# Patient Record
Sex: Female | Born: 1945
Health system: Southern US, Community
[De-identification: ages and names within clinical notes are randomized; demographics above are authoritative.]

## PROBLEM LIST (undated history)

## (undated) DIAGNOSIS — K5730 Diverticulosis of large intestine without perforation or abscess without bleeding: Secondary | ICD-10-CM

## (undated) DIAGNOSIS — K55039 Acute (reversible) ischemia of large intestine, extent unspecified: Secondary | ICD-10-CM

## (undated) DIAGNOSIS — F209 Schizophrenia, unspecified: Secondary | ICD-10-CM

## (undated) DIAGNOSIS — I2510 Atherosclerotic heart disease of native coronary artery without angina pectoris: Secondary | ICD-10-CM

## (undated) DIAGNOSIS — F329 Major depressive disorder, single episode, unspecified: Secondary | ICD-10-CM

## (undated) DIAGNOSIS — I951 Orthostatic hypotension: Secondary | ICD-10-CM

## (undated) DIAGNOSIS — I739 Peripheral vascular disease, unspecified: Secondary | ICD-10-CM

## (undated) DIAGNOSIS — I10 Essential (primary) hypertension: Secondary | ICD-10-CM

## (undated) DIAGNOSIS — E782 Mixed hyperlipidemia: Secondary | ICD-10-CM

## (undated) DIAGNOSIS — F32A Depression, unspecified: Secondary | ICD-10-CM

## (undated) DIAGNOSIS — E669 Obesity, unspecified: Secondary | ICD-10-CM

## (undated) DIAGNOSIS — E119 Type 2 diabetes mellitus without complications: Secondary | ICD-10-CM

## (undated) DIAGNOSIS — I7090 Unspecified atherosclerosis: Secondary | ICD-10-CM

## (undated) DIAGNOSIS — I493 Ventricular premature depolarization: Secondary | ICD-10-CM

## (undated) DIAGNOSIS — K559 Vascular disorder of intestine, unspecified: Secondary | ICD-10-CM

## (undated) DIAGNOSIS — I509 Heart failure, unspecified: Secondary | ICD-10-CM

## (undated) DIAGNOSIS — E039 Hypothyroidism, unspecified: Secondary | ICD-10-CM

## (undated) DIAGNOSIS — I340 Nonrheumatic mitral (valve) insufficiency: Secondary | ICD-10-CM

## (undated) DIAGNOSIS — M1990 Unspecified osteoarthritis, unspecified site: Secondary | ICD-10-CM

## (undated) DIAGNOSIS — T81710A Complication of mesenteric artery following a procedure, not elsewhere classified, initial encounter: Secondary | ICD-10-CM

## (undated) DIAGNOSIS — D369 Benign neoplasm, unspecified site: Secondary | ICD-10-CM

## (undated) DIAGNOSIS — F419 Anxiety disorder, unspecified: Secondary | ICD-10-CM

## (undated) DIAGNOSIS — G4730 Sleep apnea, unspecified: Secondary | ICD-10-CM

## (undated) DIAGNOSIS — I219 Acute myocardial infarction, unspecified: Secondary | ICD-10-CM

## (undated) HISTORY — PX: NECK SURGERY: SHX720

## (undated) HISTORY — PX: BACK SURGERY: SHX140

## (undated) HISTORY — DX: Atherosclerotic heart disease of native coronary artery without angina pectoris: I25.10

## (undated) HISTORY — DX: Complication of mesenteric artery following a procedure, not elsewhere classified, initial encounter: T81.710A

## (undated) HISTORY — PX: CARPAL TUNNEL RELEASE: SHX101

## (undated) HISTORY — DX: Obesity, unspecified: E66.9

## (undated) HISTORY — DX: Acute (reversible) ischemia of large intestine, extent unspecified: K55.039

## (undated) HISTORY — DX: Essential (primary) hypertension: I10

## (undated) HISTORY — DX: Depression, unspecified: F32.A

## (undated) HISTORY — DX: Ventricular premature depolarization: I49.3

## (undated) HISTORY — DX: Mixed hyperlipidemia: E78.2

## (undated) HISTORY — DX: Vascular disorder of intestine, unspecified: K55.9

## (undated) HISTORY — PX: CORONARY ARTERY BYPASS GRAFT: SHX141

## (undated) HISTORY — PX: OTHER SURGICAL HISTORY: SHX169

## (undated) HISTORY — DX: Diverticulosis of large intestine without perforation or abscess without bleeding: K57.30

## (undated) HISTORY — DX: Benign neoplasm, unspecified site: D36.9

## (undated) HISTORY — DX: Hypothyroidism, unspecified: E03.9

## (undated) HISTORY — DX: Unspecified atherosclerosis: I70.90

## (undated) HISTORY — DX: Orthostatic hypotension: I95.1

## (undated) HISTORY — DX: Major depressive disorder, single episode, unspecified: F32.9

## (undated) HISTORY — DX: Anxiety disorder, unspecified: F41.9

## (undated) HISTORY — DX: Type 2 diabetes mellitus without complications: E11.9

## (undated) HISTORY — PX: PARTIAL HYSTERECTOMY: SHX80

## (undated) HISTORY — DX: Nonrheumatic mitral (valve) insufficiency: I34.0

## (undated) HISTORY — DX: Peripheral vascular disease, unspecified: I73.9

## (undated) HISTORY — DX: Heart failure, unspecified: I50.9

---

## 1988-04-30 DIAGNOSIS — I219 Acute myocardial infarction, unspecified: Secondary | ICD-10-CM

## 1988-04-30 HISTORY — DX: Acute myocardial infarction, unspecified: I21.9

## 2001-08-20 ENCOUNTER — Ambulatory Visit (HOSPITAL_COMMUNITY): Admission: RE | Admit: 2001-08-20 | Discharge: 2001-08-20 | Payer: Self-pay | Admitting: Pediatrics

## 2001-08-20 ENCOUNTER — Encounter: Payer: Self-pay | Admitting: Pediatrics

## 2002-03-12 ENCOUNTER — Encounter: Payer: Self-pay | Admitting: Internal Medicine

## 2002-03-12 ENCOUNTER — Inpatient Hospital Stay (HOSPITAL_COMMUNITY): Admission: AD | Admit: 2002-03-12 | Discharge: 2002-03-14 | Payer: Self-pay | Admitting: Pediatrics

## 2002-07-02 ENCOUNTER — Ambulatory Visit (HOSPITAL_COMMUNITY): Admission: RE | Admit: 2002-07-02 | Discharge: 2002-07-02 | Payer: Self-pay | Admitting: *Deleted

## 2002-07-14 ENCOUNTER — Ambulatory Visit (HOSPITAL_COMMUNITY): Admission: RE | Admit: 2002-07-14 | Discharge: 2002-07-14 | Payer: Self-pay | Admitting: *Deleted

## 2002-07-15 ENCOUNTER — Ambulatory Visit (HOSPITAL_COMMUNITY): Admission: RE | Admit: 2002-07-15 | Discharge: 2002-07-17 | Payer: Self-pay | Admitting: Cardiology

## 2002-07-20 ENCOUNTER — Encounter (HOSPITAL_COMMUNITY): Admission: RE | Admit: 2002-07-20 | Discharge: 2002-08-19 | Payer: Self-pay | Admitting: *Deleted

## 2002-08-04 ENCOUNTER — Encounter (HOSPITAL_COMMUNITY): Admission: RE | Admit: 2002-08-04 | Discharge: 2002-09-03 | Payer: Self-pay | Admitting: *Deleted

## 2002-09-04 ENCOUNTER — Encounter (HOSPITAL_COMMUNITY): Admission: RE | Admit: 2002-09-04 | Discharge: 2002-10-04 | Payer: Self-pay | Admitting: *Deleted

## 2003-08-09 ENCOUNTER — Encounter (HOSPITAL_COMMUNITY): Admission: RE | Admit: 2003-08-09 | Discharge: 2003-09-08 | Payer: Self-pay | Admitting: Pediatrics

## 2003-09-16 ENCOUNTER — Inpatient Hospital Stay (HOSPITAL_COMMUNITY): Admission: EM | Admit: 2003-09-16 | Discharge: 2003-09-20 | Payer: Self-pay | Admitting: Emergency Medicine

## 2003-12-08 ENCOUNTER — Ambulatory Visit (HOSPITAL_COMMUNITY): Admission: RE | Admit: 2003-12-08 | Discharge: 2003-12-08 | Payer: Self-pay | Admitting: Family Medicine

## 2004-06-13 ENCOUNTER — Emergency Department (HOSPITAL_COMMUNITY): Admission: EM | Admit: 2004-06-13 | Discharge: 2004-06-13 | Payer: Self-pay | Admitting: Emergency Medicine

## 2004-06-14 ENCOUNTER — Ambulatory Visit (HOSPITAL_COMMUNITY): Admission: RE | Admit: 2004-06-14 | Discharge: 2004-06-15 | Payer: Self-pay | Admitting: Pediatrics

## 2004-06-16 ENCOUNTER — Ambulatory Visit: Payer: Self-pay | Admitting: *Deleted

## 2004-06-23 ENCOUNTER — Ambulatory Visit: Payer: Self-pay | Admitting: *Deleted

## 2004-06-29 ENCOUNTER — Ambulatory Visit: Payer: Self-pay | Admitting: Cardiology

## 2004-06-29 ENCOUNTER — Ambulatory Visit (HOSPITAL_COMMUNITY): Admission: RE | Admit: 2004-06-29 | Discharge: 2004-06-29 | Payer: Self-pay | Admitting: *Deleted

## 2004-07-03 ENCOUNTER — Ambulatory Visit: Payer: Self-pay | Admitting: *Deleted

## 2004-10-02 ENCOUNTER — Ambulatory Visit (HOSPITAL_COMMUNITY): Admission: RE | Admit: 2004-10-02 | Discharge: 2004-10-02 | Payer: Self-pay | Admitting: Pediatrics

## 2005-08-14 ENCOUNTER — Ambulatory Visit: Payer: Self-pay | Admitting: *Deleted

## 2005-09-10 ENCOUNTER — Inpatient Hospital Stay (HOSPITAL_COMMUNITY): Admission: EM | Admit: 2005-09-10 | Discharge: 2005-09-18 | Payer: Self-pay | Admitting: Emergency Medicine

## 2005-09-11 ENCOUNTER — Ambulatory Visit: Payer: Self-pay | Admitting: Pulmonary Disease

## 2005-09-11 ENCOUNTER — Ambulatory Visit: Payer: Self-pay | Admitting: *Deleted

## 2005-09-11 ENCOUNTER — Encounter: Payer: Self-pay | Admitting: Critical Care Medicine

## 2005-09-11 ENCOUNTER — Ambulatory Visit: Payer: Self-pay | Admitting: Internal Medicine

## 2005-09-11 DIAGNOSIS — K55039 Acute (reversible) ischemia of large intestine, extent unspecified: Secondary | ICD-10-CM

## 2005-09-11 HISTORY — DX: Acute (reversible) ischemia of large intestine, extent unspecified: K55.039

## 2005-09-14 ENCOUNTER — Encounter (INDEPENDENT_AMBULATORY_CARE_PROVIDER_SITE_OTHER): Payer: Self-pay | Admitting: *Deleted

## 2005-09-14 HISTORY — PX: COLONOSCOPY: SHX174

## 2005-09-18 ENCOUNTER — Ambulatory Visit: Payer: Self-pay | Admitting: Internal Medicine

## 2005-11-16 ENCOUNTER — Ambulatory Visit: Payer: Self-pay | Admitting: Internal Medicine

## 2005-11-19 ENCOUNTER — Ambulatory Visit: Admission: RE | Admit: 2005-11-19 | Discharge: 2005-11-19 | Payer: Self-pay | Admitting: Pulmonary Disease

## 2005-11-23 ENCOUNTER — Ambulatory Visit: Payer: Self-pay | Admitting: Pulmonary Disease

## 2006-03-14 ENCOUNTER — Ambulatory Visit: Payer: Self-pay | Admitting: Cardiology

## 2006-03-28 ENCOUNTER — Ambulatory Visit: Payer: Self-pay | Admitting: Cardiology

## 2006-04-03 ENCOUNTER — Ambulatory Visit: Payer: Self-pay | Admitting: Cardiology

## 2006-04-03 ENCOUNTER — Encounter (HOSPITAL_COMMUNITY): Admission: RE | Admit: 2006-04-03 | Discharge: 2006-04-29 | Payer: Self-pay | Admitting: Cardiology

## 2006-04-03 ENCOUNTER — Ambulatory Visit: Payer: Self-pay | Admitting: *Deleted

## 2006-04-09 ENCOUNTER — Ambulatory Visit: Payer: Self-pay | Admitting: Cardiology

## 2006-09-05 ENCOUNTER — Emergency Department (HOSPITAL_COMMUNITY): Admission: EM | Admit: 2006-09-05 | Discharge: 2006-09-05 | Payer: Self-pay | Admitting: Emergency Medicine

## 2006-12-02 ENCOUNTER — Emergency Department (HOSPITAL_COMMUNITY): Admission: EM | Admit: 2006-12-02 | Discharge: 2006-12-02 | Payer: Self-pay | Admitting: Emergency Medicine

## 2007-08-05 ENCOUNTER — Ambulatory Visit (HOSPITAL_COMMUNITY): Admission: RE | Admit: 2007-08-05 | Discharge: 2007-08-05 | Payer: Self-pay | Admitting: Pediatrics

## 2007-11-19 ENCOUNTER — Encounter: Payer: Self-pay | Admitting: Cardiology

## 2007-12-03 ENCOUNTER — Encounter: Payer: Self-pay | Admitting: Cardiology

## 2008-01-07 ENCOUNTER — Ambulatory Visit: Payer: Self-pay | Admitting: Cardiology

## 2008-01-14 ENCOUNTER — Ambulatory Visit: Payer: Self-pay | Admitting: Cardiology

## 2008-07-01 ENCOUNTER — Ambulatory Visit: Payer: Self-pay | Admitting: Cardiology

## 2008-10-06 ENCOUNTER — Encounter: Payer: Self-pay | Admitting: Cardiology

## 2009-01-19 DIAGNOSIS — I2510 Atherosclerotic heart disease of native coronary artery without angina pectoris: Secondary | ICD-10-CM | POA: Insufficient documentation

## 2009-01-19 DIAGNOSIS — E785 Hyperlipidemia, unspecified: Secondary | ICD-10-CM

## 2009-01-19 DIAGNOSIS — I10 Essential (primary) hypertension: Secondary | ICD-10-CM | POA: Insufficient documentation

## 2009-02-11 ENCOUNTER — Encounter (INDEPENDENT_AMBULATORY_CARE_PROVIDER_SITE_OTHER): Payer: Self-pay | Admitting: *Deleted

## 2009-02-11 ENCOUNTER — Ambulatory Visit: Payer: Self-pay | Admitting: Cardiology

## 2009-02-11 ENCOUNTER — Encounter: Payer: Self-pay | Admitting: Physician Assistant

## 2009-02-11 DIAGNOSIS — Z720 Tobacco use: Secondary | ICD-10-CM | POA: Insufficient documentation

## 2009-02-11 DIAGNOSIS — F17200 Nicotine dependence, unspecified, uncomplicated: Secondary | ICD-10-CM

## 2009-03-02 ENCOUNTER — Ambulatory Visit: Payer: Self-pay | Admitting: Cardiology

## 2009-03-03 ENCOUNTER — Encounter: Payer: Self-pay | Admitting: Cardiology

## 2009-03-18 ENCOUNTER — Ambulatory Visit: Payer: Self-pay | Admitting: Cardiology

## 2009-03-18 DIAGNOSIS — I080 Rheumatic disorders of both mitral and aortic valves: Secondary | ICD-10-CM | POA: Insufficient documentation

## 2009-05-03 ENCOUNTER — Encounter: Payer: Self-pay | Admitting: Cardiology

## 2009-05-04 ENCOUNTER — Encounter: Payer: Self-pay | Admitting: Cardiology

## 2009-05-06 ENCOUNTER — Ambulatory Visit: Payer: Self-pay | Admitting: Cardiology

## 2009-05-06 ENCOUNTER — Encounter (INDEPENDENT_AMBULATORY_CARE_PROVIDER_SITE_OTHER): Payer: Self-pay | Admitting: *Deleted

## 2009-05-06 DIAGNOSIS — R0609 Other forms of dyspnea: Secondary | ICD-10-CM

## 2009-05-10 ENCOUNTER — Encounter: Payer: Self-pay | Admitting: Cardiology

## 2009-05-13 ENCOUNTER — Inpatient Hospital Stay (HOSPITAL_BASED_OUTPATIENT_CLINIC_OR_DEPARTMENT_OTHER): Admission: RE | Admit: 2009-05-13 | Discharge: 2009-05-13 | Payer: Self-pay | Admitting: Cardiology

## 2009-05-13 ENCOUNTER — Ambulatory Visit: Payer: Self-pay | Admitting: Cardiology

## 2009-05-13 ENCOUNTER — Encounter: Payer: Self-pay | Admitting: Cardiology

## 2009-05-18 ENCOUNTER — Ambulatory Visit: Payer: Self-pay | Admitting: Cardiovascular Disease

## 2009-05-18 ENCOUNTER — Observation Stay (HOSPITAL_COMMUNITY): Admission: RE | Admit: 2009-05-18 | Discharge: 2009-05-19 | Payer: Self-pay | Admitting: Cardiovascular Disease

## 2009-05-18 ENCOUNTER — Encounter: Payer: Self-pay | Admitting: Cardiology

## 2009-05-19 ENCOUNTER — Encounter: Payer: Self-pay | Admitting: Cardiology

## 2009-06-23 ENCOUNTER — Ambulatory Visit: Payer: Self-pay | Admitting: Cardiology

## 2009-10-04 ENCOUNTER — Ambulatory Visit: Payer: Self-pay | Admitting: Cardiology

## 2009-10-04 DIAGNOSIS — I951 Orthostatic hypotension: Secondary | ICD-10-CM | POA: Insufficient documentation

## 2009-10-12 ENCOUNTER — Encounter: Payer: Self-pay | Admitting: Cardiology

## 2009-10-13 ENCOUNTER — Ambulatory Visit: Payer: Self-pay | Admitting: Physician Assistant

## 2009-10-13 DIAGNOSIS — I495 Sick sinus syndrome: Secondary | ICD-10-CM | POA: Insufficient documentation

## 2009-10-13 HISTORY — DX: Sick sinus syndrome: I49.5

## 2009-12-06 ENCOUNTER — Encounter: Payer: Self-pay | Admitting: Cardiology

## 2010-04-09 ENCOUNTER — Encounter: Payer: Self-pay | Admitting: Cardiology

## 2010-04-10 ENCOUNTER — Encounter: Payer: Self-pay | Admitting: Cardiology

## 2010-04-11 ENCOUNTER — Ambulatory Visit: Payer: Self-pay | Admitting: Cardiology

## 2010-04-12 ENCOUNTER — Encounter: Payer: Self-pay | Admitting: Cardiology

## 2010-05-04 ENCOUNTER — Ambulatory Visit
Admission: RE | Admit: 2010-05-04 | Discharge: 2010-05-04 | Payer: Self-pay | Source: Home / Self Care | Attending: Cardiology | Admitting: Cardiology

## 2010-06-01 NOTE — Letter (Signed)
Summary: External Correspondence/ OFFICE NOTE [facility]  External Correspondence/ OFFICE NOTE [facility]   Imported By: Dorise Hiss 12/13/2009 12:40:28  _____________________________________________________________________  External Attachment:    Type:   Image     Comment:   External Document

## 2010-06-01 NOTE — Assessment & Plan Note (Signed)
Summary: 1 WK F/U PER 6/7 OV-JM  Medications Added ASPIRIN 81 MG TBEC (ASPIRIN) Take one tablet by mouth daily GLYBURIDE 5 MG TABS (GLYBURIDE) Take 2 tablet by mouth twice a day      Allergies Added: NKDA  Visit Type:  Follow-up Primary Provider:  Dr. [name]   History of Present Illness: patient returns for scheduled early followup. She had recent adjustment of her medications, in light of documented orthostatic hypotension and complaint of dizziness. Norvasc was decreased to 5 mg daily, and patient was advised to take Lasix on an as-needed basis.   Follow labs drawn yesterday notable for potassium 4.5, creatinine 0.9, and normal CBC.  Clinically, patient reports complete resolution of her dizziness. In fact, she had no difficulty ambulating from her car to the office today, as she had last week. She continues to report no angina, since undergoing percutaneous intervention in January of this year. She is on Plavix and full dose aspirin, and does report easy bruisability.  Preventive Screening-Counseling & Management  Alcohol-Tobacco     Smoking Status: current     Smoking Cessation Counseling: yes     Year Quit: <1/3 PPD  Current Medications (verified): 1)  K-Tabs 10 Meq Cr-Tabs (Potassium Chloride) .... Take 1 Tablet By Mouth Once A Day As Needed With Lasix 2)  Lasix 40 Mg Tabs (Furosemide) .... Take 1 Tablet By Mouth Once A Day Use As Needed Only 3)  Aspirin 325 Mg Tabs (Aspirin) .... Take 1 Tablet By Mouth Once A Day 4)  Glyburide 5 Mg Tabs (Glyburide) .... Take 2 Tablet By Mouth Twice A Day 5)  Metoprolol Tartrate 50 Mg Tabs (Metoprolol Tartrate) .... Take 1 Tablet By Mouth Two Times A Day 6)  Pravastatin Sodium 80 Mg Tabs (Pravastatin Sodium) .... Take 1 Tablet By Mouth Once A Day 7)  Alprazolam 2 Mg Tabs (Alprazolam) .... Take 1 Tablet By Mouth Three Times A Day 8)  Constulose 10 Gm/24ml Soln (Lactulose) .... Use As Directed 9)  Hydrocodone-Acetaminophen 7.5-750 Mg  Tabs (Hydrocodone-Acetaminophen) .... Take 1 Tablet By Mouth Four Times A Day As Needed 10)  Amitriptyline Hcl 100 Mg Tabs (Amitriptyline Hcl) .... Take 2 Tablet By Mouth Once A Day At Bedtime 11)  Amlodipine Besylate 10 Mg Tabs (Amlodipine Besylate) .... Take 1/2 Tablet By Mouth Daily 12)  Isosorbide Mononitrate Cr 30 Mg Xr24h-Tab (Isosorbide Mononitrate) .... Take One Tablet By Mouth Daily 13)  Plavix 75 Mg Tabs (Clopidogrel Bisulfate) .... Take 1 Tablet By Mouth Once A Day 14)  Wellbutrin Sr 150 Mg Xr12h-Tab (Bupropion Hcl) .... Take 1 Tablet By Mouth Two Times A Day As Directed 15)  Gas-X Extra Strength 125 Mg Caps (Simethicone) .... As Needed  Allergies (verified): No Known Drug Allergies  Comments:  Nurse/Medical Assistant: The patient's medication list and allergies were reviewed with the patient and were updated in the Medication and Allergy Lists.  Past History:  Past Medical History: Mitral regurgitation, mild to moderate Diabetes mellitus Obesity Peripheral arterial disease History of ischemic colitis CAD - multivessel, LVEF 50-55%, DES circ 1/11, occluded SVG to OM and SVG to diagonal, LVEF 60% Hyperlipidemia Hypertension Orthostatic hypotension  Review of Systems       No fevers, chills, hemoptysis, dysphagia, melena, hematocheezia, hematuria, rash, claudication, orthopnea, pnd, pedal edema. All other systems negative.   Vital Signs:  Patient profile:   65 year old female Height:      64 inches Weight:      271 pounds Pulse  rate:   54 / minute BP sitting:   109 / 66  (left arm) Cuff size:   large  Vitals Entered By: Carlye Grippe (October 13, 2009 1:55 PM)  Physical Exam  Additional Exam:  GEN:65 year old female, morbidly obese, sitting upright, in no distress HEENT: NCAT,PERRLA,EOMI NECK: unable to assess JVD, secondary to neck girth; no bruits LUNGS: CTA bilaterally HEART: RRR (S1S2); no significant murmurs; no rubs; no gallops ABD: protuberant, soft,  NT; intact BS EXT: trace pedal edema SKIN: warm, dry MUSC: no obvious deformity NEURO: A/O (x3)     Impression & Recommendations:  Problem # 1:  ORTHOSTATIC HYPOTENSION (ICD-458.0) since resolved, following decrease in amlodipine and discontinuation of daily Lasix. Followup labs are within normal limits. No further medication adjustments indicated today. we'll schedule a return clinic visit with myself and Dr. Diona Browner in 6 months, or sooner if needed.  Problem # 2:  CAD, NATIVE VESSEL (ICD-414.01) quiesced on current medication regimen. Patient is to remain on Plavix for at least one year. We'll cut back on full dose aspirin to 81 mg daily, in light of reported easy bruisability.  Problem # 3:  SINUS BRADYCARDIA (ICD-427.81) patient is on metoprolol as the only agent affecting her heart rate. We'll continue current dose for now, but may need to decrease this in the future, if patient were to complain of recurrent dizziness with documented low heart rates.  Problem # 4:  TOBACCO USER (ICD-305.1) patient was again counseled regarding smoking cessation.  Problem # 5:  HYPERLIPIDEMIA-MIXED (ICD-272.4) aggressive management recommended with target LDL of 70 or less, if feasible. we'll request most recent lipid profile from Dr. Webb Laws office.  Patient Instructions: 1)  Your physician wants you to follow-up in: 6 months. You will receive a reminder letter in the mail one-two months in advance. If you don't receive a letter, please call our office to schedule the follow-up appointment. 2)  Decrease Aspirin to 81mg  by mouth once daily.  Appended Document: 1 WK F/U PER 6/7 OV-JM Spoke with Patty at Dr. Webb Laws office. Most recent FLP was done January 2011. A copy of this report is in EMR.

## 2010-06-01 NOTE — Consult Note (Signed)
Summary: CARDIOLOGY CONSULT/ [facility]  CARDIOLOGY CONSULT/ [facility]   Imported By: Zachary George 05/04/2010 09:45:57  _____________________________________________________________________  External Attachment:    Type:   Image     Comment:   External Document

## 2010-06-01 NOTE — Assessment & Plan Note (Signed)
Summary: [facility] POST OP CATH  Medications Added GLYBURIDE 5 MG TABS (GLYBURIDE) Take 2 tablet by mouth once a day PLAVIX 75 MG TABS (CLOPIDOGREL BISULFATE) Take 1 tablet by mouth once a day WELLBUTRIN SR 150 MG XR12H-TAB (BUPROPION HCL) Take 1 tablet by mouth two times a day as directed NICOTINE 21-14-7 MG/24HR KIT (NICOTINE) over-the counter patches as directed      Allergies Added: NKDA  Visit Type:  Follow-up Primary Provider:  Dr. [name]   History of Present Illness: 65 year old woman presents for a post cardiac catheterization followup. She recently underwent placement of a drug-eluting stent to the circumflex and obtuse marginal system in January by Dr. Clifton James, with findings of an occluded SVG to OM and occluded SVG to diagonal, with patent SVG to PDA and patent LIMA to LAD. LVEF was 60%. Otherwise medical therapy was recommended.  The patient had several questions about her cardiac catheterization procedure and findings. We reviewed these in great detail today. She was stuck on the fact that she was apparently told at [facility] that she had one stent placed in her circumflex, when in fact she had two based on our cardiac catheterization findings.   Ms. Finfrock continues to smoke cigarettes. I discussed with her the great importance of smoking cessation. We outlined strategies to consider.  We also reviewed the importance of medical therapy, and the natural progression of cardiovascular disease over time.   Current Medications (verified): 1)  K-Tabs 10 Meq Cr-Tabs (Potassium Chloride) .... Take 1 Tablet By Mouth Once A Day 2)  Lasix 40 Mg Tabs (Furosemide) .... Take 1 Tablet By Mouth Once A Day 3)  Aspirin 325 Mg Tabs (Aspirin) .... Take 1 Tablet By Mouth Once A Day 4)  Glyburide 5 Mg Tabs (Glyburide) .... Take 2 Tablet By Mouth Once A Day 5)  Metoprolol Tartrate 50 Mg Tabs (Metoprolol Tartrate) .... Take 1 Tablet By Mouth Two Times A Day 6)  Pravastatin Sodium 80 Mg Tabs  (Pravastatin Sodium) .... Take 1 Tablet By Mouth Once A Day 7)  Alprazolam 2 Mg Tabs (Alprazolam) .... Take 1 Tablet By Mouth Three Times A Day 8)  Constulose 10 Gm/61ml Soln (Lactulose) .... Use As Directed 9)  Hydrocodone-Acetaminophen 7.5-750 Mg Tabs (Hydrocodone-Acetaminophen) .... Take 1 Tablet By Mouth Four Times A Day As Needed 10)  Amitriptyline Hcl 100 Mg Tabs (Amitriptyline Hcl) .... Take 2 Tablet By Mouth Once A Day At Bedtime 11)  Amlodipine Besylate 10 Mg Tabs (Amlodipine Besylate) .... Take One Tablet By Mouth Daily 12)  Prevacid 30 Mg Cpdr (Lansoprazole) .... Take 1 Tablet By Mouth Once A Day 13)  Isosorbide Mononitrate Cr 30 Mg Xr24h-Tab (Isosorbide Mononitrate) .... Take One Tablet By Mouth Daily 14)  Plavix 75 Mg Tabs (Clopidogrel Bisulfate) .... Take 1 Tablet By Mouth Once A Day 15)  Wellbutrin Sr 150 Mg Xr12h-Tab (Bupropion Hcl) .... Take 1 Tablet By Mouth Two Times A Day As Directed  Allergies (verified): No Known Drug Allergies  Comments:  Nurse/Medical Assistant: The patient's medications and allergies were reviewed with the patient and were updated in the Medication and Allergy Lists. Bottles reviewed.  Past History:  Past Medical History: Last updated: 06/22/2009 Mitral regurgitation, mild to moderate Diabetes mellitus Obesity Peripheral arterial disease History of ischemic colitis CAD - multivessel, LVEF 50-55%, DES circ 1/11, occluded SVG to OM and SVG to diagonal, LVEF 60% Hyperlipidemia Hypertension  Past Surgical History: Last updated: 05/06/2009 Neck surgery Carpal tunnel surgery Partial hysterectomy Back Surgery CABG -  LIMA-LAD; SVG-RCA; SVG-OM; SVG-DX in [facility]) Carotid Endarterectomy  Social History: Last updated: 01/19/2009 Tobacco Use - Yes.  Disabled  Widowed  Alcohol Use - no Regular Exercise - yes Drug Use - no  Review of Systems       The patient complains of dyspnea on exertion.  The patient denies anorexia, fever, chest  pain, syncope, headaches, hemoptysis, melena, and hematochezia.         She complains of chronic arthritic back pain that limits her ambulation. No groin site complications. Otherwise reviewed and negative.  Vital Signs:  Patient profile:   65 year old female Height:      64 inches Weight:      274 pounds O2 Sat:      95 % Pulse rate:   78 / minute BP sitting:   113 / 66  (left arm) Cuff size:   large  Vitals Entered By: Carlye Grippe (June 23, 2009 3:59 PM)  Physical Exam  Additional Exam:  Morbidly obese woman in no acute distress. HEENT: Conjunctiva and lids normal, oropharynx with moist mucosa. Neck: Supple, no carotid bruits, no elevated jugular venous pressure. Lungs: Diminished breath sounds, nonlabored. No wheezing. Cardiac: Regular rate and rhythm, soft systolic murmur. No S3. Abdomen: Obese, nontender, no bruits. Skin: Warm and dry. Extremities: 1+ chronic appearing edema below the knees. Musculoskeletal: No gross deformities. Neuropsychiatric: Alert and oriented x3, affect appropriate.   EKG  Procedure date:  06/23/2009  Findings:      Normal sinus rhythm at 72 beats per minute with nonspecific ST-T wave changes.  Impression & Recommendations:  Problem # 1:  CAD, NATIVE VESSEL (ICD-414.01)  Multivessel cardiovascular disease with bypass graft disease as outlined above, now status post placement of a drug-eluting stent within the circumflex/OM system. Otherwise medical therapy is planned at this time. She is not reporting any anginal symptoms. We discussed the importance of weight loss, smoking cessation, medical therapy, and exercise. I plan to see her back over the next 3 months.  Her updated medication list for this problem includes:    Aspirin 325 Mg Tabs (Aspirin) .Marland Kitchen... Take 1 tablet by mouth once a day    Metoprolol Tartrate 50 Mg Tabs (Metoprolol tartrate) .Marland Kitchen... Take 1 tablet by mouth two times a day    Amlodipine Besylate 10 Mg Tabs (Amlodipine  besylate) .Marland Kitchen... Take one tablet by mouth daily    Isosorbide Mononitrate Cr 30 Mg Xr24h-tab (Isosorbide mononitrate) .Marland Kitchen... Take one tablet by mouth daily    Plavix 75 Mg Tabs (Clopidogrel bisulfate) .Marland Kitchen... Take 1 tablet by mouth once a day  Orders: EKG w/ Interpretation (93000)  Problem # 2:  TOBACCO USER (ICD-305.1)  Today we discussed the critical importance of smoking cessation. I have encouraged her to consider the matter carefully and pick a quit date. 2 weeks prior to this date she will begin Wellbutrin, and then use nicotine patches starting on the day that she stops.  Problem # 3:  HYPERTENSION, UNSPECIFIED (ICD-401.9)  Blood pressure well controlled today.  Her updated medication list for this problem includes:    Lasix 40 Mg Tabs (Furosemide) .Marland Kitchen... Take 1 tablet by mouth once a day    Aspirin 325 Mg Tabs (Aspirin) .Marland Kitchen... Take 1 tablet by mouth once a day    Metoprolol Tartrate 50 Mg Tabs (Metoprolol tartrate) .Marland Kitchen... Take 1 tablet by mouth two times a day    Amlodipine Besylate 10 Mg Tabs (Amlodipine besylate) .Marland Kitchen... Take one tablet by mouth daily  Problem # 4:  HYPERLIPIDEMIA-MIXED (ICD-272.4)  Will check a fasting lipid profile and liver function tests prior to her next visit.  Her updated medication list for this problem includes:    Pravastatin Sodium 80 Mg Tabs (Pravastatin sodium) .Marland Kitchen... Take 1 tablet by mouth once a day  Patient Instructions: 1)  Pick a day to stop smoking. Start Wellbutrin 150mg  by mouth two times a day 2 weeks before this date. On your quit day, continue wellbutrin and start the over-the-counter patches. These will be graded and you will start with 21mg  and progress to 14mg  then 7mg  as directed.  2)  Your physician recommends that you go to the [facility] for a FASTING lipid profile and liver function labs: in 3 months before next appointment. 3)  Your physician wants you to follow-up in: 3 months. You will receive a reminder letter in the mail one-two  months in advance. If you don't receive a letter, please call our office to schedule the follow-up appointment. Prescriptions: WELLBUTRIN SR 150 MG XR12H-TAB (BUPROPION HCL) Take 1 tablet by mouth two times a day as directed  #60 x 1   Entered by:   Cyril Loosen, RN, BSN   Authorized by:   Loreli Slot, MD, [facility]   Signed by:   Cyril Loosen, RN, BSN on 06/23/2009   Method used:   Electronically to        [facility] (retail)       [address]       [place], [place]  [address]       Ph: [phone]       Fax: [phone]   RxID:   [id number]   Handout requested.

## 2010-06-01 NOTE — Assessment & Plan Note (Signed)
Summary: 1 MO -SRS  Medications Added PREVACID 30 MG CPDR (LANSOPRAZOLE) Take 1 tablet by mouth once a day      Allergies Added: NKDA  Visit Type:  Follow-up Primary Provider:  Dr. [name]   History of Present Illness: 65 year old woman presents for followup of her daughter. She was last seen in the office back in November and follow up with cardiac testing, with known multivessel coronary artery disease status post remote coronary artery bypass grafting at [facility] Medication adjustments were made.  [name] continues to describe dyspnea on exertion at NYHA class III, with intermittent chest pain, and also increased reflux symptoms. She remains very worried about the status of her heart. She and her daughter have discussed the matter, and we reviewed the risks and benefits of a diagnostic cardiac catheterization to best understand her coronary anatomy. We also discussed the fact that her increased weight may also be leading to symptoms.  To review, echocardiography revealed normal left ventricular systolic function, and a Lexus scan Cardiolite did not indicate any clear ischemia, with small anterior defect that was most suggestive of attenuation. She did have mild to moderate mitral regurgitation with an RVSP of 33 mm mercury.  Preventive Screening-Counseling & Management  Alcohol-Tobacco     Smoking Status: current     Smoking Cessation Counseling: yes     Packs/Day: 1/2 PPD  Current Medications (verified): 1)  K-Tabs 10 Meq Cr-Tabs (Potassium Chloride) .... Take 1 Tablet By Mouth Once A Day 2)  Lasix 40 Mg Tabs (Furosemide) .... Take 1 Tablet By Mouth Once A Day 3)  Aspirin 325 Mg Tabs (Aspirin) .... Take 1 Tablet By Mouth Once A Day 4)  Glyburide 5 Mg Tabs (Glyburide) .... Take 1 Tablet By Mouth Once A Day 5)  Metoprolol Tartrate 50 Mg Tabs (Metoprolol Tartrate) .... Take 1 Tablet By Mouth Two Times A Day 6)  Pravastatin Sodium 80 Mg Tabs (Pravastatin Sodium) .... Take 1  Tablet By Mouth Once A Day 7)  Alprazolam 2 Mg Tabs (Alprazolam) .... Take 1 Tablet By Mouth Three Times A Day 8)  Constulose 10 Gm/88ml Soln (Lactulose) .... Use As Directed 9)  Hydrocodone-Acetaminophen 7.5-750 Mg Tabs (Hydrocodone-Acetaminophen) .... Take 1 Tablet By Mouth Four Times A Day As Needed 10)  Amitriptyline Hcl 100 Mg Tabs (Amitriptyline Hcl) .... Take 2 Tablet By Mouth Once A Day At Bedtime 11)  Amlodipine Besylate 10 Mg Tabs (Amlodipine Besylate) .... Take One Tablet By Mouth Daily 12)  Prevacid 30 Mg Cpdr (Lansoprazole) .... Take 1 Tablet By Mouth Once A Day 13)  Isosorbide Mononitrate Cr 30 Mg Xr24h-Tab (Isosorbide Mononitrate) .... Take One Tablet By Mouth Daily  Allergies (verified): No Known Drug Allergies  Comments:  Nurse/Medical Assistant: The patient's medications and allergies were reviewed with the patient and were updated in the Medication and Allergy Lists. Patient  stated nothing changed.  Past History:  Social History: Last updated: 01/19/2009 Tobacco Use - Yes.  Disabled  Widowed  Alcohol Use - no Regular Exercise - yes Drug Use - no  Past Medical History: Mitral regurgitation, mild to moderate Diabetes mellitus Obesity Peripheral arterial disease History of ischemic colitis CAD - multivessel, LVEF 50-55% Hyperlipidemia Hypertension  Past Surgical History: Neck surgery Carpal tunnel surgery Partial hysterectomy Back Surgery CABG - LIMA-LAD; SVG-RCA; SVG-OM; SVG-DX in 1995 [facility]) Carotid Endarterectomy  Family History: Family History of Diabetes, Hypertension  Social History: Packs/Day:  1/2 PPD  Review of Systems  The patient denies anorexia, fever,  hoarseness, syncope, peripheral edema, prolonged cough, headaches, hemoptysis, abdominal pain, melena, and hematochezia.         Otherwise negative except as outlined above.  Vital Signs:  Patient profile:   65 year old female Height:      64 inches Weight:      271  pounds Pulse rate:   65 / minute BP sitting:   125 / 76  (left arm) Cuff size:   large  Vitals Entered By: Carlye Grippe (May 06, 2009 10:53 AM)   Physical Exam  Additional Exam:  Morbidly obese woman in no acute distress. HEENT: Conjunctiva and lids normal, oropharynx with moist mucosa. Neck: Supple, no carotid bruits, no elevated jugular venous pressure. Lungs: Diminished breath sounds, nonlabored. No wheezing. Cardiac: Regular rate and rhythm, soft systolic murmur. No S3. Abdomen: Obese, nontender, no bruits. Skin: Warm and dry. Extremities: 1+ chronic appearing edema below the knees. Musculoskeletal: No gross deformities. Neuropsychiatric: Alert and oriented x3, affect appropriate.   Impression & Recommendations:  Problem # 1:  DYSPNEA (ICD-786.05)  Progressive shortness of breath with intermittent chest pain, in the setting of known multivessel cardiovascular disease as detailed above, normal left ventricular systolic function without frank ischemia by recent Cardiolite, mild to moderate mitral regurgitation, and morbid obesity. Medical therapy adjustments have been made with good blood pressure control, although continued symptoms, and increased concern on the part of the patient and her daughter. We discussed the risks and benefits of diagnostic cardiac catheterization (left and right heart) for further objective assessment. This will be arranged.  Her updated medication list for this problem includes:    Lasix 40 Mg Tabs (Furosemide) .Marland Kitchen... Take 1 tablet by mouth once a day    Aspirin 325 Mg Tabs (Aspirin) .Marland Kitchen... Take 1 tablet by mouth once a day    Metoprolol Tartrate 50 Mg Tabs (Metoprolol tartrate) .Marland Kitchen... Take 1 tablet by mouth two times a day    Amlodipine Besylate 10 Mg Tabs (Amlodipine besylate) .Marland Kitchen... Take one tablet by mouth daily  Problem # 2:  HYPERTENSION, UNSPECIFIED (ICD-401.9)  Blood pressure well controlled today. Continue present medications.  Her  updated medication list for this problem includes:    Lasix 40 Mg Tabs (Furosemide) .Marland Kitchen... Take 1 tablet by mouth once a day    Aspirin 325 Mg Tabs (Aspirin) .Marland Kitchen... Take 1 tablet by mouth once a day    Metoprolol Tartrate 50 Mg Tabs (Metoprolol tartrate) .Marland Kitchen... Take 1 tablet by mouth two times a day    Amlodipine Besylate 10 Mg Tabs (Amlodipine besylate) .Marland Kitchen... Take one tablet by mouth daily  Problem # 3:  CAD, NATIVE VESSEL (ICD-414.01)  Multivessel, status post coronary artery bypass grafting at [facility], anatomy outlined above.  Her updated medication list for this problem includes:    Aspirin 325 Mg Tabs (Aspirin) .Marland Kitchen... Take 1 tablet by mouth once a day    Metoprolol Tartrate 50 Mg Tabs (Metoprolol tartrate) .Marland Kitchen... Take 1 tablet by mouth two times a day    Amlodipine Besylate 10 Mg Tabs (Amlodipine besylate) .Marland Kitchen... Take one tablet by mouth daily    Isosorbide Mononitrate Cr 30 Mg Xr24h-tab (Isosorbide mononitrate) .Marland Kitchen... Take one tablet by mouth daily  Problem # 4:  TOBACCO USER (ICD-305.1)  Smoking cessation has been recommended.  Problem # 5:  MITRAL REGURGITATION (ICD-396.3)  Mild to moderate by recent echocardiogram.  Other Orders: T-Basic Metabolic Panel [phone]) T-CBC No Diff ([id number]) T-PTT ([id number]) T-Protime, Auto ([id number])  Patient Instructions: 1)  Left  and Right Heart Cath  2)  Follow up in  1 month.

## 2010-06-01 NOTE — Assessment & Plan Note (Signed)
Summary: 2 wk ful  Medications Added ALPRAZOLAM 2 MG TABS (ALPRAZOLAM) Take 1 tablet by mouth three times a day as needed HYDROCODONE-ACETAMINOPHEN 7.5-750 MG TABS (HYDROCODONE-ACETAMINOPHEN) take one by mouth every 4 hours as needed AMLODIPINE BESYLATE 5 MG TABS (AMLODIPINE BESYLATE) Take 1 tablet by mouth once a day ISOSORBIDE MONONITRATE CR 60 MG XR24H-TAB (ISOSORBIDE MONONITRATE) Take 1 tablet by mouth once a day MIRALAX  POWD (POLYETHYLENE GLYCOL 3350) 17gm mixed with 8oz water or juice by mouth daily METFORMIN HCL 500 MG TABS (METFORMIN HCL) Take 1 tablet by mouth two times a day NITROSTAT 0.4 MG SUBL (NITROGLYCERIN) use as needed chest pain      Allergies Added: NKDA  Visit Type:  Follow-up Primary Provider:  Dr. [name]   History of Present Illness: 65 year old woman presents for followup. I saw her back in June. More recently she was seen in consultation at [facility], having presented with chest pain. Discomfort had both typical and atypical features. She ruled out for myocardial infarction and was referred for a followup Cardiolite, reviewed below. Ischemic changes were noted in the setting of known CAD with graft disease and prior failed intervention to the proximal LAD. Medical therapy adjustments were made.  Prior lipid profile from August 2011 revealed total cholesterol 127, triglycerides 136, HDL 35, LDL 65.  She presents today for followup. She states that she has had additional episodes of angina, although quickly resolved with one sublingual nitroglycerin. She continues to smoke cigarettes. I reviewed her medications today, which look quite good overall, and note that her heart rate and blood pressure are well controlled at baseline.   I discussed with her the implications of her recent stress test findings, and we reviewed her prior evidence of both graft and native vessel disease. We discussed smoking cessation once again, and also the possibility of considering a  repeat diagnostic cardiac catheterization to see if there are any potential revascularization strategies to be considered, realizing that there may be no simple approaches. At this particular time, she wanted to consider the matter further and see how she does over the next few weeks.  Preventive Screening-Counseling & Management  Alcohol-Tobacco     Smoking Status: current     Smoking Cessation Counseling: yes     Packs/Day: <1 PPD  Current Medications (verified): 1)  K-Tabs 10 Meq Cr-Tabs (Potassium Chloride) .... Take 1 Tablet By Mouth Once A Day As Needed With Lasix 2)  Lasix 40 Mg Tabs (Furosemide) .... Take 1 Tablet By Mouth Once A Day Use As Needed Only 3)  Aspirin 81 Mg Tbec (Aspirin) .... Take One Tablet By Mouth Daily 4)  Glyburide 5 Mg Tabs (Glyburide) .... Take 2 Tablet By Mouth Twice A Day 5)  Metoprolol Tartrate 50 Mg Tabs (Metoprolol Tartrate) .... Take 1 Tablet By Mouth Two Times A Day 6)  Pravastatin Sodium 80 Mg Tabs (Pravastatin Sodium) .... Take 1 Tablet By Mouth Once A Day 7)  Alprazolam 2 Mg Tabs (Alprazolam) .... Take 1 Tablet By Mouth Three Times A Day As Needed 8)  Constulose 10 Gm/76ml Soln (Lactulose) .... Use As Directed 9)  Hydrocodone-Acetaminophen 7.5-750 Mg Tabs (Hydrocodone-Acetaminophen) .... Take One By Mouth Every 4 Hours As Needed 10)  Amitriptyline Hcl 100 Mg Tabs (Amitriptyline Hcl) .... Take 2 Tablet By Mouth Once A Day At Bedtime 11)  Amlodipine Besylate 5 Mg Tabs (Amlodipine Besylate) .... Take 1 Tablet By Mouth Once A Day 12)  Isosorbide Mononitrate Cr 60 Mg Xr24h-Tab (Isosorbide Mononitrate) .Marland KitchenMarland KitchenMarland Kitchen  Take 1 Tablet By Mouth Once A Day 13)  Plavix 75 Mg Tabs (Clopidogrel Bisulfate) .... Take 1 Tablet By Mouth Once A Day 14)  Gas-X Extra Strength 125 Mg Caps (Simethicone) .... As Needed 15)  Miralax  Powd (Polyethylene Glycol 3350) .Marland Kitchen.. 17gm Mixed With 8oz Water or Juice By Mouth Daily 16)  Metformin Hcl 500 Mg Tabs (Metformin Hcl) .... Take 1 Tablet By  Mouth Two Times A Day 17)  Nitrostat 0.4 Mg Subl (Nitroglycerin) .... Use As Needed Chest Pain  Allergies (verified): No Known Drug Allergies  Comments:  Nurse/Medical Assistant: The patient's medication bottles and allergies were reviewed with the patient and were updated in the Medication and Allergy Lists.  Past History:  Past Medical History: Last updated: 10/13/2009 Mitral regurgitation, mild to moderate Diabetes mellitus Obesity Peripheral arterial disease History of ischemic colitis CAD - multivessel, LVEF 50-55%, DES circ 1/11, occluded SVG to OM and SVG to diagonal, LVEF 60% Hyperlipidemia Hypertension Orthostatic hypotension  Past Surgical History: Last updated: 05/06/2009 Neck surgery Carpal tunnel surgery Partial hysterectomy Back Surgery CABG - LIMA-LAD; SVG-RCA; SVG-OM; SVG-DX in 1995 [facility]) Carotid Endarterectomy  Social History: Last updated: 01/19/2009 Tobacco Use - Yes.  Disabled  Widowed  Alcohol Use - no Regular Exercise - yes Drug Use - no  Social History: Packs/Day:  <1 PPD  Review of Systems       The patient complains of chest pain and dyspnea on exertion.  The patient denies anorexia, fever, weight loss, syncope, peripheral edema, headaches, melena, and hematochezia.         Recently hit her eye on a fireplace door with resulting ecchymosis on the right. Otherwise reviewed and negative except as outlined above.  Vital Signs:  Patient profile:   65 year old female Height:      64 inches Weight:      269 pounds BMI:     46.34 Pulse rate:   59 / minute BP sitting:   128 / 70  (left arm) Cuff size:   large  Vitals Entered By: Carlye Grippe (May 04, 2010 11:40 AM)  Nutrition Counseling: Patient's BMI is greater than 25 and therefore counseled on weight management options.  Physical Exam  Additional Exam:  Morbidly obese chronically ill-appearing woman in no acute distress. HEENT: Conjunctiva and lids are grossly normal with  ecchymosis on the right, oropharynx with poor dentition. Neck: Supple, no obvious elevation in JVP, no bruits. Lungs: Diminished breath sounds without wheezing or labored breathing. Cardiac: Regular rate and rhythm, no S3 or loud systolic murmur. Abdomen: Soft, nontender, bowel sounds present Skin: Warm and dry. Extremities: No pitting edema, distal pulses one plus. Musculoskeletal: No kyphosis. Neuropsychiatric: Alert and oriented x3, affect grossly appropriate.   Nuclear Study  Procedure date:  04/10/2010  Findings:      Lexascan Cardiolite, no diagnostic ST changes, no chest pain. Perfusion imaging reveals LVEF 69%, t.i.d. ratio 1.2, ischemia noted within the anteroapical segment and basal inferolateral segment assisted with clear component of breast attenuation.  Impression & Recommendations:  Problem # 1:  CAD, NATIVE VESSEL (ICD-414.01)  At this point plan continued medical therapy for treatment of angina with known history of graft and native vessel disease, status post most recent intervention in January of last year with drug-eluting stent placement to the circumflex. She continues on dual antiplatelet therapy. I plan to see her back over the next 4 weeks, sooner if needed.  Her updated medication list for this problem includes:  Aspirin 81 Mg Tbec (Aspirin) .Marland Kitchen... Take one tablet by mouth daily    Metoprolol Tartrate 50 Mg Tabs (Metoprolol tartrate) .Marland Kitchen... Take 1 tablet by mouth two times a day    Amlodipine Besylate 5 Mg Tabs (Amlodipine besylate) .Marland Kitchen... Take 1 tablet by mouth once a day    Isosorbide Mononitrate Cr 60 Mg Xr24h-tab (Isosorbide mononitrate) .Marland Kitchen... Take 1 tablet by mouth once a day    Plavix 75 Mg Tabs (Clopidogrel bisulfate) .Marland Kitchen... Take 1 tablet by mouth once a day    Nitrostat 0.4 Mg Subl (Nitroglycerin) ..... Use as needed chest pain  Problem # 2:  TOBACCO USER (ICD-305.1)  I again discussed the critical importance of smoking cessation for her  health.  Problem # 3:  HYPERTENSION, UNSPECIFIED (ICD-401.9)  Blood pressure well controlled today.  Her updated medication list for this problem includes:    Lasix 40 Mg Tabs (Furosemide) .Marland Kitchen... Take 1 tablet by mouth once a day use as needed only    Aspirin 81 Mg Tbec (Aspirin) .Marland Kitchen... Take one tablet by mouth daily    Metoprolol Tartrate 50 Mg Tabs (Metoprolol tartrate) .Marland Kitchen... Take 1 tablet by mouth two times a day    Amlodipine Besylate 5 Mg Tabs (Amlodipine besylate) .Marland Kitchen... Take 1 tablet by mouth once a day  Problem # 4:  MITRAL REGURGITATION (ICD-396.3)  Mild to moderate.  Patient Instructions: 1)  Your physician recommends that you continue on your current medications as directed. Please refer to the Current Medication list given to you today. 2)  Follow up in  1 month

## 2010-06-01 NOTE — Letter (Signed)
Summary: [facility] D[facility]C DR. Pleas Patricia  [facility] D[facility]C DR. Casimer Bilis [place]   Imported By: Zachary George 05/04/2010 09:40:38  _____________________________________________________________________  External Attachment:    Type:   Image     Comment:   External Document

## 2010-06-01 NOTE — Assessment & Plan Note (Signed)
Summary: 3 MO FU PER JUNE REMINDER-SRS  Medications Added K-TABS 10 MEQ CR-TABS (POTASSIUM CHLORIDE) Take 1 tablet by mouth once a day as needed with Lasix LASIX 40 MG TABS (FUROSEMIDE) Take 1 tablet by mouth once a day use as needed only AMLODIPINE BESYLATE 10 MG TABS (AMLODIPINE BESYLATE) Take 1/2 tablet by mouth daily GAS-X EXTRA STRENGTH 125 MG CAPS (SIMETHICONE) as needed      Allergies Added: NKDA  Visit Type:  Follow-up Primary Provider:  Dr. [name]   History of Present Illness: 65 year old woman presents for followup. She is status post placement of a drug-eluting stent to the circumflex and obtuse marginal system in January by Dr. Clifton James, with findings of an occluded SVG to OM and occluded SVG to diagonal, with patent SVG to PDA and patent LIMA to LAD. LVEF was 60%.  She presents for a followup visit. She continues to deny any significant angina. She does report feeling weak, and has described some orthostatic lightheadedness over the last several weeks. She has had no palpitations or syncope.  She is using Wellbutrin SR, and states that she has been able to cut back her cigarette use to "3 a day." She is hopeful that she will be able to quit altogether in the near future.  She reports compliance with her medications, including diuretic therapy which she uses for lower extremity edema long term. She states that diuretics typically have not made much of a difference with her lower extremity edema, which seems to be most likely related to venous stasis. She does indicate that elevating her legs seems to help. She reports a stable appetite.   Preventive Screening-Counseling & Management  Alcohol-Tobacco     Smoking Status: current     Smoking Cessation Counseling: yes     Packs/Day: <1/3 PPD  Current Medications (verified): 1)  K-Tabs 10 Meq Cr-Tabs (Potassium Chloride) .... Take 1 Tablet By Mouth Once A Day 2)  Lasix 40 Mg Tabs (Furosemide) .... Take 1 Tablet By Mouth  Once A Day 3)  Aspirin 325 Mg Tabs (Aspirin) .... Take 1 Tablet By Mouth Once A Day 4)  Glyburide 5 Mg Tabs (Glyburide) .... Take 2 Tablet By Mouth Once A Day 5)  Metoprolol Tartrate 50 Mg Tabs (Metoprolol Tartrate) .... Take 1 Tablet By Mouth Two Times A Day 6)  Pravastatin Sodium 80 Mg Tabs (Pravastatin Sodium) .... Take 1 Tablet By Mouth Once A Day 7)  Alprazolam 2 Mg Tabs (Alprazolam) .... Take 1 Tablet By Mouth Three Times A Day 8)  Constulose 10 Gm/80ml Soln (Lactulose) .... Use As Directed 9)  Hydrocodone-Acetaminophen 7.5-750 Mg Tabs (Hydrocodone-Acetaminophen) .... Take 1 Tablet By Mouth Four Times A Day As Needed 10)  Amitriptyline Hcl 100 Mg Tabs (Amitriptyline Hcl) .... Take 2 Tablet By Mouth Once A Day At Bedtime 11)  Amlodipine Besylate 10 Mg Tabs (Amlodipine Besylate) .... Take One Tablet By Mouth Daily 12)  Prevacid 30 Mg Cpdr (Lansoprazole) .... Take 1 Tablet By Mouth Once A Day 13)  Isosorbide Mononitrate Cr 30 Mg Xr24h-Tab (Isosorbide Mononitrate) .... Take One Tablet By Mouth Daily 14)  Plavix 75 Mg Tabs (Clopidogrel Bisulfate) .... Take 1 Tablet By Mouth Once A Day 15)  Wellbutrin Sr 150 Mg Xr12h-Tab (Bupropion Hcl) .... Take 1 Tablet By Mouth Two Times A Day As Directed 16)  Nicotine 21-14-7 Mg/24hr Kit (Nicotine) .... Over-The Counter Patches As Directed  Allergies (verified): No Known Drug Allergies  Past History:  Past Medical History: Last updated:  06/22/2009 Mitral regurgitation, mild to moderate Diabetes mellitus Obesity Peripheral arterial disease History of ischemic colitis CAD - multivessel, LVEF 50-55%, DES circ 1/11, occluded SVG to OM and SVG to diagonal, LVEF 60% Hyperlipidemia Hypertension  Past Surgical History: Last updated: 05/06/2009 Neck surgery Carpal tunnel surgery Partial hysterectomy Back Surgery CABG - LIMA-LAD; SVG-RCA; SVG-OM; SVG-DX in 1995 [facility]) Carotid Endarterectomy  Social History: Last updated: 01/19/2009 Tobacco Use -  Yes.  Disabled  Widowed  Alcohol Use - no Regular Exercise - yes Drug Use - no  Social History: Packs/Day:  <1/3 PPD  Review of Systems       The patient complains of peripheral edema.  The patient denies anorexia, fever, weight gain, chest pain, syncope, prolonged cough, headaches, hemoptysis, melena, and hematochezia.         She states she had an episode of perioral numbness and tingling after standing up several weeks ago. She states that her tongue felt "thick" as she had difficulty speaking, although this has resolved by her report. She had no palpitations or syncope at that time. No focal motor deficits are described. Otherwise reviewed and negative.  Vital Signs:  Patient profile:   65 year old female Height:      64 inches Weight:      272 pounds Pulse rate:   66 / minute Pulse (ortho):   108 / minute BP sitting:   98 / 62  (left arm) BP standing:   93 / 59 Cuff size:   large  Vitals Entered By: Carlye Grippe (October 04, 2009 1:47 PM)  Serial Vital Signs/Assessments:  Time      Position  BP       Pulse  Resp  Temp     By 1:58 PM   Lying LA  110/62   52                    Lydia Anderson 1:58 PM   Sitting   105/67   56                    Lydia Anderson 1:58 PM   Standing  93/59    108                   Lydia Anderson 2:00 PM   Standing  114/71   57                    Carlye Grippe 2:06 PM   Standing  88/56    60                    Carlye Grippe    Physical Exam  Additional Exam:  Morbidly obese woman in no acute distress. HEENT: Conjunctiva and lids normal, oropharynx with moist mucosa. Neck: Supple, no carotid bruits, no elevated jugular venous pressure. Lungs: Diminished breath sounds, nonlabored. No wheezing. Cardiac: Regular rate and rhythm, soft systolic murmur. No S3. Abdomen: Obese, nontender, no bruits. Skin: Warm and dry. Extremities: 1+ chronic appearing edema below the knees, venous stasis. Musculoskeletal: No gross deformities. Neuropsychiatric:  Alert and oriented x3, affect appropriate. No dysarthria.   Cardiac Cath  Procedure date:  05/25/2009  Findings:       RESULTS:  Hemodynamics:  RA mean 5, RV 26/7, PA 22/7, pulmonary   capillary wedge pressure mean 17, LV 135/11, AO 146/99, cardiac   output/cardiac index 4.3/1.9.      Coronaries:  Left main was  normal, the LAD at proximal 95% stenosis   followed by proximal to mid 95% stenosis.  These lesions compromised to   moderate sized first diagonal which was patent.  The second diagonal was   moderate-sized and occluded at the ostium.  There was scant collateral   filling.  There was no competitive flow suggesting previous bypass graft   was no longer patent.      Circumflex:  The circumflex in the AV groove had 99% stenosis in a   proximal previously stented area after the first obtuse marginal.  The   first obtuse marginal was a large vessel.  There was a proximal stent   with 95% stenosis.  The remainder of the circumflex included a moderate-   sized posterolateral which was free of high-grade disease.  The right   coronary artery is a dominant vessel.  There was severe proximal and mid   diffuse disease.  The PDA was predominantly seen to fill via a vein   graft.  PDA was moderate-sized.  Posterolateral was small.  There were   no high-grade lesions in the PDA or posterolateral.      Grafts:  Saphenous vein graft to the PDA was patent with luminal   irregularities and a distal 50% stenosis.  Saphenous vein graft to OM1   was occluded, saphenous vein graft to diagonal was occluded (of note, I   only selectively cannulated one of these grafts and could not find the   aortic anastomosis for the other.  However, aortogram demonstrated no   patency of these vein grafts).  A LIMA to the LAD was widely patent.      Left ventriculogram:  Left ventriculogram was obtained in the RAO   projection.  There was some ectopy, but it appeared to be well-preserved   ejection fraction  with an EF of approximately 60%.  Aortic root was   obtained in order to further clarify the patency or lack thereof of the   vein grafts.  Impression & Recommendations:  Problem # 1:  ORTHOSTATIC HYPOTENSION (ICD-458.0)  Newly documented. Plan to decrease Norvasc from 10 mg to 5 mg daily, and discontinue both Lasix and potassium. She will followup next week in the clinic for repeat blood pressure check, orthostatics, and also with blood work including a Nutritional therapist and CBC. Given the recent heat, I reminded her to avoid dehydration when outside for prolonged periods of time. Followup clinical assessment next week.  Problem # 2:  CAD, NATIVE VESSEL (ICD-414.01)  Stable without active angina on present medical regimen following revascularization as noted above back in January.  Her updated medication list for this problem includes:    Aspirin 325 Mg Tabs (Aspirin) .Marland Kitchen... Take 1 tablet by mouth once a day    Metoprolol Tartrate 50 Mg Tabs (Metoprolol tartrate) .Marland Kitchen... Take 1 tablet by mouth two times a day    Amlodipine Besylate 10 Mg Tabs (Amlodipine besylate) .Marland Kitchen... Take 1/2 tablet by mouth daily    Isosorbide Mononitrate Cr 30 Mg Xr24h-tab (Isosorbide mononitrate) .Marland Kitchen... Take one tablet by mouth daily    Plavix 75 Mg Tabs (Clopidogrel bisulfate) .Marland Kitchen... Take 1 tablet by mouth once a day  Her updated medication list for this problem includes:    Aspirin 325 Mg Tabs (Aspirin) .Marland Kitchen... Take 1 tablet by mouth once a day    Metoprolol Tartrate 50 Mg Tabs (Metoprolol tartrate) .Marland Kitchen... Take 1 tablet by mouth two times a day    Amlodipine Besylate 10 Mg  Tabs (Amlodipine besylate) .Marland Kitchen... Take 1/2 tablet by mouth daily    Isosorbide Mononitrate Cr 30 Mg Xr24h-tab (Isosorbide mononitrate) .Marland Kitchen... Take one tablet by mouth daily    Plavix 75 Mg Tabs (Clopidogrel bisulfate) .Marland Kitchen... Take 1 tablet by mouth once a day  Problem # 3:  TOBACCO USER (ICD-305.1)  She continues on Wellbutrin SR, and has cut back her cigarettes to 3  per day by report. I encouraged her to continue on to complete cessation.  Other Orders: T-Basic Metabolic Panel [phone]) T-CBC No Diff ([id number])  Patient Instructions: 1)  Use Lasix (furosemide) and potassium as needed only. 2)  Decrease Norvasc (amlodipine) to 5mg  by mouth once daily. You may take 1/2 of your 10mg  tablet. 3)  Your physician recommends that you go to the [facility] for lab work: DO LAB WORK BEFORE YOUR APPT NEXT WEEK. 4)  Follow up with our PA/Dr. Diona Browner on Thursday, October 13, 2009 2pm.

## 2010-06-01 NOTE — Medication Information (Signed)
Summary: [facility] D/C MEDICATION ORDERS  [facility] D/C MEDICATION ORDERS   Imported By: Zachary George 05/04/2010 09:40:08  _____________________________________________________________________  External Attachment:    Type:   Image     Comment:   External Document

## 2010-06-01 NOTE — Letter (Signed)
Summary: [facility] D/C DR.Michelene Gardener  [facility] D/C DR.LINDA OBRIEN   Imported By: Zachary George 05/05/2009 16:11:30  _____________________________________________________________________  External Attachment:    Type:   Image     Comment:   External Document

## 2010-06-01 NOTE — Letter (Signed)
Summary: External Correspondence/ FAXED PRE-CATH ORDER  External Correspondence/ FAXED PRE-CATH ORDER   Imported By: Dorise Hiss 05/17/2009 12:16:05  _____________________________________________________________________  External Attachment:    Type:   Image     Comment:   External Document

## 2010-06-01 NOTE — Letter (Signed)
Summary: Cardiac Cath Instructions - [facility]  [facility] [address]   [place], [place] [address]   Phone: [phone]  Fax: [phone]     05/06/2009 MRN: [id number]  [facility] [address] [place], [place]  [address]  Dear Ms. Kutz,   You are scheduled for a Cardiac Catheterization on Friday, May 13, 2009 at 12:30.  Please arrive to the 1st floor of the [facility] and V[facility] Center at [facility] at 11:30am on the day of your procedure. Please do not arrive before 6:30 a.m. Call the Heart and Vascular Center at [phone] if you are unable to make your appointmnet. The Code to get into the parking garage under the building is 0090. Take the elevators to the 1st floor. You must have someone to drive you home. Someone must be with you for the first 24 hours after you arrive home. Please wear clothes that are easy to get on and off and wear slip-on shoes. Do not eat or drink after midnight except water with your medications that morning. Bring all your medications and current insurance cards with you.  _X__ DO NOT take these medications before your procedure: Glyburide,      Lasix  __X_ Make sure you take your aspirin.  __X_ You may take ALL of your medications with water that morning      except those listed above.  ___ DO NOT take ANY medications before your procedure.  ___ Pre-med instructions:  ________________________________________________________________________  The usual length of stay after your procedure is 2 to 3 hours. This can vary.  If you have any questions, please call the office at the number listed above.  Hoover Brunette, LPN          Directions to the [facility]  Please Note : Park in [place] under the building not the parking deck.  From [employer]: Turn onto [address] Left onto [address] (1st stoplight) Right at the brick entrance to the  hospital ([address]) Bear to the right and you will see a blue sign "[facility]" Parking garage is a sharp right'to get through the gate out in the code _______. Once you park, take the elevator to the first floor. Please do not arrive before 0630am. The building will be dark before that time.   From [address] Turn onto [address] Turn left into the brick entrance to the hospital ([address] circle [address]) [place] to the right and you will see a blue sign "[facility]" Parking garage is a sharp right, to get thru the gate put in the code ____. Once you park, take the elevator to the first floor. Please do not arrive before 0630am. The building will be dark before that time

## 2010-06-08 ENCOUNTER — Encounter: Payer: Self-pay | Admitting: Cardiology

## 2010-06-08 ENCOUNTER — Ambulatory Visit (INDEPENDENT_AMBULATORY_CARE_PROVIDER_SITE_OTHER): Payer: PRIVATE HEALTH INSURANCE | Admitting: Cardiology

## 2010-06-08 DIAGNOSIS — I10 Essential (primary) hypertension: Secondary | ICD-10-CM

## 2010-06-08 DIAGNOSIS — I2510 Atherosclerotic heart disease of native coronary artery without angina pectoris: Secondary | ICD-10-CM

## 2010-06-08 DIAGNOSIS — F17200 Nicotine dependence, unspecified, uncomplicated: Secondary | ICD-10-CM

## 2010-06-15 NOTE — Assessment & Plan Note (Signed)
Summary: [id number]/agh/tg      Allergies Added: NKDA  Visit Type:  Follow-up Primary Provider:  Dr. [name]   History of Present Illness: 65 year old woman presents for followup. She was recently seen in early January in followup of evaluation with Cardiolite at [facility], findings consistent with native vessel and graft disease. Medical therapy has been pursued so far, although we did discuss cardiac catheterization if her symptoms progressed.  She presents today stating that she feels "better." She has had one episode of reported angina requiring a single nitroglycerin. Otherwise she reports better energy, and states she's been trying to do a few chores around the house.  She also has been working on smoking cessation, now on nicotine patches with p.r.n. nicotine gum, not smoking over the last week. We discussed this today I spoke with her about challenges ahead, also congratulated her.  She remains comfortable with continuing medical therapy and observation at this point based on our discussions.  Current Medications (verified): 1)  K-Tabs 10 Meq Cr-Tabs (Potassium Chloride) .... Take 1 Tablet By Mouth Once A Day As Needed With Lasix 2)  Lasix 40 Mg Tabs (Furosemide) .... Take 1 Tablet By Mouth Once A Day Use As Needed Only 3)  Aspirin 81 Mg Tbec (Aspirin) .... Take One Tablet By Mouth Daily 4)  Glyburide 5 Mg Tabs (Glyburide) .... Take 2 Tablet By Mouth Twice A Day 5)  Metoprolol Tartrate 50 Mg Tabs (Metoprolol Tartrate) .... Take 1 Tablet By Mouth Two Times A Day 6)  Pravastatin Sodium 80 Mg Tabs (Pravastatin Sodium) .... Take 1 Tablet By Mouth Once A Day 7)  Alprazolam 2 Mg Tabs (Alprazolam) .... Take 1 Tablet By Mouth Three Times A Day As Needed 8)  Constulose 10 Gm/70ml Soln (Lactulose) .... Use As Directed 9)  Hydrocodone-Acetaminophen 7.5-750 Mg Tabs (Hydrocodone-Acetaminophen) .... Take One By Mouth Every 4 Hours As Needed 10)  Amitriptyline Hcl 100 Mg Tabs (Amitriptyline Hcl)  .... Take 2 Tablet By Mouth Once A Day At Bedtime 11)  Amlodipine Besylate 5 Mg Tabs (Amlodipine Besylate) .... Take 1 Tablet By Mouth Once A Day 12)  Isosorbide Mononitrate Cr 60 Mg Xr24h-Tab (Isosorbide Mononitrate) .... Take 1 Tablet By Mouth Once A Day 13)  Plavix 75 Mg Tabs (Clopidogrel Bisulfate) .... Take 1 Tablet By Mouth Once A Day 14)  Gas-X Extra Strength 125 Mg Caps (Simethicone) .... As Needed 15)  Miralax  Powd (Polyethylene Glycol 3350) .Marland Kitchen.. 17gm Mixed With 8oz Water or Juice By Mouth Daily 16)  Metformin Hcl 500 Mg Tabs (Metformin Hcl) .... Take 1 Tablet By Mouth Two Times A Day 17)  Nitrostat 0.4 Mg Subl (Nitroglycerin) .... Use As Needed Chest Pain  Allergies (verified): No Known Drug Allergies  Comments:  Nurse/Medical Assistant: patient brought med list from previous ov and stated all meds are the same  the drug store [place]  Past History:  Past Medical History: Last updated: 10/13/2009 Mitral regurgitation, mild to moderate Diabetes mellitus Obesity Peripheral arterial disease History of ischemic colitis CAD - multivessel, LVEF 50-55%, DES circ 1/11, occluded SVG to OM and SVG to diagonal, LVEF 60% Hyperlipidemia Hypertension Orthostatic hypotension  Past Surgical History: Last updated: 05/06/2009 Neck surgery Carpal tunnel surgery Partial hysterectomy Back Surgery CABG - LIMA-LAD; SVG-RCA; SVG-OM; SVG-DX in 1995 [facility]) Carotid Endarterectomy  Social History: Last updated: 01/19/2009 Tobacco Use - Yes.  Disabled  Widowed  Alcohol Use - no Regular Exercise - yes Drug Use - no  Review of Systems  The patient complains of dyspnea on exertion.  The patient denies anorexia, fever, weight loss, syncope, peripheral edema, melena, and hematochezia.         Otherwise reviewed and negative except as outlined.  Vital Signs:  Patient profile:   65 year old female Weight:      271 pounds BMI:     46.69 O2 Sat:      94 % on Room air Pulse  rate:   70 / minute BP sitting:   106 / 63  (left arm)  Vitals Entered By: Dreama Saa, CNA (June 08, 2010 1:25 PM)  O2 Flow:  Room air  Physical Exam  Additional Exam:  Morbidly obese chronically ill-appearing woman in no acute distress. HEENT: Conjunctiva and lids are grossly normal with ecchymosis on the right, oropharynx with poor dentition. Neck: Supple, no obvious elevation in JVP, no bruits. Lungs: Diminished breath sounds without wheezing or labored breathing. Cardiac: Regular rate and rhythm, no S3 or loud systolic murmur. Abdomen: Soft, nontender, bowel sounds present Skin: Warm and dry. Extremities: No pitting edema, distal pulses one plus. Musculoskeletal: No kyphosis. Neuropsychiatric: Alert and oriented x3, affect grossly appropriate.   Impression & Recommendations:  Problem # 1:  CAD, NATIVE VESSEL (ICD-414.01)  Continue medical therapy and observation at this point, patient reporting reasonable symptom control. Follow up will be scheduled in 3 months, sooner if necessary.  Her updated medication list for this problem includes:    Aspirin 81 Mg Tbec (Aspirin) .Marland Kitchen... Take one tablet by mouth daily    Metoprolol Tartrate 50 Mg Tabs (Metoprolol tartrate) .Marland Kitchen... Take 1 tablet by mouth two times a day    Amlodipine Besylate 5 Mg Tabs (Amlodipine besylate) .Marland Kitchen... Take 1 tablet by mouth once a day    Isosorbide Mononitrate Cr 60 Mg Xr24h-tab (Isosorbide mononitrate) .Marland Kitchen... Take 1 tablet by mouth once a day    Plavix 75 Mg Tabs (Clopidogrel bisulfate) .Marland Kitchen... Take 1 tablet by mouth once a day    Nitrostat 0.4 Mg Subl (Nitroglycerin) ..... Use as needed chest pain  Problem # 2:  TOBACCO USER (ICD-305.1)  She is working on smoking cessation, using nicotine replacement strategies.  Problem # 3:  HYPERTENSION, UNSPECIFIED (ICD-401.9)  Blood pressure well controlled today.  Her updated medication list for this problem includes:    Lasix 40 Mg Tabs (Furosemide) .Marland Kitchen... Take  1 tablet by mouth once a day use as needed only    Aspirin 81 Mg Tbec (Aspirin) .Marland Kitchen... Take one tablet by mouth daily    Metoprolol Tartrate 50 Mg Tabs (Metoprolol tartrate) .Marland Kitchen... Take 1 tablet by mouth two times a day    Amlodipine Besylate 5 Mg Tabs (Amlodipine besylate) .Marland Kitchen... Take 1 tablet by mouth once a day  Patient Instructions: 1)  Your physician recommends that you schedule a follow-up appointment in: 3 months 2)  Your physician recommends that you continue on your current medications as directed. Please refer to the Current Medication list given to you today.

## 2010-06-21 ENCOUNTER — Encounter: Payer: Self-pay | Admitting: Cardiology

## 2010-07-16 LAB — POCT I-STAT 3, VENOUS BLOOD GAS (G3P V)
Acid-base deficit: 1 mmol/L (ref 0.0–2.0)
Bicarbonate: 25.4 mEq/L — ABNORMAL HIGH (ref 20.0–24.0)
O2 Saturation: 60 %
TCO2: 27 mmol/L (ref 0–100)
pH, Ven: 7.347 — ABNORMAL HIGH (ref 7.250–7.300)

## 2010-07-16 LAB — BASIC METABOLIC PANEL
CO2: 26 mEq/L (ref 19–32)
CO2: 28 mEq/L (ref 19–32)
Calcium: 8.6 mg/dL (ref 8.4–10.5)
Chloride: 101 mEq/L (ref 96–112)
Chloride: 102 mEq/L (ref 96–112)
Creatinine, Ser: 0.77 mg/dL (ref 0.4–1.2)
GFR calc Af Amer: >60 mL/min (ref >60)
GFR calc Af Amer: >60 mL/min (ref >60)
Glucose, Bld: 181 mg/dL — ABNORMAL HIGH (ref 70–99)
Potassium: 4 mEq/L (ref 3.5–5.1)
Sodium: 136 mEq/L (ref 135–145)
Sodium: 137 mEq/L (ref 135–145)

## 2010-07-16 LAB — CBC
HCT: 40.3 % (ref 36.0–46.0)
Hemoglobin: 12.8 g/dL (ref 12.0–15.0)
Hemoglobin: 13.9 g/dL (ref 12.0–15.0)
MCHC: 34.4 g/dL (ref 30.0–36.0)
MCHC: 34.7 g/dL (ref 30.0–36.0)
RBC: 3.97 MIL/uL (ref 3.87–5.11)
RBC: 4.36 MIL/uL (ref 3.87–5.11)
RDW: 13.1 % (ref 11.5–15.5)
WBC: 6.6 10*3/uL (ref 4.0–10.5)

## 2010-07-16 LAB — PROTIME-INR: INR: 1 (ref 0.00–1.49)

## 2010-07-16 LAB — POCT I-STAT 3, ART BLOOD GAS (G3+)
Bicarbonate: 25.2 mEq/L — ABNORMAL HIGH (ref 20.0–24.0)
TCO2: 26 mmol/L (ref 0–100)
pCO2 arterial: 41.3 mmHg (ref 35.0–45.0)
pH, Arterial: 7.395 (ref 7.350–7.400)
pO2, Arterial: 71 mmHg — ABNORMAL LOW (ref 80.0–100.0)

## 2010-07-16 LAB — GLUCOSE, CAPILLARY
Glucose-Capillary: 162 mg/dL — ABNORMAL HIGH (ref 70–99)
Glucose-Capillary: 211 mg/dL — ABNORMAL HIGH (ref 70–99)

## 2010-09-12 NOTE — Assessment & Plan Note (Signed)
[facility]                          [facility] NOTE   NAME:Mikolajczak, ALEKSIA FREIMAN                    MRN:          [id number]  DATE:01/07/2008                            DOB:          1946/01/13    PRIMARY CARE PHYSICIAN:  Francoise Schaumann. Halm, DO, FAAP   REASON FOR VISIT:  Cardiology followup.   HISTORY OF PRESENT ILLNESS:  Ms. Gotcher came to the office today to  establish cardiac followup.  She was seen in the hospital by Dr. Doyne Keel  during a presentation with cough, chest pain, intermittent palpitations,  and findings of relative hypoglycemia.  She was admitted for observation  and ruled out for myocardial infarction.  No other specific cardiac  workup was undertaken based on the discharge summary.  I see that she  had normal cardiac markers and also her BNP was overall reassuring at  193.  Chest x-ray showed stable mild cardiomegaly with no overt heart  failure.  She was apparently scheduled to come and see [place] for routine  cardiac followup, as she has not had any over the last few years.  In  reviewing the records, I note that I actually saw her here in [place] back  in November 2007.  She has had followup in our regional office since  then with Dr. Dietrich Pates specifically and last had a stress test in 2007  demonstrating no active ischemia based on records.  Her cardiac history  is outlined in my previous note from November 2007 and includes coronary  artery bypass grafting in 1995 with a LIMA to the left anterior  descending saphenous vein graft to the right coronary artery, saphenous  vein graft to the obtuse marginal, and saphenous vein graft to diagonal.  This was done at [facility].  Subsequent cardiac  catheterization in 2004 demonstrated severe graft disease with the  exception of the LIMA to the left anterior descending and she has been  managed medically since that time.  She has chronic recurrent chest pain  which  continues and also occasional palpitations.  She was concerned  because she was told that she had congestive heart failure apparently  by someone during her recent hospital stay, although I see no clear  documentation that this was actually the case.  I reviewed her  medications which are outlined below and she states that she has  followup in the near future with Dr. Milford Cage.  Recent electrocardiogram  from July demonstrated sinus rhythm with anteroseptal Q-waves and  nonspecific ST-T wave changes.  She reports compliance with her cardiac  medications.   ALLERGIES:  No known drug allergies.   MEDICATIONS:  1. Fluvoxamine 100 mg p.o. b.i.d.  2. Potassium chloride 10 mEq p.o. daily.  3. Lasix 40 mg p.o. daily.  4. Metoprolol 25 mg p.o. b.i.d.  5. Pravastatin 40 mg p.o. daily.  6. Aspirin 325 mg p.o. daily.  7. Glyburide 5 mg p.o. q.a.m.  8. Metoclopramide 40 mg p.o. q.a.c. and nightly.  9. Norvasc 5 mg p.o. daily.  10.Zyprexa 5 mg p.o. daily.  11.Lactulose p.r.n.  12.Hydroxyzine p.r.n.  13.She also has sublingual nitroglycerin 0.4 mg p.r.n.   REVIEW OF SYSTEMS:  As per in the history of present illness.  She  complains of problems with recurrent low blood sugar associated with  sweats.  She has chronic recurrent exertional chest pain.  No syncope;  chronic lower extremity edema, right worse than the left; and no frank  orthopnea, otherwise negative.   PHYSICAL EXAMINATION:  VITAL SIGNS:  Blood pressure 166/75, heart rate  is 52, weight is 241 pounds, and blood sugar is 72.  GENERAL:  This is an obese woman in no acute distress.  HEENT:  Conjunctivae are normal.  Oropharynx is clear.  NECK:  Supple.  No elevated jugular venous pressure.  No audible bruits.  No thyromegaly.  LUNGS:  Exhibit diminished breath sounds.  Nonlabored breathing.  CARDIAC:  Regular rate and rhythm.  Soft systolic murmur at the base.  No obvious S3 gallop.  No pericardial rub.  ABDOMEN:  Obese and  nontender.  No active bowel sounds.  EXTREMITIES:  Exhibit chronic-appearing edema, right worse than left.  Evidence of previous vein harvesting bilaterally.  Distal pulses are  diminished at 1+.  SKIN:  Warm and dry.  MUSCULOSKELETAL:  No kyphosis noted.  NEUROPSYCHIATRIC:  The patient is alert and oriented x3.  Affect is  somewhat flat.   IMPRESSION AND RECOMMENDATIONS:  1. Multivessel cardiovascular disease with graft disease as outlined      in my previous note from November 2007.  The patient does have a      left internal mammary artery to the left anterior descending      (placed in 1995 at [facility]) and is being      managed medically for recurrent anginal chest pain.  She ruled out      during a recent hospital observation with other noncardiac      symptoms.  Last stress testing and echocardiogram were over 2 years      ago.  I reviewed her medications and I suggested that we followup      with a 2-D echocardiogram for reassessment of left ventricular      function.  I can certainly continue to titrate medical therapy for      better angina control.  She had no definite ischemia by last stress      test.  She reports that her symptom frequency has not changed.  We      will, therefore, plan to see her back over the next 3 months unless      she has escalating symptomatology.  2. Diabetes mellitus and hypertension, follow up with Dr. Milford Cage.  She      also has been on statin therapy for hyperlipidemia and reports that      this is due to be checked again in the near future.  Would aim for      aggressive LDL control around 70.  3. History of severe peripheral arterial disease with prior ischemic      colitis.    Jonelle Sidle, MD  Electronically Signed   SGM/MedQ  DD: 01/07/2008  DT: 01/08/2008  Job #: [id number]   cc:   Francoise Schaumann. Halm, DO, FAAP

## 2010-09-12 NOTE — Assessment & Plan Note (Signed)
[facility]                          [facility] NOTE   NAME:[name], [name]                    MRN:          [id number]  DATE:07/01/2008                            DOB:          Jun 19, 1945    PRIMARY CARE PHYSICIAN:  Misty Schaumann. Halm, DO, FAAP   REASON FOR VISIT:  Routine followup.   HISTORY OF PRESENT ILLNESS:  Ms. [name] was seen in the office back in  September 2009.  Her history is detailed in the previous note.  She  denies having any problems with progressive angina, although admits that  she has not been very active during the winter months.  She has  undergone some recent medication adjustments by Dr. Milford Cage with increase  in beta-blocker therapy aimed at better control of hypertension.  She  does note that her blood pressure has been less well controlled over the  last year.  I referred her for a follow up echocardiogram after our last  visit demonstrating normal left ventricular systolic function with an  ejection fraction of 55-60%, no regional wall motion abnormalities, mild-  to-moderate mitral regurgitation, mild tricuspid regurgitation, and  mildly increased right ventricular systolic pressure of 36 mmHg.  In  reviewing her medicines, I note that she was previously on Norvasc,  although this is somehow dropped off her medicine list.   ALLERGIES:  No known drug allergies.   MEDICATIONS:  1. Potassium chloride 10 mg p.o. daily.  2. Lasix 40 mg p.o. daily.  3. Aspirin 325 mg p.o. daily.  4. Glyburide 5 mg p.o. q.a.m.  5. Lopressor 50 mg p.o. b.i.d.  6. Pravastatin 80 mg p.o. at bedtime.  7. Symbyax 3/25 mg 2 tablets p.o. daily.  8. Alprazolam 2 mg p.o. at bedtime.  9. Lactulose p.r.n.  10.Hydrocodone p.r.n.   REVIEW OF SYSTEMS:  As outlined above.  No palpitations, dizziness, or  syncope.  No orthopnea.  Otherwise negative.   PHYSICAL EXAMINATION:  VITAL SIGNS:  Blood pressure today; 148/78, heart  rate is 51, and  weight is 265 pounds.  GENERAL:  This is an obese woman, in no acute distress.  HEENT:  Conjunctiva is normal.  Pharynx clear.  NECK:  Supple.  No elevated jugular venous pressure noted.  No  thyromegaly noted.  LUNGS:  Diminished breath sounds.  Nonlabored breathing.  CARDIAC:  Regular rate and rhythm.  Soft basal systolic murmur.  No S3  gallop.  ABDOMEN:  Soft, nontender, and normoactive bowel sounds.  EXTREMITIES:  Exhibit chronic-appearing edema.  Previous vein  harvesting.  Distal pulses are 1+.  SKIN:  Warm and dry.  MUSCULOSKELETAL:  No kyphosis noted.  NEUROPSYCHIATRIC:  The patient is alert and oriented x3.  Affect is  appropriate.   IMPRESSION AND RECOMMENDATIONS:  1. Multivessel cardiovascular disease with subsequently documented      graft disease, but a patent LIMA to the left anterior descending.      Follow up left ventricular systolic function is normal.  She is not      reporting any progressive angina.  We will plan to continue medical  therapy and I have added back Norvasc 5 mg daily (generic) to her      regimen as both potential antianginal and also for better blood      pressure control.  We will plan to have her follow up over the next      6 months assuming she continues to be stable symptomatically.  2. Hyperlipidemia, on pravastatin.  We would aim for goal LDL control      around 70.  3. Hypertension, continuing to work on medication adjustments for more      optimal control.     Jonelle Sidle, MD  Electronically Signed    SGM/MedQ  DD: 07/01/2008  DT: 07/02/2008  Job #: [id number]   cc:   Misty Schaumann. Halm, DO, FAAP

## 2010-09-15 NOTE — Consult Note (Signed)
[name], [name]             ACCOUNT NO.:  [id number]   MEDICAL RECORD NO.:  [id number]          PATIENT TYPE:  INP   LOCATION:  A314                          FACILITY:  [facility]   PHYSICIAN:  R. Roetta Sessions, M.D. DATE OF BIRTH:  October 11, 1945   DATE OF CONSULTATION:  09/11/2005  DATE OF DISCHARGE:                                   CONSULTATION   REQUESTING PHYSICIAN:  [facility]   REASON FOR CONSULTATION:  Abnormal colon on CT.   HISTORY OF PRESENT ILLNESS:  Ms. Barner is a 65 year old lady who presented  to the emergency department yesterday with acute onset abdominal pain,  nausea, vomiting of approximately 12 hours duration.  She said her abdominal  pain began around Sunday evening.  It initially was all over, but seemed to  migrate to the left abdomen.  She felt extremely nauseated and made herself  vomit the first time.  However, afterwards had several episodes of vomiting.  No hematemesis.  She has chronic constipation and had been drinking lots of  juices on Sunday to try to have a bowel movement.  She finally had a bowel  movement yesterday.  She generally has a BM every three to four days.  No  melena or rectal bleeding.  She has had some problems with heartburn lately  taking OTC antacids.  No reported weight loss.  She was febrile in the ED  with a temperature of 101.5.  She has had intermittent dizziness, in the  last few weeks has had recurrence of this.   In the emergency department she had a CT which revealed a thick walled left  colon and upper sigmoid colon suggestive of colitis, question ischemic  colitis versus infectious.  Her white count is 9200.  Hemoglobin dropped  from 12.9 to 10 with hydration, platelet count 129,000.  BUN was 33 down to  32.  Creatinine was 1.4 now up to 1.8.  Urinalysis was negative.  Urine  output since admission has only been 500 mL.  IV fluids have been decreased  due to wheezing.  She has had significant hypotension,  especially post  Dilaudid.  This morning around 4:20 a.m. her blood pressure was 68/45,  currently is 104/58.   MEDICATIONS PRIOR TO ADMISSION:  1.  Lipitor 10 mg daily.  2.  Plavix 75 mg daily.  3.  Lisinopril 10 mg daily.  4.  Metoprolol 25 mg daily.  5.  Actos 30 mg daily.  6.  Vicodin 7.5 mg p.r.n.  7.  Lasix 40 mg p.r.n.  8.  Alprazolam 1 mg p.r.n.  9.  Elavil 100 mg q.h.s.  10. Fluvoxamine 100 mg daily.  11. OTC antacids.  12. Aspirin 325 mg daily.   ALLERGIES:  No known drug allergies.   PAST MEDICAL HISTORY:  1.  Coronary artery disease status post coronary artery bypass graft and      subsequent stenting.  2.  Type 2 diabetes mellitus.  3.  Hypertension.  4.  Hypercholesterolemia.  5.  Dizziness.  6.  Orthostatic hypotension.  7.  Anxiety.  8.  Depression.  9.  Hypothyroidism.  10. History of CVA.  11. Status post left carotid endarterectomy.  12. Bilateral carpal tunnel release.  13. Cervical laminectomy.  14. Lumbar spine surgery.  15. Hysterectomy.  16. She has had a history of ventricular ectopy and PVCs seen on Holter      monitor.  17. In the 1980s she had a flexible sigmoidoscopy and had removal of some      polyps.  No complete colonoscopy or EGD done in the past.   FAMILY HISTORY:  Mother died age 79 of MI.  Also had a history of breast  cancer.  Father died age 42 of MI.  No family history of colorectal cancer.   SOCIAL HISTORY:  She is married.  She has a 40-50-pack-year history of  tobacco use currently smoking one pack per day.  No alcohol use.  Four  children.   REVIEW OF SYSTEMS:  See HPI for GI.  CONSTITUTIONAL/CARDIOPULMONARY:  Denies  any chest pain.  She has had some wheezing overnight.   PHYSICAL EXAMINATION:  VITAL SIGNS:  Tmax 100.7, Tcurrent 98.2, pulse 58,  respirations 20, blood pressure 104/58, weight 235.3, height 64 inches.  GENERAL:  Pleasant, obese Caucasian female in no acute distress.  SKIN:  Warm and dry.  No jaundice.   HEENT:  Sclerae non-icteric.  Oropharyngeal mucosa moist and pink.  No  lesions, erythema, or exudate.  CHEST:  Decreased breath sounds throughout.  CARDIAC:  Regular rate and rhythm.  No murmurs, rubs, or gallops.  ABDOMEN:  Positive bowel sounds.  Obese, but symmetrical, soft.  She has  subjective guarding on the left abdomen to even light palpation.  Abdomen is  very soft.  No rebound tenderness.  Otherwise, more detailed examination not  permitted by patient.  EXTREMITIES:  No edema.   LABORATORIES:  As mentioned in HPI.  In addition, white count 9200.  Sodium  133, potassium 5, glucose 94.  Cardiac enzymes x1 negative.   IMPRESSION:  Patient is a 65 year old Caucasian female with acute onset  abdominal pain, nausea, vomiting.  CT revealed thick-walled left and upper  sigmoid colon.  High on the differential would include ischemic colitis  given history of peripheral vascular disease, coronary artery disease, and  ongoing tobacco abuse.  Infectious colitis also possibility as well,  however.  She has had no evidence of overt gastrointestinal bleeding or  diarrhea.  She has had a drop in her hemoglobin with hydration.  She is  having significant problems with hypotension, especially after receiving  Dilaudid.  Urine output is only 500 mL since admission and her creatinine is  up to 1.8 from 1.4.   RECOMMENDATIONS:  1.  Review CT.  2.  She will need a colonoscopy at some point.  3.  Continue IV antibiotics for now.  4.  Continue clear liquids.  5.  Management of hypotension, renal insufficiency per hospitalists.      Tana Coast, P.AJonathon Bellows, M.D.  Electronically Signed    LL/MEDQ  D:  09/11/2005  T:  09/11/2005  Job:  [id number]

## 2010-09-15 NOTE — Procedures (Signed)
[name], [name]             ACCOUNT NO.:  [id number]   MEDICAL RECORD NO.:  [id number]          PATIENT TYPE:  INP   LOCATION:  IC09                          FACILITY:  [facility]   PHYSICIAN:  Vida Roller, M.D.   DATE OF BIRTH:  July 27, 1945   DATE OF PROCEDURE:  09/11/2005  DATE OF DISCHARGE:                                  ECHOCARDIOGRAM   PRIMARY CARE PHYSICIAN:  Renato Battles, M.D.   DATE OF PROCEDURE:  Sep 11, 2005.   IDENTIFICATION:  Tape #[id number], tape count 3896 through 3259.  This is a  woman for LV systolic function who appears to be septic in the ICU.   DESCRIPTION OF PROCEDURE:  Technical quality of the study is adequate.  M  mode tracings:  The aorta is 30 mm.  The aorta is 47 mm.  Septum is 12 mm.  Posterior wall is 12 mm.   Left ventricular diastolic dimension 47 mm.  Left ventricular systolic  dimension 30 mm.   TWO-DIMENSIONAL AND DOPPLER IMAGING:   The left ventricle is normal size.  There is preserved left ventricular  systolic function.  Estimated ejection fraction 65 to 70%.  Normal wall  motion abnormalities are seen.  There is mild concentric left ventricular  hypertrophy.   The right ventricle is dilated with mildly depressed RV systolic function.   Both atria are dilated.   The aortic valve is sclerotic with no stenosis or regurgitation.   Mitral valve has mild regurgitation.   Tricuspid valve has mild regurgitation.   There is no pericardial effusion.      Vida Roller, M.D.  Electronically Signed     JH/MEDQ  D:  09/11/2005  T:  09/12/2005  Job:  [id number]   cc:   Renato Battles, M.D.

## 2010-09-15 NOTE — Discharge Summary (Signed)
NAME:  [name], [name]                       ACCOUNT NO.:  [id number]   MEDICAL RECORD NO.:  [id number]                   PATIENT TYPE:  OIB   LOCATION:  2020                                 FACILITY:  [facility]   PHYSICIAN:  Olga Millers, M.D. [facility]            DATE OF BIRTH:  December 16, 1945   DATE OF ADMISSION:  07/15/2002  DATE OF DISCHARGE:  07/17/2002                           DISCHARGE SUMMARY - REFERRING   SUMMARY OF HISTORY:  The patient is a 65 year old female who was seen in the  office by Dr. Dorethea Clan on July 14, 2002.  She was complaining of progressive  shortness of breath over the preceding 10 days and Dr. Dorethea Clan felt that she  had pulmonary edema on exam.  She was initially seen by [place] in March for  dizziness and hypotension that was initially thought to be associated with  her Lasix dose.  She does have a history of normal LV function and appeared  to be orthostatic at that time so her Lasix was discontinued.  She has a  history of bypass surgery in 1995 at [facility]  Dr.  Dorethea Clan had performed an echocardiogram and Holter monitor; however, these  reports are unavailable at the time of this dictation.  When he saw her in  the office on March 16 he felt that she should be admitted for diuresis;  however, the patient refused.  He also felt that she should undergo a  cardiac catheterization for further evaluation.  She also has a history of  obesity, tobacco use, COPD, diabetes.   LABORATORY DATA:  Preadmission sodium 142, potassium 4.2, BUN 9, creatinine  0.8.  H&H 14.0 and 41.0, normal indices, platelets 250, wbc's 5.9.  Subsequent labs were unremarkable.  EKG showed normal sinus rhythm.   HOSPITAL COURSE:  The patient underwent an outpatient cardiac  catheterization.  This revealed three-vessel native coronary artery disease.  She had an 80% proximal LAD, 90% mid LAD.  The stent involving the  circumflex and a branch had a 30% in-stent restenosis; the  stent involving  the circumflex branch had a 90% focal restenosis.  The RCA was diffusely  diseased with an 80% mid lesion.  The saphenous vein graft to the OM was  occluded.  The saphenous vein graft to the RCA was occluded.  The LIMA to  the LAD was patent; the saphenous vein graft was patent.  Dr. Samule Ohm noted  that she had markedly increased LVEDP and PCWP, and preserved systolic  function.  Dr. Samule Ohm felt there were multiple potential regions of ischemia  with no clear symptoms of ischemia.  He would favor medical therapy for CHF  to be followed by outpatient Cardiolite to guide anti-ischemic treatment.  Post sheath removal and bedrest she was ambulating without difficulty.  Dr.  Jens Som saw her on March 18 and noted a right femoral bruit.  Thus, an  ultrasound was obtained of the groin.  This did not show any evidence of  pseudoaneurysm.  He noted to consider changing atenolol to 25 mg at  discharge secondary to decreased blood pressure and dizziness.  On March 19  Dr. Jens Som then reviewed, felt at this time that she could be discharged  home.  However, he felt that she should not be taking atenolol secondary to  her hypotension.  He considered that if her blood pressures improved this  may be resumed as an outpatient pending blood pressure review.  He also felt  that she should probably have a BMP in approximately one week and an  outpatient Cardiolite, and to follow up with Dr. Danne Harbor in regards to  dizziness for possible vertigo.   DISCHARGE DIAGNOSES:  1. Congestive heart failure.  2. Progressive coronary artery disease as previously described.  3. History as previously.   DISPOSITION:  The patient is discharged home.   MEDICATIONS:  1. Lasix 20 mg daily.  2. K-Dur 10 mEq daily.  3. Coated aspirin 325 daily.  4. Elavil 200 q.h.s.  5. Lipitor 10 q.h.s.  6. Glucovance 2.5/500 b.i.d.  7. Actos 30 mg daily.  8. Nexium 40 mg daily.  9. Nitroglycerin as needed.  10.       She is instructed not to take her atenolol.   ACTIVITY:  She is instructed to gradually resume her activity.   WOUND CARE:  If she had any problems with her catheterization site, to call  us.   DIET:  She was asked to maintain low salt, fat, cholesterol ADA diet.   SPECIAL INSTRUCTIONS:  She was advised no smoking or tobacco products.   FOLLOW-UP:  1. She will have a rest/stress Cardiolite on Monday at 9:30 a.m.  She was     instructed to register at 7 a.m., to have nothing to eat or drink after     midnight on Sunday; however, she could take her medications with a small     sip of water.  2. She was asked to see Dr. Hardin and his P.A. on Friday, July 24, 2002 at     12 :30 p.m.  At that time a BMP should probably be drawn to follow up on     her potassium and BUN/creatinine with her diuretics.  Also, at the office     appointment discussion of the stress Cardiolite that was performed Monday     and review of cardiac risk factor modifications should ensue.  3. She was also asked to arrange a follow-up appointment with Dr. Danne Harbor in     regards to her dizziness and possible vertigo.     Joellyn Rued, P.A. [facility]                    Olga Millers, M.D. [facility]    EW/MEDQ  D:  07/17/2002  T:  07/18/2002  Job:  [id number]   cc:   Vida Roller, M.D.  Fax: [phone]   [facility]osal[facility]ia  [address] [place]  [place]  [place] [address]  Fax: [phone]

## 2010-09-15 NOTE — Assessment & Plan Note (Signed)
[facility]                              CARDIOLOGY OFFICE NOTE   NAME:[name], [name]                    MRN:          [id number]  DATE:03/14/2006                            DOB:          [birth year]    PRIMARY CARE PHYSICIAN:  Dr. Vivia [name].   REASON FOR VISIT:  Routine cardiology followup.   HISTORY OF PRESENT ILLNESS:  This is my first meeting in Misty office with Ms.  [name].  She was scheduled as a new [name] visit today, however, has  actually been followed long-term in our [place] office, previously by Dr.  Dorethea [name].  She was actually under Misty impression that she was going to be  reestablished at that particular office.  In any event, I reviewed her  history, which is fairly extensive, including multivessel coronary artery  disease status post coronary artery bypass grafting in 1995 with a LIMA to  Misty left anterior descending, saphenous vein graft to Misty right coronary  artery, saphenous vein graft to Misty obtuse marginal, and saphenous vein  graft to Misty diagonal.  Coronary angiography in 2004 revealed severe graft  disease with Misty exception of Misty LIMA to Misty left anterior descending, and  she has been managed medically since that time.  She also has severe  peripheral arterial disease and has been plagued by ischemic colitis with  abdominal pain.  She was seen in [facility] hospital back in May of this year and  was evaluated for Misty possibility of vascular surgery.  She had a CT  angiogram of Misty abdomen and pelvis showing significant stenosis of Misty  celiac artery with occlusion of Misty inferior mesenteric artery, bilateral  renal artery stenosis, bilateral iliac artery stenosis, and ultimately  findings of ischemic colitis by colonoscopy.  My understanding is that  surgery was contemplated, although [name] deferred given Misty increased  risk.  She states that over Misty last few months, she has continued to have a  poor appetite and  has occasional abdominal pain, although not nearly as  significant as back in May.   She, over Misty last week, has had some recurrent angina as well and has had  to use some sublingual nitroglycerin.  Her electrocardiogram today shows  sinus bradycardia at 49 beats per minute with nonspecific ST-T wave changes  and anteroseptal Q waves that are old.  No marked changes noted in  comparison to Misty previous tracing from 2005 located in Misty chart.  Of note,  she has fortunately had preserved left ventricular function based on  followup cardiac testing, and most recently an echocardiogram in May of this  year demonstrated an ejection fraction of 65% to 70% with mild mitral  regurgitation, mild tricuspid regurgitation, and no significant aortic  stenosis.  She did have mildly decreased right ventricular function and has  underlying chronic obstructive pulmonary disease.  She, unfortunately,  continues to smoke and we discussed this today.  My understanding is that  she was also treated for obstructive sleep apnea and was placed originally  on continuous home oxygen, although at this point she has not  been using  this regularly.  Today, I discussed with her her significantly increased  risk of adverse cardiovascular events given her comorbidities and tried to  impress upon her Misty importance of followup, smoking cessation, and medical  therapy.   ALLERGIES:  No known drug allergies.   PRESENT MEDICATIONS:  1. Metoprolol 25 mg p.o. daily.  2. Aspirin 325 mg p.o. daily.  3. Lipitor 10 mg p.o. daily.  4. Plavix 75 mg p.o. daily.  5. Lasix 40 mg p.o. daily.  6. Lisinopril 10 mg p.o. daily.  7. Citalopram 40 mg p.o. daily.  8. Actos 30 mg p.o. daily.  9. Alprazolam 2 mg p.o. t.i.d.  10.Zolpidem 10 mg p.o. q.i.d.  11.Fluvoxamine 100 mg 2 tablets p.o. nightly.  12.She also uses hydrocodone p.r.n.   REVIEW OF SYSTEMS:  As described in Misty history of present illness.  She has  had no obvious  melena or hematochezia.  She denies palpitations or syncope.  She has had no orthopnea.  She does complain of lower extremity swelling if  she forgets her Lasix, and also some abdominal bloating.   EXAMINATION:  Blood pressure today 122/80, heart rate 50 and regular, weight  222 pounds, down from 234 in April at Dr. Marchelle [name] last visit.  [name] is chronically ill-appearing in no acute distress.  HEENT:  Conjunctivae and lids normal.  Oropharynx is clear.  NECK:  Supple without elevated jugular venous pressure or loud bruits.  No  thyromegaly was noted.  LUNGS:  Diminished breath sounds.  No active wheezing noted.  CARDIAC:  Regular rate and rhythm without loud murmur or S3 gallop.  No  pericardial rub.  ABDOMEN:  Soft.  Bowel sounds present.  There is no focal tenderness to  palpation or guarding.  EXTREMITIES:  Trace edema.  Skin warm and dry.  No cyanosis, clubbing, or  edema.  Distal pulses are diminished.   IMPRESSION/RECOMMENDATIONS:  1. Multivessel coronary artery disease with preserved ejection fraction      status post previous coronary artery bypass grafting in 1995 as      outlined above.  She has significant graft disease with Misty exception      of Misty left internal mammary artery to Misty left anterior descending,      and is being managed medically.  Based on review of her records, she      has been felt to be a poor surgical candidate due to her comorbid      illness.  She is having some angina and I discussed with her adding      Imdur 30 mg p.o. nightly to her present regimen, and arranging a      symptom followup through our [place] office.  She would prefer to      follow up at that office since she lives in [place].  She was seen      by Dr. Dorethea [name] long-term in Misty past.  2. Continued tobacco use.  I discussed with her Misty critical importance of      smoking cessation.  3. Significant peripheral arterial disease with history of ischemic     colitis.  4.  Presumed hyperlipidemia, presently on statin therapy.  Her goal LDL      should be in Misty 70      range.  5. Type 2 diabetes mellitus, followed by Dr. Milford Cage.     Jonelle Sidle, MD  Electronically Signed    SGM/MedQ  DD: 03/14/2006  DT:  03/14/2006  Job #: [id number]   cc:   Francoise Schaumann. Halm, DO, FAAP

## 2010-09-15 NOTE — H&P (Signed)
NAME:  [name], [name]                       ACCOUNT NO.:  [id number]   MEDICAL RECORD NO.:  [id number]                   PATIENT TYPE:  INP   LOCATION:  A315                                 FACILITY:  [facility]   PHYSICIAN:  Jeoffrey Massed, M.D.             DATE OF BIRTH:  [birth year]   DATE OF ADMISSION:  09/16/2003  DATE OF DISCHARGE:                                HISTORY & PHYSICAL   PRIMARY CARE PHYSICIAN:  Francoise Schaumann. Halm, D.O.   CHIEF COMPLAINT:  Altered mental status.   HISTORY OF PRESENT ILLNESS:  History as per emergency department physician  and husband over the phone.  [name] is a 65 year old, white female with a  history of coronary artery disease, hypertension, hyperlipidemia and non-  insulin dependent diabetes who was brought to the emergency room about  midnight last night for altered mental status.  She has apparently had a  waxing and waning respiratory illness over the last couple of weeks and I  saw her in my clinic two days ago and diagnosed her with pneumonia and  started her on Ketek and Mucinex DM.  Apparently, after going home she  continued to eat poorly and last night had a temperature of 104.  Her  daughter gave her one of her sleeping pills and apparently [name]  has been taking Xanax and Vicodin daily for a long time.  Her daughter noted  that she was out of it after taking her sleeping pill and she was worried  that she was going to die and then called 911.   REVIEW OF SYMPTOMS:  RESPIRATORY:  Productive cough and shortness of breath  with exertion.  GASTROINTESTINAL:  No nausea, vomiting or diarrhea.  MUSCULOSKELETAL:  Complained of positive back pain.  CARDIAC:  Positive for  chest pain with coughing, otherwise no chest pain.   PAST MEDICAL HISTORY:  1. Coronary artery disease, status post CABG.  Has had catheterization x3,     last in March 2004.  Cardiologist is Dr. Dietrich Pates.  2. Hypertension.  3. Hyperlipidemia.  4. Diabetes, type 2,  non-insulin requiring.  5. History of dizziness.  6. Orthostatic hypotension.  7. Anxiety.  8. Depression.  9. History of carotid endarterectomy.  10.      History of carpal tunnel release.  11.      History of cervical laminectomy.  12.      History of lumbar spine surgery.  13.      History of hypothyroidism, but no longer on Synthroid.   MEDICATIONS:  1. Lasix 40 mg daily.  2. Lipitor 10 mg daily.  3. Plavix 75 mg daily.  4. Aspirin 81 mg daily.  5. Kay Ciel 20 mEq p.o. b.i.d.  6. Zoloft 100 mg q.d.  7. Amitriptyline 200 mg q.h.s.  8. Xanax 2 mg t.i.d.  9. Ambien 10 mg p.o. q.h.s.  10.      Actos 15 mg daily.  11.      Vicodin 7.5/500 p.r.n.  12.      Atenolol 50 mg b.i.d. (the patient says she no longer takes this).   ALLERGIES:  No known drug allergies.   SOCIAL HISTORY:  The patient is married and has four children who live in  the area.  She has smoked 1 pack per day of cigarettes all of her life.  She  does not drink alcohol or take street drugs.   FAMILY HISTORY:  Mother died at age 72 of an MI.  She also had breast  cancer.  Father died at age 69 of an MI.   PHYSICAL EXAMINATION:  VITAL SIGNS:  Temperature 98 orally on initial check  in the ER and then 102.6 rectal a few minutes later.  Blood pressure 127/62,  pulse 81, respirations 20.  O2 saturations 96% on room air.  GENERAL:  The patient was sleeping, but arousable to sternal rub.  She would  follow commands intermittently and was in no distress.  She mumbled  occasionally and answered questions infrequently.  HEENT:  Pupils equal round and reactive to light.  Extraocular movements  intact.  Tongue is midline.  Tympanic membranes with good light reflex on  landmarks bilaterally.  Head is atraumatic.  NECK:  Supple with no lymphadenopathy or thyromegaly.  Redundant soft tissue  makes JVD difficult to assess.  Carotid pulses trace bilaterally.  LUNGS:  Clear to auscultation bilaterally with the exception of  questionable  right lower to mid lung base crackles on inspiration and expiration.  Breathing is not labored at a rate of 24 per minute.  CARDIAC:  Regular rate and rhythm with a 1/6 systolic murmur at the left  sternal border.  ABDOMEN:  Soft, rotund, nondistended and the patient does not react with  wincing or guarding with deep palpations.  Bowel sounds are hypoactive.  There is no mass felt.  There is a soft bruit in the periumbilical and left  upper quadrant and right upper quadrant areas.  EXTREMITIES:  No clubbing, cyanosis or edema.  Dorsalis pedis and posterior  tibial pulses 2+ bilaterally.  NEUROLOGIC:  The patient moves all extremities.  No facial droop.  Tongue  midline.  Pupils equal round and reactive to light.  Mental status as stated  previously, unable to establish orientation.   LABORATORY DATA AND X-RAY FINDINGS:  CBC showed a white blood cell count of  7000, hemoglobin 12.2, platelets 160; differential with 22 lymphs and 64  neutrophils.  Sodium 128, potassium 3.2, chloride 93, bicarb 27, BUN 8,  creatinine 0.8, glucose 128, calcium 8.1.  A urine drug screen was positive  for benzodiazepines and opiates.  Catheterized urinalysis showed positive  blood with 11-20 blood cells per high power field, protein 100, specific  gravity 1.015, nitrite and leukocyte esterase negative.  Alcohol, salicylate  and acetaminophen level were all negative.  Portable chest x-ray showed poor  inspiration, question of right basilar atelectasis versus infiltrate.  No  pulmonary edema.   ASSESSMENT/PLAN:  1. Delirium.  I feel this is secondary to her acute infectious process     combined with polypharmacy and mild dehydration.  Will give time to allow     this to improve, but if it does not improve sufficiently will go ahead     and proceed with computed tomography scan of the head and also consider    further diagnostics to look into central nervous system infection.  Will  hold all  pain and anxiety medications at this point.  In addition, with     her history of coronary artery disease, will go ahead and check cardiac     enzymes and an electrocardiogram.  2. Pneumonia.  Have started Rocephin and Zithromax.  The patient is stable     and not requiring oxygen.  Will get a posterior/anterior and lateral     chest x-ray to further clarify pulmonary findings when the patient is     better able to go through this.  3. Hyponatremia.  Likely secondary to poor intake by mouth over the last     several days combined with the continued intake of diuretic.  4. Hypokalemia.  This is likely secondary to her diuretic and will replace     this in her intravenous fluids.  5. Diabetes.  Will monitor capillary blood glucoses every six hours for now     and hold diabetic medications.  6. Hypertension.  Blood pressure is normal.  Follow off medications for now.     ___________________________________________                                         Jeoffrey Massed, M.D.   PHM/MEDQ  D:  09/16/2003  T:  09/16/2003  Job:  [id number]

## 2010-09-15 NOTE — H&P (Signed)
NAMEJILLYAN, [name]             ACCOUNT NO.:  [id number]   MEDICAL RECORD NO.:  [id number]          PATIENT TYPE:  INP   LOCATION:  A314                          FACILITY:  [facility]   PHYSICIAN:  [name], M.D.DATE OF BIRTH:  1945-12-02   DATE OF ADMISSION:  09/10/2005  DATE OF DISCHARGE:  LH                                HISTORY & PHYSICAL   PRIMARY CARE PHYSICIAN:  [name].   ADMISSION DIAGNOSES:  1.  Acute gastroenteritis.  2.  Acute colitis, infectious versus ischemic.  3.  Obesity.  4.  Moderate dehydration.   CHIEF COMPLAINT:  Nausea and vomiting.   HISTORY OF PRESENT ILLNESS:  [name] is a 65 year old Caucasian female  who presented to the emergency room with complaints of nausea, vomiting and  abdominal cramping.  She says the abdominal pain started about 12 hours  prior to presentation, onset was fairly acute.  She says the pain has been  intermittent, mostly on her left flank radiating to the umbilical area.  It  was mostly  colicky, cramping and sharp,  7/10 and appears to be relieved  only spontaneously and also by pain medication in the emergency room.  The  patient has had anorexia, nausea and vomiting.  She also had about three  episodes of diarrhea earlier today.   On arrival in the emergency room, she was found to be severely dehydrated.  She also had a low-grade fever.  Due to her continued abdominal pain, a CT  scan of the abdomen and pelvis was obtained which was concerning for  colitis, likely infectious versus inflammatory versus ischemic.  The full  report is discussed below.   REVIEW OF SYSTEMS:  She denies any headaches or dizziness.  No ulcers in the  mouth.  No difficulty swallowing.  She has no frequency, urgency or dysuria.  She has had no recent weight loss.  The patient has never had this episode  of abdominal pain in the past.   PAST MEDICAL HISTORY:  1.  Significant for coronary artery disease, status post stent  placement.  2.  Type 2 diabetes.  3.  Coronary artery disease, status post CABG.  Cardiologist is [name]      Cardiology, Dr. Dietrich [name].  4.  Hypertension.  5.  Hyperlipidemia.  6.  Dizziness.  7.  Orthostatic hypotension.  8.  Anxiety.  9.  Depression.  10. Carotid endarterectomy.  11. Carpal tunnel release.  12. History of cervical laminectomy.  13. History of lumbar spine surgery.  14. History of hypothyroidism.   MEDICATIONS:  1.  Lipitor 10 mg p.o. once a day.  2.  Plavix 75 mg p.o. once a day.  3.  Lisinopril 10 mg p.o. once a day.  4.  Actos 30 mg p.o. once a day.  5.  Lasix 40 mg p.o. once a day.  6.  Amitriptyline 100 mg once a day.  7.  Alprazolam 1 mg p.o. q.6h. p.r.n. for anxiety.  8.  Metoprolol 25 mg p.o. once a day.  9.  Fluvoxamine maleate 100 mg.   ALLERGIES:  She has  no known drug allergies.   SOCIAL HISTORY:  She is married.  Husband was present during this interview.  She smokes about a pack of cigarettes a day.  Drinks alcohol occasionally.  Otherwise noncontributory.  No sick contacts.   FAMILY HISTORY:  Positive for hypertension and diabetes in parents.   PHYSICAL EXAMINATION:  GENERAL:  Conscious, alert.  The patient is in mild  pain and distress.  VITAL SIGNS:  On arrival in the emergency room, blood pressure was 101/49,  pulse 90, respiration 24, temperature 99.3 degrees.  Oxygen saturation was  95% on room air.  HEENT:  Normocephalic and atraumatic.  Oral mucosa was dry.  No exudates  were noted.  NECK:  Supple.  No JVD, no lymphadenopathy.  LUNGS:  Clear clinically.  Good air entry bilaterally.  HEART:  S1 and S2 regular.  No S3, S4, gallops or rubs.  ABDOMEN:  Soft and obese.  The patient had diffuse tenderness in the left  upper quadrant down to the left lower quadrant.  There was no erythema  noticed on that flank.  There was no rebound or guarding.  The patient  appeared to have significant tenderness on palpation.  Bowel sounds were   positive.  EXTREMITIES:  No pitting or pedal edema.  No calf induration or tenderness  was noted.  CNS:  Grossly intact.   LABORATORY DATA:  White blood cell count was 10, hemoglobin of 12.9,  hematocrit of 7.6, platelet count was 174,000.  Neutrophils were 85%.  Sodium is 131, potassium 4.4, chloride of 99, CO2 25, BUN 33, creatinine was  1.4, glucose was 129, calcium 9.1.  Cardiac enzymes were negative.  Urinalysis was negative.  A CT scan of the abdomen and pelvis indicates  thick-walled left upper sigmoid colon suggestive of colitis, inflammatory  versus ischemic.  There was no free air noted.  No obstruction was noted.  No urinary tract stones or obstruction was noted.   ASSESSMENT/PLAN:  [name] is a 65 year old Caucasian female who presents  with nausea, vomiting, abdominal pain and diarrhea.  CT scan of the abdomen  and pelvis does reveal evidence of sigmoid colitis.  The etiology is  unclear.  Perhaps more likely to be infectious like on an acute viral  picture rather than ischemic.  I will check a venous lactate level on her to  start to rule out the possibility of bowel ischemia.  She will be maintained  on clear liquids only as tolerated.  We will hydrate her.  The patient has  received Cipro and Flagyl in the emergency room and I will continue on that  for now.  Request GI to see her to help assist with her current plans.  We  will try to hydrate to see if maybe her creatinine will improve.   I have discussed the above plan with her and she verbalized understanding.  I will resume all her other medications especially her cardiac medications  including Plavix at this time.  I will try to avoid any of her medications  that may significantly cause hypotension.  We will hold Lisinopril and the  metoprolol and the diuretic Lasix for now.  I believe this can be started  once the patient is more stable.  We will monitor her blood pressure and  hemodynamic parameters  closely.      [name], M.D.  Electronically Signed     AM/MEDQ  D:  09/11/2005  T:  09/11/2005  Job:  [id number]

## 2010-09-15 NOTE — Procedures (Signed)
NAME:  KHLOEE, GARZA                       ACCOUNT NO.:  [id number]   MEDICAL RECORD NO.:  [id number]                   PATIENT TYPE:  INP   LOCATION:  A315                                 FACILITY:  [facility]   PHYSICIAN:  Soper Bing, M.D.               DATE OF BIRTH:  1945/07/06   DATE OF PROCEDURE:  DATE OF DISCHARGE:                                  ECHOCARDIOGRAM   CLINICAL DATA:  A 65 year old woman with a history of coronary artery  disease now presenting with altered mental status and abnormal cardiac  markers.   M-MODE:  1. Aorta 3.3.  2. Left atrium 4.4.  3. Septum 1.5.  4. Posterior wall 1.1.  5. LV diastole 4.5.  6. LV systole 3.6.   FINDINGS:  1. Technically-adequate echocardiographic study.  2. Mild left atrial enlargement; normal right atrium and right ventricle.  3. Normal aortic valve; aortic root at the upper limit of normal; aortic     annular calcification present.  4. Normal mitral valve; very mild regurgitation.  5. Normal tricuspid valve; trivial regurgitation; estimated RV systolic     pressure not ideally defined, but appears normal.  6. Pulmonic valve not well imaged, probably normal.  7. Normal inferior vena cava.  8. Normal internal dimension of the left ventricle; normal regional and     global function.  9. Comparison with prior study of July 03, 2002, improved technical quality;     probably no significant interval change.      ___________________________________________                                            Isanti Bing, M.D.   RR/MEDQ  D:  09/17/2003  T:  09/17/2003  Job:  [id number]

## 2010-09-15 NOTE — Consult Note (Signed)
NAMEBRITT, [name]             ACCOUNT NO.:  [id number]   MEDICAL RECORD NO.:  [id number]          PATIENT TYPE:  INP   LOCATION:  2316                         FACILITY:  [facility]   PHYSICIAN:  Iva Boop, M.D. LHCDATE OF BIRTH:  01/18/1946   DATE OF CONSULTATION:  09/12/2005  DATE OF DISCHARGE:                                   CONSULTATION   REQUESTING PHYSICIAN:  Dr. Shan Levans   REASON FOR CONSULTATION:  Colitis.   ASSESSMENT:  Left-sided colitis based upon CT findings and clinical  scenario.  Interestingly, she has not really had any diarrhea.  She suffers  from chronic constipation.  It does seem most likely to be ischemic given  the distribution.  An infection is possible.  She has a fair amount of  abdominal tenderness and guarding in the left lower quadrant and right lower  quadrant.  The right lower quadrant pain could be related to a sigmoid colon  extending into the right lower quadrant.   She has had dizzy spells and presyncope, it sounds like, for the past month,  not vertiginous, just lightheadedness.  Whether or not there has been some  hypotension related with that that could have triggered this versus spasm of  the colon or other cause since she has coronary artery disease and probably  has some vascular disease of her mesenteric circulation is not clear.   She had hypotension and shock issues at [facility] that may very  well have been due to Dilaudid and not her colitis, though she was probably  somewhat hypovolemic.   RECOMMENDATIONS AND PLAN:  1.  Continued supportive care with antibiotics, hydration, pain control.  2.  She should have a colonoscopy before she leaves the hospital.  She might      be ready in a day or two.  I will reassess.   HISTORY:  A 65 year old white woman admitted yesterday after she was at  [facility] with a diagnosis of ischemic colitis.  She had nausea  and vomiting and abdominal pain but she had  obstipation, intermittent  progressive flank pain.  She has had a couple of dizzy spells and  presyncopal spells this month and actually felt that way after she developed  sudden onset of pain at this presentation to [facility] about three  or four days ago.  Her last bowel movement was a small one several days ago.  She can go up to a week without bowel movements and occasionally see some  bright red blood with hard stools when she strains.  She feels much better  at this point.  Yesterday she did get shocky and have hypotension with blood  pressure in the 60 systolic range at one point.  Her hemoglobin was 12.9,  went down to 10 with hydration.   CT scanning demonstrated a thickened descending and sigmoid colon extending  down into the pelvis consistent with a colitis.  Hemoglobin today is now  9.4.   ALLERGIES:  None known.   MEDICATION HERE IN THE HOSPITAL:  1.  Plavix 75 mg daily.  2.  Sliding scale insulin.  3.  Protonix 40 mg IV q.24h.  4.  Lovenox.  5.  Dilaudid 7.5 mg PCA.  6.  Cipro 400 mg IV every eight hours.  7.  Metronidazole 500 mg IV every eight hours.   PAST MEDICAL HISTORY:  1.  Coronary artery disease status post coronary artery bypass grafting as      well as stenting five years ago.  2.  Diabetes mellitus type 2.  3.  Obesity.  4.  Hypertension.  5.  Anxiety.  6.  Depression.  7.  Prior carotid endarterectomy.  8.  Carpal tunnel release.  9.  Cervical laminectomy.  10. Lumbar spine surgery.  11. History of hypothyroidism.   HOME MEDICATIONS:  Lipitor, lisinopril, Actos, Lasix, amitriptyline, an  anxiolytic, metoprolol, and fluvoxamine.   SOCIAL HISTORY:  She is married.  Still smokes a pack a day.  Occasional  alcohol.   FAMILY HISTORY:  Positive for hypertension and diabetes.   REVIEW OF SYSTEMS:  She has had some bruises on the skin.  She wears  eyeglasses.  She has upper and lower dentures.  She had some urinary  frequency and  difficulty.  She sometimes drinks beer to urinate.  She has  had these dizzy spells with lightheadedness and presyncope.  She has had  dyspnea on exertion.  She has insomnia, taking medications to sleep.  All  other systems negative or as described above.   PHYSICAL EXAMINATION:  GENERAL:  Obese, pleasant, middle-aged woman  appearing somewhat older than stated age.  VITAL SIGNS:  Temperature 99.1, blood pressure 104/44, respirations 24,  pulse 96.  HEENT:  Eyes anicteric.  Mouth free of lesions.  NECK:  Supple.  No mass, thyromegaly.  CHEST:  Clear.  HEART:  S1, S2.  No rubs or gallops.  ABDOMEN:  Obese.  She has left lower quadrant more so than right lower  quadrant with some mild guarding.  Bowel sounds are present.  There is no  bruit heard.  There is no organomegaly or mass.  Prior surgical scars are  noted.  RECTAL:  Small amount of fresh blood and some external hemorrhoids.  EXTREMITIES:  No clubbing, cyanosis, edema seen.  PSYCH:  She is alert and oriented x3.  She appears slightly anxious.  Cranial nerves II-XII grossly intact.  LYMPH NODES:  I detect no neck or supraclavicular nodes.   LABORATORY DATA:  Today shows hemoglobin 9.4, white count 6.5, platelets  108.  INR 1.5.  BMET looks normal.  Glucose 89.  Cardiac enzymes are  negative.   I appreciate the opportunity to care for this patient.      Iva Boop, M.D. [facility]  Electronically Signed     CEG/MEDQ  D:  09/12/2005  T:  09/13/2005  Job:  [id number]   cc:   Francoise Schaumann. Milford Cage DO, FAAP  Fax: [phone]

## 2010-09-15 NOTE — Consult Note (Signed)
NAME:  [name], [name]                       ACCOUNT NO.:  [id number]   MEDICAL RECORD NO.:  [id number]                   PATIENT TYPE:  INP   LOCATION:  A315                                 FACILITY:  [facility]   PHYSICIAN:  Ashkum Bing, M.D.               DATE OF BIRTH:  05/14/1945   DATE OF CONSULTATION:  09/16/2003  DATE OF DISCHARGE:                                   CONSULTATION   CARDIOLOGY CONSULTATION   REFERRING PHYSICIAN:  Francoise Schaumann. Halm, D.O.   CARDIOLOGIST:  Vida Roller, M.D.   HISTORY OF PRESENT ILLNESS:  A 65 year old woman with known coronary artery  disease, having undergone CABG surgery approximately 10 years ago, now  referred for evaluation of elevated troponins.  [name] has been  relatively stable with respect to her heart disease.  She underwent coronary  angiography in March 2004 which revealed severe 3 vessel disease and  occluded grafts to a circumflex marginal and the right coronary artery.  There was a patent LIMA graft to the LAD and a saphenous vein graft to a  diagonal. She did have __________  disease in a first diagonal.  Nonetheless, a Cardiolite indicated no ischemia, and normal left ventricular  systolic function.   [name] has had respiratory symptoms including cough and fever for the  past 2 weeks.  A chest x-ray the day prior to admission suggested a right-  sided pneumonia.  She developed increasing fever and decreasing mental  status prompting presentation to the emergency department.  She also has had  anorexia.  She has had a productive cough, but no impressively purulent  sputum.   PAST MEDICAL HISTORY:  Past medical history is notable for hypertension,  diabetes, hyperlipidemia, cerebrovascular accident with prior left carotid  endarterectomy, anxiety, depression, and hypothyroidism.   PAST SURGICAL HISTORY:  Surgical procedures have included carpal tunnel  release, cervical laminectomy and lumbar spinal surgery.   MEDICATIONS:  1. Furosemide 40 mg daily.  2. Atorvastatin 10 mg daily.  3. Clopidogrel 75 mg daily.  4. Aspirin 81 mg daily.  5. KCl 20 mEq b.i.d.  6. Zoloft 100 mg daily.  7. Amitriptyline 200 mg q.p.m.  8. Xanax 2 mg t.i.d.  9. Ambien 10 mg q.h.s.  10.      Actos 15 mg daily.  11.      Vicodin p.r.n.  12.      Atenolol was previously prescribed, but the patient is not     currently taking it.   SOCIAL HISTORY:  Lives in [place] with her husband; 40-to-50-pack-year  history of cigarette smoking; denies the excessive use of alcohol.   FAMILY HISTORY:  Both parents had premature atherosclerosis and myocardial  infarction.   REVIEW OF SYSTEMS:  Recent rigors; frontal headache of the past 2 weeks;  increasing dyspnea on exertion; decreased urinary frequency; generalized  weakness with myalgias, particularly in the back.  All other systems  reviewed and are negative.   PHYSICAL EXAMINATION:  GENERAL:  On exam a pleasant woman with somewhat  slurred speech but who is alert and responsive and in no acute distress.  VITAL SIGNS:  Temperature 98, heart rate 72 and regular, respirations 20,  blood pressure 105/60, capillary blood glucose 139.  HEENT:  Anicteric sclerae.  NECK:  No jugular venous distention; no carotid bruits.  LUNGS:  Rales throughout the right lung field.  CARDIAC:  Normal first and second heart sounds; fourth heart sound present.  ABDOMEN:  Soft and nontender; no bruits; no organomegaly.  EXTREMITIES:  Distal pulses intact; no edema.  NEUROMUSCULAR:  Symmetric strength and tone.  MUSCULOSKELETAL:  No joint deformities.  SKIN:  No significant lesions.   CHEST X-RAY:  Poor inspiration, right basilar atelectasis versus infiltrate.   EKG:  Sinus rhythm, T wave inversions in lead V2-3.   OTHER LABORATORY:  Notable for white count of 7000, hemoglobin of 12.2.  Sodium 128, potassium of 3.2, normal renal function, normal total CPK and  CPK/MB and a single troponin  of 0.15 followed by a normal troponin.  A drug  screen demonstrated benzodiazepines and opiates both of which are  prescribed.   IMPRESSION:  1. [name] presents with an elevated troponin in the setting of a     potential pulmonary infection.  These 2 are likely related.  Despite some     minor new EKG abnormalities, there is no compelling evidence for an acute     coronary syndrome.  I do not think that she needs any further cardiac     testing nor any additional cardiac medication.  Prophylactic low-     molecular weight heparin would be appropriate.  2. The patient's mental status impairment is likely multifactorial.  She had     fairly high fever.  She has an acute illness.  She is receiving multiple     psychoactive drugs and may have taken too much or too little.  This issue     seems to be improving without specific intervention.  3. Hypertension is currently well-controlled.  4. Electrolyte disturbances consistent with decreased oral intake and     replacement of fluids with hypotonic solutions.   We will be happy to follow this nice woman with you in hospital and plan for  continuing outpatient follow up.     ___________________________________________                                            [place] Bing, M.D.   RR/MEDQ  D:  09/16/2003  T:  09/17/2003  Job:  [id number]

## 2010-09-15 NOTE — Discharge Summary (Signed)
NAMEEARNESTINE, [name]             ACCOUNT NO.:  [id number]   MEDICAL RECORD NO.:  [id number]          PATIENT TYPE:  INP   LOCATION:  6733                         FACILITY:  [facility]   PHYSICIAN:  Lonia Blood, M.D.       DATE OF BIRTH:  [birth year]   DATE OF ADMISSION:  09/11/2005  DATE OF DISCHARGE:  09/18/2005                                 DISCHARGE SUMMARY   PRIMARY CARE PHYSICIAN:  Francoise Schaumann. Halm, DO, FAAP, [place], [place]   DISCHARGE DIAGNOSES:  1.  Ischemic colitis, resolved.  2.  Dehydration, resolved.  3  Hypotension, resolved.  1.  Extensive atherosclerosis.  2.  Hypokalemia, resolved.  3.  Hypomagnesemia, resolved.  4.  Anxiety depression.  5.  Diabetes mellitus.  6.  Hyperlipidemia.   DISCHARGE MEDICATIONS:  1  Lipitor 10 mg daily.  1.  Plavix 75 mg daily.  2.  Fluvoxamine 190 mg daily.  3.  Elavil 100 mg daily.  4.  Protonix 40 mg daily.  5.  Multivitamin daily.  6.  Actos 30 mg daily.  7.  Alprazolam 0.5 mg every 6 hours as needed for anxiety.  8.  Chantix 0.5 mg daily   CONDITION ON DISCHARGE:  The patient was discharged in good condition. At  time of discharge she was instructed to follow up with her primary care  physician, Dr. Milford Cage, in 1-2 weeks.   PROCEDURES:  For complete list of procedures in this hospitalization refer  to previously dictated discharge summary done by Dr. Corky Downs.   CONSULTATIONS DURING THIS ADMISSION:  Ms. Jokerst was seen in consultation  by the [facility] as well as by Dr. Edilia Bo from  cardiovascular and thoracic surgeons.  For admission history and physical,  refer to the dictated H&P done on Sep 11, 2005 by Dr. Sherle Poe.  For  complete hospital course covering the period from Sep 11, 2005 until Sep 16, 2005, refer to the dictated discharge summary done by Dr. Corky Downs.  For  hospital course covering the period of Sep 17, 2005, until Sep 18, 2005  during this period of time, Ms. Flett has made  steady progress.  Her diet  was advanced to a point where the patient was able tolerate a regular diet.  The patient did not have any recurrent abdominal pain, and her vital signs  remained stable.  The patient was seen on a daily basis by Dr. Edilia Bo as  well as by Dr. Juanda Chance from [facility] and the patient was  deemed stable for discharge.  The patient had evaluation of her carotids as  well as evaluation for possible peripheral vascular disease, but there were  no significant atherosclerotic lesions found.  The patient was discharged  home in good condition on Sep 18, 2005.  She was instructed to follow up  with her primary care physician.  At the time of discharge the patient was  strongly urged to quit smoking.  She was provided with Chantix takes to help  her with her desire to smoke.      Lonia Blood, M.D.  Electronically Signed  SL/MEDQ  D:  09/18/2005  T:  09/19/2005  Job:  [id number]   cc:   Francoise Schaumann. Milford Cage DO, FAAP  Fax: [phone]   Di Kindle. Edilia Bo, M.D.  [address]  [place]  [place] [address]   Iva Boop, M.D. [facility]  [address] [place], [place] [address]

## 2010-09-15 NOTE — Cardiovascular Report (Signed)
NAME:  [name], [name]                       ACCOUNT NO.:  [id number]   MEDICAL RECORD NO.:  [id number]                   PATIENT TYPE:  OIB   LOCATION:  2020                                 FACILITY:  [facility]   PHYSICIAN:  Salvadore Farber, M.D. [facility]         DATE OF BIRTH:  1946/01/29   DATE OF PROCEDURE:  07/15/2002  DATE OF DISCHARGE:                              CARDIAC CATHETERIZATION   PROCEDURE:  Right and left heart catheterization, left ventriculography,  coronary angiography, saphenous vein graft angiography x2, left subclavian  angiography, selective left internal mammary artery angiography.   CARDIOLOGIST:  Salvadore Farber, M.D.   INDICATIONS:  The patient is a 65 year old lady with coronary artery disease  status post coronary artery bypass grafting in 1997 and subsequent  rotablation and stenting of the circumflex in both the AV groove and the  first obtuse marginal at [facility] in 2001.  Over the past several  months, she has had lightheadedness which she has attributed to dehydration.  Her Lasix was recently stopped, and she presented in congestive heart  failure.  She was referred for diagnostic angiography and right heart  catheterization.   DESCRIPTION OF PROCEDURE:  Informed consent was obtained.  Under 1%  lidocaine local anesthesia, an 8 French sheath was placed in the right  femoral vein and a 6 French sheath in the right femoral artery using the  modified Seldinger technique.  Right heart catheterization was performed  using a balloon tipped catheter.  Cardiac output was determined using the  thermodilution method.  Pigtail catheter was advanced into the ventricle.  Ventriculography was performed using power injection.  Coronary angiography  was then performed using JL4 and JR4 catheters for the native coronaries,  JL4 for the vein grafts to the diagonal, JL4 to selectively engage the left  subclavian.  The catheter was exchanged over a wire to a  LIMA catheter which  was selectively engaged in the LIMA.  A RCV catheter was then used to  selectively engage the vein graft to the RCA.  The patient tolerated the  procedure well and was transferred to the holding room in stable condition.  Sheaths were removed there.   COMPLICATIONS:  None.   RESULTS AND FINDINGS:  1. Hemodynamics:  RA 17, RV 42/7/15, PA 40/23/30.  DCW 25/25/25.  LV     156/11/25.  Cardiac out put 3.0.  There is no aortic stenosis on pullback.  1. Ventriculography:  EF 50% without regional wall motion abnormality.  1+     mitral regurgitation.   CORONARY ANGIOGRAPHY:  1. Left main:  The left main has mild disease.  2. Left anterior descending:  The left anterior descending is a large vessel     giving rise a moderate size first and larger second diagonal branch.     There is an 80% stenosis of the proximal LAD limiting flow into the first     diagonal branch.  There is 90% stenosis of the mid vessel before the     second diagonal branch.  The saphenous vein graft to the diagonal was     widely patent with normal proximal and distal anastomoses and good     outflow.  3. The left internal mammary artery to the LAD is widely patent with normal     distal anastomoses and excellent outflow.  There is no left subclavian     stenosis.  4. Circumflex:  The circumflex is a moderate size vessel giving rise to a     single large obtuse marginal branch and a smaller second obtuse marginal     branch.  The patient has previously had rotablator therapy at the site of     both the AV groove circumflex and first obtuse marginal branch with     stenting of each.  The stent extending from the proximal circumflex into     the first obtuse marginal is patent with approximately 30% diffuse in-     stent restenosis.  There is a Y-stent in the AV groove circumflex.  At     the ostium of this vessel there is focal 90% restenosis.  There is     diffuse 50% restenosis of this stent.  The  territories that are served by     this vessel was relatively modest.  The saphenous vein graft to the     obtuse marginal was occluded as previously documented.  5. Right coronary artery:  The RCA was a diffusely diseased vessel,     particularly in the proximal and mid portions.  There is focal 80%     stenosis of the mid vessel.  6. The saphenous vein graft to the right coronary artery is occluded.   IMPRESSION/RECOMMENDATIONS:  1. Markedly elevated left ventricular end-diastolic pressure.  2. Preserved left ventricular systolic function without regional wall motion     abnormality.  3. Mitral regurgitation of 1+.  No aortic stenosis.  4. The patient has multiple potential regions of ischemia, most particularly     the first diagonal branch, right coronary artery territory and distal     lateral wall.  However, with no clear symptoms of ischemia and     predominant heart failure I favor medical therapy for her congestive     heart failure.  Outpatient Cardiolite would then be obtained to further     guide me to ischemic therapy.                                               Salvadore Farber, M.D. [facility]    WED/MEDQ  D:  07/15/2002  T:  07/17/2002  Job:  [id number]   cc:   Rosalio Macadamia  [address] [place]  [place]  [place] [address]  Fax: [phone]

## 2010-09-15 NOTE — Procedures (Signed)
NAMECARISA, BACKHAUS             ACCOUNT NO.:  [id number]   MEDICAL RECORD NO.:  [id number]          PATIENT TYPE:  OUT   LOCATION:  RAD                           FACILITY:  [facility]   PHYSICIAN:  Dundee Bing, M.D.  DATE OF BIRTH:  1945/09/06   DATE OF PROCEDURE:  06/29/2004  DATE OF DISCHARGE:                                  ECHOCARDIOGRAM   REFERRING PHYSICIANS:  1.  Dr. Stephania Fragmin.  2.  Dr. Dorethea Clan.   CLINICAL DATA:  A 65 year old woman with palpitations and prior CABG  surgery.  Aorta 3, left atrium 4.8, septum 1.2, posterior wall 1.1, LV  diastole 4.7, LV systole 3.2.   RESULTS:  1.  Technically adequate echocardiographic study.  2.  Mild left and right atrial enlargement;  normal right ventricular size      and function.  3.  Mild aortic valvular sclerosis and annular calcification;  normal aortic      root size at the annulus, but mild dilatation of the remainder of the      ascending aorta.  4.  Normal mitral valve;  mild annular calcification;  mild regurgitation.  5.  Normal tricuspid valve;  mild regurgitation;  estimated right      ventricular systolic pressure at the upper limit of normal.  6.  Normal pulmonic valve and proximal pulmonary artery.  7.  Normal left ventricular size;  mild hypertrophy;  normal function.  8.  Normal inferior vena cava.  9.  Comparison with Sep 17, 2003:  Right atrial enlargement and left      ventricular hypertrophy now present.      RR/MEDQ  D:  06/30/2004  T:  06/30/2004  Job:  [id number]

## 2010-09-15 NOTE — Procedures (Signed)
[name], [name]             ACCOUNT NO.:  [id number]   MEDICAL RECORD NO.:  [id number]          PATIENT TYPE:  OUT   LOCATION:  SLEEP LAB                     FACILITY:  [facility]   PHYSICIAN:  Marcelyn Bruins, M.D. [facility] DATE OF BIRTH:  [birth year]   DATE OF STUDY:  11/19/2005                              NOCTURNAL POLYSOMNOGRAM   REFERRING PHYSICIAN:  Dr. Acey Lav   INDICATION FOR THE STUDY:  Hypersomnia with sleep apnea.  Epworth score:  21.   SLEEP ARCHITECTURE:  The patient had a total sleep time of 253 minutes with  very little REM and never achieved slow wave sleep.  Sleep onset latency was  mildly prolonged at 33 minutes, and REM onset was very prolonged at 320  minutes.  Sleep efficiency was decreased at 66%.   RESPIRATORY DATA:  The patient underwent split night study where they were  found to have 62 obstructive events in the first 123 minutes of sleep.  This  gave the patient a Respiratory Disturbance Index of 30 events per hour  during the first half of the night.  The events were not positional, however  there was moderate snoring noted throughout.  By protocol the patient was  placed on a small Respironics full face mask and ultimately titrated to a  final pressure of 7 cm H2O.  There was excellent control even through supine  REM on this setting.   OXYGEN DATA:  There was O2 desaturation as low as 86% with the patient's  obstructive events.   CARDIAC DATA:  No clinically significant cardiac arrhythmias were noted.   MOVEMENT/PARASOMNIA:  The patient was found to have 406 leg jerks with four  per hour resulting in arousal or awakening.   IMPRESSION/RECOMMENDATION:  1.  Split night study reveals moderate obstructive sleep apnea with a      Respiratory Disturbance Index of 30 events per hour during the first      half of the night with oxygen desaturation as low as 86%.  The patient      was then placed on a small Respironics full face mask and titrated to a  final pressure of 7 cm H2O.  2.  Very large numbers of leg jerks with what appears to be significant      sleep disruption.  It is unclear how much of this is related to the      patient's sleep-disordered breathing, or whether they may have a      movement disorder of      sleep.  Clinical correlation is suggested if the patient fails to      respond to optimal continuous positive airway pressure therapy.                                            ______________________________  Marcelyn Bruins, M.D. [facility]  Diplomate, American Board of Sleep  Medicine     KC/MEDQ  D:  11/23/2005 17:18:46  T:  11/23/2005 21:40:51  Job:  [id number]

## 2010-09-15 NOTE — Discharge Summary (Signed)
NAMEJULIAHNA, [name]             ACCOUNT NO.:  [id number]   MEDICAL RECORD NO.:  [id number]          PATIENT TYPE:  INP   LOCATION:  6733                         FACILITY:  [facility]   PHYSICIAN:  Mobolaji B. Bakare, M.D.DATE OF BIRTH:  08-24-1945   DATE OF ADMISSION:  09/11/2005  DATE OF DISCHARGE:                                 DISCHARGE SUMMARY   This is an interim discharge summary.   PRIMARY CARE PHYSICIAN:  Francoise Schaumann. Halm, DO, FAAP   DIAGNOSES:  1.  Ischemic colitis.  2.  Gastroenteritis with volume depletion.  3.  Hypertension, resolved.  4.  Vasculopathy.  5.  Hypokalemia, repleted.  6.  Hypomagnesemia, repleted.   SECONDARY DIAGNOSES:  1.  Anxiety/depression.  2.  Diabetes mellitus.  3.  History of hypertension, medications currently on hold secondary to      borderline blood pressure.  4.  Hyperlipidemia.   OPERATION/PROCEDURE:  1.  CT scan abdomen and pelvis done on Sep 10, 2005 at [facility]      showed inflammatory changes associated with sigmoid and left colon,      ischemic versus infectious colitis.  2.  Chest x-ray done at [facility] on Sep 11, 2005, showed mild      pulmonary vascular congestion and COPD.  3.  Chest x-ray done at [facility] posterior catheter CVP      placement.  Showed no pneumothorax.  4.  CT scan angiogram of abdomen and pelvis showed significant stenosis of      the celiac atrophy.  Inferior mesenteric origin was occluded.  Inferior      mesenteric celiac stenting was felt not to be half full.  5.  Bilateral renal artery stenosis.  6.  Bilateral common iliac artery stenosis.  Descending colon thickness was      slightly improved.  7.  Chest x-ray done on Sep 15, 2005 showed small bilateral pleural      effusions and stable cardiomegaly and postoperative changes.  8.  Colonoscopy done Sep 14, 2005 by Dr. Leone Payor showed ischemic colitis at      the splenic flexure origin.  Normal distal to the proximal  descending      colon.  9.  Carotid Dopplers which were completed on May 20, 200 and showed mild      plaque at origin and proximal internal carotid artery.  No significant      carotid artery stenosis noted bilaterally.   BRIEF HISTORY:  Mrs. [name] is a 65 year old Caucasian female who was  initially admitted at [facility] for nausea, vomiting, and  dehydration.  CT scan done at [facility] revealed colitis,  infectious versus ischemia.  The patient was admitted there and she  subsequently dehydrated and became hypotensive and shocked.  She was  resuscitated and transferred to [facility]  ICU on the service of  Critical Care.  Given the distribution of the colitis, it was felt most  likely ischemic in nature.  GI consult was obtained.   HOSPITAL COURSE:  Problem #1.  Hypotension/shock/severe sepsis.  Mrs [name]  was admitted into the intensive care unit.  Central line monitoring was  obtained.  She was volume resuscitated.  She did not receive Xigris. She was  started on ciprofloxacin and Flagyl.  In addition she received pressor  support with Levophed.  Surgical consult was obtained.  She was found not to  be a surgical candidate at that point.  However, if she deteriorates, she  would become a candidate for surgery.  The patient improved enough to  undergo colonoscopy.   Problem #2.  Ischemic colitis.  She underwent colonoscopy on Sep 14, 2005.  Results are noted above.  Clearly the changes were compatible with ischemic  colitis.  The patient was given supportive treatment.  Initially she was NPO  and she currently tolerated clear liquids which was started on May 18 after  colonoscopy.  She is currently on full liquid diet but has developed nausea  and vomiting post feeding.  The plan by GI is to do endoscopy if she  continues to have vomiting.  She does not have diarrhea.  She never did have  diarrhea during the course of this illness.   Problem #3.   Vasculopathy.  The patient was noted to have multiple stenotic  and occluded vessels as noted on the CT scan angiogram of the abdomen and  pelvis.  She had carotid Doppler done which did not show any significant  stenosis.  ABI is pending at this time.  Bilateral pedal pulses are  palpable.  CVTS was consulted regarding this multiple stenosis and ischemic  colitis.  She is not deemed to be a surgical candidate.  Indicates that she  has bilateral calf and thigh claudication for years and she can walk one  block for having symptoms which results to be ischemic colitis.  It was felt  that addressing the celiac stenosis would not significantly improve  perfusion to the sigmoid colon.  It is to be noted that she never did have  postprandial abdominal pain overall.  She is not deemed to have a surgical  intervention necessary at this time.  However, ABI studies are pending.  She  is currently on Lipitor. Fasting lipid profile pending.   Problem #4.  Left costal and lateral chest wall musculoskeletal pain.  Chest  x-ray done did not reveal any abnormalities or fracture.  She was given  analgesic and she seemed to be improving.   Problem #5. Hyperlipidemia.  The patient was continued on Lipitor.   Problem #6.  Diabetes mellitus.  Blood glucose was never controlled because  the patient was NPO for the most part. She has now started p.r.n. liquids.  Oral hypoglycemic agents will be introduced as dietary intake improved.  Hemoglobin A1C is pending.   Problem #7.  Hypokalemia/hypomagnesemia.  This was repleted.   She will need continued medical management of her risk factors including  hyperlipidemia, diabetes, and hypertension.   Addendum will added to this dictation at the time of discharge.      Mobolaji B. Corky Downs, M.D.  Electronically Signed     MBB/MEDQ  D:  09/16/2005  T:  09/16/2005  Job:  [id number]

## 2010-09-15 NOTE — Procedures (Signed)
   NAME:  [name], [name]                       ACCOUNT NO.:  [id number]   MEDICAL RECORD NO.:  [id number]                   PATIENT TYPE:  OUT   LOCATION:  RAD                                  FACILITY:  [facility]   PHYSICIAN:  Benedict Bing, M.D. [facility]           DATE OF BIRTH:  [birth year]   DATE OF PROCEDURE:  DATE OF DISCHARGE:                                  ECHOCARDIOGRAM   CLINICAL DATA:  The patient is a 65 year old woman with a history of  arrhythmia, hypertension, diabetes and prior CABG surgery.   M-MODE MEASUREMENTS:  Aortic 3.1, left atrium 4.5, septum 1.2, posterior  wall 0.9, LV diastole 4.4, LV systole 3.1.   IMPRESSION:  1. Technically is somewhat suboptimal but adequate echocardiographic study.  2. Very mild left atrial enlargement; normal right atrium and right     ventricle.  3. Minimal aortic valvular sclerosis with normal function.  4. Normal mitral valve with trivial regurgitation.  5. Normal tricuspid valve with minimal regurgitation; estimated right     ventricular systolic pressure is normal.  6. Normal pulmonic valve.  7. Normal internal dimension of the left ventricle with borderline left     ventricular hypertrophy.  Not all myocardial segments adequately imaged -     especially poorly seen with the anterolateral region.  Overall left     ventricular systolic function appeared normal.  8. Normal inferior vena cava.                                               [place] Bing, M.D. [facility]    RR/MEDQ  D:  07/02/2002  T:  07/03/2002  Job:  [id number]

## 2010-09-15 NOTE — Discharge Summary (Signed)
NAME:  [name], [name]                       ACCOUNT NO.:  [id number]   MEDICAL RECORD NO.:  [id number]                   PATIENT TYPE:  INP   LOCATION:  A315                                 FACILITY:  [facility]   PHYSICIAN:  Jeoffrey Massed, M.D.             DATE OF BIRTH:  08/27/1945   DATE OF ADMISSION:  09/16/2003  DATE OF DISCHARGE:  09/20/2003                                 DISCHARGE SUMMARY   ADMISSION DIAGNOSES:  1. Pneumonia.  2. Mental status changes.  3. Abnormal EKG and elevated troponin.   DISCHARGE DIAGNOSES:  1. Pneumonia.  2. Mental status changes, resolved.  3. Abnormal EKG and elevated troponin - no evidence of acute coronary     syndrome.   DISCHARGE MEDICATIONS:  1. Doxycycline 100 mg p.o. b.i.d. x7 days.  2. Actos 15 mg daily.  3. Aspirin 325 mg daily.  4. Zoloft 100 mg daily.  5. Amitriptyline 100 mg q.h.s.  6. Alprazolam 1-2 mg q.8h.   We have plans to cut back further on amitriptyline after seeing her in the  office in follow-up in two weeks.  We will also restart Lipitor at that  time.   CONSULTATIONS:  Cardiology:  Dr. Dietrich Pates evaluated the patient on Sep 16, 2003.   PROCEDURES:  A 2-D echocardiogram on Sep 16, 2003, which showed no wall  motion abnormalities, no significant valvular regurgitation or stenosis, and  normal left ventricle and right ventricle dimensions.  Mild left atrial  enlargement was seen.  There was no significant interval change compared to  a study of July 03, 2002.   HISTORY OF PRESENT ILLNESS:  For complete H&P please see dictated H&P in  chart.  Briefly this is a 65 year old white female with a significant past  medical history of coronary artery disease, diabetes, and severe anxiety and  depression, who presented with altered mental status.  She had been in the  midst of a 2-3 week respiratory illness which had begun to worsen and the  night prior to admission, had ingested a sleeping pill on top of her usual  psychotropic medications.  She presented to the emergency department with  altered mental status and she was admitted for further evaluation and  management.   HOSPITAL COURSE:  1. PNEUMONIA:  Ms. Prevost had several day history of fever and had right     basilar crackles on exam, consistent with consolidation.  Two days prior     to admission she was begun on Ketac 800 mg daily.  This was discontinued     in the hospital and she was begun on IV Rocephin and Zithromax.  She had     a normal room air O2 saturation admission and throughout the     hospitalization.  She defervesced and had gradual improvement of her     cough and was changed to oral antibiotics.  She continued to gradually  improve and was discharged home on a previously mentioned antibiotic.   1. ALTERED MENTAL STATUS:  Ms. Paget has chronic anxiety and takes 1-2 mg     of Xanax every eight hours in addition to 200 mg of amitriptyline at     bedtime.  She had been febrile up to 104 on the day of admission and had     also had significant negative interactions with family members that     caused her to stress.  She then was given one more sleeping pill.  She     was then noted by her daughter to be out of it and this was what     prompted calling 911.  On assessment in the ER the patient was difficult     to arouse and once she was aroused, she could not attend to a     conversation and was disoriented.  She moved all extremities equally.     She was unable to be sedated enough to go into CAT scan.  After admission     I allowed her some time to rest and time for her medications to wear off     and she began to be more easily aroused and could attend and converse.     She became oriented to person, place, time, and situation and conversed     clearly.  Neurologic exam did not reveal any focal deficits.  No CT scan     of the head was ever done, nor was it indicated after admission.  She was     gradually restarted on  her psychotropic medications.  It was thought that     her mental status changes were likely due to a combination of factors     including acute infection, dehydration, and polypharmacy.   1. ELEVATED TROPONIN AND ABNORMAL EKG:  Due to Ms. Selley's altered mental     status on presentation and her history of severe coronary artery disease,     the possibility of acute coronary syndrome was considered.  Cardiac     enzymes revealed an initial troponin of 0.15 with normal total CPK and CK-     MB.  EKG showed Q-waves and T-wave inversions in V1 through V3 which were     not seen on a previous EKG from 2003 in November.  I consulted her     cardiologist and they came and evaluated her and a follow-up troponin was     normal.  Repeat EKG again confirmed the previously mentioned Q-waves and     T-wave inversions.  No acute ST changes were seen and it was felt that     this was very unlikely to be acute coronary syndrome contributing to her     illness.  They felt that the mildly elevated troponin could be attributed     to her pulmonary process.  An echocardiogram was done and the results are     as in the procedures section.  She will continue outpatient cardiology     follow-up with Dr. Dorethea Clan and Dr. Dietrich Pates.   LABORATORY DATA:  Blood culture:  No growth.  Cath urinalysis:  Moderate  hemoglobin, 100 protein, otherwise negative.  Urine drug screen positive for  benzodiazepines and opiates.  TSH 1.85.  Acetaminophen, salicylate, and  alcohol levels all negative.  Discharge basic metabolic panel showed a  sodium 138, potassium 4.1, chloride 102, bicarb 28, glucose 131, BUN 6,  creatinine 0.6, calcium 8.6.  CBC on admission:  White blood cells 7,  hemoglobin 12.2, platelets 160, differential 64% neutrophils, 22%  lymphocytes.  On May 20 ,2005, AST 112, ALT 80, total bilirubin 0.2,  alkaline phosphatase 68.   DISPOSITION:  The patient was discharged home in significantly improved condition  on the previously mentioned discharge medications.  She was  instructed to gradually increase her activity as tolerated and to resume a  diabetic diet.   FOLLOW UP:  She was instructed to follow up with [facility] in about two weeks.  She was advised to continue to  abstain from smoking.    ___________________________________________                                         Jeoffrey Massed, M.D.   PHM/MEDQ  D:  09/24/2003  T:  09/25/2003  Job:  [id number]   cc:   Vida Roller, M.D.  Fax: [phone]

## 2010-09-15 NOTE — H&P (Signed)
[name], [name]             ACCOUNT NO.:  [id number]   MEDICAL RECORD NO.:  [id number]          PATIENT TYPE:  INP   LOCATION:  2316                         FACILITY:  [facility]   PHYSICIAN:  Shan Levans, M.D. LHCDATE OF BIRTH:  Mar 08, 1946   DATE OF ADMISSION:  09/11/2005  DATE OF DISCHARGE:                                HISTORY & PHYSICAL   CHIEF COMPLAINT:  Abdominal pain.   A 65 year old  for abdominal pains.  She presented to the emergency room at  1407 with nausea, vomiting, abdominal got progressively worse.  She has not  had diarrhea but rather has had obstipation, intermittent, beginning  progressive left flank __________  area.  It was aching, , cramping and  sharp.  The pain has been difficult to control.  __________  service at  [facility] but after several hours of observation, it became apparent she  required a higher level of care and she is transferred to [facility] for further consultation.  She has had anorexia, nausea and  vomiting, has also had three episodes of diarrhea earlier but now is  obstipated.  Severe fever initial and then because of the continued  abdominal pain, CT scan showed evidence of colitis, likely inflammatory.   REVIEW OF SYSTEMS:  No history of headaches, dizziness, no difficulty  swallowing, no history of frequency, urgency, dysuria, no weight loss, no  abdominal pain in the past.   PAST MEDICAL HISTORY:   MEDICAL HISTORY:  1.  Coronary artery disease with stent placement in the past and bypass      surgery five years ago.  2.  History of type 2 diabetes.  3.  Hypertension.  4.  Anxiety.  5.  Depression.  6.  Carotid endarterectomy.  7.  Carpal tunnel release.  8.  Cervical laminectomy.  9.  Lumbar spine surgery.  10. History of hypothyroidism.   MEDICATIONS:  1.  Lipitor 10 mg daily.  2.  Lisinopril 10 mg daily.  3.  Actos 30 mg daily.  4.  Lasix 40 mg daily.  5.  Amitriptyline 100 mg daily.  6.  __________   q.6h. p.r.n. anxiety.  7.  Metoprolol 25 mg daily.  8.  Fluvoxamine 100 mg daily.   ALLERGIES:  None.   SOCIAL HISTORY:  The patient is married.  She still smokes a pack a day.  Drinks alcohol occasionally.   FAMILY HISTORY:  Positive for hypertension, diabetes.   PHYSICAL EXAMINATION:  GENERAL:  This is an ill-appearing white female in  moderate abdominal pain.  VITAL SIGNS:  Temp was 98, respirations 20, blood pressure 86/43, saturation  96% on 2 liters.  LUNGS:  Noted to be clear without evidence of wheeze or rhonchi.  CARDIAC:  Showed a regular rate and rhythm, tachycardic, without S3.  EXTREMITIES:  Cool.  Poorly perfused.  NEUROLOGIC:  Awake and alert, moves all four extremities.  SKIN:  Clear.  HEENT:  Oropharynx shows dry mucous membranes.  No jugular venous  distention.  NECK:  Supple.  __________ .  No masses are detected.   LABORATORY DATA:  Hemoglobin initially was 12.9, now it is 10.0.  White  count 10,000 initially, now 8.6.  Platelet count was initially 174, now down  to 125,000.  Sodium 133, potassium 5.0, chloride 107, CO2 22, BUN 32,  creatinine 1.8.  Urinalysis was essentially negative.  Lactic acid level was  1.5.  Cortisol level was pending.  BNP level was __________  .  Cardiac  enzymes were negative.  Chest x-ray showed no active disease process except  for basilar subsegmental atelectasis.  CT scan of the abdomen was already  obtained.  It was reviewed.  It revealed left colonic thickening but no free  air, changes compatible with colitis either ischemic or infectious.   IMPRESSION:  Nausea, vomiting, dehydration, likely acute gastroenteritis and  now associated left colitis either ischemic from hypoperfusion or infectious  in etiology with associated septic shock and severe sepsis, associated   Dictation ended at this point.      Shan Levans, M.D. [facility]  Electronically Signed     PW/MEDQ  D:  09/11/2005  T:  09/11/2005  Job:  [id number]   cc:    Francoise Schaumann. Raynelle Highland  Fax: [phone]   [place] Daphine Deutscher, MD  [address]  [place]  [place] [address]   patient's chart   In [employer]

## 2010-09-15 NOTE — Consult Note (Signed)
[name], [name]             ACCOUNT NO.:  [id number]   MEDICAL RECORD NO.:  [id number]          PATIENT TYPE:  INP   LOCATION:  6733                         FACILITY:  [facility]   PHYSICIAN:  Di Kindle. Edilia Bo, M.D.DATE OF BIRTH:  1945-09-28   DATE OF CONSULTATION:  09/14/2005  DATE OF DISCHARGE:                                   CONSULTATION   HISTORY:  This is a 65 year old woman who had been admitted by Dr. Danise Mina with nausea, vomiting, dehydration, and left colitis.  She was  evaluated by Dr. Leone Payor and was it felt that she had a left sided colitis  based on her CT findings.  She had evidence of significant vascular disease  on her CT also and vascular surgery was consulted for further  recommendations.  The plan with respect to her colitis was continued  antibiotics, hydration, and pain control, and her symptoms have been  improving.  Of note, she notes prior to these episodes that she did not have  any clear cut postprandial abdominal pain.  She has also not lost any  significant weight and, in fact, she is fairly obese.  She has had some left  sided abdominal pain which has been gradually improving.  She tells me that  she does not need eat a lot just because she has a poor appetite.   From a vascular standpoint, she also admits to some bilateral calf and thigh  claudication that she has had for years.  She can walk approximately one  block before experiencing symptoms. She has had no significant rest pain and  no history of nonhealing wounds.   PAST MEDICAL HISTORY:  1.  Her past medical history significant for coronary disease and she has      undergone previous coronary artery bypass surgery as well as previous      stenting.  2.  Type 2 diabetes.  3.  Hypertension.  4.  Obesity.  5.  Hypercholesterolemia.  6.  History of depression.  7.  History of myocardial infarction in 1990.  She says her cardiologist is      Dr. Dorethea Clan.  8.  History of congestive  heart failure which she says she was admitted for      approximately a year ago.  9.  In addition, she had a previous right carotid endarterectomy in [facility].   SOCIAL HISTORY:  She is married.  She has four children.  She smokes a pack  per day of cigarettes and has been smoking for approximately 45 years.   FAMILY HISTORY:  History of hypertension and diabetes.  She is unaware of  any history of premature cardiovascular disease.   REVIEW OF SYSTEMS:  She had no recent chest pain or chest pressure.  She has  had no history of stroke, TIAs, expressive or receptive aphasia, or  amaurosis fugax.  She has had no history of DVT or phlebitis that she is  aware of.  The remainder of the review of systems, medical history, and  medications are documented on her admission history and physical.  PHYSICAL EXAMINATION:  VITAL SIGNS:  Temperature is 98.1, blood pressure 125/69, heart rate is 61.  HEENT:  She does have a right carotid bruit.  There is no cervical  lymphadenopathy.  LUNGS:  Clear bilaterally to auscultation.  CARDIAC EXAM:  She has a regular rate and rhythm.  ABDOMEN:  Obese and difficult to assess. I cannot palpate an aneurysm,  however this would be impossible to feel given her size.  EXTREMITIES:  She has palpable femoral pulses with a slightly diminished  left femoral pulse.  She has a right dorsalis pedis pulse and a left  posterior tibial pulse which are palpable.  Both feet appear adequately  perfused without ischemic ulcers.  She has mild bilateral lower extremity  swelling.  She has no evidence of significant chronic venous insufficiency.   I reviewed his CT scan which shows that she does appear to have some disease  in the proximal celiac although the SMA appears patent and fills the middle  colic artery.  The IMA is occluded at its origin.  She does have bilateral  renal artery stenoses and also some calcific disease in the infrarenal aorta  and bilateral  common iliac arteries.  It is difficult to assess the severity  of the stenosis in the celiac and also the iliac arteries based on the CT  scan.   IMPRESSION:  Based on the findings on CT, it appears that the SMA is patent  and does supply the middle colic artery.  It is noted that branch vessels of  the middle colic artery beginning at the splenic flexure and towards the  sigmoid, left colic, and middle colic artery branches are fairly small.  I  would agree that addressing the celiac stenosis would not significantly  improve perfusion to the sigmoid colon. The other consideration would be if  she had significant iliac disease which might compromise flow to the  hypogastric arteries that this could affect circulation of the sigmoid  colon. Although she has fairly normal femoral pulses, so I do not think the  calcific disease of her iliacs is causing a significant issue here.  In  short, I do not think that there are any surgical or endovascular options  which would significantly impact on the circulation to her sigmoid colon.  Her symptoms appear to be improving and I would agree with continued that  conservative treatment.   With respect to her right carotid bruits, it has been awhile since she has  had a carotid duplex scan and I will order one while she is here in the  hospital. She had a previous right carotid endarterectomy in [facility]  in the remote past, she is not sure of the date.  Given that she had some  problems passing out in the past think, I think it would be reasonable to  check on this.  In addition, we will obtain some baseline ABIs.  I will be  happy to see her back in the future if her symptoms progress, but again, I  do not really see any real options surgically or from an endovascular  standpoint which would significantly impact her circulation to the colon.      Di Kindle. Edilia Bo, M.D.  Electronically Signed    CSD/MEDQ  D:  09/14/2005  T:   09/15/2005  Job:  [id number]

## 2010-09-15 NOTE — Consult Note (Signed)
[name], [name]             ACCOUNT NO.:  [id number]   MEDICAL RECORD NO.:  [id number]          PATIENT TYPE:  INP   LOCATION:  IC09                          FACILITY:  [facility]   PHYSICIAN:  Vida Roller, M.D.   DATE OF BIRTH:  09/09/1945   DATE OF CONSULTATION:  09/11/2005  DATE OF DISCHARGE:  09/11/2005                                   CONSULTATION   REFERRING PHYSICIAN:  Renato Battles, M.D. at the [facility]  A Team.   PRIMARY CARE PHYSICIAN:  Dr. Stephania Fragmin in [place], [place].   REFERRING SURGEON:  Dr. Dalia Heading.   HISTORY OF PRESENT ILLNESS:  [name] is a 65 year old woman who I  followed who has severe coronary artery disease status post bypass surgery  about 10 to 12 years ago with essentially her only significant anastomosis  that is still functioning is her LIMA to her LAD.  She presented to [facility] Emergency Department complaining of about 15 hours of abdominal pain,  left-sided, associated with nausea and vomiting.  She was found to be  febrile in the ER, moderately hypotensive with minimal response to IV  fluids.  The surgeon saw her at the request of the gastroenterologist and  the primary doctors and the surgeons asked if we could see the patient to  make recommendations for the potential for perioperative risks if this  patient required laparotomy.  She is not complaining of any chest  discomfort.  She has moderate shortness of breath but her major complaint is  her abdominal pain, bloating and distention.   PAST MEDICAL HISTORY:  1.  Significant for severe coronary artery disease.  Catheterization in 2004      showed severe three vessel native coronary disease, a patent LIMA to her      LAD, saphenous vein graft to the right coronary artery was occluded.      Her ejection fraction at that time was 50%.  She has had an      echocardiogram in 2006 which showed no significant valvular heart      disease in the normal  perfusion study.  2.  She has hypertension.  3.  Hyperlipidemia.  4.  Diabetes mellitus.  5.  Known peripheral vascular disease, status post left carotid      endarterectomy a number of years ago.  6.  Ongoing tobacco abuse.  7.  Well established COPD.   CURRENT MEDICATIONS:  1.  Amitriptyline 100 mg once a day.  2.  Ciprofloxacin 400 mg IV q.12h.  3.  Clopidogrel 75 mg once a day.  4.  Insulin.  5.  Flagyl 500 mg IV q.8h.  6.  Protonix 40 mg p.o. daily.  7.  Simvastatin 20 mg once a day.  8.  Sodium chloride infusion.  9.  A variety of p.r.n. medications.  10. She has received several boluses of normal saline.   ALLERGIES:  She has no known drug allergies.   PAST SURGICAL HISTORY:  1.  She has had a carpal tunnel surgery.  2.  Cervical laminectomy.  3.  Lumbar laminectomy.  4.  She has had the carotid endarterectomy.   CURRENT MEDICATIONS:  1.  Lipitor 10 mg once a day.  2.  Plavix 75 once a day.  3.  Lisinopril 10 mg once a day.  4.  Actos 30 mg once a day.  5.  Lasix 40 mg once a day.  6.  Amitriptyline 100 mg once a day.  7.  Alprazolam 1 mg q.6h. p.r.n. anxiety.  8.  Metoprolol 25 mg a day.  9.  She takes fluvoxamine 100 mg once a day.   SOCIAL HISTORY:  She is married.  Her husband lives with her in [place].  She smokes about a pack a day and has for many years.  Has a 75 to 80-pack-  year smoking history.  She will occasionally use alcohol but not to excess.  Denies any illicit drugs.   FAMILY HISTORY:  Both parents have premature coronary artery disease and had  myocardial infarction in the past.   REVIEW OF SYSTEMS:  She denies any chest pain or shortness of breath.  No  PND or orthopnea.  She does have decreased urinary symptoms. She does have  myalgias, abdominal pain, nausea, vomiting.  No bloody diarrhea.  No melena  and no hematochezia and the remainder of her review of systems is negative.   PHYSICAL EXAMINATION:  She is an ill-appearing white  female lying in bed.  Heart rate is 73, respirations are 20.  Her blood pressure is 86/43  saturating 96% on room air.  Her temperature is 99.2, temperature max of  100.2.  examination of the head, ears, eyes, nose and throat is  unremarkable. The neck is supple.  There is no obvious jugular venous  distention.  She does have soft bilateral carotid bruits.  Cardiovascular  exam is distant but regular.  No obvious murmurs are noted.  Her lungs have  coarse breath sounds at the bases.  There are no rales.  She has reasonably  good air movement.  Her abdomen is diffusely tender, most significant in the  left lower quadrant.  She has diminished bowel sounds but there are some  present.  She has significant tenderness to palpation.  No obvious rebound.  GU, breasts, and rectal exam were deferred.  Did not appreciate any  hepatosplenomegaly.  Lower extremities are without significant edema.  Pulses are 1+.   LABORATORY DATA:  White blood cell count is 9.2, H&H 10 and 29, platelet  count of 129,000 (down from 174,000).  There is a left shift to her  differential.  Sodium 133, potassium 5.0, chloride 107, bicarb 22 (down from  25 on admission), glucose 97, BUN is 32, creatinine is 1.9.  Single set of  point of care enzymes upon admission were negative.  I do not have a chest x-  ray or an electrocardiogram to review; however, we do have a CT scan which  shows a thick-walled colon and upper sigmoid consistent with ischemic  colitis.   IMPRESSION:  1.  This is a lady with acute abdominal pain.  Please see the GI and      surgical notes for further differential but she does appear to have left-      sided colitis which is very concerning for ischemic colitis.  Given her      multiple other medical problems, it is very likely that this is indeed      ischemic colitis, especially with her ongoing tobacco abuse.  She has  multiple risk factors for diffuse peripheral vascular disease.  2.   Hypotension which is very concerning.  She has gotten three liters of      fluid over the last 24 hours without significant improvement in her      blood pressure which is now about 90/50.  Her abdomen is significantly      distended.  I do not see any ascites but I would be very concern for      early sepsis in this lady with multiple issues.  3.  Coronary artery disease, severe:  She had a heart catheterization a few      years ago, is not a surgical candidate for revascularization due to her      severe chronic obstructive pulmonary disease, does not have any targets      amenable for revascularization.  Ejection fraction fortunately is      preserved the last time we looked and this will need to be repeated.  4.  Hypertension.  5.  Diabetes.  6.  Hyperlipidemia.  7.  Peripheral vascular disease.  8.  Ongoing tobacco abuse.  9.  Chronic obstructive pulmonary disease.   RECOMMENDATIONS:  She needs to go to the ICU.  She needs a stat ECG and stat  portable chest x-ray.  We should probably check a B-type natriuretic  peptide.  I would recommend blood cultures.  I would also recommend critical  care evaluation via E-link and serious consideration should be made to  transfer this lady to [facility] for further evaluation.      Vida Roller, M.D.  Electronically Signed     JH/MEDQ  D:  09/11/2005  T:  09/11/2005  Job:  [id number]

## 2010-09-15 NOTE — Procedures (Signed)
[name], [name]             ACCOUNT NO.:  [id number]   MEDICAL RECORD NO.:  [id number]          PATIENT TYPE:  REC   LOCATION:  RAD                           FACILITY:  [facility]   PHYSICIAN:  E. Graceann Congress, MD, FACCDATE OF BIRTH:  1945/08/27   DATE OF PROCEDURE:  DATE OF DISCHARGE:  11/19/2005                                  STRESS TEST   The patient is a 65 year old post coronary artery bypass grafting.  She  exercised through 12 seconds stage II of the Bruce protocol.  She was  only minimally fatigued.  There was poor EKG transmission.  We made the  decision because of that to switch her to adenosine.  She was given 58  mg of adenosine IV.  The patient tolerated this well.  She did note some  headache and flushing. The patient then to be transferred to nuclear for  the Myoview.      Cecil Cranker, MD, [facility]  Electronically Signed     EJL/MEDQ  D:  04/03/2006  T:  04/03/2006  Job:  [id number]

## 2010-09-15 NOTE — Procedures (Signed)
   NAME:  [name], [name]                       ACCOUNT NO.:  [id number]   MEDICAL RECORD NO.:  [id number]                   PATIENT TYPE:  PREC   LOCATION:                                       FACILITY:   PHYSICIAN:  Jae Dire, P.A. [facility]               DATE OF BIRTH:  03-Jun-1945   DATE OF PROCEDURE:  07/20/2002  DATE OF DISCHARGE:                                    STRESS TEST   INDICATIONS:  The patient is a 65 year old female with known coronary artery  disease status post coronary artery bypass grafting.  She recently underwent  cardiac catheterization on July 15, 2002.  The study showed multiple  blockages including 80% proximal LAD, 90% mid LAD, stent in the circumflex  with 30% instent restenosis and 90% focal restenosis at the AV groove.  RCA  was with diffuse disease and 80% focal mid stenosis.  Left internal mammary  artery to left anterior descending graft was patent.  Saphenous vein graft  to diagonal was patent.  Saphenous vein graft to RCA was occluded.  There  were multiple areas that could be causing potential ischemia.  This  Cardiolite was ordered to correlate with catheterization results and guide  ischemic therapy.   BASELINE DATA:  EKG shows sinus rhythm at 59 beats per minute with  nonspecific ST abnormalities.  Blood pressure is 140/88.   The patient exercised for a total of five minutes to 7.0 METS.   STRESS DATA:  Maximum heart rate 138 beats per minute (85% of predicted).  Maximum blood pressure 178/88.   The patient complained of chest pressure, shortness of breath, and  lightheadedness.  These symptoms all resolved in recovery.   EKG showed no arrhythmias.  0.5-1 mm of ST depression was noted  inferolaterally.  Final images and results are pending M.D. review.                                               Jae Dire, P.A. [facility]    AB/MEDQ  D:  07/20/2002  T:  07/20/2002  Job:  [id number]

## 2010-09-15 NOTE — Letter (Signed)
April 09, 2006    [name], DO  [address]  [place], [place] [address]   RE:  [name], [name]  MRN:  [id number]  /  DOB:  Aug 19, 1945   Dear [name]:   [name] returns to the office for continued assessment and treatment  of coronary disease and chest discomfort.  Since her last visit, she has  felt fairly well.  She has had no recurrent chest pain.  She underwent a  pharmacologic stress nuclear study, which was essentially normal.  There  was mild breast attenuation artifact.   She was able to discontinue cigarette smoking earlier this year for 3  months with the assistance of Chantix.   Medications are unchanged from her last visit.   EXAM:  A pleasant woman appearing more animated than at her last visit.  The weight is unchanged.  The blood pressure is 110/60, heart rate 65  and regular.  CARDIAC:  Fourth heart sound present.  LUNGS:  Minor inspiratory and expiratory rhonchi.   IMPRESSION:  [name] does not appear to have any significant stress-  induced ischemia.  She will continue her current medications plus  Chantix in attempt to discontinue tobacco use once again.  I will plan  to see this nice woman again in 6 months.    Sincerely,      Gerrit Friends. Dietrich Pates, MD, [facility]  Electronically Signed    RMR/MedQ  DD: 04/09/2006  DT: 04/09/2006  Job #: [id number]
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
## 2010-09-15 NOTE — Letter (Signed)
March 28, 2006    Francoise Schaumann. Milford Cage, MD  [address]  [place], [place] [address]   RE:  MAYZEE, REICHENBACH  MRN:  [id number]  /  DOB:  November 27, 1945   Dear Brett Canales:   Ms. Zetino returns to the office after an evaluation by Dr. Diona Browner  just a week or two ago. She apparently requested that she continue to be  seen in this office. Since she saw him, she has had one episode of chest  discomfort. She describes these as lancinating pain occurring in the mid  chest, associated with nausea, diaphoresis and dyspnea and radiating to  the left neck and ear. These were unrelated to exertion. There is  apparent relief with sublingual nitroglycerin.   On exam, chronically ill and depressed-appearing woman in no acute  distress. The weight is 228, six pounds more than earlier this month.  Blood pressure 115/70, heart rate 50 and regular, respirations 16.  NECK: No jugular venous distention; normal carotid upstrokes without  bruits.  LUNGS:  Minor inspiratory and expiratory rhonchi; prolonged expiratory  phase.  CARDIAC: Normal 1st and 2nd heart sounds; 4th heart sound present.  ABDOMEN: Soft and nontender; no masses; aortic pulsation not palpable;  no organomegaly.  EXTREMITIES: 1/2+ edema; distal pulses intact.   EKG: Sinus bradycardia; delayed R-wave progression-cannot exclude prior  septal myocardial infarction; occasional PVC.   Impression: Ms. Cordner has symptoms that are somewhat worrisome for  progression of her coronary disease. She has not had any studies within  the past 3 years. A stress nuclear examination will be performed. This  will likely need to be done with pharmacologic stress. We will hold beta-  blocker prior to her study. Amlodipine will be substituted for  isosorbide mononitrate as it is more likely to be an effective anti-  anginal agent.   Ms. Romo has been facing the issue of tobacco for some time. She has  tried and successfully quit for brief  periods of time on numerous  occasions. She needs to pursue this once again. I will reassess Ms.  Hartfield once her stress test has been completed.    Sincerely,      Gerrit Friends. Dietrich Pates, MD, [facility]  Electronically Signed    RMR/MedQ  DD: 03/28/2006  DT: 03/28/2006  Job #: [id number]
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
## 2010-09-15 NOTE — H&P (Signed)
[name], [name]             ACCOUNT NO.:  [id number]   MEDICAL RECORD NO.:  [id number]          PATIENT TYPE:  INP   LOCATION:  2316                         FACILITY:  [facility]   PHYSICIAN:  Shan Levans, M.D. LHCDATE OF BIRTH:  08/30/1945   DATE OF ADMISSION:  09/11/2005  DATE OF DISCHARGE:                                HISTORY & PHYSICAL   IMPRESSION:  1.  Acute gastroenteritis leading to volume depletion leading to colitis,      likely ischemic in nature with severe sepsis and septic shock.  2.  Type 2 diabetes.  3.  Coronary artery disease with bypass surgery.  4.  Obesity.  5.  Smoking.  6.  Hypertension .  7.  Peripheral vascular disease with carotid endarterectomy.  8.  Hyperlipidemia.  9.  Anxiety.   RECOMMENDATIONS:  1.  We will place on septic shock protocol with fluid resuscitation strategy      utilizing saline and Levophed.  2.  Place a central line to monitor CVP.  3.  Obtain general surgery consultation.  4.  No Xigris as of yet.  5.  Administer Cipro and Flagyl.      Shan Levans, M.D. [facility]  Electronically Signed     PW/MEDQ  D:  09/11/2005  T:  09/11/2005  Job:  [id number]

## 2010-09-21 ENCOUNTER — Ambulatory Visit (INDEPENDENT_AMBULATORY_CARE_PROVIDER_SITE_OTHER): Payer: PRIVATE HEALTH INSURANCE | Admitting: Cardiology

## 2010-09-21 ENCOUNTER — Encounter: Payer: Self-pay | Admitting: Cardiology

## 2010-09-21 VITALS — BP 124/73 | HR 63 | Ht 64.0 in | Wt 262.0 lb

## 2010-09-21 DIAGNOSIS — I951 Orthostatic hypotension: Secondary | ICD-10-CM

## 2010-09-21 DIAGNOSIS — E785 Hyperlipidemia, unspecified: Secondary | ICD-10-CM

## 2010-09-21 DIAGNOSIS — I495 Sick sinus syndrome: Secondary | ICD-10-CM

## 2010-09-21 DIAGNOSIS — I10 Essential (primary) hypertension: Secondary | ICD-10-CM

## 2010-09-21 DIAGNOSIS — I2510 Atherosclerotic heart disease of native coronary artery without angina pectoris: Secondary | ICD-10-CM

## 2010-09-21 NOTE — Assessment & Plan Note (Signed)
Symptomatically stable on present medical therapy. Encouraged regular exercise, diet, smoking cessation.

## 2010-09-21 NOTE — Assessment & Plan Note (Signed)
Blood pressure remains well-controlled. No changes made to regimen.

## 2010-09-21 NOTE — Patient Instructions (Signed)
**Note De-identified  Obfuscation** Your physician recommends that you continue on your current medications as directed. Please refer to the Current Medication list given to you today.  Your physician recommends that you schedule a follow-up appointment in: 4 months  

## 2010-09-21 NOTE — Assessment & Plan Note (Signed)
Continue followup with Dr. Milford Cage. Aim for LDL control around 70.

## 2010-09-21 NOTE — Progress Notes (Signed)
Clinical Summary [name] is a 65 y.o.female presenting for routine followup. Fortunately, she reports no recurrent angina, stable dyspnea on exertion. She reports compliance with her medications.  She still has not been able to completely quit smoking. Reports one half pack per day at this point. We addressed smoking cessation again today.  She reports followup lab work with [name] in terms of lipid status.  She continues to prefer observation on medical therapy at this point. We are holding off proceeding to invasive cardiac testing unless her symptoms progress.   No Known Allergies  Current outpatient prescriptions:alprazolam (XANAX) 2 MG tablet, Take 2 mg by mouth at bedtime as needed.  , Disp: , Rfl: ;  amitriptyline (ELAVIL) 100 MG tablet, Take 100 mg by mouth 2 (two) times daily.  , Disp: , Rfl: ;  amLODipine (NORVASC) 5 MG tablet, Take 5 mg by mouth daily.  , Disp: , Rfl: ;  aspirin 81 MG tablet, Take 81 mg by mouth daily.  , Disp: , Rfl: ;  clopidogrel (PLAVIX) 75 MG tablet, Take 75 mg by mouth daily.  , Disp: , Rfl:  furosemide (LASIX) 40 MG tablet, Take 40 mg by mouth as needed.  , Disp: , Rfl: ;  glyBURIDE (DIABETA) 5 MG tablet, Take 5 mg by mouth 2 (two) times daily. 2 tabs bid  , Disp: , Rfl: ;  HYDROcodone-acetaminophen (LORTAB) 7.5-500 MG per tablet, Take 1 tablet by mouth every 6 (six) hours as needed.  , Disp: , Rfl: ;  isosorbide mononitrate (IMDUR) 60 MG 24 hr tablet, Take 60 mg by mouth daily.  , Disp: , Rfl:  lactulose (CEPHULAC) 10 G packet, Take 10 g by mouth 3 (three) times daily.  , Disp: , Rfl: ;  metFORMIN (GLUCOPHAGE) 500 MG tablet, Take 500 mg by mouth 2 (two) times daily with a meal.  , Disp: , Rfl: ;  metoprolol (LOPRESSOR) 50 MG tablet, Take 50 mg by mouth 2 (two) times daily.  , Disp: , Rfl: ;  nitroGLYCERIN (NITROSTAT) 0.4 MG SL tablet, Place 0.4 mg under the tongue every 5 (five) minutes as needed.  , Disp: , Rfl:  polyethylene glycol powder (GLYCOLAX/MIRALAX)  powder, Take 17 g by mouth daily.  , Disp: , Rfl: ;  potassium chloride (KLOR-CON) 10 MEQ CR tablet, Take 10 mEq by mouth daily.  , Disp: , Rfl: ;  pravastatin (PRAVACHOL) 80 MG tablet, Take 80 mg by mouth daily.  , Disp: , Rfl: ;  Simethicone (GAS-X EXTRA STRENGTH PO), Take by mouth.  , Disp: , Rfl:   Past Medical History  Diagnosis Date  . Mitral regurgitation     mild to moderate  . Diabetes mellitus   . Obesity   . PAD (peripheral artery disease)   . Acute ischemic colitis   . CAD (coronary artery disease)     multi vessel LVEF 50-50%,DES circ 1/11,occluded svg to om and svg to diagonal ,LVEF 60%  . Hyperlipidemia   . Hypertension   . Orthostatic hypotension     Social History [name] reports that she has been smoking.  She has never used smokeless tobacco. [name] reports that she does not drink alcohol.  Review of Systems Otherwise reviewed and negative.   Physical Examination Filed Vitals:   09/21/10 1312  BP: 124/73  Pulse: 63  Additional Exam: Morbidly obese chronically ill-appearing woman in no acute distress.  HEENT: Conjunctiva and lids are grossly normal with ecchymosis on the right, oropharynx  with poor dentition.  Neck: Supple, no obvious elevation in JVP, no bruits.  Lungs: Diminished breath sounds without wheezing or labored breathing.  Cardiac: Regular rate and rhythm, no S3 or loud systolic murmur.  Abdomen: Soft, nontender, bowel sounds present  Skin: Warm and dry.  Extremities: No pitting edema, distal pulses one plus.  Musculoskeletal: No kyphosis.  Neuropsychiatric: Alert and oriented x3, affect grossly appropriate.    ECG Normal sinus rhythm with evidence of old anterior infarct, nonspecific T wave changes.  Studies   Problem List and Plan

## 2010-10-03 ENCOUNTER — Encounter: Payer: Self-pay | Admitting: Cardiology

## 2010-12-20 ENCOUNTER — Other Ambulatory Visit: Payer: Self-pay

## 2010-12-20 MED ORDER — ISOSORBIDE MONONITRATE ER 60 MG PO TB24: 60.0000 mg | ORAL_TABLET | Freq: Every day | ORAL | Status: DC

## 2010-12-20 NOTE — Telephone Encounter (Signed)
..   Requested Prescriptions   Pending Prescriptions Disp Refills  . ISOSORBIDE MONONITRATE CR 60 MG PO TB24 30 tablet 5    Sig: Take 1 tablet (60 mg total) by mouth daily.   Pt has a f/u OV in 4 months for recall appointment.

## 2011-01-23 ENCOUNTER — Encounter: Payer: Self-pay | Admitting: Cardiology

## 2011-01-25 ENCOUNTER — Encounter: Payer: Self-pay | Admitting: Cardiology

## 2011-01-25 ENCOUNTER — Ambulatory Visit (INDEPENDENT_AMBULATORY_CARE_PROVIDER_SITE_OTHER): Payer: PRIVATE HEALTH INSURANCE | Admitting: Cardiology

## 2011-01-25 VITALS — BP 117/63 | HR 59 | Resp 18 | Ht 65.0 in | Wt 261.1 lb

## 2011-01-25 DIAGNOSIS — I2510 Atherosclerotic heart disease of native coronary artery without angina pectoris: Secondary | ICD-10-CM

## 2011-01-25 DIAGNOSIS — I10 Essential (primary) hypertension: Secondary | ICD-10-CM

## 2011-01-25 DIAGNOSIS — F17200 Nicotine dependence, unspecified, uncomplicated: Secondary | ICD-10-CM

## 2011-01-25 DIAGNOSIS — E785 Hyperlipidemia, unspecified: Secondary | ICD-10-CM

## 2011-01-25 NOTE — Assessment & Plan Note (Signed)
Symptomatically stable on medical therapy, no progressive angina or shortness of breath. Plan to continue observation for now. We have discussed warning signs and symptoms that might prompt invasive evaluation.

## 2011-01-25 NOTE — Assessment & Plan Note (Signed)
Followed by Dr. Milford Cage.

## 2011-01-25 NOTE — Assessment & Plan Note (Signed)
We continue to discuss smoking cessation. 

## 2011-01-25 NOTE — Progress Notes (Signed)
Clinical Summary [name] is a 65 y.o.female presenting for followup. She was seen in May.  Fortunately, she remains stable without progressive angina symptoms. She reports compliance with her medications.  She continues to smoke cigarettes, and we again discussed smoking cessation strategies. She has had a very difficult time in trying to quit.  She reports followup with Dr. Milford Cage for lab work and lipid management.   No Known Allergies  Medication list reviewed.  Past Medical History  Diagnosis Date  . Mitral regurgitation     Mild to moderate  . Type 2 diabetes mellitus   . Obesity   . PAD (peripheral artery disease)   . Acute ischemic colitis   . Coronary atherosclerosis of native coronary artery     Mulitvessel LVEF 50-50%, DES circ 1/11, occluded SVG to OM and SVG to diagonal , LVEF 60%  . Mixed hyperlipidemia   . Essential hypertension, benign   . Orthostatic hypotension     Past Surgical History  Procedure Date  . Neck surgery   . Carpal tunnel release   . Partial hysterectomy   . Back surgery   . Coronary artery bypass graft     LIMA-LAD; SVG-OM; SVG-DX in 1995 [facility]  . Carotid endarectomy     Family History  Problem Relation Age of Onset  . Coronary artery disease    . Hypertension      Social History Ms. Balboni reports that she has been smoking Cigarettes.  She has never used smokeless tobacco. Ms. Court reports that she does not drink alcohol.  Review of Systems No palpitations or syncope. Complains of intermittent lower extremity edema, right greater than left. Otherwise negative.  Physical Examination Filed Vitals:   01/25/11 1309  BP: 117/63  Pulse: 59  Resp: 18   Morbidly obese chronically ill-appearing woman in no acute distress.  HEENT: Conjunctiva and lids are grossly normal with ecchymosis on the right, oropharynx with poor dentition.  Neck: Supple, no obvious elevation in JVP, no bruits.  Lungs: Diminished breath sounds without  wheezing or labored breathing.  Cardiac: Regular rate and rhythm, no S3 or loud systolic murmur.  Abdomen: Soft, nontender, bowel sounds present  Skin: Warm and dry.  Extremities: No pitting edema, distal pulses one plus.  Musculoskeletal: No kyphosis.  Neuropsychiatric: Alert and oriented x3, affect grossly appropriate.    Problem List and Plan

## 2011-01-25 NOTE — Assessment & Plan Note (Signed)
Blood pressure is well-controlled today. 

## 2011-01-25 NOTE — Patient Instructions (Signed)
**Note De-identified  Obfuscation** Your physician recommends that you continue on your current medications as directed. Please refer to the Current Medication list given to you today.  Your physician recommends that you schedule a follow-up appointment in: 6 months  

## 2011-02-12 LAB — DIFFERENTIAL
Basophils Absolute: 0
Basophils Relative: 1
Eosinophils Absolute: 0
Eosinophils Relative: 1
Lymphocytes Relative: 34
Lymphs Abs: 1.5
Monocytes Absolute: 0.5
Monocytes Relative: 12 — ABNORMAL HIGH
Neutro Abs: 2.5
Neutrophils Relative %: 54

## 2011-02-12 LAB — BASIC METABOLIC PANEL
BUN: 13
CO2: 25
Calcium: 9
Chloride: 106
Creatinine, Ser: 0.83
GFR calc Af Amer: >60
GFR calc non Af Amer: >60
Glucose, Bld: 117 — ABNORMAL HIGH
Potassium: 3.4 — ABNORMAL LOW
Sodium: 140

## 2011-02-12 LAB — POCT CARDIAC MARKERS
CKMB, poc: <1 — ABNORMAL LOW
Operator id: 189501
Troponin i, poc: <0.05

## 2011-02-12 LAB — CBC
Platelets: 168
RDW: 13.6

## 2011-02-16 ENCOUNTER — Other Ambulatory Visit: Payer: Self-pay | Admitting: *Deleted

## 2011-02-16 MED ORDER — NITROGLYCERIN 0.4 MG SL SUBL: 0.4000 mg | SUBLINGUAL_TABLET | SUBLINGUAL | Status: DC | PRN

## 2011-05-24 ENCOUNTER — Ambulatory Visit (INDEPENDENT_AMBULATORY_CARE_PROVIDER_SITE_OTHER): Payer: PRIVATE HEALTH INSURANCE | Admitting: Adult Health

## 2011-05-24 ENCOUNTER — Encounter: Payer: Self-pay | Admitting: Adult Health

## 2011-05-24 DIAGNOSIS — I2510 Atherosclerotic heart disease of native coronary artery without angina pectoris: Secondary | ICD-10-CM

## 2011-05-24 DIAGNOSIS — F17200 Nicotine dependence, unspecified, uncomplicated: Secondary | ICD-10-CM

## 2011-05-24 DIAGNOSIS — R002 Palpitations: Secondary | ICD-10-CM

## 2011-05-24 DIAGNOSIS — I10 Essential (primary) hypertension: Secondary | ICD-10-CM

## 2011-05-24 NOTE — Progress Notes (Signed)
HPI: [name] is a 66 y/o patient of Dr. Diona Browner we are following for ongoing assessment and treatment of CAD, hyperlipidemia, and hypertension. She comes today with complaints of feeling weak and tired, frequent palpitations. Mild vertigo with the palpitations, and DOE. She has quit smoking for 5 days, and states that the symptoms have scared her into stopping smoking. She uses a wood fireplace for heat and lifting small logs into the fireplace causes extreme fatigue and racing heart rate. She becomes slightly dizzy and has to sit down to recover. She returns to normal within 5 minutes, but no chest pain. She is medically complaint but states she has barely left the house for 3-45 weeks because of fatigue.  No Known Allergies  Current Outpatient Prescriptions  Medication Sig Dispense Refill  . alprazolam (XANAX) 2 MG tablet Take 2 mg by mouth at bedtime as needed.        Marland Kitchen amitriptyline (ELAVIL) 100 MG tablet Take 100 mg by mouth 2 (two) times daily.        Marland Kitchen amLODipine (NORVASC) 5 MG tablet Take 5 mg by mouth daily.        Marland Kitchen aspirin 81 MG tablet Take 81 mg by mouth daily.        . clopidogrel (PLAVIX) 75 MG tablet Take 75 mg by mouth daily.        . furosemide (LASIX) 40 MG tablet Take 40 mg by mouth as needed.        . glyBURIDE (DIABETA) 5 MG tablet Take 5 mg by mouth 2 (two) times daily. 2 tabs bid        . HYDROcodone-acetaminophen (LORTAB) 7.5-500 MG per tablet Take 1 tablet by mouth every 6 (six) hours as needed.        . isosorbide mononitrate (IMDUR) 60 MG 24 hr tablet Take 1 tablet (60 mg total) by mouth daily.  30 tablet  5  . lactulose (CEPHULAC) 10 G packet Take 10 g by mouth 3 (three) times daily.        . metFORMIN (GLUCOPHAGE) 500 MG tablet Take 1,000 mg by mouth 2 (two) times daily with a meal.       . metoprolol (LOPRESSOR) 50 MG tablet Take 50 mg by mouth 2 (two) times daily.        . nitroGLYCERIN (NITROSTAT) 0.4 MG SL tablet Place 1 tablet (0.4 mg total) under the  tongue every 5 (five) minutes as needed.  90 tablet  3  . polyethylene glycol powder (GLYCOLAX/MIRALAX) powder Take 17 g by mouth daily.        . potassium chloride (KLOR-CON) 10 MEQ CR tablet Take 10 mEq by mouth daily.        . pravastatin (PRAVACHOL) 80 MG tablet Take 80 mg by mouth daily.        . Simethicone (GAS-X EXTRA STRENGTH PO) Take by mouth.          Past Medical History  Diagnosis Date  . Mitral regurgitation     Mild to moderate  . Type 2 diabetes mellitus   . Obesity   . PAD (peripheral artery disease)   . Acute ischemic colitis   . Coronary atherosclerosis of native coronary artery     Mulitvessel LVEF 50-50%, DES circ 1/11, occluded SVG to OM and SVG to diagonal , LVEF 60%  . Mixed hyperlipidemia   . Essential hypertension, benign   . Orthostatic hypotension     Past Surgical History  Procedure Date  .  Neck surgery   . Carpal tunnel release   . Partial hysterectomy   . Back surgery   . Coronary artery bypass graft     LIMA-LAD; SVG-OM; SVG-DX in 1995 [facility]  . Carotid endarectomy     UVO:ZDGUYQ of systems complete and found to be negative unless listed above PHYSICAL EXAM BP 120/71  Pulse 63  Resp 16  Ht 5\' 4"  (1.626 m)  Wt 252 lb (114.306 kg)  BMI 43.26 kg/m2  General: Well developed, well nourished, in no acute distress, obese Head: Eyes PERRLA, No xanthomas.   Normal cephalic and atramatic  Lungs: Mild bibasilar wheezes without rales or rhonchi.Prolonged expiratory phase. Heart: HRRR S1 S2, without MRG.  Pulses are 2+ & equal.            No carotid bruit. No JVD.  No abdominal bruits. No femoral bruits. Abdomen: Bowel sounds are positive, abdomen soft and non-tender without masses or                  Hernia's noted. Msk:  Back normal, normal gait. Normal strength and tone for age. Extremities: No clubbing, cyanosis or edema.  DP +1 Neuro: Alert and oriented X 3. Psych:  Flat affect, responds appropriately  EKG:NSR with nonspecific T-wave  abnormality. Rate of 61 bpm.  ASSESSMENT AND PLAN

## 2011-05-24 NOTE — Assessment & Plan Note (Signed)
She has recently quit smoking for the last 5 days. I have encouraged her to continue this.

## 2011-05-24 NOTE — Patient Instructions (Signed)
Your physician has requested that you have an echocardiogram. Echocardiography is a painless test that uses sound waves to create images of your heart. It provides your doctor with information about the size and shape of your heart and how well your heart's chambers and valves are working. This procedure takes approximately one hour. There are no restrictions for this procedure.  Your physician has recommended that you wear a holter monitor. Holter monitors are medical devices that record the heart's electrical activity. Doctors most often use these monitors to diagnose arrhythmias. Arrhythmias are problems with the speed or rhythm of the heartbeat. The monitor is a small, portable device. You can wear one while you do your normal daily activities. This is usually used to diagnose what is causing palpitations/syncope (passing out).  Your physician recommends that you schedule a follow-up appointment in: after test.

## 2011-05-24 NOTE — Assessment & Plan Note (Signed)
Her symptoms to do not appear to be related to CAD at this time, but more from deconditioning from sedentary lifestyle and obesity. However, I will have her wear a 48 hr Holter monitor as she is having these symptoms everyday. I will have an echo completed to evaluate for changes in LV fx and valvular abnormalities. If she remains symptomatic, she may need to have repeat ischemic study at Dr.McDowell's discretion.

## 2011-05-24 NOTE — Assessment & Plan Note (Signed)
Blood pressure is currently well controlled. NO changes in her medicatons at this time.

## 2011-05-30 ENCOUNTER — Ambulatory Visit (HOSPITAL_COMMUNITY)
Admission: RE | Admit: 2011-05-30 | Discharge: 2011-05-30 | Disposition: A | Discharge status: Home / Self Care | Payer: Medicare Other | Source: Ambulatory Visit | Attending: Adult Health | Admitting: Adult Health

## 2011-05-30 ENCOUNTER — Other Ambulatory Visit: Payer: Self-pay | Admitting: *Deleted

## 2011-05-30 DIAGNOSIS — R002 Palpitations: Secondary | ICD-10-CM

## 2011-05-30 DIAGNOSIS — I2510 Atherosclerotic heart disease of native coronary artery without angina pectoris: Secondary | ICD-10-CM

## 2011-05-30 DIAGNOSIS — R079 Chest pain, unspecified: Secondary | ICD-10-CM | POA: Insufficient documentation

## 2011-05-30 DIAGNOSIS — I498 Other specified cardiac arrhythmias: Secondary | ICD-10-CM | POA: Insufficient documentation

## 2011-05-30 DIAGNOSIS — I059 Rheumatic mitral valve disease, unspecified: Secondary | ICD-10-CM

## 2011-05-30 DIAGNOSIS — R0609 Other forms of dyspnea: Secondary | ICD-10-CM | POA: Insufficient documentation

## 2011-05-30 DIAGNOSIS — R0989 Other specified symptoms and signs involving the circulatory and respiratory systems: Secondary | ICD-10-CM | POA: Insufficient documentation

## 2011-05-30 NOTE — Progress Notes (Signed)
*  PRELIMINARY RESULTS* Echocardiogram 48H Holter monitor has been performed.  Conrad Lehigh 05/30/2011, 1:31 PM2

## 2011-05-30 NOTE — Progress Notes (Signed)
*  PRELIMINARY RESULTS* Echocardiogram 2D Echocardiogram has been performed.  [name] 05/30/2011, 1:30 PM

## 2011-06-04 ENCOUNTER — Encounter: Payer: Self-pay | Admitting: Cardiology

## 2011-06-04 ENCOUNTER — Ambulatory Visit (INDEPENDENT_AMBULATORY_CARE_PROVIDER_SITE_OTHER): Payer: Medicare Other | Admitting: Cardiology

## 2011-06-04 VITALS — BP 161/76 | HR 76 | Resp 18 | Ht 64.0 in | Wt 255.0 lb

## 2011-06-04 DIAGNOSIS — I2510 Atherosclerotic heart disease of native coronary artery without angina pectoris: Secondary | ICD-10-CM

## 2011-06-04 DIAGNOSIS — F329 Major depressive disorder, single episode, unspecified: Secondary | ICD-10-CM

## 2011-06-04 DIAGNOSIS — F17200 Nicotine dependence, unspecified, uncomplicated: Secondary | ICD-10-CM

## 2011-06-04 DIAGNOSIS — R002 Palpitations: Secondary | ICD-10-CM

## 2011-06-04 DIAGNOSIS — Z720 Tobacco use: Secondary | ICD-10-CM

## 2011-06-04 DIAGNOSIS — I10 Essential (primary) hypertension: Secondary | ICD-10-CM

## 2011-06-04 MED ORDER — ISOSORBIDE MONONITRATE ER 60 MG PO TB24: 60.0000 mg | ORAL_TABLET | Freq: Every day | ORAL | Status: DC

## 2011-06-04 MED ORDER — ISOSORBIDE MONONITRATE ER 30 MG PO TB24: 60.0000 mg | ORAL_TABLET | Freq: Every day | ORAL | Status: DC

## 2011-06-04 NOTE — Patient Instructions (Signed)
Your physician has recommended you make the following change in your medication: take Imdur (Isosorbide) 30 mg:  2 tablets daily until next refill which will be correct at 60 mg daily.  Your physician recommends that you schedule a follow-up appointment in: 3 month at [facility] office.

## 2011-06-04 NOTE — Assessment & Plan Note (Signed)
She states that she is working on smoking cessation.

## 2011-06-04 NOTE — Assessment & Plan Note (Signed)
We discussed the situation including recent testing, and for now will continue medical therapy and observation. Imdur increased back to 60 mg daily. To followup in the [place] office.

## 2011-06-04 NOTE — Assessment & Plan Note (Signed)
Mild by recent echocardiogram, LVEF 55%.

## 2011-06-04 NOTE — Assessment & Plan Note (Signed)
Symptoms consistent with depression, she states worse since the holidays. I asked her to make an appointment with Dr. Milford Cage. She might also benefit from a referral to behavioral health to assist with medications.

## 2011-06-04 NOTE — Progress Notes (Signed)
Clinical Summary [name] is a 66 y.o.female presenting for followup. She just recently saw [name] is late January and was referred for followup testing, mainly related to palpitations and shortness of breath.  Followup echocardiogram demonstrated stable ejection fraction, LVEF of 55% with basal inferoseptal hypokinesis, grade 1 diastolic dysfunction, mild mitral regurgitation and tricuspid regurgitation.  Holter monitor showed sinus rhythm with rare PVC's and PAC's, no sustained arrhythmias or pauses.  Previous Cardiolite from December 2011 revealed significant breast tissue attenuation as well as evidence of anteroapical and basal inferolateral ischemia. She has been managed medically.  Today she is here with her daughter. She reports only occasional chest pain and has had fewer palpitations, no syncope. We reviewed her test results.  She complains mainly of poor sleep, sometimes unable to sleep and other days sleeping all day. She states that she does not leave her house very much. Has no energy. She has had trouble with depression over time. No suicidal thoughts reported.  Review of her medications finds Imdur at 30 mg daily - was previously 60 mg daily.   No Known Allergies  Current Outpatient Prescriptions  Medication Sig Dispense Refill  . alprazolam (XANAX) 2 MG tablet Take 2 mg by mouth at bedtime as needed.        Marland Kitchen amitriptyline (ELAVIL) 100 MG tablet Take 100 mg by mouth 2 (two) times daily.        Marland Kitchen amLODipine (NORVASC) 5 MG tablet Take 5 mg by mouth daily.        Marland Kitchen aspirin 81 MG tablet Take 81 mg by mouth daily.        . clopidogrel (PLAVIX) 75 MG tablet Take 75 mg by mouth daily.        . furosemide (LASIX) 40 MG tablet Take 40 mg by mouth as needed.        . glyBURIDE (DIABETA) 5 MG tablet Take 5 mg by mouth 2 (two) times daily. 2 tabs bid        . HYDROcodone-acetaminophen (LORTAB) 7.5-500 MG per tablet Take 1 tablet by mouth every 6 (six) hours as needed.         . isosorbide mononitrate (IMDUR) 30 MG 24 hr tablet Take 2 tablets (60 mg total) by mouth daily.  30 tablet  3  . lactulose (CEPHULAC) 10 G packet Take 10 g by mouth 3 (three) times daily.        . metFORMIN (GLUCOPHAGE) 500 MG tablet Take 1,000 mg by mouth 2 (two) times daily with a meal.       . metoprolol (LOPRESSOR) 50 MG tablet Take 50 mg by mouth 2 (two) times daily.        . nitroGLYCERIN (NITROSTAT) 0.4 MG SL tablet Place 1 tablet (0.4 mg total) under the tongue every 5 (five) minutes as needed.  90 tablet  3  . polyethylene glycol powder (GLYCOLAX/MIRALAX) powder Take 17 g by mouth daily.        . potassium chloride (KLOR-CON) 10 MEQ CR tablet Take 10 mEq by mouth daily.        . pravastatin (PRAVACHOL) 80 MG tablet Take 80 mg by mouth daily.        . Simethicone (GAS-X EXTRA STRENGTH PO) Take by mouth.          Past Medical History  Diagnosis Date  . Mitral regurgitation     Mild to moderate  . Type 2 diabetes mellitus   . Obesity   .  PAD (peripheral artery disease)   . Acute ischemic colitis   . Coronary atherosclerosis of native coronary artery     Mulitvessel LVEF 50-50%, DES circ 1/11, occluded SVG to OM and SVG to diagonal , LVEF 60%  . Mixed hyperlipidemia   . Essential hypertension, benign   . Orthostatic hypotension     Past Surgical History  Procedure Date  . Neck surgery   . Carpal tunnel release   . Partial hysterectomy   . Back surgery   . Coronary artery bypass graft     LIMA-LAD; SVG-OM; SVG-DX in 1995 [facility]  . Carotid endarectomy     Family History  Problem Relation Age of Onset  . Coronary artery disease    . Hypertension      Social History [name] reports that she has been smoking Cigarettes.  She has never used smokeless tobacco. [name] reports that she does not drink alcohol.  Review of Systems As outlined above, otherwise negative.  Physical Examination Filed Vitals:   06/04/11 1309  BP: 161/76  Pulse: 76  Resp: 18     Morbidly obese chronically ill-appearing woman in no acute distress.  HEENT: Conjunctiva and lids are grossly normal with ecchymosis on the right, oropharynx with poor dentition.  Neck: Supple, no obvious elevation in JVP, no bruits.  Lungs: Diminished breath sounds without wheezing or labored breathing.  Cardiac: Regular rate and rhythm, no S3 or loud systolic murmur.  Abdomen: Soft, nontender, bowel sounds present  Skin: Warm and dry.  Extremities: No pitting edema, distal pulses one plus.  Musculoskeletal: No kyphosis.  Neuropsychiatric: Alert and oriented x3, affect grossly appropriate.    Problem List and Plan

## 2011-06-04 NOTE — Assessment & Plan Note (Signed)
Holter monitor without significant arrhythmias, rare PVC's and PAC's.

## 2011-06-04 NOTE — Assessment & Plan Note (Signed)
Blood pressure is up today, although she is anxious. She reports compliance with her medications.

## 2011-06-05 NOTE — Procedures (Signed)
[name], [name]             ACCOUNT NO.:  [id number]  MEDICAL RECORD NO.:  [id number]  LOCATION:                                 FACILITY:  PHYSICIAN:  Gerrit Friends. Dietrich Pates, MD, FACCDATE OF BIRTH:  February 07, 1946  DATE OF PROCEDURE:  06/04/2011 DATE OF DISCHARGE:                               HOLTER MONITOR   CLINICAL DATA:  A 66 year old woman with palpitations. 1. Continuous electrocardiographic recording was maintained for 48     hours during which the predominant rhythms were normal sinus and     sinus bradycardia with minimal sinus tachycardia to rates barely     exceeding 100 BPM.  Borderline first-degree AV block was present.     Due to inaccuracies in automatic detection of heart rate, the     amount of sinus bradycardia was difficult to determine.  No     profound bradycardia was identified. 2. Rare PVCs were recorded, occurring at an average rate of 2 per     hour.  Slightly more frequent supraventricular ectopics occurred at     an average rate of 3 per hour. 3. No significant ST-segment depression or elevation was identified.     Minor baseline ST-segment depression was more     prominent at higher heart rates. 4. A blank diary of activity was returned.  The patient verbally     reported no symptoms throughout the time she was wearing the     recorder.     Gerrit Friends. Dietrich Pates, MD, [facility]     RMR/MEDQ  D:  06/04/2011  T:  06/05/2011  Job:  [id number]

## 2011-06-06 ENCOUNTER — Ambulatory Visit: Payer: PRIVATE HEALTH INSURANCE | Admitting: Cardiology

## 2011-08-22 ENCOUNTER — Other Ambulatory Visit: Payer: Self-pay

## 2011-08-25 ENCOUNTER — Encounter: Payer: Self-pay | Admitting: Cardiology

## 2011-09-03 ENCOUNTER — Encounter: Payer: Self-pay | Admitting: Cardiology

## 2011-09-03 ENCOUNTER — Ambulatory Visit (INDEPENDENT_AMBULATORY_CARE_PROVIDER_SITE_OTHER): Payer: Medicare Other | Admitting: Cardiology

## 2011-09-03 VITALS — BP 108/66 | HR 57 | Ht 64.0 in | Wt 248.0 lb

## 2011-09-03 DIAGNOSIS — Z79899 Other long term (current) drug therapy: Secondary | ICD-10-CM

## 2011-09-03 DIAGNOSIS — I2510 Atherosclerotic heart disease of native coronary artery without angina pectoris: Secondary | ICD-10-CM

## 2011-09-03 DIAGNOSIS — R002 Palpitations: Secondary | ICD-10-CM

## 2011-09-03 DIAGNOSIS — I10 Essential (primary) hypertension: Secondary | ICD-10-CM

## 2011-09-03 NOTE — Patient Instructions (Signed)
Follow up in 4 months. Your physician recommends that you continue on your current medications as directed. Please refer to the Current Medication list given to you today. Your physician recommends that you go to outpatient registration at [facility] for lab work: BMET--DO LABS A FEW DAYS BEFORE 4 MONTH FOLLOW UP APPOINTMENT.

## 2011-09-03 NOTE — Progress Notes (Signed)
Clinical Summary [name] is a 66 y.o.female presenting for followup. She was seen in February. She describes no progressive chest pain symptoms. Still has occasional palpitations and weakness. She states that she was seen in the ER approximately a week ago with finding of low potassium and low magnesium, had her supplements increased. States she feels somewhat better and is to follow up with Dr. Milford Cage.  We reviewed her medications. In light of her cardiovascular evaluation within the last 1-2 years, plan is to continue medical therapy and observation for now.  We continue to discuss smoking cessation strategies, although she has not been able to quit.   No Known Allergies  Current Outpatient Prescriptions  Medication Sig Dispense Refill  . alprazolam (XANAX) 2 MG tablet Take 2 mg by mouth at bedtime as needed.        Marland Kitchen amitriptyline (ELAVIL) 100 MG tablet Take 100 mg by mouth 2 (two) times daily.        Marland Kitchen amLODipine (NORVASC) 5 MG tablet Take 5 mg by mouth daily.        Marland Kitchen aspirin 81 MG tablet Take 81 mg by mouth daily.        . clopidogrel (PLAVIX) 75 MG tablet Take 75 mg by mouth daily.        . furosemide (LASIX) 40 MG tablet Take 40 mg by mouth as needed.        . glyBURIDE (DIABETA) 5 MG tablet Take 5 mg by mouth 2 (two) times daily. 2 tabs bid        . HYDROcodone-acetaminophen (LORTAB) 7.5-500 MG per tablet Take 1 tablet by mouth every 6 (six) hours as needed.        . isosorbide mononitrate (IMDUR) 60 MG 24 hr tablet Take 1 tablet (60 mg total) by mouth daily.  30 tablet  3  . lactulose (CEPHULAC) 10 G packet Take 10 g by mouth 3 (three) times daily.        Marland Kitchen MAGNESIUM CHLORIDE ER PO Take 1 tablet by mouth daily.      . metFORMIN (GLUCOPHAGE) 500 MG tablet Take 1,000 mg by mouth 2 (two) times daily with a meal.       . metoprolol (LOPRESSOR) 50 MG tablet Take 50 mg by mouth 2 (two) times daily.        . nitroGLYCERIN (NITROSTAT) 0.4 MG SL tablet Place 1 tablet (0.4 mg total)  under the tongue every 5 (five) minutes as needed.  90 tablet  3  . polyethylene glycol powder (GLYCOLAX/MIRALAX) powder Take 17 g by mouth daily.        . potassium chloride (KLOR-CON) 10 MEQ CR tablet Take 10 mEq by mouth 2 (two) times daily.       . pravastatin (PRAVACHOL) 80 MG tablet Take 80 mg by mouth daily.        . Simethicone (GAS-X EXTRA STRENGTH PO) Take by mouth.          Past Medical History  Diagnosis Date  . Mitral regurgitation     Mild to moderate  . Type 2 diabetes mellitus   . Obesity   . PAD (peripheral artery disease)   . Acute ischemic colitis   . Coronary atherosclerosis of native coronary artery     Mulitvessel LVEF 50-50%, DES circ 1/11, occluded SVG to OM and SVG to diagonal , LVEF 60%  . Mixed hyperlipidemia   . Essential hypertension, benign   . Orthostatic hypotension  Past Surgical History  Procedure Date  . Neck surgery   . Carpal tunnel release   . Partial hysterectomy   . Back surgery   . Coronary artery bypass graft     LIMA-LAD; SVG-OM; SVG-DX in 1995 [facility]  . Carotid endarectomy     Social History [name] reports that she has been smoking Cigarettes.  She has a 40 pack-year smoking history. She has never used smokeless tobacco. [name] reports that she does not drink alcohol.  Review of Systems Better appetite. Still having trouble with sleep. Reports restless legs, also tingling in her arms. No reported bleeding problems. No syncope. Otherwise negative.  Physical Examination Filed Vitals:   09/03/11 1051  BP: 108/66  Pulse: 57    Morbidly obese chronically ill-appearing woman in no acute distress.  HEENT: Conjunctiva and lids are grossly normal with ecchymosis on the right, oropharynx with poor dentition.  Neck: Supple, no obvious elevation in JVP, no bruits.  Lungs: Diminished breath sounds without wheezing or labored breathing.  Cardiac: Regular rate and rhythm, no S3 or loud systolic murmur.  Abdomen: Soft,  nontender, bowel sounds present  Skin: Warm and dry.  Extremities: No pitting edema, distal pulses one plus.    ECG Recent ECG from late April reviewed showing sinus bradycardia with low voltage and nonspecific ST - T wave changes.    Problem List and Plan

## 2011-09-03 NOTE — Assessment & Plan Note (Signed)
She has had documented atrial and ventricular ectopy. Hopefully this will be improved on higher dose potassium and magnesium supplements with continuation of beta blocker.

## 2011-09-03 NOTE — Assessment & Plan Note (Signed)
Blood pressure well-controlled today. 

## 2011-09-03 NOTE — Assessment & Plan Note (Signed)
Continue medical therapy and observation for now. We reviewed her previous testing.

## 2011-10-22 ENCOUNTER — Other Ambulatory Visit: Payer: Self-pay | Admitting: Cardiology

## 2011-10-22 MED ORDER — ISOSORBIDE MONONITRATE ER 60 MG PO TB24: 60.0000 mg | ORAL_TABLET | Freq: Every day | ORAL | Status: DC

## 2012-01-03 ENCOUNTER — Encounter: Payer: Self-pay | Admitting: Physician Assistant

## 2012-01-03 ENCOUNTER — Ambulatory Visit (INDEPENDENT_AMBULATORY_CARE_PROVIDER_SITE_OTHER): Payer: Medicare Other | Admitting: Physician Assistant

## 2012-01-03 ENCOUNTER — Encounter (INDEPENDENT_AMBULATORY_CARE_PROVIDER_SITE_OTHER): Payer: Medicare Other

## 2012-01-03 VITALS — BP 123/69 | HR 60 | Ht 64.0 in | Wt 227.8 lb

## 2012-01-03 DIAGNOSIS — F17200 Nicotine dependence, unspecified, uncomplicated: Secondary | ICD-10-CM

## 2012-01-03 DIAGNOSIS — R0989 Other specified symptoms and signs involving the circulatory and respiratory systems: Secondary | ICD-10-CM

## 2012-01-03 DIAGNOSIS — I6529 Occlusion and stenosis of unspecified carotid artery: Secondary | ICD-10-CM

## 2012-01-03 DIAGNOSIS — E785 Hyperlipidemia, unspecified: Secondary | ICD-10-CM

## 2012-01-03 DIAGNOSIS — I2510 Atherosclerotic heart disease of native coronary artery without angina pectoris: Secondary | ICD-10-CM

## 2012-01-03 DIAGNOSIS — I10 Essential (primary) hypertension: Secondary | ICD-10-CM

## 2012-01-03 MED ORDER — VARENICLINE TARTRATE 0.5 MG X 11 & 1 MG X 42 PO MISC: ORAL | Status: DC

## 2012-01-03 NOTE — Assessment & Plan Note (Signed)
Quiescent on current medication regimen, which includes ASA, Plavix, Norvasc, statin, and long-acting nitrates. We'll reassess clinical status in 6 months.

## 2012-01-03 NOTE — Assessment & Plan Note (Signed)
Will reassess lipid status with a FLP/LFT profile. Target LDL 70 or less, if feasible. Continue current high-dose dose pravastatin, pending further recommendations.

## 2012-01-03 NOTE — Assessment & Plan Note (Signed)
Patient has agreed to try Chantix, and we will provide a prescription for her.

## 2012-01-03 NOTE — Assessment & Plan Note (Signed)
Well-controlled on current medication regimen 

## 2012-01-03 NOTE — Progress Notes (Signed)
Primary Cardiologist: Simona Huh, MD   HPI: Patient presents for scheduled office followup.  Since last seen here in clinic in May, by Dr. Diona Browner, patient denies any exacerbation of her baseline exercise tolerance level, which includes occasional exertional chest tightness. However, her predominant complaint appears to be that of exertional dyspnea. She currently is also complaining of back pain, which appears to be chronic, and also refers to problems with her "colon". She talked about possibly requiring epidural injections, for which she was told she would have to hold Plavix for 10 days, prior to undergoing the procedure. At this point, however, she is disinclined to proceed.  Unfortunately, she continues to smoke cigarettes, approximately half a pack a day, and still finds it difficult to stop completely.    Most recent ischemic evaluation, [place] scan Cardiolite, 4/11: Small reversible defect consistent with antero-apical/basal inferolateral ischemia; 69%  No Known Allergies  Current Outpatient Prescriptions  Medication Sig Dispense Refill  . alprazolam (XANAX) 2 MG tablet Take 2 mg by mouth at bedtime as needed.        Marland Kitchen amLODipine (NORVASC) 5 MG tablet Take 5 mg by mouth daily.        Marland Kitchen aspirin 81 MG tablet Take 81 mg by mouth daily.        . clopidogrel (PLAVIX) 75 MG tablet Take 75 mg by mouth daily.        . furosemide (LASIX) 40 MG tablet Take 40 mg by mouth daily.       Marland Kitchen gabapentin (NEURONTIN) 300 MG capsule Take 300 mg by mouth 2 (two) times daily.      Marland Kitchen glyBURIDE (DIABETA) 5 MG tablet Take 5 mg by mouth 2 (two) times daily. 2 tabs bid        . isosorbide mononitrate (IMDUR) 60 MG 24 hr tablet Take 1 tablet (60 mg total) by mouth daily.  30 tablet  3  . lactulose (CEPHULAC) 10 G packet Take 10 g by mouth 3 (three) times daily.        . metFORMIN (GLUCOPHAGE) 500 MG tablet Take 1,000 mg by mouth 2 (two) times daily with a meal.       . metoprolol (LOPRESSOR) 50 MG tablet  Take 50 mg by mouth 2 (two) times daily.        . polyethylene glycol powder (GLYCOLAX/MIRALAX) powder Take 17 g by mouth daily.        . potassium chloride (KLOR-CON) 10 MEQ CR tablet Take 10 mEq by mouth daily.       . pravastatin (PRAVACHOL) 80 MG tablet Take 80 mg by mouth daily.        . Simethicone (GAS-X EXTRA STRENGTH PO) Take by mouth daily.       . nitroGLYCERIN (NITROSTAT) 0.4 MG SL tablet Place 1 tablet (0.4 mg total) under the tongue every 5 (five) minutes as needed.  90 tablet  3  . varenicline (CHANTIX STARTING MONTH PAK) 0.5 MG X 11 & 1 MG X 42 tablet Take one 0.5 mg tablet by mouth once daily for 3 days, then increase to one 0.5 mg tablet twice daily for 4 days, then increase to one 1 mg tablet twice daily.  53 tablet  0    Past Medical History  Diagnosis Date  . Mitral regurgitation     Mild to moderate  . Type 2 diabetes mellitus   . Obesity   . PAD (peripheral artery disease)   . Acute ischemic colitis   .  Coronary atherosclerosis of native coronary artery     Mulitvessel LVEF 50-50%, DES circ 1/11, occluded SVG to OM and SVG to diagonal , LVEF 60%  . Mixed hyperlipidemia   . Essential hypertension, benign   . Orthostatic hypotension     Past Surgical History  Procedure Date  . Neck surgery   . Carpal tunnel release   . Partial hysterectomy   . Back surgery   . Coronary artery bypass graft     LIMA-LAD; SVG-OM; SVG-DX in 1995 [facility]  . Carotid endarectomy     History   Social History  . Marital Status: Widowed    Spouse Name: N/A    Number of Children: N/A  . Years of Education: N/A   Occupational History  .    Marland Kitchen DISABLED    Social History Main Topics  . Smoking status: Current Everyday Smoker -- 0.8 packs/day for 50 years    Types: Cigarettes  . Smokeless tobacco: Never Used  . Alcohol Use: No  . Drug Use: No  . Sexually Active: Not on file   Other Topics Concern  . Not on file   Social History Narrative  . No narrative on file   Social  History Narrative  . No narrative on file    Problem Relation Age of Onset  . Coronary artery disease    . Hypertension      ROS: no nausea, vomiting; no fever, chills; no melena, hematochezia; no claudication  PHYSICAL EXAM: BP 123/69  Pulse 60  Ht 5\' 4"  (1.626 m)  Wt 227 lb 12.8 oz (103.329 kg)  BMI 39.10 kg/m2  SpO2 97% GENERAL: 66 year old female, morbidly obese; NAD HEENT: NCAT, PERRLA, EOMI; sclera clear; no xanthelasma NECK: Soft right carotid bruit; no JVD; no TM LUNGS: CTA bilaterally CARDIAC: RRR (S1, S2); no significant murmurs; no rubs or gallops ABDOMEN: Protuberant EXTREMETIES: no significant peripheral edema SKIN: warm/dry; no obvious rash/lesions MUSCULOSKELETAL: no joint deformity NEURO: no focal deficit; NL affect   EKG: reviewed and available in Electronic Records   ASSESSMENT & PLAN:  CAD, NATIVE VESSEL Quiescent on current medication regimen, which includes ASA, Plavix, Norvasc, statin, and long-acting nitrates. Of note, if patient does elect to proceed with epidural injections for treatment of her back pain, would recommend holding Plavix for only 5 days, rather than 10, prior to her procedure. We'll reassess clinical status in 6 months.  Essential hypertension, benign Well-controlled on current medication regimen  HYPERLIPIDEMIA-MIXED Will reassess lipid status with a FLP/LFT profile. Target LDL 70 or less, if feasible. Continue current high-dose dose pravastatin, pending further recommendations.  Carotid bruit Will order carotid Dopplers to further evaluate right sided bruit, to rule out significant ICA disease. Patient is status post remote left CEA.  TOBACCO USER Patient has agreed to try Chantix, and we will provide a prescription for her.    Gene Arayna Illescas, PAC

## 2012-01-03 NOTE — Assessment & Plan Note (Signed)
Will order carotid Dopplers to further evaluate right sided bruit, to rule out significant ICA disease. Patient is status post remote left CEA.

## 2012-01-03 NOTE — Patient Instructions (Addendum)
   Labs:  Fasting lipid and liver panel - Reminder:  Nothing to eat or drink after 12 midnight prior to labs.  Stop Tobacco  Chantix - as directed  Carotid Dopplers  Office will contact with results Your physician wants you to follow up in: 6 months.  You will receive a reminder letter in the mail one-two months in advance.  If you don't receive a letter, please call our office to schedule the follow up appointment

## 2012-01-07 ENCOUNTER — Encounter: Payer: Self-pay | Admitting: *Deleted

## 2012-01-10 ENCOUNTER — Encounter: Payer: Self-pay | Admitting: *Deleted

## 2012-01-22 ENCOUNTER — Ambulatory Visit: Payer: Medicare Other | Admitting: Cardiology

## 2012-01-28 ENCOUNTER — Ambulatory Visit: Payer: Medicare Other | Admitting: Cardiology

## 2012-02-14 LAB — CBC
HCT: 41 %
Hemoglobin: 13.7 g/dL (ref 12.0–16.0)
WBC: 5.6

## 2012-02-14 LAB — COMPREHENSIVE METABOLIC PANEL
ALT: 9 U/L (ref 7–35)
AST: 14 U/L
Albumin: 3.9
BUN/Creatinine Ratio: 16
BUN: 11 mg/dL (ref 4–21)
Chloride: 102 mmol/L
Creat: 0.68
Potassium: 3.4 mmol/L
Sodium: 144 mmol/L (ref 137–147)

## 2012-03-06 ENCOUNTER — Ambulatory Visit: Payer: Medicare Other | Admitting: Gastroenterology

## 2012-03-13 ENCOUNTER — Ambulatory Visit: Payer: Medicare Other | Admitting: Gastroenterology

## 2012-03-18 ENCOUNTER — Encounter: Payer: Self-pay | Admitting: Urgent Care

## 2012-03-18 ENCOUNTER — Ambulatory Visit (INDEPENDENT_AMBULATORY_CARE_PROVIDER_SITE_OTHER): Payer: Medicare Other | Admitting: Urgent Care

## 2012-03-18 VITALS — BP 135/73 | HR 61 | Temp 98.8°F | Ht 64.0 in | Wt 230.0 lb

## 2012-03-18 DIAGNOSIS — R109 Unspecified abdominal pain: Secondary | ICD-10-CM | POA: Insufficient documentation

## 2012-03-18 DIAGNOSIS — R634 Abnormal weight loss: Secondary | ICD-10-CM | POA: Insufficient documentation

## 2012-03-18 DIAGNOSIS — K529 Noninfective gastroenteritis and colitis, unspecified: Secondary | ICD-10-CM | POA: Insufficient documentation

## 2012-03-18 DIAGNOSIS — R197 Diarrhea, unspecified: Secondary | ICD-10-CM

## 2012-03-18 NOTE — Progress Notes (Signed)
Referring Provider: Dr Leandrew Koyanagi Primary Care Physician:  Juliette Alcide, MD Primary Gastroenterologist:  Dr. Jena Gauss  Chief Complaint  Patient presents with  . Abdominal Pain  . Diarrhea    HPI:  [name] is a 66 y.o. female here as a referral from Dr. Leandrew Koyanagi for abdominal pain.  1 yr ago, she started to notice abdominal pain.  She has also been having watery diarrhea 6-10 times per day.  She may skip days at times.  She denies ectal bleeding or melena.  The pain is worse after she eats.  She has had 3 ER visits for this pain.  She describes the pain as a sharp pain in both her sides & it radiates around to her back.  Pains also shoot to her lower abdomen like a knife.  Pain 3/10 now.  Pain is intermittent.  She has had a weight loss of 47# unintentionally over past year.  She denies foreign travel, no new meds or pets.  No thyroid problems.  C/o occasional nausea.  Rare vomiting 4-5 times over past year.   Last colonoscopy 2007 by Dr Leone Payor for ischemic colitis (unfortunately complete report unavailable to me).    Negative complete abdominal ultrasound to [facility] 01/31/12. 02/14/12 labs CBC, CMP normal except potassium 3.4.  Hemoglobin A1c 6.1   Past Medical History  Diagnosis Date  . Mitral regurgitation     Mild to moderate  . Type 2 diabetes mellitus   . Obesity   . PAD (peripheral artery disease)   . Acute ischemic colitis 09/11/2005  . Coronary atherosclerosis of native coronary artery     Mulitvessel LVEF 50-50%, DES circ 1/11, occluded SVG to OM and SVG to diagonal , LVEF 60%  . Mixed hyperlipidemia   . Essential hypertension, benign   . Orthostatic hypotension   . Anxiety   . Depression   . Hypothyroidism   . PVC's (premature ventricular contractions)     Past Surgical History  Procedure Date  . Neck surgery     Cervical laminectomy  . Carpal tunnel release     Bilateral  . Partial hysterectomy   . Back surgery     Lumbar spine surgery    . Coronary artery bypass graft     LIMA-LAD; SVG-OM; SVG-DX in 1995 [facility]  . Carotid endarectomy     Left  . Colonoscopy 09/14/2005    Dr. Jacqlyn Larsen colitis    Current Outpatient Prescriptions  Medication Sig Dispense Refill  . alprazolam (XANAX) 2 MG tablet Take 2 mg by mouth at bedtime as needed.       Marland Kitchen amLODipine (NORVASC) 5 MG tablet Take 5 mg by mouth daily.        Marland Kitchen aspirin 81 MG tablet Take 81 mg by mouth daily.        . clopidogrel (PLAVIX) 75 MG tablet Take 75 mg by mouth daily.        . fluvoxaMINE (LUVOX) 50 MG tablet Take 50 mg by mouth at bedtime.      . furosemide (LASIX) 40 MG tablet Take 40 mg by mouth daily.       Marland Kitchen gabapentin (NEURONTIN) 300 MG capsule Take 400 mg by mouth 4 (four) times daily.       Marland Kitchen glyBURIDE (DIABETA) 5 MG tablet Take 5 mg by mouth 2 (two) times daily. Takes two 5 mg tablets bid      . hydrOXYzine (ATARAX/VISTARIL) 25 MG tablet Take 25 mg by mouth 3 (three)  times daily as needed. Ocassionally for allergies      . isosorbide mononitrate (IMDUR) 60 MG 24 hr tablet Take 1 tablet (60 mg total) by mouth daily.  30 tablet  3  . metFORMIN (GLUCOPHAGE) 500 MG tablet Take 1,000 mg by mouth 2 (two) times daily with a meal.       . metoprolol (LOPRESSOR) 50 MG tablet Take 50 mg by mouth 1 day or 1 dose.       . nitroGLYCERIN (NITROSTAT) 0.4 MG SL tablet Place 1 tablet (0.4 mg total) under the tongue every 5 (five) minutes as needed.  90 tablet  3  . NON FORMULARY Percocet 7.5/325 mg  One tablet  every 6-8 hours as needed      . omeprazole (PRILOSEC) 20 MG capsule Take 20 mg by mouth daily. Just occasionally      . pravastatin (PRAVACHOL) 80 MG tablet Take 80 mg by mouth daily.         No current facility-administered medications for this visit.   Facility-Administered Medications Ordered in Other Visits  Medication Dose Route Frequency Provider Last Rate Last Dose  . [COMPLETED] iohexol (OMNIPAQUE) 300 MG/ML solution 100 mL  100 mL Intravenous Once  PRN Medication Radiologist, MD   100 mL at 03/21/12 0915    Allergies as of 03/18/2012  . (No Known Allergies)    Family History:There is no known family history of colorectal carcinoma or liver disease.  Problem Relation Age of Onset  . Coronary artery disease    . Hypertension    . Crohn's disease Sister 79    History   Social History  . Marital Status: Widowed    Spouse Name: N/A    Number of Children: 4  . Years of Education: N/A   Occupational History  . DISABLED    Social History Main Topics  . Smoking status: Current Every Day Smoker -- 0.8 packs/day for 50 years    Types: Cigarettes  . Smokeless tobacco: Never Used  . Alcohol Use: Yes     Comment: rare etoh 3-4 drinks per yr, hx beer heavily for 10-12 yrs, quit heavy etoh 1990  . Drug Use: No  . Sexually Active: Not on file   Other Topics Concern  . Not on file   Social History Narrative   LIves alone   Review of Systems: Gen: see HPI CV: Denies chest pain, angina, palpitations, syncope, orthopnea, PND, peripheral edema, and claudication. Resp: Denies dyspnea at rest, dyspnea with exercise, cough, sputum, wheezing, coughing up blood, and pleurisy. GI: Denies vomiting blood, jaundice, and fecal incontinence.   Denies dysphagia or odynophagia. GU : Denies urinary burning, blood in urine, urinary frequency, urinary hesitancy, nocturnal urination, and urinary incontinence. MS: Denies joint pain, limitation of movement, and swelling, stiffness, low back pain, extremity pain. Denies muscle weakness, cramps, atrophy.  Derm: Denies rash, itching, dry skin, hives, moles, warts, or unhealing ulcers.  Psych: Denies depression, anxiety, memory loss, suicidal ideation, hallucinations, paranoia, and confusion. Heme: Denies bruising, bleeding, and enlarged lymph nodes. Neuro:  Denies any headaches, dizziness, paresthesias. Endo:  Denies any problems with DM, thyroid, adrenal function.  Physical Exam: BP 135/73  Pulse  61  Temp 98.8 F (37.1 C) (Oral)  Ht 5\' 4"  (1.626 m)  Wt 230 lb (104.327 kg)  BMI 39.48 kg/m2 No LMP recorded. Patient is not currently having periods (Reason: Other). General:   Alert,  Well-developed, well-nourished, pleasant and cooperative in NAD.  Accompanied by her daughter.  Head:  Normocephalic and atraumatic. Eyes:  Sclera clear, no icterus.   Conjunctiva pink. Ears:  Normal auditory acuity. Nose:  No deformity, discharge, or lesions. Mouth:  No deformity or lesions,oropharynx pink & moist. Neck:  Supple; no masses or thyromegaly. Lungs:  Clear throughout to auscultation.   No wheezes, crackles, or rhonchi. No acute distress. Heart:  Regular rate and rhythm; no murmurs, clicks, rubs,  or gallops. Abdomen:  Normal bowel sounds.  No bruits.  Soft and non-distended.  Mild TTP entire abdomen.  No masses, hepatosplenomegaly or hernias noted.  No guarding or rebound tenderness.   Rectal:  Deferred. Msk:  Symmetrical without gross deformities. Normal posture. Pulses:  Normal pulses noted. Extremities:  No clubbing or edema. Neurologic:  Alert and oriented x4;  grossly normal neurologically. Skin:  Intact without significant lesions or rashes. Lymph Nodes:  No significant cervical adenopathy. Psych:  Alert and cooperative. Normal mood and affect.

## 2012-03-18 NOTE — Patient Instructions (Addendum)
Return stools to the lab & get blood work as soon as possible We will call you with the results of your CT scan & labs Begin ALIGN 1 daily for diarrhea (over the counter) 1-800-QUIT-NOW for help quitting smoking

## 2012-03-19 LAB — TISSUE TRANSGLUTAMINASE, IGA: Tissue Transglutaminase Ab, IgA: 4.6 U/mL (ref <20)

## 2012-03-20 ENCOUNTER — Ambulatory Visit: Payer: Medicare Other | Admitting: Physician Assistant

## 2012-03-21 ENCOUNTER — Telehealth: Payer: Self-pay | Admitting: Urgent Care

## 2012-03-21 ENCOUNTER — Other Ambulatory Visit: Payer: Self-pay | Admitting: Internal Medicine

## 2012-03-21 ENCOUNTER — Ambulatory Visit (HOSPITAL_COMMUNITY)
Admission: RE | Admit: 2012-03-21 | Discharge: 2012-03-21 | Disposition: A | Discharge status: Home / Self Care | Payer: Medicare Other | Source: Ambulatory Visit | Attending: Urgent Care | Admitting: Urgent Care

## 2012-03-21 DIAGNOSIS — K559 Vascular disorder of intestine, unspecified: Secondary | ICD-10-CM

## 2012-03-21 DIAGNOSIS — R197 Diarrhea, unspecified: Secondary | ICD-10-CM | POA: Insufficient documentation

## 2012-03-21 DIAGNOSIS — R634 Abnormal weight loss: Secondary | ICD-10-CM | POA: Insufficient documentation

## 2012-03-21 DIAGNOSIS — R109 Unspecified abdominal pain: Secondary | ICD-10-CM | POA: Insufficient documentation

## 2012-03-21 LAB — POCT I-STAT, CHEM 8
Creatinine, Ser: 0.9 mg/dL (ref 0.50–1.10)
Glucose, Bld: 59 mg/dL — ABNORMAL LOW (ref 70–99)
Hemoglobin: 15 g/dL (ref 12.0–15.0)
TCO2: 29 mmol/L (ref 0–100)

## 2012-03-21 LAB — CLOSTRIDIUM DIFFICILE BY PCR: Toxigenic C. Difficile by PCR: NOT DETECTED

## 2012-03-21 MED ORDER — IOHEXOL 300 MG/ML  SOLN
100.0000 mL | Freq: Once | INTRAMUSCULAR | Status: AC | PRN
  Administered 2012-03-21: 100 mL via INTRAVENOUS

## 2012-03-21 NOTE — Progress Notes (Signed)
Faxed to PCP

## 2012-03-21 NOTE — Telephone Encounter (Signed)
CT shows significant atherosclerotic disease/calcified plaque at the origin of the celiac and SMA.  Heavily calcified abdominal aorta without aneurysm. I discussed w/ Dr Jena Gauss.   Please arrange consult with IR to determine if further work-up is warranted of significant plaque ? cause of chronic mesenteric ischemia, postprandial abdominal pain, wt loss 47# in 1 year.   Labs show K 3.1.  She already has KCl that she was taking once daily until about 1 month ago.  She stopped taking when she transitioned from Dr Milford Cage to Dr Leandrew Koyanagi & forgot to mention to Dr Leandrew Koyanagi.   Advised pt to take 2 KCl now, 2 Kcl tonight.  Pt adivsed to call Dr  Leandrew Koyanagi to see if she should resume potassium daily.  Pt to go to ER if severe pain.  Soledad Gerlach, please set up IR consult as soon as possible for pt as above Thanks

## 2012-03-21 NOTE — Progress Notes (Signed)
Quick Note:  See phone note WG:[id number] E, MD  ______

## 2012-03-21 NOTE — Assessment & Plan Note (Signed)
[name] is a pleasant 66 y.o. female with severe abdominal pain that is worse postprandially along with 47# weight loss in past year.   Pain radiates to entire abdomen & back.  She is a known vasculopath & has hx ischemic colitis in 2007.  She has some intermittent diarrhea.  CT A/P with IV/oral contrast ASAP was ordered to further evaluate for ischemia, diverticulitis, pancreatitis or less likely obstruction.

## 2012-03-21 NOTE — Telephone Encounter (Signed)
Order has been sent to IR and Tammy will contact the patient with date & time of appointment

## 2012-03-21 NOTE — Assessment & Plan Note (Addendum)
Chronic diarrhea is intermittent, usually post-prandial.  Diarrhea does not always correlate with her pain.  Differentials are wide.  Since last colonoscopy was in 2007, we may need to reevaluate if diarrhea is persistent.  Full set of stool studies TTG IgA & IgA, TSH Begin ALIGN 1 daily for diarrhea (over the counter) 1-800-QUIT-NOW for help quitting smoking

## 2012-03-21 NOTE — Assessment & Plan Note (Signed)
47# weight loss over past year.  See work-up per diarrhea & abdominal pain.

## 2012-03-21 NOTE — Progress Notes (Signed)
Quick Note:  See CT report Cc:BURDINE,STEVEN E, MD  ______ 

## 2012-03-24 LAB — STOOL CULTURE

## 2012-03-26 LAB — FECAL LACTOFERRIN, QUANT: Lactoferrin: NEGATIVE

## 2012-03-26 NOTE — Progress Notes (Signed)
Results faxed to PCP, left message for pt to call back to schedule appt in Dec

## 2012-03-26 NOTE — Progress Notes (Signed)
Quick Note:  Please let t know stool studies are normal. How is diarrhea? Keep appt with interventional radiology Please schedule appt for follow up after IR consult ZO:[id number] E, MD   ______

## 2012-03-26 NOTE — Progress Notes (Signed)
Quick Note:  Pt aware, she said she is still having diarrhea 2-3 times a day. Some days are worse than others. She is aware of her appt with IR on 04/02/12 Dawn, please cc Dr. Leandrew Koyanagi ______

## 2012-03-26 NOTE — Progress Notes (Signed)
Quick Note:  Ok, please let pt know to continue ALIGN daily. May take imodium 2mg  daily PRN diarrhea If diarrhea persists, let us know, so we can arrange colonoscopy Please arrange FU OV 2nd week Dec re:abd pain, diarrhea Thanks ______

## 2012-03-31 ENCOUNTER — Encounter: Payer: Self-pay | Admitting: Urgent Care

## 2012-03-31 NOTE — Progress Notes (Signed)
Quick Note:  Spoke w/ pt. Having 2 loose stools per day. No significant pain, Some nausea. Appt Wed w/ IR. Advised pt to call after IR appt. If stable, We may set up colonoscopy/EGD w/ RMR at that time re: wt loss, nausea, diarrhea I have discussed risks & benefits which include, but are not limited to, bleeding, infection, perforation & drug reaction. The patient agrees with this plan & written consent will be obtained.  Will need Phenergan 25mg  IV 30 min prior to procedures to augment sedation re: polypharmacy Take half of glyburide & glucophage the day prior to the procedure Early morning appointment-hold diabetes medications day of procedure Bring all your medications and/or any insulin to the hospital the day of the procedure. Follow blood sugars, call us or your PCP if any problems.  ______

## 2012-04-02 ENCOUNTER — Ambulatory Visit
Admission: RE | Admit: 2012-04-02 | Discharge: 2012-04-02 | Disposition: A | Discharge status: Home / Self Care | Payer: Medicare Other | Source: Ambulatory Visit | Attending: Internal Medicine | Admitting: Internal Medicine

## 2012-04-02 ENCOUNTER — Other Ambulatory Visit: Payer: Self-pay | Admitting: Interventional Radiology

## 2012-04-02 DIAGNOSIS — K559 Vascular disorder of intestine, unspecified: Secondary | ICD-10-CM

## 2012-04-04 ENCOUNTER — Ambulatory Visit (HOSPITAL_COMMUNITY)
Admission: RE | Admit: 2012-04-04 | Discharge: 2012-04-04 | Disposition: A | Discharge status: Home / Self Care | Payer: Medicare Other | Source: Ambulatory Visit | Attending: Interventional Radiology | Admitting: Interventional Radiology

## 2012-04-04 DIAGNOSIS — I774 Celiac artery compression syndrome: Secondary | ICD-10-CM | POA: Insufficient documentation

## 2012-04-04 DIAGNOSIS — K559 Vascular disorder of intestine, unspecified: Secondary | ICD-10-CM

## 2012-04-04 DIAGNOSIS — R109 Unspecified abdominal pain: Secondary | ICD-10-CM | POA: Insufficient documentation

## 2012-04-04 MED ORDER — IOHEXOL 350 MG/ML SOLN: 100.0000 mL | Freq: Once | INTRAVENOUS | Status: AC | PRN

## 2012-04-07 ENCOUNTER — Other Ambulatory Visit: Payer: Self-pay | Admitting: Internal Medicine

## 2012-04-07 ENCOUNTER — Telehealth: Payer: Self-pay | Admitting: Urgent Care

## 2012-04-07 ENCOUNTER — Encounter (HOSPITAL_COMMUNITY): Payer: Self-pay | Admitting: Pharmacy Technician

## 2012-04-07 DIAGNOSIS — R109 Unspecified abdominal pain: Secondary | ICD-10-CM

## 2012-04-07 DIAGNOSIS — R634 Abnormal weight loss: Secondary | ICD-10-CM

## 2012-04-07 DIAGNOSIS — R110 Nausea: Secondary | ICD-10-CM

## 2012-04-07 DIAGNOSIS — R197 Diarrhea, unspecified: Secondary | ICD-10-CM

## 2012-04-07 MED ORDER — PEG 3350-KCL-NA BICARB-NACL 420 G PO SOLR: 4000.0000 mL | ORAL | Status: DC

## 2012-04-07 NOTE — Telephone Encounter (Signed)
Will FWD to Dr Jena Gauss

## 2012-04-07 NOTE — Telephone Encounter (Signed)
Please arrange colonoscopy & EGD ASAP with Dr Jena Gauss re: wt loss, nausea, diarrhea, abd pain Will need Phenergan 25mg  IV 30 min prior to procedures to augment sedation re: polypharmacy Take half of glyburide & glucophage the day prior to the procedure Early morning appointment-hold diabetes medications day of procedure Bring all your medications and/or any insulin to the hospital the day of the procedure. Follow blood sugars, call us or your PCP if any problems. Thanks

## 2012-04-07 NOTE — Telephone Encounter (Signed)
Call from Dr Simonne Come (IR).  Pt has significant celiac, SMA (long segment), & IMA (origin) stenosis.  She may be difficult case due to significantly calcified iliac.  She did not give hx of classic mesenteric ischemia symptoms to him, however he notes it was difficult to obtain a history from pt.  He recommends proceed with colonoscopy/EGD with Dr Jena Gauss.  If persistent symptoms & nothing to explain on procedures, he would like to see pt back in consult prior to angio with possible stent placement if pt agrees.

## 2012-04-07 NOTE — Telephone Encounter (Signed)
Patient is scheduled with KJ on 12/12 for consult for TCS/EGD

## 2012-04-07 NOTE — Telephone Encounter (Signed)
Patient called back and she is aware

## 2012-04-07 NOTE — Telephone Encounter (Signed)
Patient is scheduled for TCS/EGD with RMR on Monday 04/14/12 and I have mailed instructions and I have LMOM for her to return my call to confirm

## 2012-04-10 ENCOUNTER — Ambulatory Visit: Payer: Medicare Other | Admitting: Urgent Care

## 2012-04-14 ENCOUNTER — Encounter (HOSPITAL_COMMUNITY): Payer: Self-pay | Admitting: *Deleted

## 2012-04-14 ENCOUNTER — Encounter (HOSPITAL_COMMUNITY)
Admission: RE | Disposition: A | Discharge status: Home / Self Care | Payer: Self-pay | Source: Ambulatory Visit | Attending: Internal Medicine

## 2012-04-14 ENCOUNTER — Ambulatory Visit (HOSPITAL_COMMUNITY)
Admission: RE | Admit: 2012-04-14 | Discharge: 2012-04-14 | Disposition: A | Discharge status: Home / Self Care | Payer: Medicare Other | Source: Ambulatory Visit | Attending: Internal Medicine | Admitting: Internal Medicine

## 2012-04-14 DIAGNOSIS — K2940 Chronic atrophic gastritis without bleeding: Secondary | ICD-10-CM | POA: Insufficient documentation

## 2012-04-14 DIAGNOSIS — R109 Unspecified abdominal pain: Secondary | ICD-10-CM | POA: Insufficient documentation

## 2012-04-14 DIAGNOSIS — R634 Abnormal weight loss: Secondary | ICD-10-CM

## 2012-04-14 DIAGNOSIS — K5730 Diverticulosis of large intestine without perforation or abscess without bleeding: Secondary | ICD-10-CM

## 2012-04-14 DIAGNOSIS — D126 Benign neoplasm of colon, unspecified: Secondary | ICD-10-CM

## 2012-04-14 DIAGNOSIS — K2960 Other gastritis without bleeding: Secondary | ICD-10-CM

## 2012-04-14 DIAGNOSIS — R197 Diarrhea, unspecified: Secondary | ICD-10-CM | POA: Insufficient documentation

## 2012-04-14 DIAGNOSIS — R110 Nausea: Secondary | ICD-10-CM

## 2012-04-14 HISTORY — PX: COLONOSCOPY WITH ESOPHAGOGASTRODUODENOSCOPY (EGD): SHX5779

## 2012-04-14 LAB — GLUCOSE, CAPILLARY: Glucose-Capillary: 96 mg/dL (ref 70–99)

## 2012-04-14 SURGERY — COLONOSCOPY WITH ESOPHAGOGASTRODUODENOSCOPY (EGD)
Anesthesia: Moderate Sedation

## 2012-04-14 MED ORDER — MEPERIDINE HCL 100 MG/ML IJ SOLN
INTRAMUSCULAR | Status: DC | PRN
  Administered 2012-04-14 (×2): 50 mg via INTRAVENOUS

## 2012-04-14 MED ORDER — MIDAZOLAM HCL 5 MG/5ML IJ SOLN
INTRAMUSCULAR | Status: AC
  Filled 2012-04-14: qty 10

## 2012-04-14 MED ORDER — BUTAMBEN-TETRACAINE-BENZOCAINE 2-2-14 % EX AERO
INHALATION_SPRAY | CUTANEOUS | Status: DC | PRN
  Administered 2012-04-14: 2 via TOPICAL

## 2012-04-14 MED ORDER — STERILE WATER FOR IRRIGATION IR SOLN
Status: DC | PRN
  Administered 2012-04-14: 10:00:00

## 2012-04-14 MED ORDER — PROMETHAZINE HCL 25 MG/ML IJ SOLN
INTRAMUSCULAR | Status: AC
  Filled 2012-04-14: qty 1

## 2012-04-14 MED ORDER — MIDAZOLAM HCL 5 MG/5ML IJ SOLN
INTRAMUSCULAR | Status: DC | PRN
  Administered 2012-04-14: 1 mg via INTRAVENOUS
  Administered 2012-04-14: 2 mg via INTRAVENOUS
  Administered 2012-04-14: 1 mg via INTRAVENOUS
  Administered 2012-04-14: 2 mg via INTRAVENOUS

## 2012-04-14 MED ORDER — PROMETHAZINE HCL 25 MG/ML IJ SOLN
25.0000 mg | Freq: Once | INTRAMUSCULAR | Status: AC
  Administered 2012-04-14: 25 mg via INTRAVENOUS

## 2012-04-14 MED ORDER — SODIUM CHLORIDE 0.45 % IV SOLN
INTRAVENOUS | Status: DC
  Administered 2012-04-14: 1000 mL via INTRAVENOUS

## 2012-04-14 MED ORDER — MEPERIDINE HCL 100 MG/ML IJ SOLN
INTRAMUSCULAR | Status: AC
  Filled 2012-04-14: qty 1

## 2012-04-14 NOTE — Op Note (Signed)
[facility] [address] [place], [address]   COLONOSCOPY PROCEDURE REPORT  PATIENT: Misty, [name]  MR#:         [id number] BIRTHDATE: 01-Feb-1946 , 66  yrs. old GENDER: Female ENDOSCOPIST: R.  Roetta Sessions, MD FACP FACG REFERRED BY:  Quintin Alto, M.D.  Dr. Grace Isaac PROCEDURE DATE:  04/14/2012 PROCEDURE:     Ileocolonoscopy with segmental biopsy and polyp biopsy  INDICATIONS: Chronic diarrhea; weight loss  INFORMED CONSENT:  The risks, benefits, alternatives and imponderables including but not limited to bleeding, perforation as well as the possibility of a missed lesion have been reviewed.  The potential for biopsy, lesion removal, etc. have also been discussed.  Questions have been answered.  All parties agreeable. Please see the history and physical in the medical record for more information.  MEDICATIONS: Versed 6 mg IV and Demerol 100 mg IV in divided doses.  DESCRIPTION OF PROCEDURE:  After a digital rectal exam was performed, the Pentax Colonoscope [id number]  colonoscope was advanced from the anus through the rectum and colon to the area of the cecum, ileocecal valve and appendiceal orifice.  The cecum was deeply intubated.  These structures were well-seen and photographed for the record.  From the level of the cecum and ileocecal valve, the scope was slowly and cautiously withdrawn.  The mucosal surfaces were carefully surveyed utilizing scope tip deflection to facilitate fold flattening as needed.  The scope was pulled down into the rectum where a thorough examination including retroflexion was performed.    FINDINGS:  Adequate preparation. Normal rectum. Left-sided and transverse diverticula; (2) diminutive polyps-one at the splenic flexure and one in the mid sigmoid segment appear; the remainder of the colonic mucosa appeared normal. The distal 5 cm of terminal ileal mucosa appeared normal.  THERAPEUTIC / DIAGNOSTIC MANEUVERS PERFORMED:   The 2 above-mentioned polyps were cold biopsied/removed. Subsequently, segment biopsies of the descending and descending segments were taken to evaluate for microscopic colitis.  COMPLICATIONS: None  CECAL WITHDRAWAL TIME:  11 minutes  IMPRESSION:  Colonic diverticulosis. Colonic polyps-removed as described above. Status post segmental biopsy  RECOMMENDATIONS: Followup on path pathology.  See EGD the report . I continue to suspect that mesenteric ischemia is largely responsible for her ongoing GI symptoms and weight loss.   _______________________________ eSigned:  R. Roetta Sessions, MD FACP [facility] 04/14/2012 11:12 AM   CC:

## 2012-04-14 NOTE — H&P (View-Only) (Signed)
Quick Note:  See CT report NW:[id number] E, MD  ______

## 2012-04-14 NOTE — Interval H&P Note (Signed)
History and Physical Interval Note:  04/14/2012 10:21 AM  [name]  has presented today for surgery, with the diagnosis of Weight Loss, Nausea, diarrhea, abd pain  The various methods of treatment have been discussed with the patient and family. After consideration of risks, benefits and other options for treatment, the patient has consented to  Procedure(s) (LRB) with comments: COLONOSCOPY WITH ESOPHAGOGASTRODUODENOSCOPY (EGD) (N/A) - 10:30 as a surgical intervention .  The patient's history has been reviewed, patient examined, no change in status, stable for surgery.  I have reviewed the patient's chart and labs.  Questions were answered to the patient's satisfaction.     Eula Listen  As above. EGD and colonoscopy per plan.The risks, benefits, limitations, imponderables and alternatives regarding both EGD and colonoscopy have been reviewed with the patient. Questions have been answered. All parties agreeable.

## 2012-04-14 NOTE — Op Note (Signed)
[facility] [address] [place], [address]   ENDOSCOPY PROCEDURE REPORT  PATIENT: [name], [name]  MR#: [id number] BIRTHDATE: Feb 28, 1946 , 66  yrs. old GENDER: Female ENDOSCOPIST: R.  Roetta Sessions, MD FACP FACG REFERRED BY:  Quintin Alto, M.D. Dr. Grace [facility] Radiology PROCEDURE DATE:  04/14/2012 PROCEDURE:     EGD with gastric and duodenal biopsy  INDICATIONS:    postprandial abdominal pain, weight loss, diarrhea; history of peripheral arterial disease including mesenteric vascular disease  INFORMED CONSENT:   The risks, benefits, limitations, alternatives and imponderables have been discussed.  The potential for biopsy, esophogeal dilation, etc. have also been reviewed.  Questions have been answered.  All parties agreeable.  Please see the history and physical in the medical record for more information.  MEDICATIONS:  Versed 4 mg IV and Demerol 100 mg IV in divided doses. Cetacaine spray.  DESCRIPTION OF PROCEDURE:   The EG-[id number] ([id number])  endoscope was introduced through the mouth and advanced to the second portion of the duodenum without difficulty or limitations.  The mucosal surfaces were surveyed very carefully during advancement of the scope and upon withdrawal.  Retroflexion view of the proximal stomach and esophagogastric junction was performed.      FINDINGS: Normal appearing tubular esophagus. Stomach empty. Few antral erosions; otherwise the remainder of the gastric mucosa appeared normal. Patent pylorus. Abnormal-appearing first, second and third portion of the duodenum.  THERAPEUTIC / DIAGNOSTIC MANEUVERS PERFORMED:  Biopsies of the abnormal gastric antrum and duodenal mucosa taken for histologic study   COMPLICATIONS:  None  IMPRESSION:  Gastric erosions of doubtful clinical significance  - status post biopsy. Status post biopsy of duodenum.  RECOMMENDATIONS:    See colonoscopy report. Followup on  pathology.    _______________________________ R. Roetta Sessions, MD FACP [facility] eSigned:  R. Roetta Sessions, MD FACP [facility] 04/14/2012 10:43 AM     CC:

## 2012-04-16 ENCOUNTER — Encounter: Payer: Self-pay | Admitting: Internal Medicine

## 2012-04-16 ENCOUNTER — Other Ambulatory Visit: Payer: Self-pay | Admitting: Interventional Radiology

## 2012-04-16 DIAGNOSIS — K559 Vascular disorder of intestine, unspecified: Secondary | ICD-10-CM

## 2012-04-17 ENCOUNTER — Telehealth: Payer: Self-pay | Admitting: *Deleted

## 2012-04-17 ENCOUNTER — Encounter: Payer: Self-pay | Admitting: *Deleted

## 2012-04-17 ENCOUNTER — Encounter (HOSPITAL_COMMUNITY): Payer: Self-pay | Admitting: Internal Medicine

## 2012-04-17 ENCOUNTER — Ambulatory Visit
Admission: RE | Admit: 2012-04-17 | Discharge: 2012-04-17 | Disposition: A | Discharge status: Home / Self Care | Payer: Medicare Other | Source: Ambulatory Visit | Attending: Interventional Radiology | Admitting: Interventional Radiology

## 2012-04-17 DIAGNOSIS — K559 Vascular disorder of intestine, unspecified: Secondary | ICD-10-CM

## 2012-04-17 NOTE — Telephone Encounter (Signed)
Wants to know if patient can hold plavix starting tomorrow morning for procedure scheduled on 04/21/12?

## 2012-04-18 ENCOUNTER — Other Ambulatory Visit (HOSPITAL_COMMUNITY): Payer: Self-pay | Admitting: Interventional Radiology

## 2012-04-18 ENCOUNTER — Telehealth: Payer: Self-pay | Admitting: Emergency Medicine

## 2012-04-18 NOTE — Telephone Encounter (Signed)
CALLED PT TO MAKE HER AWARE THAT CARDIO OFFICE SAID OK TO STOP PLAVIX PRIOR TO PROCEDURE AND THAT JENNIFER W/ [facility] WILL CONTACT HER WITH APPT. AND ALL INFO.  WE PLAN ON CHANGING THE DATE OF PROCEDURE TO 04-24-12.

## 2012-04-18 NOTE — Telephone Encounter (Signed)
Yes, then she is to resume at previous dose

## 2012-04-18 NOTE — Telephone Encounter (Signed)
Tammy at [facility] informed.

## 2012-04-21 ENCOUNTER — Other Ambulatory Visit (HOSPITAL_COMMUNITY): Payer: Self-pay | Admitting: Interventional Radiology

## 2012-04-21 ENCOUNTER — Other Ambulatory Visit: Payer: Self-pay | Admitting: Radiology

## 2012-04-21 DIAGNOSIS — K559 Vascular disorder of intestine, unspecified: Secondary | ICD-10-CM

## 2012-04-22 ENCOUNTER — Other Ambulatory Visit: Payer: Self-pay | Admitting: Physician Assistant

## 2012-04-24 ENCOUNTER — Encounter (HOSPITAL_COMMUNITY): Payer: Self-pay

## 2012-04-24 ENCOUNTER — Ambulatory Visit (HOSPITAL_COMMUNITY)
Admission: RE | Admit: 2012-04-24 | Discharge: 2012-04-24 | Disposition: A | Discharge status: Home / Self Care | Payer: Medicare Other | Source: Ambulatory Visit | Attending: Interventional Radiology | Admitting: Interventional Radiology

## 2012-04-24 ENCOUNTER — Other Ambulatory Visit (HOSPITAL_COMMUNITY): Payer: Self-pay | Admitting: Interventional Radiology

## 2012-04-24 DIAGNOSIS — I10 Essential (primary) hypertension: Secondary | ICD-10-CM | POA: Insufficient documentation

## 2012-04-24 DIAGNOSIS — I708 Atherosclerosis of other arteries: Secondary | ICD-10-CM | POA: Insufficient documentation

## 2012-04-24 DIAGNOSIS — R197 Diarrhea, unspecified: Secondary | ICD-10-CM | POA: Insufficient documentation

## 2012-04-24 DIAGNOSIS — K559 Vascular disorder of intestine, unspecified: Secondary | ICD-10-CM

## 2012-04-24 DIAGNOSIS — R634 Abnormal weight loss: Secondary | ICD-10-CM | POA: Insufficient documentation

## 2012-04-24 DIAGNOSIS — E119 Type 2 diabetes mellitus without complications: Secondary | ICD-10-CM | POA: Insufficient documentation

## 2012-04-24 DIAGNOSIS — R109 Unspecified abdominal pain: Secondary | ICD-10-CM | POA: Insufficient documentation

## 2012-04-24 LAB — CBC WITH DIFFERENTIAL/PLATELET
Basophils Absolute: 0 10*3/uL (ref 0.0–0.1)
HCT: 41.6 % (ref 36.0–46.0)
Lymphocytes Relative: 29 % (ref 12–46)
Lymphs Abs: 2.3 10*3/uL (ref 0.7–4.0)
Monocytes Absolute: 0.9 10*3/uL (ref 0.1–1.0)
Neutro Abs: 4.6 10*3/uL (ref 1.7–7.7)
Platelets: 158 10*3/uL (ref 150–400)
RBC: 4.57 MIL/uL (ref 3.87–5.11)
RDW: 13 % (ref 11.5–15.5)
WBC: 7.8 10*3/uL (ref 4.0–10.5)

## 2012-04-24 LAB — BASIC METABOLIC PANEL
Chloride: 101 mEq/L (ref 96–112)
Creatinine, Ser: 0.56 mg/dL (ref 0.50–1.10)
GFR calc Af Amer: >90 mL/min (ref >90)
Sodium: 145 mEq/L (ref 135–145)

## 2012-04-24 LAB — APTT: aPTT: 34 seconds (ref 24–37)

## 2012-04-24 LAB — PROTIME-INR: Prothrombin Time: 13.1 seconds (ref 11.6–15.2)

## 2012-04-24 LAB — GLUCOSE, CAPILLARY: Glucose-Capillary: 49 mg/dL — ABNORMAL LOW (ref 70–99)

## 2012-04-24 MED ORDER — IOHEXOL 300 MG/ML  SOLN
150.0000 mL | Freq: Once | INTRAMUSCULAR | Status: AC | PRN
  Administered 2012-04-24: 150 mL via INTRAVENOUS

## 2012-04-24 MED ORDER — HEPARIN SODIUM (PORCINE) 1000 UNIT/ML IJ SOLN
INTRAMUSCULAR | Status: AC
  Filled 2012-04-24: qty 1

## 2012-04-24 MED ORDER — FENTANYL CITRATE 0.05 MG/ML IJ SOLN
INTRAMUSCULAR | Status: AC
  Filled 2012-04-24: qty 4

## 2012-04-24 MED ORDER — DEXTROSE 50 % IV SOLN
50.0000 mL | Freq: Once | INTRAVENOUS | Status: AC
  Administered 2012-04-24: 50 mL via INTRAVENOUS

## 2012-04-24 MED ORDER — MIDAZOLAM HCL 2 MG/2ML IJ SOLN
INTRAMUSCULAR | Status: AC | PRN
  Administered 2012-04-24 (×2): 1 mg via INTRAVENOUS

## 2012-04-24 MED ORDER — POTASSIUM CHLORIDE 10 MEQ/100ML IV SOLN
10.0000 meq | Freq: Once | INTRAVENOUS | Status: DC
  Filled 2012-04-24: qty 100

## 2012-04-24 MED ORDER — POTASSIUM CHLORIDE 10 MEQ/100ML IV SOLN
10.0000 meq | Freq: Once | INTRAVENOUS | Status: AC
  Administered 2012-04-24: 10 meq via INTRAVENOUS
  Filled 2012-04-24: qty 100

## 2012-04-24 MED ORDER — FENTANYL CITRATE 0.05 MG/ML IJ SOLN
INTRAMUSCULAR | Status: AC | PRN
  Administered 2012-04-24 (×2): 50 ug via INTRAVENOUS

## 2012-04-24 MED ORDER — DEXTROSE 50 % IV SOLN
INTRAVENOUS | Status: AC
  Administered 2012-04-24: 50 mL via INTRAVENOUS
  Filled 2012-04-24: qty 50

## 2012-04-24 MED ORDER — SODIUM CHLORIDE 0.9 % IV SOLN
Freq: Once | INTRAVENOUS | Status: AC
  Administered 2012-04-24: 08:00:00 via INTRAVENOUS

## 2012-04-24 MED ORDER — MIDAZOLAM HCL 2 MG/2ML IJ SOLN
INTRAMUSCULAR | Status: AC
  Filled 2012-04-24: qty 4

## 2012-04-24 MED ORDER — POTASSIUM CHLORIDE CRYS ER 20 MEQ PO TBCR
20.0000 meq | EXTENDED_RELEASE_TABLET | Freq: Once | ORAL | Status: AC
  Administered 2012-04-24: 20 meq via ORAL
  Filled 2012-04-24: qty 1

## 2012-04-24 NOTE — Procedures (Signed)
Mesenteric arteriogram failed to demonstrate a patent SMA.  As such, no intervention was performed. Hemostasis was achieved at the left groin access site with deployment of an Exoseal closure device.  No immediate post procedural complications.

## 2012-04-24 NOTE — ED Notes (Signed)
Requested bed from short stay; spoke with Janice RN 

## 2012-04-24 NOTE — H&P (Signed)
[name] is an 66 y.o. female.   Chief Complaint: Abd pain; diarrhea Recent GI work up neg Previous bout mesenteric ischemia 2007 CAD/CABG 1995 Continued smoker 04/04/12 CTA shows mesenteric stenosis Scheduled for mesenteric arteriogram with possible angioplasty/stent placement HPI: DM; obese; smoker; HLD; HTN; PVCs; CAD/CABG  Past Medical History  Diagnosis Date  . Mitral regurgitation     Mild to moderate  . Type 2 diabetes mellitus   . Obesity   . PAD (peripheral artery disease)   . Acute ischemic colitis 09/11/2005  . Coronary atherosclerosis of native coronary artery     Mulitvessel LVEF 50-50%, DES circ 1/11, occluded SVG to OM and SVG to diagonal , LVEF 60%  . Mixed hyperlipidemia   . Essential hypertension, benign   . Orthostatic hypotension   . Anxiety   . Depression   . Hypothyroidism   . PVC's (premature ventricular contractions)     Past Surgical History  Procedure Date  . Neck surgery     Cervical laminectomy  . Carpal tunnel release     Bilateral  . Partial hysterectomy   . Back surgery     Lumbar spine surgery  . Coronary artery bypass graft     LIMA-LAD; SVG-OM; SVG-DX in 1995 [facility]  . Carotid endarectomy     Left  . Colonoscopy 09/14/2005    Dr. Jacqlyn Larsen colitis  . Colonoscopy with esophagogastroduodenoscopy (egd) 04/14/2012    Procedure: COLONOSCOPY WITH ESOPHAGOGASTRODUODENOSCOPY (EGD);  Surgeon: Corbin Ade, MD;  Location: [facility] END[facility] SUITE;  Service: Endoscopy;  Laterality: N/A;  10:30    Family History  Problem Relation Age of Onset  . Coronary artery disease    . Hypertension    . Crohn's disease Sister 48   Social History:  reports that she has been smoking Cigarettes.  She has a 40 pack-year smoking history. She has never used smokeless tobacco. She reports that she drinks alcohol. She reports that she does not use illicit drugs.  Allergies: No Known Allergies   (Not in a hospital admission)  Results for orders placed  during the hospital encounter of 04/24/12 (from the past 48 hour(s))  GLUCOSE, CAPILLARY     Status: Abnormal   Collection Time   04/24/12  7:21 AM      Component Value Range Comment   Glucose-Capillary 49 (*) 70 - 99 mg/dL    No results found.  Review of Systems  Constitutional: Negative for fever and chills.  Respiratory: Positive for shortness of breath.   Cardiovascular: Negative for chest pain.  Gastrointestinal: Positive for nausea, abdominal pain and diarrhea.  Musculoskeletal: Negative for back pain.  Neurological: Negative for headaches.  Psychiatric/Behavioral: The patient is nervous/anxious.     Blood pressure 116/62, pulse 55, temperature 97.6 F (36.4 C), temperature source Oral, resp. rate 18, height 5\' 4"  (1.626 m), weight 228 lb (103.42 kg), SpO2 94.00%. Physical Exam  Constitutional: She is oriented to person, place, and time. She appears well-developed.  Cardiovascular: Normal rate, regular rhythm and normal heart sounds.   Respiratory: Effort normal. She has wheezes.  GI: Soft. Bowel sounds are normal. There is tenderness.  Neurological: She is alert and oriented to person, place, and time.  Psychiatric: She has a normal mood and affect. Her behavior is normal. Judgment and thought content normal.     Assessment/Plan Abd pain; diarrhea Hx mesenteric ischemia 04/04/12 CTA reveals stenosis Scheduled for mesenteric arteriogram with poss intervention Pt and dtr aware of procedure benefits and risks and  agreeable to proceed Consent signed and in chart  Laural Eiland A 04/24/2012, 7:44 AM

## 2012-04-24 NOTE — ED Notes (Signed)
Patient denies pain and is resting comfortably.  

## 2012-04-24 NOTE — ED Notes (Signed)
Patient identified. Scanner broken

## 2012-05-02 ENCOUNTER — Telehealth: Payer: Self-pay | Admitting: Emergency Medicine

## 2012-05-02 NOTE — Telephone Encounter (Signed)
LM TO SEE IF PT WANTS TO R/S HER PROCEDURE (POSSIBLE STENTING) AT [facility] IN THE BI-PLANE ROOM. TOLD PT TO CALL ME BACK IF INTERESTED.   10:05AM- PT CALLED ME RIGHT BACK- WOULD LIKE TO GET SET UP AGAIN FOR THE PROCEDURE, HOWEVER, SHE IS STILL VERY TENDER AND BRUISED FROM THE ATTEMPT LAST WEEK.  TOLD HER WE CAN PUSH HER OUT A FEW WEEKS UNTIL SHE HEALS. WILL NOTIFY JENNIFER AT [facility] TO CONTACT PT TO SET UP PROCEDURE.

## 2012-05-02 NOTE — Telephone Encounter (Signed)
LMOVM FOR JENNIFER TO R/S PT FOR POSSIBLE MESENTERIC STENTING IN THE BI-PLANE ROOM AND ON A DAY WHEN ANOTHER IR DOC IS AVAILABLE IN THE V2 SLOT.- CAN ALSO WAIT A COUPLE OF WEEKS UNTIL PT HAS HEALED FROM PREVIOUS ATTEMPT.

## 2012-05-20 ENCOUNTER — Other Ambulatory Visit: Payer: Self-pay | Admitting: Interventional Radiology

## 2012-05-20 DIAGNOSIS — K559 Vascular disorder of intestine, unspecified: Secondary | ICD-10-CM

## 2012-05-22 ENCOUNTER — Other Ambulatory Visit: Payer: Self-pay | Admitting: Interventional Radiology

## 2012-05-22 ENCOUNTER — Ambulatory Visit
Admission: RE | Admit: 2012-05-22 | Discharge: 2012-05-22 | Disposition: A | Discharge status: Home / Self Care | Payer: Medicare Other | Source: Ambulatory Visit | Attending: Interventional Radiology | Admitting: Interventional Radiology

## 2012-05-22 DIAGNOSIS — K559 Vascular disorder of intestine, unspecified: Secondary | ICD-10-CM

## 2012-05-22 DIAGNOSIS — S301XXA Contusion of abdominal wall, initial encounter: Secondary | ICD-10-CM

## 2012-06-10 ENCOUNTER — Telehealth: Payer: Self-pay | Admitting: Radiology

## 2012-06-10 NOTE — Telephone Encounter (Signed)
Phone call to check status of Left groin hematoma.  Left message requesting patient call w/ update.

## 2012-06-11 ENCOUNTER — Telehealth: Payer: Self-pay | Admitting: Emergency Medicine

## 2012-06-11 NOTE — Telephone Encounter (Signed)
PT CALLED [place] BACK TO GIVE [place] A STATUS UPDATE ON HER LT GROIN HEMATOMA.  HER LT GROIN HEMATOMA IS ABOUT THE SAME AND THE SIZE OF A "GOLFBALL".  THE BRUISING HAS GONE AWAY.  SHE IS EXPERIENCING PAIN AND DIARRHEA AFTER SHE EATS. TOLD HER TO CALL IF SHE BECOMES WORSE.

## 2012-06-16 ENCOUNTER — Other Ambulatory Visit (HOSPITAL_COMMUNITY): Payer: Self-pay | Admitting: Interventional Radiology

## 2012-06-16 DIAGNOSIS — K559 Vascular disorder of intestine, unspecified: Secondary | ICD-10-CM

## 2012-06-16 DIAGNOSIS — R1909 Other intra-abdominal and pelvic swelling, mass and lump: Secondary | ICD-10-CM

## 2012-06-16 DIAGNOSIS — R102 Pelvic and perineal pain: Secondary | ICD-10-CM

## 2012-06-17 ENCOUNTER — Ambulatory Visit
Admission: RE | Admit: 2012-06-17 | Discharge: 2012-06-17 | Disposition: A | Discharge status: Home / Self Care | Payer: Medicare Other | Source: Ambulatory Visit | Attending: Interventional Radiology | Admitting: Interventional Radiology

## 2012-06-17 ENCOUNTER — Other Ambulatory Visit (HOSPITAL_COMMUNITY): Payer: Self-pay | Admitting: Interventional Radiology

## 2012-06-17 DIAGNOSIS — R1909 Other intra-abdominal and pelvic swelling, mass and lump: Secondary | ICD-10-CM

## 2012-06-17 DIAGNOSIS — K559 Vascular disorder of intestine, unspecified: Secondary | ICD-10-CM

## 2012-06-17 DIAGNOSIS — R102 Pelvic and perineal pain: Secondary | ICD-10-CM

## 2012-06-30 ENCOUNTER — Telehealth: Payer: Self-pay | Admitting: Urgent Care

## 2012-06-30 NOTE — Telephone Encounter (Signed)
Please make FU appt Dr Jena Gauss next available ZO:[id number], mesenteric ischemia. Thanks

## 2012-07-01 ENCOUNTER — Encounter: Payer: Self-pay | Admitting: Internal Medicine

## 2012-07-01 NOTE — Telephone Encounter (Signed)
Pt is aware of OV on 3/26 at 0830 with RMR and appt card was mailed

## 2012-07-23 ENCOUNTER — Ambulatory Visit (INDEPENDENT_AMBULATORY_CARE_PROVIDER_SITE_OTHER): Payer: Medicare Other | Admitting: Internal Medicine

## 2012-07-23 ENCOUNTER — Encounter: Payer: Self-pay | Admitting: Internal Medicine

## 2012-07-23 VITALS — BP 135/65 | HR 68 | Temp 97.4°F | Ht 64.0 in | Wt 226.0 lb

## 2012-07-23 DIAGNOSIS — K551 Chronic vascular disorders of intestine: Secondary | ICD-10-CM

## 2012-07-23 NOTE — Progress Notes (Signed)
Primary Care Physician:  Juliette Alcide, MD Primary Gastroenterologist:  Dr. Jena Gauss  Pre-Procedure History & Physical: HPI:  [name] is a 67 y.o. female here for followup of continued weight loss postprandial abdominal pain in the setting of known mesenteric arterial stenosis. She continues to have postprandial bandlike upper abdominal pain radiating into her back primarily after she eats. She's felt to have a critical stenosis involving her SMA. Dorsally, interventional radiology in [place] was unable to fully characterize this area or intervene at the time of prior mesenteric angiography earlier this year. Procedure complicated by significant left inguinal hematoma. The patient has now lost approximately 51 pounds over the past 15 months or so likely related to her aversion to eating because of postprandial abdominal pain. She has not yet seen a vascular surgeon. She really does not have any typical reflux symptoms this time and is not on any acid suppression therapy. Moreover, she has intermittent diarrhea and these symptoms have settled down significantly. Recent EGD and ileocolonoscopy failed to reveal any other cause for her abdominal pain.  Past Medical History  Diagnosis Date  . Mitral regurgitation     Mild to moderate  . Type 2 diabetes mellitus   . Obesity   . PAD (peripheral artery disease)   . Acute ischemic colitis 09/11/2005  . Coronary atherosclerosis of native coronary artery     Mulitvessel LVEF 50-50%, DES circ 1/11, occluded SVG to OM and SVG to diagonal , LVEF 60%  . Mixed hyperlipidemia   . Essential hypertension, benign   . Orthostatic hypotension   . Anxiety   . Depression   . Hypothyroidism   . PVC's (premature ventricular contractions)   . Tubular adenoma   . Diverticulosis of colon   . Atherosclerotic vascular disease     calcified plaque at the origin of the celiac and SMA    Past Surgical History  Procedure Laterality Date  . Neck surgery       Cervical laminectomy  . Carpal tunnel release      Bilateral  . Partial hysterectomy    . Back surgery      Lumbar spine surgery  . Coronary artery bypass graft      LIMA-LAD; SVG-OM; SVG-DX in 1995 [facility]  . Carotid endarectomy      Left  . Colonoscopy  09/14/2005    Dr. Jacqlyn Larsen colitis  . Colonoscopy with esophagogastroduodenoscopy (egd)  04/14/2012    Dr. Jena Gauss- EGD= gastric erosions of doubtful clinical significance per bx- chronic inactive gastritis, benign small bowel mucosa. TCS=colonic diverticulosis, tubular adenoma    Prior to Admission medications   Medication Sig Start Date End Date Taking? Authorizing Provider  alprazolam Prudy Feeler) 2 MG tablet Take 2 mg by mouth at bedtime as needed. Anxiety.   Yes Historical Provider, MD  amLODipine (NORVASC) 5 MG tablet Take 5 mg by mouth daily.     Yes Historical Provider, MD  aspirin 81 MG tablet Take 81 mg by mouth daily.     Yes Historical Provider, MD  clopidogrel (PLAVIX) 75 MG tablet Take 75 mg by mouth daily.     Yes Historical Provider, MD  DULoxetine (CYMBALTA) 60 MG capsule Take 60 mg by mouth daily.   Yes Historical Provider, MD  fluvoxaMINE (LUVOX) 50 MG tablet Take 50 mg by mouth at bedtime.   Yes Historical Provider, MD  furosemide (LASIX) 40 MG tablet Take 40 mg by mouth daily.    Yes Historical Provider, MD  gabapentin (NEURONTIN) 400  MG capsule Take 400 mg by mouth 3 (three) times daily.   Yes Historical Provider, MD  glyBURIDE (DIABETA) 5 MG tablet Take 10 mg by mouth 2 (two) times daily.    Yes Historical Provider, MD  hydrOXYzine (ATARAX/VISTARIL) 25 MG tablet Take 25 mg by mouth 3 (three) times daily as needed. Ocassionally for allergies   Yes Historical Provider, MD  isosorbide mononitrate (IMDUR) 60 MG 24 hr tablet Take 1 tablet (60 mg total) by mouth daily. 10/22/11  Yes Jonelle Sidle, MD  metFORMIN (GLUCOPHAGE) 500 MG tablet Take 1,000 mg by mouth 2 (two) times daily with a meal.    Yes Historical Provider,  MD  metoprolol (LOPRESSOR) 50 MG tablet Take 50 mg by mouth daily.    Yes Historical Provider, MD  nitroGLYCERIN (NITROSTAT) 0.4 MG SL tablet Place 1 tablet (0.4 mg total) under the tongue every 5 (five) minutes as needed. 02/16/11  Yes Jonelle Sidle, MD  oxyCODONE-acetaminophen (PERCOCET) 7.5-325 MG per tablet Take 1 tablet by mouth every 8 (eight) hours as needed. Pain.   Yes Historical Provider, MD  polyethylene glycol powder (GLYCOLAX/MIRALAX) powder Take 17 g by mouth daily.   Yes Historical Provider, MD  potassium chloride (K-DUR) 10 MEQ tablet Take 10 mEq by mouth daily.   Yes Historical Provider, MD  pravastatin (PRAVACHOL) 80 MG tablet Take 80 mg by mouth daily.     Yes Historical Provider, MD    Allergies as of 07/23/2012  . (No Known Allergies)    Family History  Problem Relation Age of Onset  . Coronary artery disease    . Hypertension    . Crohn's disease Sister 25    History   Social History  . Marital Status: Widowed    Spouse Name: N/A    Number of Children: 4  . Years of Education: N/A   Occupational History  . DISABLED    Social History Main Topics  . Smoking status: Current Every Day Smoker -- 0.80 packs/day for 50 years    Types: Cigarettes  . Smokeless tobacco: Never Used  . Alcohol Use: Yes     Comment: rare etoh 3-4 drinks per yr, hx beer heavily for 10-12 yrs, quit heavy etoh 1990  . Drug Use: No  . Sexually Active: Not on file   Other Topics Concern  . Not on file   Social History Narrative   LIves alone    Review of Systems: See HPI, otherwise negative ROS  Physical Exam: BP 135/65  Pulse 68  Temp(Src) 97.4 F (36.3 C) (Oral)  Ht 5\' 4"  (1.626 m)  Wt 226 lb (102.513 kg)  BMI 38.77 kg/m2 General:   Alert,  still obese, chronically ill-appearing lady. Resting comfortably in no acute distress  Skin:  Intact without significant lesions or rashes. Eyes:  Sclera clear, no icterus.   Conjunctiva pink. Ears:  Normal auditory  acuity. Nose:  No deformity, discharge,  or lesions. Mouth:  No deformity or lesions. Neck:  Supple; no masses or thyromegaly. No significant cervical adenopathy. Lungs:  Clear throughout to auscultation.   No wheezes, crackles, or rhonchi. No acute distress. Heart:  Regular rate and rhythm; no murmurs, clicks, rubs,  or gallops. Abdomen: Obese. Positive bowel sounds. I do not detect any bruits. Abdomen is soft and nontender she has a small soft tissue mass in the left extreme left lower quadrant which is slightly tender and may be the remnant of her recent hematoma.  Soft and nontender without  appreciable mass or hepatosplenomegaly.  Pulses:  Normal pulses noted. Extremities:  Without clubbing or edema.  Impression/Plan:  Pleasant 67 year old lady with peripheral arterial disease, postprandial abdominal pain in what is felt to be related to significant SMA stenosis which may be producing mesenteric ischemia. Failed attempt at percutaneous intervention as outlined above. Patient's symptoms are ongoing. She has continued to lose weight. She likely has a background element of GERD and irritable bowel syndrome as well but I am most concerned that she does have critical arterial stenosis producing postprandial mesenteric ischemia.  I feel would be in this lady's best interest of visit the vascular surgeon for consultation regarding management options. To this end, we'll take the liberty to have her see Dr. Tawanna Cooler Early down in [place]. In the interim, if she starts having severe abdominal pain, she should go immediately to the nearest emergency department. Further recommendations to follow once we hear back from the vascular surgeon.

## 2012-07-23 NOTE — Patient Instructions (Addendum)
Appointment with Dr. Arbie Cookey regarding surgical managmement of mesenteric ischemia  Go to the emergency room if you develop severe abdominal pain  Referral has been faxed over to Dr. Arbie Cookey and she has an appointment on 08/12/12 at 9:30 am

## 2012-08-11 ENCOUNTER — Encounter: Payer: Self-pay | Admitting: Vascular Surgery

## 2012-08-12 ENCOUNTER — Ambulatory Visit (INDEPENDENT_AMBULATORY_CARE_PROVIDER_SITE_OTHER): Payer: Medicare Other | Admitting: Vascular Surgery

## 2012-08-12 ENCOUNTER — Encounter: Payer: Self-pay | Admitting: Vascular Surgery

## 2012-08-12 ENCOUNTER — Other Ambulatory Visit: Payer: Medicare Other

## 2012-08-12 VITALS — BP 149/82 | HR 89 | Resp 18 | Ht 64.0 in | Wt 224.0 lb

## 2012-08-12 DIAGNOSIS — K551 Chronic vascular disorders of intestine: Secondary | ICD-10-CM

## 2012-08-12 NOTE — Progress Notes (Signed)
[facility] [place]   Patient name: [name] MRN: [id number] DOB: [birth year] Sex: female    Referred by: Jena Gauss  Reason for referral:  Chief Complaint  Patient presents with  . Mesenteric Insufficiency    NEW EVALUATION REFERRED BY DR Jena Gauss    HISTORY OF PRESENT ILLNESS: The patient is a very complex past history an unusual presentation. She is a 67 year old female who reports abdominal pain and weight loss. She is obese at 225 pounds over port a 50 pound weight loss over the past 15 months. Port. She reports that her abdominal pain affects her low back and also causes pain in both legs. She poor since when she eats she feels that her stomach and she doesn't eat she also feel sick to her stomach. She reports abdominal bloating at night. She reports that she stays in bed most of the time and sleeps all the time as well. Reports discomfort extending into her groins for she feels as though her bones are separated in her hips and has to walk with a walker on the occasion. She reports numbness and pins and needles sensation from her knees distally. It is difficult for me to get specific postprandial pain symptoms associated away from all of these other complaints. She does report some pain after eating but has pain continuously. He does have a history of colonic ischemic colitis in 2007. I reviewed her CT scan from 2007 compared to her recent CT scan in December of 2013. 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 There was some calcified plaque at the origin of the celiac and superior mesenteric artery but this is markedly progressed and her more recent study from December 2013. She has a significant past cardiac history. She underwent coronary bypass grafting 20 years ago when she was in her mid 56s. She's had extensive treatment since that time with arteriography and stenting in 2011. She was told that time that there were multiple areas that could not  be corrected. She has been told that she did not have back surgery due to limiting cardiac issues. Do not have access to her cardiac evaluation from 2011. She underwent arteriogram and transverse imaging and had unsuccessful imaging due to her obesity and difficulty in obtaining a lateral arteriogram. Radiologist's recommendation that time was to obtain a bifid by plain film at [facility]. He had a complication of hematoma in her left groin associated with his arteriogram. I have reviewed these films as well.  Past Medical History  Diagnosis Date  . Mitral regurgitation     Mild to moderate  . Type 2 diabetes mellitus   . Obesity   . PAD (peripheral artery disease)   . Acute ischemic colitis 09/11/2005  . Coronary atherosclerosis of native coronary artery     Mulitvessel LVEF 50-50%, DES circ 1/11, occluded SVG to OM and SVG to diagonal , LVEF 60%  . Mixed hyperlipidemia   . Essential hypertension, benign   . Orthostatic hypotension   . Anxiety   . Depression   . Hypothyroidism   . PVC's (premature ventricular contractions)   . Tubular adenoma   . Diverticulosis of colon   . Atherosclerotic vascular disease     calcified plaque at the origin of the celiac and SMA    Past Surgical History  Procedure Laterality Date  . Neck surgery      Cervical laminectomy  . Carpal tunnel release      Bilateral  .  Partial hysterectomy    . Back surgery      Lumbar spine surgery  . Coronary artery bypass graft      LIMA-LAD; SVG-OM; SVG-DX in 1995 [facility]  . Carotid endarectomy      Left  . Colonoscopy  09/14/2005    Dr. Jacqlyn Larsen colitis  . Colonoscopy with esophagogastroduodenoscopy (egd)  04/14/2012    Dr. Jena Gauss- EGD= gastric erosions of doubtful clinical significance per bx- chronic inactive gastritis, benign small bowel mucosa. TCS=colonic diverticulosis, tubular adenoma    History   Social History  . Marital Status: Widowed    Spouse Name: N/A    Number of Children: 4  .  Years of Education: N/A   Occupational History  . DISABLED    Social History Main Topics  . Smoking status: Current Every Day Smoker -- 0.80 packs/day for 50 years    Types: Cigarettes  . Smokeless tobacco: Never Used  . Alcohol Use: Yes     Comment: rare etoh 3-4 drinks per yr, hx beer heavily for 10-12 yrs, quit heavy etoh 1990  . Drug Use: No  . Sexually Active: Not on file   Other Topics Concern  . Not on file   Social History Narrative   LIves alone    Family History  Problem Relation Age of Onset  . Coronary artery disease    . Hypertension    . Crohn's disease Sister 83    Allergies as of 08/12/2012  . (No Known Allergies)    Current Outpatient Prescriptions on File Prior to Visit  Medication Sig Dispense Refill  . alprazolam (XANAX) 2 MG tablet Take 2 mg by mouth at bedtime as needed. Anxiety.      Marland Kitchen amLODipine (NORVASC) 5 MG tablet Take 5 mg by mouth daily.        Marland Kitchen aspirin 81 MG tablet Take 81 mg by mouth daily.        . clopidogrel (PLAVIX) 75 MG tablet Take 75 mg by mouth daily.        . DULoxetine (CYMBALTA) 60 MG capsule Take 60 mg by mouth daily.      . fluvoxaMINE (LUVOX) 50 MG tablet Take 50 mg by mouth at bedtime.      . furosemide (LASIX) 40 MG tablet Take 40 mg by mouth daily.       Marland Kitchen gabapentin (NEURONTIN) 400 MG capsule Take 400 mg by mouth 3 (three) times daily.      Marland Kitchen glyBURIDE (DIABETA) 5 MG tablet Take 10 mg by mouth 2 (two) times daily.       . hydrOXYzine (ATARAX/VISTARIL) 25 MG tablet Take 25 mg by mouth 3 (three) times daily as needed. Ocassionally for allergies      . isosorbide mononitrate (IMDUR) 60 MG 24 hr tablet Take 1 tablet (60 mg total) by mouth daily.  30 tablet  3  . metFORMIN (GLUCOPHAGE) 500 MG tablet Take 1,000 mg by mouth 2 (two) times daily with a meal.       . metoprolol (LOPRESSOR) 50 MG tablet Take 50 mg by mouth daily.       . nitroGLYCERIN (NITROSTAT) 0.4 MG SL tablet Place 1 tablet (0.4 mg total) under the tongue every  5 (five) minutes as needed.  90 tablet  3  . oxyCODONE-acetaminophen (PERCOCET) 7.5-325 MG per tablet Take 1 tablet by mouth every 8 (eight) hours as needed. Pain.      . polyethylene glycol powder (GLYCOLAX/MIRALAX) powder Take 17 g by mouth  daily.      . potassium chloride (K-DUR) 10 MEQ tablet Take 10 mEq by mouth daily.      . pravastatin (PRAVACHOL) 80 MG tablet Take 80 mg by mouth daily.         No current facility-administered medications on file prior to visit.     REVIEW OF SYSTEMS:  Positives indicated with an "X"  CARDIOVASCULAR:  [ ]  chest pain   [ ]  chest pressure   [ ]  palpitations   [ ]  orthopnea   [ ]  dyspnea on exertion   [ ]  claudication   [ ]  rest pain   [ ]  DVT   [ ]  phlebitis PULMONARY:   [ ]  productive cough   [ ]  asthma   [ ]  wheezing NEUROLOGIC:   [x ] weakness  [x ] paresthesias  [ ]  aphasia  [ ]  amaurosis  [x ] dizziness HEMATOLOGIC:   [ ]  bleeding problems   [ ]  clotting disorders MUSCULOSKELETAL:  [ ]  joint pain   [ ]  joint swelling GASTROINTESTINAL: [ ]   blood in stool  [ ]   hematemesis GENITOURINARY:  [ ]   dysuria  [ ]   hematuria PSYCHIATRIC:  [ ]  history of major depression INTEGUMENTARY:  [ ]  rashes  [ ]  ulcers CONSTITUTIONAL:  [x ] fever   [x ] chills  PHYSICAL EXAMINATION:  General: The patient is a well-nourished female, in no acute distress. Vital signs are BP 149/82  Pulse 89  Resp 18  Ht 5\' 4"  (1.626 m)  Wt 224 lb (101.606 kg)  BMI 38.43 kg/m2 Pulmonary: There is a good air exchange bilaterally without wheezing or rales. Abdomen: Soft and non-tender with normal pitch bowel sounds. No masses noted. Diffuse tenderness Musculoskeletal: There are no major deformities.  There is no significant extremity pain. Neurologic: No focal weakness or paresthesias are detected, Skin: There are no ulcer or rashes noted. Psychiatric: The patient has normal affect. Pulse status: 2+ radial and 1+ femoral pulses. Do not palpate pedal pulses Carotid arteries  without bruits bilaterally Cardiovascular: There is a regular rate and rhythm without significant murmur appreciated.  CT scan from December 2013 as compared to 2007 show progressive extensive calcification at the suture mesenteric artery and celiac artery origin. The patient is circumferentially calcified infrarenal aorta extending into her iliac arteries with iliac artery stenosis as well  Normal arteriogram was also reviewed showing iliac stenoses at the origin bilaterally and probable stenosis of the SMA and celiac arteries   Impression and Plan:  Complex history which may be related to mesenteric ischemia. Certainly the patient would be at extremely high risk for open aortic surgery related to her severe coronary disease. If this was contemplated she would have to have a cardiac evaluation beforehand. She has been deemed inoperable in the past but I explained this was for back surgery in this may be more pressing. The interventional radiologist had recommended repeating her arteriogram and attempted stenting with a biplane unit. I feel this is the most appropriate initial option due to her high risk for surgery. She would require replacement of her infrarenal aorta and bypass off of this. I would certainly think that initial endovascular treatment attempt should be attempted. He will notify interventional radiology this as well.    Naftula Donahue [facility] [place] Office: [phone]

## 2012-08-17 ENCOUNTER — Encounter: Payer: Self-pay | Admitting: Cardiology

## 2012-08-22 ENCOUNTER — Encounter: Payer: Self-pay | Admitting: Cardiology

## 2012-08-22 ENCOUNTER — Ambulatory Visit (INDEPENDENT_AMBULATORY_CARE_PROVIDER_SITE_OTHER): Payer: Medicare Other | Admitting: Cardiology

## 2012-08-22 VITALS — BP 171/84 | HR 63 | Ht 64.0 in | Wt 224.8 lb

## 2012-08-22 DIAGNOSIS — K551 Chronic vascular disorders of intestine: Secondary | ICD-10-CM

## 2012-08-22 DIAGNOSIS — E785 Hyperlipidemia, unspecified: Secondary | ICD-10-CM

## 2012-08-22 DIAGNOSIS — I10 Essential (primary) hypertension: Secondary | ICD-10-CM

## 2012-08-22 DIAGNOSIS — I2510 Atherosclerotic heart disease of native coronary artery without angina pectoris: Secondary | ICD-10-CM

## 2012-08-22 NOTE — Assessment & Plan Note (Signed)
Blood pressure elevated today. No change made to regimen today, although needs to follow with Dr. Leandrew Koyanagi.

## 2012-08-22 NOTE — Assessment & Plan Note (Signed)
I reviewed Dr. Bosie Helper recent note, and would agree that [name] would be high risk for an open vascular repair. If there are no other percutaneous interventional options, and the decision is to pursue open surgical repair, then she would need to have a cardiac catheterization done prior to this. It is not entirely clear however, that there would be revascularization options found and treatable, that would reduce her overall perioperative risk.

## 2012-08-22 NOTE — Progress Notes (Signed)
Clinical Summary [name] is a medically complex 67 y.o.female last seen in September 2013 by [name]. She has been managed medically with history of multivessel CAD status post CABG.  Carotid Dopplers in September of last year revealed 40-59% RICA stenosis, and 0-39% LICA stenosis. Lipid panel at that time showed cholesterol 129, triglycerides 104, HDL 47, and LDL 61.  She had a recent visit with [name] for evaluation of mesenteric ischemia. She is felt to be high risk for open aortic surgery and is being considered for interventional radiology approach.  Last ischemic evaluation was via Lexiscan Cardiolite in December 2011 demonstrating no diagnostic ST segment abnormalities, with evidence of anteroapical ischemia and basal inferolateral ischemia suggestive of graft disease, LVEF 69%. Cardiac catheterization done that year is detailed below, showing evidence of graft disease. Her last intervention was DES to the circumflex in January of 2011.  Fortunately, [name] denies any progressive angina symptoms on medical therapy. Her ECG shows sinus rhythm with decreased R-wave progression, nonspecific ST changes.   No Known Allergies  Current Outpatient Prescriptions  Medication Sig Dispense Refill  . alprazolam (XANAX) 2 MG tablet Take 2 mg by mouth at bedtime as needed. Anxiety.      Marland Kitchen amLODipine (NORVASC) 5 MG tablet Take 5 mg by mouth daily.        Marland Kitchen aspirin 81 MG tablet Take 81 mg by mouth daily.        . clopidogrel (PLAVIX) 75 MG tablet Take 75 mg by mouth daily.        . fluvoxaMINE (LUVOX) 50 MG tablet Take 50 mg by mouth at bedtime.      . furosemide (LASIX) 40 MG tablet Take 40 mg by mouth daily.       Marland Kitchen gabapentin (NEURONTIN) 300 MG capsule Take 300 mg by mouth 3 (three) times daily.      Marland Kitchen glyBURIDE (DIABETA) 5 MG tablet Take 10 mg by mouth 2 (two) times daily.       . hydrOXYzine (ATARAX/VISTARIL) 25 MG tablet Take 25 mg by mouth 3 (three) times daily as needed.  Ocassionally for allergies      . isosorbide mononitrate (IMDUR) 60 MG 24 hr tablet Take 1 tablet (60 mg total) by mouth daily.  30 tablet  3  . lactulose (CHRONULAC) 10 GM/15ML solution Take 10 g by mouth 3 (three) times daily.      . metFORMIN (GLUCOPHAGE) 500 MG tablet Take 1,000 mg by mouth 2 (two) times daily with a meal.       . metoprolol (LOPRESSOR) 50 MG tablet Take 50 mg by mouth 2 (two) times daily.       . nitroGLYCERIN (NITROSTAT) 0.4 MG SL tablet Place 1 tablet (0.4 mg total) under the tongue every 5 (five) minutes as needed.  90 tablet  3  . omeprazole (PRILOSEC) 20 MG capsule Take 20 mg by mouth daily.      Marland Kitchen oxyCODONE-acetaminophen (PERCOCET) 7.5-325 MG per tablet Take 1 tablet by mouth every 8 (eight) hours as needed. Pain.      . polyethylene glycol powder (GLYCOLAX/MIRALAX) powder Take 17 g by mouth daily.      . potassium chloride (K-DUR) 10 MEQ tablet Take 10 mEq by mouth 2 (two) times daily.       . pravastatin (PRAVACHOL) 80 MG tablet Take 80 mg by mouth daily.         No current facility-administered medications for this visit.    Past  Medical History  Diagnosis Date  . Mitral regurgitation     Mild to moderate  . Type 2 diabetes mellitus   . Obesity   . PAD (peripheral artery disease)   . Acute ischemic colitis 09/11/2005  . Coronary atherosclerosis of native coronary artery     Mulitvessel LVEF 50-50%, DES circ 1/11, occluded SVG to OM and SVG to diagonal , LVEF 60%  . Mixed hyperlipidemia   . Essential hypertension, benign   . Orthostatic hypotension   . Anxiety   . Depression   . Hypothyroidism   . PVC's (premature ventricular contractions)   . Tubular adenoma   . Diverticulosis of colon   . Atherosclerotic vascular disease     Calcified plaque at the origin of the celiac and SMA  . Mesenteric ischemia     Past Surgical History  Procedure Laterality Date  . Neck surgery      Cervical laminectomy  . Carpal tunnel release      Bilateral  .  Partial hysterectomy    . Back surgery      Lumbar spine surgery  . Coronary artery bypass graft      LIMA-LAD; SVG-OM; SVG-DX in 1995 [facility]  . Carotid endarectomy      Left  . Colonoscopy  09/14/2005    [name] colitis  . Colonoscopy with esophagogastroduodenoscopy (egd)  04/14/2012    [name]- EGD= gastric erosions of doubtful clinical significance per bx- chronic inactive gastritis, benign small bowel mucosa. TCS=colonic diverticulosis, tubular adenoma    Social History [name] reports that she has been smoking Cigarettes.  She has a 40 pack-year smoking history. She has never used smokeless tobacco. [name] reports that  drinks alcohol.  Review of Systems Complains of recurring diarrhea and abdominal pain, waxing and waning appetite. No palpitations or syncope. No orthopnea. No reported bleeding episodes.  Physical Examination Filed Vitals:   08/22/12 1057  BP: 171/84  Pulse: 63   Filed Weights   08/22/12 1057  Weight: 224 lb 12.8 oz (101.969 kg)    Obese, chronically ill-appearing woman in no acute distress.  HEENT: Conjunctiva and lids are grossly normal, oropharynx with poor dentition.  Neck: Supple, no obvious elevation in JVP.  Lungs: Diminished breath sounds without wheezing or labored breathing.  Cardiac: Regular rate and rhythm, no S3, soft systolic murmur.  Abdomen: Soft, nontender, bowel sounds present  Skin: Warm and dry.  Extremities: No pitting edema, distal pulses one plus.  Musculoskeletal: No kyphosis. Neuropsychiatric: Alert and oriented x3. Affect appropriate.   Problem List and Plan   CAD, NATIVE VESSEL Multivessel and graft disease status post CABG, last intervention being DES to circumflex in January 2011. Fortunately, she is symptomatically stable on medical therapy. ECG reviewed. No changes made to current regimen.  Chronic mesenteric ischemia I reviewed [name] recent note, and would agree that [name] would be  high risk for an open vascular repair. If there are no other percutaneous interventional options, and the decision is to pursue open surgical repair, then she would need to have a cardiac catheterization done prior to this. It is not entirely clear however, that there would be revascularization options found and treatable, that would reduce her overall perioperative risk.  HYPERLIPIDEMIA-MIXED She continues on Pravachol, followed by [name].  Essential hypertension, benign Blood pressure elevated today. No change made to regimen today, although needs to follow with [name].    Jonelle Sidle, M.D., F.A.C.C.

## 2012-08-22 NOTE — Patient Instructions (Addendum)

## 2012-08-22 NOTE — Assessment & Plan Note (Signed)
She continues on Pravachol, followed by Dr. Leandrew Koyanagi.

## 2012-08-22 NOTE — Assessment & Plan Note (Signed)
Multivessel and graft disease status post CABG, last intervention being DES to circumflex in January 2011. Fortunately, she is symptomatically stable on medical therapy. ECG reviewed. No changes made to current regimen.

## 2012-09-15 ENCOUNTER — Other Ambulatory Visit (HOSPITAL_COMMUNITY): Payer: Self-pay | Admitting: Interventional Radiology

## 2012-09-15 DIAGNOSIS — K559 Vascular disorder of intestine, unspecified: Secondary | ICD-10-CM

## 2012-09-18 ENCOUNTER — Ambulatory Visit
Admission: RE | Admit: 2012-09-18 | Discharge: 2012-09-18 | Disposition: A | Discharge status: Home / Self Care | Payer: Medicare Other | Source: Ambulatory Visit | Attending: Interventional Radiology | Admitting: Interventional Radiology

## 2012-09-18 DIAGNOSIS — K559 Vascular disorder of intestine, unspecified: Secondary | ICD-10-CM

## 2012-09-18 NOTE — Progress Notes (Signed)
Appetite:  Improving.  Slight improvement re:  Diarrhea.  Occasional nausea.  Continues to c/o RUQ discomfort, as well as abdominal discomfort.  Also, frequent episodes of bloating and gas.   Only taking pain meds now for episodes of increasing pain.  Fatigues easily & takes frequent naps due to fatigue.    Donye Dauenhauer Carmell Austria, Charity fundraiser

## 2013-03-04 ENCOUNTER — Ambulatory Visit (INDEPENDENT_AMBULATORY_CARE_PROVIDER_SITE_OTHER): Payer: Medicare Other | Admitting: Cardiology

## 2013-03-04 ENCOUNTER — Encounter: Payer: Self-pay | Admitting: Cardiology

## 2013-03-04 VITALS — BP 119/73 | HR 56 | Ht 64.0 in | Wt 222.0 lb

## 2013-03-04 DIAGNOSIS — E785 Hyperlipidemia, unspecified: Secondary | ICD-10-CM

## 2013-03-04 DIAGNOSIS — I2510 Atherosclerotic heart disease of native coronary artery without angina pectoris: Secondary | ICD-10-CM

## 2013-03-04 DIAGNOSIS — F17200 Nicotine dependence, unspecified, uncomplicated: Secondary | ICD-10-CM

## 2013-03-04 DIAGNOSIS — I080 Rheumatic disorders of both mitral and aortic valves: Secondary | ICD-10-CM

## 2013-03-04 DIAGNOSIS — I10 Essential (primary) hypertension: Secondary | ICD-10-CM

## 2013-03-04 NOTE — Assessment & Plan Note (Signed)
Blood pressure control is good today. No changes made to current regimen.

## 2013-03-04 NOTE — Progress Notes (Signed)
Clinical Summary Ms. Palen is a medically complex 67 y.o.female last seen in April of this year. Fortunately, she has been stable from a cardiac perspective. No progressive angina symptoms. She has chronic fatigue and dyspnea on exertion, no major change.  Last ischemic evaluation was via Lexiscan Cardiolite in December 2011 demonstrating no diagnostic ST segment abnormalities, with evidence of anteroapical ischemia and basal inferolateral ischemia suggestive of graft disease, LVEF 69%. Cardiac catheterization done that year is detailed below, showing evidence of graft disease. Her last intervention was DES to the circumflex in January of 2011.  She reports compliance with her medications, outlined below. No bleeding problems with DAPT.   No Known Allergies  Current Outpatient Prescriptions  Medication Sig Dispense Refill  . alprazolam (XANAX) 2 MG tablet Take 2 mg by mouth at bedtime as needed. Anxiety.      Marland Kitchen amLODipine (NORVASC) 5 MG tablet Take 5 mg by mouth daily.        Marland Kitchen aspirin 81 MG tablet Take 81 mg by mouth daily.        . clopidogrel (PLAVIX) 75 MG tablet Take 75 mg by mouth daily.        . fluvoxaMINE (LUVOX) 50 MG tablet Take 50 mg by mouth at bedtime.      . furosemide (LASIX) 40 MG tablet Take 40 mg by mouth daily.       Marland Kitchen glyBURIDE (DIABETA) 5 MG tablet Take 10 mg by mouth 2 (two) times daily.       . hydrOXYzine (ATARAX/VISTARIL) 25 MG tablet Take 25 mg by mouth 3 (three) times daily as needed. Ocassionally for allergies      . isosorbide mononitrate (IMDUR) 60 MG 24 hr tablet Take 1 tablet (60 mg total) by mouth daily.  30 tablet  3  . lactulose (CHRONULAC) 10 GM/15ML solution Take 10 g by mouth daily as needed.       . metFORMIN (GLUCOPHAGE) 500 MG tablet Take 1,000 mg by mouth 2 (two) times daily with a meal.       . metoprolol (LOPRESSOR) 50 MG tablet Take 50 mg by mouth 2 (two) times daily.       . nitroGLYCERIN (NITROSTAT) 0.4 MG SL tablet Place 1 tablet (0.4  mg total) under the tongue every 5 (five) minutes as needed.  90 tablet  3  . oxyCODONE-acetaminophen (PERCOCET) 7.5-325 MG per tablet Take 1 tablet by mouth every 8 (eight) hours as needed. Pain.      . polyethylene glycol powder (GLYCOLAX/MIRALAX) powder Take 17 g by mouth as needed.       . potassium chloride (K-DUR) 10 MEQ tablet Take 10 mEq by mouth daily.       . pravastatin (PRAVACHOL) 80 MG tablet Take 80 mg by mouth daily.         No current facility-administered medications for this visit.    Past Medical History  Diagnosis Date  . Mitral regurgitation     Mild to moderate  . Type 2 diabetes mellitus   . Obesity   . PAD (peripheral artery disease)   . Acute ischemic colitis 09/11/2005  . Coronary atherosclerosis of native coronary artery     Mulitvessel LVEF 50-50%, DES circ 1/11, occluded SVG to OM and SVG to diagonal , LVEF 60%  . Mixed hyperlipidemia   . Essential hypertension, benign   . Orthostatic hypotension   . Anxiety   . Depression   . Hypothyroidism   . PVC's (premature  ventricular contractions)   . Tubular adenoma   . Diverticulosis of colon   . Atherosclerotic vascular disease     Calcified plaque at the origin of the celiac and SMA  . Mesenteric ischemia     Past Surgical History  Procedure Laterality Date  . Neck surgery      Cervical laminectomy  . Carpal tunnel release      Bilateral  . Partial hysterectomy    . Back surgery      Lumbar spine surgery  . Coronary artery bypass graft      LIMA-LAD; SVG-OM; SVG-DX in 1995 [facility]  . Carotid endarectomy      Left  . Colonoscopy  09/14/2005    Dr. Jacqlyn Larsen colitis  . Colonoscopy with esophagogastroduodenoscopy (egd)  04/14/2012    Dr. Jena Gauss- EGD= gastric erosions of doubtful clinical significance per bx- chronic inactive gastritis, benign small bowel mucosa. TCS=colonic diverticulosis, tubular adenoma    Social History Ms. Schlink reports that she has been smoking Cigarettes.  She has a 40  pack-year smoking history. She has never used smokeless tobacco. Ms. Zabriskie reports that she drinks alcohol.  Review of Systems Chronic arthritic pain, difficulty sleeping. Neuropathic discomfort in her left arm and leg. Occasional brief palpitations. No syncope. Otherwise negative except as outlined.  Physical Examination Filed Vitals:   03/04/13 1302  BP: 119/73  Pulse: 56   Filed Weights   03/04/13 1302  Weight: 222 lb (100.699 kg)    Obese, chronically ill-appearing woman in no acute distress.  HEENT: Conjunctiva and lids are grossly normal, oropharynx with poor dentition.  Neck: Supple, no obvious elevation in JVP.  Lungs: Diminished breath sounds without wheezing or labored breathing.  Cardiac: Regular rate and rhythm, no S3, soft systolic murmur.  Abdomen: Soft, nontender, bowel sounds present  Skin: Warm and dry.  Extremities: No pitting edema, distal pulses one plus.  Musculoskeletal: No kyphosis.  Neuropsychiatric: Alert and oriented x3. Affect appropriate.   Problem List and Plan   CAD, NATIVE VESSEL Multivessel disease status post CABG with subsequent graft disease, most recently DES to the circumflex in January 2011. Continue medical therapy.  Essential hypertension, benign Blood pressure control is good today. No changes made to current regimen.  HYPERLIPIDEMIA-MIXED Continues on Pravachol. Keep followup with Dr. Leandrew Koyanagi.  TOBACCO USER Still smoking cigarettes, has not been able to quit.  MITRAL REGURGITATION Mild to moderate, no change in examination.    Jonelle Sidle, M.D., F.A.C.C.

## 2013-03-04 NOTE — Assessment & Plan Note (Signed)
Continues on Pravachol. Keep followup with Dr. Leandrew Koyanagi.

## 2013-03-04 NOTE — Patient Instructions (Signed)

## 2013-03-04 NOTE — Assessment & Plan Note (Signed)
Mild to moderate, no change in examination.

## 2013-03-04 NOTE — Assessment & Plan Note (Signed)
Still smoking cigarettes, has not been able to quit. 

## 2013-03-04 NOTE — Assessment & Plan Note (Signed)
Multivessel disease status post CABG with subsequent graft disease, most recently DES to the circumflex in January 2011. Continue medical therapy.

## 2013-04-09 IMAGING — XA IR ANGIO/VISCERAL SELECTIVE EA VESSEL WO/W FLUSH
1 series · 13 of 24 positions shown · non-contrast
Comparison: none

INDICATION: Abdominal pain, persistent diarrhea, unintentional
weight loss, CT findings worrisome for a long segment severe
stenosis of the SMA.
TECHNIQUE: Informed written consent was obtained from the patient after a
discussion of the risks, benefits and alternatives to treatment.
Questions regarding the procedure were encouraged and answered.  A
timeout was performed prior to the initiation of the procedure.

[Series 1: run · 13 of 84 slices shown]
[im 1/84]
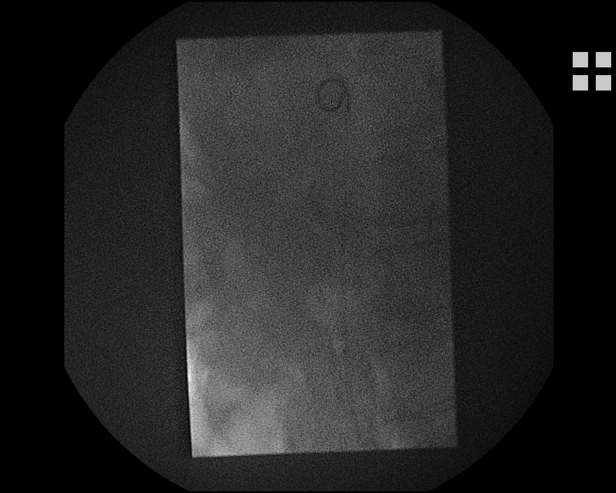
[im 8/84]
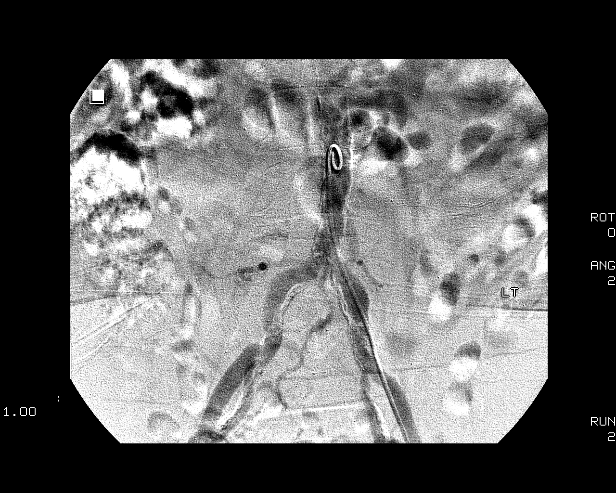
[im 15/84]
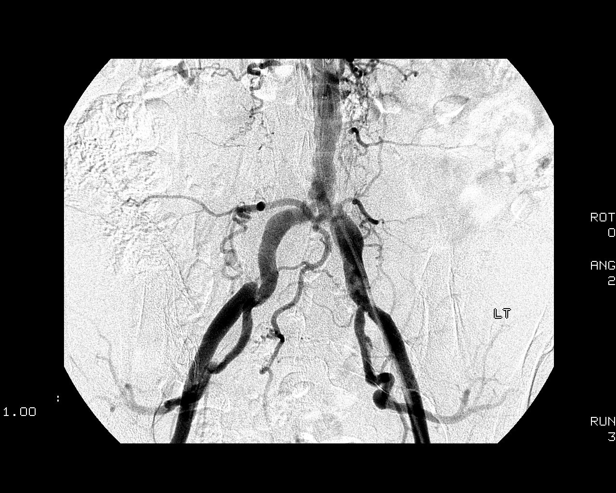
[im 22/84]
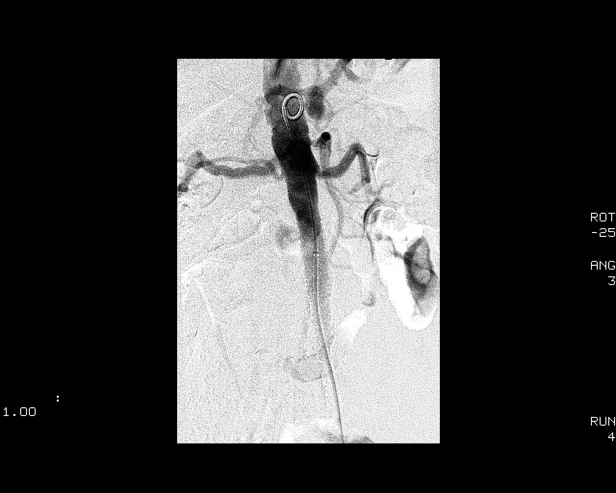
[im 29/84]
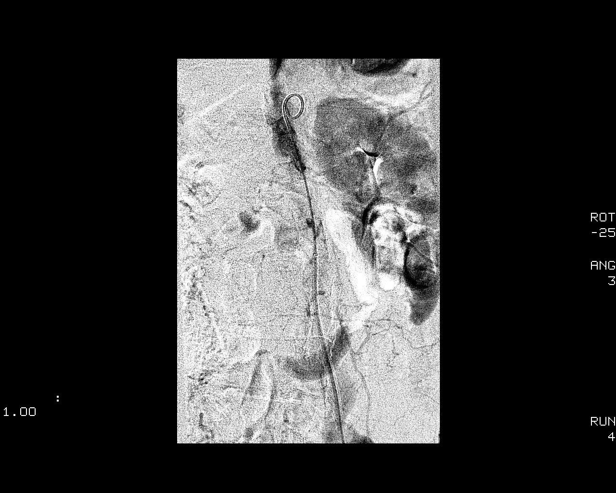
[im 37/84]
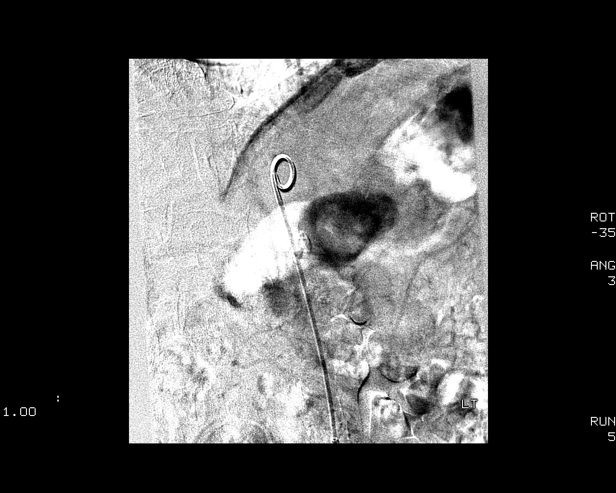
[im 44/84]
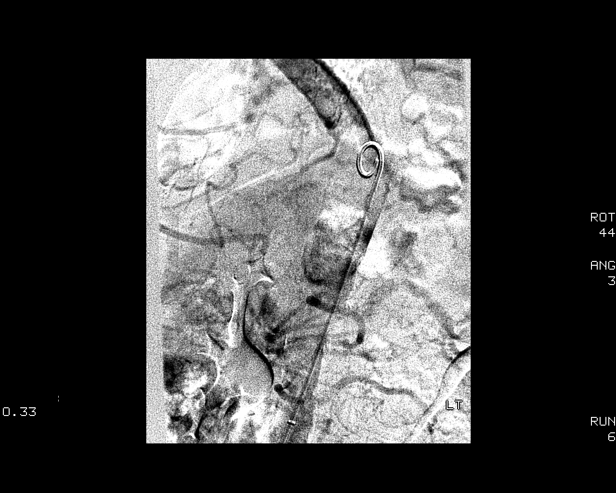
[im 47/84]
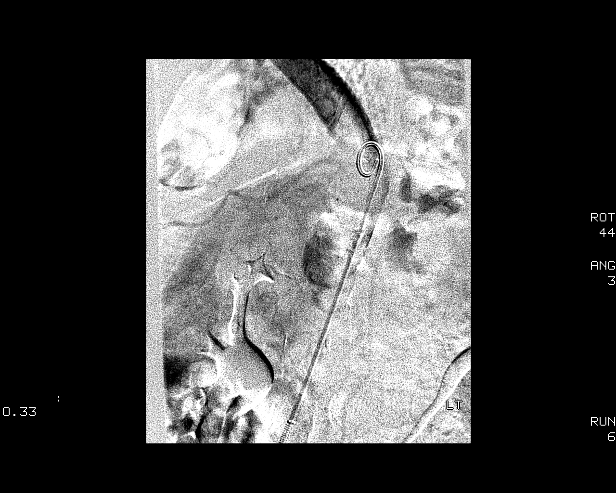
[im 55/84]
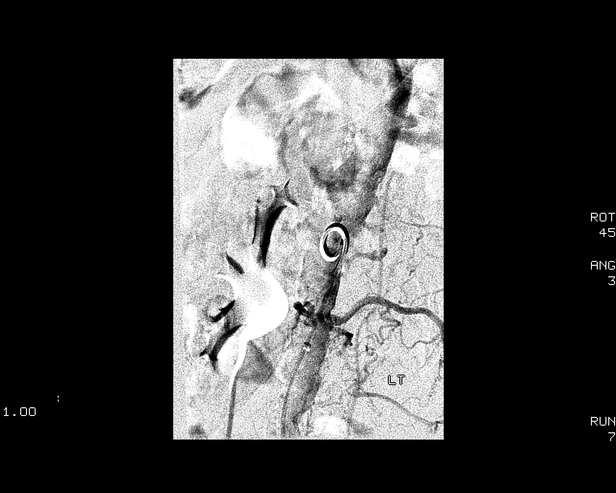
[im 62/84]
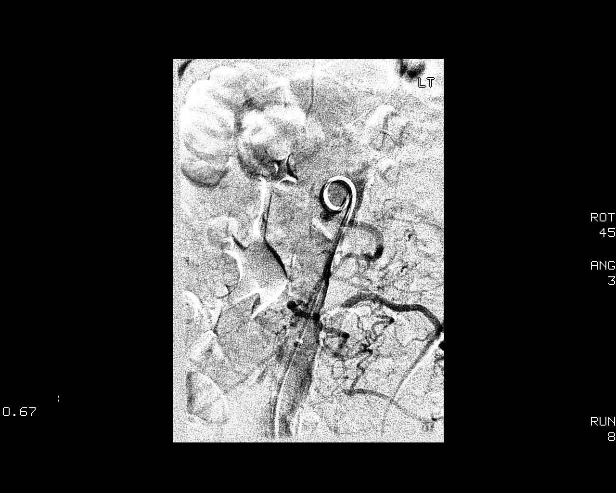
[im 69/84]
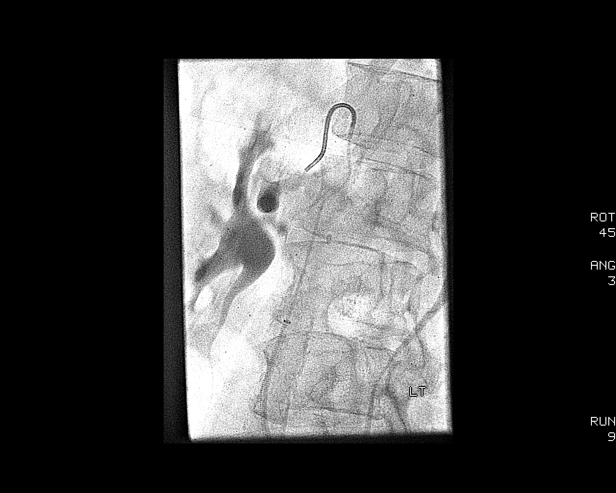
[im 76/84]
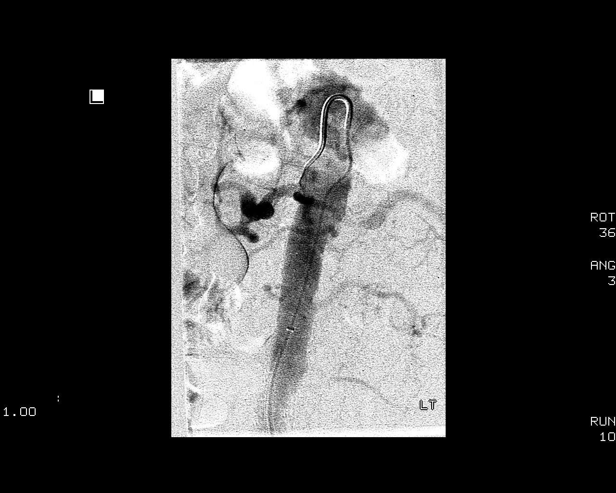
[im 84/84]
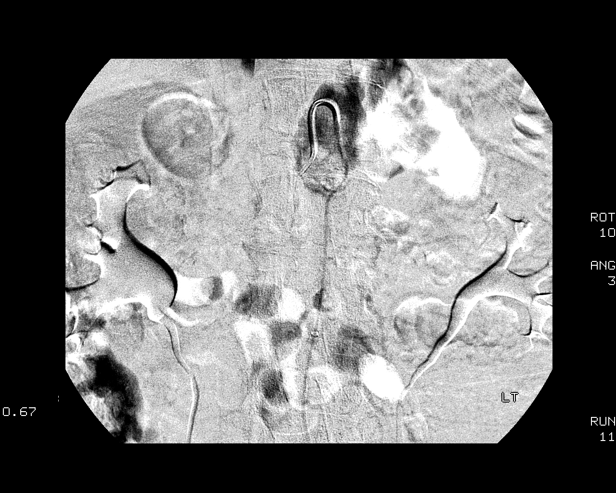

[13 of 24 positions shown; findings below may reference images not displayed]

1.  ABOMINAL AORTOGRAM
2.  CELIAC ARTERIOGRAM
3.  ULTRASOUUND GUIDANCE FOR ARTERIAL ACCESS

Comparisons: CTA abdomen and pelvis - 04/24/2012

Intravenous Medications: None

Contrast: 150 ml Wmnipaque-YBB

Total Moderate Sedation Time: 60 minutes

Fluoroscopy Time: 8.3 minutes.

Complications: None immediate
The bilateral groins were prepped and draped in the usual sterile
fashion, and a sterile drape was applied covering the operative
field.  Maximum barrier sterile technique with sterile gowns and
gloves were used for the procedure.  A timeout was performed prior
to the initiation of the procedure.  Local anesthesia was provided
with 1% lidocaine.

Given the known focal high-grade stenosis of the origin of the
right common iliac artery, the decision was made to access the left
common femoral artery.

The left femoral head was marked fluoroscopically.  Under
ultrasound guidance, the left common femoral artery was accessed
with a micropuncture kit after the overlying soft tissues were
anesthetized with 1% lidocaine.  An ultrasound image was saved for
documentation purposes.  The micropuncture sheath was exchanged for
a 5 French vascular sheath over a Bentson wire.  A closure
arteriogram was performed through the side of the sheath confirming
access within the right common femoral artery.

Over a Bentson wire, a pigtail flush catheter was advanced to the
abdominal aorta where it was back bled and flushed.  A limited
flush pelvic arteriogram was performed demonstrating persistent
flow through the left iliac system.  As such, under intermittent
fluoroscopic guidance, the existing left-sided 5 French vascular
sheath was exchanged for a long 6-French guiding vascular sheath
which was advanced into the inferior aspect of the abdominal aorta.
Pigtail was advanced to the inferior aspect of the abdominal aorta
and a diagnostic pelvic arteriogram was performed to ensure the 6-
French guiding sheath was nonocclusive at the known moderate
stenosis of the left common iliac artery.

The pigtail flush catheter was advanced to the expected location of
the takeoff of the celiac and SMA arteries, however secondary to
patient body habitus, a lateral abdominal aortogram was unable to
be performed.  As such, multiple flush abdominal aortogram were
performed in various obliquities.

The pigtail flush catheter was exchanged over a Benson wire for a
Mickelson catheter which was utilized to select the celiac artery.
A celiac arteriogram was performed.

Images reviewed and the procedure was terminated.  All wires
catheters and sheaths were removed from the patient.  Hemostasis
was achieved at the left groin access site with deployment of an
Exoseal closure device.  A dressing was placed.  The patient
tolerated procedure well without immediate postprocedural
complication.

-------------------------------------------------------------------
-------
FINDINGS: The left groin was selected for arterial access secondary to the
known focal high-grade stenosis at the origin of the right common
iliac artery demonstrated on preprocedural abdominal CTA.

Given the known focal high-grade narrowing at the origin of the
left common iliac artery, a pelvic arteriogram was performed to
ensure the 6-French guiding sheath was nonocclusive at the level of
the stenosis.

Pelvic arteriogram demonstrates a focal high-grade stenosis of the
origin of the right external iliac artery.  There is a focal short
segment moderate narrowing of the origin of the left common iliac
artery.  There is a hypertrophied common origin of the common trunk
of the bilateral L4 lumbar arteries.  Note is made of multiple
hypertrophied lumbar arteries as well as a hypertrophied immediate
sacral artery, findings supportive of the hemodynamic significance
of the bilateral common iliac arteries.

Attempt to perform a lateral abdominal aortogram to define the
origin and proximal aspects of both the celiac and superior
mesenteric arteries was unsuccessful secondary to patient body
habitus and underpenetration.

As such, multiple oblique/abdominal aortograms were perfor[REDACTED]ed over the expected location of the celiac artery and SMA.
Despite performing multiple abdominal aortogram in various
obliquities, the origin of the SMA was not well visualized.  The
proximal aspect of the SMA does appear patent (as best appreciated
on AP abdominal aortogram (image 9, run 4).

A Mickelson catheter was utilized to select the celiac artery.
Even with the celiac artery selected, the origin of the celiac was
difficult to discern secondary to obliquity.

The Mickelson catheter was not utilized to select the superior
mesenteric artery as the origin was not well defined despite
multiple oblique abdominal aortograms and given the presence of the
known high-grade ostial stenosis as demonstrated on preprocedural
abdominal CTA.
IMPRESSION: 1.  Suboptimal evaluation of the origin of the superior mesenteric
artery secondary to inability to perform a lateral aortogram due to
patient body habitus and underpenetration.  Despite performing
multiple flush abdominal aortograms in various obliquities, the
origin of the superior mesenteric artery was never well opacified.
Selective injection of the superior mesenteric artery was not
performed as the origin was not well defined and given the presence
of the known high grade ostial stenosis as demonstrated on
preprocedural abdominal CTA.  The proximal aspect of the SMA
remains patent.
2.  Selective celiac arteriogram again demonstrates apparent
poststenotic dilatation involving the main trunk of the celiac
artery, however similarly, the origin of the celiac artery was
suboptimally visualized secondary to obliquity and patient body
habitus.
3.  Focal high-grade stenosis of the origin of the right common
iliac and moderate focal stenosis involving the origin of the left
common iliac, resulting in hemodynamically significant narrowing
supported by the presence of multiple hypertrophied lumbar arteries
as well as a hypertrophied median sacral artery.

Plan:

Given the lack of adequate evaluation of the origins of both the
celiac and superior mesenteric arteries, no intervention was
performed.  Will consider repeat diagnostic arteriogram with
possible intervention in the biplane room at the [facility].  As of note, the hemodynamically significant narrowing of
the bilateral common iliac arteries may ultimately require
treatment with placement of kissing iliac stents.

## 2013-05-07 IMAGING — US US PELVIS LIMITED
1 series · 14 of 16 positions shown · non-contrast
Comparison: None.

CLINICAL DATA: Post mesenteric arteriogram (performed 04/24/2012)
with persistent palpable lump within the left groin.  Evaluate for
hematoma versus pseudoaneurysm

LIMITED ULTRASOUND OF PELVIC SOFT TISSUES
TECHNIQUE: Ultrasound examination of the pelvic soft tissues was
performed in the area of clinical concern.

[Series 1: us pelvis limited · 0.12mm/px · 14 of 16 slices shown]
[im 1/16]
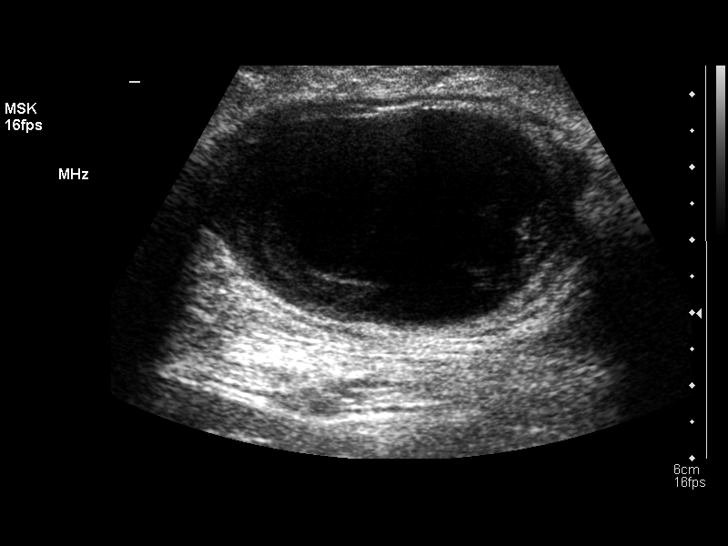
[im 2/16]
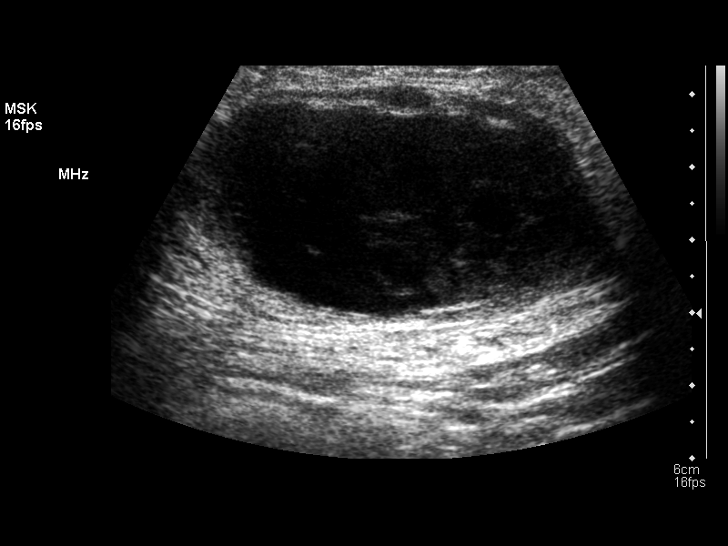
[im 3/16]
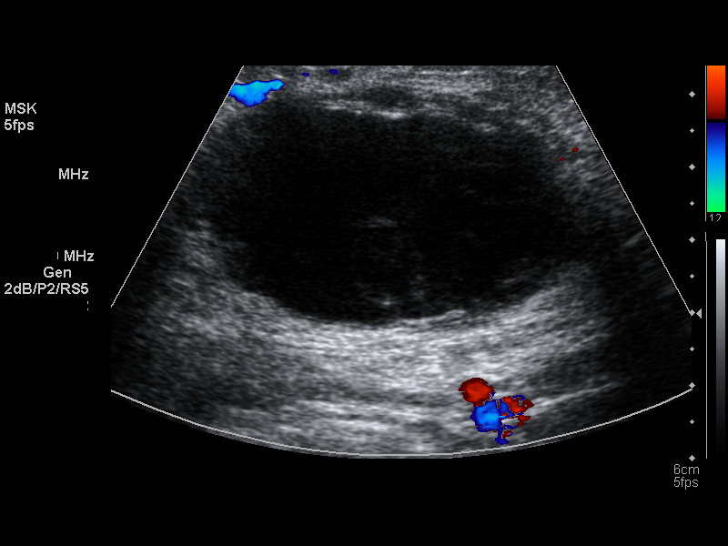
[im 5/16]
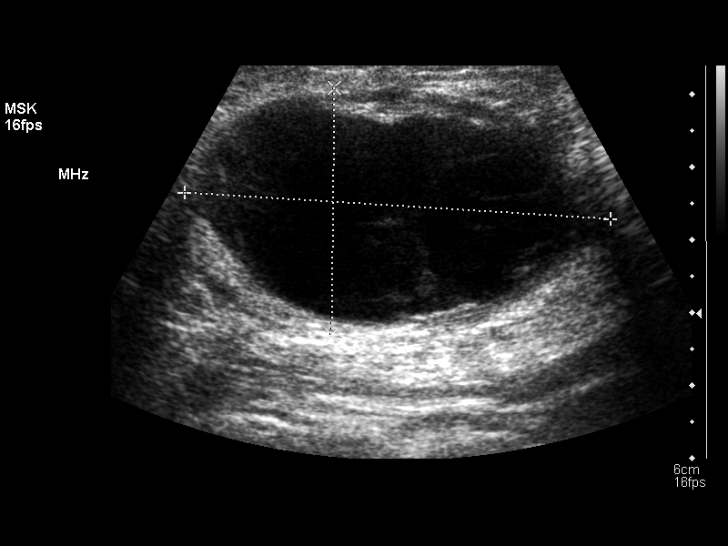
[im 6/16]
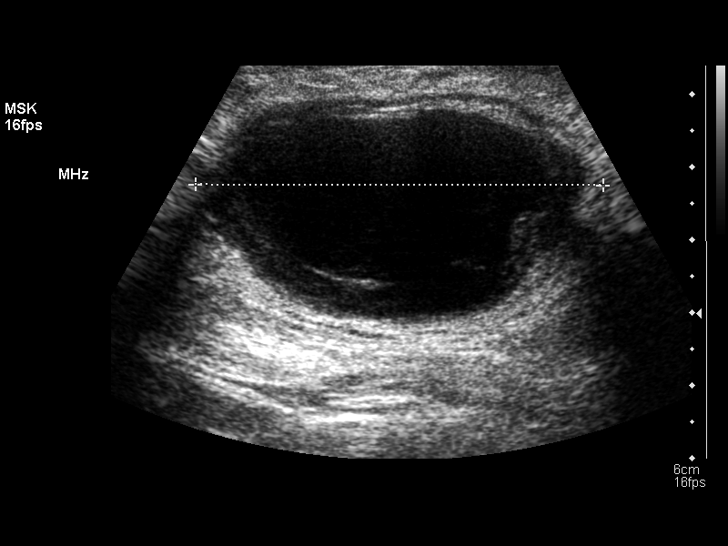
[im 7/16]
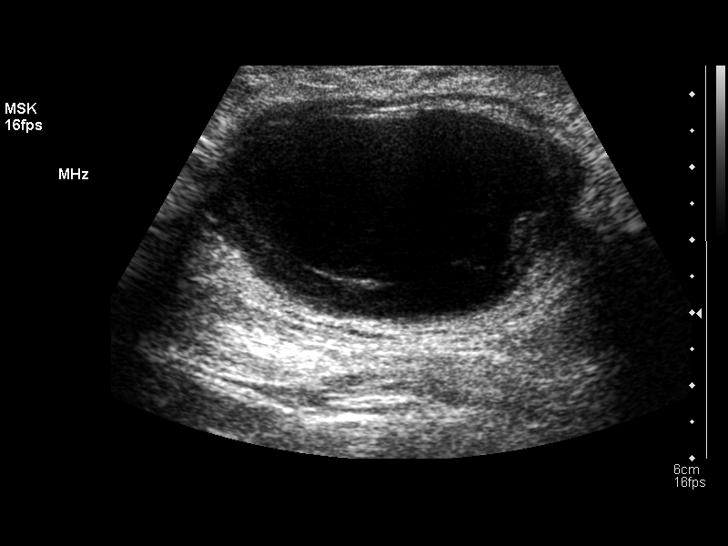
[im 8/16]
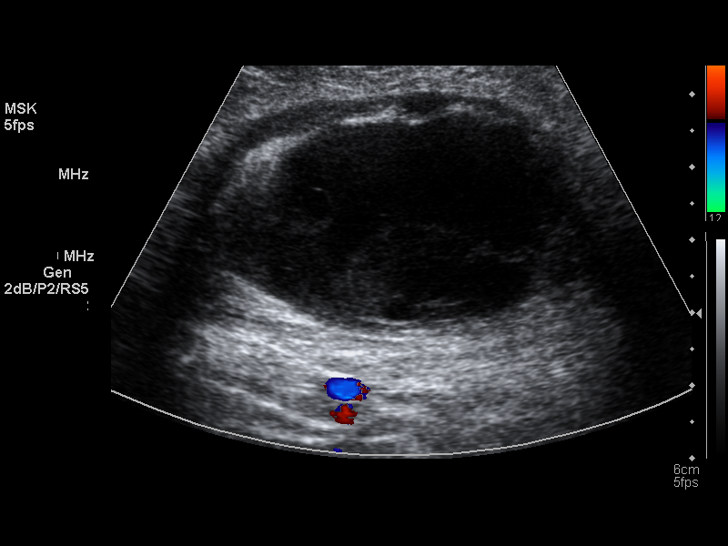
[im 9/16]
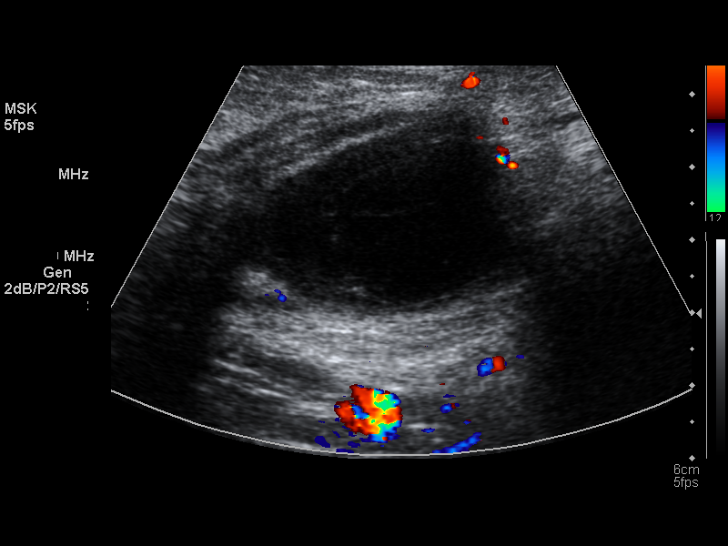
[im 10/16]
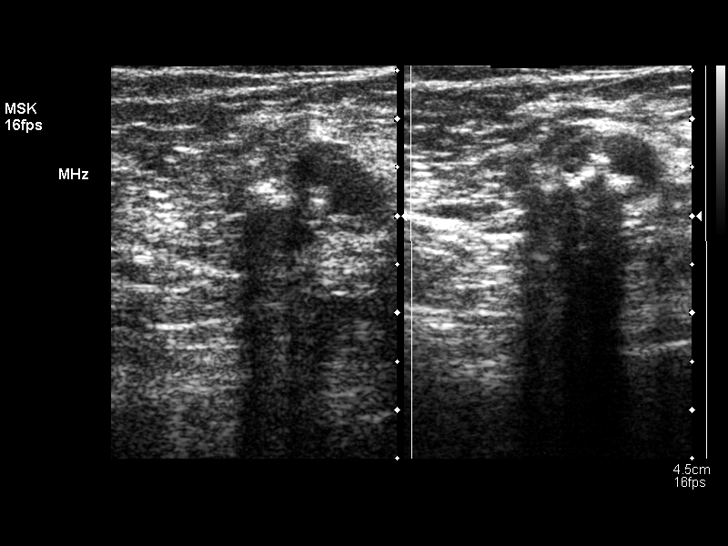
[im 11/16]
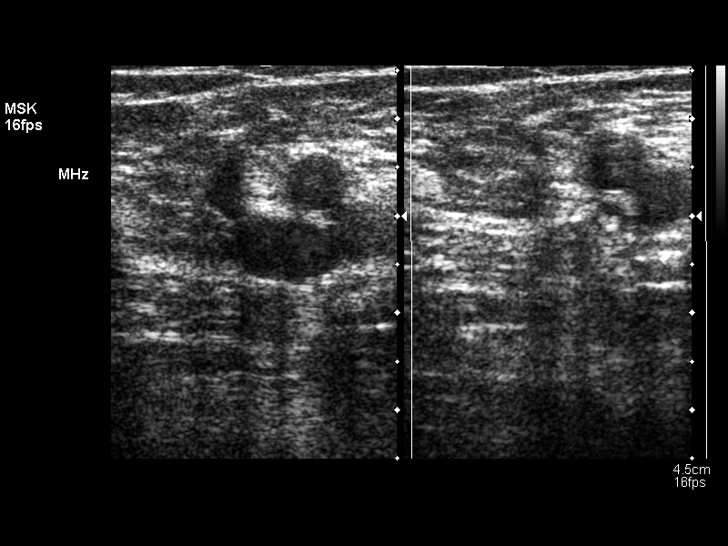
[im 13/16]
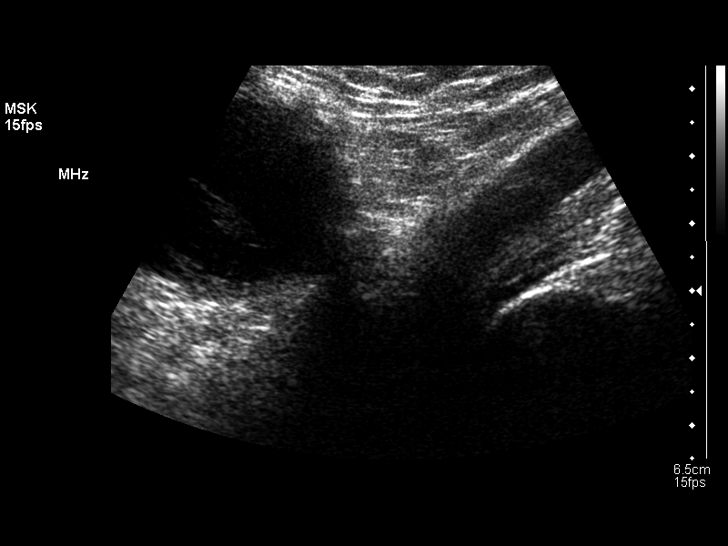
[im 14/16]
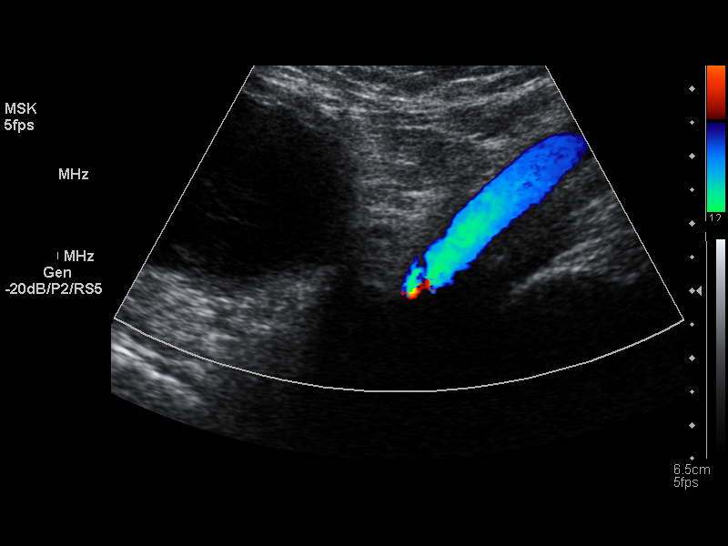
[im 15/16]
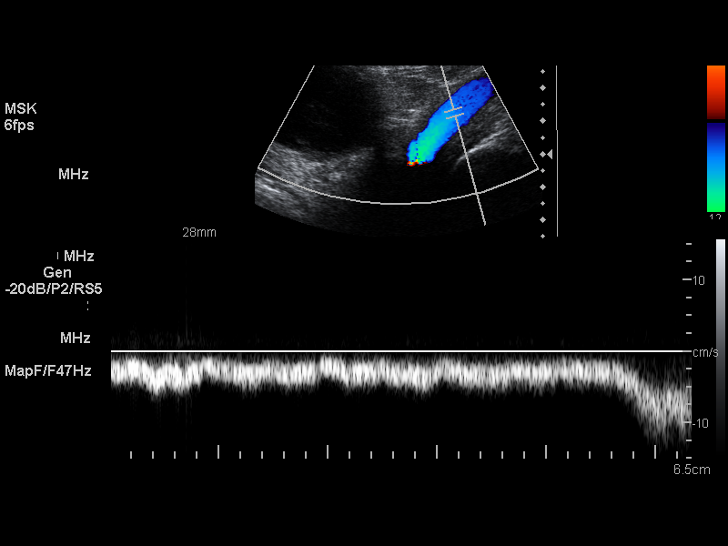
[im 16/16]
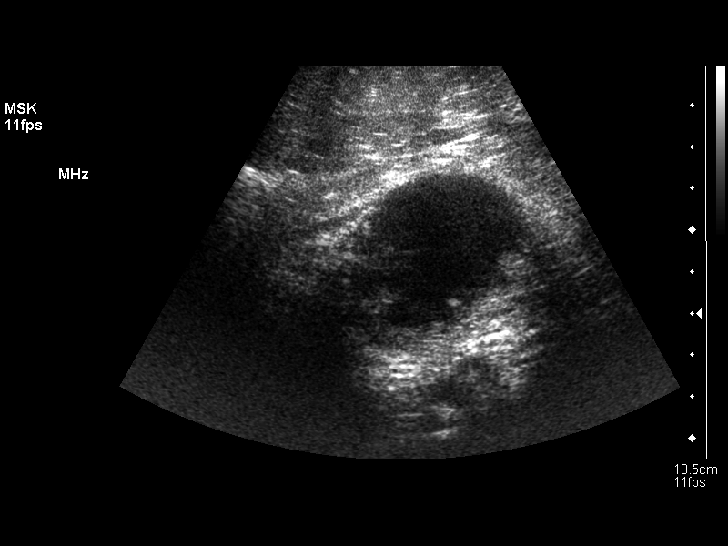

[14 of 16 positions shown; findings below may reference images not displayed]

FINDINGS: There is an approximately 5.9 x 3.4 x 5.6 cm complex, largely
anechoic cystic structure within the left groin.  This structure
does not demonstrate internal blood flow and does not definitely
demonstrate communication within the adjacent vasculature.  There
is no discrete evidence of adjacent soft tissue bruit.
IMPRESSION: Approximately 5.9 cm complex cystic structure within the left groin
compatible with an evolving hematoma.  No definite evidence of
pseudoaneurysm.

## 2013-08-05 ENCOUNTER — Encounter (HOSPITAL_COMMUNITY)
Admission: RE | Admit: 2013-08-05 | Discharge: 2013-08-05 | Disposition: A | Discharge status: Home / Self Care | Payer: Medicare Other | Source: Ambulatory Visit | Attending: Ophthalmology | Admitting: Ophthalmology

## 2013-08-05 ENCOUNTER — Encounter (HOSPITAL_COMMUNITY): Payer: Self-pay

## 2013-08-05 HISTORY — DX: Schizophrenia, unspecified: F20.9

## 2013-08-05 HISTORY — DX: Unspecified osteoarthritis, unspecified site: M19.90

## 2013-08-10 ENCOUNTER — Ambulatory Visit (HOSPITAL_COMMUNITY): Admission: RE | Admit: 2013-08-10 | Payer: Medicare Other | Source: Ambulatory Visit | Admitting: Ophthalmology

## 2013-08-10 ENCOUNTER — Encounter (HOSPITAL_COMMUNITY): Payer: Self-pay | Admitting: Pharmacy Technician

## 2013-08-10 ENCOUNTER — Encounter (HOSPITAL_COMMUNITY): Admission: RE | Payer: Self-pay | Source: Ambulatory Visit

## 2013-08-10 SURGERY — PHACOEMULSIFICATION, CATARACT, WITH IOL INSERTION
Anesthesia: Monitor Anesthesia Care | Site: Eye | Laterality: Right

## 2013-08-17 NOTE — Patient Instructions (Signed)
Your procedure is scheduled on: 08/24/2013  Report to [facility] at  28  AM.  Call this number if you have problems the morning of surgery: [phone]   Do not eat food or drink liquids :After Midnight.      Take these medicines the morning of surgery with A SIP OF WATER: xanax, amlodipine, hydroxyzine, isosorbide, metoprolol, oxycodone   Do not wear jewelry, make-up or nail polish.  Do not wear lotions, powders, or perfumes.   Do not shave 48 hours prior to surgery.  Do not bring valuables to the hospital.  Contacts, dentures or bridgework may not be worn into surgery.  Leave suitcase in the car. After surgery it may be brought to your room.  For patients admitted to the hospital, checkout time is 11:00 AM the day of discharge.   Patients discharged the day of surgery will not be allowed to drive home.  :     Please read over the following fact sheets that you were given: Coughing and Deep Breathing, Surgical Site Infection Prevention, Anesthesia Post-op Instructions and Care and Recovery After Surgery    Cataract A cataract is a clouding of the lens of the eye. When a lens becomes cloudy, vision is reduced based on the degree and nature of the clouding. Many cataracts reduce vision to some degree. Some cataracts make people more near-sighted as they develop. Other cataracts increase glare. Cataracts that are ignored and become worse can sometimes look white. The white color can be seen through the pupil. CAUSES   Aging. However, cataracts may occur at any age, even in newborns.   Certain drugs.   Trauma to the eye.   Certain diseases such as diabetes.   Specific eye diseases such as chronic inflammation inside the eye or a sudden attack of a rare form of glaucoma.   Inherited or acquired medical problems.  SYMPTOMS   Gradual, progressive drop in vision in the affected eye.   Severe, rapid visual loss. This most often happens when trauma is the cause.  DIAGNOSIS  To detect  a cataract, an eye doctor examines the lens. Cataracts are best diagnosed with an exam of the eyes with the pupils enlarged (dilated) by drops.  TREATMENT  For an early cataract, vision may improve by using different eyeglasses or stronger lighting. If that does not help your vision, surgery is the only effective treatment. A cataract needs to be surgically removed when vision loss interferes with your everyday activities, such as driving, reading, or watching TV. A cataract may also have to be removed if it prevents examination or treatment of another eye problem. Surgery removes the cloudy lens and usually replaces it with a substitute lens (intraocular lens, IOL).  At a time when both you and your doctor agree, the cataract will be surgically removed. If you have cataracts in both eyes, only one is usually removed at a time. This allows the operated eye to heal and be out of danger from any possible problems after surgery (such as infection or poor wound healing). In rare cases, a cataract may be doing damage to your eye. In these cases, your caregiver may advise surgical removal right away. The vast majority of people who have cataract surgery have better vision afterward. HOME CARE INSTRUCTIONS  If you are not planning surgery, you may be asked to do the following:  Use different eyeglasses.   Use stronger or brighter lighting.   Ask your eye doctor about reducing your medicine  dose or changing medicines if it is thought that a medicine caused your cataract. Changing medicines does not make the cataract go away on its own.   Become familiar with your surroundings. Poor vision can lead to injury. Avoid bumping into things on the affected side. You are at a higher risk for tripping or falling.   Exercise extreme care when driving or operating machinery.   Wear sunglasses if you are sensitive to bright light or experiencing problems with glare.  SEEK IMMEDIATE MEDICAL CARE IF:   You have a  worsening or sudden vision loss.   You notice redness, swelling, or increasing pain in the eye.   You have a fever.  Document Released: 04/16/2005 Document Revised: 04/05/2011 Document Reviewed: 12/08/2010 [facility] Patient Information 2012 [place].PATIENT INSTRUCTIONS POST-ANESTHESIA  IMMEDIATELY FOLLOWING SURGERY:  Do not drive or operate machinery for the first twenty four hours after surgery.  Do not make any important decisions for twenty four hours after surgery or while taking narcotic pain medications or sedatives.  If you develop intractable nausea and vomiting or a severe headache please notify your doctor immediately.  FOLLOW-UP:  Please make an appointment with your surgeon as instructed. You do not need to follow up with anesthesia unless specifically instructed to do so.  WOUND CARE INSTRUCTIONS (if applicable):  Keep a dry clean dressing on the anesthesia/puncture wound site if there is drainage.  Once the wound has quit draining you may leave it open to air.  Generally you should leave the bandage intact for twenty four hours unless there is drainage.  If the epidural site drains for more than 36-48 hours please call the anesthesia department.  QUESTIONS?:  Please feel free to call your physician or the hospital operator if you have any questions, and they will be happy to assist you.

## 2013-08-18 ENCOUNTER — Other Ambulatory Visit: Payer: Self-pay

## 2013-08-18 ENCOUNTER — Encounter (HOSPITAL_COMMUNITY): Payer: Self-pay

## 2013-08-18 ENCOUNTER — Encounter (HOSPITAL_COMMUNITY)
Admission: RE | Admit: 2013-08-18 | Discharge: 2013-08-18 | Disposition: A | Discharge status: Home / Self Care | Payer: Medicare Other | Source: Ambulatory Visit | Attending: Ophthalmology | Admitting: Ophthalmology

## 2013-08-18 DIAGNOSIS — Z01810 Encounter for preprocedural cardiovascular examination: Secondary | ICD-10-CM | POA: Insufficient documentation

## 2013-08-18 DIAGNOSIS — Z01812 Encounter for preprocedural laboratory examination: Secondary | ICD-10-CM | POA: Insufficient documentation

## 2013-08-18 HISTORY — DX: Sleep apnea, unspecified: G47.30

## 2013-08-18 HISTORY — DX: Acute myocardial infarction, unspecified: I21.9

## 2013-08-18 LAB — HEMOGLOBIN AND HEMATOCRIT, BLOOD
HCT: 37.9 % (ref 36.0–46.0)
Hemoglobin: 13.2 g/dL (ref 12.0–15.0)

## 2013-08-18 LAB — BASIC METABOLIC PANEL
BUN: 10 mg/dL (ref 6–23)
CHLORIDE: 98 meq/L (ref 96–112)
CO2: 32 meq/L (ref 19–32)
Calcium: 8.9 mg/dL (ref 8.4–10.5)
Creatinine, Ser: 0.62 mg/dL (ref 0.50–1.10)
GFR calc non Af Amer: 90 mL/min — ABNORMAL LOW (ref >90)
Glucose, Bld: 155 mg/dL — ABNORMAL HIGH (ref 70–99)
Potassium: 3.1 mEq/L — ABNORMAL LOW (ref 3.7–5.3)
Sodium: 143 mEq/L (ref 137–147)

## 2013-08-18 NOTE — Pre-Procedure Instructions (Signed)
Patient given information to sign up for my chart at home. 

## 2013-08-21 MED ORDER — NEOMYCIN-POLYMYXIN-DEXAMETH 3.5-10000-0.1 OP SUSP
OPHTHALMIC | Status: AC
  Filled 2013-08-21: qty 5

## 2013-08-21 MED ORDER — CYCLOPENTOLATE-PHENYLEPHRINE OP SOLN OPTIME - NO CHARGE
OPHTHALMIC | Status: AC
  Filled 2013-08-21: qty 2

## 2013-08-21 MED ORDER — PHENYLEPHRINE HCL 2.5 % OP SOLN
OPHTHALMIC | Status: AC
  Filled 2013-08-21: qty 15

## 2013-08-21 MED ORDER — TETRACAINE HCL 0.5 % OP SOLN
OPHTHALMIC | Status: AC
  Filled 2013-08-21: qty 2

## 2013-08-21 MED ORDER — LIDOCAINE HCL (PF) 1 % IJ SOLN
INTRAMUSCULAR | Status: AC
  Filled 2013-08-21: qty 2

## 2013-08-21 MED ORDER — LIDOCAINE HCL 3.5 % OP GEL
OPHTHALMIC | Status: AC
  Filled 2013-08-21: qty 1

## 2013-08-24 ENCOUNTER — Ambulatory Visit (HOSPITAL_COMMUNITY)
Admission: RE | Admit: 2013-08-24 | Discharge: 2013-08-24 | Disposition: A | Discharge status: Home / Self Care | Payer: Medicare Other | Source: Ambulatory Visit | Attending: Ophthalmology | Admitting: Ophthalmology

## 2013-08-24 ENCOUNTER — Encounter (HOSPITAL_COMMUNITY)
Admission: RE | Disposition: A | Discharge status: Home / Self Care | Payer: Self-pay | Source: Ambulatory Visit | Attending: Ophthalmology

## 2013-08-24 ENCOUNTER — Ambulatory Visit (HOSPITAL_COMMUNITY): Payer: Medicare Other | Admitting: Anesthesiology

## 2013-08-24 ENCOUNTER — Encounter (HOSPITAL_COMMUNITY): Payer: Medicare Other | Admitting: Anesthesiology

## 2013-08-24 ENCOUNTER — Encounter (HOSPITAL_COMMUNITY): Payer: Self-pay | Admitting: *Deleted

## 2013-08-24 DIAGNOSIS — H269 Unspecified cataract: Secondary | ICD-10-CM | POA: Insufficient documentation

## 2013-08-24 DIAGNOSIS — F17200 Nicotine dependence, unspecified, uncomplicated: Secondary | ICD-10-CM | POA: Insufficient documentation

## 2013-08-24 DIAGNOSIS — E119 Type 2 diabetes mellitus without complications: Secondary | ICD-10-CM | POA: Insufficient documentation

## 2013-08-24 DIAGNOSIS — F411 Generalized anxiety disorder: Secondary | ICD-10-CM | POA: Insufficient documentation

## 2013-08-24 DIAGNOSIS — Z79899 Other long term (current) drug therapy: Secondary | ICD-10-CM | POA: Insufficient documentation

## 2013-08-24 DIAGNOSIS — Z7982 Long term (current) use of aspirin: Secondary | ICD-10-CM | POA: Insufficient documentation

## 2013-08-24 DIAGNOSIS — I10 Essential (primary) hypertension: Secondary | ICD-10-CM | POA: Insufficient documentation

## 2013-08-24 HISTORY — PX: CATARACT EXTRACTION W/PHACO: SHX586

## 2013-08-24 LAB — GLUCOSE, CAPILLARY
Glucose-Capillary: 58 mg/dL — ABNORMAL LOW (ref 70–99)
Glucose-Capillary: 197 mg/dL — ABNORMAL HIGH (ref 70–99)

## 2013-08-24 SURGERY — PHACOEMULSIFICATION, CATARACT, WITH IOL INSERTION
Anesthesia: Monitor Anesthesia Care | Site: Eye | Laterality: Right

## 2013-08-24 MED ORDER — EPINEPHRINE HCL 1 MG/ML IJ SOLN
INTRAOCULAR | Status: DC | PRN
  Administered 2013-08-24: 11:00:00

## 2013-08-24 MED ORDER — DEXTROSE 50 % IV SOLN
INTRAVENOUS | Status: AC
  Filled 2013-08-24: qty 50

## 2013-08-24 MED ORDER — FENTANYL CITRATE 0.05 MG/ML IJ SOLN
INTRAMUSCULAR | Status: AC
  Filled 2013-08-24: qty 2

## 2013-08-24 MED ORDER — LACTATED RINGERS IV SOLN
INTRAVENOUS | Status: DC
  Administered 2013-08-24: 10:00:00 via INTRAVENOUS

## 2013-08-24 MED ORDER — POVIDONE-IODINE 5 % OP SOLN
OPHTHALMIC | Status: DC | PRN
  Administered 2013-08-24: 1 via OPHTHALMIC

## 2013-08-24 MED ORDER — TETRACAINE HCL 0.5 % OP SOLN
1.0000 [drp] | OPHTHALMIC | Status: AC
  Administered 2013-08-24 (×3): 1 [drp] via OPHTHALMIC

## 2013-08-24 MED ORDER — LIDOCAINE 3.5 % OP GEL OPTIME - NO CHARGE
OPHTHALMIC | Status: DC | PRN
  Administered 2013-08-24: 1 [drp] via OPHTHALMIC

## 2013-08-24 MED ORDER — BSS IO SOLN
INTRAOCULAR | Status: DC | PRN
  Administered 2013-08-24: 15 mL via INTRAOCULAR

## 2013-08-24 MED ORDER — LACTATED RINGERS IV SOLN
INTRAVENOUS | Status: DC | PRN
  Administered 2013-08-24: 10:00:00 via INTRAVENOUS

## 2013-08-24 MED ORDER — NEOMYCIN-POLYMYXIN-DEXAMETH 3.5-10000-0.1 OP SUSP
OPHTHALMIC | Status: DC | PRN
  Administered 2013-08-24: 2 [drp] via OPHTHALMIC

## 2013-08-24 MED ORDER — MIDAZOLAM HCL 2 MG/2ML IJ SOLN
1.0000 mg | INTRAMUSCULAR | Status: DC | PRN
  Administered 2013-08-24: 2 mg via INTRAVENOUS

## 2013-08-24 MED ORDER — PROVISC 10 MG/ML IO SOLN
INTRAOCULAR | Status: DC | PRN
  Administered 2013-08-24: 0.85 mL via INTRAOCULAR

## 2013-08-24 MED ORDER — DEXTROSE 50 % IV SOLN
1.0000 | Freq: Once | INTRAVENOUS | Status: AC
  Administered 2013-08-24: 50 mL via INTRAVENOUS

## 2013-08-24 MED ORDER — MIDAZOLAM HCL 2 MG/2ML IJ SOLN
INTRAMUSCULAR | Status: AC
  Filled 2013-08-24: qty 2

## 2013-08-24 MED ORDER — FENTANYL CITRATE 0.05 MG/ML IJ SOLN
25.0000 ug | INTRAMUSCULAR | Status: AC
  Administered 2013-08-24 (×2): 25 ug via INTRAVENOUS

## 2013-08-24 MED ORDER — CYCLOPENTOLATE-PHENYLEPHRINE 0.2-1 % OP SOLN
1.0000 [drp] | OPHTHALMIC | Status: AC
  Administered 2013-08-24 (×3): 1 [drp] via OPHTHALMIC

## 2013-08-24 MED ORDER — LIDOCAINE HCL 3.5 % OP GEL
1.0000 "application " | Freq: Once | OPHTHALMIC | Status: AC
  Administered 2013-08-24: 1 via OPHTHALMIC

## 2013-08-24 MED ORDER — LIDOCAINE HCL (PF) 1 % IJ SOLN
INTRAMUSCULAR | Status: DC | PRN
  Administered 2013-08-24: .6 mL

## 2013-08-24 MED ORDER — PHENYLEPHRINE HCL 2.5 % OP SOLN
1.0000 [drp] | OPHTHALMIC | Status: AC
  Administered 2013-08-24 (×3): 1 [drp] via OPHTHALMIC

## 2013-08-24 SURGICAL SUPPLY — 31 items
CAPSULAR TENSION RING-AMO (OPHTHALMIC RELATED) IMPLANT
CLOTH BEACON ORANGE TIMEOUT ST (SAFETY) ×2 IMPLANT
EYE SHIELD UNIVERSAL CLEAR (GAUZE/BANDAGES/DRESSINGS) ×2 IMPLANT
GLOVE BIO SURGEON STRL SZ 6.5 (GLOVE) IMPLANT
GLOVE BIO SURGEONS STRL SZ 6.5 (GLOVE)
GLOVE BIOGEL PI IND STRL 6.5 (GLOVE) IMPLANT
GLOVE BIOGEL PI IND STRL 7.0 (GLOVE) IMPLANT
GLOVE BIOGEL PI IND STRL 7.5 (GLOVE) IMPLANT
GLOVE BIOGEL PI INDICATOR 6.5 (GLOVE)
GLOVE BIOGEL PI INDICATOR 7.0 (GLOVE) ×4
GLOVE BIOGEL PI INDICATOR 7.5 (GLOVE)
GLOVE ECLIPSE 6.5 STRL STRAW (GLOVE) IMPLANT
GLOVE ECLIPSE 7.0 STRL STRAW (GLOVE) IMPLANT
GLOVE ECLIPSE 7.5 STRL STRAW (GLOVE) IMPLANT
GLOVE EXAM NITRILE LRG STRL (GLOVE) IMPLANT
GLOVE EXAM NITRILE MD LF STRL (GLOVE) IMPLANT
GLOVE SKINSENSE NS SZ6.5 (GLOVE)
GLOVE SKINSENSE NS SZ7.0 (GLOVE)
GLOVE SKINSENSE STRL SZ6.5 (GLOVE) IMPLANT
GLOVE SKINSENSE STRL SZ7.0 (GLOVE) IMPLANT
KIT VITRECTOMY (OPHTHALMIC RELATED) IMPLANT
PAD ARMBOARD 7.5X6 YLW CONV (MISCELLANEOUS) ×2 IMPLANT
PROC W NO LENS (INTRAOCULAR LENS)
PROC W SPEC LENS (INTRAOCULAR LENS)
PROCESS W NO LENS (INTRAOCULAR LENS) IMPLANT
PROCESS W SPEC LENS (INTRAOCULAR LENS) IMPLANT
RING MALYGIN (MISCELLANEOUS) IMPLANT
SIGHTPATH CAT PROC W REG LENS (Ophthalmic Related) ×3 IMPLANT
SYR TB 1ML LL NO SAFETY (SYRINGE) IMPLANT
VISCOELASTIC ADDITIONAL (OPHTHALMIC RELATED) IMPLANT
WATER STERILE IRR 250ML POUR (IV SOLUTION) ×2 IMPLANT

## 2013-08-24 NOTE — Progress Notes (Signed)
Hypoglycemic Event  CBG: 58 at 1011  Treatment: D50 IV 50 mL at 1021 per Dr. Domingo Dimes order (had to insert IV prior to giving D50 after finding out about hypoglycemia)  Symptoms: None. CBG was taken due to pt going to surgery.  Follow-up CBG: Time:1036 CBG Result:197  Possible Reasons for Event: Inadequate meal intake. Pt NPO due to going to surgery.  Comments/MD notified:Dr. Patsey Berthold notified and gave verbal order to give 1 amp of D50    Sae Handrich Renae Aadhav Uhlig

## 2013-08-24 NOTE — Transfer of Care (Signed)
Immediate Anesthesia Transfer of Care Note  Patient: [name]  Procedure(s) Performed: Procedure(s) with comments: CATARACT EXTRACTION PHACO AND INTRAOCULAR LENS PLACEMENT (IOC) (Right) - CDE 8.68  Patient Location: Short Stay  Anesthesia Type:MAC  Level of Consciousness: awake, alert , oriented and patient cooperative  Airway & Oxygen Therapy: Patient Spontanous Breathing  Post-op Assessment: Report given to PACU RN and Post -op Vital signs reviewed and stable  Post vital signs: Reviewed and stable  Complications: No apparent anesthesia complications

## 2013-08-24 NOTE — Anesthesia Preprocedure Evaluation (Signed)
Anesthesia Evaluation  Patient identified by MRN, date of birth, ID band Patient awake    Reviewed: Allergy & Precautions, H&P , NPO status , Patient's Chart, lab work & pertinent test results, reviewed documented beta blocker date and time   Airway Mallampati: II TM Distance: >3 FB     Dental  (+) Edentulous Upper, Edentulous Lower   Pulmonary shortness of breath, sleep apnea , Current Smoker,  breath sounds clear to auscultation        Cardiovascular hypertension, Pt. on home beta blockers and Pt. on medications + CAD, + Past MI, + Cardiac Stents and + Peripheral Vascular Disease + Valvular Problems/Murmurs MR Rhythm:Regular Rate:Normal     Neuro/Psych PSYCHIATRIC DISORDERS Anxiety Depression    GI/Hepatic negative GI ROS,   Endo/Other  diabetes, Type 2, Oral Hypoglycemic AgentsHypothyroidism   Renal/GU      Musculoskeletal   Abdominal   Peds  Hematology   Anesthesia Other Findings   Reproductive/Obstetrics                           Anesthesia Physical Anesthesia Plan  ASA: III  Anesthesia Plan: MAC   Post-op Pain Management:    Induction: Intravenous  Airway Management Planned: Nasal Cannula  Additional Equipment:   Intra-op Plan:   Post-operative Plan:   Informed Consent: I have reviewed the patients History and Physical, chart, labs and discussed the procedure including the risks, benefits and alternatives for the proposed anesthesia with the patient or authorized representative who has indicated his/her understanding and acceptance.     Plan Discussed with:   Anesthesia Plan Comments: (1 amp D50 for CBG=58 preop.)        Anesthesia Quick Evaluation

## 2013-08-24 NOTE — H&P (Signed)
I have reviewed the H&P, the patient was re-examined, and I have identified no interval changes in medical condition and plan of care since the history and physical of record  

## 2013-08-24 NOTE — Op Note (Signed)
Date of Admission: 08/24/2013  Date of Surgery: 08/24/2013   Pre-Op Dx: Cataract Right Eye  Post-Op Dx: Combined Cataract Right  Eye,  Dx Code 366.19  Surgeon: Tonny Branch, M.D.  Assistants: None  Anesthesia: Topical with MAC  Indications: Painless, progressive loss of vision with compromise of daily activities.  Surgery: Cataract Extraction with Intraocular lens Implant Right Eye  Discription: The patient had dilating drops and viscous lidocaine placed into the Right eye in the pre-op holding area. After transfer to the operating room, a time out was performed. The patient was then prepped and draped. Beginning with a 31 degree blade a paracentesis port was made at the surgeon's 2 o'clock position. The anterior chamber was then filled with 1% non-preserved lidocaine. This was followed by filling the anterior chamber with Provisc.  A 2.58mm keratome blade was used to make a clear corneal incision at the temporal limbus.  A bent cystatome needle was used to create a continuous tear capsulotomy. Hydrodissection was performed with balanced salt solution on a Fine canula. The lens nucleus was then removed using the phacoemulsification handpiece. Residual cortex was removed with the I&A handpiece. The anterior chamber and capsular bag were refilled with Provisc. A posterior chamber intraocular lens was placed into the capsular bag with it's injector. The implant was positioned with the Kuglan hook. The Provisc was then removed from the anterior chamber and capsular bag with the I&A handpiece. Stromal hydration of the main incision and paracentesis port was performed with BSS on a Fine canula. The wounds were tested for leak which was negative. The patient tolerated the procedure well. There were no operative complications. The patient was then transferred to the recovery room in stable condition.  Complications: None  Specimen: None  EBL: None  Prosthetic device: Hoya iSert 250, power 20.0 D, SN  [id number].

## 2013-08-24 NOTE — Anesthesia Procedure Notes (Signed)
Procedure Name: MAC Date/Time: 08/24/2013 10:46 AM Performed by: Andree Elk, AMY A Pre-anesthesia Checklist: Timeout performed, Patient identified, Emergency Drugs available, Suction available and Patient being monitored Oxygen Delivery Method: Nasal cannula

## 2013-08-24 NOTE — Anesthesia Postprocedure Evaluation (Signed)
  Anesthesia Post-op Note  Patient: [name]  Procedure(s) Performed: Procedure(s) with comments: CATARACT EXTRACTION PHACO AND INTRAOCULAR LENS PLACEMENT (IOC) (Right) - CDE 8.68  Patient Location: Short Stay  Anesthesia Type:MAC  Level of Consciousness: awake, alert , oriented and patient cooperative  Airway and Oxygen Therapy: Patient Spontanous Breathing  Post-op Pain: none  Post-op Assessment: Post-op Vital signs reviewed, Patient's Cardiovascular Status Stable, Respiratory Function Stable, Patent Airway, No signs of Nausea or vomiting and Pain level controlled  Post-op Vital Signs: Reviewed and stable  Last Vitals:  Filed Vitals:   08/24/13 1040  BP: 131/59  Pulse:   Temp:   Resp: 12    Complications: No apparent anesthesia complications

## 2013-08-25 ENCOUNTER — Encounter (HOSPITAL_COMMUNITY): Payer: Self-pay | Admitting: Ophthalmology

## 2013-09-09 ENCOUNTER — Encounter: Payer: Self-pay | Admitting: Cardiology

## 2013-09-09 ENCOUNTER — Ambulatory Visit (INDEPENDENT_AMBULATORY_CARE_PROVIDER_SITE_OTHER): Payer: Medicare Other | Admitting: Cardiology

## 2013-09-09 VITALS — BP 126/71 | HR 55 | Ht 64.0 in | Wt 225.4 lb

## 2013-09-09 DIAGNOSIS — I10 Essential (primary) hypertension: Secondary | ICD-10-CM

## 2013-09-09 DIAGNOSIS — I080 Rheumatic disorders of both mitral and aortic valves: Secondary | ICD-10-CM

## 2013-09-09 DIAGNOSIS — I2510 Atherosclerotic heart disease of native coronary artery without angina pectoris: Secondary | ICD-10-CM

## 2013-09-09 NOTE — Assessment & Plan Note (Signed)
Mild to moderate. No change on examination. 

## 2013-09-09 NOTE — Assessment & Plan Note (Signed)
Blood pressure is normal today. 

## 2013-09-09 NOTE — Assessment & Plan Note (Signed)
Symptomatically stable on medical therapy. Plan will be continue observation for now.

## 2013-09-09 NOTE — Patient Instructions (Signed)
Continue all current medications. Your physician wants you to follow up in: 6 months.  You will receive a reminder letter in the mail one-two months in advance.  If you don't receive a letter, please call our office to schedule the follow up appointment   

## 2013-09-09 NOTE — Progress Notes (Signed)
Clinical Summary Ms. Pearcy is a medically complex 68 y.o.female last seen in November 2014. She reports chronic problems with her "nerves" as well as difficulty sleeping. She is working with Dr. Pleas Koch on this. From a cardiac perspective, she has had no significant angina or progressive shortness of breath, remains relatively inactive. Reports compliance with her medications.  Lab work from April of this year showed BUN 10, creatinine 0.6, potassium 3.1.  Last ischemic evaluation was via [place] in December 2011 demonstrating no diagnostic ST segment abnormalities, with evidence of anteroapical ischemia and basal inferolateral ischemia suggestive of graft disease, LVEF 69%. Cardiac catheterization done that year is detailed below, showing evidence of graft disease. Her last intervention was DES to the circumflex in January of 2011.   No Known Allergies  Current Outpatient Prescriptions  Medication Sig Dispense Refill  . alprazolam (XANAX) 2 MG tablet Take 2 mg by mouth at bedtime as needed. Anxiety.      Marland Kitchen amLODipine (NORVASC) 5 MG tablet Take 5 mg by mouth daily.        Marland Kitchen aspirin 81 MG tablet Take 81 mg by mouth daily.        . clopidogrel (PLAVIX) 75 MG tablet Take 75 mg by mouth daily.        . fluvoxaMINE (LUVOX) 50 MG tablet Take 50 mg by mouth at bedtime.      . furosemide (LASIX) 40 MG tablet Take 40 mg by mouth daily.       Marland Kitchen glyBURIDE (DIABETA) 5 MG tablet Take 10 mg by mouth 2 (two) times daily.       . hydrOXYzine (ATARAX/VISTARIL) 25 MG tablet Take 25 mg by mouth 3 (three) times daily as needed. Ocassionally for allergies      . isosorbide mononitrate (IMDUR) 60 MG 24 hr tablet Take 1 tablet (60 mg total) by mouth daily.  30 tablet  3  . metFORMIN (GLUCOPHAGE) 500 MG tablet Take 1,000 mg by mouth 2 (two) times daily with a meal.       . metoprolol (LOPRESSOR) 50 MG tablet Take 50 mg by mouth 2 (two) times daily.       . nitroGLYCERIN (NITROSTAT) 0.4 MG SL  tablet Place 1 tablet (0.4 mg total) under the tongue every 5 (five) minutes as needed.  90 tablet  3  . oxyCODONE-acetaminophen (PERCOCET) 7.5-325 MG per tablet Take 1 tablet by mouth every 8 (eight) hours as needed. Pain.      . potassium chloride (K-DUR) 10 MEQ tablet Take 10 mEq by mouth daily.       . pravastatin (PRAVACHOL) 80 MG tablet Take 80 mg by mouth daily.         No current facility-administered medications for this visit.    Past Medical History  Diagnosis Date  . Mitral regurgitation     Mild to moderate  . Type 2 diabetes mellitus   . Obesity   . PAD (peripheral artery disease)   . Acute ischemic colitis 09/11/2005  . Coronary atherosclerosis of native coronary artery     Mulitvessel LVEF 50-50%, DES circ 1/11, occluded SVG to OM and SVG to diagonal , LVEF 60%  . Mixed hyperlipidemia   . Essential hypertension, benign   . Orthostatic hypotension   . Anxiety   . Depression   . Hypothyroidism   . PVC's (premature ventricular contractions)   . Tubular adenoma   . Diverticulosis of colon   . Atherosclerotic vascular disease  Calcified plaque at the origin of the celiac and SMA  . Mesenteric ischemia   . Schizophrenia   . Arthritis   . Myocardial infarction 1990  . Sleep apnea     CPAP    Past Surgical History  Procedure Laterality Date  . Neck surgery      Cervical laminectomy  . Carpal tunnel release      Bilateral  . Partial hysterectomy    . Back surgery      Lumbar spine surgery  . Coronary artery bypass graft      LIMA-LAD; SVG-OM; SVG-DX in [place]  . Carotid endarectomy Bilateral   . Colonoscopy  09/14/2005    Dr. Leonard Schwartz colitis  . Colonoscopy with esophagogastroduodenoscopy (egd)  04/14/2012    Dr. Gala Romney- EGD= gastric erosions of doubtful clinical significance per bx- chronic inactive gastritis, benign small bowel mucosa. TCS=colonic diverticulosis, tubular adenoma  . Cataract extraction w/phaco Right 08/24/2013    Procedure:  CATARACT EXTRACTION PHACO AND INTRAOCULAR LENS PLACEMENT (IOC);  Surgeon: Tonny Branch, MD;  Location: [facility];  Service: Ophthalmology;  Laterality: Right;  CDE 8.68    Social History Ms. Zobrist reports that she has been smoking Cigarettes.  She has a 25 pack-year smoking history. She has never used smokeless tobacco. Ms. Rubendall reports that she does not drink alcohol.  Review of Systems No palpitations or syncope. Problems with diarrhea. Fair appetite. Otherwise as outlined.  Physical Examination Filed Vitals:   09/09/13 1359  BP: 126/71  Pulse: 55   Filed Weights   09/09/13 1359  Weight: 225 lb 6.4 oz (102.241 kg)    Obese, chronically ill-appearing woman in no acute distress.  HEENT: Conjunctiva and lids are grossly normal, oropharynx with poor dentition.  Neck: Supple, no obvious elevation in JVP.  Lungs: Diminished breath sounds without wheezing or labored breathing.  Cardiac: Regular rate and rhythm, no S3, soft systolic murmur.  Abdomen: Soft, nontender, bowel sounds present  Skin: Warm and dry.  Extremities: No pitting edema, distal pulses one plus.  Musculoskeletal: No kyphosis.  Neuropsychiatric: Alert and oriented x3. Affect appropriate.   Problem List and Plan   CAD, NATIVE VESSEL Symptomatically stable on medical therapy. Plan will be continue observation for now.  Essential hypertension, benign Blood pressure is normal today.  MITRAL REGURGITATION Mild to moderate. No change on examination.    Satira Sark, M.D., F.A.C.C.

## 2014-03-23 ENCOUNTER — Encounter: Payer: Self-pay | Admitting: Cardiology

## 2014-03-23 ENCOUNTER — Ambulatory Visit (INDEPENDENT_AMBULATORY_CARE_PROVIDER_SITE_OTHER): Payer: Medicare Other | Admitting: Cardiology

## 2014-03-23 VITALS — BP 107/70 | HR 62 | Ht 64.0 in | Wt 225.0 lb

## 2014-03-23 DIAGNOSIS — I10 Essential (primary) hypertension: Secondary | ICD-10-CM

## 2014-03-23 MED ORDER — AMLODIPINE BESYLATE 2.5 MG PO TABS: 2.5000 mg | ORAL_TABLET | Freq: Every day | ORAL | Status: DC

## 2014-03-23 NOTE — Assessment & Plan Note (Signed)
No active angina symptoms with multivessel and graft disease. Plan is to continue medical therapy and observation at this time.

## 2014-03-23 NOTE — Assessment & Plan Note (Signed)
Mild by most recent echocardiogram.

## 2014-03-23 NOTE — Progress Notes (Signed)
Reason for visit: CAD, hypertension, mitral regurgitation  Clinical Summary [name] is a medically complex 68 y.o.female last seen in May. She presents for a routine visit. Reports no angina, but feels chronically fatigued. She has not been exercising. I reviewed her medications. Last lab work with Dr. Pleas Koch showed low potassium with normal renal function.  Last ischemic evaluation was via [place] in December 2011 demonstrating no diagnostic ST segment abnormalities, with evidence of anteroapical ischemia and basal inferolateral ischemia suggestive of graft disease, LVEF 69%. Cardiac catheterization done that year is detailed below, showing evidence of graft disease. Her last intervention was DES to the circumflex in January of 2011.  Echocardiogram from 2013 reported mild LVH with LVEF 18%, grade 1 diastolic dysfunction, mild mitral regurgitation, mild biatrial enlargement, mild tricuspid regurgitation.  No Known Allergies  Current Outpatient Prescriptions  Medication Sig Dispense Refill  . alprazolam (XANAX) 2 MG tablet Take 2 mg by mouth at bedtime as needed. Anxiety.    Marland Kitchen aspirin 81 MG tablet Take 81 mg by mouth daily.      . clopidogrel (PLAVIX) 75 MG tablet Take 75 mg by mouth daily.      . fluticasone (FLONASE) 50 MCG/ACT nasal spray Place 2 sprays into both nostrils daily.    . furosemide (LASIX) 40 MG tablet Take 40 mg by mouth daily.     Marland Kitchen glyBURIDE (DIABETA) 5 MG tablet Take 10 mg by mouth 2 (two) times daily.     . isosorbide mononitrate (IMDUR) 60 MG 24 hr tablet Take 1 tablet (60 mg total) by mouth daily. 30 tablet 3  . metFORMIN (GLUCOPHAGE) 500 MG tablet Take 1,000 mg by mouth 2 (two) times daily with a meal.     . metoprolol (LOPRESSOR) 50 MG tablet Take 50 mg by mouth 2 (two) times daily.     . nitroGLYCERIN (NITROSTAT) 0.4 MG SL tablet Place 1 tablet (0.4 mg total) under the tongue every 5 (five) minutes as needed. 90 tablet 3  . oxyCODONE-acetaminophen  (PERCOCET) 7.5-325 MG per tablet Take 1 tablet by mouth every 8 (eight) hours as needed. Pain.    . potassium chloride (K-DUR) 10 MEQ tablet Take 10 mEq by mouth daily.     . pravastatin (PRAVACHOL) 80 MG tablet Take 80 mg by mouth daily.      Marland Kitchen venlafaxine XR (EFFEXOR-XR) 75 MG 24 hr capsule Take 75 mg by mouth 2 (two) times daily.    Marland Kitchen amLODipine (NORVASC) 2.5 MG tablet Take 1 tablet (2.5 mg total) by mouth daily. 90 tablet 3  . fluvoxaMINE (LUVOX) 50 MG tablet Take 50 mg by mouth at bedtime.    . hydrOXYzine (ATARAX/VISTARIL) 25 MG tablet Take 25 mg by mouth 3 (three) times daily as needed. Ocassionally for allergies     No current facility-administered medications for this visit.    Past Medical History  Diagnosis Date  . Mitral regurgitation     Mild to moderate  . Type 2 diabetes mellitus   . Obesity   . PAD (peripheral artery disease)   . Acute ischemic colitis 09/11/2005  . Coronary atherosclerosis of native coronary artery     Mulitvessel LVEF 50-50%, DES circ 1/11, occluded SVG to OM and SVG to diagonal , LVEF 60%  . Mixed hyperlipidemia   . Essential hypertension, benign   . Orthostatic hypotension   . Anxiety   . Depression   . Hypothyroidism   . PVC's (premature ventricular contractions)   . Tubular  adenoma   . Diverticulosis of colon   . Atherosclerotic vascular disease     Calcified plaque at the origin of the celiac and SMA  . Mesenteric ischemia   . Schizophrenia   . Arthritis   . Myocardial infarction 1990  . Sleep apnea     CPAP    Past Surgical History  Procedure Laterality Date  . Neck surgery      Cervical laminectomy  . Carpal tunnel release      Bilateral  . Partial hysterectomy    . Back surgery      Lumbar spine surgery  . Coronary artery bypass graft      LIMA-LAD; SVG-OM; SVG-DX in [place]  . Carotid endarectomy Bilateral   . Colonoscopy  09/14/2005    Dr. Leonard Schwartz colitis  . Colonoscopy with esophagogastroduodenoscopy (egd)   04/14/2012    Dr. Gala Romney- EGD= gastric erosions of doubtful clinical significance per bx- chronic inactive gastritis, benign small bowel mucosa. TCS=colonic diverticulosis, tubular adenoma  . Cataract extraction w/phaco Right 08/24/2013    Procedure: CATARACT EXTRACTION PHACO AND INTRAOCULAR LENS PLACEMENT (IOC);  Surgeon: Tonny Branch, MD;  Location: [facility];  Service: Ophthalmology;  Laterality: Right;  CDE 8.68    Social History Ms. Petralia reports that she has been smoking Cigarettes.  She started smoking about 55 years ago. She has a 25 pack-year smoking history. She has never used smokeless tobacco. Ms. Nghiem reports that she does not drink alcohol.  Review of Systems Complete review of systems negative except as otherwise outlined in the clinical summary and also the following. No palpitations. Appetite is stable. Chronic back pain.  Physical Examination Filed Vitals:   03/23/14 1539  BP: 107/70  Pulse: 62   Filed Weights   03/23/14 1539  Weight: 225 lb (102.059 kg)    Obese, chronically ill-appearing woman in no acute distress.  HEENT: Conjunctiva and lids are grossly normal, oropharynx with poor dentition.  Neck: Supple, no obvious elevation in JVP.  Lungs: Diminished breath sounds without wheezing or labored breathing.  Cardiac: Regular rate and rhythm, no S3, soft systolic murmur.  Abdomen: Soft, nontender, bowel sounds present  Skin: Warm and dry.  Extremities: No pitting edema, distal pulses one plus.  Musculoskeletal: No kyphosis.    Problem List and Plan   CAD, NATIVE VESSEL No active angina symptoms with multivessel and graft disease. Plan is to continue medical therapy and observation at this time.  Essential hypertension, benign Low-normal blood pressure today. She reports chronic fatigue. Plan to reduce Norvasc to 2.5 mg daily to see if this helps. Also check BMET and magnesium with prior documentation of hypokalemia. She may need higher potassium  supplementation and perhaps even magnesium supplements.  MITRAL REGURGITATION Mild by most recent echocardiogram.    Satira Sark, M.D., F.A.C.C.

## 2014-03-23 NOTE — Assessment & Plan Note (Signed)
Low-normal blood pressure today. She reports chronic fatigue. Plan to reduce Norvasc to 2.5 mg daily to see if this helps. Also check BMET and magnesium with prior documentation of hypokalemia. She may need higher potassium supplementation and perhaps even magnesium supplements.

## 2014-03-23 NOTE — Patient Instructions (Signed)
Your physician recommends that you schedule a follow-up appointment in: 6 months. You will receive a reminder letter in the mail in about 4 months reminding you to call and schedule your appointment. If you don't receive this letter, please contact our office. Your physician has recommended you make the following change in your medication:  Decrease your amlodipine to 2.5 mg daily. You may break your 5 mg in half daily until they are finished. Continue all other medications the same. Your physician recommends that you have lab work to check your BMET and MG levels.

## 2014-03-24 ENCOUNTER — Telehealth: Payer: Self-pay | Admitting: *Deleted

## 2014-03-24 NOTE — Telephone Encounter (Signed)
-----   Message from Satira Sark, MD sent at 03/24/2014  2:20 PM EST ----- Reviewed. Please let her know that renal function and potassium actually are within normal range on current supplements. Magnesium is only mildly reduced, can get an over-the-counter supplement. No other changes for now.

## 2014-03-29 NOTE — Telephone Encounter (Signed)
Patient informed. 

## 2014-09-06 ENCOUNTER — Encounter: Payer: Self-pay | Admitting: Cardiology

## 2014-09-06 ENCOUNTER — Ambulatory Visit (INDEPENDENT_AMBULATORY_CARE_PROVIDER_SITE_OTHER): Payer: Medicare Other | Admitting: Cardiology

## 2014-09-06 ENCOUNTER — Encounter: Payer: Self-pay | Admitting: *Deleted

## 2014-09-06 VITALS — BP 127/76 | HR 68 | Ht 64.0 in | Wt 232.1 lb

## 2014-09-06 DIAGNOSIS — R002 Palpitations: Secondary | ICD-10-CM | POA: Diagnosis not present

## 2014-09-06 DIAGNOSIS — I10 Essential (primary) hypertension: Secondary | ICD-10-CM | POA: Diagnosis not present

## 2014-09-06 DIAGNOSIS — I2510 Atherosclerotic heart disease of native coronary artery without angina pectoris: Secondary | ICD-10-CM | POA: Diagnosis not present

## 2014-09-06 NOTE — Patient Instructions (Signed)
Your physician recommends that you continue on your current medications as directed. Please refer to the Current Medication list given to you today. Your physician recommends that you schedule a follow-up appointment in: 6 months. You will receive a reminder letter in the mail in about 4 months reminding you to call and schedule your appointment. If you don't receive this letter, please contact our office. 

## 2014-09-06 NOTE — Progress Notes (Signed)
Cardiology Office Note  Date: 09/06/2014   ID: [name], DOB 1945-07-05, MRN [id number]  PCP: Curlene Labrum, MD  Primary Cardiologist: Rozann Lesches, MD   Chief Complaint  Patient presents with  . Coronary Artery Disease  . Hyperlipidemia    History of Present Illness: [name] is a medically complex 69 y.o. female last seen in November 2015. She presents for a routine cardiology visit, denies any angina symptoms or increasing shortness of breath with typical ADLs. We reviewed her medications which are outlined below.  Last ischemic evaluation was via [place] in December 2011 demonstrating no diagnostic ST segment abnormalities, with evidence of anteroapical ischemia and basal inferolateral ischemia suggestive of graft disease, LVEF 69%. Cardiac catheterization done that year is detailed below, showing evidence of graft disease. Her last intervention was DES to the circumflex in January of 2011.  ECG today shows sinus rhythm with nonspecific ST changes and PACs.  She does report feeling "swollen" at times with some leg edema at the end of the day. States that she drinks fluids during the day, but does not urinate until the evening. She is on Lasix 40 mg daily which is been stable, and her last echocardiogram outlined below showed normal LVEF with grade 1 diastolic dysfunction.  She reports having had lab work with Dr. Pleas Koch since I last saw her.   Past Medical History  Diagnosis Date  . Mitral regurgitation     Mild to moderate  . Type 2 diabetes mellitus   . Obesity   . PAD (peripheral artery disease)   . Acute ischemic colitis 09/11/2005  . Coronary atherosclerosis of native coronary artery     Mulitvessel LVEF 50-50%, DES circ 1/11, occluded SVG to OM and SVG to diagonal , LVEF 60%  . Mixed hyperlipidemia   . Essential hypertension, benign   . Orthostatic hypotension   . Anxiety   . Depression   . Hypothyroidism   . PVC's (premature  ventricular contractions)   . Tubular adenoma   . Diverticulosis of colon   . Atherosclerotic vascular disease     Calcified plaque at the origin of the celiac and SMA  . Mesenteric ischemia   . Schizophrenia   . Arthritis   . Myocardial infarction 1990  . Sleep apnea     CPAP    Past Surgical History  Procedure Laterality Date  . Neck surgery      Cervical laminectomy  . Carpal tunnel release      Bilateral  . Partial hysterectomy    . Back surgery      Lumbar spine surgery  . Coronary artery bypass graft      LIMA-LAD; SVG-OM; SVG-DX in [place]  . Carotid endarectomy Bilateral   . Colonoscopy  09/14/2005    Dr. Leonard Schwartz colitis  . Colonoscopy with esophagogastroduodenoscopy (egd)  04/14/2012    Dr. Gala Romney- EGD= gastric erosions of doubtful clinical significance per bx- chronic inactive gastritis, benign small bowel mucosa. TCS=colonic diverticulosis, tubular adenoma  . Cataract extraction w/phaco Right 08/24/2013    Procedure: CATARACT EXTRACTION PHACO AND INTRAOCULAR LENS PLACEMENT (IOC);  Surgeon: Tonny Branch, MD;  Location: [facility];  Service: Ophthalmology;  Laterality: Right;  CDE 8.68    Current Outpatient Prescriptions  Medication Sig Dispense Refill  . alprazolam (XANAX) 2 MG tablet Take 2 mg by mouth at bedtime as needed. Anxiety.    Marland Kitchen amLODipine (NORVASC) 2.5 MG tablet Take 1 tablet (2.5 mg total) by mouth  daily. 90 tablet 3  . aspirin 81 MG tablet Take 81 mg by mouth daily.      . clopidogrel (PLAVIX) 75 MG tablet Take 75 mg by mouth daily.      . fluvoxaMINE (LUVOX) 50 MG tablet Take 50 mg by mouth at bedtime.    . furosemide (LASIX) 40 MG tablet Take 40 mg by mouth daily.     Marland Kitchen glyBURIDE (DIABETA) 5 MG tablet Take 10 mg by mouth 2 (two) times daily.     . isosorbide mononitrate (IMDUR) 60 MG 24 hr tablet Take 1 tablet (60 mg total) by mouth daily. 30 tablet 3  . metFORMIN (GLUCOPHAGE) 500 MG tablet Take 1,000 mg by mouth 2 (two) times daily with a meal.      . metoprolol (LOPRESSOR) 50 MG tablet Take 50 mg by mouth 2 (two) times daily.     . nitroGLYCERIN (NITROSTAT) 0.4 MG SL tablet Place 1 tablet (0.4 mg total) under the tongue every 5 (five) minutes as needed. 90 tablet 3  . oxyCODONE-acetaminophen (PERCOCET) 7.5-325 MG per tablet Take 1 tablet by mouth every 8 (eight) hours as needed. Pain.    . potassium chloride (K-DUR) 10 MEQ tablet Take 10 mEq by mouth daily.     . pravastatin (PRAVACHOL) 80 MG tablet Take 80 mg by mouth daily.       No current facility-administered medications for this visit.    Allergies:  Review of patient's allergies indicates no known allergies.   Social History: The patient  reports that she has been smoking Cigarettes.  She started smoking about 55 years ago. She has a 25 pack-year smoking history. She has never used smokeless tobacco. She reports that she does not drink alcohol or use illicit drugs.    ROS:  Please see the history of present illness. Otherwise, complete review of systems is positive for arthritic pains.  All other systems are reviewed and negative.   Physical Exam: VS:  BP 127/76 mmHg  Pulse 68  Ht 5\' 4"  (1.626 m)  Wt 232 lb 1.9 oz (105.289 kg)  BMI 39.82 kg/m2  SpO2 96%, BMI Body mass index is 39.82 kg/(m^2).  Wt Readings from Last 3 Encounters:  09/06/14 232 lb 1.9 oz (105.289 kg)  03/23/14 225 lb (102.059 kg)  09/09/13 225 lb 6.4 oz (102.241 kg)     Obese, chronically ill-appearing woman in no acute distress.  HEENT: Conjunctiva and lids are grossly normal, oropharynx with poor dentition.  Neck: Supple, no obvious elevation in JVP.  Lungs: Diminished breath sounds without wheezing or labored breathing.  Cardiac: Regular rate and rhythm, no S3, soft systolic murmur.  Abdomen: Soft, nontender, bowel sounds present  Skin: Warm and dry.  Extremities: No pitting edema, distal pulses one plus.  Musculoskeletal: No kyphosis.    ECG: ECG is ordered today.   Recent  Labwork:  03/23/2014: BUN 7, creatinine 0.6, potassium 4.1, magnesium 1.7  Other Studies Reviewed Today:  Echocardiogram 05/30/2011: Study Conclusions  - Left ventricle: The cavity size was normal. Wall thickness was increased in a pattern of mild LVH. The estimated ejection fraction was 55%. There is hypokinesis of the basalinferoseptal myocardium. Doppler parameters are consistent with abnormal left ventricular relaxation (grade 1 diastolic dysfunction). - Mitral valve: Calcified annulus. Mildly thickened leaflets . Mild regurgitation. - Left atrium: The atrium was mildly dilated. - Right atrium: The atrium was mildly dilated. - Atrial septum: The septum bowed from right to left, consistent with increased right atrial  pressure. - Tricuspid valve: Mild regurgitation. - Pericardium, extracardiac: There was no pericardial effusion.   Assessment and Plan:  1. Multivessel CAD and raft disease being managed medically, last intervention being DES to the circumflex in 2011. No changes made to current regimen.  2. Reports generalized feeling of edema and leg swelling at times, decreased frequency urination. Not clear that the answer is further advancing her Lasix, she might consider referral to urologist or gynecologist to make sure that she does not have bladder dysfunction. Will request her last lab work to make sure renal function is normal.  3. Essential hypertension, blood pressure is normal today.  Current medicines were reviewed with the patient today.   Orders Placed This Encounter  Procedures  . EKG 12-Lead    Disposition: FU with me in 6 months.   Signed, Satira Sark, MD, [facility] 09/06/2014 12:03 PM    [facility], [place], [place] [address] Phone: [phone]; Fax: [phone]

## 2014-12-06 DIAGNOSIS — I739 Peripheral vascular disease, unspecified: Secondary | ICD-10-CM | POA: Insufficient documentation

## 2015-03-15 ENCOUNTER — Encounter: Payer: Self-pay | Admitting: Cardiology

## 2015-03-15 ENCOUNTER — Encounter: Payer: Self-pay | Admitting: *Deleted

## 2015-03-15 ENCOUNTER — Ambulatory Visit (INDEPENDENT_AMBULATORY_CARE_PROVIDER_SITE_OTHER): Payer: Medicare Other | Admitting: Cardiology

## 2015-03-15 VITALS — BP 140/80 | HR 62 | Ht 64.0 in | Wt 226.6 lb

## 2015-03-15 DIAGNOSIS — I25119 Atherosclerotic heart disease of native coronary artery with unspecified angina pectoris: Secondary | ICD-10-CM | POA: Diagnosis not present

## 2015-03-15 DIAGNOSIS — Z720 Tobacco use: Secondary | ICD-10-CM

## 2015-03-15 DIAGNOSIS — E785 Hyperlipidemia, unspecified: Secondary | ICD-10-CM | POA: Diagnosis not present

## 2015-03-15 DIAGNOSIS — I080 Rheumatic disorders of both mitral and aortic valves: Secondary | ICD-10-CM | POA: Diagnosis not present

## 2015-03-15 DIAGNOSIS — I10 Essential (primary) hypertension: Secondary | ICD-10-CM

## 2015-03-15 NOTE — Progress Notes (Signed)
Cardiology Office Note  Date: 03/15/2015   ID: [name], DOB 05-23-1945, MRN [id number]  PCP: Curlene Labrum, MD  Primary Cardiologist: Rozann Lesches, MD   Chief Complaint  Patient presents with  . Coronary Artery Disease    History of Present Illness: [name] is a medically complex 69 y.o. female last seen in May. She presents for a routine cardiac follow-up visit. She does not report any progressing angina symptoms requiring nitroglycerin. Functional status is limited overall, she denies any regular exercise, also continues to smoke cigarettes.  She tells me that her previously described long-standing bouts with recurring diarrhea and abdominal discomfort have improved recently. She was referred by Dr. Pleas Koch to see Dr. Janet Berlin at [facility]) back in August for repeat evaluation regarding concerns for mesenteric ischemia - I reviewed the office note. Recommendation at that time was to consider a follow-up mesenteric duplex with office reassessment, although the patient canceled her visit and does not plan to go back.  She also had an echocardiogram done at [facility] on 11/22/2014, ordered by Dr. Pleas Koch. This study was read by Dr. [place] Capri. Report indicates mild LVH with LVEF 123456, diastolic dysfunction, mild left atrial enlargement, mildly sclerotic aortic valve, mild mitral regurgitation, RVSP is 26 mmHg, no pericardial effusion.   We have continued to manage her medically with a history of native vessel and graft disease, DES to the circumflex in January 2011. Angina symptoms have been stable so far.  We reviewed her cardiac medications outlined below, she reports compliance.   Past Medical History  Diagnosis Date  . Mitral regurgitation     Mild to moderate  . Type 2 diabetes mellitus ([place])   . Obesity   . PAD (peripheral artery disease) ([place])   . Acute ischemic colitis ([place]) 09/11/2005  . Coronary  atherosclerosis of native coronary artery     Mulitvessel LVEF 50-50%, DES circ 1/11, occluded SVG to OM and SVG to diagonal , LVEF 60%  . Mixed hyperlipidemia   . Essential hypertension, benign   . Orthostatic hypotension   . Anxiety   . Depression   . Hypothyroidism   . PVC's (premature ventricular contractions)   . Tubular adenoma   . Diverticulosis of colon   . Atherosclerotic vascular disease     Calcified plaque at the origin of the celiac and SMA  . Mesenteric ischemia ([place])   . Schizophrenia ([place])   . Arthritis   . Myocardial infarction ([place]) 1990  . Sleep apnea     CPAP    Past Surgical History  Procedure Laterality Date  . Neck surgery      Cervical laminectomy  . Carpal tunnel release      Bilateral  . Partial hysterectomy    . Back surgery      Lumbar spine surgery  . Coronary artery bypass graft      LIMA-LAD; SVG-OM; SVG-DX in [place]  . Carotid endarectomy Bilateral   . Colonoscopy  09/14/2005    Dr. Leonard Schwartz colitis  . Colonoscopy with esophagogastroduodenoscopy (egd)  04/14/2012    Dr. Gala Romney- EGD= gastric erosions of doubtful clinical significance per bx- chronic inactive gastritis, benign small bowel mucosa. TCS=colonic diverticulosis, tubular adenoma  . Cataract extraction w/phaco Right 08/24/2013    Procedure: CATARACT EXTRACTION PHACO AND INTRAOCULAR LENS PLACEMENT (IOC);  Surgeon: Tonny Branch, MD;  Location: [facility];  Service: Ophthalmology;  Laterality: Right;  CDE 8.68    Current Outpatient Prescriptions  Medication Sig Dispense Refill  . alprazolam (XANAX) 2 MG tablet Take 2 mg by mouth at bedtime as needed. Anxiety.    Marland Kitchen amLODipine (NORVASC) 2.5 MG tablet Take 1 tablet (2.5 mg total) by mouth daily. 90 tablet 3  . aspirin 81 MG tablet Take 81 mg by mouth daily.      . clopidogrel (PLAVIX) 75 MG tablet Take 75 mg by mouth daily.      . DULoxetine (CYMBALTA) 60 MG capsule Take 60 mg by mouth daily.    . fluvoxaMINE (LUVOX) 50 MG tablet  Take 50 mg by mouth at bedtime.    . furosemide (LASIX) 40 MG tablet Take 40 mg by mouth daily.     Marland Kitchen glyBURIDE (DIABETA) 5 MG tablet Take 10 mg by mouth 2 (two) times daily.     . isosorbide mononitrate (IMDUR) 60 MG 24 hr tablet Take 1 tablet (60 mg total) by mouth daily. 30 tablet 3  . metFORMIN (GLUCOPHAGE) 500 MG tablet Take 1,000 mg by mouth 2 (two) times daily with a meal.     . metoprolol (LOPRESSOR) 50 MG tablet Take 50 mg by mouth 2 (two) times daily.     . nitroGLYCERIN (NITROSTAT) 0.4 MG SL tablet Place 1 tablet (0.4 mg total) under the tongue every 5 (five) minutes as needed. 90 tablet 3  . oxyCODONE-acetaminophen (PERCOCET) 7.5-325 MG per tablet Take 1 tablet by mouth every 8 (eight) hours as needed. Pain.    . potassium chloride (K-DUR) 10 MEQ tablet Take 10 mEq by mouth daily.     . pravastatin (PRAVACHOL) 80 MG tablet Take 80 mg by mouth daily.       No current facility-administered medications for this visit.    Allergies:  Review of patient's allergies indicates no known allergies.   Social History: The patient  reports that she has been smoking Cigarettes.  She started smoking about 56 years ago. She has a 25 pack-year smoking history. She has never used smokeless tobacco. She reports that she does not drink alcohol or use illicit drugs.   ROS:  Please see the history of present illness. Otherwise, complete review of systems is positive for improved diarrhea and abdominal discomfort.  All other systems are reviewed and negative.   Physical Exam: VS:  BP 140/80 mmHg  Pulse 62  Ht 5\' 4"  (1.626 m)  Wt 226 lb 9.6 oz (102.785 kg)  BMI 38.88 kg/m2  SpO2 97%, BMI Body mass index is 38.88 kg/(m^2).  Wt Readings from Last 3 Encounters:  03/15/15 226 lb 9.6 oz (102.785 kg)  09/06/14 232 lb 1.9 oz (105.289 kg)  03/23/14 225 lb (102.059 kg)     Obese, chronically ill-appearing woman in no acute distress.  HEENT: Conjunctiva and lids are grossly normal, oropharynx with poor  dentition.  Neck: Supple, no obvious elevation in JVP.  Lungs: Diminished breath sounds without wheezing or labored breathing.  Cardiac: Regular rate and rhythm, no S3, soft systolic murmur.  Abdomen: Soft, nontender, bowel sounds present  Skin: Warm and dry.  Extremities: No pitting edema, distal pulses one plus.  Musculoskeletal: No kyphosis.  Neuropsychiatric: Alert and oriented 3, affect appropriate.   ECG: ECG is not ordered today.  Other Studies Reviewed Today:  Echocardiogram 05/30/2011: Study Conclusions  - Left ventricle: The cavity size was normal. Wall thickness was increased in a pattern of mild LVH. The estimated ejection fraction was 55%. There is hypokinesis of the basalinferoseptal myocardium. Doppler parameters are consistent with abnormal  left ventricular relaxation (grade 1 diastolic dysfunction). - Mitral valve: Calcified annulus. Mildly thickened leaflets . Mild regurgitation. - Left atrium: The atrium was mildly dilated. - Right atrium: The atrium was mildly dilated. - Atrial septum: The septum bowed from right to left, consistent with increased right atrial pressure. - Tricuspid valve: Mild regurgitation. - Pericardium, extracardiac: There was no pericardial effusion.  Last ischemic evaluation was via [place] in December 2011 demonstrating no diagnostic ST segment abnormalities, with evidence of anteroapical ischemia and basal inferolateral ischemia suggestive of graft disease, LVEF 69%. Cardiac catheterization done that year showed evidence of graft disease. Her last intervention was DES to the circumflex in January of 2011.   Assessment and Plan:  1. Multivessel native and graft disease status post CABG and subsequent DES to the circumflex in 2011. She reports stable angina symptoms, no increasing nitroglycerin use. Plan is to continue medical therapy and observation for now. She had a recent echocardiogram showing  normal LVEF.  2. History of mild to moderate mitral regurgitation, only mild by her echocardiogram done back in July at [place].  3. Essential hypertension, no changes made to current regimen. Put pressure control is reasonable today. She continues on Norvasc and Lopressor.  4. Hyperlipidemia, on Pravachol. She follows with Dr. Pleas Koch for routine lab work.  5. Tobacco abuse. Smoking cessation has been discussed several times. She has not been able to quit.  Current medicines were reviewed with the patient today.  Disposition: FU with me in 6 months.   Signed, Satira Sark, MD, [facility] 03/15/2015 4:33 PM    [place] at [place], [place], [place] [address] Phone: [phone]; Fax: [phone]

## 2015-03-15 NOTE — Patient Instructions (Signed)
Your physician recommends that you continue on your current medications as directed. Please refer to the Current Medication list given to you today. Your physician recommends that you schedule a follow-up appointment in: 6 months. You will receive a reminder letter in the mail in about 4 months reminding you to call and schedule your appointment. If you don't receive this letter, please contact our office. 

## 2015-05-05 DIAGNOSIS — E782 Mixed hyperlipidemia: Secondary | ICD-10-CM | POA: Diagnosis not present

## 2015-05-05 DIAGNOSIS — E876 Hypokalemia: Secondary | ICD-10-CM | POA: Diagnosis not present

## 2015-05-05 DIAGNOSIS — E1165 Type 2 diabetes mellitus with hyperglycemia: Secondary | ICD-10-CM | POA: Diagnosis not present

## 2015-05-12 DIAGNOSIS — I252 Old myocardial infarction: Secondary | ICD-10-CM | POA: Diagnosis not present

## 2015-05-12 DIAGNOSIS — I2510 Atherosclerotic heart disease of native coronary artery without angina pectoris: Secondary | ICD-10-CM | POA: Diagnosis not present

## 2015-05-12 DIAGNOSIS — E782 Mixed hyperlipidemia: Secondary | ICD-10-CM | POA: Diagnosis not present

## 2015-05-12 DIAGNOSIS — E1165 Type 2 diabetes mellitus with hyperglycemia: Secondary | ICD-10-CM | POA: Diagnosis not present

## 2015-05-12 DIAGNOSIS — I10 Essential (primary) hypertension: Secondary | ICD-10-CM | POA: Diagnosis not present

## 2015-05-16 ENCOUNTER — Telehealth (HOSPITAL_COMMUNITY): Payer: Self-pay | Admitting: *Deleted

## 2015-06-23 DIAGNOSIS — E119 Type 2 diabetes mellitus without complications: Secondary | ICD-10-CM | POA: Diagnosis not present

## 2015-06-23 DIAGNOSIS — I10 Essential (primary) hypertension: Secondary | ICD-10-CM | POA: Diagnosis not present

## 2015-06-23 DIAGNOSIS — K5900 Constipation, unspecified: Secondary | ICD-10-CM | POA: Diagnosis not present

## 2015-06-27 ENCOUNTER — Ambulatory Visit: Payer: Self-pay | Admitting: Pediatrics

## 2015-06-28 ENCOUNTER — Encounter: Payer: Self-pay | Admitting: Pediatrics

## 2015-06-28 ENCOUNTER — Ambulatory Visit (HOSPITAL_COMMUNITY): Payer: Self-pay | Admitting: Psychiatry

## 2015-09-16 ENCOUNTER — Ambulatory Visit (INDEPENDENT_AMBULATORY_CARE_PROVIDER_SITE_OTHER): Payer: Medicare Other | Admitting: Cardiology

## 2015-09-16 ENCOUNTER — Encounter: Payer: Self-pay | Admitting: Cardiology

## 2015-09-16 VITALS — BP 122/75 | HR 64 | Ht 64.0 in | Wt 212.8 lb

## 2015-09-16 DIAGNOSIS — E785 Hyperlipidemia, unspecified: Secondary | ICD-10-CM

## 2015-09-16 DIAGNOSIS — I25119 Atherosclerotic heart disease of native coronary artery with unspecified angina pectoris: Secondary | ICD-10-CM

## 2015-09-16 DIAGNOSIS — I10 Essential (primary) hypertension: Secondary | ICD-10-CM

## 2015-09-16 DIAGNOSIS — I080 Rheumatic disorders of both mitral and aortic valves: Secondary | ICD-10-CM

## 2015-09-16 MED ORDER — ISOSORBIDE MONONITRATE ER 60 MG PO TB24: 60.0000 mg | ORAL_TABLET | ORAL | Status: DC

## 2015-09-16 NOTE — Patient Instructions (Signed)
Your physician has recommended you make the following change in your medication:  Increase isosorbide mononitrate (imdur) to 60 mg in the morning and 30 mg in the evening. Continue all other medications the same. Your physician recommends that you schedule a follow-up appointment in: 6 months. You will receive a reminder letter in the mail in about 4 months reminding you to call and schedule your appointment. If you don't receive this letter, please contact our office.

## 2015-09-16 NOTE — Progress Notes (Signed)
Cardiology Office Note  Date: 09/16/2015   ID: [name], DOB 1945/08/21, MRN [id number]  PCP: Curlene Labrum, MD  Primary Cardiologist: Rozann Lesches, MD   Chief Complaint  Patient presents with  . Coronary Artery Disease    History of Present Illness: [name] is a medically complex 70 y.o. female last seen in November 2016. She presents for a routine follow-up visit. Reports exertional fatigue and shortness of breath, no nitroglycerin use for angina however. I reviewed her medications which are outlined below in stable overall.  She states that she had a sleep study done about a month ago, plans to discuss the results with Dr. Pleas Koch next week.  She had an echocardiogram done at [facility] on 11/22/2014, ordered by Dr. Pleas Koch that indicated mild LVH with LVEF 123456, diastolic dysfunction, mild left atrial enlargement, mildly sclerotic aortic valve, mild mitral regurgitation, RVSP is 26 mmHg, no pericardial effusion.   ECG today shows sinus rhythm with nonspecific ST segment changes and anteroseptal Q waves.  Today we discussed her cardiac history. She has graft disease, last intervention was DES to the circumflex in January 2011 by Dr. Angelena Form. She has not had a recent follow-up stress test. We did discuss this today, however it would almost certainly be abnormal in light of her documented CAD and graft disease. We have settled on strategy of continuing observation with medical therapy adjustments. If symptoms escalate, a cardiac catheterization would probably be more useful in terms of further assessment.  Past Medical History  Diagnosis Date  . Mitral regurgitation     Mild to moderate  . Type 2 diabetes mellitus ([place])   . Obesity   . PAD (peripheral artery disease) ([place])   . Acute ischemic colitis ([place]) 09/11/2005  . Coronary atherosclerosis of native coronary artery     Mulitvessel LVEF 50-50%, DES circ 1/11, occluded SVG to OM and SVG to diagonal ,  LVEF 60%  . Mixed hyperlipidemia   . Essential hypertension, benign   . Orthostatic hypotension   . Anxiety   . Depression   . Hypothyroidism   . PVC's (premature ventricular contractions)   . Tubular adenoma   . Diverticulosis of colon   . Atherosclerotic vascular disease     Calcified plaque at the origin of the celiac and SMA  . Mesenteric ischemia ([place])   . Schizophrenia ([place])   . Arthritis   . Myocardial infarction ([place]) 1990  . Sleep apnea     CPAP    Past Surgical History  Procedure Laterality Date  . Neck surgery      Cervical laminectomy  . Carpal tunnel release      Bilateral  . Partial hysterectomy    . Back surgery      Lumbar spine surgery  . Coronary artery bypass graft      LIMA-LAD; SVG-OM; SVG-DX in [place]  . Carotid endarectomy Bilateral   . Colonoscopy  09/14/2005    Dr. Leonard Schwartz colitis  . Colonoscopy with esophagogastroduodenoscopy (egd)  04/14/2012    Dr. Gala Romney- EGD= gastric erosions of doubtful clinical significance per bx- chronic inactive gastritis, benign small bowel mucosa. TCS=colonic diverticulosis, tubular adenoma  . Cataract extraction w/phaco Right 08/24/2013    Procedure: CATARACT EXTRACTION PHACO AND INTRAOCULAR LENS PLACEMENT (IOC);  Surgeon: Tonny Branch, MD;  Location: [facility];  Service: Ophthalmology;  Laterality: Right;  CDE 8.68    Current Outpatient Prescriptions  Medication Sig Dispense Refill  . alprazolam (XANAX) 2 MG tablet  Take 2 mg by mouth at bedtime as needed. Anxiety.    Marland Kitchen amLODipine (NORVASC) 5 MG tablet Take 5 mg by mouth daily.    Marland Kitchen aspirin 81 MG tablet Take 81 mg by mouth daily.      . clopidogrel (PLAVIX) 75 MG tablet Take 75 mg by mouth daily.      Marland Kitchen glipiZIDE (GLUCOTROL XL) 10 MG 24 hr tablet Take 10 mg by mouth 2 (two) times daily.    . isosorbide mononitrate (IMDUR) 60 MG 24 hr tablet Take 1 tablet (60 mg total) by mouth every morning. & 30 mg in the evening 135 tablet 3  . metFORMIN (GLUCOPHAGE) 500 MG  tablet Take 1,000 mg by mouth 2 (two) times daily with a meal.     . metoprolol (LOPRESSOR) 50 MG tablet Take 50 mg by mouth 2 (two) times daily.     . nitroGLYCERIN (NITROSTAT) 0.4 MG SL tablet Place 1 tablet (0.4 mg total) under the tongue every 5 (five) minutes as needed. 90 tablet 3  . oxyCODONE-acetaminophen (PERCOCET) 7.5-325 MG per tablet Take 1 tablet by mouth every 8 (eight) hours as needed. Pain.    . pravastatin (PRAVACHOL) 80 MG tablet Take 80 mg by mouth daily.      Marland Kitchen torsemide (DEMADEX) 20 MG tablet Take 40 mg by mouth as needed.    . zolpidem (AMBIEN CR) 12.5 MG CR tablet Take 12.5 mg by mouth at bedtime as needed for sleep.     No current facility-administered medications for this visit.   Allergies:  Review of patient's allergies indicates no known allergies.   Social History: The patient  reports that she has been smoking Cigarettes.  She started smoking about 56 years ago. She has a 25 pack-year smoking history. She has never used smokeless tobacco. She reports that she does not drink alcohol or use illicit drugs.   ROS:  Please see the history of present illness. Otherwise, complete review of systems is positive for chronic fatigue.  All other systems are reviewed and negative.   Physical Exam: VS:  BP 122/75 mmHg  Pulse 64  Ht 5\' 4"  (1.626 m)  Wt 212 lb 12.8 oz (96.525 kg)  BMI 36.51 kg/m2  SpO2 98%, BMI Body mass index is 36.51 kg/(m^2).  Wt Readings from Last 3 Encounters:  09/16/15 212 lb 12.8 oz (96.525 kg)  03/15/15 226 lb 9.6 oz (102.785 kg)  09/06/14 232 lb 1.9 oz (105.289 kg)    Obese, chronically ill-appearing woman in no acute distress.  HEENT: Conjunctiva and lids are grossly normal, oropharynx with poor dentition.  Neck: Supple, no obvious elevation in JVP.  Lungs: Diminished breath sounds without wheezing or labored breathing.  Cardiac: Regular rate and rhythm, no S3, soft systolic murmur.  Abdomen: Soft, nontender, bowel sounds present   Skin: Warm and dry.  Extremities: No pitting edema, distal pulses one plus.  Musculoskeletal: No kyphosis.  Neuropsychiatric: Alert and oriented 3, affect appropriate.  ECG: I personally reviewed the prior tracing from 09/06/2014 which showed sinus rhythm with occasional PAC and nonspecific ST changes.  Recent Labwork:   July 2016: Cholesterol 124, triglycerides 126, HDL 39, LDL 60, hemoglobin 13.1, platelets 196, TSH 4.4, BUN 11, creatinine 0.9, potassium 3.5, AST 17, ALT 11, hemoglobin A1c 6.1  Other Studies Reviewed Today:  Last ischemic evaluation was via [place] in December 2011 demonstrating no diagnostic ST segment abnormalities, with evidence of anteroapical ischemia and basal inferolateral ischemia suggestive of graft disease, LVEF  69%. Cardiac catheterization done that year showed evidence of graft disease. Her last intervention was DES to the circumflex in January of 2011.  Assessment and Plan:  1. Multivessel CAD with graft disease, last intervention being DES to the circumflex in January 2011. As discussed above we are going to continue with medical therapy, increase Imdur to 60 mg the morning and 30 mg in the evening. If symptoms further escalate, a cardiac catheterization can be discussed for assessment of revascularization options - these may be limited.  2. Essential hypertension, blood pressure is well controlled today.  3. Hyperlipidemia, LDL 60 last year on Pravachol.  4. Mitral regurgitation, mild by follow-up echocardiogram last year at [facility].  Current medicines were reviewed with the patient today.   Orders Placed This Encounter  Procedures  . EKG 12-Lead    Disposition: FU with me in 6 months.   Signed, Satira Sark, MD, [facility] 09/16/2015 5:02 PM    [facility] at [place], [place], [place] [address] Phone: [phone]; Fax: [phone]

## 2016-03-07 NOTE — Progress Notes (Signed)
Cardiology Office Note  Date: 03/08/2016   ID: [name], DOB 02-Nov-1945, MRN [id number]  PCP: Curlene Labrum, MD  Primary Cardiologist: Rozann Lesches, MD   Chief Complaint  Patient presents with  . Coronary Artery Disease    History of Present Illness: [name] is a 70 y.o. female last seen in May. She presents for a routine follow-up visit. She does not report any decline in cardiac symptoms since last visit. In talking with her it sounds like she may have had a stress test at [facility] a few months ago, I do not have the details but we are checking on this.  Last ischemic evaluation was via [place] in December 2011 demonstrating no diagnostic ST segment abnormalities, with evidence of anteroapical ischemia and basal inferolateral ischemia suggestive of graft disease, LVEF 69%. Cardiac catheterization done that year showed evidence of graft disease. Her last intervention was DES to the circumflex in January of 2011.  At the last visit we increased her Imdur dose. She has done well in terms of angina control, no other changes in her baseline cardiac regimen as outlined below.  She has been trying to cut back on smoking, has not been able to quit.  Past Medical History:  Diagnosis Date  . Acute ischemic colitis ([place]) 09/11/2005  . Anxiety   . Arthritis   . Atherosclerotic vascular disease    Calcified plaque at the origin of the celiac and SMA  . Coronary atherosclerosis of native coronary artery    Mulitvessel LVEF 50-50%, DES circ 1/11, occluded SVG to OM and SVG to diagonal , LVEF 60%  . Depression   . Diverticulosis of colon   . Essential hypertension, benign   . Hypothyroidism   . Mesenteric ischemia ([place])   . Mitral regurgitation    Mild to moderate  . Mixed hyperlipidemia   . Myocardial infarction 1990  . Obesity   . Orthostatic hypotension   . PAD (peripheral artery disease) ([place])   . PVC's (premature ventricular contractions)     . Schizophrenia ([place])   . Sleep apnea    CPAP  . Tubular adenoma   . Type 2 diabetes mellitus ([place])     Past Surgical History:  Procedure Laterality Date  . BACK SURGERY     Lumbar spine surgery  . Carotid endarectomy Bilateral   . CARPAL TUNNEL RELEASE     Bilateral  . CATARACT EXTRACTION W/PHACO Right 08/24/2013   Procedure: CATARACT EXTRACTION PHACO AND INTRAOCULAR LENS PLACEMENT (IOC);  Surgeon: Tonny Branch, MD;  Location: [facility];  Service: Ophthalmology;  Laterality: Right;  CDE 8.68  . COLONOSCOPY  09/14/2005   Dr. Leonard Schwartz colitis  . COLONOSCOPY WITH ESOPHAGOGASTRODUODENOSCOPY (EGD)  04/14/2012   Dr. Gala Romney- EGD= gastric erosions of doubtful clinical significance per bx- chronic inactive gastritis, benign small bowel mucosa. TCS=colonic diverticulosis, tubular adenoma  . CORONARY ARTERY BYPASS GRAFT     LIMA-LAD; SVG-OM; SVG-DX in 1995 [place]  . NECK SURGERY     Cervical laminectomy  . PARTIAL HYSTERECTOMY      Current Outpatient Prescriptions  Medication Sig Dispense Refill  . alprazolam (XANAX) 2 MG tablet Take 2 mg by mouth at bedtime as needed. Anxiety.    Marland Kitchen amLODipine (NORVASC) 5 MG tablet Take 5 mg by mouth daily.    Marland Kitchen aspirin 81 MG tablet Take 81 mg by mouth daily.      . clopidogrel (PLAVIX) 75 MG tablet Take 75 mg by mouth daily.      Marland Kitchen  glipiZIDE (GLUCOTROL XL) 10 MG 24 hr tablet Take 10 mg by mouth 2 (two) times daily.    . isosorbide mononitrate (IMDUR) 60 MG 24 hr tablet Take 1 tablet (60 mg total) by mouth every morning. & 30 mg in the evening 135 tablet 3  . metFORMIN (GLUCOPHAGE) 500 MG tablet Take 1,000 mg by mouth 2 (two) times daily with a meal.     . metoprolol (LOPRESSOR) 50 MG tablet Take 50 mg by mouth 2 (two) times daily.     . nitroGLYCERIN (NITROSTAT) 0.4 MG SL tablet Place 1 tablet (0.4 mg total) under the tongue every 5 (five) minutes as needed. 90 tablet 3  . oxyCODONE-acetaminophen (PERCOCET) 7.5-325 MG per tablet Take 1 tablet by mouth  every 8 (eight) hours as needed. Pain.    . pravastatin (PRAVACHOL) 80 MG tablet Take 80 mg by mouth daily.      Marland Kitchen torsemide (DEMADEX) 20 MG tablet Take 40 mg by mouth as needed.    . zolpidem (AMBIEN CR) 12.5 MG CR tablet Take 12.5 mg by mouth at bedtime as needed for sleep.     No current facility-administered medications for this visit.    Allergies:  Patient has no known allergies.   Social History: The patient  reports that she has been smoking Cigarettes.  She started smoking about 57 years ago. She has a 25.00 pack-year smoking history. She has never used smokeless tobacco. She reports that she does not drink alcohol or use drugs.   ROS:  Please see the history of present illness. Otherwise, complete review of systems is positive for improved energy after B-12 injections.  All other systems are reviewed and negative.   Physical Exam: VS:  BP 120/67   Pulse 61   Ht 5\' 4"  (1.626 m)   Wt 206 lb 12.8 oz (93.8 kg)   SpO2 93%   BMI 35.50 kg/m , BMI Body mass index is 35.5 kg/m.  Wt Readings from Last 3 Encounters:  03/08/16 206 lb 12.8 oz (93.8 kg)  09/16/15 212 lb 12.8 oz (96.5 kg)  03/15/15 226 lb 9.6 oz (102.8 kg)    Obese, chronically ill-appearing woman in no acute distress.  HEENT: Conjunctiva and lids are grossly normal, oropharynx with poor dentition.  Neck: Supple, no obvious elevation in JVP.  Lungs: Diminished breath sounds without wheezing or labored breathing.  Cardiac: Regular rate and rhythm, no S3, soft systolic murmur.  Abdomen: Soft, nontender, bowel sounds present  Skin: Warm and dry.  Extremities: No pitting edema, distal pulses one plus.  Musculoskeletal: No kyphosis.  Neuropsychiatric: Alert and oriented 3, affect appropriate.  ECG: I personally reviewed the tracing from 5/19/317 which showed sinus rhythm with nonspecific ST changes and anteroseptal Q waves.  Recent Labwork:  July 2016: Cholesterol 124, triglycerides 126, HDL 39, LDL 60,  hemoglobin 13.1, platelets 196, TSH 4.4, BUN 11, creatinine 0.9, potassium 3.5, AST 17, ALT 11, hemoglobin A1c 6.1  Assessment and Plan:  1. Multivessel CAD status post CABG with documented graft disease as outlined. We plan to continue medical therapy and observation, reserving invasive evaluation for escalating symptoms. We will investigate whether she had a stress test at [facility] and try to obtain the results.  2. Essential hypertension, blood pressure is well controlled today on current regimen.  3. Hyperlipidemia, she continues on Pravachol. LDL was 60 last year.  4. PVCs, previously documented. She has not been bothered by progressive palpitations or syncope.  Current medicines were reviewed with  the patient today.  Disposition: Follow-up in 6 months.  Signed, Satira Sark, MD, [facility] 03/08/2016 3:28 PM    [facility] at [place], [place], [place] [address] Phone: [phone]; Fax: [phone]

## 2016-03-08 ENCOUNTER — Encounter: Payer: Self-pay | Admitting: *Deleted

## 2016-03-08 ENCOUNTER — Ambulatory Visit (INDEPENDENT_AMBULATORY_CARE_PROVIDER_SITE_OTHER): Payer: Medicare Other | Admitting: Cardiology

## 2016-03-08 ENCOUNTER — Encounter: Payer: Self-pay | Admitting: Cardiology

## 2016-03-08 VITALS — BP 120/67 | HR 61 | Ht 64.0 in | Wt 206.8 lb

## 2016-03-08 DIAGNOSIS — I493 Ventricular premature depolarization: Secondary | ICD-10-CM | POA: Diagnosis not present

## 2016-03-08 DIAGNOSIS — I25119 Atherosclerotic heart disease of native coronary artery with unspecified angina pectoris: Secondary | ICD-10-CM

## 2016-03-08 DIAGNOSIS — I10 Essential (primary) hypertension: Secondary | ICD-10-CM | POA: Diagnosis not present

## 2016-03-08 DIAGNOSIS — E782 Mixed hyperlipidemia: Secondary | ICD-10-CM | POA: Diagnosis not present

## 2016-03-08 NOTE — Patient Instructions (Signed)

## 2016-03-14 ENCOUNTER — Encounter: Payer: Self-pay | Admitting: *Deleted

## 2016-08-17 ENCOUNTER — Other Ambulatory Visit: Payer: Self-pay | Admitting: Cardiology

## 2016-09-03 ENCOUNTER — Encounter: Payer: Self-pay | Admitting: *Deleted

## 2016-09-04 ENCOUNTER — Telehealth: Payer: Self-pay | Admitting: *Deleted

## 2016-09-04 ENCOUNTER — Encounter: Payer: Self-pay | Admitting: Cardiology

## 2016-09-04 ENCOUNTER — Ambulatory Visit (INDEPENDENT_AMBULATORY_CARE_PROVIDER_SITE_OTHER): Payer: Medicare Other | Admitting: Cardiology

## 2016-09-04 ENCOUNTER — Encounter: Payer: Self-pay | Admitting: *Deleted

## 2016-09-04 VITALS — BP 125/73 | HR 58 | Ht 64.0 in | Wt 202.4 lb

## 2016-09-04 DIAGNOSIS — I739 Peripheral vascular disease, unspecified: Secondary | ICD-10-CM

## 2016-09-04 DIAGNOSIS — R0989 Other specified symptoms and signs involving the circulatory and respiratory systems: Secondary | ICD-10-CM

## 2016-09-04 DIAGNOSIS — I779 Disorder of arteries and arterioles, unspecified: Secondary | ICD-10-CM | POA: Diagnosis not present

## 2016-09-04 DIAGNOSIS — E782 Mixed hyperlipidemia: Secondary | ICD-10-CM | POA: Diagnosis not present

## 2016-09-04 DIAGNOSIS — I25119 Atherosclerotic heart disease of native coronary artery with unspecified angina pectoris: Secondary | ICD-10-CM | POA: Diagnosis not present

## 2016-09-04 DIAGNOSIS — I10 Essential (primary) hypertension: Secondary | ICD-10-CM | POA: Diagnosis not present

## 2016-09-04 NOTE — Patient Instructions (Signed)

## 2016-09-04 NOTE — Progress Notes (Signed)
Cardiology Office Note  Date: 09/04/2016   ID: [name], DOB 08/24/1945, MRN [id number]  PCP: Curlene Labrum, MD  Primary Cardiologist: Rozann Lesches, MD   Chief Complaint  Patient presents with  . Coronary Artery Disease    History of Present Illness: [name] is a 71 y.o. female last seen in November 2017. She presents for a routine follow-up visit. Fortunately, she does not report any progressive angina symptoms on current medical regimen. She reports compliance with her medications. She tells me that she was hospitalized at [facility] sometime back in March with symptomatic hypoglycemia. She recalls having carotid Dopplers done during that stay and was told that she had some degree of blockage, results are being requested. She has a history of bilateral carotid endarterectomy.  Patient had a follow-up Myoview study done at [facility] last year, I have not been able to obtain complete results.  Current cardiac regimen includes aspirin, Plavix, Norvasc, Imdur, Lopressor, Pravachol, Demadex, and as needed nitroglycerin.  Past Medical History:  Diagnosis Date  . Acute ischemic colitis ([place]) 09/11/2005  . Anxiety   . Arthritis   . Atherosclerotic vascular disease    Calcified plaque at the origin of the celiac and SMA  . Coronary atherosclerosis of native coronary artery    Mulitvessel LVEF 50-50%, DES circ 1/11, occluded SVG to OM and SVG to diagonal , LVEF 60%  . Depression   . Diverticulosis of colon   . Essential hypertension, benign   . Hypothyroidism   . Mesenteric ischemia ([place])   . Mitral regurgitation    Mild to moderate  . Mixed hyperlipidemia   . Myocardial infarction ([place]) 1990  . Obesity   . Orthostatic hypotension   . PAD (peripheral artery disease) ([place])   . PVC's (premature ventricular contractions)   . Schizophrenia ([place])   . Sleep apnea    CPAP  . Tubular adenoma   . Type 2 diabetes mellitus ([place])     Past  Surgical History:  Procedure Laterality Date  . BACK SURGERY     Lumbar spine surgery  . Carotid endarectomy Bilateral   . CARPAL TUNNEL RELEASE     Bilateral  . CATARACT EXTRACTION W/PHACO Right 08/24/2013   Procedure: CATARACT EXTRACTION PHACO AND INTRAOCULAR LENS PLACEMENT (IOC);  Surgeon: Tonny Branch, MD;  Location: [facility];  Service: Ophthalmology;  Laterality: Right;  CDE 8.68  . COLONOSCOPY  09/14/2005   Dr. Leonard Schwartz colitis  . COLONOSCOPY WITH ESOPHAGOGASTRODUODENOSCOPY (EGD)  04/14/2012   Dr. Gala Romney- EGD= gastric erosions of doubtful clinical significance per bx- chronic inactive gastritis, benign small bowel mucosa. TCS=colonic diverticulosis, tubular adenoma  . CORONARY ARTERY BYPASS GRAFT     LIMA-LAD; SVG-OM; SVG-DX in 1995 [place]  . NECK SURGERY     Cervical laminectomy  . PARTIAL HYSTERECTOMY      Current Outpatient Prescriptions  Medication Sig Dispense Refill  . alprazolam (XANAX) 2 MG tablet Take 2 mg by mouth at bedtime as needed. Anxiety.    Marland Kitchen amLODipine (NORVASC) 5 MG tablet Take 5 mg by mouth daily.    Marland Kitchen aspirin 81 MG tablet Take 81 mg by mouth daily.      . clopidogrel (PLAVIX) 75 MG tablet Take 75 mg by mouth daily.      . isosorbide mononitrate (IMDUR) 60 MG 24 hr tablet TAKE 1 TABLET BY MOUTH IN  THE MORNING AND ONE-HALF  TABLET BY MOUTH IN THE  EVENING 135 tablet 0  .  metFORMIN (GLUCOPHAGE) 500 MG tablet Take 1,000 mg by mouth 2 (two) times daily with a meal.     . metoprolol (LOPRESSOR) 50 MG tablet Take 50 mg by mouth 2 (two) times daily.     . nitroGLYCERIN (NITROSTAT) 0.4 MG SL tablet Place 1 tablet (0.4 mg total) under the tongue every 5 (five) minutes as needed. 90 tablet 3  . oxyCODONE-acetaminophen (PERCOCET) 7.5-325 MG per tablet Take 1 tablet by mouth every 8 (eight) hours as needed. Pain.    . pravastatin (PRAVACHOL) 80 MG tablet Take 80 mg by mouth daily.      Marland Kitchen torsemide (DEMADEX) 20 MG tablet Take 40 mg by mouth as needed.     No current  facility-administered medications for this visit.    Allergies:  Patient has no known allergies.   Social History: The patient  reports that she has been smoking Cigarettes.  She started smoking about 57 years ago. She has a 25.00 pack-year smoking history. She has never used smokeless tobacco. She reports that she does not drink alcohol or use drugs.   ROS:  Please see the history of present illness. Otherwise, complete review of systems is positive for hearing loss.  All other systems are reviewed and negative.   Physical Exam: VS:  BP 125/73   Pulse (!) 58   Ht 5\' 4"  (1.626 m)   Wt 202 lb 6.4 oz (91.8 kg)   SpO2 97%   BMI 34.74 kg/m , BMI Body mass index is 34.74 kg/m.  Wt Readings from Last 3 Encounters:  09/04/16 202 lb 6.4 oz (91.8 kg)  03/08/16 206 lb 12.8 oz (93.8 kg)  09/16/15 212 lb 12.8 oz (96.5 kg)    Obese, chronically ill-appearing woman in no acute distress.  HEENT: Conjunctiva and lids are grossly normal, oropharynx with poor dentition.  Neck: Supple, no obvious elevation in JVP.  Lungs: Diminished breath sounds without wheezing or labored breathing.  Cardiac: Regular rate and rhythm, no S3, soft systolic murmur.  Abdomen: Soft, nontender, bowel sounds present  Skin: Warm and dry.  Extremities: No pitting edema, distal pulses one plus.  Musculoskeletal: No kyphosis.  Neuropsychiatric: Alert and oriented 3, affect appropriate.  ECG: I personally reviewed the tracing from 5/19/317 which showed sinus rhythm with nonspecific ST changes and anteroseptal Q waves.  Recent Labwork:  July 2016: Cholesterol 124, triglycerides 126, HDL 39, LDL 60, hemoglobin 13.1, platelets 196, TSH 4.4, BUN 11, creatinine 0.9, potassium 3.5, AST 17, ALT 11, hemoglobin A1c 6.1  Other Studies Reviewed Today:  Echocardiogram 11/22/2014 [facility]): Mild LVH with LVEF 67-67%, diastolic dysfunction (ungraded), mild left atrial enlargement, sclerotic aortic valve without stenosis,  mild mitral regurgitation, mitral annular calcification, RVSP 26 mmHg.  Assessment and Plan:  1. Multivessel CAD status post CABG with documented graft disease that is being managed medically. She does not report any progressive angina on current regimen. Continue with observation.  2. Carotid artery disease status post bilateral carotid endarterectomy. Plan to obtain most recent carotid Doppler studies from [facility]. She is on aspirin and statin.  3. Essential hypertension, systolic blood pressure in the 120s today. No changes were made.  4. Hyperlipidemia on Pravachol. She continues to follow with Dr. Pleas Koch.  Current medicines were reviewed with the patient today.  Disposition: Follow-up in 6 months.  Signed, Satira Sark, MD, [facility] 09/04/2016 2:06 PM    [facility] [place], [place], [place] [address] Phone: [phone])  [phone]; Fax: [phone]

## 2016-09-05 NOTE — Telephone Encounter (Signed)
Patient informed that she will need another carotid doppler in one year per Dr. Myles Gip disposition on report from 07/20/16.

## 2016-10-19 ENCOUNTER — Other Ambulatory Visit: Payer: Self-pay | Admitting: Cardiology

## 2016-10-23 DIAGNOSIS — E162 Hypoglycemia, unspecified: Secondary | ICD-10-CM | POA: Diagnosis not present

## 2016-10-23 DIAGNOSIS — E782 Mixed hyperlipidemia: Secondary | ICD-10-CM | POA: Diagnosis not present

## 2016-10-23 DIAGNOSIS — E876 Hypokalemia: Secondary | ICD-10-CM | POA: Diagnosis not present

## 2016-10-23 DIAGNOSIS — E870 Hyperosmolality and hypernatremia: Secondary | ICD-10-CM | POA: Diagnosis not present

## 2016-10-23 DIAGNOSIS — E1165 Type 2 diabetes mellitus with hyperglycemia: Secondary | ICD-10-CM | POA: Diagnosis not present

## 2016-10-25 DIAGNOSIS — E1165 Type 2 diabetes mellitus with hyperglycemia: Secondary | ICD-10-CM | POA: Diagnosis not present

## 2016-10-25 DIAGNOSIS — E782 Mixed hyperlipidemia: Secondary | ICD-10-CM | POA: Diagnosis not present

## 2016-10-25 DIAGNOSIS — E870 Hyperosmolality and hypernatremia: Secondary | ICD-10-CM | POA: Diagnosis not present

## 2016-10-27 DIAGNOSIS — G4733 Obstructive sleep apnea (adult) (pediatric): Secondary | ICD-10-CM | POA: Diagnosis not present

## 2016-11-01 DIAGNOSIS — H35033 Hypertensive retinopathy, bilateral: Secondary | ICD-10-CM | POA: Diagnosis not present

## 2016-11-01 DIAGNOSIS — H524 Presbyopia: Secondary | ICD-10-CM | POA: Diagnosis not present

## 2016-11-05 ENCOUNTER — Ambulatory Visit (INDEPENDENT_AMBULATORY_CARE_PROVIDER_SITE_OTHER): Payer: 59 | Admitting: Psychiatry

## 2016-11-05 ENCOUNTER — Encounter (HOSPITAL_COMMUNITY): Payer: Self-pay | Admitting: Psychiatry

## 2016-11-05 VITALS — BP 128/76 | HR 76 | Ht 64.0 in | Wt 202.0 lb

## 2016-11-05 DIAGNOSIS — Z811 Family history of alcohol abuse and dependence: Secondary | ICD-10-CM

## 2016-11-05 DIAGNOSIS — F331 Major depressive disorder, recurrent, moderate: Secondary | ICD-10-CM

## 2016-11-05 DIAGNOSIS — E785 Hyperlipidemia, unspecified: Secondary | ICD-10-CM

## 2016-11-05 DIAGNOSIS — F17210 Nicotine dependence, cigarettes, uncomplicated: Secondary | ICD-10-CM

## 2016-11-05 DIAGNOSIS — Z560 Unemployment, unspecified: Secondary | ICD-10-CM | POA: Diagnosis not present

## 2016-11-05 DIAGNOSIS — I2510 Atherosclerotic heart disease of native coronary artery without angina pectoris: Secondary | ICD-10-CM

## 2016-11-05 DIAGNOSIS — G4733 Obstructive sleep apnea (adult) (pediatric): Secondary | ICD-10-CM

## 2016-11-05 DIAGNOSIS — N189 Chronic kidney disease, unspecified: Secondary | ICD-10-CM | POA: Diagnosis not present

## 2016-11-05 DIAGNOSIS — I10 Essential (primary) hypertension: Secondary | ICD-10-CM

## 2016-11-05 DIAGNOSIS — F1011 Alcohol abuse, in remission: Secondary | ICD-10-CM

## 2016-11-05 DIAGNOSIS — K55059 Acute (reversible) ischemia of intestine, part and extent unspecified: Secondary | ICD-10-CM | POA: Diagnosis not present

## 2016-11-05 DIAGNOSIS — D369 Benign neoplasm, unspecified site: Secondary | ICD-10-CM

## 2016-11-05 DIAGNOSIS — G4700 Insomnia, unspecified: Secondary | ICD-10-CM

## 2016-11-05 MED ORDER — SERTRALINE HCL 50 MG PO TABS: ORAL_TABLET | ORAL | 1 refills | Status: DC

## 2016-11-05 NOTE — Patient Instructions (Addendum)
1. Start sertraline 25 mg daily for two weeks, then 50 mg daily 2. Consider decreasing the dose of Xanax  3. Return to clinic in one month for 30 mins
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
## 2016-11-06 DIAGNOSIS — L720 Epidermal cyst: Secondary | ICD-10-CM | POA: Diagnosis not present

## 2016-11-06 DIAGNOSIS — D485 Neoplasm of uncertain behavior of skin: Secondary | ICD-10-CM | POA: Diagnosis not present

## 2016-11-10 DIAGNOSIS — E1165 Type 2 diabetes mellitus with hyperglycemia: Secondary | ICD-10-CM | POA: Diagnosis not present

## 2016-11-26 DIAGNOSIS — G4733 Obstructive sleep apnea (adult) (pediatric): Secondary | ICD-10-CM | POA: Diagnosis not present
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
## 2016-12-03 ENCOUNTER — Ambulatory Visit (HOSPITAL_COMMUNITY): Payer: 59 | Admitting: Psychiatry

## 2016-12-27 DIAGNOSIS — G4733 Obstructive sleep apnea (adult) (pediatric): Secondary | ICD-10-CM | POA: Diagnosis not present

## 2016-12-28 DIAGNOSIS — E782 Mixed hyperlipidemia: Secondary | ICD-10-CM | POA: Diagnosis not present

## 2016-12-28 DIAGNOSIS — E162 Hypoglycemia, unspecified: Secondary | ICD-10-CM | POA: Diagnosis not present

## 2016-12-28 DIAGNOSIS — E876 Hypokalemia: Secondary | ICD-10-CM | POA: Diagnosis not present

## 2016-12-28 DIAGNOSIS — E1165 Type 2 diabetes mellitus with hyperglycemia: Secondary | ICD-10-CM | POA: Diagnosis not present

## 2016-12-28 DIAGNOSIS — E870 Hyperosmolality and hypernatremia: Secondary | ICD-10-CM | POA: Diagnosis not present

## 2017-01-04 DIAGNOSIS — I252 Old myocardial infarction: Secondary | ICD-10-CM | POA: Diagnosis not present

## 2017-01-04 DIAGNOSIS — I2510 Atherosclerotic heart disease of native coronary artery without angina pectoris: Secondary | ICD-10-CM | POA: Diagnosis not present

## 2017-01-04 DIAGNOSIS — I6521 Occlusion and stenosis of right carotid artery: Secondary | ICD-10-CM | POA: Diagnosis not present

## 2017-01-04 DIAGNOSIS — I10 Essential (primary) hypertension: Secondary | ICD-10-CM | POA: Diagnosis not present

## 2017-01-04 DIAGNOSIS — E1165 Type 2 diabetes mellitus with hyperglycemia: Secondary | ICD-10-CM | POA: Diagnosis not present

## 2017-01-07 DIAGNOSIS — R3915 Urgency of urination: Secondary | ICD-10-CM | POA: Diagnosis not present

## 2017-01-07 DIAGNOSIS — R3912 Poor urinary stream: Secondary | ICD-10-CM | POA: Diagnosis not present

## 2017-01-27 DIAGNOSIS — G4733 Obstructive sleep apnea (adult) (pediatric): Secondary | ICD-10-CM | POA: Diagnosis not present

## 2017-02-09 DIAGNOSIS — E1165 Type 2 diabetes mellitus with hyperglycemia: Secondary | ICD-10-CM | POA: Diagnosis not present

## 2017-02-21 DIAGNOSIS — R3912 Poor urinary stream: Secondary | ICD-10-CM | POA: Diagnosis not present

## 2017-02-21 DIAGNOSIS — R3915 Urgency of urination: Secondary | ICD-10-CM | POA: Diagnosis not present

## 2017-02-26 DIAGNOSIS — G4733 Obstructive sleep apnea (adult) (pediatric): Secondary | ICD-10-CM | POA: Diagnosis not present

## 2017-02-27 DIAGNOSIS — G4709 Other insomnia: Secondary | ICD-10-CM | POA: Diagnosis not present

## 2017-02-27 DIAGNOSIS — E328 Other diseases of thymus: Secondary | ICD-10-CM | POA: Diagnosis not present

## 2017-02-27 DIAGNOSIS — Z23 Encounter for immunization: Secondary | ICD-10-CM | POA: Diagnosis not present

## 2017-03-13 NOTE — Progress Notes (Signed)
Cardiology Office Note  Date: 03/14/2017   ID: [name], DOB 01/18/1946, MRN [id number]  PCP: Curlene Labrum, MD  Primary Cardiologist: Rozann Lesches, MD   Chief Complaint  Patient presents with  . Coronary Artery Disease    History of Present Illness: [name] is a 71 y.o. female last seen in May.  She presents for a routine follow-up visit.  Reports no angina symptoms.  Does have occasional palpitations but no prolonged events.  She states that she has been taking her medications regularly.  Carotid Dopplers from earlier in the year at [facility] are outlined below.  She had moderate bilateral ICA disease.  She continues on antiplatelet regimen and statin.  Close aspirin, Plavix, Norvasc, Imdur, Lopressor, Pravachol, Demadex, and as needed nitroglycerin.  I personally reviewed her ECG today which shows a sinus bradycardia, poor anteroseptal R wave progression, rule out old anteroseptal infarct pattern.  Past Medical History:  Diagnosis Date  . Acute ischemic colitis ([place]) 09/11/2005  . Anxiety   . Arthritis   . Atherosclerotic vascular disease    Calcified plaque at the origin of the celiac and SMA  . Coronary atherosclerosis of native coronary artery    Mulitvessel LVEF 50-50%, DES circ 1/11, occluded SVG to OM and SVG to diagonal , LVEF 60%  . Depression   . Diverticulosis of colon   . Essential hypertension, benign   . Hypothyroidism   . Mesenteric ischemia ([place])   . Mitral regurgitation    Mild to moderate  . Mixed hyperlipidemia   . Myocardial infarction ([place]) 1990  . Obesity   . Orthostatic hypotension   . PAD (peripheral artery disease) ([place])   . PVC's (premature ventricular contractions)   . Schizophrenia ([place])   . Sleep apnea    CPAP  . Tubular adenoma   . Type 2 diabetes mellitus ([place])     Past Surgical History:  Procedure Laterality Date  . BACK SURGERY     Lumbar spine surgery  . Carotid endarectomy  Bilateral   . CARPAL TUNNEL RELEASE     Bilateral  . CATARACT EXTRACTION W/PHACO Right 08/24/2013   Procedure: CATARACT EXTRACTION PHACO AND INTRAOCULAR LENS PLACEMENT (IOC);  Surgeon: Tonny Branch, MD;  Location: [facility];  Service: Ophthalmology;  Laterality: Right;  CDE 8.68  . COLONOSCOPY  09/14/2005   Dr. Leonard Schwartz colitis  . COLONOSCOPY WITH ESOPHAGOGASTRODUODENOSCOPY (EGD)  04/14/2012   Dr. Gala Romney- EGD= gastric erosions of doubtful clinical significance per bx- chronic inactive gastritis, benign small bowel mucosa. TCS=colonic diverticulosis, tubular adenoma  . CORONARY ARTERY BYPASS GRAFT     LIMA-LAD; SVG-OM; SVG-DX in 1995 [place]  . NECK SURGERY     Cervical laminectomy  . PARTIAL HYSTERECTOMY      Current Outpatient Medications  Medication Sig Dispense Refill  . alprazolam (XANAX) 2 MG tablet Take 2 mg by mouth at bedtime as needed. Anxiety.    Marland Kitchen amLODipine (NORVASC) 5 MG tablet Take 5 mg by mouth daily.    Marland Kitchen aspirin 81 MG tablet Take 81 mg by mouth daily.      . clopidogrel (PLAVIX) 75 MG tablet Take 75 mg by mouth daily.      . isosorbide mononitrate (IMDUR) 60 MG 24 hr tablet TAKE 1 TABLET BY MOUTH IN  THE MORNING AND 1/2 TABLET  BY MOUTH IN THE EVENING 135 tablet 1  . metFORMIN (GLUCOPHAGE) 500 MG tablet Take 1,000 mg by mouth 2 (two) times daily  with a meal.     . metoprolol (LOPRESSOR) 50 MG tablet Take 50 mg by mouth 2 (two) times daily.     . nitroGLYCERIN (NITROSTAT) 0.4 MG SL tablet Place 1 tablet (0.4 mg total) under the tongue every 5 (five) minutes as needed. 90 tablet 3  . oxyCODONE-acetaminophen (PERCOCET) 7.5-325 MG per tablet Take 1 tablet by mouth every 8 (eight) hours as needed. Pain.    . pravastatin (PRAVACHOL) 80 MG tablet Take 80 mg by mouth daily.      . sertraline (ZOLOFT) 50 MG tablet 25 mg daily for two weeks, then 50 mg daily 30 tablet 1  . torsemide (DEMADEX) 20 MG tablet Take 40 mg by mouth as needed.     No current facility-administered  medications for this visit.    Allergies:  Patient has no known allergies.   Social History: The patient  reports that she has been smoking cigarettes.  She started smoking about 58 years ago. She has a 25.00 pack-year smoking history. she has never used smokeless tobacco. She reports that she does not drink alcohol or use drugs.   ROS:  Please see the history of present illness. Otherwise, complete review of systems is positive for chronic fatigue.  All other systems are reviewed and negative.   Physical Exam: VS:  BP (!) 158/62   Pulse (!) 55   Ht 5\' 4"  (1.626 m)   Wt 205 lb 12.8 oz (93.4 kg)   SpO2 97%   BMI 35.33 kg/m , BMI Body mass index is 35.33 kg/m.  Wt Readings from Last 3 Encounters:  03/14/17 205 lb 12.8 oz (93.4 kg)  09/04/16 202 lb 6.4 oz (91.8 kg)  03/08/16 206 lb 12.8 oz (93.8 kg)    General: Chronically ill-appearing woman, no distress. HEENT: Conjunctiva and lids normal, oropharynx clear. Neck: Supple, no elevated JVP or carotid bruits, no thyromegaly. Lungs: Decreased breath sounds without wheezing, nonlabored breathing at rest. Cardiac: Regular rate and rhythm, no S3 or significant systolic murmur. Abdomen: Soft, nontender, bowel sounds present. Extremities: No pitting edema, distal pulses 2+. Skin: Warm and dry. Musculoskeletal: No kyphosis. Neuropsychiatric: Alert and oriented x3, affect grossly appropriate.  ECG: I personally reviewed the tracing from 09/16/2015 which showed sinus rhythm with nonspecific ST changes and anteroseptal Q waves.  Recent Labwork:  July 2016: Cholesterol 124, triglycerides 126, HDL 39, LDL 60, hemoglobin 13.1, platelets 196, TSH 4.4, BUN 11, creatinine 0.9, potassium 3.5, AST 17, ALT 11, hemoglobin A1c 6.1  Other Studies Reviewed Today:  Carotid Dopplers 07/20/2016: No significant LICA stenosis, 13-24% RICA stenosis.  Echocardiogram 11/22/2014 [facility]): Mild LVH with LVEF 40-10%, diastolic dysfunction (ungraded), mild left  atrial enlargement, sclerotic aortic valve without stenosis, mild mitral regurgitation, mitral annular calcification, RVSP 26 mmHg.  Assessment and Plan:  1.  Multivessel CAD status post CABG with graft disease.  We are continue with medical therapy in the absence of accelerating angina.  No changes were made today.  ECG reviewed and stable.  2.  Moderate bilateral carotid artery disease.  She continues on aspirin, Plavix, and statin.  3.  Essential hypertension, reports compliance with her medications.  Blood pressure is up today.  Keep follow-up with Dr. Pleas Koch.  4.  Hyperlipidemia, continues on Pravachol.  Current medicines were reviewed with the patient today.   Orders Placed This Encounter  Procedures  . EKG 12-Lead    Disposition: Follow-up in 6 months.  Signed, Satira Sark, MD, [facility] 03/14/2017 2:40 PM  [facility] [place], [place], [place] [address] Phone: [phone]; Fax: [phone]

## 2017-03-14 ENCOUNTER — Encounter: Payer: Self-pay | Admitting: Cardiology

## 2017-03-14 ENCOUNTER — Encounter: Payer: Self-pay | Admitting: Internal Medicine

## 2017-03-14 ENCOUNTER — Ambulatory Visit: Payer: Medicare Other | Admitting: Cardiology

## 2017-03-14 VITALS — BP 158/62 | HR 55 | Ht 64.0 in | Wt 205.8 lb

## 2017-03-14 DIAGNOSIS — I6523 Occlusion and stenosis of bilateral carotid arteries: Secondary | ICD-10-CM | POA: Diagnosis not present

## 2017-03-14 DIAGNOSIS — E782 Mixed hyperlipidemia: Secondary | ICD-10-CM | POA: Diagnosis not present

## 2017-03-14 DIAGNOSIS — I10 Essential (primary) hypertension: Secondary | ICD-10-CM

## 2017-03-14 DIAGNOSIS — I25119 Atherosclerotic heart disease of native coronary artery with unspecified angina pectoris: Secondary | ICD-10-CM | POA: Diagnosis not present

## 2017-03-14 NOTE — Patient Instructions (Signed)

## 2017-03-26 DIAGNOSIS — R3915 Urgency of urination: Secondary | ICD-10-CM | POA: Diagnosis not present

## 2017-03-26 DIAGNOSIS — R3912 Poor urinary stream: Secondary | ICD-10-CM | POA: Diagnosis not present

## 2017-03-29 DIAGNOSIS — G4733 Obstructive sleep apnea (adult) (pediatric): Secondary | ICD-10-CM | POA: Diagnosis not present

## 2017-04-01 DIAGNOSIS — D519 Vitamin B12 deficiency anemia, unspecified: Secondary | ICD-10-CM | POA: Diagnosis not present

## 2017-04-01 DIAGNOSIS — E1165 Type 2 diabetes mellitus with hyperglycemia: Secondary | ICD-10-CM | POA: Diagnosis not present

## 2017-04-01 DIAGNOSIS — I10 Essential (primary) hypertension: Secondary | ICD-10-CM | POA: Diagnosis not present

## 2017-04-01 DIAGNOSIS — E782 Mixed hyperlipidemia: Secondary | ICD-10-CM | POA: Diagnosis not present

## 2017-04-01 DIAGNOSIS — M545 Low back pain: Secondary | ICD-10-CM | POA: Diagnosis not present

## 2017-04-01 DIAGNOSIS — E876 Hypokalemia: Secondary | ICD-10-CM | POA: Diagnosis not present

## 2017-04-03 DIAGNOSIS — Z0001 Encounter for general adult medical examination with abnormal findings: Secondary | ICD-10-CM | POA: Diagnosis not present

## 2017-04-03 DIAGNOSIS — I10 Essential (primary) hypertension: Secondary | ICD-10-CM | POA: Diagnosis not present

## 2017-04-03 DIAGNOSIS — Z23 Encounter for immunization: Secondary | ICD-10-CM | POA: Diagnosis not present

## 2017-04-03 DIAGNOSIS — E1165 Type 2 diabetes mellitus with hyperglycemia: Secondary | ICD-10-CM | POA: Diagnosis not present

## 2017-04-03 DIAGNOSIS — D519 Vitamin B12 deficiency anemia, unspecified: Secondary | ICD-10-CM | POA: Diagnosis not present

## 2017-04-03 DIAGNOSIS — Z1389 Encounter for screening for other disorder: Secondary | ICD-10-CM | POA: Diagnosis not present

## 2017-04-03 DIAGNOSIS — E782 Mixed hyperlipidemia: Secondary | ICD-10-CM | POA: Diagnosis not present

## 2017-04-28 DIAGNOSIS — G4733 Obstructive sleep apnea (adult) (pediatric): Secondary | ICD-10-CM | POA: Diagnosis not present

## 2017-05-06 DIAGNOSIS — Z79899 Other long term (current) drug therapy: Secondary | ICD-10-CM | POA: Diagnosis not present

## 2017-05-21 ENCOUNTER — Other Ambulatory Visit: Payer: Self-pay | Admitting: Cardiology

## 2017-05-29 DIAGNOSIS — G4733 Obstructive sleep apnea (adult) (pediatric): Secondary | ICD-10-CM | POA: Diagnosis not present

## 2017-05-31 DIAGNOSIS — E782 Mixed hyperlipidemia: Secondary | ICD-10-CM | POA: Diagnosis not present

## 2017-05-31 DIAGNOSIS — D519 Vitamin B12 deficiency anemia, unspecified: Secondary | ICD-10-CM | POA: Diagnosis not present

## 2017-05-31 DIAGNOSIS — E876 Hypokalemia: Secondary | ICD-10-CM | POA: Diagnosis not present

## 2017-05-31 DIAGNOSIS — E870 Hyperosmolality and hypernatremia: Secondary | ICD-10-CM | POA: Diagnosis not present

## 2017-05-31 DIAGNOSIS — E1165 Type 2 diabetes mellitus with hyperglycemia: Secondary | ICD-10-CM | POA: Diagnosis not present

## 2017-05-31 DIAGNOSIS — I10 Essential (primary) hypertension: Secondary | ICD-10-CM | POA: Diagnosis not present

## 2017-05-31 DIAGNOSIS — E162 Hypoglycemia, unspecified: Secondary | ICD-10-CM | POA: Diagnosis not present

## 2017-06-04 DIAGNOSIS — N182 Chronic kidney disease, stage 2 (mild): Secondary | ICD-10-CM | POA: Diagnosis not present

## 2017-06-04 DIAGNOSIS — Z0001 Encounter for general adult medical examination with abnormal findings: Secondary | ICD-10-CM | POA: Diagnosis not present

## 2017-06-27 DIAGNOSIS — G4733 Obstructive sleep apnea (adult) (pediatric): Secondary | ICD-10-CM | POA: Diagnosis not present

## 2017-07-02 DIAGNOSIS — R3912 Poor urinary stream: Secondary | ICD-10-CM | POA: Diagnosis not present

## 2017-07-02 DIAGNOSIS — R3915 Urgency of urination: Secondary | ICD-10-CM | POA: Diagnosis not present

## 2017-07-25 ENCOUNTER — Ambulatory Visit (INDEPENDENT_AMBULATORY_CARE_PROVIDER_SITE_OTHER): Payer: Medicare Other

## 2017-07-25 DIAGNOSIS — R0989 Other specified symptoms and signs involving the circulatory and respiratory systems: Secondary | ICD-10-CM

## 2017-07-27 DIAGNOSIS — G4733 Obstructive sleep apnea (adult) (pediatric): Secondary | ICD-10-CM | POA: Diagnosis not present

## 2017-07-31 ENCOUNTER — Telehealth: Payer: Self-pay

## 2017-07-31 DIAGNOSIS — I2510 Atherosclerotic heart disease of native coronary artery without angina pectoris: Secondary | ICD-10-CM

## 2017-07-31 NOTE — Telephone Encounter (Signed)
Patient notified. Routed to PCP 

## 2017-07-31 NOTE — Telephone Encounter (Signed)
-----   Message from Merlene Laughter, LPN sent at 10/01/4032  7:47 AM EDT -----   ----- Message ----- From: Satira Sark, MD Sent: 07/26/2017  10:57 AM To: Merlene Laughter, LPN  Results reviewed.  40-59% RICA stenosis and 7-42% LICA stenosis.  Continue with medical therapy and follow-up carotid Dopplers in 1 year. A copy of this test should be forwarded to [place], Virgina Evener, MD.

## 2017-08-13 DIAGNOSIS — G459 Transient cerebral ischemic attack, unspecified: Secondary | ICD-10-CM | POA: Diagnosis not present

## 2017-08-13 DIAGNOSIS — Z7984 Long term (current) use of oral hypoglycemic drugs: Secondary | ICD-10-CM | POA: Diagnosis not present

## 2017-08-13 DIAGNOSIS — I110 Hypertensive heart disease with heart failure: Secondary | ICD-10-CM | POA: Diagnosis not present

## 2017-08-13 DIAGNOSIS — R404 Transient alteration of awareness: Secondary | ICD-10-CM | POA: Diagnosis not present

## 2017-08-13 DIAGNOSIS — R200 Anesthesia of skin: Secondary | ICD-10-CM | POA: Diagnosis not present

## 2017-08-13 DIAGNOSIS — Z79899 Other long term (current) drug therapy: Secondary | ICD-10-CM | POA: Diagnosis not present

## 2017-08-13 DIAGNOSIS — Z8249 Family history of ischemic heart disease and other diseases of the circulatory system: Secondary | ICD-10-CM | POA: Diagnosis not present

## 2017-08-13 DIAGNOSIS — E119 Type 2 diabetes mellitus without complications: Secondary | ICD-10-CM | POA: Diagnosis not present

## 2017-08-13 DIAGNOSIS — I509 Heart failure, unspecified: Secondary | ICD-10-CM | POA: Diagnosis not present

## 2017-08-13 DIAGNOSIS — R531 Weakness: Secondary | ICD-10-CM | POA: Diagnosis not present

## 2017-08-19 ENCOUNTER — Telehealth: Payer: Self-pay | Admitting: Neurology

## 2017-08-19 ENCOUNTER — Encounter: Payer: Self-pay | Admitting: Neurology

## 2017-08-19 ENCOUNTER — Ambulatory Visit: Payer: Medicare Other | Admitting: Neurology

## 2017-08-19 VITALS — BP 107/63 | HR 49 | Ht 64.0 in | Wt 202.0 lb

## 2017-08-19 DIAGNOSIS — G459 Transient cerebral ischemic attack, unspecified: Secondary | ICD-10-CM

## 2017-08-19 NOTE — Progress Notes (Signed)
[facility] [address] [place] [address] [phone]       OFFICE CONSULT NOTE  Ms. IVAL BASQUEZ Date of Birth:  1945/10/17 Medical Record Number:  [id number]   Referring MD:  Aggie Hacker, PA-c  Reason for Referral:  TIA  HPI: Mr Brigandi is a pleasant 72 year lady who is referred for evaluation for recurrent TIAs. History is obtained from the patient, granddaughter and review of referral notes from primary physician.the patient said that she's had about 4-5 episodes ofstereotypical recurrent TIAs since last 13 months. The episode begins without warning. She developed sudden onset of numbness and weakness on the left hemibody and is unable to grasp objects in her hands and his feels weak. She has to lie down and starts feeling better after 5-10 minutes. The last episode occurred on 08/13/17 when she went to upon for patient she felt tired, dizzy and nausea.. She can back and was able to make it back to her house and when she sat in a chair she noticed left side was numb and weak when face was drawn to the left. She denied any twitching or jerking movement impaired consciousness or loss of consciousness. She laid down and felt better when she got up. This episode occurred twice later on and she called EMS she was taken to the emergency room at [facility] where CT scan of the head which have personally reviewed the films was unremarkable except for mild age-related changes of chronic microvascular ischemia and atrophy. Carotid ultrasound was subsequently done a few days later in the cardiologist's office which did not show significant extracranial stenosis. The patient does have a history of remote coronary artery disease. She continues to smoke and smokes half pack per day. She states her blood pressure is under good control and today it is 107/60. She states her diabetes is also under reasonable control his fasting sugars ranging in the 120s mainly. She has  remained on antiplatelet therapy of aspirin and Plavix for several years. She denies any other episode of TIA in the form of loss of vision, slurred speech, diplopia, vertigo, gait or balance problems.  ROS:   14 system review of systems is positive for   fever, chills, fatigue, palpitations, leg swelling, hearing loss, spinning sensation, trouble swallowing, shortness of breath, cough, snoring, diarrhea, urination problems, easy bruising and bleeding, feeling hot and cold, flushing, joint pain, aching muscles, runny nose, memory loss, confusion, numbness, weakness, difficulty swallowing, dizziness, anxiety, depression, not enough sleep, decreased energy, change in appetite, disinterest in activities, insomnia, sleepiness, snoring and all other systems negative  PMH:  Past Medical History:  Diagnosis Date  . Acute ischemic colitis ([place]) 09/11/2005  . Anxiety   . Arthritis   . Atherosclerotic vascular disease    Calcified plaque at the origin of the celiac and SMA  . CHF (congestive heart failure) ([place])   . Coronary atherosclerosis of native coronary artery    Mulitvessel LVEF 50-50%, DES circ 1/11, occluded SVG to OM and SVG to diagonal , LVEF 60%  . Depression   . Diverticulosis of colon   . Essential hypertension, benign   . Hypothyroidism   . Mesenteric ischemia ([place])   . Mitral regurgitation    Mild to moderate  . Mixed hyperlipidemia   . Myocardial infarction ([place]) 1990  . Obesity   . Orthostatic hypotension   . PAD (peripheral artery disease) ([place])   . PVC's (premature ventricular contractions)   . Schizophrenia ([place])   .  Sleep apnea    CPAP  . Tubular adenoma   . Type 2 diabetes mellitus ([place])     Social History:  Social History   Socioeconomic History  . Marital status: Widowed    Spouse name: Not on file  . Number of children: 4  . Years of education: Not on file  . Highest education level: Not on file  Occupational History  . Occupation: DISABLED    Employer:  UNEMPLOYED  Social Needs  . Financial resource strain: Not on file  . Food insecurity:    Worry: Not on file    Inability: Not on file  . Transportation needs:    Medical: Not on file    Non-medical: Not on file  Tobacco Use  . Smoking status: Current Every Day Smoker    Packs/day: 0.50    Years: 50.00    Pack years: 25.00    Types: Cigarettes    Start date: 03/16/1959  . Smokeless tobacco: Never Used  Substance and Sexual Activity  . Alcohol use: No    Alcohol/week: 0.0 oz    Comment: Rare etoh 3-4 drinks per yr, hx beer heavily for 10-12 yrs, quit heavy etoh 1990  . Drug use: No  . Sexual activity: Not on file  Lifestyle  . Physical activity:    Days per week: Not on file    Minutes per session: Not on file  . Stress: Not on file  Relationships  . Social connections:    Talks on phone: Not on file    Gets together: Not on file    Attends religious service: Not on file    Active member of club or organization: Not on file    Attends meetings of clubs or organizations: Not on file    Relationship status: Not on file  . Intimate partner violence:    Fear of current or ex partner: Not on file    Emotionally abused: Not on file    Physically abused: Not on file    Forced sexual activity: Not on file  Other Topics Concern  . Not on file  Social History Narrative   LIves alone    Medications:   Current Outpatient Medications on File Prior to Visit  Medication Sig Dispense Refill  . amLODipine (NORVASC) 2.5 MG tablet Take by mouth.    Marland Kitchen aspirin 81 MG tablet Take 81 mg by mouth daily.      . busPIRone (BUSPAR) 15 MG tablet 15 mg 3 (three) times daily.     . clopidogrel (PLAVIX) 75 MG tablet Take 75 mg by mouth daily.      . isosorbide mononitrate (IMDUR) 60 MG 24 hr tablet TAKE 1 TABLET BY MOUTH IN  THE MORNING AND 1/2 TABLET  IN THE EVENING 135 tablet 3  . metFORMIN (GLUCOPHAGE) 500 MG tablet Take 1,000 mg by mouth 2 (two) times daily with a meal.     . metoprolol  (LOPRESSOR) 50 MG tablet Take 50 mg by mouth 2 (two) times daily.     . Multiple Vitamins-Minerals (MULTIVITAMIN ADULT PO) Take by mouth.    . nitroGLYCERIN (NITROSTAT) 0.4 MG SL tablet Place 1 tablet (0.4 mg total) under the tongue every 5 (five) minutes as needed. 90 tablet 3  . oxyCODONE-acetaminophen (PERCOCET) 7.5-325 MG per tablet Take 1 tablet by mouth every 8 (eight) hours as needed. Pain.    . potassium chloride (K-DUR) 10 MEQ tablet Take by mouth.    . pravastatin (PRAVACHOL) 80  MG tablet Take 80 mg by mouth daily.      . tamsulosin (FLOMAX) 0.4 MG CAPS capsule Take 0.4 mg by mouth.    . torsemide (DEMADEX) 20 MG tablet Take 40 mg by mouth as needed.     No current facility-administered medications on file prior to visit.     Allergies:  No Known Allergies  Physical Exam General: well developed, well nourished elderly Caucasian lady, seated, in no evident distress Head: head normocephalic and atraumatic.   Neck: supple with no carotid or supraclavicular bruits Cardiovascular: regular rate and rhythm, no murmurs Musculoskeletal: no deformity Skin:  no rash/petichiae Vascular:  Normal pulses all extremities  Neurologic Exam Mental Status: Awake and fully alert. Oriented to place and time. Recent and remote memory intact. Attention span, concentration and fund of knowledge appropriate. Mood and affect appropriate.  Cranial Nerves: Fundoscopic exam reveals sharp disc margins. Pupils equal, briskly reactive to light. Extraocular movements full without nystagmus. Visual fields full to confrontation. Hearing intact. Facial sensation intact. Face, tongue, palate moves normally and symmetrically.  Motor: Normal bulk and tone. Normal strength in all tested extremity muscles. Sensory.: intact to touch , pinprick , position and vibratory sensation.  Coordination: Rapid alternating movements normal in all extremities. Finger-to-nose and heel-to-shin performed accurately bilaterally. Gait  and Station: Arises from chair without difficulty. Stance is normal. Gait demonstrates normal stride length and balance . Able to heel, toe and tandem walk with  difficulty.  Reflexes: 1+ and symmetric. Toes downgoing.   NIHSS  0Modified Rankin  0   ASSESSMENT: 72 year old lady with recurrent episodes of left-sided numbness and weakness possibly right hemispheric TIAs with vascular risk factors of diabetes, hypertension and hyperlipidemia    PLAN: I had a long d/w patient and his grand daughter about her recurrent TIA, risk for recurrent stroke/TIAs, personally independently reviewed imaging studies and stroke evaluation results and answered questions.Continue aspirin 81 mg daily and clopidogrel 75 mg daily  for secondary stroke prevention and maintain strict control of hypertension with blood pressure goal below 130/90, diabetes with hemoglobin A1c goal below 6.5% and lipids with LDL cholesterol goal below 70 mg/dL. I also advised the patient to eat a healthy diet with plenty of whole grains, cereals, fruits and vegetables, exercise regularly and maintain ideal body weight . MRI scan of the brain with and without contrast, MRA of the brain and neck and lipid profile and hemoglobin A1c. I counseled the patient to quit smoking completely.greater than 50% time during this 45 minute consultation visit was spent on counseling and coordination of care about TIAs and discussion about prevention and answering questions.Followup in the future with me in 2 months or call earlier if necessary Antony Contras, MD  [facility] [address] [place], [place] [address]  Phone [phone] Fax [phone] Note: This document was prepared with digital dictation and possible smart phrase technology. Any transcriptional errors that result from this process are unintentional.

## 2017-08-19 NOTE — Patient Instructions (Signed)
I had a long d/w patient and his grand daughter about her recurrent TIA, risk for recurrent stroke/TIAs, personally independently reviewed imaging studies and stroke evaluation results and answered questions.Continue aspirin 81 mg daily and clopidogrel 75 mg daily  for secondary stroke prevention and maintain strict control of hypertension with blood pressure goal below 130/90, diabetes with hemoglobin A1c goal below 6.5% and lipids with LDL cholesterol goal below 70 mg/dL. I also advised the patient to eat a healthy diet with plenty of whole grains, cereals, fruits and vegetables, exercise regularly and maintain ideal body weight Provider, please review the patient's drug allergy history. Some issues need clarification. MRI scan of the brain with and without contrast, MRA of the brain and neck and lipid profile and hemoglobin A1c. I counseled the patient to quit smoking completely.Followup in the future with me in 2 months or call earlier if necessary   Stroke Prevention Some medical conditions and behaviors are associated with a higher chance of having a stroke. You can help prevent a stroke by making nutrition, lifestyle, and other changes, including managing any medical conditions you may have. What nutrition changes can be made?  Eat healthy foods. You can do this by: ? Choosing foods high in fiber, such as fresh fruits and vegetables and whole grains. ? Eating at least 5 or more servings of fruits and vegetables a day. Try to fill half of your plate at each meal with fruits and vegetables. ? Choosing lean protein foods, such as lean cuts of meat, poultry without skin, fish, tofu, beans, and nuts. ? Eating low-fat dairy products. ? Avoiding foods that are high in salt (sodium). This can help lower blood pressure. ? Avoiding foods that have saturated fat, trans fat, and cholesterol. This can help prevent high cholesterol. ? Avoiding processed and premade foods.  Follow your health care provider's  specific guidelines for losing weight, controlling high blood pressure (hypertension), lowering high cholesterol, and managing diabetes. These may include: ? Reducing your daily calorie intake. ? Limiting your daily sodium intake to 1,500 milligrams (mg). ? Using only healthy fats for cooking, such as olive oil, canola oil, or sunflower oil. ? Counting your daily carbohydrate intake. What lifestyle changes can be made?  Maintain a healthy weight. Talk to your health care provider about your ideal weight.  Get at least 30 minutes of moderate physical activity at least 5 days a week. Moderate activity includes brisk walking, biking, and swimming.  Do not use any products that contain nicotine or tobacco, such as cigarettes and e-cigarettes. If you need help quitting, ask your health care provider. It may also be helpful to avoid exposure to secondhand smoke.  Limit alcohol intake to no more than 1 drink a day for nonpregnant women and 2 drinks a day for men. One drink equals 12 oz of beer, 5 oz of wine, or 1 oz of hard liquor.  Stop any illegal drug use.  Avoid taking birth control pills. Talk to your health care provider about the risks of taking birth control pills if: ? You are over 49 years old. ? You smoke. ? You get migraines. ? You have ever had a blood clot. What other changes can be made?  Manage your cholesterol levels. ? Eating a healthy diet is important for preventing high cholesterol. If cholesterol cannot be managed through diet alone, you may also need to take medicines. ? Take any prescribed medicines to control your cholesterol as told by your health care provider.  Manage your diabetes. ? Eating a healthy diet and exercising regularly are important parts of managing your blood sugar. If your blood sugar cannot be managed through diet and exercise, you may need to take medicines. ? Take any prescribed medicines to control your diabetes as told by your health care  provider.  Control your hypertension. ? To reduce your risk of stroke, try to keep your blood pressure below 130/80. ? Eating a healthy diet and exercising regularly are an important part of controlling your blood pressure. If your blood pressure cannot be managed through diet and exercise, you may need to take medicines. ? Take any prescribed medicines to control hypertension as told by your health care provider. ? Ask your health care provider if you should monitor your blood pressure at home. ? Have your blood pressure checked every year, even if your blood pressure is normal. Blood pressure increases with age and some medical conditions.  Get evaluated for sleep disorders (sleep apnea). Talk to your health care provider about getting a sleep evaluation if you snore a lot or have excessive sleepiness.  Take over-the-counter and prescription medicines only as told by your health care provider. Aspirin or blood thinners (antiplatelets or anticoagulants) may be recommended to reduce your risk of forming blood clots that can lead to stroke.  Make sure that any other medical conditions you have, such as atrial fibrillation or atherosclerosis, are managed. What are the warning signs of a stroke? The warning signs of a stroke can be easily remembered as BEFAST.  B is for balance. Signs include: ? Dizziness. ? Loss of balance or coordination. ? Sudden trouble walking.  E is for eyes. Signs include: ? A sudden change in vision. ? Trouble seeing.  F is for face. Signs include: ? Sudden weakness or numbness of the face. ? The face or eyelid drooping to one side.  A is for arms. Signs include: ? Sudden weakness or numbness of the arm, usually on one side of the body.  S is for speech. Signs include: ? Trouble speaking (aphasia). ? Trouble understanding.  T is for time. ? These symptoms may represent a serious problem that is an emergency. Do not wait to see if the symptoms will go away.  Get medical help right away. Call your local emergency services (911 in the [place] Do not drive yourself to the hospital.  Other signs of stroke may include: ? A sudden, severe headache with no known cause. ? Nausea or vomiting. ? Seizure.  Where to find more information: For more information, visit:  American Stroke Association: www.strokeassociation.org  National Stroke Association: www.stroke.org  Summary  You can prevent a stroke by eating healthy, exercising, not smoking, limiting alcohol intake, and managing any medical conditions you may have.  Do not use any products that contain nicotine or tobacco, such as cigarettes and e-cigarettes. If you need help quitting, ask your health care provider. It may also be helpful to avoid exposure to secondhand smoke.  Remember BEFAST for warning signs of stroke. Get help right away if you or a loved one has any of these signs. This information is not intended to replace advice given to you by your health care provider. Make sure you discuss any questions you have with your health care provider. Document Released: 05/24/2004 Document Revised: 05/22/2016 Document Reviewed: 05/22/2016 Elsevier Interactive Patient Education  [employer].

## 2017-08-19 NOTE — Telephone Encounter (Signed)
[employer] Medicare order sent to [facility]. No auth they will reach out to the pt to schedule.  °

## 2017-08-20 LAB — LIPID PANEL
CHOL/HDL RATIO: 3.2 ratio (ref 0.0–4.4)
Cholesterol, Total: 142 mg/dL (ref 100–199)
HDL: 45 mg/dL (ref >39)
LDL Calculated: 66 mg/dL (ref 0–99)
Triglycerides: 154 mg/dL — ABNORMAL HIGH (ref 0–149)
VLDL Cholesterol Cal: 31 mg/dL (ref 5–40)

## 2017-08-20 LAB — HEMOGLOBIN A1C
Est. average glucose Bld gHb Est-mCnc: 120 mg/dL
HEMOGLOBIN A1C: 5.8 % — AB (ref 4.8–5.6)

## 2017-08-22 ENCOUNTER — Telehealth: Payer: Self-pay

## 2017-08-22 NOTE — Telephone Encounter (Signed)
-----   Message from Garvin Fila, MD sent at 08/22/2017  8:10 AM EDT ----- Mitchell Heir inform the patient that her cholesterol profile and screening test for diabetes were both satisfactory

## 2017-08-22 NOTE — Telephone Encounter (Signed)
Rn was calling patient about lab work. The granddaughter on dpr was given the results. RN stated the cholesterol panel and diabetes were satisfactory. The granddaughter verbalized understanding. ------

## 2017-08-25 DIAGNOSIS — I252 Old myocardial infarction: Secondary | ICD-10-CM | POA: Diagnosis not present

## 2017-08-25 DIAGNOSIS — I110 Hypertensive heart disease with heart failure: Secondary | ICD-10-CM | POA: Diagnosis not present

## 2017-08-25 DIAGNOSIS — J209 Acute bronchitis, unspecified: Secondary | ICD-10-CM | POA: Diagnosis not present

## 2017-08-25 DIAGNOSIS — Z7982 Long term (current) use of aspirin: Secondary | ICD-10-CM | POA: Diagnosis not present

## 2017-08-25 DIAGNOSIS — J449 Chronic obstructive pulmonary disease, unspecified: Secondary | ICD-10-CM | POA: Diagnosis not present

## 2017-08-25 DIAGNOSIS — G4730 Sleep apnea, unspecified: Secondary | ICD-10-CM | POA: Diagnosis not present

## 2017-08-25 DIAGNOSIS — E119 Type 2 diabetes mellitus without complications: Secondary | ICD-10-CM | POA: Diagnosis not present

## 2017-08-25 DIAGNOSIS — J440 Chronic obstructive pulmonary disease with acute lower respiratory infection: Secondary | ICD-10-CM | POA: Diagnosis not present

## 2017-08-25 DIAGNOSIS — Z951 Presence of aortocoronary bypass graft: Secondary | ICD-10-CM | POA: Diagnosis not present

## 2017-08-25 DIAGNOSIS — Z720 Tobacco use: Secondary | ICD-10-CM | POA: Diagnosis not present

## 2017-08-25 DIAGNOSIS — Z7902 Long term (current) use of antithrombotics/antiplatelets: Secondary | ICD-10-CM | POA: Diagnosis not present

## 2017-08-25 DIAGNOSIS — I509 Heart failure, unspecified: Secondary | ICD-10-CM | POA: Diagnosis not present

## 2017-08-25 DIAGNOSIS — R079 Chest pain, unspecified: Secondary | ICD-10-CM | POA: Diagnosis not present

## 2017-08-25 DIAGNOSIS — Z79899 Other long term (current) drug therapy: Secondary | ICD-10-CM | POA: Diagnosis not present

## 2017-08-25 DIAGNOSIS — R0602 Shortness of breath: Secondary | ICD-10-CM | POA: Diagnosis not present

## 2017-08-25 DIAGNOSIS — Z955 Presence of coronary angioplasty implant and graft: Secondary | ICD-10-CM | POA: Diagnosis not present

## 2017-08-25 DIAGNOSIS — Z7984 Long term (current) use of oral hypoglycemic drugs: Secondary | ICD-10-CM | POA: Diagnosis not present

## 2017-08-27 DIAGNOSIS — E782 Mixed hyperlipidemia: Secondary | ICD-10-CM | POA: Diagnosis not present

## 2017-08-27 DIAGNOSIS — I10 Essential (primary) hypertension: Secondary | ICD-10-CM | POA: Diagnosis not present

## 2017-08-27 DIAGNOSIS — R0902 Hypoxemia: Secondary | ICD-10-CM | POA: Diagnosis not present

## 2017-08-27 DIAGNOSIS — I2510 Atherosclerotic heart disease of native coronary artery without angina pectoris: Secondary | ICD-10-CM | POA: Diagnosis not present

## 2017-08-27 DIAGNOSIS — G4733 Obstructive sleep apnea (adult) (pediatric): Secondary | ICD-10-CM | POA: Diagnosis not present

## 2017-09-02 DIAGNOSIS — D519 Vitamin B12 deficiency anemia, unspecified: Secondary | ICD-10-CM | POA: Diagnosis not present

## 2017-09-02 DIAGNOSIS — E870 Hyperosmolality and hypernatremia: Secondary | ICD-10-CM | POA: Diagnosis not present

## 2017-09-02 DIAGNOSIS — E162 Hypoglycemia, unspecified: Secondary | ICD-10-CM | POA: Diagnosis not present

## 2017-09-02 DIAGNOSIS — E876 Hypokalemia: Secondary | ICD-10-CM | POA: Diagnosis not present

## 2017-09-02 DIAGNOSIS — E1165 Type 2 diabetes mellitus with hyperglycemia: Secondary | ICD-10-CM | POA: Diagnosis not present

## 2017-09-05 DIAGNOSIS — N182 Chronic kidney disease, stage 2 (mild): Secondary | ICD-10-CM | POA: Diagnosis not present

## 2017-09-05 DIAGNOSIS — R5382 Chronic fatigue, unspecified: Secondary | ICD-10-CM | POA: Diagnosis not present

## 2017-09-05 DIAGNOSIS — I10 Essential (primary) hypertension: Secondary | ICD-10-CM | POA: Diagnosis not present

## 2017-09-05 DIAGNOSIS — J441 Chronic obstructive pulmonary disease with (acute) exacerbation: Secondary | ICD-10-CM | POA: Diagnosis not present

## 2017-09-05 DIAGNOSIS — I2510 Atherosclerotic heart disease of native coronary artery without angina pectoris: Secondary | ICD-10-CM | POA: Diagnosis not present

## 2017-09-12 ENCOUNTER — Ambulatory Visit
Admission: RE | Admit: 2017-09-12 | Discharge: 2017-09-12 | Disposition: A | Discharge status: Home / Self Care | Payer: Medicare Other | Source: Ambulatory Visit | Attending: Neurology | Admitting: Neurology

## 2017-09-12 DIAGNOSIS — G459 Transient cerebral ischemic attack, unspecified: Secondary | ICD-10-CM | POA: Diagnosis not present

## 2017-09-12 MED ORDER — GADOBENATE DIMEGLUMINE 529 MG/ML IV SOLN
19.0000 mL | Freq: Once | INTRAVENOUS | Status: AC | PRN
  Administered 2017-09-12: 19 mL via INTRAVENOUS

## 2017-09-16 NOTE — Progress Notes (Signed)
Cardiology Office Note  Date: 09/17/2017   ID: [name], DOB 1945/11/04, MRN [id number]  PCP: Misty Hilding, MD  Primary Cardiologist: Misty Lesches, MD   Chief Complaint  Patient presents with  . Coronary Artery Disease    History of Present Illness: [name] is a 72 y.o. female last seen in November 2018.  She is here today with a family member for follow-up.  Reports no definitive angina symptoms or increasing shortness of breath.  Complains of chronic fatigue, also having some recent upper back discomfort that is worse when she moves her arms and in certain positions.  She was seen by Dr. Leonie [name] recently in consultation regarding possible TIA, neuroimaging studies are outlined below.  Patient states that she has been having episodes of dizziness and tingling in her face, but indicates that these are better since she has stopped using oxygen at nighttime (?).  I personally reviewed her ECG today which shows a sinus bradycardia with decreased R wave progression.  Current cardiac regimen includes aspirin, Norvasc, Imdur, Lopressor, Pravachol, Demadex with potassium supplements, and as needed nitroglycerin.  Past Medical History:  Diagnosis Date  . Acute ischemic colitis ([place]) 09/11/2005  . Anxiety   . Arthritis   . Atherosclerotic vascular disease    Calcified plaque at the origin of the celiac and SMA  . CHF (congestive heart failure) ([place])   . Coronary atherosclerosis of native coronary artery    Mulitvessel LVEF 50-50%, DES circ 1/11, occluded SVG to OM and SVG to diagonal , LVEF 60%  . Depression   . Diverticulosis of colon   . Essential hypertension, benign   . Hypothyroidism   . Mesenteric ischemia ([name])   . Mitral regurgitation    Mild to moderate  . Mixed hyperlipidemia   . Myocardial infarction ([name]) 1990  . Obesity   . Orthostatic hypotension   . PAD (peripheral artery disease) ([name])   . PVC's (premature ventricular contractions)   .  Schizophrenia ([name])   . Sleep apnea    CPAP  . Tubular adenoma   . Type 2 diabetes mellitus ([name])     Past Surgical History:  Procedure Laterality Date  . BACK SURGERY     Lumbar spine surgery  . Carotid endarectomy Bilateral   . CARPAL TUNNEL RELEASE     Bilateral  . CATARACT EXTRACTION W/PHACO Right 08/24/2013   Procedure: CATARACT EXTRACTION PHACO AND INTRAOCULAR LENS PLACEMENT (IOC);  Surgeon: Misty Branch, MD;  Location: [facility];  Service: Ophthalmology;  Laterality: Right;  CDE 8.68  . COLONOSCOPY  09/14/2005   Dr. Leonard [name] colitis  . COLONOSCOPY WITH ESOPHAGOGASTRODUODENOSCOPY (EGD)  04/14/2012   Dr. Gala [name]- EGD= gastric erosions of doubtful clinical significance per bx- chronic inactive gastritis, benign small bowel mucosa. TCS=colonic diverticulosis, tubular adenoma  . CORONARY ARTERY BYPASS GRAFT     LIMA-LAD; SVG-OM; SVG-DX in 1995 [place]  . NECK SURGERY     Cervical laminectomy  . PARTIAL HYSTERECTOMY    . stents x4      Current Outpatient Medications  Medication Sig Dispense Refill  . amLODipine (NORVASC) 2.5 MG tablet Take by mouth.    Marland Kitchen aspirin 81 MG tablet Take 81 mg by mouth daily.      . busPIRone (BUSPAR) 15 MG tablet 15 mg 3 (three) times daily.     . clopidogrel (PLAVIX) 75 MG tablet Take 75 mg by mouth daily.      . isosorbide mononitrate (IMDUR) 60 MG 24  hr tablet TAKE 1 TABLET BY MOUTH IN  THE MORNING AND 1/2 TABLET  IN THE EVENING 135 tablet 3  . metFORMIN (GLUCOPHAGE) 500 MG tablet Take 1,000 mg by mouth 2 (two) times daily with a meal.     . metoprolol (LOPRESSOR) 50 MG tablet Take 50 mg by mouth 2 (two) times daily.     . Multiple Vitamins-Minerals (MULTIVITAMIN ADULT PO) Take by mouth.    . nitroGLYCERIN (NITROSTAT) 0.4 MG SL tablet Place 1 tablet (0.4 mg total) under the tongue every 5 (five) minutes as needed. 90 tablet 3  . oxyCODONE-acetaminophen (PERCOCET) 7.5-325 MG per tablet Take 1 tablet by mouth every 8 (eight) hours as needed. Pain.    .  potassium chloride (K-DUR) 10 MEQ tablet Take by mouth.    . pravastatin (PRAVACHOL) 80 MG tablet Take 80 mg by mouth daily.      . tamsulosin (FLOMAX) 0.4 MG CAPS capsule Take 0.4 mg by mouth.    . torsemide (DEMADEX) 20 MG tablet Take 40 mg by mouth as needed.     No current facility-administered medications for this visit.    Allergies:  Patient has no known allergies.   Social History: The patient  reports that she has been smoking cigarettes.  She started smoking about 58 years ago. She has a 25.00 pack-year smoking history. She has never used smokeless tobacco. She reports that she does not drink alcohol or use drugs.   ROS:  Please see the history of present illness. Otherwise, complete review of systems is positive for weakness and fatigue.  All other systems are reviewed and negative.   Physical Exam: VS:  BP 130/64   Pulse (!) 56   Ht 5\' 4"  (1.626 m)   Wt 201 lb (91.2 kg)   SpO2 98%   BMI 34.50 kg/m , BMI Body mass index is 34.5 kg/m.  Wt Readings from Last 3 Encounters:  09/17/17 201 lb (91.2 kg)  08/19/17 202 lb (91.6 kg)  03/14/17 205 lb 12.8 oz (93.4 kg)    General: No distress. HEENT: Conjunctiva and lids normal, oropharynx clear. Neck: Supple, no elevated JVP or carotid bruits, no thyromegaly. Lungs: No wheezing, nonlabored breathing at rest. Cardiac: Regular rate and rhythm, no S3 or significant systolic murmur, no pericardial rub. Abdomen: Soft, nontender, bowel sounds present. Extremities: Trace ankle edema, distal pulses 2+. Skin: Warm and dry. Musculoskeletal: No kyphosis. Neuropsychiatric: Alert and oriented x3, affect grossly appropriate.  ECG: I personally reviewed the tracing from 03/14/2017 which showed sinus bradycardia with poor anteroseptal R wave progression, rule out old anteroseptal infarct pattern.  Recent Labwork:    Component Value Date/Time   CHOL 142 08/19/2017 1505   TRIG 154 (H) 08/19/2017 1505   HDL 45 08/19/2017 1505   CHOLHDL  3.2 08/19/2017 1505   LDLCALC 66 08/19/2017 1505  April 2019: Hemoglobin A1c 5.8  Other Studies Reviewed Today:  Carotid Dopplers 07/25/2017: 40 to 59% RICA stenosis and 1 to 16% LICA stenosis.  Echocardiogram 11/22/2014 [facility] Mild LVH with LVEF 10-96%, diastolic dysfunction (ungraded), mild left atrial enlargement, sclerotic aortic valve without stenosis, mild mitral regurgitation, mitral annular calcification, RVSP 29mmHg.  Brain and neck MRA/MRI 09/12/2017: IMPRESSION: This MR angiogram of the neck arteries shows the following: 1.   There is a 49% stenosis of the proximal right internal carotid artery and greater than 80% stenosis near the origin of the right external carotid artery. 2.   There is 30% stenosis of the proximal left  internal carotid artery and 40% stenosis near the origin of the left external carotid artery. 3.   The right vertebral artery is diminutive.  There is possible reduced flow in the preforaminal segment though this could also represent artifact.  IMPRESSION:  This MR angiogram of the intracranial arteries shows no definite intracranial stenosis.   There appeared to be narrowing of the cavernous segment of the left internal carotid artery on the reconstructed images though this was not verified on the source images and likely represents artifact.  IMPRESSION: This MRI of the brain with and without contrast shows the following: 1.    Mild generalized age-appropriate cortical atrophy and mild age-related chronic microvascular ischemic change. 2.    There are no acute findings and there is a normal enhancement pattern.  Assessment and Plan:  1.  Multivessel CAD status post CABG with documented graft disease.  She reports no progressive angina on medical therapy, ECG reviewed and overall stable.  We will continue with observation.  2.  Carotid artery disease, mild on the left and moderate on the right.  She is on antiplatelet therapy and statin.  3.   Essential hypertension, blood pressure control is adequate today.  Keep follow-up with Dr. Quintin Alto.  4.  Mixed hyperlipidemia, on Pravachol.  Recent LDL 66.  Current medicines were reviewed with the patient today.   Orders Placed This Encounter  Procedures  . EKG 12-Lead    Disposition: Follow-up in 6 months.  Signed, Satira Sark, MD, [facility] 09/17/2017 12:19 PM    [facility] at [address], [place], [place] [address] Phone: [phone]; Fax: [phone]

## 2017-09-17 ENCOUNTER — Encounter: Payer: Self-pay | Admitting: Cardiology

## 2017-09-17 ENCOUNTER — Ambulatory Visit: Payer: Medicare Other | Admitting: Cardiology

## 2017-09-17 ENCOUNTER — Telehealth: Payer: Self-pay | Admitting: Neurology

## 2017-09-17 ENCOUNTER — Encounter: Payer: Self-pay | Admitting: *Deleted

## 2017-09-17 VITALS — BP 130/64 | HR 56 | Ht 64.0 in | Wt 201.0 lb

## 2017-09-17 DIAGNOSIS — I6523 Occlusion and stenosis of bilateral carotid arteries: Secondary | ICD-10-CM | POA: Diagnosis not present

## 2017-09-17 DIAGNOSIS — E782 Mixed hyperlipidemia: Secondary | ICD-10-CM

## 2017-09-17 DIAGNOSIS — I25119 Atherosclerotic heart disease of native coronary artery with unspecified angina pectoris: Secondary | ICD-10-CM | POA: Diagnosis not present

## 2017-09-17 DIAGNOSIS — I10 Essential (primary) hypertension: Secondary | ICD-10-CM | POA: Diagnosis not present

## 2017-09-17 NOTE — Telephone Encounter (Signed)
Could you review and result note MRI, MRA's (3 tests) on the pt of Dr. Clydene Fake.

## 2017-09-17 NOTE — Telephone Encounter (Signed)
Pt granddaughter(on DPR) has called asking for results of the MRI because Heart Doctor is wanting to know how to proceed as a result of pt worsening according to granddaughter.

## 2017-09-17 NOTE — Patient Instructions (Signed)

## 2017-09-24 ENCOUNTER — Ambulatory Visit: Payer: Medicare Other | Admitting: Emergency Medicine

## 2017-09-24 ENCOUNTER — Encounter: Payer: Self-pay | Admitting: Emergency Medicine

## 2017-09-24 ENCOUNTER — Ambulatory Visit (INDEPENDENT_AMBULATORY_CARE_PROVIDER_SITE_OTHER)
Admission: RE | Admit: 2017-09-24 | Discharge: 2017-09-24 | Disposition: A | Discharge status: Home / Self Care | Payer: Medicare Other | Source: Ambulatory Visit | Attending: Emergency Medicine | Admitting: Emergency Medicine

## 2017-09-24 VITALS — BP 128/76 | HR 66 | Ht 64.0 in | Wt 196.0 lb

## 2017-09-24 DIAGNOSIS — R0600 Dyspnea, unspecified: Secondary | ICD-10-CM

## 2017-09-24 DIAGNOSIS — R0609 Other forms of dyspnea: Secondary | ICD-10-CM | POA: Diagnosis not present

## 2017-09-24 DIAGNOSIS — G4733 Obstructive sleep apnea (adult) (pediatric): Secondary | ICD-10-CM | POA: Diagnosis not present

## 2017-09-24 DIAGNOSIS — F17200 Nicotine dependence, unspecified, uncomplicated: Secondary | ICD-10-CM | POA: Diagnosis not present

## 2017-09-24 DIAGNOSIS — R0602 Shortness of breath: Secondary | ICD-10-CM | POA: Diagnosis not present

## 2017-09-24 NOTE — Assessment & Plan Note (Signed)
Potentially multifactorial but I do suspect that she has some COPD based on her tobacco history and the symptoms that she describes.  Degrees unclear.  She needs pulmonary function testing.  We will also perform walking and nocturnal oximetry, chest x-ray.  Follow-up once her testing is completed to determine oxygen dosing, possible bronchodilator therapy.

## 2017-09-24 NOTE — Progress Notes (Signed)
Subjective:    Patient ID: [name], female    DOB: [birth year], 72 y.o.   MRN: [id number]  HPI 72 year old active smoker (78 + pack years), with a history of coronary artery disease/CABG, carotid artery disease,, mesenteric arterial disease, hypertension with diastolic CHF, diabetes, obstructive sleep apnea improved on most recent PSG 05/2016 after 90 lbs wt loss. She was told that she still needed nocturnal O2, has not been wearing it.  She has been having more trouble this Winter and into the Spring. Was experiencing dizziness, lost appetite, poor sleep, minimal breathing changes. She then had to go to the [facility] ED in April for chest tightness and dyspnea. She had CXR and CT chest but doesn't know results. She suspected her heart disease as the cause. She received steroids and BD in the ED., ? How much they helped. She is referred to evaluate for underlying obstructive lung disease.     Review of Systems  Constitutional: Negative for fever and unexpected weight change.  HENT: Negative for congestion, dental problem, ear pain, nosebleeds, postnasal drip, rhinorrhea, sinus pressure, sneezing, sore throat and trouble swallowing.   Eyes: Negative for redness and itching.  Respiratory: Positive for cough and shortness of breath. Negative for chest tightness and wheezing.   Cardiovascular: Positive for chest pain and palpitations. Negative for leg swelling.  Gastrointestinal: Negative for nausea and vomiting.  Genitourinary: Negative for dysuria.  Musculoskeletal: Negative for joint swelling.  Skin: Negative for rash.  Neurological: Positive for headaches.  Hematological: Does not bruise/bleed easily.  Psychiatric/Behavioral: Positive for dysphoric mood. The patient is nervous/anxious.     Past Medical History:  Diagnosis Date  . Acute ischemic colitis ([place]) 09/11/2005  . Anxiety   . Arthritis   . Atherosclerotic vascular disease    Calcified plaque at the origin of the  celiac and SMA  . CHF (congestive heart failure) ([place])   . Coronary atherosclerosis of native coronary artery    Mulitvessel LVEF 50-50%, DES circ 1/11, occluded SVG to OM and SVG to diagonal , LVEF 60%  . Depression   . Diverticulosis of colon   . Essential hypertension, benign   . Hypothyroidism   . Mesenteric ischemia ([place])   . Mitral regurgitation    Mild to moderate  . Mixed hyperlipidemia   . Myocardial infarction ([place]) 1990  . Obesity   . Orthostatic hypotension   . PAD (peripheral artery disease) ([place])   . PVC's (premature ventricular contractions)   . Schizophrenia ([place])   . Sleep apnea    CPAP  . Tubular adenoma   . Type 2 diabetes mellitus ([facility])      Family History  Problem Relation Age of Onset  . Alcohol abuse Mother   . Alcohol abuse Father   . Coronary artery disease Unknown   . Hypertension Unknown   . Crohn's disease Sister 95  . Stroke Daughter      Social History   Socioeconomic History  . Marital status: Widowed    Spouse name: Not on file  . Number of children: 4  . Years of education: Not on file  . Highest education level: Not on file  Occupational History  . Occupation: DISABLED    Employer: UNEMPLOYED  Social Needs  . Financial resource strain: Not on file  . Food insecurity:    Worry: Not on file    Inability: Not on file  . Transportation needs:    Medical: Not on file    Non-medical: Not  on file  Tobacco Use  . Smoking status: Current Every Day Smoker    Packs/day: 0.50    Years: 50.00    Pack years: 25.00    Types: Cigarettes    Start date: 03/16/1959  . Smokeless tobacco: Never Used  Substance and Sexual Activity  . Alcohol use: No    Alcohol/week: 0.0 oz    Comment: Rare etoh 3-4 drinks per yr, hx beer heavily for 10-12 yrs, quit heavy etoh 1990  . Drug use: No  . Sexual activity: Not on file  Lifestyle  . Physical activity:    Days per week: Not on file    Minutes per session: Not on file  . Stress: Not on file    Relationships  . Social connections:    Talks on phone: Not on file    Gets together: Not on file    Attends religious service: Not on file    Active member of club or organization: Not on file    Attends meetings of clubs or organizations: Not on file    Relationship status: Not on file  . Intimate partner violence:    Fear of current or ex partner: Not on file    Emotionally abused: Not on file    Physically abused: Not on file    Forced sexual activity: Not on file  Other Topics Concern  . Not on file  Social History Narrative   LIves alone     No Known Allergies   Outpatient Medications Prior to Visit  Medication Sig Dispense Refill  . amLODipine (NORVASC) 5 MG tablet Take 5 mg by mouth daily.    . busPIRone (BUSPAR) 15 MG tablet 15 mg 3 (three) times daily.     . clopidogrel (PLAVIX) 75 MG tablet Take 75 mg by mouth daily.      . hydrOXYzine (ATARAX/VISTARIL) 25 MG tablet Take 25 mg by mouth daily as needed.    . metFORMIN (GLUCOPHAGE) 500 MG tablet Take 500 mg by mouth 2 (two) times daily with a meal.    . metoprolol (LOPRESSOR) 50 MG tablet Take 50 mg by mouth 2 (two) times daily.     . Multiple Vitamins-Minerals (MULTIVITAMIN ADULT PO) Take by mouth.    . nitroGLYCERIN (NITROSTAT) 0.4 MG SL tablet Place 1 tablet (0.4 mg total) under the tongue every 5 (five) minutes as needed. 90 tablet 3  . oxyCODONE-acetaminophen (PERCOCET) 7.5-325 MG per tablet Take 1 tablet by mouth every 8 (eight) hours as needed. Pain.    . pravastatin (PRAVACHOL) 80 MG tablet Take 80 mg by mouth daily.      . ramelteon (ROZEREM) 8 MG tablet Take 8 mg by mouth at bedtime.    . torsemide (DEMADEX) 20 MG tablet Take 40 mg by mouth as needed.     No facility-administered medications prior to visit.         Objective:   Physical Exam Vitals:   09/24/17 1110  BP: 128/76  Pulse: 66  SpO2: 95%  Weight: 196 lb (88.9 kg)  Height: 5\' 4"  (1.626 m)   Gen: Pleasant, overweight woman, in no  distress,  normal affect  ENT: No lesions,  mouth clear,  oropharynx clear, no postnasal drip  Neck: No JVD, no stridor  Lungs: No use of accessory muscles, clear bilaterally, no wheezing, no crackles  Cardiovascular: Regular, borderline bradycardic, no significant edema  Musculoskeletal: No deformities, no cyanosis or clubbing  Neuro: alert, non focal  Skin: Warm, no lesions  or rash      Assessment & Plan:  TOBACCO USER Needs to address smoking cessation. We will discuss further next visit  Dyspnea on exertion Potentially multifactorial but I do suspect that she has some COPD based on her tobacco history and the symptoms that she describes.  Degrees unclear.  She needs pulmonary function testing.  We will also perform walking and nocturnal oximetry, chest x-ray.  Follow-up once her testing is completed to determine oxygen dosing, possible bronchodilator therapy.  OSA (obstructive sleep apnea) History of sleep apnea that have been previously treated with CPAP.  She had a repeat polysomnogram in 05/2016 after 90 pounds weight loss and was told that she no longer needed CPAP but that she had nocturnal hypoxemia, need to wear her oxygen at night.  She has not been wearing the oxygen for the last month.  Need to establish whether she truly desaturates while sleeping.  We will perform an ONO on room air.  Baltazar Apo, MD, PhD 09/24/2017, 11:39 AM [facility] [phone] or if no answer [phone]

## 2017-09-24 NOTE — Assessment & Plan Note (Signed)
History of sleep apnea that have been previously treated with CPAP.  She had a repeat polysomnogram in 05/2016 after 90 pounds weight loss and was told that she no longer needed CPAP but that she had nocturnal hypoxemia, need to wear her oxygen at night.  She has not been wearing the oxygen for the last month.  Need to establish whether she truly desaturates while sleeping.  We will perform an ONO on room air.

## 2017-09-24 NOTE — Assessment & Plan Note (Signed)
Needs to address smoking cessation. We will discuss further next visit

## 2017-09-24 NOTE — Patient Instructions (Signed)
We will perform walking oximetry today on room air We will perform an overnight oximetry on room air while sleeping to determine whether you need to be back on oxygen We will perform full pulmonary function testing Chest x-ray today Follow with Dr Lamonte Sakai next available with full PFT on the same day

## 2017-09-25 ENCOUNTER — Ambulatory Visit (INDEPENDENT_AMBULATORY_CARE_PROVIDER_SITE_OTHER): Payer: Medicare Other | Admitting: Emergency Medicine

## 2017-09-25 DIAGNOSIS — R0600 Dyspnea, unspecified: Secondary | ICD-10-CM | POA: Diagnosis not present

## 2017-09-25 LAB — PULMONARY FUNCTION TEST
DL/VA % PRED: 77 %
DL/VA: 3.73 ml/min/mmHg/L
DLCO unc % pred: 66 %
DLCO unc: 16.15 ml/min/mmHg
FEF 25-75 Post: 1.88 L/sec
FEF 25-75 Pre: 0.98 L/sec
FEF2575-%Change-Post: 92 %
FEF2575-%Pred-Post: 108 %
FEF2575-%Pred-Pre: 56 %
FEV1-%CHANGE-POST: 19 %
FEV1-%PRED-POST: 81 %
FEV1-%PRED-PRE: 68 %
FEV1-POST: 1.77 L
FEV1-PRE: 1.48 L
FEV1FVC-%Change-Post: 4 %
FEV1FVC-%Pred-Pre: 93 %
FEV6-%Change-Post: 12 %
FEV6-%PRED-POST: 86 %
FEV6-%Pred-Pre: 77 %
FEV6-POST: 2.37 L
FEV6-Pre: 2.11 L
FEV6FVC-%CHANGE-POST: -1 %
FEV6FVC-%PRED-POST: 103 %
FEV6FVC-%Pred-Pre: 105 %
FVC-%Change-Post: 14 %
FVC-%PRED-PRE: 73 %
FVC-%Pred-Post: 83 %
FVC-POST: 2.4 L
FVC-PRE: 2.11 L
POST FEV6/FVC RATIO: 98 %
PRE FEV6/FVC RATIO: 100 %
Post FEV1/FVC ratio: 74 %
Pre FEV1/FVC ratio: 70 %
RV % pred: 151 %
RV: 3.43 L
TLC % PRED: 112 %
TLC: 5.67 L

## 2017-09-25 NOTE — Progress Notes (Signed)
PFT completed 09/25/17  

## 2017-09-26 DIAGNOSIS — G4733 Obstructive sleep apnea (adult) (pediatric): Secondary | ICD-10-CM | POA: Diagnosis not present

## 2017-09-27 ENCOUNTER — Ambulatory Visit: Payer: Medicare Other | Admitting: Emergency Medicine

## 2017-09-27 ENCOUNTER — Encounter: Payer: Self-pay | Admitting: Emergency Medicine

## 2017-09-27 DIAGNOSIS — J449 Chronic obstructive pulmonary disease, unspecified: Secondary | ICD-10-CM | POA: Diagnosis not present

## 2017-09-27 MED ORDER — ALBUTEROL SULFATE HFA 108 (90 BASE) MCG/ACT IN AERS: 2.0000 | INHALATION_SPRAY | RESPIRATORY_TRACT | 5 refills | Status: DC | PRN

## 2017-09-27 MED ORDER — TIOTROPIUM BROMIDE-OLODATEROL 2.5-2.5 MCG/ACT IN AERS: 2.0000 | INHALATION_SPRAY | Freq: Every day | RESPIRATORY_TRACT | 0 refills | Status: DC

## 2017-09-27 NOTE — Assessment & Plan Note (Signed)
Moderately severe obstruction noted on pulmonary function testing, positive bronchodilator response.  I like to try her on Stiolto to see if she benefits.  Also discussed starting albuterol as needed.  She will call us to let us know if the Stiolto is beneficial.  If so then we will order through her pharmacy.

## 2017-09-27 NOTE — Assessment & Plan Note (Signed)
Discussed the importance of smoking cessation with her today.  She is currently smoking 10 to 15 cigarettes daily.  We set a goal to decrease to 9 cigarettes daily by our next visit.

## 2017-09-27 NOTE — Patient Instructions (Addendum)
We will try starting Stiolto 2 puffs once a day.  If you benefit from this medication then we will order it through your pharmacy.  Please call us to let us know We will order albuterol for you to use 2 puffs up to every 4 hours if needed for shortness of breath, wheezing, chest tightness. You need to work on decreasing your cigarettes. We have agreed to decrease to 9 cigarettes daily by your next visit.  We will follow up on your overnight oxygen testing.  Follow with Dr Lamonte Sakai in 4 months or sooner if you have any problems.

## 2017-09-27 NOTE — Progress Notes (Signed)
Subjective:    Patient ID: [name], female    DOB: [birth year], 72 y.o.   MRN: [id number]  Shortness of Breath  Associated symptoms include chest pain and headaches. Pertinent negatives include no ear pain, fever, leg swelling, rash, rhinorrhea, sore throat, vomiting or wheezing.   72 year old active smoker (57 + pack years), with a history of coronary artery disease/CABG, carotid artery disease,, mesenteric arterial disease, hypertension with diastolic CHF, diabetes, obstructive sleep apnea improved on most recent PSG 05/2016 after 90 lbs wt loss. She was told that she still needed nocturnal O2, has not been wearing it.  She has been having more trouble this Winter and into the Spring. Was experiencing dizziness, lost appetite, poor sleep, minimal breathing changes. She then had to go to the [facility] ED in April for chest tightness and dyspnea. She had CXR and CT chest but doesn't know results. She suspected her heart disease as the cause. She received steroids and BD in the ED., ? How much they helped. She is referred to evaluate for underlying obstructive lung disease.   ROV 09/27/17 --follow-up visit for history of tobacco, dyspnea with a history as outlined above.  She did not desaturate with exertion, had to stop after one lap.  She underwent pulmonary function testing on 09/25/2017 that I have reviewed.  This shows moderately severe obstruction with a positive bronchodilator response, hyperinflated lung volumes and a decreased diffusion capacity that did not correct when adjusted for alveolar volume. She continues to smoke about 10-15/day. Room air ONO is pending.     Review of Systems  Constitutional: Negative for fever and unexpected weight change.  HENT: Negative for congestion, dental problem, ear pain, nosebleeds, postnasal drip, rhinorrhea, sinus pressure, sneezing, sore throat and trouble swallowing.   Eyes: Negative for redness and itching.  Respiratory: Positive for cough  and shortness of breath. Negative for chest tightness and wheezing.   Cardiovascular: Positive for chest pain and palpitations. Negative for leg swelling.  Gastrointestinal: Negative for nausea and vomiting.  Genitourinary: Negative for dysuria.  Musculoskeletal: Negative for joint swelling.  Skin: Negative for rash.  Neurological: Positive for headaches.  Hematological: Does not bruise/bleed easily.  Psychiatric/Behavioral: Positive for dysphoric mood. The patient is nervous/anxious.     Past Medical History:  Diagnosis Date  . Acute ischemic colitis ([place]) 09/11/2005  . Anxiety   . Arthritis   . Atherosclerotic vascular disease    Calcified plaque at the origin of the celiac and SMA  . CHF (congestive heart failure) ([place])   . Coronary atherosclerosis of native coronary artery    Mulitvessel LVEF 50-50%, DES circ 1/11, occluded SVG to OM and SVG to diagonal , LVEF 60%  . Depression   . Diverticulosis of colon   . Essential hypertension, benign   . Hypothyroidism   . Mesenteric ischemia ([place])   . Mitral regurgitation    Mild to moderate  . Mixed hyperlipidemia   . Myocardial infarction ([place]) 1990  . Obesity   . Orthostatic hypotension   . PAD (peripheral artery disease) ([place])   . PVC's (premature ventricular contractions)   . Schizophrenia ([place])   . Sleep apnea    CPAP  . Tubular adenoma   . Type 2 diabetes mellitus (HCC)      Family History  Problem Relation Age of Onset  . Alcohol abuse Mother   . Alcohol abuse Father   . Coronary artery disease Unknown   . Hypertension Unknown   . Crohn's disease  Sister 35  . Stroke Daughter      Social History   Socioeconomic History  . Marital status: Widowed    Spouse name: Not on file  . Number of children: 4  . Years of education: Not on file  . Highest education level: Not on file  Occupational History  . Occupation: DISABLED    Employer: UNEMPLOYED  Social Needs  . Financial resource strain: Not on file  . Food  insecurity:    Worry: Not on file    Inability: Not on file  . Transportation needs:    Medical: Not on file    Non-medical: Not on file  Tobacco Use  . Smoking status: Current Every Day Smoker    Packs/day: 0.50    Years: 50.00    Pack years: 25.00    Types: Cigarettes    Start date: 03/16/1959  . Smokeless tobacco: Never Used  Substance and Sexual Activity  . Alcohol use: No    Alcohol/week: 0.0 oz    Comment: Rare etoh 3-4 drinks per yr, hx beer heavily for 10-12 yrs, quit heavy etoh 1990  . Drug use: No  . Sexual activity: Not on file  Lifestyle  . Physical activity:    Days per week: Not on file    Minutes per session: Not on file  . Stress: Not on file  Relationships  . Social connections:    Talks on phone: Not on file    Gets together: Not on file    Attends religious service: Not on file    Active member of club or organization: Not on file    Attends meetings of clubs or organizations: Not on file    Relationship status: Not on file  . Intimate partner violence:    Fear of current or ex partner: Not on file    Emotionally abused: Not on file    Physically abused: Not on file    Forced sexual activity: Not on file  Other Topics Concern  . Not on file  Social History Narrative   LIves alone     No Known Allergies   Outpatient Medications Prior to Visit  Medication Sig Dispense Refill  . amLODipine (NORVASC) 5 MG tablet Take 5 mg by mouth daily.    . busPIRone (BUSPAR) 15 MG tablet 15 mg 3 (three) times daily.     . clopidogrel (PLAVIX) 75 MG tablet Take 75 mg by mouth daily.      . hydrOXYzine (ATARAX/VISTARIL) 25 MG tablet Take 25 mg by mouth daily as needed.    . metFORMIN (GLUCOPHAGE) 500 MG tablet Take 500 mg by mouth 2 (two) times daily with a meal.    . metoprolol (LOPRESSOR) 50 MG tablet Take 50 mg by mouth 2 (two) times daily.     . Multiple Vitamins-Minerals (MULTIVITAMIN ADULT PO) Take by mouth.    . nitroGLYCERIN (NITROSTAT) 0.4 MG SL tablet  Place 1 tablet (0.4 mg total) under the tongue every 5 (five) minutes as needed. 90 tablet 3  . oxyCODONE-acetaminophen (PERCOCET) 7.5-325 MG per tablet Take 1 tablet by mouth every 8 (eight) hours as needed. Pain.    . pravastatin (PRAVACHOL) 80 MG tablet Take 80 mg by mouth daily.      . ramelteon (ROZEREM) 8 MG tablet Take 8 mg by mouth at bedtime.    . torsemide (DEMADEX) 20 MG tablet Take 40 mg by mouth as needed.     No facility-administered medications prior to visit.  Objective:   Physical Exam Vitals:   09/27/17 0956  BP: (!) 142/80  Pulse: 61  SpO2: 97%  Weight: 196 lb (88.9 kg)  Height: 5\' 4"  (1.626 m)   Gen: Pleasant, overweight woman, in no distress,  normal affect  ENT: No lesions,  mouth clear,  oropharynx clear, no postnasal drip  Neck: No JVD, no stridor  Lungs: No use of accessory muscles, clear bilaterally, no wheezing, no crackles  Cardiovascular: Regular, borderline bradycardic, no significant edema  Musculoskeletal: No deformities, no cyanosis or clubbing  Neuro: alert, non focal  Skin: Warm, no lesions or rash      Assessment & Plan:  OSA (obstructive sleep apnea) No noted on her most recent polysomnogram.  Unclear whether she still desaturates at night.  An ONO is pending  TOBACCO USER Discussed the importance of smoking cessation with her today.  She is currently smoking 10 to 15 cigarettes daily.  We set a goal to decrease to 9 cigarettes daily by our next visit.  COPD (chronic obstructive pulmonary disease) (HCC) Moderately severe obstruction noted on pulmonary function testing, positive bronchodilator response.  I like to try her on Stiolto to see if she benefits.  Also discussed starting albuterol as needed.  She will call us to let us know if the Stiolto is beneficial.  If so then we will order through her pharmacy.  Baltazar Apo, MD, PhD 09/27/2017, 10:32 AM [facility] [phone] or if no answer  [phone]

## 2017-09-27 NOTE — Assessment & Plan Note (Signed)
No noted on her most recent polysomnogram.  Unclear whether she still desaturates at night.  An ONO is pending

## 2017-09-30 NOTE — Telephone Encounter (Signed)
I called listed number for [name]- pt`s grand daughter but was unable to leave message as mailbox was full.  Kindly let her know that MRI scan of the brain showed no acute abnormality or definite strokes.  MRA of the neck showed no major blockages in either carotid arteries in the neck going to the brain.  There was a blockage in the carotid artery going to the face on the right side which does not increase stroke risk.  The MRA of the brain showed no major blockages of the blood vessels either in the front of the back.  Overall no worrisome finding.  Advised her to call me if there are further questions.

## 2017-10-01 NOTE — Telephone Encounter (Signed)
Rn gave patients granddaughter Dr. Leonie Man below results of MRI brain, MRi head, MRi neck. The daughter verbalized understanding.

## 2017-10-03 ENCOUNTER — Encounter: Payer: Self-pay | Admitting: Emergency Medicine

## 2017-10-03 DIAGNOSIS — J449 Chronic obstructive pulmonary disease, unspecified: Secondary | ICD-10-CM | POA: Diagnosis not present

## 2017-10-03 DIAGNOSIS — R0902 Hypoxemia: Secondary | ICD-10-CM | POA: Diagnosis not present

## 2017-10-25 DIAGNOSIS — R3915 Urgency of urination: Secondary | ICD-10-CM | POA: Diagnosis not present

## 2017-10-25 DIAGNOSIS — R3912 Poor urinary stream: Secondary | ICD-10-CM | POA: Diagnosis not present

## 2017-10-27 DIAGNOSIS — I712 Thoracic aortic aneurysm, without rupture: Secondary | ICD-10-CM | POA: Diagnosis not present

## 2017-10-27 DIAGNOSIS — R112 Nausea with vomiting, unspecified: Secondary | ICD-10-CM | POA: Diagnosis not present

## 2017-10-27 DIAGNOSIS — E119 Type 2 diabetes mellitus without complications: Secondary | ICD-10-CM | POA: Diagnosis not present

## 2017-10-27 DIAGNOSIS — I509 Heart failure, unspecified: Secondary | ICD-10-CM | POA: Diagnosis not present

## 2017-10-27 DIAGNOSIS — Z7984 Long term (current) use of oral hypoglycemic drugs: Secondary | ICD-10-CM | POA: Diagnosis not present

## 2017-10-27 DIAGNOSIS — Z87442 Personal history of urinary calculi: Secondary | ICD-10-CM | POA: Diagnosis not present

## 2017-10-27 DIAGNOSIS — Z7982 Long term (current) use of aspirin: Secondary | ICD-10-CM | POA: Diagnosis not present

## 2017-10-27 DIAGNOSIS — I499 Cardiac arrhythmia, unspecified: Secondary | ICD-10-CM | POA: Diagnosis not present

## 2017-10-27 DIAGNOSIS — Z7902 Long term (current) use of antithrombotics/antiplatelets: Secondary | ICD-10-CM | POA: Diagnosis not present

## 2017-10-27 DIAGNOSIS — Z79899 Other long term (current) drug therapy: Secondary | ICD-10-CM | POA: Diagnosis not present

## 2017-10-27 DIAGNOSIS — J449 Chronic obstructive pulmonary disease, unspecified: Secondary | ICD-10-CM | POA: Diagnosis not present

## 2017-10-27 DIAGNOSIS — R262 Difficulty in walking, not elsewhere classified: Secondary | ICD-10-CM | POA: Diagnosis not present

## 2017-10-27 DIAGNOSIS — Z808 Family history of malignant neoplasm of other organs or systems: Secondary | ICD-10-CM | POA: Diagnosis not present

## 2017-10-27 DIAGNOSIS — I2510 Atherosclerotic heart disease of native coronary artery without angina pectoris: Secondary | ICD-10-CM | POA: Diagnosis not present

## 2017-10-27 DIAGNOSIS — R0789 Other chest pain: Secondary | ICD-10-CM | POA: Diagnosis not present

## 2017-10-27 DIAGNOSIS — Z8673 Personal history of transient ischemic attack (TIA), and cerebral infarction without residual deficits: Secondary | ICD-10-CM | POA: Diagnosis not present

## 2017-10-27 DIAGNOSIS — R55 Syncope and collapse: Secondary | ICD-10-CM | POA: Diagnosis not present

## 2017-10-27 DIAGNOSIS — G450 Vertebro-basilar artery syndrome: Secondary | ICD-10-CM | POA: Diagnosis not present

## 2017-10-27 DIAGNOSIS — G4733 Obstructive sleep apnea (adult) (pediatric): Secondary | ICD-10-CM | POA: Diagnosis not present

## 2017-10-27 DIAGNOSIS — E7800 Pure hypercholesterolemia, unspecified: Secondary | ICD-10-CM | POA: Diagnosis not present

## 2017-10-27 DIAGNOSIS — I252 Old myocardial infarction: Secondary | ICD-10-CM | POA: Diagnosis not present

## 2017-10-27 DIAGNOSIS — I110 Hypertensive heart disease with heart failure: Secondary | ICD-10-CM | POA: Diagnosis not present

## 2017-10-27 DIAGNOSIS — Z951 Presence of aortocoronary bypass graft: Secondary | ICD-10-CM | POA: Diagnosis not present

## 2017-10-27 DIAGNOSIS — Z888 Allergy status to other drugs, medicaments and biological substances status: Secondary | ICD-10-CM | POA: Diagnosis not present

## 2017-10-28 ENCOUNTER — Telehealth: Payer: Self-pay | Admitting: Cardiology

## 2017-10-28 ENCOUNTER — Ambulatory Visit: Payer: Medicare Other | Admitting: Neurology

## 2017-10-28 DIAGNOSIS — G450 Vertebro-basilar artery syndrome: Secondary | ICD-10-CM | POA: Diagnosis not present

## 2017-10-28 DIAGNOSIS — E119 Type 2 diabetes mellitus without complications: Secondary | ICD-10-CM | POA: Diagnosis not present

## 2017-10-28 DIAGNOSIS — R55 Syncope and collapse: Secondary | ICD-10-CM | POA: Diagnosis not present

## 2017-10-28 DIAGNOSIS — I719 Aortic aneurysm of unspecified site, without rupture: Secondary | ICD-10-CM | POA: Diagnosis not present

## 2017-10-28 DIAGNOSIS — R110 Nausea: Secondary | ICD-10-CM | POA: Diagnosis not present

## 2017-10-28 NOTE — Telephone Encounter (Signed)
Apt made for patient to be seen in [place] office.She is still in [facility] as of  11 am today.

## 2017-10-28 NOTE — Telephone Encounter (Signed)
error 

## 2017-10-28 NOTE — Telephone Encounter (Signed)
Patient is currently admitted at [facility] that she was advised to be referred to [facility]. Can Dr.McDowell make this referral? / tg

## 2017-10-28 NOTE — Telephone Encounter (Signed)
Please call patient's granddaughter due to patient being in hospital. / tg

## 2017-10-29 ENCOUNTER — Telehealth: Payer: Self-pay | Admitting: Cardiology

## 2017-10-29 ENCOUNTER — Telehealth: Payer: Self-pay | Admitting: Emergency Medicine

## 2017-10-29 NOTE — Telephone Encounter (Signed)
Requesting referral to [facility] prior to being seen on 7/8. / tg

## 2017-10-29 NOTE — Telephone Encounter (Signed)
Called and spoke to [name], patient's granddaughter on [place]. She shared that patient was recently in the ED in [place] and needs to see a vascular surgeon due to blockages in neck. She stated that patient has an appointment with her cardiologist coming up on 11/04/17 who is going to refer to a vascular surgeon. She wanted to pass along this information to Dr. Lamonte Sakai. She was concerned that patient's blood oxygen was low but reports that her oxygen saturation via finger probe were normal. [name] states she thinks she needs someone from pulmonary to see patient. Offered her appointment with NPs but she will talk with patient and see what patient would like to do.   Routing to RB as FYI.

## 2017-10-29 NOTE — Telephone Encounter (Signed)
Granddaughter understands that she needs to be evaluated by Dr.McDowell first and then any referrals will be made at apt

## 2017-10-31 NOTE — Progress Notes (Signed)
Cardiology Office Note  Date: 11/04/2017   ID: [name], DOB [birth year], MRN [id number]  PCP: Manon Hilding, MD  Primary Cardiologist: Rozann Lesches, MD   Chief Complaint  Patient presents with  . Coronary Artery Disease    History of Present Illness: [name] is a 72 y.o. female last seen in May.  She presents for a follow-up visit.  She states that she was seen at [facility] about a week ago with a feeling of "floating," tingling in her left arm and face, difficulty opening her left eye.  She was worried that she was having a stroke.  She states that she was observed, did undergo testing including what sounds like a head CT and CTA and was told that she had some "blockages" in her "neck vessels."  It does not sound like she was told that she had an actual stroke however.  Of note carotid Dopplers in March of this year showed only mild to moderate ICA stenoses, report reviewed below.  She has seen Dr. Leonie Man for neurological evaluation, neuroimaging studies are outlined in the previous note from May including brain and neck MRA/MRI.  From a cardiac perspective she reports no progressive angina symptoms or increasing nitroglycerin use.  Current regimen includes Plavix, Norvasc, Lopressor, Demadex, Pravachol, and as needed nitroglycerin.  We discussed smoking cessation.  Past Medical History:  Diagnosis Date  . Acute ischemic colitis ([place]) 09/11/2005  . Anxiety   . Arthritis   . Atherosclerotic vascular disease    Calcified plaque at the origin of the celiac and SMA  . CHF (congestive heart failure) ([place])   . Coronary atherosclerosis of native coronary artery    Mulitvessel LVEF 50-50%, DES circ 1/11, occluded SVG to OM and SVG to diagonal , LVEF 60%  . Depression   . Diverticulosis of colon   . Essential hypertension, benign   . Hypothyroidism   . Mesenteric ischemia ([place])   . Mitral regurgitation    Mild to moderate  . Mixed  hyperlipidemia   . Myocardial infarction ([place]) 1990  . Obesity   . Orthostatic hypotension   . PAD (peripheral artery disease) ([place])   . PVC's (premature ventricular contractions)   . Schizophrenia ([place])   . Sleep apnea    CPAP  . Tubular adenoma   . Type 2 diabetes mellitus ([place])     Past Surgical History:  Procedure Laterality Date  . BACK SURGERY     Lumbar spine surgery  . Carotid endarectomy Bilateral   . CARPAL TUNNEL RELEASE     Bilateral  . CATARACT EXTRACTION W/PHACO Right 08/24/2013   Procedure: CATARACT EXTRACTION PHACO AND INTRAOCULAR LENS PLACEMENT (IOC);  Surgeon: Tonny Branch, MD;  Location: [facility];  Service: Ophthalmology;  Laterality: Right;  CDE 8.68  . COLONOSCOPY  09/14/2005   Dr. Leonard Schwartz colitis  . COLONOSCOPY WITH ESOPHAGOGASTRODUODENOSCOPY (EGD)  04/14/2012   Dr. Gala Romney- EGD= gastric erosions of doubtful clinical significance per bx- chronic inactive gastritis, benign small bowel mucosa. TCS=colonic diverticulosis, tubular adenoma  . CORONARY ARTERY BYPASS GRAFT     LIMA-LAD; SVG-OM; SVG-DX in 1995 [place]  . NECK SURGERY     Cervical laminectomy  . PARTIAL HYSTERECTOMY    . stents x4      Current Outpatient Medications  Medication Sig Dispense Refill  . albuterol (PROAIR HFA) 108 (90 Base) MCG/ACT inhaler Inhale 2 puffs into the lungs every 4 (four) hours as needed for wheezing or shortness  of breath. 1 Inhaler 5  . amLODipine (NORVASC) 5 MG tablet Take 5 mg by mouth daily.    . busPIRone (BUSPAR) 15 MG tablet 15 mg 3 (three) times daily.     . clopidogrel (PLAVIX) 75 MG tablet Take 75 mg by mouth daily.      . hydrOXYzine (ATARAX/VISTARIL) 25 MG tablet Take 25 mg by mouth daily as needed.    . metFORMIN (GLUCOPHAGE) 500 MG tablet Take 500 mg by mouth 2 (two) times daily with a meal.    . metoprolol (LOPRESSOR) 50 MG tablet Take 50 mg by mouth daily.     . Multiple Vitamins-Minerals (MULTIVITAMIN ADULT PO) Take 1 tablet by mouth daily.     .  nitroGLYCERIN (NITROSTAT) 0.4 MG SL tablet Place 1 tablet (0.4 mg total) under the tongue every 5 (five) minutes as needed. 90 tablet 3  . oxyCODONE-acetaminophen (PERCOCET) 7.5-325 MG per tablet Take 1 tablet by mouth every 8 (eight) hours as needed. Pain.    . pravastatin (PRAVACHOL) 80 MG tablet Take 80 mg by mouth daily.      . Tiotropium Bromide-Olodaterol (STIOLTO RESPIMAT) 2.5-2.5 MCG/ACT AERS Inhale 2 puffs into the lungs daily. 1 Inhaler 0  . torsemide (DEMADEX) 20 MG tablet Take 40 mg by mouth as needed.     No current facility-administered medications for this visit.    Allergies:  Patient has no known allergies.   Social History: The patient  reports that she has been smoking cigarettes.  She started smoking about 58 years ago. She has a 25.00 pack-year smoking history. She has never used smokeless tobacco. She reports that she does not drink alcohol or use drugs.   ROS:  Please see the history of present illness. Otherwise, complete review of systems is positive for fatigue, intermittent dizziness.  All other systems are reviewed and negative.   Physical Exam: VS:  BP 127/67   Pulse (!) 55   Ht 5\' 4"  (1.626 m)   Wt 199 lb (90.3 kg)   SpO2 97%   BMI 34.16 kg/m , BMI Body mass index is 34.16 kg/m.  Wt Readings from Last 3 Encounters:  11/04/17 199 lb (90.3 kg)  09/27/17 196 lb (88.9 kg)  09/24/17 196 lb (88.9 kg)    General: Chronically ill-appearing woman, no distress. HEENT: Conjunctiva and lids normal, oropharynx clear. Neck: Supple, no elevated JVP or carotid bruits, no thyromegaly. Lungs: Diminished breath sounds without wheezing, nonlabored breathing at rest. Cardiac: Regular rate and rhythm, no S3 or significant systolic murmur, no pericardial rub. Abdomen: Soft, nontender, bowel sounds present. Extremities: Trace ankle edema, distal pulses 2+. Skin: Warm and dry. Musculoskeletal: No kyphosis. Neuropsychiatric: Alert and oriented x3, affect grossly  appropriate.  ECG: I personally reviewed the tracing from 09/17/2017 which showed sinus bradycardia with poor R wave progression.  Recent Labwork:    Component Value Date/Time   CHOL 142 08/19/2017 1505   TRIG 154 (H) 08/19/2017 1505   HDL 45 08/19/2017 1505   CHOLHDL 3.2 08/19/2017 1505   LDLCALC 66 08/19/2017 1505  April 2019: BUN 11, creatinine 0.73, potassium 4.1, AST 10, ALT 8, troponin T less than 0.01, NT-proBNP 1143, hemoglobin 11.0, platelets 145  Other Studies Reviewed Today:  Chronic Dopplers 07/25/2017: 40 to 59% RICA stenosis in 1 to 34% LICA stenosis.  Echocardiogram 11/22/2014 [facility]): Mild LVH with LVEF 74-25%, diastolic dysfunction (ungraded), mild left atrial enlargement, sclerotic aortic valve without stenosis, mild mitral regurgitation, mitral annular calcification, RVSP 5mmHg.  Assessment  and Plan:  1.  Recent evaluation at [facility] for suspected neurological symptoms, although reportedly without finding of acute stroke based on patient's description to me today.  I have no records as yet.  It sounds like she underwent a head CT and CTA and was told that she might need to see a vascular specialist.  We will request these records.  Carotid Dopplers from March showed only mild to moderate ICA stenoses however.  She is on Plavix and statin at this time.  She has also had neurological evaluation by Dr. Leonie Man.  2.  Multivessel CAD status post CABG with documented graft disease and plans to continue medical therapy at this point.  3.  Essential hypertension, blood pressure is well controlled today.  4.  Mixed hyperlipidemia on Pravachol.  Last LDL 66.  Current medicines were reviewed with the patient today.  Disposition: Follow-up in 6 months.  Signed, Satira Sark, MD, [facility] 11/04/2017 10:54 AM    [place] [facility] [place], [place], [place] [address] Phone: [phone]; Fax: [phone]

## 2017-11-01 ENCOUNTER — Encounter: Payer: Self-pay | Admitting: *Deleted

## 2017-11-04 ENCOUNTER — Ambulatory Visit: Payer: Medicare Other | Admitting: Cardiology

## 2017-11-04 ENCOUNTER — Encounter

## 2017-11-04 ENCOUNTER — Encounter: Payer: Self-pay | Admitting: Cardiology

## 2017-11-04 VITALS — BP 127/67 | HR 55 | Ht 64.0 in | Wt 199.0 lb

## 2017-11-04 DIAGNOSIS — I10 Essential (primary) hypertension: Secondary | ICD-10-CM | POA: Diagnosis not present

## 2017-11-04 DIAGNOSIS — I25119 Atherosclerotic heart disease of native coronary artery with unspecified angina pectoris: Secondary | ICD-10-CM

## 2017-11-04 DIAGNOSIS — E782 Mixed hyperlipidemia: Secondary | ICD-10-CM | POA: Diagnosis not present

## 2017-11-04 DIAGNOSIS — I6523 Occlusion and stenosis of bilateral carotid arteries: Secondary | ICD-10-CM

## 2017-11-04 NOTE — Patient Instructions (Signed)

## 2017-11-04 NOTE — Progress Notes (Addendum)
Records received from [facility] regarding recent hospital observation in late June.  I personally reviewed her ECG from 10/27/2017 which showed sinus rhythm with poor R wave progression and nonspecific T wave abnormalities.  Head CT reported chronic changes without acute abnormality.  CT angiography of the head and neck reported proximal RICA stenosis of 60% with calcified plaque, less than 93% proximal LICA stenosis with fibrofatty plaque, severe stenosis at the origin of the left vertebral artery with calcified plaque, and distal LICA stenosis of 50 to 70% with calcified plaque.  She also had an incidentally noted 4.1 cm ascending aortic aneurysm.  Lab work showed BUN 18, creatinine 0.93, potassium 3.3, AST 12, ALT 136, troponin T negative x3, hemoglobin 12.1, platelets 196.

## 2017-11-05 NOTE — Addendum Note (Signed)
Addended by: Acquanetta Chain on: 11/05/2017 02:39 PM   Modules accepted: Orders

## 2017-11-06 NOTE — Telephone Encounter (Signed)
Asking about status of referral to [facility]

## 2017-11-07 NOTE — Telephone Encounter (Signed)
Patient's granddaughter notified and verbalized understanding 

## 2017-11-14 ENCOUNTER — Ambulatory Visit: Payer: Medicare Other | Admitting: Neurology

## 2017-11-14 ENCOUNTER — Encounter: Payer: Self-pay | Admitting: Neurology

## 2017-11-14 VITALS — BP 122/65 | HR 75 | Ht 64.0 in | Wt 202.6 lb

## 2017-11-14 DIAGNOSIS — G459 Transient cerebral ischemic attack, unspecified: Secondary | ICD-10-CM | POA: Diagnosis not present

## 2017-11-14 NOTE — Patient Instructions (Signed)
I had a long discussion with the patient and her daughter regarding her recent TIAs and discuss findings of a recent CT angiogram and MR angiogram both of which suggests significant left vertebral artery stenosis and moderate  t right carotid restenosis. She'll likely need diagnostic cerebral catheter angiogram to further categorize his blockages to decide on revascularization. She already has an appointment to see vascular surgeon which I have advised her to keep. I strongly counseled her to quit smoking completely. Continue Plavix for stroke prevention with strict control of hypertension with blood pressure goal below 130/90, lipids with LDL cholesterol goal below 70 mg percent and diabetes with hemoglobin A1c goal below 6.5%. She'll encouraged to eat a healthy diet and to be active. She will return for follow-up in 3 months with my nurse practitioner Janett Billow are call earlier if necessary   Smoking Tobacco Information Smoking tobacco will very likely harm your health. Tobacco contains a poisonous (toxic), colorless chemical called nicotine. Nicotine affects the brain and makes tobacco addictive. This change in your brain can make it hard to stop smoking. Tobacco also has other toxic chemicals that can hurt your body and raise your risk of many cancers. How can smoking tobacco affect me? Smoking tobacco can increase your chances of having serious health conditions, such as:  Cancer. Smoking is most commonly associated with lung cancer, but can lead to cancer in other parts of the body.  Chronic obstructive pulmonary disease (COPD). This is a long-term lung condition that makes it hard to breathe. It also gets worse over time.  High blood pressure (hypertension), heart disease, stroke, or heart attack.  Lung infections, such as pneumonia.  Cataracts. This is when the lenses in the eyes become clouded.  Digestive problems. This may include peptic ulcers, heartburn, and gastroesophageal reflux  disease (GERD).  Oral health problems, such as gum disease and tooth loss.  Loss of taste and smell.  Smoking can affect your appearance by causing:  Wrinkles.  Yellow or stained teeth, fingers, and fingernails.  Smoking tobacco can also affect your social life.  Many workplaces, [employer], hotels, and public places are tobacco-free. This means that you may experience challenges in finding places to smoke when away from home.  The cost of a smoking habit can be expensive. Expenses for someone who smokes come in two ways: ? You spend money on a regular basis to buy tobacco. ? Your health care costs in the long-term are higher if you smoke.  Tobacco smoke can also affect the health of those around you. Children of smokers have greater chances of: ? Sudden infant death syndrome (SIDS). ? Ear infections. ? Lung infections.  What lifestyle changes can be made?  Do not start smoking. Quit if you already do.  To quit smoking: ? Make a plan to quit smoking and commit yourself to it. Look for programs to help you and ask your health care provider for recommendations and ideas. ? Talk with your health care provider about using nicotine replacement medicines to help you quit. Medicine replacement medicines include gum, lozenges, patches, sprays, or pills. ? Do not replace cigarette smoking with electronic cigarettes, which are commonly called e-cigarettes. The safety of e-cigarettes is not known, and some may contain harmful chemicals. ? Avoid places, people, or situations that tempt you to smoke. ? If you try to quit but return to smoking, don't give up hope. It is very common for people to try a number of times before they fully succeed. When  you feel ready again, give it another try.  Quitting smoking might affect the way you eat as well as your weight. Be prepared to monitor your eating habits. Get support in planning and following a healthy diet.  Ask your health care provider about  having regular tests (screenings) to check for cancer. This may include blood tests, imaging tests, and other tests.  Exercise regularly. Consider taking walks, joining a gym, or doing yoga or exercise classes.  Develop skills to manage your stress. These skills include meditation. What are the benefits of quitting smoking? By quitting smoking, you may:  Lower your risk of getting cancer and other diseases caused by smoking.  Live longer.  Breathe better.  Lower your blood pressure and heart rate.  Stop your addiction to tobacco.  Stop creating secondhand smoke that hurts other people.  Improve your sense of taste and smell.  Look better over time, due to having fewer wrinkles and less staining.  What can happen if changes are not made? If you do not stop smoking, you may:  Get cancer and other diseases.  Develop COPD or other long-term (chronic) lung conditions.  Develop serious problems with your heart and blood vessels (cardiovascular system).  Need more tests to screen for problems caused by smoking.  Have higher, long-term healthcare costs from medicines or treatments related to smoking.  Continue to have worsening changes in your lungs, mouth, and nose.  Where to find support: To get support to quit smoking, consider:  Asking your health care provider for more information and resources.  Taking classes to learn more about quitting smoking.  Looking for local organizations that offer resources about quitting smoking.  Joining a support group for people who want to quit smoking in your local community.  Where to find more information: You may find more information about quitting smoking from:  HelpGuide.org: www.helpguide.org/articles/addictions/how-to-quit-smoking.htm  https://hall.com/: smokefree.gov  American Lung Association: www.lung.org  Contact a health care provider if:  You have problems breathing.  Your lips, nose, or fingers turn blue.  You  have chest pain.  You are coughing up blood.  You feel faint or you pass out.  You have other noticeable changes that cause you to worry. Summary  Smoking tobacco can negatively affect your health, the health of those around you, your finances, and your social life.  Do not start smoking. Quit if you already do. If you need help quitting, ask your health care provider.  Think about joining a support group for people who want to quit smoking in your local community. There are many effective programs that will help you to quit this behavior. This information is not intended to replace advice given to you by your health care provider. Make sure you discuss any questions you have with your health care provider. Document Released: 05/01/2016 Document Revised: 05/01/2016 Document Reviewed: 05/01/2016 Elsevier Interactive Patient Education  [employer].

## 2017-11-14 NOTE — Progress Notes (Signed)
[facility] [address] [place]. [place] [address] [phone]       OFFICE CONSULT NOTE  [name] Date of Birth:  03-01-1946 Medical Record Number:  [id number]   Referring MD:  Misty Hacker, PA-c  Reason for Referral:  TIA  HPI: Initial consult 08/19/17 : [name] is a pleasant 72 year lady who is referred for evaluation for recurrent TIAs. History is obtained from the patient, granddaughter and review of referral notes from primary physician.the patient said that she's had about 4-5 episodes ofstereotypical recurrent TIAs since last 13 months. The episode begins without warning. She developed sudden onset of numbness and weakness on the left hemibody and is unable to grasp objects in her hands and his feels weak. She has to lie down and starts feeling better after 5-10 minutes. The last episode occurred on 08/13/17 when she went to upon for patient she felt tired, dizzy and nausea.. She can back and was able to make it back to her house and when she sat in a chair she noticed left side was numb and weak when face was drawn to the left. She denied any twitching or jerking movement impaired consciousness or loss of consciousness. She laid down and felt better when she got up. This episode occurred twice later on and she called EMS she was taken to the emergency room at [facility] where CT scan of the head which have personally reviewed the films was unremarkable except for mild age-related changes of chronic microvascular ischemia and atrophy. Carotid ultrasound was subsequently done a few days later in the cardiologist's office which did not show significant extracranial stenosis. The patient does have a history of remote coronary artery disease. She continues to smoke and smokes half pack per day. She states her blood pressure is under good control and today it is 107/60. She states her diabetes is also under reasonable control his fasting sugars ranging in  the 120s mainly. She has remained on antiplatelet therapy of aspirin and Plavix for several years. She denies any other episode of TIA in the form of loss of vision, slurred speech, diplopia, vertigo, gait or balance problems. Update 11/14/2017 : She returns for follow-up after last visit 3 months ago. She is accompanied by daughter. Patient stated that she had been episode on 10/28/17 when she developed left eye pain as well as drooping. She also noticed redness in the eye. She is subsequently had some left facial numbness. She was seen in the ER at [facility]. Unfortunately I do not have those results for review however I will look at CT angiogram of the brain and neck that she did which basically showed similar findings to MRA from February 2019 showing 60% right ICA, 30% left ICA and severe left vertebral origin stenosis. Left cavernous carotid at 50-70% stenosis. Vision still continues to smoke. She is tolerating Plavix well without bruising or bleeding. Her blood pressure is well controlled and today it is 122/65. Her hemoglobin A1c was 5.8 and LDL cholesterol 66 mg percent on 08/19/17. She has a remote history of right carotid surgery more than 10 years ago. She has been referred to vascular surgery to discuss repeat revascularization options. ROS:   14 system review of systems is positive for   chills, fatigue, hearing loss, eye redness, abdominal pain, nausea, shortness of breath, chest pain, leg swelling, palpitation, restless, insomnia, snoring, difficulty urinating, frequency of urination, bladder urgency, joint swelling, back pain, aching muscles muscle cramps, walking  difficulty, neck pain and stiffness, memory loss, dizziness, headache, numbness, weakness facial drooping, agitation, confusion, nervousness and anxiety and all other systems negative  PMH:  Past Medical History:  Diagnosis Date  . Acute ischemic colitis ([place]) 09/11/2005  . Anxiety   . Arthritis   . Atherosclerotic vascular  disease    Calcified plaque at the origin of the celiac and SMA  . CHF (congestive heart failure) ([place])   . Coronary atherosclerosis of native coronary artery    Mulitvessel LVEF 50-50%, DES circ 1/11, occluded SVG to OM and SVG to diagonal , LVEF 60%  . Depression   . Diverticulosis of colon   . Essential hypertension, benign   . Hypothyroidism   . Mesenteric ischemia ([place])   . Mitral regurgitation    Mild to moderate  . Mixed hyperlipidemia   . Myocardial infarction ([place]) 1990  . Obesity   . Orthostatic hypotension   . PAD (peripheral artery disease) ([place])   . PVC's (premature ventricular contractions)   . Schizophrenia ([place])   . Sleep apnea    CPAP  . Tubular adenoma   . Type 2 diabetes mellitus ([place])     Social History:  Social History   Socioeconomic History  . Marital status: Widowed    Spouse name: Not on file  . Number of children: 4  . Years of education: Not on file  . Highest education level: Not on file  Occupational History  . Occupation: DISABLED    Employer: UNEMPLOYED  Social Needs  . Financial resource strain: Not on file  . Food insecurity:    Worry: Not on file    Inability: Not on file  . Transportation needs:    Medical: Not on file    Non-medical: Not on file  Tobacco Use  . Smoking status: Current Every Day Smoker    Packs/day: 0.50    Years: 50.00    Pack years: 25.00    Types: Cigarettes    Start date: 03/16/1959  . Smokeless tobacco: Never Used  Substance and Sexual Activity  . Alcohol use: No    Alcohol/week: 0.0 oz    Comment: Rare etoh 3-4 drinks per yr, hx beer heavily for 10-12 yrs, quit heavy etoh 1990  . Drug use: No  . Sexual activity: Not on file  Lifestyle  . Physical activity:    Days per week: Not on file    Minutes per session: Not on file  . Stress: Not on file  Relationships  . Social connections:    Talks on phone: Not on file    Gets together: Not on file    Attends religious service: Not on file    Active  member of club or organization: Not on file    Attends meetings of clubs or organizations: Not on file    Relationship status: Not on file  . Intimate partner violence:    Fear of current or ex partner: Not on file    Emotionally abused: Not on file    Physically abused: Not on file    Forced sexual activity: Not on file  Other Topics Concern  . Not on file  Social History Narrative   LIves alone    Medications:   Current Outpatient Medications on File Prior to Visit  Medication Sig Dispense Refill  . albuterol (PROAIR HFA) 108 (90 Base) MCG/ACT inhaler Inhale 2 puffs into the lungs every 4 (four) hours as needed for wheezing or shortness of breath. 1 Inhaler  5  . alprazolam (XANAX) 2 MG tablet Take by mouth.    Marland Kitchen amLODipine (NORVASC) 5 MG tablet Take 5 mg by mouth daily.    . busPIRone (BUSPAR) 15 MG tablet 15 mg 3 (three) times daily.     . clopidogrel (PLAVIX) 75 MG tablet Take 75 mg by mouth daily.      . hydrOXYzine (ATARAX/VISTARIL) 25 MG tablet Take 25 mg by mouth daily as needed.    . isosorbide mononitrate (IMDUR) 60 MG 24 hr tablet     . metFORMIN (GLUCOPHAGE) 500 MG tablet Take 500 mg by mouth 2 (two) times daily with a meal.    . metoprolol (LOPRESSOR) 50 MG tablet Take 50 mg by mouth as needed.     . Multiple Vitamins-Minerals (MULTIVITAMIN ADULT PO) Take 1 tablet by mouth daily.     . nitroGLYCERIN (NITROSTAT) 0.4 MG SL tablet Place 1 tablet (0.4 mg total) under the tongue every 5 (five) minutes as needed. 90 tablet 3  . oxyCODONE-acetaminophen (PERCOCET) 7.5-325 MG per tablet Take 1 tablet by mouth every 8 (eight) hours as needed. Pain.    . pravastatin (PRAVACHOL) 80 MG tablet Take 80 mg by mouth daily.      . Tiotropium Bromide-Olodaterol (STIOLTO RESPIMAT) 2.5-2.5 MCG/ACT AERS Inhale 2 puffs into the lungs daily. 1 Inhaler 0  . torsemide (DEMADEX) 20 MG tablet Take 40 mg by mouth as needed.     No current facility-administered medications on file prior to visit.      Allergies:  No Known Allergies  Physical Exam General: well developed, well nourished elderly Caucasian lady, seated, in no evident distress Head: head normocephalic and atraumatic.   Neck: supple with no carotid or supraclavicular bruits Cardiovascular: regular rate and rhythm, no murmurs Musculoskeletal: no deformity Skin:  no rash/petichiae Vascular:  Normal pulses all extremities  Neurologic Exam Mental Status: Awake and fully alert. Oriented to place and time. Recent and remote memory intact. Attention span, concentration and fund of knowledge appropriate. Mood and affect appropriate.  Cranial Nerves: Fundoscopic exam reveals sharp disc margins. Pupils equal, briskly reactive to light. Extraocular movements full without nystagmus. Visual fields full to confrontation. Hearing intact. Facial sensation intact. Face, tongue, palate moves normally and symmetrically.  Motor: Normal bulk and tone. Normal strength in all tested extremity muscles. Sensory.: intact to touch , pinprick , position and vibratory sensation.  Coordination: Rapid alternating movements normal in all extremities. Finger-to-nose and heel-to-shin performed accurately bilaterally. Gait and Station: Arises from chair without difficulty. Stance is normal. Gait demonstrates normal stride length and balance . Able to heel, toe and tandem walk with  difficulty.  Reflexes: 1+ and symmetric. Toes downgoing.   NIHSS  0Modified Rankin  0   ASSESSMENT: 72 year old lady with recurrent episodes of left-sided numbness and weakness possibly right hemispheric TIAs with vascular risk factors of diabetes, hypertension and hyperlipidemia. Neurovascular imaging shows moderate right carotid and high-grade terminal left vertebral artery stenosis unclear as to which one is symptomatic    PLAN: I had a long discussion with the patient and her daughter regarding her recent TIAs and discuss findings of a recent CT angiogram and Misty  angiogram both of which suggests significant left vertebral artery stenosis and moderate  t right carotid restenosis. She'll likely need diagnostic cerebral catheter angiogram to further categorize his blockages to decide on revascularization. She already has an appointment to see vascular surgeon which I have advised her to keep. I strongly counseled her to quit smoking  completely. Continue Plavix for stroke prevention with strict control of hypertension with blood pressure goal below 130/90, lipids with LDL cholesterol goal below 70 mg percent and diabetes with hemoglobin A1c goal below 6.5%. She was encouraged to eat a healthy diet and to be active. She will return for follow-up in 3 months with my nurse practitioner Janett Billow are call earlier if .Greater than 50% time during this 25 minute consultation visit was spent on counseling and coordination of care about TIAs and discussion about prevention and answering questions.Followup in the future with me in 2 months or call earlier if necessary Antony Contras, MD  [facility] [address] [place], [place] [address]  Phone [phone] Fax [phone] Note: This document was prepared with digital dictation and possible smart phrase technology. Any transcriptional errors that result from this process are unintentional.

## 2017-11-20 DIAGNOSIS — G459 Transient cerebral ischemic attack, unspecified: Secondary | ICD-10-CM

## 2017-11-20 DIAGNOSIS — I6521 Occlusion and stenosis of right carotid artery: Secondary | ICD-10-CM | POA: Insufficient documentation

## 2017-11-20 HISTORY — DX: Transient cerebral ischemic attack, unspecified: G45.9

## 2017-11-21 DIAGNOSIS — N182 Chronic kidney disease, stage 2 (mild): Secondary | ICD-10-CM | POA: Diagnosis not present

## 2017-11-21 DIAGNOSIS — I2510 Atherosclerotic heart disease of native coronary artery without angina pectoris: Secondary | ICD-10-CM | POA: Diagnosis not present

## 2017-11-21 DIAGNOSIS — I10 Essential (primary) hypertension: Secondary | ICD-10-CM | POA: Diagnosis not present

## 2017-11-26 DIAGNOSIS — G4733 Obstructive sleep apnea (adult) (pediatric): Secondary | ICD-10-CM | POA: Diagnosis not present

## 2017-12-03 ENCOUNTER — Encounter: Payer: Medicare Other | Admitting: Vascular Surgery

## 2017-12-23 ENCOUNTER — Telehealth: Payer: Self-pay | Admitting: Emergency Medicine

## 2017-12-23 NOTE — Telephone Encounter (Signed)
Please let the patient know that her overnight oxygen testing on room air was normal. She doesn't need to wear oxygen at night. (Based on her sleep study she would benefit from CPAP. We could revisit in the future if she thought she could tolerate.)

## 2017-12-23 NOTE — Telephone Encounter (Signed)
Spoke with pt's granddaughter, Joelene Millin. She is aware of the pt's ONO results. Nothing further was needed at this time.

## 2017-12-24 DIAGNOSIS — R200 Anesthesia of skin: Secondary | ICD-10-CM | POA: Diagnosis not present

## 2017-12-24 DIAGNOSIS — Z79899 Other long term (current) drug therapy: Secondary | ICD-10-CM | POA: Diagnosis not present

## 2017-12-24 DIAGNOSIS — R079 Chest pain, unspecified: Secondary | ICD-10-CM | POA: Diagnosis not present

## 2017-12-24 DIAGNOSIS — I119 Hypertensive heart disease without heart failure: Secondary | ICD-10-CM | POA: Diagnosis not present

## 2017-12-24 DIAGNOSIS — R4781 Slurred speech: Secondary | ICD-10-CM | POA: Diagnosis not present

## 2017-12-24 DIAGNOSIS — G459 Transient cerebral ischemic attack, unspecified: Secondary | ICD-10-CM | POA: Diagnosis not present

## 2017-12-24 DIAGNOSIS — R29810 Facial weakness: Secondary | ICD-10-CM | POA: Diagnosis not present

## 2017-12-25 ENCOUNTER — Telehealth: Payer: Self-pay | Admitting: Cardiology

## 2017-12-25 NOTE — Telephone Encounter (Signed)
Patient's grand daughter called stated that doctor told patient that her BP is too low and that she needed to contact our office about coming some of her off BP medications

## 2017-12-26 NOTE — Telephone Encounter (Signed)
Pt is currently taking Lopressor 50 mg twice daily - should she hold this if HR goes below 50?

## 2017-12-26 NOTE — Telephone Encounter (Signed)
I appreciate the update.  Please verify her actual dose of Lopressor.  The chart has it listed as 50 mg once daily as needed which would be somewhat unusual.

## 2017-12-26 NOTE — Telephone Encounter (Signed)
Rutherford Nail - (gdaughter) returned call   [phone]. In reference to medications.

## 2017-12-26 NOTE — Telephone Encounter (Signed)
Spoke with granddaughter who says pt is now seeing Dr Earnestine Mealing neurologist from [place] and who told to hold Lopressor for HR under 50 (has had some several mornings of HR in the 40s) was also concerned with pt BP (says it was ranging 120s-130s/60s-70s did explain that this was mostly normal ranges) says she has been dizzy however had recent TIA - carotid US and and echo was ordered by DR Earnestine Mealing - granddaughter wanted to make Dr Domenic Polite aware

## 2017-12-27 DIAGNOSIS — G4733 Obstructive sleep apnea (adult) (pediatric): Secondary | ICD-10-CM | POA: Diagnosis not present

## 2017-12-27 NOTE — Telephone Encounter (Signed)
I would cut the Lopressor back to 25 mg twice daily.  If her heart rate is under 50 prior to taking the medication, would hold it as well.  She may even need a lower standing dose.

## 2017-12-27 NOTE — Telephone Encounter (Signed)
Granddaughter Joelene Millin) notified.

## 2018-01-02 DIAGNOSIS — I119 Hypertensive heart disease without heart failure: Secondary | ICD-10-CM | POA: Diagnosis not present

## 2018-01-02 DIAGNOSIS — E785 Hyperlipidemia, unspecified: Secondary | ICD-10-CM | POA: Diagnosis not present

## 2018-01-02 DIAGNOSIS — G459 Transient cerebral ischemic attack, unspecified: Secondary | ICD-10-CM | POA: Diagnosis not present

## 2018-01-02 DIAGNOSIS — I517 Cardiomegaly: Secondary | ICD-10-CM | POA: Diagnosis not present

## 2018-01-02 DIAGNOSIS — I340 Nonrheumatic mitral (valve) insufficiency: Secondary | ICD-10-CM | POA: Diagnosis not present

## 2018-01-06 DIAGNOSIS — I499 Cardiac arrhythmia, unspecified: Secondary | ICD-10-CM | POA: Diagnosis not present

## 2018-01-10 DIAGNOSIS — I440 Atrioventricular block, first degree: Secondary | ICD-10-CM | POA: Diagnosis not present

## 2018-01-15 DIAGNOSIS — I491 Atrial premature depolarization: Secondary | ICD-10-CM | POA: Diagnosis not present

## 2018-01-15 DIAGNOSIS — G4709 Other insomnia: Secondary | ICD-10-CM | POA: Diagnosis not present

## 2018-01-15 DIAGNOSIS — N182 Chronic kidney disease, stage 2 (mild): Secondary | ICD-10-CM | POA: Diagnosis not present

## 2018-01-15 DIAGNOSIS — Z23 Encounter for immunization: Secondary | ICD-10-CM | POA: Diagnosis not present

## 2018-01-15 DIAGNOSIS — I471 Supraventricular tachycardia: Secondary | ICD-10-CM | POA: Diagnosis not present

## 2018-01-15 DIAGNOSIS — I499 Cardiac arrhythmia, unspecified: Secondary | ICD-10-CM | POA: Diagnosis not present

## 2018-01-15 DIAGNOSIS — R42 Dizziness and giddiness: Secondary | ICD-10-CM | POA: Diagnosis not present

## 2018-01-15 DIAGNOSIS — E162 Hypoglycemia, unspecified: Secondary | ICD-10-CM | POA: Diagnosis not present

## 2018-01-15 DIAGNOSIS — M545 Low back pain: Secondary | ICD-10-CM | POA: Diagnosis not present

## 2018-01-27 DIAGNOSIS — G4733 Obstructive sleep apnea (adult) (pediatric): Secondary | ICD-10-CM | POA: Diagnosis not present

## 2018-02-17 ENCOUNTER — Ambulatory Visit: Payer: Medicare Other | Admitting: Adult Health

## 2018-02-26 DIAGNOSIS — G4733 Obstructive sleep apnea (adult) (pediatric): Secondary | ICD-10-CM | POA: Diagnosis not present

## 2018-03-29 DIAGNOSIS — G4733 Obstructive sleep apnea (adult) (pediatric): Secondary | ICD-10-CM | POA: Diagnosis not present

## 2018-04-01 DIAGNOSIS — J029 Acute pharyngitis, unspecified: Secondary | ICD-10-CM | POA: Diagnosis not present

## 2018-04-01 DIAGNOSIS — J209 Acute bronchitis, unspecified: Secondary | ICD-10-CM | POA: Diagnosis not present

## 2018-04-28 DIAGNOSIS — G4733 Obstructive sleep apnea (adult) (pediatric): Secondary | ICD-10-CM | POA: Diagnosis not present

## 2018-05-02 ENCOUNTER — Encounter: Payer: Self-pay | Admitting: *Deleted

## 2018-05-05 ENCOUNTER — Ambulatory Visit: Payer: Medicare Other | Admitting: Cardiology

## 2018-05-05 DIAGNOSIS — E870 Hyperosmolality and hypernatremia: Secondary | ICD-10-CM | POA: Diagnosis not present

## 2018-05-05 DIAGNOSIS — N182 Chronic kidney disease, stage 2 (mild): Secondary | ICD-10-CM | POA: Diagnosis not present

## 2018-05-05 DIAGNOSIS — R0902 Hypoxemia: Secondary | ICD-10-CM | POA: Diagnosis not present

## 2018-05-05 DIAGNOSIS — E876 Hypokalemia: Secondary | ICD-10-CM | POA: Diagnosis not present

## 2018-05-05 DIAGNOSIS — E162 Hypoglycemia, unspecified: Secondary | ICD-10-CM | POA: Diagnosis not present

## 2018-05-05 DIAGNOSIS — I10 Essential (primary) hypertension: Secondary | ICD-10-CM | POA: Diagnosis not present

## 2018-05-05 NOTE — Progress Notes (Deleted)
Cardiology Office Note  Date: 05/05/2018   ID: [name], DOB 1945-10-23, MRN [id number]  PCP: Manon Hilding, MD  Primary Cardiologist: Rozann Lesches, MD   No chief complaint on file.   History of Present Illness: [name] is a 73 y.o. female last seen in July.  Imaging from [facility] from last summer was reviewed. CT angiography of the head and neck reported proximal RICA stenosis of 60% with calcified plaque, less than 69% proximal LICA stenosis with fibrofatty plaque, severe stenosis at the origin of the left vertebral artery with calcified plaque, and distal LICA stenosis of 50 to 70% with calcified plaque. She also had an incidentally noted 4.1 cm ascending aortic aneurysm.  Of note, patient was seen by Dr. Leonie Man in July 2019 with discussion of her neuroimaging studies.  Past Medical History:  Diagnosis Date  . Acute ischemic colitis ([place]) 09/11/2005  . Anxiety   . Arthritis   . Atherosclerotic vascular disease    Calcified plaque at the origin of the celiac and SMA  . CHF (congestive heart failure) ([place])   . Coronary atherosclerosis of native coronary artery    Mulitvessel LVEF 50-50%, DES circ 1/11, occluded SVG to OM and SVG to diagonal , LVEF 60%  . Depression   . Diverticulosis of colon   . Essential hypertension, benign   . Hypothyroidism   . Mesenteric ischemia ([place])   . Mitral regurgitation    Mild to moderate  . Mixed hyperlipidemia   . Myocardial infarction ([place]) 1990  . Obesity   . Orthostatic hypotension   . PAD (peripheral artery disease) ([place])   . PVC's (premature ventricular contractions)   . Schizophrenia ([place])   . Sleep apnea    CPAP  . Tubular adenoma   . Type 2 diabetes mellitus ([place])     Past Surgical History:  Procedure Laterality Date  . BACK SURGERY     Lumbar spine surgery  . Carotid endarectomy Bilateral   . CARPAL TUNNEL RELEASE     Bilateral  . CATARACT EXTRACTION W/PHACO Right 08/24/2013   Procedure: CATARACT EXTRACTION PHACO AND INTRAOCULAR LENS PLACEMENT (IOC);  Surgeon: Tonny Branch, MD;  Location: [facility];  Service: Ophthalmology;  Laterality: Right;  CDE 8.68  . COLONOSCOPY  09/14/2005   Dr. Leonard Schwartz colitis  . COLONOSCOPY WITH ESOPHAGOGASTRODUODENOSCOPY (EGD)  04/14/2012   Dr. Gala Romney- EGD= gastric erosions of doubtful clinical significance per bx- chronic inactive gastritis, benign small bowel mucosa. TCS=colonic diverticulosis, tubular adenoma  . CORONARY ARTERY BYPASS GRAFT     LIMA-LAD; SVG-OM; SVG-DX in 1995 [place]  . NECK SURGERY     Cervical laminectomy  . PARTIAL HYSTERECTOMY    . stents x4      Current Outpatient Medications  Medication Sig Dispense Refill  . albuterol (PROAIR HFA) 108 (90 Base) MCG/ACT inhaler Inhale 2 puffs into the lungs every 4 (four) hours as needed for wheezing or shortness of breath. 1 Inhaler 5  . alprazolam (XANAX) 2 MG tablet Take by mouth.    Marland Kitchen amLODipine (NORVASC) 5 MG tablet Take 5 mg by mouth daily.    . busPIRone (BUSPAR) 15 MG tablet 15 mg 3 (three) times daily.     . clopidogrel (PLAVIX) 75 MG tablet Take 75 mg by mouth daily.      . hydrOXYzine (ATARAX/VISTARIL) 25 MG tablet Take 25 mg by mouth daily as needed.    . isosorbide mononitrate (IMDUR) 60 MG 24 hr tablet     .  metFORMIN (GLUCOPHAGE) 500 MG tablet Take 500 mg by mouth 2 (two) times daily with a meal.    . metoprolol (LOPRESSOR) 50 MG tablet Take 25 mg by mouth 2 (two) times daily. Hold if hr below 50    . Multiple Vitamins-Minerals (MULTIVITAMIN ADULT PO) Take 1 tablet by mouth daily.     . nitroGLYCERIN (NITROSTAT) 0.4 MG SL tablet Place 1 tablet (0.4 mg total) under the tongue every 5 (five) minutes as needed. 90 tablet 3  . oxyCODONE-acetaminophen (PERCOCET) 7.5-325 MG per tablet Take 1 tablet by mouth every 8 (eight) hours as needed. Pain.    . pravastatin (PRAVACHOL) 80 MG tablet Take 80 mg by mouth daily.      . Tiotropium Bromide-Olodaterol (STIOLTO  RESPIMAT) 2.5-2.5 MCG/ACT AERS Inhale 2 puffs into the lungs daily. 1 Inhaler 0  . torsemide (DEMADEX) 20 MG tablet Take 40 mg by mouth as needed.     No current facility-administered medications for this visit.    Allergies:  Patient has no known allergies.   Social History: The patient  reports that she has been smoking cigarettes. She started smoking about 59 years ago. She has a 25.00 pack-year smoking history. She has never used smokeless tobacco. She reports that she does not drink alcohol or use drugs.   Family History: The patient's family history includes Alcohol abuse in her father and mother; Coronary artery disease in an other family member; Crohn's disease (age of onset: 40) in her sister; Hypertension in an other family member; Stroke in her daughter.   ROS:  Please see the history of present illness. Otherwise, complete review of systems is positive for {NONE DEFAULTED:18576::"none"}.  All other systems are reviewed and negative.   Physical Exam: VS:  There were no vitals taken for this visit., BMI There is no height or weight on file to calculate BMI.  Wt Readings from Last 3 Encounters:  11/14/17 202 lb 9.6 oz (91.9 kg)  11/04/17 199 lb (90.3 kg)  09/27/17 196 lb (88.9 kg)    General: Patient appears comfortable at rest. HEENT: Conjunctiva and lids normal, oropharynx clear with moist mucosa. Neck: Supple, no elevated JVP or carotid bruits, no thyromegaly. Lungs: Clear to auscultation, nonlabored breathing at rest. Cardiac: Regular rate and rhythm, no S3 or significant systolic murmur, no pericardial rub. Abdomen: Soft, nontender, no hepatomegaly, bowel sounds present, no guarding or rebound. Extremities: No pitting edema, distal pulses 2+. Skin: Warm and dry. Musculoskeletal: No kyphosis. Neuropsychiatric: Alert and oriented x3, affect grossly appropriate.  ECG: I personally reviewed the tracing from 10/27/2017 which showed sinus rhythm with poor R wave progression and  nonspecific T wave abnormalities.  Recent Labwork:    Component Value Date/Time   CHOL 142 08/19/2017 1505   TRIG 154 (H) 08/19/2017 1505   HDL 45 08/19/2017 1505   CHOLHDL 3.2 08/19/2017 1505   LDLCALC 66 08/19/2017 1505  June 2019: BUN 18, creatinine 0.93, potassium 3.3, AST 12, ALT 136, troponin T negative x3, hemoglobin 12.1, platelets 196  Other Studies Reviewed Today:  Chronic Dopplers 07/25/2017: 40 to 59% RICA stenosis in 1 to 39% LICA stenosis.  Echocardiogram 11/22/2014 [facility]): Mild LVH with LVEF 03-00%, diastolic dysfunction (ungraded), mild left atrial enlargement, sclerotic aortic valve without stenosis, mild mitral regurgitation, mitral annular calcification, RVSP 22mmHg.  Assessment and Plan:    Current medicines were reviewed with the patient today.  No orders of the defined types were placed in this encounter.   Disposition:  Signed, Mikeal Hawthorne  Delman Kitten, MD, [facility] 05/05/2018 9:00 [place] at [place], [place], [place] [address] Phone: [phone]; Fax: [phone]

## 2018-05-07 DIAGNOSIS — N182 Chronic kidney disease, stage 2 (mild): Secondary | ICD-10-CM | POA: Diagnosis not present

## 2018-05-07 DIAGNOSIS — G4709 Other insomnia: Secondary | ICD-10-CM | POA: Diagnosis not present

## 2018-05-07 DIAGNOSIS — M545 Low back pain: Secondary | ICD-10-CM | POA: Diagnosis not present

## 2018-05-29 DIAGNOSIS — G4733 Obstructive sleep apnea (adult) (pediatric): Secondary | ICD-10-CM | POA: Diagnosis not present

## 2018-06-05 ENCOUNTER — Other Ambulatory Visit: Payer: Self-pay | Admitting: Cardiology

## 2018-06-05 DIAGNOSIS — I6523 Occlusion and stenosis of bilateral carotid arteries: Secondary | ICD-10-CM

## 2018-06-16 ENCOUNTER — Other Ambulatory Visit: Payer: Self-pay | Admitting: Cardiology

## 2018-06-20 ENCOUNTER — Encounter: Payer: Self-pay | Admitting: Cardiology

## 2018-06-20 ENCOUNTER — Ambulatory Visit: Payer: Medicare Other | Admitting: Cardiology

## 2018-06-20 VITALS — BP 149/77 | HR 54 | Ht 64.0 in | Wt 225.8 lb

## 2018-06-20 DIAGNOSIS — I25119 Atherosclerotic heart disease of native coronary artery with unspecified angina pectoris: Secondary | ICD-10-CM

## 2018-06-20 DIAGNOSIS — E782 Mixed hyperlipidemia: Secondary | ICD-10-CM | POA: Diagnosis not present

## 2018-06-20 DIAGNOSIS — I77810 Thoracic aortic ectasia: Secondary | ICD-10-CM

## 2018-06-20 DIAGNOSIS — I10 Essential (primary) hypertension: Secondary | ICD-10-CM

## 2018-06-20 NOTE — Patient Instructions (Addendum)

## 2018-06-20 NOTE — Progress Notes (Signed)
Cardiology Office Note  Date: 06/20/2018   ID: [name], [name] May 06, 1945, MRN [id number]  PCP: Manon Hilding, MD  Primary Cardiologist: Rozann Lesches, MD   Chief Complaint  Patient presents with  . Coronary Artery Disease    History of Present Illness: [name] is a 73 y.o. female last seen in July 2019.  She is here for a routine follow-up visit.  She tells me that she feels better than at the last encounter, somewhat more energy, stable appetite, no dizziness or chest pain.  Since last visit Lopressor was cut back to 25 mg twice daily due to bradycardia.  I reviewed her remaining cardiac medications which are stable and outlined below.  She states that she saw Dr. Quintin Alto back in December.  She remains on antiplatelet and statin therapy.  Past Medical History:  Diagnosis Date  . Acute ischemic colitis ([place]) 09/11/2005  . Anxiety   . Arthritis   . Atherosclerotic vascular disease    Calcified plaque at the origin of the celiac and SMA  . CHF (congestive heart failure) ([place])   . Coronary atherosclerosis of native coronary artery    Mulitvessel LVEF 50-50%, DES circ 1/11, occluded SVG to OM and SVG to diagonal , LVEF 60%  . Depression   . Diverticulosis of colon   . Essential hypertension, benign   . Hypothyroidism   . Mesenteric ischemia ([place])   . Mitral regurgitation    Mild to moderate  . Mixed hyperlipidemia   . Myocardial infarction ([place]) 1990  . Obesity   . Orthostatic hypotension   . PAD (peripheral artery disease) ([place])   . PVC's (premature ventricular contractions)   . Schizophrenia ([place])   . Sleep apnea    CPAP  . Tubular adenoma   . Type 2 diabetes mellitus ([place])     Past Surgical History:  Procedure Laterality Date  . BACK SURGERY     Lumbar spine surgery  . Carotid endarectomy Bilateral   . CARPAL TUNNEL RELEASE     Bilateral  . CATARACT EXTRACTION W/PHACO Right 08/24/2013   Procedure: CATARACT EXTRACTION PHACO AND INTRAOCULAR  LENS PLACEMENT (IOC);  Surgeon: Tonny Branch, MD;  Location: [facility];  Service: Ophthalmology;  Laterality: Right;  CDE 8.68  . COLONOSCOPY  09/14/2005   Dr. Leonard Schwartz colitis  . COLONOSCOPY WITH ESOPHAGOGASTRODUODENOSCOPY (EGD)  04/14/2012   Dr. Gala Romney- EGD= gastric erosions of doubtful clinical significance per bx- chronic inactive gastritis, benign small bowel mucosa. TCS=colonic diverticulosis, tubular adenoma  . CORONARY ARTERY BYPASS GRAFT     LIMA-LAD; SVG-OM; SVG-DX in 1995 [place]  . NECK SURGERY     Cervical laminectomy  . PARTIAL HYSTERECTOMY    . stents x4      Current Outpatient Medications  Medication Sig Dispense Refill  . amLODipine (NORVASC) 5 MG tablet Take 5 mg by mouth daily.    . busPIRone (BUSPAR) 15 MG tablet 15 mg 3 (three) times daily.     . clopidogrel (PLAVIX) 75 MG tablet Take 75 mg by mouth daily.      . hydrOXYzine (ATARAX/VISTARIL) 25 MG tablet Take 25 mg by mouth daily as needed.    . isosorbide mononitrate (IMDUR) 60 MG 24 hr tablet     . metFORMIN (GLUCOPHAGE) 500 MG tablet Take 500 mg by mouth 2 (two) times daily with a meal.    . metoprolol (LOPRESSOR) 50 MG tablet Take 25 mg by mouth 2 (two) times daily. Hold if hr below  50    . Multiple Vitamins-Minerals (MULTIVITAMIN ADULT PO) Take 1 tablet by mouth daily.     . nitroGLYCERIN (NITROSTAT) 0.4 MG SL tablet Place 1 tablet (0.4 mg total) under the tongue every 5 (five) minutes as needed. 90 tablet 3  . pravastatin (PRAVACHOL) 80 MG tablet Take 80 mg by mouth daily.      . Tiotropium Bromide-Olodaterol (STIOLTO RESPIMAT) 2.5-2.5 MCG/ACT AERS Inhale 2 puffs into the lungs daily. 1 Inhaler 0  . torsemide (DEMADEX) 20 MG tablet Take 40 mg by mouth as needed.     No current facility-administered medications for this visit.    Allergies:  Patient has no known allergies.   Social History: The patient  reports that she has been smoking cigarettes. She started smoking about 59 years ago. She has a 25.00  pack-year smoking history. She has never used smokeless tobacco. She reports that she does not drink alcohol or use drugs.   ROS:  Please see the history of present illness. Otherwise, complete review of systems is positive for hearing loss.  All other systems are reviewed and negative.   Physical Exam: VS:  BP (!) 149/77   Pulse (!) 54   Ht 5\' 4"  (1.626 m)   Wt 225 lb 12.8 oz (102.4 kg)   SpO2 97%   BMI 38.76 kg/m , BMI Body mass index is 38.76 kg/m.  Wt Readings from Last 3 Encounters:  06/20/18 225 lb 12.8 oz (102.4 kg)  11/14/17 202 lb 9.6 oz (91.9 kg)  11/04/17 199 lb (90.3 kg)    General: Chronically ill-appearing elderly woman, appears comfortable at rest. HEENT: Conjunctiva and lids normal, oropharynx clear. Neck: Supple, no elevated JVP or carotid bruits, no thyromegaly. Lungs: Diminished breath sounds without wheezing, nonlabored breathing at rest. Cardiac: Regular rate and rhythm, no S3 or significant systolic murmur. Abdomen: Soft, nontender, bowel sounds present. Extremities: Trace ankle edema, distal pulses 2+. Skin: Warm and dry. Musculoskeletal: No kyphosis. Neuropsychiatric: Alert and oriented x3, affect grossly appropriate.  ECG: I personally reviewed the tracing from 10/27/2017 which showed sinus rhythm with nonspecific ST-T changes and absent anteroseptal R waves.  Recent Labwork:    Component Value Date/Time   CHOL 142 08/19/2017 1505   TRIG 154 (H) 08/19/2017 1505   HDL 45 08/19/2017 1505   CHOLHDL 3.2 08/19/2017 1505   LDLCALC 66 08/19/2017 1505  June 2019: BUN 18, creatinine 0.93, potassium 3.3, AST 12, ALT 136, troponin T negative x3, hemoglobin 12.1, platelets 196  Other Studies Reviewed Today:  Carotid Dopplers 07/25/2017: Final Interpretation: Right Carotid: Velocities in the right ICA are consistent with a 40-59%                stenosis. The ECA appears >50% stenosed.  Left Carotid: Velocities in the left ICA are consistent with a 1-39%  stenosis.               The ECA appears >50% stenosed.  Vertebrals:  Bilateral vertebral arteries demonstrate antegrade flow. Subclavians: Normal flow hemodynamics were seen in bilateral subclavian              arteries.  CT angiography of the head neck June 2019 [facility]): Proximal RICA stenosis of 60% with calcified plaque, less than 81% proximal LICA stenosis with fibrofatty plaque, severe stenosis at the origin of the left vertebral artery with calcified plaque, and distal LICA stenosis of 50 to 70% with calcified plaque.  She also had an incidentally noted 4.1 cm ascending  aortic aneurysm.  Assessment and Plan:  1.  Multivessel CAD status post CABG with graft disease.  She reports no active angina symptoms on medical therapy and we will continue with observation.  She is on antiplatelet therapy and statin.  Also beta-blocker and Norvasc.  2.  Mixed hyperlipidemia on Pravachol.  She continues to follow with Dr. Quintin Alto.  3.  Mildly dilated ascending aorta at 4.1 cm, asymptomatic.  4.  Essential hypertension, continue with current medications.  Current medicines were reviewed with the patient today.  Disposition: Follow-up in 6 months.  Signed, Satira Sark, MD, [facility] 06/20/2018 4:17 PM    [place] at [place], [place], [place] [address] Phone: [phone]; Fax: [phone]

## 2018-06-28 DIAGNOSIS — G4733 Obstructive sleep apnea (adult) (pediatric): Secondary | ICD-10-CM | POA: Diagnosis not present

## 2018-07-28 DIAGNOSIS — G4733 Obstructive sleep apnea (adult) (pediatric): Secondary | ICD-10-CM | POA: Diagnosis not present

## 2018-07-28 DIAGNOSIS — R092 Respiratory arrest: Secondary | ICD-10-CM | POA: Diagnosis not present

## 2018-07-28 DIAGNOSIS — R0902 Hypoxemia: Secondary | ICD-10-CM | POA: Diagnosis not present

## 2018-08-19 DIAGNOSIS — R52 Pain, unspecified: Secondary | ICD-10-CM | POA: Diagnosis not present

## 2018-08-19 DIAGNOSIS — R5381 Other malaise: Secondary | ICD-10-CM | POA: Diagnosis not present

## 2018-08-21 DIAGNOSIS — M79605 Pain in left leg: Secondary | ICD-10-CM | POA: Diagnosis not present

## 2018-08-21 DIAGNOSIS — L03116 Cellulitis of left lower limb: Secondary | ICD-10-CM | POA: Diagnosis not present

## 2018-08-21 DIAGNOSIS — M25571 Pain in right ankle and joints of right foot: Secondary | ICD-10-CM | POA: Diagnosis not present

## 2018-08-21 DIAGNOSIS — M1712 Unilateral primary osteoarthritis, left knee: Secondary | ICD-10-CM | POA: Diagnosis not present

## 2018-08-21 DIAGNOSIS — Z79899 Other long term (current) drug therapy: Secondary | ICD-10-CM | POA: Diagnosis not present

## 2018-08-21 DIAGNOSIS — I10 Essential (primary) hypertension: Secondary | ICD-10-CM | POA: Diagnosis not present

## 2018-08-21 DIAGNOSIS — M25462 Effusion, left knee: Secondary | ICD-10-CM | POA: Diagnosis not present

## 2018-08-21 DIAGNOSIS — E119 Type 2 diabetes mellitus without complications: Secondary | ICD-10-CM | POA: Diagnosis not present

## 2018-08-21 DIAGNOSIS — M25572 Pain in left ankle and joints of left foot: Secondary | ICD-10-CM | POA: Diagnosis not present

## 2018-08-21 DIAGNOSIS — Z7984 Long term (current) use of oral hypoglycemic drugs: Secondary | ICD-10-CM | POA: Diagnosis not present

## 2018-08-21 DIAGNOSIS — M25552 Pain in left hip: Secondary | ICD-10-CM | POA: Diagnosis not present

## 2018-08-28 DIAGNOSIS — R0902 Hypoxemia: Secondary | ICD-10-CM | POA: Diagnosis not present

## 2018-08-28 DIAGNOSIS — G4733 Obstructive sleep apnea (adult) (pediatric): Secondary | ICD-10-CM | POA: Diagnosis not present

## 2018-09-01 DIAGNOSIS — R5382 Chronic fatigue, unspecified: Secondary | ICD-10-CM | POA: Diagnosis not present

## 2018-09-01 DIAGNOSIS — E1165 Type 2 diabetes mellitus with hyperglycemia: Secondary | ICD-10-CM | POA: Diagnosis not present

## 2018-09-01 DIAGNOSIS — E782 Mixed hyperlipidemia: Secondary | ICD-10-CM | POA: Diagnosis not present

## 2018-09-01 DIAGNOSIS — I10 Essential (primary) hypertension: Secondary | ICD-10-CM | POA: Diagnosis not present

## 2018-09-01 DIAGNOSIS — E162 Hypoglycemia, unspecified: Secondary | ICD-10-CM | POA: Diagnosis not present

## 2018-09-04 DIAGNOSIS — M545 Low back pain: Secondary | ICD-10-CM | POA: Diagnosis not present

## 2018-09-04 DIAGNOSIS — N182 Chronic kidney disease, stage 2 (mild): Secondary | ICD-10-CM | POA: Diagnosis not present

## 2018-09-04 DIAGNOSIS — G4709 Other insomnia: Secondary | ICD-10-CM | POA: Diagnosis not present

## 2018-09-09 IMAGING — DX DG CHEST 2V
2 series · 2 of 2 positions shown · non-contrast
Comparison: Portable chest x-ray of 08/25/2017

CLINICAL DATA: Shortness of breath, smoking history

EXAM:
CHEST - 2 VIEW

[chest pa]
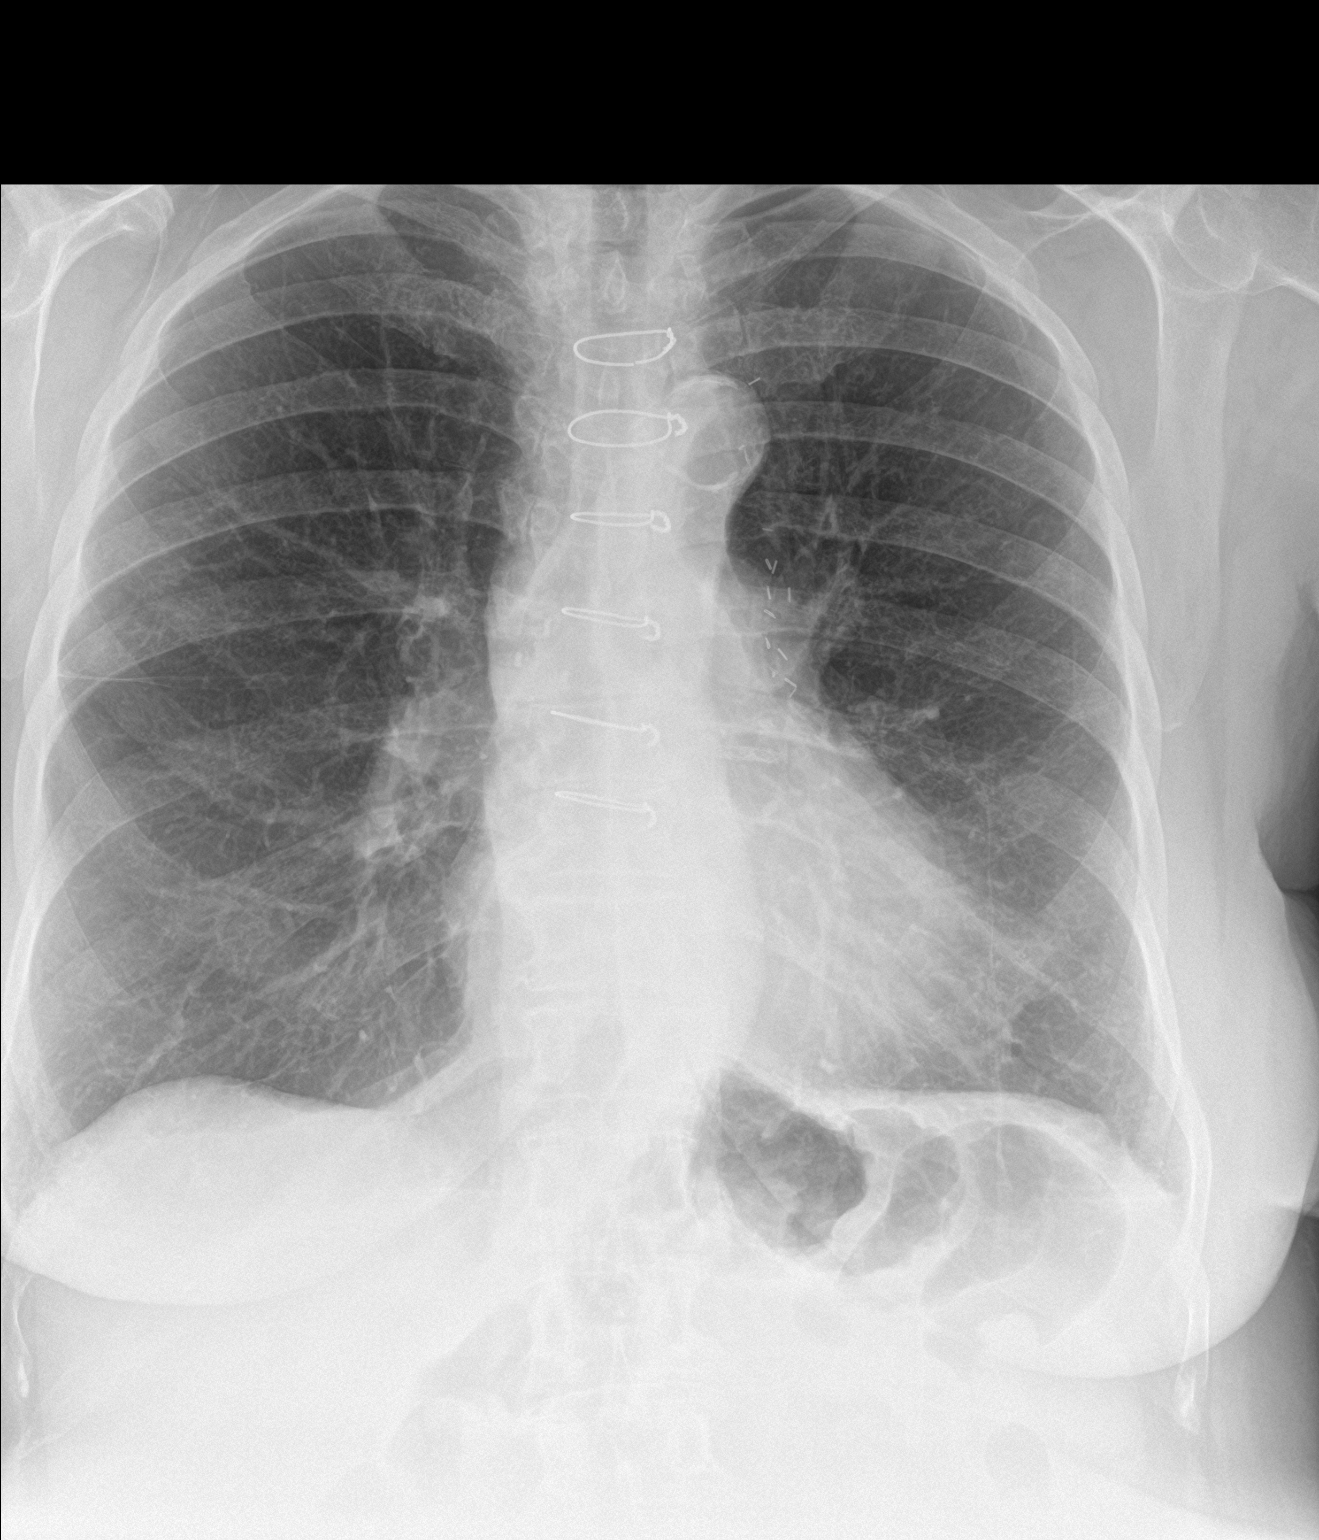

[chest lat]
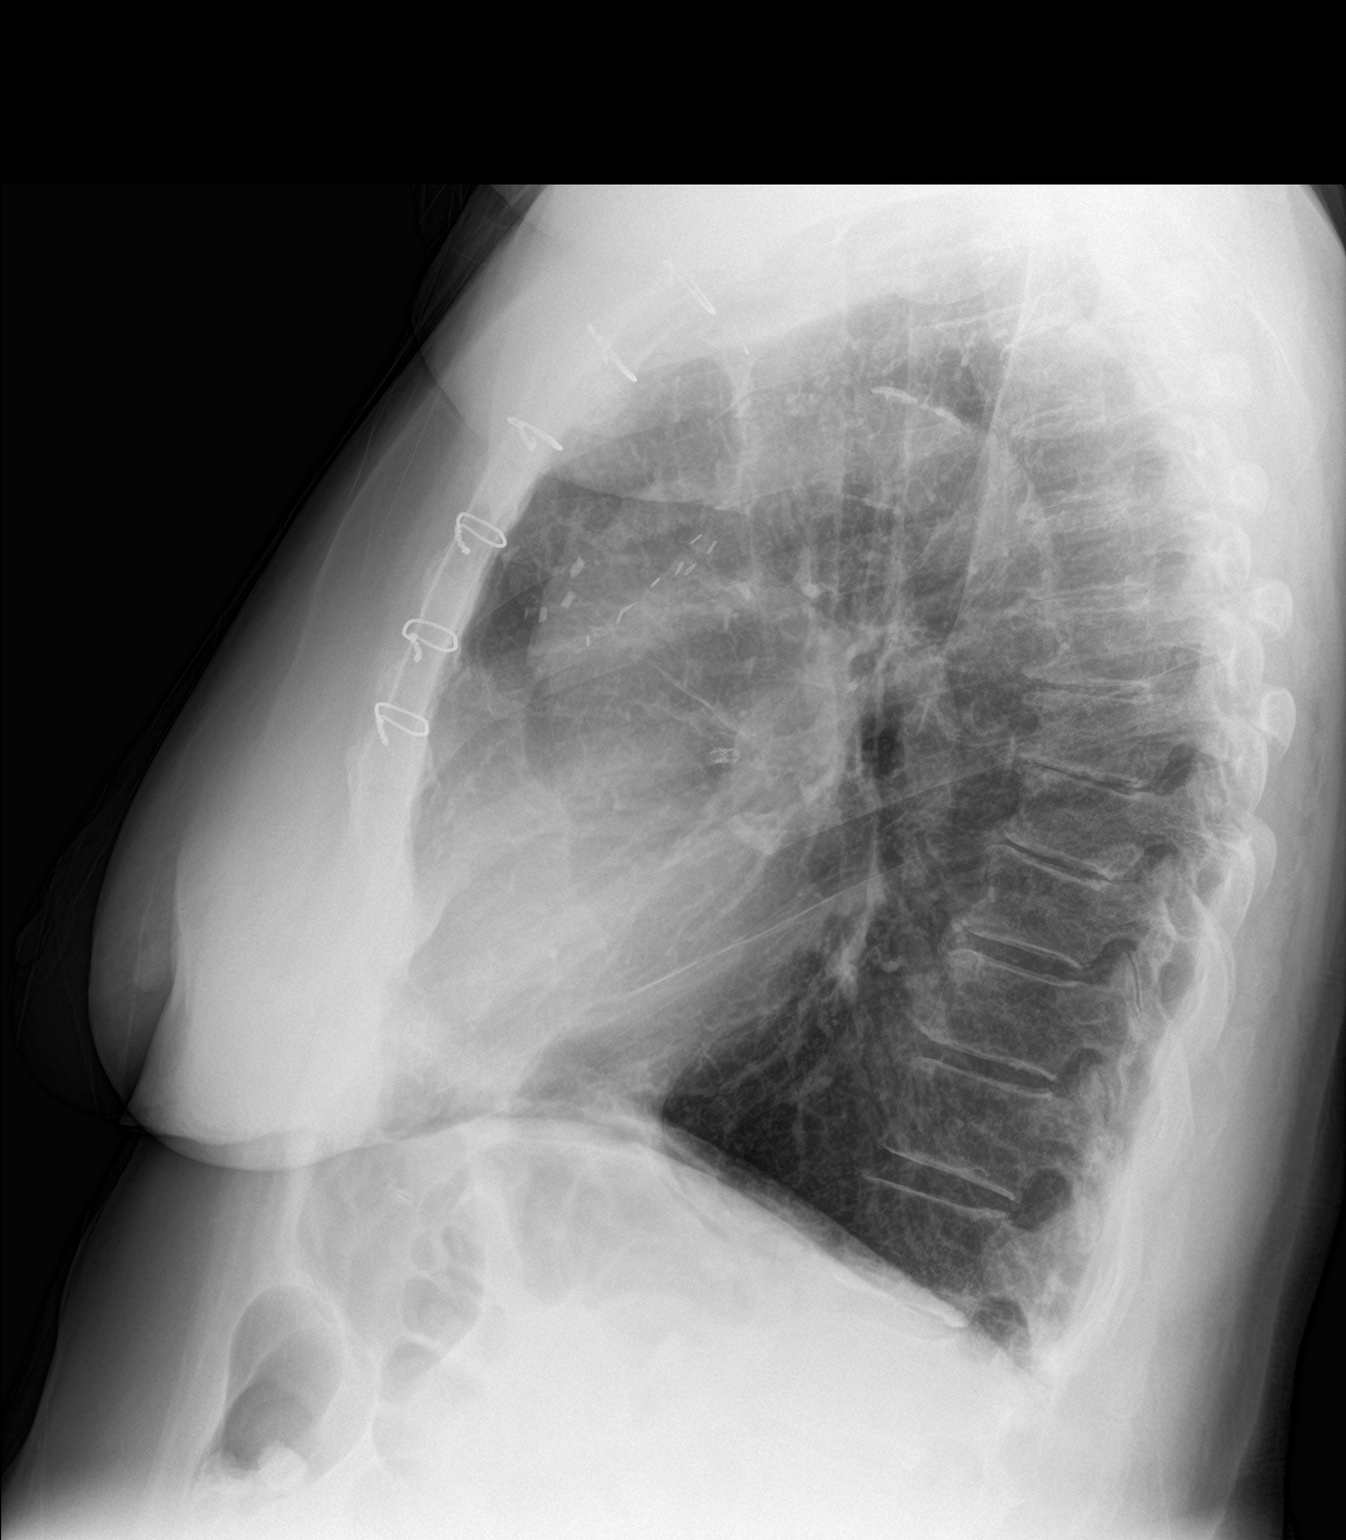

[2 of 2 positions shown; findings below may reference images not displayed]

FINDINGS: No active infiltrate or effusion is seen. The lungs are slightly
hyperaerated. Mediastinal and hilar contours are unremarkable. The
heart is within upper limits of normal. Median sternotomy sutures
are present from prior CABG and moderate thoracic aortic
atherosclerosis is noted. No acute bony abnormality is seen. Wire
cerclage of the posterior elements of the lower cervical spine is
noted.
IMPRESSION: 1. No active lung disease.  Slight hyper aeration.
2. Prior CABG.

## 2018-09-27 DIAGNOSIS — R0902 Hypoxemia: Secondary | ICD-10-CM | POA: Diagnosis not present

## 2018-09-27 DIAGNOSIS — G4733 Obstructive sleep apnea (adult) (pediatric): Secondary | ICD-10-CM | POA: Diagnosis not present

## 2018-10-01 DIAGNOSIS — M25371 Other instability, right ankle: Secondary | ICD-10-CM | POA: Diagnosis not present

## 2018-10-03 DIAGNOSIS — M79673 Pain in unspecified foot: Secondary | ICD-10-CM | POA: Diagnosis not present

## 2018-10-15 ENCOUNTER — Telehealth: Payer: Self-pay | Admitting: Cardiology

## 2018-10-15 NOTE — Telephone Encounter (Signed)

## 2018-10-16 ENCOUNTER — Ambulatory Visit (INDEPENDENT_AMBULATORY_CARE_PROVIDER_SITE_OTHER): Payer: Medicare Other

## 2018-10-16 DIAGNOSIS — I6523 Occlusion and stenosis of bilateral carotid arteries: Secondary | ICD-10-CM | POA: Diagnosis not present

## 2018-10-17 ENCOUNTER — Telehealth: Payer: Self-pay | Admitting: *Deleted

## 2018-10-17 DIAGNOSIS — I6523 Occlusion and stenosis of bilateral carotid arteries: Secondary | ICD-10-CM

## 2018-10-17 NOTE — Telephone Encounter (Signed)
-----   Message from Satira Sark, MD sent at 10/17/2018  9:59 AM EDT ----- Results reviewed.  Stable ICA disease, moderate on the right and mild on the left as before.  Continue with medical therapy and follow-up carotid Dopplers in 1 year.

## 2018-10-17 NOTE — Telephone Encounter (Signed)
Granddaughter informed. Copy sent to PCP

## 2018-10-24 DIAGNOSIS — M545 Low back pain: Secondary | ICD-10-CM | POA: Diagnosis not present

## 2018-10-28 DIAGNOSIS — G4733 Obstructive sleep apnea (adult) (pediatric): Secondary | ICD-10-CM | POA: Diagnosis not present

## 2018-10-28 DIAGNOSIS — R0902 Hypoxemia: Secondary | ICD-10-CM | POA: Diagnosis not present

## 2018-10-29 DIAGNOSIS — M4316 Spondylolisthesis, lumbar region: Secondary | ICD-10-CM | POA: Diagnosis not present

## 2018-10-29 DIAGNOSIS — M545 Low back pain: Secondary | ICD-10-CM | POA: Diagnosis not present

## 2018-10-29 DIAGNOSIS — M48061 Spinal stenosis, lumbar region without neurogenic claudication: Secondary | ICD-10-CM | POA: Diagnosis not present

## 2018-10-31 DIAGNOSIS — J209 Acute bronchitis, unspecified: Secondary | ICD-10-CM | POA: Diagnosis not present

## 2018-10-31 DIAGNOSIS — R05 Cough: Secondary | ICD-10-CM | POA: Diagnosis not present

## 2018-10-31 DIAGNOSIS — Z03818 Encounter for observation for suspected exposure to other biological agents ruled out: Secondary | ICD-10-CM | POA: Diagnosis not present

## 2018-10-31 DIAGNOSIS — Z0131 Encounter for examination of blood pressure with abnormal findings: Secondary | ICD-10-CM | POA: Diagnosis not present

## 2018-10-31 DIAGNOSIS — J019 Acute sinusitis, unspecified: Secondary | ICD-10-CM | POA: Diagnosis not present

## 2018-11-13 DIAGNOSIS — I10 Essential (primary) hypertension: Secondary | ICD-10-CM | POA: Diagnosis not present

## 2018-11-13 DIAGNOSIS — M48061 Spinal stenosis, lumbar region without neurogenic claudication: Secondary | ICD-10-CM | POA: Diagnosis not present

## 2018-11-20 ENCOUNTER — Telehealth: Payer: Self-pay | Admitting: Cardiology

## 2018-11-20 NOTE — Telephone Encounter (Signed)
Office received communication from [facility] and [facility] regarding upcoming new patient evaluation on July 29, presumably for evaluation of back pain.  Based on documentation provided, it is not entirely clear whether the patient is being considered for surgery or for spine injections.  It is however requested whether she could potentially come off of Plavix for 7 days prior to possible procedures.  [name] has a history of multivessel CAD status post CABG with documented graft disease and prior DES intervention of the circumflex in 2011.  She has been managed medically and was seen in the office in February with reasonable symptom control.  If spine injection is planned, it would be reasonable for her to hold Plavix 7 days prior to the procedure.  Satira Sark, M.D., F.A.C.C.

## 2018-11-20 NOTE — Telephone Encounter (Signed)
Routed this correspondence to [facility] fax # [phone]

## 2018-11-26 DIAGNOSIS — M5416 Radiculopathy, lumbar region: Secondary | ICD-10-CM | POA: Diagnosis not present

## 2018-11-26 DIAGNOSIS — M48061 Spinal stenosis, lumbar region without neurogenic claudication: Secondary | ICD-10-CM | POA: Diagnosis not present

## 2018-11-27 DIAGNOSIS — G4733 Obstructive sleep apnea (adult) (pediatric): Secondary | ICD-10-CM | POA: Diagnosis not present

## 2018-11-27 DIAGNOSIS — R0902 Hypoxemia: Secondary | ICD-10-CM | POA: Diagnosis not present

## 2018-11-28 DIAGNOSIS — I10 Essential (primary) hypertension: Secondary | ICD-10-CM | POA: Diagnosis not present

## 2018-11-28 DIAGNOSIS — E7800 Pure hypercholesterolemia, unspecified: Secondary | ICD-10-CM | POA: Diagnosis not present

## 2018-12-24 ENCOUNTER — Ambulatory Visit: Payer: Self-pay | Admitting: Cardiology

## 2018-12-28 DIAGNOSIS — R0902 Hypoxemia: Secondary | ICD-10-CM | POA: Diagnosis not present

## 2018-12-28 DIAGNOSIS — G4733 Obstructive sleep apnea (adult) (pediatric): Secondary | ICD-10-CM | POA: Diagnosis not present

## 2018-12-29 DIAGNOSIS — E119 Type 2 diabetes mellitus without complications: Secondary | ICD-10-CM | POA: Diagnosis not present

## 2018-12-29 DIAGNOSIS — E1165 Type 2 diabetes mellitus with hyperglycemia: Secondary | ICD-10-CM | POA: Diagnosis not present

## 2018-12-29 DIAGNOSIS — I10 Essential (primary) hypertension: Secondary | ICD-10-CM | POA: Diagnosis not present

## 2018-12-29 DIAGNOSIS — E782 Mixed hyperlipidemia: Secondary | ICD-10-CM | POA: Diagnosis not present

## 2019-01-09 DIAGNOSIS — J449 Chronic obstructive pulmonary disease, unspecified: Secondary | ICD-10-CM | POA: Diagnosis not present

## 2019-01-09 DIAGNOSIS — E162 Hypoglycemia, unspecified: Secondary | ICD-10-CM | POA: Diagnosis not present

## 2019-01-09 DIAGNOSIS — R42 Dizziness and giddiness: Secondary | ICD-10-CM | POA: Diagnosis not present

## 2019-01-09 DIAGNOSIS — E1122 Type 2 diabetes mellitus with diabetic chronic kidney disease: Secondary | ICD-10-CM | POA: Diagnosis not present

## 2019-01-09 DIAGNOSIS — Z23 Encounter for immunization: Secondary | ICD-10-CM | POA: Diagnosis not present

## 2019-01-13 DIAGNOSIS — I10 Essential (primary) hypertension: Secondary | ICD-10-CM | POA: Diagnosis not present

## 2019-01-13 DIAGNOSIS — Z7982 Long term (current) use of aspirin: Secondary | ICD-10-CM | POA: Diagnosis not present

## 2019-01-13 DIAGNOSIS — Z7984 Long term (current) use of oral hypoglycemic drugs: Secondary | ICD-10-CM | POA: Diagnosis not present

## 2019-01-13 DIAGNOSIS — J441 Chronic obstructive pulmonary disease with (acute) exacerbation: Secondary | ICD-10-CM | POA: Diagnosis not present

## 2019-01-13 DIAGNOSIS — Z888 Allergy status to other drugs, medicaments and biological substances status: Secondary | ICD-10-CM | POA: Diagnosis not present

## 2019-01-13 DIAGNOSIS — R0602 Shortness of breath: Secondary | ICD-10-CM | POA: Diagnosis not present

## 2019-01-13 DIAGNOSIS — R5383 Other fatigue: Secondary | ICD-10-CM | POA: Diagnosis not present

## 2019-01-13 DIAGNOSIS — J449 Chronic obstructive pulmonary disease, unspecified: Secondary | ICD-10-CM | POA: Diagnosis not present

## 2019-01-13 DIAGNOSIS — R42 Dizziness and giddiness: Secondary | ICD-10-CM | POA: Diagnosis not present

## 2019-01-13 DIAGNOSIS — Z7902 Long term (current) use of antithrombotics/antiplatelets: Secondary | ICD-10-CM | POA: Diagnosis not present

## 2019-01-13 DIAGNOSIS — Z9981 Dependence on supplemental oxygen: Secondary | ICD-10-CM | POA: Diagnosis not present

## 2019-01-13 DIAGNOSIS — Z79899 Other long term (current) drug therapy: Secondary | ICD-10-CM | POA: Diagnosis not present

## 2019-01-13 DIAGNOSIS — E119 Type 2 diabetes mellitus without complications: Secondary | ICD-10-CM | POA: Diagnosis not present

## 2019-01-13 DIAGNOSIS — R079 Chest pain, unspecified: Secondary | ICD-10-CM | POA: Diagnosis not present

## 2019-01-13 DIAGNOSIS — Z720 Tobacco use: Secondary | ICD-10-CM | POA: Diagnosis not present

## 2019-01-28 DIAGNOSIS — G4733 Obstructive sleep apnea (adult) (pediatric): Secondary | ICD-10-CM | POA: Diagnosis not present

## 2019-01-28 DIAGNOSIS — R0902 Hypoxemia: Secondary | ICD-10-CM | POA: Diagnosis not present

## 2019-02-06 ENCOUNTER — Ambulatory Visit: Payer: Medicare Other | Admitting: Cardiology

## 2019-02-11 DIAGNOSIS — M25551 Pain in right hip: Secondary | ICD-10-CM | POA: Diagnosis not present

## 2019-02-11 DIAGNOSIS — Z79891 Long term (current) use of opiate analgesic: Secondary | ICD-10-CM | POA: Diagnosis not present

## 2019-02-11 DIAGNOSIS — G894 Chronic pain syndrome: Secondary | ICD-10-CM | POA: Diagnosis not present

## 2019-02-11 DIAGNOSIS — M961 Postlaminectomy syndrome, not elsewhere classified: Secondary | ICD-10-CM | POA: Diagnosis not present

## 2019-02-11 DIAGNOSIS — Z79899 Other long term (current) drug therapy: Secondary | ICD-10-CM | POA: Diagnosis not present

## 2019-02-11 DIAGNOSIS — M542 Cervicalgia: Secondary | ICD-10-CM | POA: Diagnosis not present

## 2019-02-27 DIAGNOSIS — G4733 Obstructive sleep apnea (adult) (pediatric): Secondary | ICD-10-CM | POA: Diagnosis not present

## 2019-02-27 DIAGNOSIS — R0902 Hypoxemia: Secondary | ICD-10-CM | POA: Diagnosis not present

## 2019-02-27 DIAGNOSIS — I10 Essential (primary) hypertension: Secondary | ICD-10-CM | POA: Diagnosis not present

## 2019-03-18 ENCOUNTER — Encounter: Payer: Self-pay | Admitting: Cardiology

## 2019-03-18 ENCOUNTER — Ambulatory Visit (INDEPENDENT_AMBULATORY_CARE_PROVIDER_SITE_OTHER): Payer: Medicare Other | Admitting: Cardiology

## 2019-03-18 ENCOUNTER — Other Ambulatory Visit: Payer: Self-pay

## 2019-03-18 VITALS — BP 131/66 | HR 51 | Ht 64.0 in | Wt 234.4 lb

## 2019-03-18 DIAGNOSIS — R001 Bradycardia, unspecified: Secondary | ICD-10-CM

## 2019-03-18 DIAGNOSIS — E782 Mixed hyperlipidemia: Secondary | ICD-10-CM

## 2019-03-18 DIAGNOSIS — I10 Essential (primary) hypertension: Secondary | ICD-10-CM

## 2019-03-18 DIAGNOSIS — I6523 Occlusion and stenosis of bilateral carotid arteries: Secondary | ICD-10-CM | POA: Diagnosis not present

## 2019-03-18 DIAGNOSIS — I25119 Atherosclerotic heart disease of native coronary artery with unspecified angina pectoris: Secondary | ICD-10-CM | POA: Diagnosis not present

## 2019-03-18 MED ORDER — METOPROLOL TARTRATE 25 MG PO TABS: 12.5000 mg | ORAL_TABLET | Freq: Two times a day (BID) | ORAL | 3 refills | Status: DC

## 2019-03-18 NOTE — Progress Notes (Signed)
Cardiology Office Note  Date: 03/18/2019   ID: [name], DOB [birth year], MRN [id number]  PCP:  Manon Hilding, MD  Cardiologist:  Rozann Lesches, MD Electrophysiologist:  None   Chief Complaint  Patient presents with  . Cardiac follow-up    History of Present Illness: [name] is a 73 y.o. female last seen in February.  She presents for a routine visit.  Since last evaluation she does not report any progressive angina symptoms, no nitroglycerin use.  She has been on stable cardiac regimen as outlined below.  Carotid Dopplers from June of this year showed mild to moderate ICA stenosis, right greater than left as outlined below.  She is asymptomatic.  She was evaluated for back pain in the interim but decided not to have any specific interventions.  We discussed her low heart rate today.  She states that she gets similar heart rates when she checks it at home.  She has had no syncope.  We already cut back on her Lopressor dose and will continue to do so.  I personally reviewed her ECG today which shows sinus bradycardia at 48 with borderline prolonged PR interval and anteroseptal Q waves.  Past Medical History:  Diagnosis Date  . Acute ischemic colitis ([place]) 09/11/2005  . Anxiety   . Arthritis   . Atherosclerotic vascular disease    Calcified plaque at the origin of the celiac and SMA  . CHF (congestive heart failure) ([place])   . Coronary atherosclerosis of native coronary artery    Mulitvessel LVEF 50-50%, DES circ 1/11, occluded SVG to OM and SVG to diagonal , LVEF 60%  . Depression   . Diverticulosis of colon   . Essential hypertension, benign   . Hypothyroidism   . Mesenteric ischemia ([place])   . Mitral regurgitation    Mild to moderate  . Mixed hyperlipidemia   . Myocardial infarction ([place]) 1990  . Obesity   . Orthostatic hypotension   . PAD (peripheral artery disease) ([place])   . PVC's (premature ventricular contractions)   . Schizophrenia ([place])    . Sleep apnea    CPAP  . Tubular adenoma   . Type 2 diabetes mellitus ([place])     Past Surgical History:  Procedure Laterality Date  . BACK SURGERY     Lumbar spine surgery  . Carotid endarectomy Bilateral   . CARPAL TUNNEL RELEASE     Bilateral  . CATARACT EXTRACTION W/PHACO Right 08/24/2013   Procedure: CATARACT EXTRACTION PHACO AND INTRAOCULAR LENS PLACEMENT (IOC);  Surgeon: Tonny Branch, MD;  Location: [facility];  Service: Ophthalmology;  Laterality: Right;  CDE 8.68  . COLONOSCOPY  09/14/2005   Dr. Leonard Schwartz colitis  . COLONOSCOPY WITH ESOPHAGOGASTRODUODENOSCOPY (EGD)  04/14/2012   Dr. Gala Romney- EGD= gastric erosions of doubtful clinical significance per bx- chronic inactive gastritis, benign small bowel mucosa. TCS=colonic diverticulosis, tubular adenoma  . CORONARY ARTERY BYPASS GRAFT     LIMA-LAD; SVG-OM; SVG-DX in 1995 [place]  . NECK SURGERY     Cervical laminectomy  . PARTIAL HYSTERECTOMY    . stents x4      Current Outpatient Medications  Medication Sig Dispense Refill  . amLODipine (NORVASC) 5 MG tablet Take 5 mg by mouth daily.    Marland Kitchen aspirin EC 81 MG tablet Take 81 mg by mouth daily.    . clopidogrel (PLAVIX) 75 MG tablet Take 75 mg by mouth daily.      . isosorbide mononitrate (IMDUR) 60 MG 24  hr tablet Take 60 mg by mouth daily.     . Multiple Vitamins-Minerals (MULTIVITAMIN ADULT PO) Take 1 tablet by mouth daily.     . nitroGLYCERIN (NITROSTAT) 0.4 MG SL tablet Place 1 tablet (0.4 mg total) under the tongue every 5 (five) minutes as needed. 90 tablet 3  . pravastatin (PRAVACHOL) 80 MG tablet Take 80 mg by mouth daily.      . Tiotropium Bromide-Olodaterol (STIOLTO RESPIMAT) 2.5-2.5 MCG/ACT AERS Inhale 2 puffs into the lungs daily. 1 Inhaler 0  . torsemide (DEMADEX) 20 MG tablet Take 40 mg by mouth as needed.    . metoprolol tartrate (LOPRESSOR) 25 MG tablet Take 0.5 tablets (12.5 mg total) by mouth 2 (two) times daily. For 2 weeks; if heart rate stays in the 50's, stop  it 30 tablet 3   No current facility-administered medications for this visit.    Allergies:  Patient has no known allergies.   Social History: The patient  reports that she has quit smoking. Her smoking use included cigarettes. She started smoking about 2 months ago. She has a 25.00 pack-year smoking history. She has never used smokeless tobacco. She reports that she does not drink alcohol or use drugs.   ROS:  Please see the history of present illness. Otherwise, complete review of systems is positive for chronic back pain.  All other systems are reviewed and negative.   Physical Exam: VS:  BP 131/66   Pulse (!) 51   Ht 5\' 4"  (1.626 m)   Wt 234 lb 6.4 oz (106.3 kg)   SpO2 97%   BMI 40.23 kg/m , BMI Body mass index is 40.23 kg/m.  Wt Readings from Last 3 Encounters:  03/18/19 234 lb 6.4 oz (106.3 kg)  06/20/18 225 lb 12.8 oz (102.4 kg)  11/14/17 202 lb 9.6 oz (91.9 kg)    General: Elderly woman, appears comfortable at rest. HEENT: Conjunctiva and lids normal, wearing a mask. Neck: Supple, no elevated JVP or carotid bruits, no thyromegaly. Lungs: Decreased breath sounds without wheezing, nonlabored breathing at rest. Cardiac: RRR, no S3 or significant systolic murmur. Abdomen: Soft, nontender, bowel sounds present. Extremities: Trace ankle edema, distal pulses 2+. Skin: Warm and dry. Musculoskeletal: No kyphosis. Neuropsychiatric: Alert and oriented x3, affect grossly appropriate.  ECG:  An ECG dated 10/27/2017 was personally reviewed today and demonstrated:  Sinus rhythm with nonspecific ST-T changes and anteroseptal Q waves.  Recent Labwork:  June 2019: BUN 18, creatinine 0.93, potassium 3.3, AST 12, ALT 136, troponin T negative x3, hemoglobin 12.1, platelets 196  Other Studies Reviewed Today:  Carotid Dopplers 10/16/2018: Summary: Right Carotid: Velocities in the right ICA are consistent with a 40-59%                stenosis. The ECA appears >50% stenosed. Due to  increased                spectral broadening and moderate plaque formation in the proximal                ICA.  Left Carotid: Velocities in the left ICA are consistent with a 1-39% stenosis.               The ECA appears >50% stenosed.  Vertebrals:  Bilateral vertebral arteries demonstrate antegrade flow. Subclavians: Normal flow hemodynamics were seen in bilateral subclavian              arteries.  CT angiography of the head neck June 2019 [facility]  [place]): Proximal RICA stenosis of 60% with calcified plaque, less than [id number] proximal LICA stenosis with fibrofatty plaque, severe stenosis at the origin of the left vertebral artery with calcified plaque, and distal LICA stenosis of 50 to 70% with calcified plaque. She also had an incidentally noted 4.1 cm ascending aortic aneurysm.  Assessment and Plan:  1.  Multivessel CAD status post CABG with subsequently documented graft disease.  She does not report any progressive angina and we will plan to continue with medical therapy and observation at this point.  ECG reviewed and stable.  Given bradycardia with heart rates in the high 40s to low 50s we will reduce Lopressor to 12.5 mg twice daily and potentially discontinue thereafter.  2.  Mixed hyperlipidemia, continues on high-dose Pravachol.  She is due for follow-up lab work with Dr. Quintin Alto next month.  3.  Essential hypertension, systolic is in the [id number] today.  No changes made to current regimen.  She is on Norvasc in addition to Lopressor.  4.  Asymptomatic carotid artery disease, mild to moderate by follow-up Dopplers in June of this year.  Continue aspirin and statin.  Medication Adjustments/Labs and Tests Ordered: Current medicines are reviewed at length with the patient today.  Concerns regarding medicines are outlined above.   Tests Ordered: Orders Placed This Encounter  Procedures  . EKG 12-Lead    Medication Changes: Meds ordered this encounter  Medications  . metoprolol  tartrate (LOPRESSOR) 25 MG tablet    Sig: Take 0.5 tablets (12.5 mg total) by mouth 2 (two) times daily. For 2 weeks; if heart rate stays in the 50's, stop it    Dispense:  30 tablet    Refill:  3    03/18/2019 dose decrease    Disposition:  Follow up 6 months in the [place] office.  Signed, Satira Sark, MD, [facility] 03/18/2019 1:27 PM    [facility] [place], [place], [place] [address] Phone: [phone]; Fax: [phone]

## 2019-03-18 NOTE — Patient Instructions (Addendum)
Medication Instructions:    Your physician has recommended you make the following change in your medication:   Decrease metoprolol tartrate (lopressor) to 12.5 mg by mouth twice daily for 2 weeks; if your heart rate stays in the 50's on this lower dose, stop it  Continue other medications the same  Labwork:  NONE  Testing/Procedures:  NONE  Follow-Up:  Your physician recommends that you schedule a follow-up appointment in: 6 months. You will receive a reminder letter in the mail in about 4 months reminding you to call and schedule your appointment. If you don't receive this letter, please contact our office.  Any Other Special Instructions Will Be Listed Below (If Applicable).  If you need a refill on your cardiac medications before your next appointment, please call your pharmacy.

## 2019-03-30 DIAGNOSIS — R0902 Hypoxemia: Secondary | ICD-10-CM | POA: Diagnosis not present

## 2019-03-30 DIAGNOSIS — E782 Mixed hyperlipidemia: Secondary | ICD-10-CM | POA: Diagnosis not present

## 2019-03-30 DIAGNOSIS — E1165 Type 2 diabetes mellitus with hyperglycemia: Secondary | ICD-10-CM | POA: Diagnosis not present

## 2019-03-30 DIAGNOSIS — I10 Essential (primary) hypertension: Secondary | ICD-10-CM | POA: Diagnosis not present

## 2019-03-30 DIAGNOSIS — G4733 Obstructive sleep apnea (adult) (pediatric): Secondary | ICD-10-CM | POA: Diagnosis not present

## 2019-04-29 DIAGNOSIS — G4733 Obstructive sleep apnea (adult) (pediatric): Secondary | ICD-10-CM | POA: Diagnosis not present

## 2019-04-29 DIAGNOSIS — R0902 Hypoxemia: Secondary | ICD-10-CM | POA: Diagnosis not present

## 2019-05-04 ENCOUNTER — Encounter: Payer: Self-pay | Admitting: *Deleted

## 2019-05-06 DIAGNOSIS — I10 Essential (primary) hypertension: Secondary | ICD-10-CM | POA: Diagnosis not present

## 2019-05-06 DIAGNOSIS — E1165 Type 2 diabetes mellitus with hyperglycemia: Secondary | ICD-10-CM | POA: Diagnosis not present

## 2019-05-06 DIAGNOSIS — E162 Hypoglycemia, unspecified: Secondary | ICD-10-CM | POA: Diagnosis not present

## 2019-05-06 DIAGNOSIS — E782 Mixed hyperlipidemia: Secondary | ICD-10-CM | POA: Diagnosis not present

## 2019-05-06 DIAGNOSIS — R0902 Hypoxemia: Secondary | ICD-10-CM | POA: Diagnosis not present

## 2019-05-13 DIAGNOSIS — E1122 Type 2 diabetes mellitus with diabetic chronic kidney disease: Secondary | ICD-10-CM | POA: Diagnosis not present

## 2019-05-13 DIAGNOSIS — E162 Hypoglycemia, unspecified: Secondary | ICD-10-CM | POA: Diagnosis not present

## 2019-05-13 DIAGNOSIS — R42 Dizziness and giddiness: Secondary | ICD-10-CM | POA: Diagnosis not present

## 2019-05-13 DIAGNOSIS — Z0001 Encounter for general adult medical examination with abnormal findings: Secondary | ICD-10-CM | POA: Diagnosis not present

## 2019-05-13 DIAGNOSIS — Z23 Encounter for immunization: Secondary | ICD-10-CM | POA: Diagnosis not present

## 2019-05-29 DIAGNOSIS — N182 Chronic kidney disease, stage 2 (mild): Secondary | ICD-10-CM | POA: Diagnosis not present

## 2019-05-30 DIAGNOSIS — G4733 Obstructive sleep apnea (adult) (pediatric): Secondary | ICD-10-CM | POA: Diagnosis not present

## 2019-05-30 DIAGNOSIS — R0902 Hypoxemia: Secondary | ICD-10-CM | POA: Diagnosis not present

## 2019-06-04 ENCOUNTER — Telehealth: Payer: Self-pay | Admitting: Cardiology

## 2019-06-04 NOTE — Telephone Encounter (Signed)
Spoke with Joelene Millin (granddaughter) Last evening BP was 200/??  But machine was not working properly.  Her friend Herbie [place]) was going to get new batteries.  Started yesterday.  Complain of SOB and blurred vision.    Call placed to patient to get more information - lmtcb.

## 2019-06-04 NOTE — Telephone Encounter (Signed)
Pt c/o BP issue:   1. What are your last 5 BP readings? 153/83 915 am   2. Are you having any other symptoms (ex. Dizziness, headache, blurred vision, passed out)? Dizziness, bad headaches -feels like she is going to pass out.   3. What is your BP issue? States that her BP continues to stay high. Patient is home alone. With Blurred vision she is unable to see what she wrote down.  Please call Joelene Millin [phone]

## 2019-06-04 NOTE — Telephone Encounter (Signed)
Daughter called office requesting to speak back to [place].

## 2019-06-04 NOTE — Telephone Encounter (Signed)
Attempted to return call to [place] (granddaughter) - voice mail was full.

## 2019-06-04 NOTE — Telephone Encounter (Signed)
Notified Joelene Millin (granddaughter) in light of the fact that we don't have anymore information to go off of for provider to make any suggestions - Dr. Domenic Polite suggest she be evaluated in the ED at [facility].  Especially if you have gotten new batteries & BP is truly running in the 200's along with blurred vision and headache.  ED can evaluate her appropriately to rule out stroke or anything else abnormal going on.  Joelene Millin verbalized understanding.

## 2019-06-13 DIAGNOSIS — Z9049 Acquired absence of other specified parts of digestive tract: Secondary | ICD-10-CM | POA: Diagnosis not present

## 2019-06-13 DIAGNOSIS — K55059 Acute (reversible) ischemia of intestine, part and extent unspecified: Secondary | ICD-10-CM | POA: Diagnosis not present

## 2019-06-13 DIAGNOSIS — E1151 Type 2 diabetes mellitus with diabetic peripheral angiopathy without gangrene: Secondary | ICD-10-CM | POA: Diagnosis not present

## 2019-06-13 DIAGNOSIS — R6521 Severe sepsis with septic shock: Secondary | ICD-10-CM | POA: Diagnosis not present

## 2019-06-13 DIAGNOSIS — A4189 Other specified sepsis: Secondary | ICD-10-CM | POA: Diagnosis not present

## 2019-06-13 DIAGNOSIS — I517 Cardiomegaly: Secondary | ICD-10-CM | POA: Diagnosis not present

## 2019-06-13 DIAGNOSIS — K55039 Acute (reversible) ischemia of large intestine, extent unspecified: Secondary | ICD-10-CM | POA: Diagnosis not present

## 2019-06-13 DIAGNOSIS — J90 Pleural effusion, not elsewhere classified: Secondary | ICD-10-CM | POA: Diagnosis not present

## 2019-06-13 DIAGNOSIS — R52 Pain, unspecified: Secondary | ICD-10-CM | POA: Diagnosis not present

## 2019-06-13 DIAGNOSIS — G450 Vertebro-basilar artery syndrome: Secondary | ICD-10-CM | POA: Diagnosis not present

## 2019-06-13 DIAGNOSIS — E119 Type 2 diabetes mellitus without complications: Secondary | ICD-10-CM | POA: Diagnosis not present

## 2019-06-13 DIAGNOSIS — A419 Sepsis, unspecified organism: Secondary | ICD-10-CM | POA: Diagnosis not present

## 2019-06-13 DIAGNOSIS — K9132 Postprocedural complete intestinal obstruction: Secondary | ICD-10-CM | POA: Diagnosis not present

## 2019-06-13 DIAGNOSIS — I252 Old myocardial infarction: Secondary | ICD-10-CM | POA: Diagnosis not present

## 2019-06-13 DIAGNOSIS — Z7984 Long term (current) use of oral hypoglycemic drugs: Secondary | ICD-10-CM | POA: Diagnosis not present

## 2019-06-13 DIAGNOSIS — R1011 Right upper quadrant pain: Secondary | ICD-10-CM | POA: Diagnosis not present

## 2019-06-13 DIAGNOSIS — K55069 Acute infarction of intestine, part and extent unspecified: Secondary | ICD-10-CM | POA: Diagnosis not present

## 2019-06-13 DIAGNOSIS — R109 Unspecified abdominal pain: Secondary | ICD-10-CM | POA: Diagnosis not present

## 2019-06-13 DIAGNOSIS — Z79899 Other long term (current) drug therapy: Secondary | ICD-10-CM | POA: Diagnosis not present

## 2019-06-13 DIAGNOSIS — I110 Hypertensive heart disease with heart failure: Secondary | ICD-10-CM | POA: Diagnosis not present

## 2019-06-13 DIAGNOSIS — I5032 Chronic diastolic (congestive) heart failure: Secondary | ICD-10-CM | POA: Diagnosis not present

## 2019-06-13 DIAGNOSIS — R1110 Vomiting, unspecified: Secondary | ICD-10-CM | POA: Diagnosis not present

## 2019-06-13 DIAGNOSIS — I10 Essential (primary) hypertension: Secondary | ICD-10-CM | POA: Diagnosis not present

## 2019-06-13 DIAGNOSIS — G4733 Obstructive sleep apnea (adult) (pediatric): Secondary | ICD-10-CM | POA: Diagnosis not present

## 2019-06-13 DIAGNOSIS — J9601 Acute respiratory failure with hypoxia: Secondary | ICD-10-CM | POA: Diagnosis not present

## 2019-06-13 DIAGNOSIS — Z7982 Long term (current) use of aspirin: Secondary | ICD-10-CM | POA: Diagnosis not present

## 2019-06-13 DIAGNOSIS — J189 Pneumonia, unspecified organism: Secondary | ICD-10-CM | POA: Diagnosis not present

## 2019-06-13 DIAGNOSIS — Z20822 Contact with and (suspected) exposure to covid-19: Secondary | ICD-10-CM | POA: Diagnosis not present

## 2019-06-13 DIAGNOSIS — K559 Vascular disorder of intestine, unspecified: Secondary | ICD-10-CM | POA: Diagnosis not present

## 2019-06-13 DIAGNOSIS — Z888 Allergy status to other drugs, medicaments and biological substances status: Secondary | ICD-10-CM | POA: Diagnosis not present

## 2019-06-13 DIAGNOSIS — K921 Melena: Secondary | ICD-10-CM | POA: Diagnosis not present

## 2019-06-13 DIAGNOSIS — J810 Acute pulmonary edema: Secondary | ICD-10-CM | POA: Diagnosis not present

## 2019-06-13 DIAGNOSIS — R188 Other ascites: Secondary | ICD-10-CM | POA: Diagnosis not present

## 2019-06-13 DIAGNOSIS — K658 Other peritonitis: Secondary | ICD-10-CM | POA: Diagnosis not present

## 2019-06-13 DIAGNOSIS — Z4659 Encounter for fitting and adjustment of other gastrointestinal appliance and device: Secondary | ICD-10-CM | POA: Diagnosis not present

## 2019-06-13 DIAGNOSIS — R112 Nausea with vomiting, unspecified: Secondary | ICD-10-CM | POA: Diagnosis not present

## 2019-06-13 DIAGNOSIS — K55041 Focal (segmental) acute infarction of large intestine: Secondary | ICD-10-CM | POA: Diagnosis not present

## 2019-06-13 DIAGNOSIS — K55049 Acute infarction of large intestine, extent unspecified: Secondary | ICD-10-CM | POA: Diagnosis not present

## 2019-06-13 DIAGNOSIS — I340 Nonrheumatic mitral (valve) insufficiency: Secondary | ICD-10-CM | POA: Diagnosis not present

## 2019-06-13 DIAGNOSIS — I4891 Unspecified atrial fibrillation: Secondary | ICD-10-CM | POA: Diagnosis not present

## 2019-06-13 DIAGNOSIS — I700 Atherosclerosis of aorta: Secondary | ICD-10-CM | POA: Diagnosis not present

## 2019-06-13 DIAGNOSIS — E872 Acidosis: Secondary | ICD-10-CM | POA: Diagnosis not present

## 2019-06-13 DIAGNOSIS — J9600 Acute respiratory failure, unspecified whether with hypoxia or hypercapnia: Secondary | ICD-10-CM | POA: Diagnosis not present

## 2019-06-13 DIAGNOSIS — R0789 Other chest pain: Secondary | ICD-10-CM | POA: Diagnosis not present

## 2019-06-13 DIAGNOSIS — J449 Chronic obstructive pulmonary disease, unspecified: Secondary | ICD-10-CM | POA: Diagnosis not present

## 2019-06-13 DIAGNOSIS — R5381 Other malaise: Secondary | ICD-10-CM | POA: Diagnosis not present

## 2019-06-13 DIAGNOSIS — I2510 Atherosclerotic heart disease of native coronary artery without angina pectoris: Secondary | ICD-10-CM | POA: Diagnosis not present

## 2019-06-13 DIAGNOSIS — E785 Hyperlipidemia, unspecified: Secondary | ICD-10-CM | POA: Diagnosis not present

## 2019-06-13 DIAGNOSIS — D62 Acute posthemorrhagic anemia: Secondary | ICD-10-CM | POA: Diagnosis not present

## 2019-06-13 DIAGNOSIS — M545 Low back pain: Secondary | ICD-10-CM | POA: Diagnosis not present

## 2019-06-13 DIAGNOSIS — R1032 Left lower quadrant pain: Secondary | ICD-10-CM | POA: Diagnosis not present

## 2019-06-13 DIAGNOSIS — I081 Rheumatic disorders of both mitral and tricuspid valves: Secondary | ICD-10-CM | POA: Diagnosis not present

## 2019-06-13 DIAGNOSIS — R7989 Other specified abnormal findings of blood chemistry: Secondary | ICD-10-CM | POA: Diagnosis not present

## 2019-06-13 DIAGNOSIS — K558 Other vascular disorders of intestine: Secondary | ICD-10-CM | POA: Diagnosis not present

## 2019-06-13 DIAGNOSIS — Z7902 Long term (current) use of antithrombotics/antiplatelets: Secondary | ICD-10-CM | POA: Diagnosis not present

## 2019-06-13 DIAGNOSIS — Z4682 Encounter for fitting and adjustment of non-vascular catheter: Secondary | ICD-10-CM | POA: Diagnosis not present

## 2019-06-13 DIAGNOSIS — J9602 Acute respiratory failure with hypercapnia: Secondary | ICD-10-CM | POA: Diagnosis not present

## 2019-06-13 DIAGNOSIS — R6520 Severe sepsis without septic shock: Secondary | ICD-10-CM | POA: Diagnosis not present

## 2019-06-13 DIAGNOSIS — K551 Chronic vascular disorders of intestine: Secondary | ICD-10-CM | POA: Diagnosis not present

## 2019-06-13 DIAGNOSIS — I248 Other forms of acute ischemic heart disease: Secondary | ICD-10-CM | POA: Diagnosis not present

## 2019-06-13 DIAGNOSIS — Z9889 Other specified postprocedural states: Secondary | ICD-10-CM | POA: Diagnosis not present

## 2019-06-13 DIAGNOSIS — Z8249 Family history of ischemic heart disease and other diseases of the circulatory system: Secondary | ICD-10-CM | POA: Diagnosis not present

## 2019-06-13 DIAGNOSIS — S31609A Unspecified open wound of abdominal wall, unspecified quadrant with penetration into peritoneal cavity, initial encounter: Secondary | ICD-10-CM | POA: Diagnosis not present

## 2019-06-13 DIAGNOSIS — Z951 Presence of aortocoronary bypass graft: Secondary | ICD-10-CM | POA: Diagnosis not present

## 2019-06-24 DIAGNOSIS — R2689 Other abnormalities of gait and mobility: Secondary | ICD-10-CM | POA: Diagnosis not present

## 2019-06-24 DIAGNOSIS — J449 Chronic obstructive pulmonary disease, unspecified: Secondary | ICD-10-CM | POA: Diagnosis not present

## 2019-06-24 DIAGNOSIS — E119 Type 2 diabetes mellitus without complications: Secondary | ICD-10-CM | POA: Diagnosis not present

## 2019-06-24 DIAGNOSIS — Z79891 Long term (current) use of opiate analgesic: Secondary | ICD-10-CM | POA: Diagnosis not present

## 2019-06-24 DIAGNOSIS — Z7982 Long term (current) use of aspirin: Secondary | ICD-10-CM | POA: Diagnosis not present

## 2019-06-24 DIAGNOSIS — I2510 Atherosclerotic heart disease of native coronary artery without angina pectoris: Secondary | ICD-10-CM | POA: Diagnosis not present

## 2019-06-24 DIAGNOSIS — Z9049 Acquired absence of other specified parts of digestive tract: Secondary | ICD-10-CM | POA: Diagnosis not present

## 2019-06-24 DIAGNOSIS — Z7902 Long term (current) use of antithrombotics/antiplatelets: Secondary | ICD-10-CM | POA: Diagnosis not present

## 2019-06-24 DIAGNOSIS — I4891 Unspecified atrial fibrillation: Secondary | ICD-10-CM | POA: Diagnosis not present

## 2019-06-24 DIAGNOSIS — J189 Pneumonia, unspecified organism: Secondary | ICD-10-CM | POA: Diagnosis not present

## 2019-06-24 DIAGNOSIS — Z48815 Encounter for surgical aftercare following surgery on the digestive system: Secondary | ICD-10-CM | POA: Diagnosis not present

## 2019-06-24 DIAGNOSIS — K551 Chronic vascular disorders of intestine: Secondary | ICD-10-CM | POA: Diagnosis not present

## 2019-06-24 DIAGNOSIS — Z48812 Encounter for surgical aftercare following surgery on the circulatory system: Secondary | ICD-10-CM | POA: Diagnosis not present

## 2019-06-25 ENCOUNTER — Telehealth: Payer: Self-pay | Admitting: Cardiology

## 2019-06-25 DIAGNOSIS — Z48815 Encounter for surgical aftercare following surgery on the digestive system: Secondary | ICD-10-CM | POA: Diagnosis not present

## 2019-06-25 DIAGNOSIS — Z7982 Long term (current) use of aspirin: Secondary | ICD-10-CM | POA: Diagnosis not present

## 2019-06-25 DIAGNOSIS — E119 Type 2 diabetes mellitus without complications: Secondary | ICD-10-CM | POA: Diagnosis not present

## 2019-06-25 DIAGNOSIS — I2510 Atherosclerotic heart disease of native coronary artery without angina pectoris: Secondary | ICD-10-CM | POA: Diagnosis not present

## 2019-06-25 DIAGNOSIS — Z79891 Long term (current) use of opiate analgesic: Secondary | ICD-10-CM | POA: Diagnosis not present

## 2019-06-25 DIAGNOSIS — J449 Chronic obstructive pulmonary disease, unspecified: Secondary | ICD-10-CM | POA: Diagnosis not present

## 2019-06-25 DIAGNOSIS — I4891 Unspecified atrial fibrillation: Secondary | ICD-10-CM | POA: Diagnosis not present

## 2019-06-25 DIAGNOSIS — Z7902 Long term (current) use of antithrombotics/antiplatelets: Secondary | ICD-10-CM | POA: Diagnosis not present

## 2019-06-25 DIAGNOSIS — Z48812 Encounter for surgical aftercare following surgery on the circulatory system: Secondary | ICD-10-CM | POA: Diagnosis not present

## 2019-06-25 DIAGNOSIS — Z9049 Acquired absence of other specified parts of digestive tract: Secondary | ICD-10-CM | POA: Diagnosis not present

## 2019-06-25 DIAGNOSIS — K551 Chronic vascular disorders of intestine: Secondary | ICD-10-CM | POA: Diagnosis not present

## 2019-06-25 MED ORDER — METOPROLOL TARTRATE 25 MG PO TABS
25.00 | ORAL_TABLET | ORAL | Status: DC
Start: 2019-06-23 — End: 2019-06-25

## 2019-06-25 MED ORDER — ONDANSETRON HCL 4 MG/2ML IJ SOLN
4.00 | INTRAMUSCULAR | Status: DC
Start: ? — End: 2019-06-25

## 2019-06-25 MED ORDER — ASPIRIN 81 MG PO TBEC
81.00 | DELAYED_RELEASE_TABLET | ORAL | Status: DC
Start: 2019-06-24 — End: 2019-06-25

## 2019-06-25 MED ORDER — BUPROPION HCL 75 MG PO TABS
150.00 | ORAL_TABLET | ORAL | Status: DC
Start: 2019-06-23 — End: 2019-06-25

## 2019-06-25 MED ORDER — AMIODARONE HCL 200 MG PO TABS
400.00 | ORAL_TABLET | ORAL | Status: DC
Start: 2019-06-23 — End: 2019-06-25

## 2019-06-25 MED ORDER — ACETAMINOPHEN 325 MG PO TABS
325.00 | ORAL_TABLET | ORAL | Status: DC
Start: 2019-06-23 — End: 2019-06-25

## 2019-06-25 MED ORDER — TRAMADOL HCL 50 MG PO TABS
50.00 | ORAL_TABLET | ORAL | Status: DC
Start: ? — End: 2019-06-25

## 2019-06-25 MED ORDER — PANTOPRAZOLE SODIUM 40 MG PO TBEC
40.00 | DELAYED_RELEASE_TABLET | ORAL | Status: DC
Start: 2019-06-24 — End: 2019-06-25

## 2019-06-25 MED ORDER — ENOXAPARIN SODIUM 40 MG/0.4ML ~~LOC~~ SOLN
40.00 | SUBCUTANEOUS | Status: DC
Start: 2019-06-24 — End: 2019-06-25

## 2019-06-25 MED ORDER — PRAVASTATIN SODIUM 40 MG PO TABS
80.00 | ORAL_TABLET | ORAL | Status: DC
Start: 2019-06-23 — End: 2019-06-25

## 2019-06-25 MED ORDER — AMIODARONE HCL 200 MG PO TABS
200.00 | ORAL_TABLET | ORAL | Status: DC
Start: ? — End: 2019-06-25

## 2019-06-25 MED ORDER — PHENOL 1.4 % MT LIQD
1.00 | OROMUCOSAL | Status: DC
Start: ? — End: 2019-06-25

## 2019-06-25 MED ORDER — CLOPIDOGREL BISULFATE 75 MG PO TABS
75.00 | ORAL_TABLET | ORAL | Status: DC
Start: 2019-06-24 — End: 2019-06-25

## 2019-06-25 MED ORDER — OXYCODONE-ACETAMINOPHEN 5-325 MG PO TABS
1.00 | ORAL_TABLET | ORAL | Status: DC
Start: ? — End: 2019-06-25

## 2019-06-25 MED ORDER — NYSTATIN 100000 UNIT/GM EX POWD
CUTANEOUS | Status: DC
Start: 2019-06-23 — End: 2019-06-25

## 2019-06-25 NOTE — Telephone Encounter (Signed)
Recently discharged from hospital due to having colon surgery.  They made some med changes that they would like Dr Domenic Polite to advise on.

## 2019-06-25 NOTE — Telephone Encounter (Signed)
Pt was at [facility] for colon surgery due to blockage - was started on amiodarone 400 mg bid for 3 days then 200 mg daily - did not change metoprolol 12.5 mg bid - Protonix 40 mg daily - and held amlodipine until f/u with pcp or cardiologist - stopped torsemide - says she was in afib (some notes in care everywhere) denies any cardiac symptoms since discharge - wanted Dr Domenic Polite to know about recent med changes and is scheduled for f/u 3/18 virtual but wanted to know if she should come into office for this f/u

## 2019-06-25 NOTE — Telephone Encounter (Signed)
Pt daughter voiced understanding - is having a hard time healing from stomach and leg incisions and doesn't know if she will be able to come into office - we will keep virtual appt for now and if arrangements need to be made to do EKG from car pt daughter will let us know - daughter will continue to monitor BP/HR from home and keep a log for upcoming appt

## 2019-06-25 NOTE — Telephone Encounter (Signed)
   By review of Care Everywhere and the Discharge Summary she underwent TEE/DCCV on 06/16/2019 and was started on PO Amiodarone. Direct quotes from the chart include "Holding NOAC due to recent GIB and will plan to hold at discharge due to the need for ASA/Plavix given her mesenteric ischemia. Patient does not have history of stroke and feel that her risk of bleeding on NOAC with ASA and Plavix exceed her risk of CVA".   Given her DCCV and now on antiarrhythmic therapy, she will need a repeat EKG at the time of her visit so will defer to Dr. Domenic Polite if he wants her to have an in-person visit or come for a nurse visit for an EKG prior to her appointment. Hopefully Amiodarone will be for a short-period of time given her atrial fibrillation occurred in the setting of her acute illness. Would encourage her to follow HR and BP and have a list of readings available for review of the time of her visit.   Signed, Erma Heritage, PA-C 06/25/2019, 10:45 AM Pager: [phone]

## 2019-06-26 ENCOUNTER — Other Ambulatory Visit: Payer: Self-pay | Admitting: *Deleted

## 2019-06-26 DIAGNOSIS — J449 Chronic obstructive pulmonary disease, unspecified: Secondary | ICD-10-CM | POA: Diagnosis not present

## 2019-06-26 DIAGNOSIS — I4891 Unspecified atrial fibrillation: Secondary | ICD-10-CM | POA: Diagnosis not present

## 2019-06-26 DIAGNOSIS — Z48812 Encounter for surgical aftercare following surgery on the circulatory system: Secondary | ICD-10-CM | POA: Diagnosis not present

## 2019-06-26 DIAGNOSIS — E1122 Type 2 diabetes mellitus with diabetic chronic kidney disease: Secondary | ICD-10-CM | POA: Diagnosis not present

## 2019-06-26 DIAGNOSIS — Z79891 Long term (current) use of opiate analgesic: Secondary | ICD-10-CM | POA: Diagnosis not present

## 2019-06-26 DIAGNOSIS — Z48815 Encounter for surgical aftercare following surgery on the digestive system: Secondary | ICD-10-CM | POA: Diagnosis not present

## 2019-06-26 DIAGNOSIS — I129 Hypertensive chronic kidney disease with stage 1 through stage 4 chronic kidney disease, or unspecified chronic kidney disease: Secondary | ICD-10-CM | POA: Diagnosis not present

## 2019-06-26 DIAGNOSIS — E7849 Other hyperlipidemia: Secondary | ICD-10-CM | POA: Diagnosis not present

## 2019-06-26 DIAGNOSIS — I2510 Atherosclerotic heart disease of native coronary artery without angina pectoris: Secondary | ICD-10-CM | POA: Diagnosis not present

## 2019-06-26 DIAGNOSIS — N182 Chronic kidney disease, stage 2 (mild): Secondary | ICD-10-CM | POA: Diagnosis not present

## 2019-06-26 DIAGNOSIS — Z7982 Long term (current) use of aspirin: Secondary | ICD-10-CM | POA: Diagnosis not present

## 2019-06-26 DIAGNOSIS — K551 Chronic vascular disorders of intestine: Secondary | ICD-10-CM | POA: Diagnosis not present

## 2019-06-26 DIAGNOSIS — Z7902 Long term (current) use of antithrombotics/antiplatelets: Secondary | ICD-10-CM | POA: Diagnosis not present

## 2019-06-26 DIAGNOSIS — Z9049 Acquired absence of other specified parts of digestive tract: Secondary | ICD-10-CM | POA: Diagnosis not present

## 2019-06-26 DIAGNOSIS — E119 Type 2 diabetes mellitus without complications: Secondary | ICD-10-CM | POA: Diagnosis not present

## 2019-06-26 MED ORDER — TORSEMIDE 20 MG PO TABS: 40.0000 mg | ORAL_TABLET | ORAL | 0 refills | Status: DC | PRN

## 2019-06-26 NOTE — Telephone Encounter (Signed)
Rutherford Nail called wanting to speak w/ Dr. Domenic Polite about getting lasix's for the pt to take, when she was d/c from [facility] pt had gained 40 lbs of fluid.

## 2019-06-26 NOTE — Telephone Encounter (Signed)
Call returned to Rutherford Nail (grand-daughter) - stated that she spoke with Dr. Quintin Alto office earlier & was told okay for her to start taking the Furosemide again.  She is requesting that we call in rx for this as it has been a while since she was on this - informed her that we can not fill as we do not show her as being on this medication in our chart.  We show Torsemide 20mg  - taking 2 tabs as needed.  Will send small supply of this to [facility] since we are coming up on the weekend.  Also, moved up the VV from 07/16/19 to 3/5/201 with provider.  Informed her that if she truly had gained this since coming home, that she should be taking her back to hospital for evaluation.  Grand-daughter states that she was discharged from [facility] this way & they still let her come home.  Strongly suggested that if symptoms worsen in the mean time to not to hesitate to take her.  She verbalized understanding.

## 2019-06-28 DIAGNOSIS — R0902 Hypoxemia: Secondary | ICD-10-CM | POA: Diagnosis not present

## 2019-06-28 DIAGNOSIS — G4733 Obstructive sleep apnea (adult) (pediatric): Secondary | ICD-10-CM | POA: Diagnosis not present

## 2019-06-29 DIAGNOSIS — J449 Chronic obstructive pulmonary disease, unspecified: Secondary | ICD-10-CM | POA: Diagnosis not present

## 2019-06-29 DIAGNOSIS — Z79891 Long term (current) use of opiate analgesic: Secondary | ICD-10-CM | POA: Diagnosis not present

## 2019-06-29 DIAGNOSIS — Z7902 Long term (current) use of antithrombotics/antiplatelets: Secondary | ICD-10-CM | POA: Diagnosis not present

## 2019-06-29 DIAGNOSIS — Z48815 Encounter for surgical aftercare following surgery on the digestive system: Secondary | ICD-10-CM | POA: Diagnosis not present

## 2019-06-29 DIAGNOSIS — I2510 Atherosclerotic heart disease of native coronary artery without angina pectoris: Secondary | ICD-10-CM | POA: Diagnosis not present

## 2019-06-29 DIAGNOSIS — I4891 Unspecified atrial fibrillation: Secondary | ICD-10-CM | POA: Diagnosis not present

## 2019-06-29 DIAGNOSIS — E119 Type 2 diabetes mellitus without complications: Secondary | ICD-10-CM | POA: Diagnosis not present

## 2019-06-29 DIAGNOSIS — Z7982 Long term (current) use of aspirin: Secondary | ICD-10-CM | POA: Diagnosis not present

## 2019-06-29 DIAGNOSIS — Z9049 Acquired absence of other specified parts of digestive tract: Secondary | ICD-10-CM | POA: Diagnosis not present

## 2019-06-29 DIAGNOSIS — K551 Chronic vascular disorders of intestine: Secondary | ICD-10-CM | POA: Diagnosis not present

## 2019-06-29 DIAGNOSIS — Z48812 Encounter for surgical aftercare following surgery on the circulatory system: Secondary | ICD-10-CM | POA: Diagnosis not present

## 2019-06-30 DIAGNOSIS — Z20822 Contact with and (suspected) exposure to covid-19: Secondary | ICD-10-CM | POA: Diagnosis not present

## 2019-06-30 DIAGNOSIS — Z95820 Peripheral vascular angioplasty status with implants and grafts: Secondary | ICD-10-CM | POA: Diagnosis not present

## 2019-06-30 DIAGNOSIS — T8149XA Infection following a procedure, other surgical site, initial encounter: Secondary | ICD-10-CM | POA: Insufficient documentation

## 2019-06-30 DIAGNOSIS — Z7982 Long term (current) use of aspirin: Secondary | ICD-10-CM | POA: Diagnosis not present

## 2019-06-30 DIAGNOSIS — I252 Old myocardial infarction: Secondary | ICD-10-CM | POA: Diagnosis not present

## 2019-06-30 DIAGNOSIS — D649 Anemia, unspecified: Secondary | ICD-10-CM | POA: Diagnosis not present

## 2019-06-30 DIAGNOSIS — Z951 Presence of aortocoronary bypass graft: Secondary | ICD-10-CM | POA: Diagnosis not present

## 2019-06-30 DIAGNOSIS — K219 Gastro-esophageal reflux disease without esophagitis: Secondary | ICD-10-CM | POA: Diagnosis not present

## 2019-06-30 DIAGNOSIS — K551 Chronic vascular disorders of intestine: Secondary | ICD-10-CM | POA: Diagnosis not present

## 2019-06-30 DIAGNOSIS — Z9889 Other specified postprocedural states: Secondary | ICD-10-CM | POA: Diagnosis not present

## 2019-06-30 DIAGNOSIS — Z7902 Long term (current) use of antithrombotics/antiplatelets: Secondary | ICD-10-CM | POA: Diagnosis not present

## 2019-06-30 DIAGNOSIS — E119 Type 2 diabetes mellitus without complications: Secondary | ICD-10-CM | POA: Diagnosis not present

## 2019-06-30 DIAGNOSIS — T8141XA Infection following a procedure, superficial incisional surgical site, initial encounter: Secondary | ICD-10-CM | POA: Diagnosis not present

## 2019-06-30 DIAGNOSIS — R062 Wheezing: Secondary | ICD-10-CM | POA: Diagnosis not present

## 2019-06-30 DIAGNOSIS — Z7951 Long term (current) use of inhaled steroids: Secondary | ICD-10-CM | POA: Diagnosis not present

## 2019-06-30 DIAGNOSIS — E8770 Fluid overload, unspecified: Secondary | ICD-10-CM | POA: Diagnosis not present

## 2019-06-30 DIAGNOSIS — L03818 Cellulitis of other sites: Secondary | ICD-10-CM | POA: Diagnosis not present

## 2019-06-30 DIAGNOSIS — Z87891 Personal history of nicotine dependence: Secondary | ICD-10-CM | POA: Diagnosis not present

## 2019-06-30 DIAGNOSIS — J449 Chronic obstructive pulmonary disease, unspecified: Secondary | ICD-10-CM | POA: Diagnosis not present

## 2019-06-30 DIAGNOSIS — I10 Essential (primary) hypertension: Secondary | ICD-10-CM | POA: Diagnosis not present

## 2019-06-30 DIAGNOSIS — I2510 Atherosclerotic heart disease of native coronary artery without angina pectoris: Secondary | ICD-10-CM | POA: Diagnosis not present

## 2019-07-01 DIAGNOSIS — Z7951 Long term (current) use of inhaled steroids: Secondary | ICD-10-CM | POA: Diagnosis not present

## 2019-07-01 DIAGNOSIS — T8149XA Infection following a procedure, other surgical site, initial encounter: Secondary | ICD-10-CM | POA: Diagnosis not present

## 2019-07-01 DIAGNOSIS — T8131XA Disruption of external operation (surgical) wound, not elsewhere classified, initial encounter: Secondary | ICD-10-CM | POA: Diagnosis not present

## 2019-07-01 DIAGNOSIS — E8770 Fluid overload, unspecified: Secondary | ICD-10-CM | POA: Diagnosis not present

## 2019-07-01 DIAGNOSIS — T8141XA Infection following a procedure, superficial incisional surgical site, initial encounter: Secondary | ICD-10-CM | POA: Diagnosis not present

## 2019-07-01 DIAGNOSIS — Z951 Presence of aortocoronary bypass graft: Secondary | ICD-10-CM | POA: Diagnosis not present

## 2019-07-01 DIAGNOSIS — E119 Type 2 diabetes mellitus without complications: Secondary | ICD-10-CM | POA: Diagnosis not present

## 2019-07-01 DIAGNOSIS — L03818 Cellulitis of other sites: Secondary | ICD-10-CM | POA: Diagnosis not present

## 2019-07-01 DIAGNOSIS — Z87891 Personal history of nicotine dependence: Secondary | ICD-10-CM | POA: Diagnosis not present

## 2019-07-01 DIAGNOSIS — J449 Chronic obstructive pulmonary disease, unspecified: Secondary | ICD-10-CM | POA: Diagnosis not present

## 2019-07-01 DIAGNOSIS — K219 Gastro-esophageal reflux disease without esophagitis: Secondary | ICD-10-CM | POA: Diagnosis not present

## 2019-07-01 DIAGNOSIS — Z20822 Contact with and (suspected) exposure to covid-19: Secondary | ICD-10-CM | POA: Diagnosis not present

## 2019-07-01 DIAGNOSIS — I252 Old myocardial infarction: Secondary | ICD-10-CM | POA: Diagnosis not present

## 2019-07-01 DIAGNOSIS — D649 Anemia, unspecified: Secondary | ICD-10-CM | POA: Diagnosis not present

## 2019-07-01 DIAGNOSIS — Z7982 Long term (current) use of aspirin: Secondary | ICD-10-CM | POA: Diagnosis not present

## 2019-07-01 DIAGNOSIS — I10 Essential (primary) hypertension: Secondary | ICD-10-CM | POA: Diagnosis not present

## 2019-07-01 DIAGNOSIS — Z7902 Long term (current) use of antithrombotics/antiplatelets: Secondary | ICD-10-CM | POA: Diagnosis not present

## 2019-07-01 DIAGNOSIS — I2510 Atherosclerotic heart disease of native coronary artery without angina pectoris: Secondary | ICD-10-CM | POA: Diagnosis not present

## 2019-07-02 DIAGNOSIS — L03818 Cellulitis of other sites: Secondary | ICD-10-CM | POA: Diagnosis not present

## 2019-07-02 DIAGNOSIS — E8770 Fluid overload, unspecified: Secondary | ICD-10-CM | POA: Diagnosis not present

## 2019-07-02 DIAGNOSIS — Z7902 Long term (current) use of antithrombotics/antiplatelets: Secondary | ICD-10-CM | POA: Diagnosis not present

## 2019-07-02 DIAGNOSIS — I252 Old myocardial infarction: Secondary | ICD-10-CM | POA: Diagnosis not present

## 2019-07-02 DIAGNOSIS — Z7951 Long term (current) use of inhaled steroids: Secondary | ICD-10-CM | POA: Diagnosis not present

## 2019-07-02 DIAGNOSIS — Z7982 Long term (current) use of aspirin: Secondary | ICD-10-CM | POA: Diagnosis not present

## 2019-07-02 DIAGNOSIS — Z87891 Personal history of nicotine dependence: Secondary | ICD-10-CM | POA: Diagnosis not present

## 2019-07-02 DIAGNOSIS — E119 Type 2 diabetes mellitus without complications: Secondary | ICD-10-CM | POA: Diagnosis not present

## 2019-07-02 DIAGNOSIS — T8149XA Infection following a procedure, other surgical site, initial encounter: Secondary | ICD-10-CM | POA: Diagnosis not present

## 2019-07-02 DIAGNOSIS — K219 Gastro-esophageal reflux disease without esophagitis: Secondary | ICD-10-CM | POA: Diagnosis not present

## 2019-07-02 DIAGNOSIS — Z20822 Contact with and (suspected) exposure to covid-19: Secondary | ICD-10-CM | POA: Diagnosis not present

## 2019-07-02 DIAGNOSIS — I2510 Atherosclerotic heart disease of native coronary artery without angina pectoris: Secondary | ICD-10-CM | POA: Diagnosis not present

## 2019-07-02 DIAGNOSIS — T8141XA Infection following a procedure, superficial incisional surgical site, initial encounter: Secondary | ICD-10-CM | POA: Diagnosis not present

## 2019-07-02 DIAGNOSIS — Z951 Presence of aortocoronary bypass graft: Secondary | ICD-10-CM | POA: Diagnosis not present

## 2019-07-02 DIAGNOSIS — D649 Anemia, unspecified: Secondary | ICD-10-CM | POA: Diagnosis not present

## 2019-07-02 DIAGNOSIS — J449 Chronic obstructive pulmonary disease, unspecified: Secondary | ICD-10-CM | POA: Diagnosis not present

## 2019-07-02 DIAGNOSIS — I10 Essential (primary) hypertension: Secondary | ICD-10-CM | POA: Diagnosis not present

## 2019-07-02 MED ORDER — DEXTROSE 10 % IV SOLN
125.00 | INTRAVENOUS | Status: DC
Start: ? — End: 2019-07-02

## 2019-07-02 MED ORDER — PRAVASTATIN SODIUM 40 MG PO TABS
80.00 | ORAL_TABLET | ORAL | Status: DC
Start: 2019-07-02 — End: 2019-07-02

## 2019-07-02 MED ORDER — POLYETHYLENE GLYCOL 3350 17 GM/SCOOP PO POWD
17.00 | ORAL | Status: DC
Start: 2019-07-03 — End: 2019-07-02

## 2019-07-02 MED ORDER — ALPRAZOLAM 0.5 MG PO TABS
0.50 | ORAL_TABLET | ORAL | Status: DC
Start: ? — End: 2019-07-02

## 2019-07-02 MED ORDER — AMIODARONE HCL 200 MG PO TABS
200.00 | ORAL_TABLET | ORAL | Status: DC
Start: 2019-07-03 — End: 2019-07-02

## 2019-07-02 MED ORDER — BUPROPION HCL ER (XL) 150 MG PO TB24
300.00 | ORAL_TABLET | ORAL | Status: DC
Start: 2019-07-03 — End: 2019-07-02

## 2019-07-02 MED ORDER — ASPIRIN 81 MG PO TBEC
81.00 | DELAYED_RELEASE_TABLET | ORAL | Status: DC
Start: 2019-07-03 — End: 2019-07-02

## 2019-07-02 MED ORDER — GLUCOSE 40 % PO GEL
15.00 | ORAL | Status: DC
Start: ? — End: 2019-07-02

## 2019-07-02 MED ORDER — ONDANSETRON HCL 4 MG/2ML IJ SOLN
4.00 | INTRAMUSCULAR | Status: DC
Start: ? — End: 2019-07-02

## 2019-07-02 MED ORDER — ISOSORBIDE MONONITRATE ER 60 MG PO TB24
60.00 | ORAL_TABLET | ORAL | Status: DC
Start: 2019-07-03 — End: 2019-07-02

## 2019-07-02 MED ORDER — SULFAMETHOXAZOLE-TRIMETHOPRIM 800-160 MG PO TABS
2.00 | ORAL_TABLET | ORAL | Status: DC
Start: 2019-07-02 — End: 2019-07-02

## 2019-07-02 MED ORDER — SODIUM CHLORIDE FLUSH 0.9 % IV SOLN
10.00 | INTRAVENOUS | Status: DC
Start: 2019-07-02 — End: 2019-07-02

## 2019-07-02 MED ORDER — GENERIC EXTERNAL MEDICATION
12.50 | Status: DC
Start: 2019-07-02 — End: 2019-07-02

## 2019-07-02 MED ORDER — CLOPIDOGREL BISULFATE 75 MG PO TABS
75.00 | ORAL_TABLET | ORAL | Status: DC
Start: 2019-07-03 — End: 2019-07-02

## 2019-07-02 MED ORDER — GABAPENTIN 300 MG PO CAPS
300.00 | ORAL_CAPSULE | ORAL | Status: DC
Start: 2019-07-02 — End: 2019-07-02

## 2019-07-02 MED ORDER — SENNOSIDES-DOCUSATE SODIUM 8.6-50 MG PO TABS
1.00 | ORAL_TABLET | ORAL | Status: DC
Start: ? — End: 2019-07-02

## 2019-07-02 MED ORDER — INSULIN LISPRO 100 UNIT/ML ~~LOC~~ SOLN
1.00 | SUBCUTANEOUS | Status: DC
Start: 2019-07-02 — End: 2019-07-02

## 2019-07-02 MED ORDER — ALBUTEROL SULFATE (2.5 MG/3ML) 0.083% IN NEBU
2.50 | INHALATION_SOLUTION | RESPIRATORY_TRACT | Status: DC
Start: ? — End: 2019-07-02

## 2019-07-02 MED ORDER — HEPARIN SODIUM (PORCINE) 5000 UNIT/ML IJ SOLN
5000.00 | INTRAMUSCULAR | Status: DC
Start: 2019-07-02 — End: 2019-07-02

## 2019-07-02 MED ORDER — PANTOPRAZOLE SODIUM 40 MG PO TBEC
40.00 | DELAYED_RELEASE_TABLET | ORAL | Status: DC
Start: 2019-07-03 — End: 2019-07-02

## 2019-07-02 MED ORDER — TIOTROPIUM BROMIDE MONOHYDRATE 18 MCG IN CAPS
1.00 | ORAL_CAPSULE | RESPIRATORY_TRACT | Status: DC
Start: 2019-07-03 — End: 2019-07-02

## 2019-07-02 MED ORDER — SODIUM CHLORIDE FLUSH 0.9 % IV SOLN
10.00 | INTRAVENOUS | Status: DC
Start: ? — End: 2019-07-02

## 2019-07-02 MED ORDER — OXYCODONE HCL 5 MG PO TABS
5.00 | ORAL_TABLET | ORAL | Status: DC
Start: ? — End: 2019-07-02

## 2019-07-02 NOTE — Progress Notes (Deleted)
{Choose 1 Note Type (Video or Telephone):[phone]}   The patient was identified using 2 identifiers.  Date:  07/02/2019   ID:  [name], DOB 03-30-1946, MRN [id number]  {Patient [phone]::"Home"} {Provider [phone]::"Home"}  PCP:  Misty Hilding, MD  Cardiologist:  Misty Lesches, MD Electrophysiologist:  None   Evaluation Performed:  {Choose Visit [id number] Visit"}  Chief Complaint:  ***  History of Present Illness:    [name] is a 74 y.o. female with ***  The patient {does/does not:200015} have symptoms concerning for COVID-19 infection (fever, chills, cough, or new shortness of breath).    Past Medical History:  Diagnosis Date  . Acute ischemic colitis ([place]) 09/11/2005  . Anxiety   . Arthritis   . Atherosclerotic vascular disease    Calcified plaque at the origin of the celiac and SMA  . CHF (congestive heart failure) ([place])   . Coronary atherosclerosis of native coronary artery    Mulitvessel LVEF 50-50%, DES circ 1/11, occluded SVG to OM and SVG to diagonal , LVEF 60%  . Depression   . Diverticulosis of colon   . Essential hypertension, benign   . Hypothyroidism   . Mesenteric ischemia ([place])   . Mitral regurgitation    Mild to moderate  . Mixed hyperlipidemia   . Myocardial infarction ([place]) 1990  . Obesity   . Orthostatic hypotension   . PAD (peripheral artery disease) ([place])   . PVC's (premature ventricular contractions)   . Schizophrenia ([place])   . Sleep apnea    CPAP  . Tubular adenoma   . Type 2 diabetes mellitus ([place])    Past Surgical History:  Procedure Laterality Date  . BACK SURGERY     Lumbar spine surgery  . Carotid endarectomy Bilateral   . CARPAL TUNNEL RELEASE     Bilateral  . CATARACT EXTRACTION W/PHACO Right 08/24/2013   Procedure: CATARACT EXTRACTION PHACO AND INTRAOCULAR LENS PLACEMENT (IOC);  Surgeon: Misty Branch, MD;  Location: [facility];  Service: Ophthalmology;  Laterality:  Right;  CDE 8.68  . COLONOSCOPY  09/14/2005   Dr. Leonard [name] colitis  . COLONOSCOPY WITH ESOPHAGOGASTRODUODENOSCOPY (EGD)  04/14/2012   Dr. Gala [name]- EGD= gastric erosions of doubtful clinical significance per bx- chronic inactive gastritis, benign small bowel mucosa. TCS=colonic diverticulosis, tubular adenoma  . CORONARY ARTERY BYPASS GRAFT     LIMA-LAD; SVG-OM; SVG-DX in 1995 [place]  . NECK SURGERY     Cervical laminectomy  . PARTIAL HYSTERECTOMY    . stents x4       No outpatient medications have been marked as taking for the 07/03/19 encounter (Appointment) with Misty Sark, MD.     Allergies:   Patient has no known allergies.   Social History   Tobacco Use  . Smoking status: Former Smoker    Packs/day: 0.50    Years: 50.00    Pack years: 25.00    Types: Cigarettes    Start date: 12/30/2018  . Smokeless tobacco: Never Used  Substance Use Topics  . Alcohol use: No    Alcohol/week: 0.0 standard drinks    Comment: Rare etoh 3-4 drinks per yr, hx beer heavily for 10-12 yrs, quit heavy etoh 1990  . Drug use: No     Family Hx: The patient's family history includes Alcohol abuse in her father and mother; Coronary artery disease in an other family member; Crohn's disease (age of onset: 19) in her sister; Hypertension in an other family member; Stroke in her daughter.  ROS:  Please see the history of present illness.    *** All other systems reviewed and are negative.   Prior CV studies:   The following studies were reviewed today:  ***  Labs/Other Tests and Data Reviewed:    EKG:  {EKG/Telemetry Strips Reviewed:[phone]}  Recent Labs: No results found for requested labs within last 8760 hours.   Recent Lipid Panel Lab Results  Component Value Date/Time   CHOL 142 08/19/2017 03:05 PM   TRIG 154 (H) 08/19/2017 03:05 PM   HDL 45 08/19/2017 03:05 PM   CHOLHDL 3.2 08/19/2017 03:05 PM   LDLCALC 66 08/19/2017 03:05 PM    Wt Readings from Last 3  Encounters:  03/18/19 234 lb 6.4 oz (106.3 kg)  06/20/18 225 lb 12.8 oz (102.4 kg)  11/14/17 202 lb 9.6 oz (91.9 kg)     Objective:    Vital Signs:  There were no vitals taken for this visit.   {[facility] Virtual Exam (Optional):[phone]::"VITAL SIGNS:  reviewed"}  ASSESSMENT & PLAN:    1. ***  COVID-19 Education: The signs and symptoms of COVID-19 were discussed with the patient and how to seek care for testing (follow up with PCP or arrange E-visit).  ***The importance of social distancing was discussed today.  Time:   Today, I have spent *** minutes with the patient with telehealth technology discussing the above problems.     Medication Adjustments/Labs and Tests Ordered: Current medicines are reviewed at length with the patient today.  Concerns regarding medicines are outlined above.   Tests Ordered: No orders of the defined types were placed in this encounter.   Medication Changes: No orders of the defined types were placed in this encounter.   Follow Up:  {F/U Format:[phone]} {follow up:15908}  Signed, Misty Lesches, MD  07/02/2019 3:04 PM    [facility]

## 2019-07-03 ENCOUNTER — Encounter: Payer: Self-pay | Admitting: Cardiology

## 2019-07-03 ENCOUNTER — Telehealth (INDEPENDENT_AMBULATORY_CARE_PROVIDER_SITE_OTHER): Payer: Medicare Other | Admitting: Cardiology

## 2019-07-03 DIAGNOSIS — Z9049 Acquired absence of other specified parts of digestive tract: Secondary | ICD-10-CM | POA: Diagnosis not present

## 2019-07-03 DIAGNOSIS — E782 Mixed hyperlipidemia: Secondary | ICD-10-CM

## 2019-07-03 DIAGNOSIS — K551 Chronic vascular disorders of intestine: Secondary | ICD-10-CM | POA: Diagnosis not present

## 2019-07-03 DIAGNOSIS — I9789 Other postprocedural complications and disorders of the circulatory system, not elsewhere classified: Secondary | ICD-10-CM

## 2019-07-03 DIAGNOSIS — I10 Essential (primary) hypertension: Secondary | ICD-10-CM | POA: Diagnosis not present

## 2019-07-03 DIAGNOSIS — I25119 Atherosclerotic heart disease of native coronary artery with unspecified angina pectoris: Secondary | ICD-10-CM

## 2019-07-03 DIAGNOSIS — I2510 Atherosclerotic heart disease of native coronary artery without angina pectoris: Secondary | ICD-10-CM | POA: Diagnosis not present

## 2019-07-03 DIAGNOSIS — Z7902 Long term (current) use of antithrombotics/antiplatelets: Secondary | ICD-10-CM | POA: Diagnosis not present

## 2019-07-03 DIAGNOSIS — I4891 Unspecified atrial fibrillation: Secondary | ICD-10-CM

## 2019-07-03 DIAGNOSIS — Z79891 Long term (current) use of opiate analgesic: Secondary | ICD-10-CM | POA: Diagnosis not present

## 2019-07-03 DIAGNOSIS — J449 Chronic obstructive pulmonary disease, unspecified: Secondary | ICD-10-CM | POA: Diagnosis not present

## 2019-07-03 DIAGNOSIS — E119 Type 2 diabetes mellitus without complications: Secondary | ICD-10-CM | POA: Diagnosis not present

## 2019-07-03 DIAGNOSIS — Z7982 Long term (current) use of aspirin: Secondary | ICD-10-CM | POA: Diagnosis not present

## 2019-07-03 DIAGNOSIS — Z48812 Encounter for surgical aftercare following surgery on the circulatory system: Secondary | ICD-10-CM | POA: Diagnosis not present

## 2019-07-03 DIAGNOSIS — Z48815 Encounter for surgical aftercare following surgery on the digestive system: Secondary | ICD-10-CM | POA: Diagnosis not present

## 2019-07-03 NOTE — Progress Notes (Signed)
Virtual Visit via Telephone Note   This visit type was conducted due to national recommendations for restrictions regarding the COVID-19 Pandemic (e.g. social distancing) in an effort to limit this patient's exposure and mitigate transmission in our community.  Due to her co-morbid illnesses, this patient is at least at moderate risk for complications without adequate follow up.  This format is felt to be most appropriate for this patient at this time.  The patient did not have access to video technology/had technical difficulties with video requiring transitioning to audio format only (telephone).  All issues noted in this document were discussed and addressed.  No physical exam could be performed with this format.  Please refer to the patient's chart for her  consent to telehealth for [facility].   The patient was identified using 2 identifiers.  Date:  07/03/2019   ID:  [name], DOB [birth year], MRN [id number]  Patient Location: Home Provider Location: Home  PCP:  Manon Hilding, MD  Cardiologist:  Rozann Lesches, MD Electrophysiologist:  None   Evaluation Performed:  Follow-Up Visit  Chief Complaint:  Cardiac follow-up  History of Present Illness:    [name] is a 74 y.o. female last seen in November 2020.  We spoke by phone today, also communicated with other family members on speaker phone.  Interval records reviewed.  She was managed at [facility] in February for mesenteric ischemia.  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  She underwent exploratory laparotomy and ultimately had an SMA bypass as well as right hemicolectomy.  Postoperative course notable for rapid atrial fibrillation resulting in cardiology consultation, temporary amiodarone infusion, and a TEE cardioversion on heparin.  She also had hematochezia that resolved without intervention.  It was felt that anticoagulation was not indicated at that time given  bleeding risk and she was otherwise continued on aspirin and Plavix.  She was recently readmitted for IV antibiotics for a wound infection.  She was discharged from the hospital yesterday, is back home with home health nursing.  She states that she feels an intermittent sense of palpitations although her most recent recorded heart rate was 59.  Blood pressure has been low normal and Norvasc not resumed as yet.  I reviewed the remainder of her cardiac regimen which is outlined below.  She is still weak, has not recuperated yet.  Recent echocardiogram obtained at [facility] revealed an LVEF of 55 to 60% with mild LVH, no focal wall motion abnormalities, normal RV contraction, no major valvular abnormalities.  Past Medical History:  Diagnosis Date  . Acute ischemic colitis ([place]) 09/11/2005  . Anxiety   . Arthritis   . Atherosclerotic vascular disease    Calcified plaque at the origin of the celiac and SMA  . CHF (congestive heart failure) ([place])   . Coronary atherosclerosis of native coronary artery    Mulitvessel LVEF 50-50%, DES circ 1/11, occluded SVG to OM and SVG to diagonal , LVEF 60%  . Depression   . Diverticulosis of colon   . Essential hypertension   . Hypothyroidism   . Mesenteric ischemia ([place])   . Mitral regurgitation    Mild to moderate  . Mixed hyperlipidemia   . Myocardial infarction ([place]) 1990  . Obesity   . Orthostatic hypotension   . PAD (peripheral artery disease) ([place])   . PVC's (premature ventricular contractions)   . Schizophrenia ([place])   . Sleep apnea    CPAP  .  Tubular adenoma   . Type 2 diabetes mellitus ([place])    Past Surgical History:  Procedure Laterality Date  . BACK SURGERY     Lumbar spine surgery  . Carotid endarectomy Bilateral   . CARPAL TUNNEL RELEASE     Bilateral  . CATARACT EXTRACTION W/PHACO Right 08/24/2013   Procedure: CATARACT EXTRACTION PHACO AND INTRAOCULAR LENS PLACEMENT (IOC);  Surgeon: Tonny Branch, MD;  Location: [facility];  Service:  Ophthalmology;  Laterality: Right;  CDE 8.68  . COLONOSCOPY  09/14/2005   Dr. Leonard Schwartz colitis  . COLONOSCOPY WITH ESOPHAGOGASTRODUODENOSCOPY (EGD)  04/14/2012   Dr. Gala Romney- EGD= gastric erosions of doubtful clinical significance per bx- chronic inactive gastritis, benign small bowel mucosa. TCS=colonic diverticulosis, tubular adenoma  . CORONARY ARTERY BYPASS GRAFT     LIMA-LAD; SVG-OM; SVG-DX in 1995 [facility]  . NECK SURGERY     Cervical laminectomy  . PARTIAL HYSTERECTOMY    . stents x4       Current Meds  Medication Sig  . aspirin EC 81 MG tablet Take 81 mg by mouth daily.  Marland Kitchen buPROPion (WELLBUTRIN XL) 150 MG 24 hr tablet Take 150 mg by mouth in the morning and at bedtime.  . clopidogrel (PLAVIX) 75 MG tablet Take 75 mg by mouth daily.    Marland Kitchen gabapentin (NEURONTIN) 300 MG capsule Take 300 mg by mouth 2 (two) times daily.  . isosorbide mononitrate (IMDUR) 60 MG 24 hr tablet Take 60 mg by mouth daily.   . metoprolol tartrate (LOPRESSOR) 25 MG tablet Take 12.5 mg by mouth 2 (two) times daily.  Marland Kitchen oxyCODONE-acetaminophen (PERCOCET/ROXICET) 5-325 MG tablet Take 1 tablet by mouth every 4 (four) hours as needed for severe pain.  . pantoprazole (PROTONIX) 40 MG tablet Take 40 mg by mouth daily.  . polyethylene glycol (MIRALAX / GLYCOLAX) 17 g packet Take 17 g by mouth daily.  . pravastatin (PRAVACHOL) 80 MG tablet Take 80 mg by mouth daily.    . Tiotropium Bromide-Olodaterol (STIOLTO RESPIMAT) 2.5-2.5 MCG/ACT AERS Inhale 2 puffs into the lungs daily.  Marland Kitchen torsemide (DEMADEX) 20 MG tablet Take 2 tablets (40 mg total) by mouth as needed.  . zolpidem (AMBIEN) 10 MG tablet Take 10 mg by mouth at bedtime as needed for sleep.     Allergies:   Patient has no known allergies.   ROS:   Generalized weakness after recent acute illness and hospital stay.  Prior CV studies:   The following studies were reviewed today:  Carotid Dopplers 10/16/2018: Summary: Right Carotid: Velocities in the right ICA  are consistent with a 40-59% stenosis. The ECA appears >50% stenosed. Due to increased spectral broadening and moderate plaque formation in the proximal ICA.  Left Carotid: Velocities in the left ICA are consistent with a 1-39% stenosis. The ECA appears >50% stenosed.  Vertebrals: Bilateral vertebral arteries demonstrate antegrade flow. Subclavians: Normal flow hemodynamics were seen in bilateral subclavian arteries.  Echocardiogram 06/15/2019 [facility]): SUMMARY The left ventricular size is normal. Mild left ventricular hypertrophy LV ejection fraction = 55-60%. Left ventricular systolic function is normal. No segmental wall motion abnormalities seen in the left ventricle The right ventricle is normal in size and function. The left atrium is mildly dilated. There is no significant valvular stenosis or regurgitation. There was insufficient TR detected to calculate RV systolic pressure. There is no pericardial effusion. The inferior vena cava was not visualized during the exam. Probably no significant change in comparison with the prior study noted  Labs/Other Tests and Data  Reviewed:    EKG:  An ECG dated 03/18/2019 was personally reviewed today and demonstrated:  Sinus bradycardia with borderline prolonged PR interval and anteroseptal Q waves.  Recent Labs: Lab Results  Component Value Date/Time   CHOL 142 08/19/2017 03:05 PM   TRIG 154 (H) 08/19/2017 03:05 PM   HDL 45 08/19/2017 03:05 PM   CHOLHDL 3.2 08/19/2017 03:05 PM   LDLCALC 66 08/19/2017 03:05 PM    Wt Readings from Last 3 Encounters:  07/03/19 253 lb (114.8 kg)  03/18/19 234 lb 6.4 oz (106.3 kg)  06/20/18 225 lb 12.8 oz (102.4 kg)     Objective:    Vital Signs:  BP 127/68   Ht 5\' 4"  (1.626 m)   Wt 253 lb (114.8 kg)   BMI 43.43 kg/m    Patient spoke in full sentences, not short of breath. No audible wheezing or coughing. Speech  pattern normal.  ASSESSMENT & PLAN:    1.  Recent postoperative atrial fibrillation as outlined above.  I reviewed her records from [facility].  Recent recorded heart rate was 59 suggesting that she is in sinus rhythm.  She underwent a TEE guided cardioversion on heparin while hospitalized and was temporarily on amiodarone as well.  CHA2DS2-VASc score is 6.  She was not anticoagulated at discharge given concerns about bleeding risk.  She is on aspirin and Plavix as before for management of ischemic heart disease.  We will schedule an office visit in the next few weeks with repeat ECG and discuss possibility of considering DOAC eventually after she further recovers.  2.  Multivessel CAD status post CABG as well as DES to the circumflex.  She has known graft disease with occluded SVG to OM and SVG to diagonal.  No progressive angina symptoms reported.  Recent echocardiogram performed at [facility] reported LVEF 55 to 60% without regional wall motion abnormalities.   3.  Essential hypertension by history.  Recent blood pressures low normal.  Would not resume Norvasc as yet.  4.  Mixed hyperlipidemia, she continues on Pravachol.  Time:   Today, I have spent 10 minutes with the patient with telehealth technology discussing the above problems.     Medication Adjustments/Labs and Tests Ordered: Current medicines are reviewed at length with the patient today.  Concerns regarding medicines are outlined above.   Tests Ordered: No orders of the defined types were placed in this encounter.   Medication Changes: No orders of the defined types were placed in this encounter.   Follow Up:  In Person 2 weeks in the [place] office.  Signed, Rozann Lesches, MD  07/03/2019 11:24 AM    [place]

## 2019-07-03 NOTE — Patient Instructions (Signed)
Medication Instructions:  STOP Norvasc     *If you need a refill on your cardiac medications before your next appointment, please call your pharmacy*   Lab Work: None today If you have labs (blood work) drawn today and your tests are completely normal, you will receive your results only by: Marland Kitchen MyChart Message (if you have MyChart) OR . A paper copy in the mail If you have any lab test that is abnormal or we need to change your treatment, we will call you to review the results.   Testing/Procedures: None today   Follow-Up: At [facility], you and your health needs are our priority.  As part of our continuing mission to provide you with exceptional heart care, we have created designated Provider Care Teams.  These Care Teams include your primary Cardiologist (physician) and Advanced Practice Providers (APPs -  Physician Assistants and Nurse Practitioners) who all work together to provide you with the care you need, when you need it.  We recommend signing up for the patient portal called "MyChart".  Sign up information is provided on this After Visit Summary.  MyChart is used to connect with patients for Virtual Visits (Telemedicine).  Patients are able to view lab/test results, encounter notes, upcoming appointments, etc.  Non-urgent messages can be sent to your provider as well.   To learn more about what you can do with MyChart, go to [email or web address].    Your next appointment:   3 week(s)  The format for your next appointment:   In Person  Provider:   Rozann Lesches, MD   Other Instructions None      Thank you for choosing [facility] !

## 2019-07-06 ENCOUNTER — Telehealth: Payer: Self-pay | Admitting: Cardiology

## 2019-07-06 DIAGNOSIS — E119 Type 2 diabetes mellitus without complications: Secondary | ICD-10-CM | POA: Diagnosis not present

## 2019-07-06 DIAGNOSIS — K551 Chronic vascular disorders of intestine: Secondary | ICD-10-CM | POA: Diagnosis not present

## 2019-07-06 DIAGNOSIS — Z7982 Long term (current) use of aspirin: Secondary | ICD-10-CM | POA: Diagnosis not present

## 2019-07-06 DIAGNOSIS — I4891 Unspecified atrial fibrillation: Secondary | ICD-10-CM | POA: Diagnosis not present

## 2019-07-06 DIAGNOSIS — Z79891 Long term (current) use of opiate analgesic: Secondary | ICD-10-CM | POA: Diagnosis not present

## 2019-07-06 DIAGNOSIS — Z7902 Long term (current) use of antithrombotics/antiplatelets: Secondary | ICD-10-CM | POA: Diagnosis not present

## 2019-07-06 DIAGNOSIS — Z48812 Encounter for surgical aftercare following surgery on the circulatory system: Secondary | ICD-10-CM | POA: Diagnosis not present

## 2019-07-06 DIAGNOSIS — Z48815 Encounter for surgical aftercare following surgery on the digestive system: Secondary | ICD-10-CM | POA: Diagnosis not present

## 2019-07-06 DIAGNOSIS — J449 Chronic obstructive pulmonary disease, unspecified: Secondary | ICD-10-CM | POA: Diagnosis not present

## 2019-07-06 DIAGNOSIS — Z9049 Acquired absence of other specified parts of digestive tract: Secondary | ICD-10-CM | POA: Diagnosis not present

## 2019-07-06 DIAGNOSIS — I2510 Atherosclerotic heart disease of native coronary artery without angina pectoris: Secondary | ICD-10-CM | POA: Diagnosis not present

## 2019-07-06 NOTE — Telephone Encounter (Signed)
Per outside records she was on IV amiodarone temporarily while in the hospital, but is not on it orally.

## 2019-07-06 NOTE — Telephone Encounter (Signed)
In reviewing pt's records it does not look like pt is on amiodarone any longer, just briefly prior to having a TEE in hospital but just to clarify I will forward to Dr. Domenic Polite to be sure.

## 2019-07-06 NOTE — Telephone Encounter (Signed)
Returned call to [place], no answer. I did leave a detailed message on voicemail with pt information. Informed if they still needed clarity to please return call.

## 2019-07-06 NOTE — Telephone Encounter (Signed)
Please give Cornelius Moras at [facility] a call @ [phone] concerning the pt's Amiodarone, wants to know if she should be taking it after d/c from hospital

## 2019-07-07 DIAGNOSIS — Z79891 Long term (current) use of opiate analgesic: Secondary | ICD-10-CM | POA: Diagnosis not present

## 2019-07-07 DIAGNOSIS — Z48815 Encounter for surgical aftercare following surgery on the digestive system: Secondary | ICD-10-CM | POA: Diagnosis not present

## 2019-07-07 DIAGNOSIS — I4891 Unspecified atrial fibrillation: Secondary | ICD-10-CM | POA: Diagnosis not present

## 2019-07-07 DIAGNOSIS — J449 Chronic obstructive pulmonary disease, unspecified: Secondary | ICD-10-CM | POA: Diagnosis not present

## 2019-07-07 DIAGNOSIS — Z7982 Long term (current) use of aspirin: Secondary | ICD-10-CM | POA: Diagnosis not present

## 2019-07-07 DIAGNOSIS — K551 Chronic vascular disorders of intestine: Secondary | ICD-10-CM | POA: Diagnosis not present

## 2019-07-07 DIAGNOSIS — Z48812 Encounter for surgical aftercare following surgery on the circulatory system: Secondary | ICD-10-CM | POA: Diagnosis not present

## 2019-07-07 DIAGNOSIS — Z9049 Acquired absence of other specified parts of digestive tract: Secondary | ICD-10-CM | POA: Diagnosis not present

## 2019-07-07 DIAGNOSIS — Z7902 Long term (current) use of antithrombotics/antiplatelets: Secondary | ICD-10-CM | POA: Diagnosis not present

## 2019-07-07 DIAGNOSIS — E119 Type 2 diabetes mellitus without complications: Secondary | ICD-10-CM | POA: Diagnosis not present

## 2019-07-07 DIAGNOSIS — I2510 Atherosclerotic heart disease of native coronary artery without angina pectoris: Secondary | ICD-10-CM | POA: Diagnosis not present

## 2019-07-08 NOTE — Telephone Encounter (Signed)
Please give pt's daughter Maudie Mercury a call @ [phone] or Cornelius Moras from [place] a call @ [phone] a call to let them know what the pt should be taking for rate control for her Afib

## 2019-07-08 NOTE — Telephone Encounter (Signed)
I apologize for the confusion about this issue.  I went back through the records for a second time.  I had a telehealth encounter with the patient recently, it would appear that the medications were not completely reconciled at that time and therefore amiodarone was not on her medication list when we spoke.  When I went back through the discharge summary, I see that the patient was discharged from [facility] on oral amiodarone that was to continue at 200 mg daily with duration of therapy left to outpatient follow-up.  Since she had perioperative atrial fibrillation, I would suggest that she continue on amiodarone 200 mg daily through the end of March which would be approximately 5 weeks total duration of treatment.  We can reassess whether longer-term treatment is necessary at her office follow-up, mainly guided by whether she has recurrences or not.

## 2019-07-08 NOTE — Telephone Encounter (Signed)
Daughter wanted to make sure patient is supposed to continue taking amiodarone 200 mg daily as she has been doing. Per daughter, patient was told by pharmacy at [place] that [facility] stopped amiodarone. Advised daughter that visit on 07/03/2019 the only medications that was stopped was amlodipine. Advised to continue all other medications the same. Amiodarone 200 mg was not listed on medication list.    Per pharmacist at [place], they were told by Margaretha Glassing last week that patient is not supposed to be on amiodarone  Please advise if patient is supposed to be on amiodarone

## 2019-07-09 NOTE — Telephone Encounter (Signed)
Daughter informed and verbalized understanding of plan. Copy sent [place].

## 2019-07-11 DIAGNOSIS — Z7982 Long term (current) use of aspirin: Secondary | ICD-10-CM | POA: Diagnosis not present

## 2019-07-11 DIAGNOSIS — Z48815 Encounter for surgical aftercare following surgery on the digestive system: Secondary | ICD-10-CM | POA: Diagnosis not present

## 2019-07-11 DIAGNOSIS — Z48812 Encounter for surgical aftercare following surgery on the circulatory system: Secondary | ICD-10-CM | POA: Diagnosis not present

## 2019-07-11 DIAGNOSIS — I2510 Atherosclerotic heart disease of native coronary artery without angina pectoris: Secondary | ICD-10-CM | POA: Diagnosis not present

## 2019-07-11 DIAGNOSIS — I4891 Unspecified atrial fibrillation: Secondary | ICD-10-CM | POA: Diagnosis not present

## 2019-07-11 DIAGNOSIS — Z7902 Long term (current) use of antithrombotics/antiplatelets: Secondary | ICD-10-CM | POA: Diagnosis not present

## 2019-07-11 DIAGNOSIS — J449 Chronic obstructive pulmonary disease, unspecified: Secondary | ICD-10-CM | POA: Diagnosis not present

## 2019-07-11 DIAGNOSIS — K551 Chronic vascular disorders of intestine: Secondary | ICD-10-CM | POA: Diagnosis not present

## 2019-07-11 DIAGNOSIS — E119 Type 2 diabetes mellitus without complications: Secondary | ICD-10-CM | POA: Diagnosis not present

## 2019-07-11 DIAGNOSIS — Z9049 Acquired absence of other specified parts of digestive tract: Secondary | ICD-10-CM | POA: Diagnosis not present

## 2019-07-11 DIAGNOSIS — Z79891 Long term (current) use of opiate analgesic: Secondary | ICD-10-CM | POA: Diagnosis not present

## 2019-07-14 DIAGNOSIS — Z48815 Encounter for surgical aftercare following surgery on the digestive system: Secondary | ICD-10-CM | POA: Diagnosis not present

## 2019-07-14 DIAGNOSIS — Z9049 Acquired absence of other specified parts of digestive tract: Secondary | ICD-10-CM | POA: Diagnosis not present

## 2019-07-14 DIAGNOSIS — Z48812 Encounter for surgical aftercare following surgery on the circulatory system: Secondary | ICD-10-CM | POA: Diagnosis not present

## 2019-07-14 DIAGNOSIS — Z7902 Long term (current) use of antithrombotics/antiplatelets: Secondary | ICD-10-CM | POA: Diagnosis not present

## 2019-07-14 DIAGNOSIS — I2510 Atherosclerotic heart disease of native coronary artery without angina pectoris: Secondary | ICD-10-CM | POA: Diagnosis not present

## 2019-07-14 DIAGNOSIS — Z7982 Long term (current) use of aspirin: Secondary | ICD-10-CM | POA: Diagnosis not present

## 2019-07-14 DIAGNOSIS — J449 Chronic obstructive pulmonary disease, unspecified: Secondary | ICD-10-CM | POA: Diagnosis not present

## 2019-07-14 DIAGNOSIS — E119 Type 2 diabetes mellitus without complications: Secondary | ICD-10-CM | POA: Diagnosis not present

## 2019-07-14 DIAGNOSIS — I4891 Unspecified atrial fibrillation: Secondary | ICD-10-CM | POA: Diagnosis not present

## 2019-07-14 DIAGNOSIS — Z79891 Long term (current) use of opiate analgesic: Secondary | ICD-10-CM | POA: Diagnosis not present

## 2019-07-14 DIAGNOSIS — K551 Chronic vascular disorders of intestine: Secondary | ICD-10-CM | POA: Diagnosis not present

## 2019-07-16 ENCOUNTER — Telehealth: Payer: Medicare Other | Admitting: Cardiology

## 2019-07-21 DIAGNOSIS — Z09 Encounter for follow-up examination after completed treatment for conditions other than malignant neoplasm: Secondary | ICD-10-CM | POA: Diagnosis not present

## 2019-07-21 DIAGNOSIS — Z8719 Personal history of other diseases of the digestive system: Secondary | ICD-10-CM | POA: Diagnosis not present

## 2019-07-22 DIAGNOSIS — J449 Chronic obstructive pulmonary disease, unspecified: Secondary | ICD-10-CM | POA: Diagnosis not present

## 2019-07-22 DIAGNOSIS — K559 Vascular disorder of intestine, unspecified: Secondary | ICD-10-CM | POA: Diagnosis not present

## 2019-07-22 DIAGNOSIS — Z95820 Peripheral vascular angioplasty status with implants and grafts: Secondary | ICD-10-CM | POA: Diagnosis not present

## 2019-07-22 DIAGNOSIS — J189 Pneumonia, unspecified organism: Secondary | ICD-10-CM | POA: Diagnosis not present

## 2019-07-23 DIAGNOSIS — Z79891 Long term (current) use of opiate analgesic: Secondary | ICD-10-CM | POA: Diagnosis not present

## 2019-07-23 DIAGNOSIS — Z48815 Encounter for surgical aftercare following surgery on the digestive system: Secondary | ICD-10-CM | POA: Diagnosis not present

## 2019-07-23 DIAGNOSIS — Z48812 Encounter for surgical aftercare following surgery on the circulatory system: Secondary | ICD-10-CM | POA: Diagnosis not present

## 2019-07-23 DIAGNOSIS — I2510 Atherosclerotic heart disease of native coronary artery without angina pectoris: Secondary | ICD-10-CM | POA: Diagnosis not present

## 2019-07-23 DIAGNOSIS — Z7982 Long term (current) use of aspirin: Secondary | ICD-10-CM | POA: Diagnosis not present

## 2019-07-23 DIAGNOSIS — I4891 Unspecified atrial fibrillation: Secondary | ICD-10-CM | POA: Diagnosis not present

## 2019-07-23 DIAGNOSIS — Z9049 Acquired absence of other specified parts of digestive tract: Secondary | ICD-10-CM | POA: Diagnosis not present

## 2019-07-23 DIAGNOSIS — E119 Type 2 diabetes mellitus without complications: Secondary | ICD-10-CM | POA: Diagnosis not present

## 2019-07-23 DIAGNOSIS — Z7902 Long term (current) use of antithrombotics/antiplatelets: Secondary | ICD-10-CM | POA: Diagnosis not present

## 2019-07-23 DIAGNOSIS — K551 Chronic vascular disorders of intestine: Secondary | ICD-10-CM | POA: Diagnosis not present

## 2019-07-23 DIAGNOSIS — J449 Chronic obstructive pulmonary disease, unspecified: Secondary | ICD-10-CM | POA: Diagnosis not present

## 2019-07-24 ENCOUNTER — Encounter: Payer: Self-pay | Admitting: Cardiology

## 2019-07-24 ENCOUNTER — Ambulatory Visit (INDEPENDENT_AMBULATORY_CARE_PROVIDER_SITE_OTHER): Payer: Medicare Other | Admitting: Cardiology

## 2019-07-24 ENCOUNTER — Other Ambulatory Visit: Payer: Self-pay

## 2019-07-24 VITALS — BP 106/64 | HR 60 | Temp 97.4°F | Ht 63.0 in | Wt 229.8 lb

## 2019-07-24 DIAGNOSIS — I25119 Atherosclerotic heart disease of native coronary artery with unspecified angina pectoris: Secondary | ICD-10-CM

## 2019-07-24 DIAGNOSIS — I4891 Unspecified atrial fibrillation: Secondary | ICD-10-CM | POA: Diagnosis not present

## 2019-07-24 DIAGNOSIS — I10 Essential (primary) hypertension: Secondary | ICD-10-CM

## 2019-07-24 DIAGNOSIS — I9789 Other postprocedural complications and disorders of the circulatory system, not elsewhere classified: Secondary | ICD-10-CM

## 2019-07-24 MED ORDER — METOPROLOL TARTRATE 25 MG PO TABS: 12.5000 mg | ORAL_TABLET | Freq: Two times a day (BID) | ORAL | 3 refills | Status: DC

## 2019-07-24 NOTE — Progress Notes (Signed)
Cardiology Office Note  Date: 07/24/2019   ID: [name], [name] October 07, 1945, MRN [id number]  PCP:  Manon Hilding, MD  Cardiologist:  Rozann Lesches, MD Electrophysiologist:  None   Chief Complaint  Patient presents with  . Cardiac follow-up    History of Present Illness: [name] is a 74 y.o. female last assessed via telehealth encounter in March.  She is here today with her daughter for a follow-up visit.  She does not report any major change in status, no sense of palpitations or chest discomfort.  She states that she has been compliant with her medications.  She continues to follow with Vascular at [facility], I reviewed the recent office note from March 24.  She is status post short segment right iliac artery to superior mesenteric artery retrograde direct bypass.  Doing well, her daughter states that she has no further follow-up arranged.  Today we went over her medications and discussed stopping amiodarone which was being used for postoperative atrial fibrillation.  She will continue aspirin and Plavix along with Lopressor for now.  If she has recurring atrial fibrillation, we will need to consider switching her to DOAC plus aspirin.  Not clear that we will necessarily need an antiarrhythmic long-term, but this can be reviewed as well.  I personally reviewed her ECG today which shows sinus rhythm.  I also went over home blood pressure checks, no plan to resume Norvasc at this time given present levels.  Past Medical History:  Diagnosis Date  . Acute ischemic colitis ([place]) 09/11/2005  . Anxiety   . Arthritis   . Atherosclerotic vascular disease    Calcified plaque at the origin of the celiac and SMA  . CHF (congestive heart failure) ([place])   . Coronary atherosclerosis of native coronary artery    Mulitvessel LVEF 50-50%, DES circ 1/11, occluded SVG to OM and SVG to diagonal , LVEF 60%  . Depression   . Diverticulosis of colon   . Essential hypertension   .  Hypothyroidism   . Mesenteric ischemia ([place])   . Mitral regurgitation    Mild to moderate  . Mixed hyperlipidemia   . Myocardial infarction ([place]) 1990  . Obesity   . Orthostatic hypotension   . PAD (peripheral artery disease) ([place])   . PVC's (premature ventricular contractions)   . Schizophrenia ([place])   . Sleep apnea    CPAP  . Tubular adenoma   . Type 2 diabetes mellitus ([place])     Past Surgical History:  Procedure Laterality Date  . BACK SURGERY     Lumbar spine surgery  . Carotid endarectomy Bilateral   . CARPAL TUNNEL RELEASE     Bilateral  . CATARACT EXTRACTION W/PHACO Right 08/24/2013   Procedure: CATARACT EXTRACTION PHACO AND INTRAOCULAR LENS PLACEMENT (IOC);  Surgeon: Tonny Branch, MD;  Location: [facility];  Service: Ophthalmology;  Laterality: Right;  CDE 8.68  . COLONOSCOPY  09/14/2005   Dr. Leonard Schwartz colitis  . COLONOSCOPY WITH ESOPHAGOGASTRODUODENOSCOPY (EGD)  04/14/2012   Dr. Gala Romney- EGD= gastric erosions of doubtful clinical significance per bx- chronic inactive gastritis, benign small bowel mucosa. TCS=colonic diverticulosis, tubular adenoma  . CORONARY ARTERY BYPASS GRAFT     LIMA-LAD; SVG-OM; SVG-DX in 1995 [place]  . NECK SURGERY     Cervical laminectomy  . PARTIAL HYSTERECTOMY    . stents x4      Current Outpatient Medications  Medication Sig Dispense Refill  . amiodarone (PACERONE) 200 MG tablet Take 1  tablet by mouth daily.    Marland Kitchen aspirin EC 81 MG tablet Take 81 mg by mouth daily.    Marland Kitchen buPROPion (WELLBUTRIN XL) 150 MG 24 hr tablet Take 150 mg by mouth in the morning and at bedtime.    . clopidogrel (PLAVIX) 75 MG tablet Take 75 mg by mouth daily.      Marland Kitchen gabapentin (NEURONTIN) 300 MG capsule Take 300 mg by mouth 2 (two) times daily.    . isosorbide mononitrate (IMDUR) 60 MG 24 hr tablet Take 60 mg by mouth daily.     . metoprolol tartrate (LOPRESSOR) 25 MG tablet Take 0.5 tablets (12.5 mg total) by mouth 2 (two) times daily. 90 tablet 3  . nitroGLYCERIN  (NITROSTAT) 0.4 MG SL tablet Place 1 tablet (0.4 mg total) under the tongue every 5 (five) minutes as needed. 90 tablet 3  . oxyCODONE-acetaminophen (PERCOCET/ROXICET) 5-325 MG tablet Take 1 tablet by mouth every 4 (four) hours as needed for severe pain.    . pantoprazole (PROTONIX) 40 MG tablet Take 40 mg by mouth daily.    . polyethylene glycol (MIRALAX / GLYCOLAX) 17 g packet Take 17 g by mouth daily.    . pravastatin (PRAVACHOL) 80 MG tablet Take 80 mg by mouth daily.      Marland Kitchen SPIRIVA HANDIHALER 18 MCG inhalation capsule 1 capsule daily.    . Tiotropium Bromide-Olodaterol (STIOLTO RESPIMAT) 2.5-2.5 MCG/ACT AERS Inhale 2 puffs into the lungs daily. 1 Inhaler 0  . torsemide (DEMADEX) 20 MG tablet Take 2 tablets (40 mg total) by mouth as needed. 30 tablet 0  . zolpidem (AMBIEN) 10 MG tablet Take 10 mg by mouth at bedtime as needed for sleep.     No current facility-administered medications for this visit.   Allergies:  Patient has no known allergies.   ROS:   Hearing loss, in a wheelchair today.  No abdominal pain.  Physical Exam: VS:  BP 106/64   Pulse 60   Temp (!) 97.4 F (36.3 C)   Ht 5\' 3"  (1.6 m)   Wt 229 lb 12.8 oz (104.2 kg)   SpO2 94%   BMI 40.71 kg/m , BMI Body mass index is 40.71 kg/m.  Wt Readings from Last 3 Encounters:  07/24/19 229 lb 12.8 oz (104.2 kg)  07/03/19 253 lb (114.8 kg)  03/18/19 234 lb 6.4 oz (106.3 kg)    General: Patient in wheelchair today, appears comfortable at rest. HEENT: Conjunctiva and lids normal, wearing a mask. Neck: Supple, no elevated JVP or carotid bruits, no thyromegaly. Lungs: Clear to auscultation, nonlabored breathing at rest. Cardiac: Regular rate and rhythm, no S3, soft significant systolic murmur. Abdomen: Soft, nontender, nbowel sounds present. Extremities: No pitting edema, distal pulses 2+.  ECG:  An ECG dated 03/18/2019 was personally reviewed today and demonstrated:  Sinus bradycardia with borderline prolonged PR interval  and anteroseptal Q waves.  Recent Labwork:    Component Value Date/Time   CHOL 142 08/19/2017 1505   TRIG 154 (H) 08/19/2017 1505   HDL 45 08/19/2017 1505   CHOLHDL 3.2 08/19/2017 1505   LDLCALC 66 08/19/2017 1505  March 2021: Potassium 3.6, BUN 11, creatinine 0.88, AST 34, ALT 28, hemoglobin 8.4, platelets 311 February 2021: TSH 3.30  Other Studies Reviewed Today:  Echocardiogram 06/15/2019 [facility]): SUMMARY The left ventricular size is normal. Mild left ventricular hypertrophy LV ejection fraction = 55-60%. Left ventricular systolic function is normal. No segmental wall motion abnormalities seen in the left ventricle The right ventricle  is normal in size and function. The left atrium is mildly dilated. There is no significant valvular stenosis or regurgitation. There was insufficient TR detected to calculate RV systolic pressure. There is no pericardial effusion. The inferior vena cava was not visualized during the exam. Probably no significant change in comparison with the prior study noted  Assessment and Plan:  1.  Postoperative atrial fibrillation following surgery in February at [facility], presented with mesenteric ischemia at that time.  She has been clinically stable on amiodarone and Lopressor, is in sinus rhythm by ECG today.  We will plan to stop amiodarone at this point and continue with observation.  If she develops recurrent atrial fibrillation, will need to consider conversion to DOAC plus aspirin for stroke prophylaxis.  2.  Essential hypertension by history, blood pressures low normal range.  Do not plan to start back on Norvasc at this time.  3.  Multivessel CAD status post CABG as well as DES to the circumflex.  Known graft disease with occluded SVG to OM and SVG to diagonal.  Continue with dual antiplatelet regimen, beta-blocker, and statin.  Medication Adjustments/Labs and Tests Ordered: Current medicines are reviewed at length with the patient today.   Concerns regarding medicines are outlined above.   Tests Ordered: Orders Placed This Encounter  Procedures  . EKG 12-Lead    Medication Changes: Meds ordered this encounter  Medications  . metoprolol tartrate (LOPRESSOR) 25 MG tablet    Sig: Take 0.5 tablets (12.5 mg total) by mouth 2 (two) times daily.    Dispense:  90 tablet    Refill:  3    Disposition:  Follow up 3 months in either office.  Signed, Satira Sark, MD, [facility] 07/24/2019 4:13 PM    [facility] at [facility] [address] [place], [place] [address] Phone: [phone]; Fax: [phone]

## 2019-07-24 NOTE — Patient Instructions (Addendum)
Medication Instructions:    Your physician recommends that you continue on your current medications as directed. Please refer to the Current Medication list given to you today.  Stop amiodarone after you finish your current supply  Continue other medications the same  Labwork:  NONE  Testing/Procedures:  NONE  Follow-Up:  Your physician recommends that you schedule a follow-up appointment in: 3 months (office).  Any Other Special Instructions Will Be Listed Below (If Applicable).  If you need a refill on your cardiac medications before your next appointment, please call your pharmacy.

## 2019-07-28 DIAGNOSIS — G4733 Obstructive sleep apnea (adult) (pediatric): Secondary | ICD-10-CM | POA: Diagnosis not present

## 2019-07-28 DIAGNOSIS — R0902 Hypoxemia: Secondary | ICD-10-CM | POA: Diagnosis not present

## 2019-07-29 DIAGNOSIS — I10 Essential (primary) hypertension: Secondary | ICD-10-CM | POA: Diagnosis not present

## 2019-07-29 DIAGNOSIS — J441 Chronic obstructive pulmonary disease with (acute) exacerbation: Secondary | ICD-10-CM | POA: Diagnosis not present

## 2019-07-31 ENCOUNTER — Other Ambulatory Visit: Payer: Self-pay

## 2019-07-31 MED ORDER — METOPROLOL TARTRATE 25 MG PO TABS: 12.5000 mg | ORAL_TABLET | Freq: Two times a day (BID) | ORAL | 3 refills | Status: DC

## 2019-08-05 DIAGNOSIS — L03116 Cellulitis of left lower limb: Secondary | ICD-10-CM | POA: Diagnosis not present

## 2019-08-28 DIAGNOSIS — G4733 Obstructive sleep apnea (adult) (pediatric): Secondary | ICD-10-CM | POA: Diagnosis not present

## 2019-08-28 DIAGNOSIS — J441 Chronic obstructive pulmonary disease with (acute) exacerbation: Secondary | ICD-10-CM | POA: Diagnosis not present

## 2019-08-28 DIAGNOSIS — R0902 Hypoxemia: Secondary | ICD-10-CM | POA: Diagnosis not present

## 2019-08-28 DIAGNOSIS — I10 Essential (primary) hypertension: Secondary | ICD-10-CM | POA: Diagnosis not present

## 2019-09-01 ENCOUNTER — Institutional Professional Consult (permissible substitution): Payer: Medicare Other | Admitting: Pulmonary Disease

## 2019-09-08 DIAGNOSIS — E1122 Type 2 diabetes mellitus with diabetic chronic kidney disease: Secondary | ICD-10-CM | POA: Diagnosis not present

## 2019-09-08 DIAGNOSIS — E162 Hypoglycemia, unspecified: Secondary | ICD-10-CM | POA: Diagnosis not present

## 2019-09-08 DIAGNOSIS — G4709 Other insomnia: Secondary | ICD-10-CM | POA: Diagnosis not present

## 2019-09-08 DIAGNOSIS — N182 Chronic kidney disease, stage 2 (mild): Secondary | ICD-10-CM | POA: Diagnosis not present

## 2019-09-08 DIAGNOSIS — M545 Low back pain: Secondary | ICD-10-CM | POA: Diagnosis not present

## 2019-09-08 DIAGNOSIS — R42 Dizziness and giddiness: Secondary | ICD-10-CM | POA: Diagnosis not present

## 2019-09-08 DIAGNOSIS — R4582 Worries: Secondary | ICD-10-CM | POA: Diagnosis not present

## 2019-09-16 DIAGNOSIS — R10817 Generalized abdominal tenderness: Secondary | ICD-10-CM | POA: Diagnosis not present

## 2019-09-16 DIAGNOSIS — Q441 Other congenital malformations of gallbladder: Secondary | ICD-10-CM | POA: Diagnosis not present

## 2019-09-16 DIAGNOSIS — R112 Nausea with vomiting, unspecified: Secondary | ICD-10-CM | POA: Diagnosis not present

## 2019-09-16 DIAGNOSIS — R197 Diarrhea, unspecified: Secondary | ICD-10-CM | POA: Diagnosis not present

## 2019-09-16 DIAGNOSIS — I700 Atherosclerosis of aorta: Secondary | ICD-10-CM | POA: Diagnosis not present

## 2019-09-16 DIAGNOSIS — I6782 Cerebral ischemia: Secondary | ICD-10-CM | POA: Diagnosis not present

## 2019-09-16 DIAGNOSIS — H9190 Unspecified hearing loss, unspecified ear: Secondary | ICD-10-CM | POA: Diagnosis not present

## 2019-09-16 DIAGNOSIS — K5792 Diverticulitis of intestine, part unspecified, without perforation or abscess without bleeding: Secondary | ICD-10-CM | POA: Diagnosis not present

## 2019-09-16 DIAGNOSIS — R609 Edema, unspecified: Secondary | ICD-10-CM | POA: Diagnosis not present

## 2019-09-16 DIAGNOSIS — K449 Diaphragmatic hernia without obstruction or gangrene: Secondary | ICD-10-CM | POA: Diagnosis not present

## 2019-09-16 DIAGNOSIS — R1110 Vomiting, unspecified: Secondary | ICD-10-CM | POA: Diagnosis not present

## 2019-09-16 DIAGNOSIS — R109 Unspecified abdominal pain: Secondary | ICD-10-CM | POA: Diagnosis not present

## 2019-09-16 DIAGNOSIS — R400 Somnolence: Secondary | ICD-10-CM | POA: Diagnosis not present

## 2019-09-16 DIAGNOSIS — Z87891 Personal history of nicotine dependence: Secondary | ICD-10-CM | POA: Diagnosis not present

## 2019-09-16 DIAGNOSIS — R7989 Other specified abnormal findings of blood chemistry: Secondary | ICD-10-CM | POA: Diagnosis not present

## 2019-09-27 DIAGNOSIS — R0902 Hypoxemia: Secondary | ICD-10-CM | POA: Diagnosis not present

## 2019-09-27 DIAGNOSIS — G4733 Obstructive sleep apnea (adult) (pediatric): Secondary | ICD-10-CM | POA: Diagnosis not present

## 2019-09-29 ENCOUNTER — Emergency Department (HOSPITAL_COMMUNITY): Payer: Medicare Other

## 2019-09-29 ENCOUNTER — Encounter (HOSPITAL_COMMUNITY): Payer: Self-pay | Admitting: Emergency Medicine

## 2019-09-29 ENCOUNTER — Other Ambulatory Visit: Payer: Self-pay

## 2019-09-29 ENCOUNTER — Inpatient Hospital Stay (HOSPITAL_COMMUNITY)
Admission: EM | Admit: 2019-09-29 | Discharge: 2019-10-02 | DRG: 395 | Disposition: A | Discharge status: Home / Self Care | Payer: Medicare Other | Attending: Internal Medicine | Admitting: Internal Medicine

## 2019-09-29 DIAGNOSIS — E1159 Type 2 diabetes mellitus with other circulatory complications: Secondary | ICD-10-CM | POA: Diagnosis not present

## 2019-09-29 DIAGNOSIS — Z20822 Contact with and (suspected) exposure to covid-19: Secondary | ICD-10-CM | POA: Diagnosis not present

## 2019-09-29 DIAGNOSIS — Z79891 Long term (current) use of opiate analgesic: Secondary | ICD-10-CM

## 2019-09-29 DIAGNOSIS — K219 Gastro-esophageal reflux disease without esophagitis: Secondary | ICD-10-CM | POA: Diagnosis present

## 2019-09-29 DIAGNOSIS — K5732 Diverticulitis of large intestine without perforation or abscess without bleeding: Secondary | ICD-10-CM | POA: Diagnosis not present

## 2019-09-29 DIAGNOSIS — E039 Hypothyroidism, unspecified: Secondary | ICD-10-CM | POA: Diagnosis present

## 2019-09-29 DIAGNOSIS — K551 Chronic vascular disorders of intestine: Secondary | ICD-10-CM | POA: Diagnosis not present

## 2019-09-29 DIAGNOSIS — R933 Abnormal findings on diagnostic imaging of other parts of digestive tract: Secondary | ICD-10-CM

## 2019-09-29 DIAGNOSIS — Z9841 Cataract extraction status, right eye: Secondary | ICD-10-CM

## 2019-09-29 DIAGNOSIS — J449 Chronic obstructive pulmonary disease, unspecified: Secondary | ICD-10-CM | POA: Diagnosis present

## 2019-09-29 DIAGNOSIS — I252 Old myocardial infarction: Secondary | ICD-10-CM

## 2019-09-29 DIAGNOSIS — T81710A Complication of mesenteric artery following a procedure, not elsewhere classified, initial encounter: Secondary | ICD-10-CM

## 2019-09-29 DIAGNOSIS — Z7982 Long term (current) use of aspirin: Secondary | ICD-10-CM | POA: Diagnosis not present

## 2019-09-29 DIAGNOSIS — Z79899 Other long term (current) drug therapy: Secondary | ICD-10-CM

## 2019-09-29 DIAGNOSIS — M1990 Unspecified osteoarthritis, unspecified site: Secondary | ICD-10-CM | POA: Diagnosis present

## 2019-09-29 DIAGNOSIS — F17210 Nicotine dependence, cigarettes, uncomplicated: Secondary | ICD-10-CM | POA: Diagnosis present

## 2019-09-29 DIAGNOSIS — R1084 Generalized abdominal pain: Secondary | ICD-10-CM | POA: Diagnosis not present

## 2019-09-29 DIAGNOSIS — F419 Anxiety disorder, unspecified: Secondary | ICD-10-CM | POA: Diagnosis present

## 2019-09-29 DIAGNOSIS — Z7951 Long term (current) use of inhaled steroids: Secondary | ICD-10-CM

## 2019-09-29 DIAGNOSIS — I2510 Atherosclerotic heart disease of native coronary artery without angina pectoris: Secondary | ICD-10-CM | POA: Diagnosis present

## 2019-09-29 DIAGNOSIS — Z9049 Acquired absence of other specified parts of digestive tract: Secondary | ICD-10-CM | POA: Diagnosis not present

## 2019-09-29 DIAGNOSIS — K559 Vascular disorder of intestine, unspecified: Secondary | ICD-10-CM

## 2019-09-29 DIAGNOSIS — K5792 Diverticulitis of intestine, part unspecified, without perforation or abscess without bleeding: Secondary | ICD-10-CM

## 2019-09-29 DIAGNOSIS — I10 Essential (primary) hypertension: Secondary | ICD-10-CM | POA: Diagnosis present

## 2019-09-29 DIAGNOSIS — E785 Hyperlipidemia, unspecified: Secondary | ICD-10-CM | POA: Diagnosis present

## 2019-09-29 DIAGNOSIS — I701 Atherosclerosis of renal artery: Secondary | ICD-10-CM | POA: Diagnosis not present

## 2019-09-29 DIAGNOSIS — K529 Noninfective gastroenteritis and colitis, unspecified: Secondary | ICD-10-CM

## 2019-09-29 DIAGNOSIS — Z7902 Long term (current) use of antithrombotics/antiplatelets: Secondary | ICD-10-CM

## 2019-09-29 DIAGNOSIS — Z86010 Personal history of colonic polyps: Secondary | ICD-10-CM

## 2019-09-29 DIAGNOSIS — R197 Diarrhea, unspecified: Secondary | ICD-10-CM | POA: Diagnosis not present

## 2019-09-29 DIAGNOSIS — E782 Mixed hyperlipidemia: Secondary | ICD-10-CM | POA: Diagnosis present

## 2019-09-29 DIAGNOSIS — R109 Unspecified abdominal pain: Secondary | ICD-10-CM | POA: Diagnosis present

## 2019-09-29 DIAGNOSIS — I739 Peripheral vascular disease, unspecified: Secondary | ICD-10-CM | POA: Diagnosis not present

## 2019-09-29 DIAGNOSIS — Z8249 Family history of ischemic heart disease and other diseases of the circulatory system: Secondary | ICD-10-CM | POA: Diagnosis not present

## 2019-09-29 DIAGNOSIS — R112 Nausea with vomiting, unspecified: Secondary | ICD-10-CM | POA: Diagnosis not present

## 2019-09-29 DIAGNOSIS — Z961 Presence of intraocular lens: Secondary | ICD-10-CM | POA: Diagnosis not present

## 2019-09-29 DIAGNOSIS — Z720 Tobacco use: Secondary | ICD-10-CM | POA: Diagnosis present

## 2019-09-29 DIAGNOSIS — G4730 Sleep apnea, unspecified: Secondary | ICD-10-CM | POA: Diagnosis present

## 2019-09-29 DIAGNOSIS — F209 Schizophrenia, unspecified: Secondary | ICD-10-CM | POA: Diagnosis present

## 2019-09-29 DIAGNOSIS — F329 Major depressive disorder, single episode, unspecified: Secondary | ICD-10-CM | POA: Diagnosis present

## 2019-09-29 DIAGNOSIS — F17200 Nicotine dependence, unspecified, uncomplicated: Secondary | ICD-10-CM | POA: Diagnosis not present

## 2019-09-29 DIAGNOSIS — E119 Type 2 diabetes mellitus without complications: Secondary | ICD-10-CM | POA: Diagnosis present

## 2019-09-29 DIAGNOSIS — R079 Chest pain, unspecified: Secondary | ICD-10-CM | POA: Diagnosis not present

## 2019-09-29 LAB — URINALYSIS, ROUTINE W REFLEX MICROSCOPIC
Bilirubin Urine: NEGATIVE
Glucose, UA: NEGATIVE mg/dL
Hgb urine dipstick: NEGATIVE
Ketones, ur: NEGATIVE mg/dL
Leukocytes,Ua: NEGATIVE
Nitrite: NEGATIVE
Protein, ur: NEGATIVE mg/dL
Specific Gravity, Urine: 1.009 (ref 1.005–1.030)
pH: 6 (ref 5.0–8.0)

## 2019-09-29 LAB — GLUCOSE, CAPILLARY
Glucose-Capillary: 84 mg/dL (ref 70–99)
Glucose-Capillary: 92 mg/dL (ref 70–99)

## 2019-09-29 LAB — COMPREHENSIVE METABOLIC PANEL
ALT: 15 U/L (ref 0–44)
AST: 20 U/L (ref 15–41)
Albumin: 2.4 g/dL — ABNORMAL LOW (ref 3.5–5.0)
Alkaline Phosphatase: 66 U/L (ref 38–126)
Anion gap: 8 (ref 5–15)
BUN: 17 mg/dL (ref 8–23)
CO2: 26 mmol/L (ref 22–32)
Calcium: 8.4 mg/dL — ABNORMAL LOW (ref 8.9–10.3)
Chloride: 103 mmol/L (ref 98–111)
Creatinine, Ser: 1.03 mg/dL — ABNORMAL HIGH (ref 0.44–1.00)
GFR calc Af Amer: >60 mL/min (ref >60)
GFR calc non Af Amer: 53 mL/min — ABNORMAL LOW (ref >60)
Glucose, Bld: 142 mg/dL — ABNORMAL HIGH (ref 70–99)
Potassium: 4.4 mmol/L (ref 3.5–5.1)
Sodium: 137 mmol/L (ref 135–145)
Total Bilirubin: 0.4 mg/dL (ref 0.3–1.2)
Total Protein: 6 g/dL — ABNORMAL LOW (ref 6.5–8.1)

## 2019-09-29 LAB — CBC WITH DIFFERENTIAL/PLATELET
Abs Immature Granulocytes: 0.05 10*3/uL (ref 0.00–0.07)
Basophils Absolute: 0 10*3/uL (ref 0.0–0.1)
Basophils Relative: 0 %
Eosinophils Absolute: 0.1 10*3/uL (ref 0.0–0.5)
Eosinophils Relative: 2 %
HCT: 29.1 % — ABNORMAL LOW (ref 36.0–46.0)
Hemoglobin: 9 g/dL — ABNORMAL LOW (ref 12.0–15.0)
Immature Granulocytes: 1 %
Lymphocytes Relative: 11 %
Lymphs Abs: 0.9 10*3/uL (ref 0.7–4.0)
MCH: 30.7 pg (ref 26.0–34.0)
MCHC: 30.9 g/dL (ref 30.0–36.0)
MCV: 99.3 fL (ref 80.0–100.0)
Monocytes Absolute: 0.8 10*3/uL (ref 0.1–1.0)
Monocytes Relative: 9 %
Neutro Abs: 6.5 10*3/uL (ref 1.7–7.7)
Neutrophils Relative %: 77 %
Platelets: 301 10*3/uL (ref 150–400)
RBC: 2.93 MIL/uL — ABNORMAL LOW (ref 3.87–5.11)
RDW: 14 % (ref 11.5–15.5)
WBC: 8.3 10*3/uL (ref 4.0–10.5)
nRBC: 0 % (ref 0.0–0.2)

## 2019-09-29 LAB — LACTIC ACID, PLASMA
Lactic Acid, Venous: 1.7 mmol/L (ref 0.5–1.9)
Lactic Acid, Venous: 1 mmol/L (ref 0.5–1.9)

## 2019-09-29 LAB — LIPASE, BLOOD: Lipase: 23 U/L (ref 11–51)

## 2019-09-29 LAB — SARS CORONAVIRUS 2 BY RT PCR (HOSPITAL ORDER, PERFORMED IN ~~LOC~~ HOSPITAL LAB): SARS Coronavirus 2: NEGATIVE

## 2019-09-29 LAB — PROTIME-INR
INR: 1.2 (ref 0.8–1.2)
Prothrombin Time: 14.3 seconds (ref 11.4–15.2)

## 2019-09-29 LAB — TROPONIN I (HIGH SENSITIVITY)
Troponin I (High Sensitivity): 5 ng/L (ref <18)
Troponin I (High Sensitivity): 5 ng/L (ref <18)

## 2019-09-29 MED ORDER — PRAVASTATIN SODIUM 40 MG PO TABS
80.0000 mg | ORAL_TABLET | Freq: Every day | ORAL | Status: DC
  Administered 2019-09-30 – 2019-10-02 (×3): 80 mg via ORAL
  Filled 2019-09-29 (×3): qty 2

## 2019-09-29 MED ORDER — BUPROPION HCL ER (XL) 150 MG PO TB24
150.0000 mg | ORAL_TABLET | Freq: Every day | ORAL | Status: DC
  Administered 2019-09-30 – 2019-10-02 (×3): 150 mg via ORAL
  Filled 2019-09-29 (×3): qty 1

## 2019-09-29 MED ORDER — SODIUM CHLORIDE 0.9 % IV BOLUS
500.0000 mL | Freq: Once | INTRAVENOUS | Status: AC
  Administered 2019-09-29: 500 mL via INTRAVENOUS

## 2019-09-29 MED ORDER — INSULIN ASPART 100 UNIT/ML ~~LOC~~ SOLN: 0.0000 [IU] | Freq: Three times a day (TID) | SUBCUTANEOUS | Status: DC

## 2019-09-29 MED ORDER — SODIUM CHLORIDE 0.9 % IV SOLN
2.0000 g | Freq: Once | INTRAVENOUS | Status: AC
  Administered 2019-09-29: 2 g via INTRAVENOUS
  Filled 2019-09-29: qty 20

## 2019-09-29 MED ORDER — MORPHINE SULFATE (PF) 2 MG/ML IV SOLN
2.0000 mg | INTRAVENOUS | Status: DC | PRN
  Administered 2019-09-30: 2 mg via INTRAVENOUS
  Filled 2019-09-29: qty 1

## 2019-09-29 MED ORDER — ACETAMINOPHEN 325 MG PO TABS: 650.0000 mg | ORAL_TABLET | Freq: Four times a day (QID) | ORAL | Status: DC | PRN

## 2019-09-29 MED ORDER — ZOLPIDEM TARTRATE 5 MG PO TABS
5.0000 mg | ORAL_TABLET | Freq: Every evening | ORAL | Status: DC | PRN
  Administered 2019-10-01: 5 mg via ORAL
  Filled 2019-09-29: qty 1

## 2019-09-29 MED ORDER — MORPHINE SULFATE (PF) 4 MG/ML IV SOLN
4.0000 mg | Freq: Once | INTRAVENOUS | Status: AC
  Administered 2019-09-29: 4 mg via INTRAVENOUS
  Filled 2019-09-29: qty 1

## 2019-09-29 MED ORDER — AMLODIPINE BESYLATE 5 MG PO TABS
5.0000 mg | ORAL_TABLET | Freq: Every day | ORAL | Status: DC
  Administered 2019-09-30 – 2019-10-01 (×2): 5 mg via ORAL
  Filled 2019-09-29 (×2): qty 1

## 2019-09-29 MED ORDER — IOHEXOL 350 MG/ML SOLN
100.0000 mL | Freq: Once | INTRAVENOUS | Status: AC | PRN
  Administered 2019-09-29: 100 mL via INTRAVENOUS

## 2019-09-29 MED ORDER — LEVOTHYROXINE SODIUM 25 MCG PO TABS
25.0000 ug | ORAL_TABLET | Freq: Every day | ORAL | Status: DC
  Administered 2019-09-30 – 2019-10-02 (×3): 25 ug via ORAL
  Filled 2019-09-29 (×3): qty 1

## 2019-09-29 MED ORDER — GABAPENTIN 300 MG PO CAPS
300.0000 mg | ORAL_CAPSULE | Freq: Two times a day (BID) | ORAL | Status: DC
  Administered 2019-09-29 – 2019-10-02 (×5): 300 mg via ORAL
  Filled 2019-09-29 (×6): qty 1

## 2019-09-29 MED ORDER — ASPIRIN EC 81 MG PO TBEC
81.0000 mg | DELAYED_RELEASE_TABLET | Freq: Every day | ORAL | Status: DC
  Administered 2019-09-30 – 2019-10-02 (×3): 81 mg via ORAL
  Filled 2019-09-29 (×3): qty 1

## 2019-09-29 MED ORDER — PANTOPRAZOLE SODIUM 40 MG PO TBEC
40.0000 mg | DELAYED_RELEASE_TABLET | Freq: Every day | ORAL | Status: DC
  Administered 2019-09-30 – 2019-10-02 (×3): 40 mg via ORAL
  Filled 2019-09-29 (×3): qty 1

## 2019-09-29 MED ORDER — ONDANSETRON HCL 4 MG PO TABS: 4.0000 mg | ORAL_TABLET | Freq: Four times a day (QID) | ORAL | Status: DC | PRN

## 2019-09-29 MED ORDER — ALBUTEROL SULFATE (2.5 MG/3ML) 0.083% IN NEBU
2.5000 mg | INHALATION_SOLUTION | RESPIRATORY_TRACT | Status: DC | PRN
  Administered 2019-09-30: 2.5 mg via RESPIRATORY_TRACT
  Filled 2019-09-29: qty 3

## 2019-09-29 MED ORDER — METRONIDAZOLE IN NACL 5-0.79 MG/ML-% IV SOLN
500.0000 mg | Freq: Once | INTRAVENOUS | Status: AC
  Administered 2019-09-29: 500 mg via INTRAVENOUS
  Filled 2019-09-29: qty 100

## 2019-09-29 MED ORDER — SODIUM CHLORIDE 0.9 % IV SOLN: INTRAVENOUS | Status: DC

## 2019-09-29 MED ORDER — CLOPIDOGREL BISULFATE 75 MG PO TABS
75.0000 mg | ORAL_TABLET | Freq: Every day | ORAL | Status: DC
  Administered 2019-09-30 – 2019-10-02 (×3): 75 mg via ORAL
  Filled 2019-09-29 (×3): qty 1

## 2019-09-29 MED ORDER — NITROGLYCERIN 0.4 MG SL SUBL: 0.4000 mg | SUBLINGUAL_TABLET | SUBLINGUAL | Status: DC | PRN

## 2019-09-29 MED ORDER — METRONIDAZOLE IN NACL 5-0.79 MG/ML-% IV SOLN
500.0000 mg | Freq: Three times a day (TID) | INTRAVENOUS | Status: DC
  Administered 2019-09-29 – 2019-10-02 (×8): 500 mg via INTRAVENOUS
  Filled 2019-09-29 (×8): qty 100

## 2019-09-29 MED ORDER — METOPROLOL TARTRATE 25 MG PO TABS
12.5000 mg | ORAL_TABLET | Freq: Two times a day (BID) | ORAL | Status: DC
  Administered 2019-09-29 – 2019-09-30 (×3): 12.5 mg via ORAL
  Filled 2019-09-29 (×4): qty 1

## 2019-09-29 MED ORDER — ONDANSETRON HCL 4 MG/2ML IJ SOLN
4.0000 mg | Freq: Four times a day (QID) | INTRAMUSCULAR | Status: DC | PRN
  Administered 2019-09-30 – 2019-10-01 (×2): 4 mg via INTRAVENOUS
  Filled 2019-09-29 (×2): qty 2

## 2019-09-29 MED ORDER — ISOSORBIDE MONONITRATE ER 60 MG PO TB24
60.0000 mg | ORAL_TABLET | Freq: Every day | ORAL | Status: DC
  Administered 2019-09-30 – 2019-10-02 (×3): 60 mg via ORAL
  Filled 2019-09-29 (×3): qty 1

## 2019-09-29 MED ORDER — ENOXAPARIN SODIUM 60 MG/0.6ML ~~LOC~~ SOLN
50.0000 mg | SUBCUTANEOUS | Status: DC
  Administered 2019-09-29 – 2019-10-01 (×3): 50 mg via SUBCUTANEOUS
  Filled 2019-09-29 (×3): qty 0.6

## 2019-09-29 MED ORDER — INSULIN ASPART 100 UNIT/ML ~~LOC~~ SOLN: 0.0000 [IU] | Freq: Every day | SUBCUTANEOUS | Status: DC

## 2019-09-29 MED ORDER — ACETAMINOPHEN 650 MG RE SUPP: 650.0000 mg | Freq: Four times a day (QID) | RECTAL | Status: DC | PRN

## 2019-09-29 MED ORDER — ONDANSETRON HCL 4 MG/2ML IJ SOLN
4.0000 mg | Freq: Once | INTRAMUSCULAR | Status: AC
  Administered 2019-09-29: 4 mg via INTRAVENOUS
  Filled 2019-09-29: qty 2

## 2019-09-29 MED ORDER — OXYCODONE-ACETAMINOPHEN 5-325 MG PO TABS
1.0000 | ORAL_TABLET | ORAL | Status: DC | PRN
  Administered 2019-09-29 – 2019-10-01 (×7): 1 via ORAL
  Filled 2019-09-29 (×7): qty 1

## 2019-09-29 MED ORDER — SODIUM CHLORIDE 0.9 % IV SOLN
2.0000 g | INTRAVENOUS | Status: DC
  Administered 2019-09-30 – 2019-10-02 (×3): 2 g via INTRAVENOUS
  Filled 2019-09-29 (×3): qty 20

## 2019-09-29 NOTE — ED Provider Notes (Signed)
[facility] EMERGENCY DEPARTMENT Provider Note   CSN: [id number] Arrival date & time: 09/29/19  [id number]     History Chief Complaint  Patient presents with  . Abdominal Pain    [name] is a 74 y.o. female.  She has a history of CHF, CAD, and recently had a hemicolectomy and SMA bypass due to atherosclerosis.  This was done at [facility].  She is complaining of 3 weeks of stabbing abdominal pain associated with some loose stools nausea and vomiting.  She talk to her doctors at [facility] who recommended that she be seen there but she said the pain was too bad today that she presented here.  No fevers or chills.  Has some burning pain in her upper abdomen that radiates into her chest.  No fevers or chills.  Her abdominal pain is primarily in the right side of her abdomen although it is also diffuse.  The following is an excerpt from her follow-up at [facility] Course: [name] a 74 y.o.femalewho presents for post-op follow-up.She presented to the ED with acute onset severe abdominal pain. CT at outside facility demonstrated pneumatasis in the right colon and portal venous gas. CTA on admission demonstrated ahigh grade stenosis of the celiac and occlusion of the proximal SMA. If there is wide spread ischemic bowel along the SMA distribution. She was taken emergently to the OR by EGS for an exploratory laparotomy to evaluate her small bowel. Vascular was called into the OR for SMA bypass as her small bowel was felt to be viable.    The history is provided by the patient.  Abdominal Pain Pain location:  Generalized Pain quality: cramping and stabbing   Pain radiates to:  Chest Pain severity:  Severe Onset quality:  Gradual Timing:  Constant Progression:  Worsening Chronicity:  Recurrent Context: not recent travel, not sick contacts and not trauma   Relieved by:  Nothing Worsened by:  Nothing Ineffective treatments:  None tried Associated symptoms:  chest pain, diarrhea, nausea, shortness of breath and vomiting   Associated symptoms: no constipation, no cough, no dysuria, no fever, no hematemesis, no hematochezia, no hematuria, no melena and no sore throat   Risk factors: recent hospitalization        Past Medical History:  Diagnosis Date  . Acute ischemic colitis ([place]) 09/11/2005  . Anxiety   . Arthritis   . Atherosclerotic vascular disease    Calcified plaque at the origin of the celiac and SMA  . CHF (congestive heart failure) ([place])   . Coronary atherosclerosis of native coronary artery    Mulitvessel LVEF 50-50%, DES circ 1/11, occluded SVG to OM and SVG to diagonal , LVEF 60%  . Depression   . Diverticulosis of colon   . Essential hypertension   . Hypothyroidism   . Mesenteric ischemia ([place])   . Mitral regurgitation    Mild to moderate  . Mixed hyperlipidemia   . Myocardial infarction ([place]) 1990  . Obesity   . Orthostatic hypotension   . PAD (peripheral artery disease) ([place])   . PVC's (premature ventricular contractions)   . Schizophrenia ([place])   . Sleep apnea    CPAP  . Tubular adenoma   . Type 2 diabetes mellitus [facility])     Patient Active Problem List   Diagnosis Date Noted  . COPD (chronic obstructive pulmonary disease) ([place]) 09/27/2017  . OSA (obstructive sleep apnea) 09/24/2017  . MDD (major depressive disorder), recurrent episode, moderate ([place]) 11/05/2016  .  Chronic mesenteric ischemia ([place]) 08/12/2012  . Chronic diarrhea 03/18/2012  . Abdominal pain 03/18/2012  . Unintentional weight loss 03/18/2012  . Carotid bruit 01/03/2012  . Palpitations 06/04/2011  . Depression 06/04/2011  . SINUS BRADYCARDIA 10/13/2009  . ORTHOSTATIC HYPOTENSION 10/04/2009  . Dyspnea on exertion 05/06/2009  . MITRAL REGURGITATION 03/18/2009  . TOBACCO USER 02/11/2009  . HYPERLIPIDEMIA-MIXED 01/19/2009  . Essential hypertension, benign 01/19/2009  . CAD, NATIVE VESSEL 01/19/2009    Past Surgical History:  Procedure  Laterality Date  . BACK SURGERY     Lumbar spine surgery  . Carotid endarectomy Bilateral   . CARPAL TUNNEL RELEASE     Bilateral  . CATARACT EXTRACTION W/PHACO Right 08/24/2013   Procedure: CATARACT EXTRACTION PHACO AND INTRAOCULAR LENS PLACEMENT (IOC);  Surgeon: Tonny Branch, MD;  Location: [facility];  Service: Ophthalmology;  Laterality: Right;  CDE 8.68  . COLONOSCOPY  09/14/2005   Dr. Leonard Schwartz colitis  . COLONOSCOPY WITH ESOPHAGOGASTRODUODENOSCOPY (EGD)  04/14/2012   Dr. Gala Romney- EGD= gastric erosions of doubtful clinical significance per bx- chronic inactive gastritis, benign small bowel mucosa. TCS=colonic diverticulosis, tubular adenoma  . CORONARY ARTERY BYPASS GRAFT     LIMA-LAD; SVG-OM; SVG-DX in 1995 [place]  . NECK SURGERY     Cervical laminectomy  . PARTIAL HYSTERECTOMY    . stents x4       OB History   No obstetric history on file.     Family History  Problem Relation Age of Onset  . Alcohol abuse Mother   . Alcohol abuse Father   . Coronary artery disease Other   . Hypertension Other   . Crohn's disease Sister 63  . Stroke Daughter     Social History   Tobacco Use  . Smoking status: Former Smoker    Packs/day: 0.50    Years: 50.00    Pack years: 25.00    Types: Cigarettes    Start date: 12/30/2018  . Smokeless tobacco: Never Used  Substance Use Topics  . Alcohol use: No    Alcohol/week: 0.0 standard drinks    Comment: Rare etoh 3-4 drinks per yr, hx beer heavily for 10-12 yrs, quit heavy etoh 1990  . Drug use: No    Home Medications Prior to Admission medications   Medication Sig Start Date End Date Taking? Authorizing Provider  aspirin EC 81 MG tablet Take 81 mg by mouth daily.    [provider]  buPROPion (WELLBUTRIN XL) 150 MG 24 hr tablet Take 150 mg by mouth in the morning and at bedtime.    [provider]  clopidogrel (PLAVIX) 75 MG tablet Take 75 mg by mouth daily.      [provider]  gabapentin (NEURONTIN) 300  MG capsule Take 300 mg by mouth 2 (two) times daily.    [provider]  isosorbide mononitrate (IMDUR) 60 MG 24 hr tablet Take 60 mg by mouth daily.  10/04/17   [provider]  metoprolol tartrate (LOPRESSOR) 25 MG tablet Take 0.5 tablets (12.5 mg total) by mouth 2 (two) times daily. 07/31/19   Satira Sark, MD  nitroGLYCERIN (NITROSTAT) 0.4 MG SL tablet Place 1 tablet (0.4 mg total) under the tongue every 5 (five) minutes as needed. 02/16/11   Satira Sark, MD  oxyCODONE-acetaminophen (PERCOCET/ROXICET) 5-325 MG tablet Take 1 tablet by mouth every 4 (four) hours as needed for severe pain.    [provider]  pantoprazole (PROTONIX) 40 MG tablet Take 40 mg by mouth daily.  [provider]  polyethylene glycol (MIRALAX / GLYCOLAX) 17 g packet Take 17 g by mouth daily.    [provider]  pravastatin (PRAVACHOL) 80 MG tablet Take 80 mg by mouth daily.      [provider]  SPIRIVA HANDIHALER 18 MCG inhalation capsule 1 capsule daily. 07/02/19   [provider]  Tiotropium Bromide-Olodaterol (STIOLTO RESPIMAT) 2.5-2.5 MCG/ACT AERS Inhale 2 puffs into the lungs daily. 09/27/17   Collene Gobble, MD  torsemide (DEMADEX) 20 MG tablet Take 2 tablets (40 mg total) by mouth as needed. 06/26/19   Satira Sark, MD  zolpidem (AMBIEN) 10 MG tablet Take 10 mg by mouth at bedtime as needed for sleep.    [provider]    Allergies    Patient has no known allergies.  Review of Systems   Review of Systems  Constitutional: Negative for fever.  HENT: Negative for sore throat.   Eyes: Negative for visual disturbance.  Respiratory: Positive for shortness of breath. Negative for cough.   Cardiovascular: Positive for chest pain.  Gastrointestinal: Positive for abdominal pain, diarrhea, nausea and vomiting. Negative for constipation, hematemesis, hematochezia and melena.  Genitourinary: Negative for dysuria and hematuria.    Musculoskeletal: Negative for neck pain.  Skin: Negative for rash.  Neurological: Negative for headaches.    Physical Exam Updated Vital Signs BP (!) 129/30   Pulse 60   Temp 97.7 F (36.5 C) (Oral)   Resp (!) 22   Ht 5\' 3"  (1.6 m)   Wt 108.9 kg   SpO2 99%   BMI 42.51 kg/m   Physical Exam Vitals and nursing note reviewed.  Constitutional:      General: She is not in acute distress.    Appearance: She is well-developed.  HENT:     Head: Normocephalic and atraumatic.  Eyes:     Conjunctiva/sclera: Conjunctivae normal.  Cardiovascular:     Rate and Rhythm: Normal rate and regular rhythm.     Heart sounds: No murmur.  Pulmonary:     Effort: Pulmonary effort is normal. No respiratory distress.     Breath sounds: Normal breath sounds.  Abdominal:     Palpations: Abdomen is soft.     Tenderness: There is generalized abdominal tenderness. There is no guarding or rebound.     Comments: She has a healing midline surgical scar.  She also some bruising in her left lower quadrant, unclear etiology  Musculoskeletal:        General: No tenderness. Normal range of motion.     Cervical back: Neck supple.     Right lower leg: Edema present.     Left lower leg: Edema present.  Skin:    General: Skin is warm and dry.     Capillary Refill: Capillary refill takes less than 2 seconds.  Neurological:     General: No focal deficit present.     Mental Status: She is alert.     ED Results / Procedures / Treatments   Labs (all labs ordered are listed, but only abnormal results are displayed) Labs Reviewed  COMPREHENSIVE METABOLIC PANEL - Abnormal; Notable for the following components:      Result Value   Glucose, Bld 142 (*)    Creatinine, Ser 1.03 (*)    Calcium 8.4 (*)    Total Protein 6.0 (*)    Albumin 2.4 (*)    GFR calc non Af Amer 53 (*)    All other components within normal limits  CBC WITH DIFFERENTIAL/PLATELET - Abnormal; Notable for the following components:   RBC  2.93 (*)    Hemoglobin 9.0 (*)    HCT 29.1 (*)    All other components within normal limits  URINALYSIS, ROUTINE W REFLEX MICROSCOPIC - Abnormal; Notable for the following components:   APPearance HAZY (*)    All other components within normal limits  SARS CORONAVIRUS 2 BY RT PCR (HOSPITAL ORDER, [place] [facility])  LIPASE, BLOOD  LACTIC ACID, PLASMA  LACTIC ACID, PLASMA  PROTIME-INR  GLUCOSE, CAPILLARY  HEMOGLOBIN A1C  CBC  CREATININE, SERUM  CBC  COMPREHENSIVE METABOLIC PANEL  TROPONIN I (HIGH SENSITIVITY)  TROPONIN I (HIGH SENSITIVITY)    EKG EKG Interpretation  Date/Time:  Tuesday September 29 2019 08:35:11 EDT Ventricular Rate:  70 PR Interval:    QRS Duration: 94 QT Interval:  420 QTC Calculation: [id number] R Axis:   35 Text Interpretation: Sinus or ectopic atrial rhythm Low voltage, precordial leads Probable anteroseptal infarct, old No significant change since prior 4/15 Confirmed by Aletta Edouard [phone]) on 09/29/2019 8:50:45 AM   Radiology CT Angio Abd/Pel W and/or Wo Contrast  Result Date: 09/29/2019 CLINICAL DATA:  74 year old with abdominal pain. Evaluate for acute mesenteric ischemia. History of partial hysterectomy. History of right hemicolectomy. EXAM: CT ANGIOGRAPHY ABDOMEN AND PELVIS WITH CONTRAST AND WITHOUT CONTRAST TECHNIQUE: Multidetector CT imaging of the abdomen and pelvis was performed using the standard protocol during bolus administration of intravenous contrast. Multiplanar reconstructed images and MIPs were obtained and reviewed to evaluate the vascular anatomy. CONTRAST:  178mL OMNIPAQUE IOHEXOL 350 MG/ML SOLN COMPARISON:  CT abdomen and pelvis without contrast 09/16/2019 and CT 06/13/2019 and CTA from 09/13/2005 FINDINGS: VASCULAR Aorta: Extensive wall calcifications involving the abdominal aorta without aortic aneurysm or dissection. Narrowing at the aortic bifurcation. Large lumbar artery in the distal abdominal aorta. Celiac: Celiac  trunk is patent. Main branch vessels are patent. Atherosclerotic calcifications at the origin of the celiac trunk and there appears to be high-grade stenosis at the celiac artery origin. SMA: Large amount of calcified plaque involving the origin and proximal aspect of the SMA. Origin and proximal SMA are occluded. There is reconstitution of the SMA approximately 5 cm from the origin. There appears to be a surgical bypass supplying flow to the SMA which originates from the distal aspect of the right common iliac artery. Main SMA branch vessels beyond the bypass appear to be patent. Renals: Single bilateral renal arteries. Bilateral renal arteries are heavily calcified and suspect high-grade stenosis near the origin of the right renal artery. Due to small size of the renal arteries, is difficult to estimate the degree of stenosis. Probably less than 50% stenosis involving the origin of the left renal artery but there may be additional stenosis in the proximal left renal artery. No evidence for a renal artery aneurysm. IMA: Origin of the IMA is occluded. Evidence for some reconstitution of IMA. Inflow: Extensive calcified plaque at the aortic bifurcation. There appears to be near complete occlusion at the origin of the right common iliac artery. Ectasia of the right common iliac artery measuring up to 1.2 cm may represent post stenotic dilatation. There is a patent surgical bypass between the SMA and the distal right common iliac artery. Calcified plaque at the right iliac artery bifurcation and suspect at least 50% stenosis at the origin of the right external iliac artery. Right external iliac artery is patent. Stenosis at the origin of the right hypogastric artery. Greater  than 70% stenosis at the origin of the left common iliac artery with post stenotic dilatation / aneurysm of the left common iliac artery measuring up to 1.9 cm. At least mild stenosis at the origin of the left hypogastric artery. Left external  iliac artery is patent without significant stenosis. Proximal Outflow: Calcified plaque involving the bilateral common femoral arteries. Proximal femoral arteries are patent bilaterally. Veins: Contrast refluxes into the hepatic veins suggesting increased right heart pressures. Portal venous system is patent. No gross abnormality to the iliac veins or IVC. Bilateral renal veins are patent. Review of the MIP images confirms the above findings. NON-VASCULAR Lower chest: Lung bases are clear.  No pleural effusions. Hepatobiliary: 6 mm hypodensity at the hepatic dome is likely an incidental finding and probably represents a hepatic cyst. No suspicious liver lesions. Normal appearance of the gallbladder. No biliary dilatation. Pancreas: Poorly defined structure along the caudal aspect of the pancreatic body on sequence 5, image 28 measures 3.0 x 2.2 cm. This was not present in February 2021 but was present on the exam from 09/16/2019. Suspect this represents a postsurgical or postinflammatory structure. No evidence for pancreatic duct dilatation. Spleen: Normal in size without focal abnormality. Adrenals/Urinary Tract: Normal adrenal glands. Normal appearance of the urinary bladder. No suspicious renal lesions. Negative for hydronephrosis. Stomach/Bowel: Normal appearance of the stomach and duodenum. Again noted is diverticulosis involving the proximal sigmoid colon with mild pericolonic edema. Compared to the recent examination, there is increased stranding and trace fluid in the left paracolic gutter. Pericolonic stranding or edema is best seen on on sequence 4, image 87. Postsurgical changes compatible with right hemicolectomy. No evidence for bowel obstruction. No evidence for pneumatosis. Lymphatic: Small lymph nodes in the upper abdomen. No significant abdominal or pelvic lymphadenopathy. Reproductive: Status post hysterectomy. No adnexal masses. Other: No significant ascites. There is no evidence for free  intraperitoneal air. Negative for an abscess collection. Musculoskeletal: Postsurgical changes along the anterior abdomen compatible with previous abdominal surgery. Laminectomy changes in the lower lumbar spine. Grade 1 anterolisthesis at L4-L5 with vacuum disc. Multilevel vacuum disc. Disc space narrowing at L5-S1. IMPRESSION: VASCULAR 1. Extensive atherosclerotic disease involving the abdominal aorta and visceral arteries. 2. 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 There is a surgical bypass between the distal right common iliac artery and the superior mesenteric artery. This surgical bypass is patent. However, there is a high-grade stenosis or near occlusion at the origin of the right common iliac artery that is supplying the SMA bypass graft. 3. Extensive atherosclerotic disease at the aortic bifurcation. Near occlusion of the right common iliac artery at the origin. Severe stenosis at the origin of the left common iliac artery measuring greater than 70%. Evidence for post stenotic dilatation of the common iliac arteries. 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 4. Probable high-grade stenosis at the origin of the celiac trunk. 5. Bilateral renal artery stenosis. NON-VASCULAR 1. Pericolonic stranding and inflammatory changes centered around the sigmoid colon and left colon. Pericolonic inflammatory changes in the sigmoid colon region have minimally changed from 09/16/2019 at [facility]. A small amount of stranding and trace fluid in the left paracolic gutter is new since the exam on 09/16/2019. Patient has multiple colonic diverticula and it is possible the findings are related to diverticulitis. However, ischemic colitis is in the differential diagnosis based on the vascular disease and extent of the colonic disease. No evidence for pneumatosis or  free air at this time. 2. Indeterminate collection or structure just caudal to the pancreatic  body. This structure is new since February of 2021. This could represent a small pancreatic pseudocyst or persistent small postoperative fluid collection. No evidence for acute pancreatic inflammation. Electronically Signed   By: Markus Daft M.D.   On: 09/29/2019 12:15    Procedures Procedures (including critical care time)  Medications Ordered in ED Medications  aspirin EC tablet 81 mg (has no administration in time range)  oxyCODONE-acetaminophen (PERCOCET/ROXICET) 5-325 MG per tablet 1 tablet (has no administration in time range)  pravastatin (PRAVACHOL) tablet 80 mg (has no administration in time range)  nitroGLYCERIN (NITROSTAT) SL tablet 0.4 mg (has no administration in time range)  metoprolol tartrate (LOPRESSOR) tablet 12.5 mg (has no administration in time range)  isosorbide mononitrate (IMDUR) 24 hr tablet 60 mg (has no administration in time range)  amLODipine (NORVASC) tablet 5 mg (has no administration in time range)  buPROPion (WELLBUTRIN XL) 24 hr tablet 150 mg (has no administration in time range)  levothyroxine (SYNTHROID) tablet 25 mcg (has no administration in time range)  zolpidem (AMBIEN) tablet 10 mg (has no administration in time range)  pantoprazole (PROTONIX) EC tablet 40 mg (has no administration in time range)  clopidogrel (PLAVIX) tablet 75 mg (has no administration in time range)  gabapentin (NEURONTIN) capsule 300 mg (has no administration in time range)  enoxaparin (LOVENOX) injection 40 mg (has no administration in time range)  0.9 %  sodium chloride infusion (has no administration in time range)  acetaminophen (TYLENOL) tablet 650 mg (has no administration in time range)    Or  acetaminophen (TYLENOL) suppository 650 mg (has no administration in time range)  morphine 2 MG/ML injection 2 mg (has no administration in time range)  ondansetron (ZOFRAN) tablet 4 mg (has no administration in time range)    Or  ondansetron (ZOFRAN) injection 4 mg (has no  administration in time range)  albuterol (PROVENTIL) (2.5 MG/3ML) 0.083% nebulizer solution 2.5 mg (has no administration in time range)  insulin aspart (novoLOG) injection 0-15 Units (has no administration in time range)  insulin aspart (novoLOG) injection 0-5 Units (has no administration in time range)  cefTRIAXone (ROCEPHIN) 2 g in sodium chloride 0.9 % 100 mL IVPB (has no administration in time range)  metroNIDAZOLE (FLAGYL) IVPB 500 mg (has no administration in time range)  morphine 4 MG/ML injection 4 mg (4 mg Intravenous Given 09/29/19 0829)  sodium chloride 0.9 % bolus 500 mL (0 mLs Intravenous Stopped 09/29/19 1143)  ondansetron (ZOFRAN) injection 4 mg (4 mg Intravenous Given 09/29/19 0829)  iohexol (OMNIPAQUE) 350 MG/ML injection 100 mL (100 mLs Intravenous Contrast Given 09/29/19 1034)  cefTRIAXone (ROCEPHIN) 2 g in sodium chloride 0.9 % 100 mL IVPB (0 g Intravenous Stopped 09/29/19 1417)    And  metroNIDAZOLE (FLAGYL) IVPB 500 mg (0 mg Intravenous Stopped 09/29/19 1444)    ED Course  I have reviewed the triage vital signs and the nursing notes.  Pertinent labs & imaging results that were available during my care of the patient were reviewed by me and considered in my medical decision making (see chart for details).  Clinical Course as of Sep 28 1808  Tue Sep 29, 2019  1112 Hemoglobin was 10.7 2 weeks ago.   [MB]  [id number] Viewed report with Dr. Oletta Lamas vascular surgery at [facility].  We are trying to push the images over so he can see the images.   [MB]  1311  Dr. Oletta Lamas was able to review the images.  He thinks the bypass looks fine.  He does not feel this is a vascular issue.  Does not require transfer.   [MB]  46 Discussed with [facility] hospitalist Dr Roderic Palau will evaluate the patient for admission.   [MB]    Clinical Course User Index [MB] Hayden Rasmussen, MD   MDM Rules/Calculators/A&P                     This patient complains of generalized abdominal pain nausea vomiting diarrhea  and poor appetite; this involves an extensive number of treatment Options and is a complaint that carries with it a high risk of complications and Morbidity. The differential includes anemia, infection, colitis, diverticulitis, bowel ischemia, obstruction  I ordered, reviewed and interpreted labs, which included CBC with normal white count, slightly lower hemoglobin than chemistries fairly unremarkable other than a low calcium and low albumin urinalysis no signs of infection, normal lactate making ischemia is less likely I ordered medication IV fluids and pain medicine, IV antibiotic I ordered imaging studies which included CT angio abdomen and pelvis and I independently    visualized and interpreted imaging which showed significant atherosclerotic disease and possible stenotic areas.  Also has evidence of some diverticulitis although cannot exclude ischemia Additional history obtained from patient's family member Previous records obtained and reviewed in epic including operative note from [facility] I consulted Dr. Oletta Lamas vascular at [facility] and discussed lab and imaging findings.  He reviewed the images independently and did not feel this represented an acute vascular phenomenon.  He did not see any need for patient to be transferred to [facility] for evaluation.  I consulted Dr. Roderic Palau [facility] hospitalist and he will evaluate the patient for admission.  Critical Interventions: None  After the interventions stated above, I reevaluated the patient and found patient to be symptomatically improved.  I recommended that she be admitted to the hospital for further management with IV antibiotics and better pain and nausea control.  She is comfortable with plan.  Final Clinical Impression(s) / ED Diagnoses Final diagnoses:  Generalized abdominal pain  Acute diverticulitis  Vascular complications of mesenteric artery, initial encounter    Rx / DC Orders ED Discharge Orders    None       Hayden Rasmussen, MD 09/29/19 [phone]

## 2019-09-29 NOTE — H&P (Signed)
History and Physical    [name] [id number] DOB: [birth year] DOA: 09/29/2019  PCP: Patient, No Pcp Per  Patient coming from: Home  I have personally briefly reviewed patient's old medical records in [place]  Chief Complaint: Abdominal pain  HPI: [name] is a 74 y.o. female with medical history significant of chronic mesenteric ischemia status post iliac to SMA bypass at [facility], status post right hemicolectomy due to bowel ischemia, presents to the emergency room with complaints of abdominal pain.  Patient reports onset of symptoms approximately 3 weeks ago.  She has had persistent diffuse abdominal pain, more so on the right side.  She is associated nausea and vomiting over the past week.  She describes vomiting after having any p.o. intake.  She also reports dark green-colored stools which have been very frequent.  Although, she does have a history of chronic diarrhea.  She denies any fever.  She did not have any shortness of breath, cough, chest pain.  She was evaluated the emergency room where a CT scan of the abdomen pelvis showed patent bypass, but severe bilateral iliac disease.  Case was reviewed by EDP with patient's primary vascular surgeon, Dr. Oletta Lamas at [facility].  Images were reviewed and it was not felt that patient symptoms were related to an issue with blood flow.  Regardless, it was not felt that any further vascular intervention was indicated/could be offered at this time.  The patient has been referred for admission.   Review of Systems: As per HPI otherwise 10 point review of systems negative.    Past Medical History:  Diagnosis Date  . Acute ischemic colitis ([place]) 09/11/2005  . Anxiety   . Arthritis   . Atherosclerotic vascular disease    Calcified plaque at the origin of the celiac and SMA  . CHF (congestive heart failure) ([place])   . Coronary atherosclerosis of native coronary artery    Mulitvessel LVEF 50-50%, DES circ 1/11,  occluded SVG to OM and SVG to diagonal , LVEF 60%  . Depression   . Diverticulosis of colon   . Essential hypertension   . Hypothyroidism   . Mesenteric ischemia ([place])   . Mitral regurgitation    Mild to moderate  . Mixed hyperlipidemia   . Myocardial infarction ([place]) 1990  . Obesity   . Orthostatic hypotension   . PAD (peripheral artery disease) ([place])   . PVC's (premature ventricular contractions)   . Schizophrenia ([place])   . Sleep apnea    CPAP  . Tubular adenoma   . Type 2 diabetes mellitus ([place])     Past Surgical History:  Procedure Laterality Date  . BACK SURGERY     Lumbar spine surgery  . Carotid endarectomy Bilateral   . CARPAL TUNNEL RELEASE     Bilateral  . CATARACT EXTRACTION W/PHACO Right 08/24/2013   Procedure: CATARACT EXTRACTION PHACO AND INTRAOCULAR LENS PLACEMENT (IOC);  Surgeon: Tonny Branch, MD;  Location: [facility];  Service: Ophthalmology;  Laterality: Right;  CDE 8.68  . COLONOSCOPY  09/14/2005   Dr. Leonard Schwartz colitis  . COLONOSCOPY WITH ESOPHAGOGASTRODUODENOSCOPY (EGD)  04/14/2012   Dr. Gala Romney- EGD= gastric erosions of doubtful clinical significance per bx- chronic inactive gastritis, benign small bowel mucosa. TCS=colonic diverticulosis, tubular adenoma  . CORONARY ARTERY BYPASS GRAFT     LIMA-LAD; SVG-OM; SVG-DX in 1995 [place]  . NECK SURGERY     Cervical laminectomy  . PARTIAL HYSTERECTOMY    . stents x4  Social History:  reports that she has quit smoking. Her smoking use included cigarettes. She started smoking about 8 months ago. She has a 25.00 pack-year smoking history. She has never used smokeless tobacco. She reports that she does not drink alcohol or use drugs.  No Known Allergies  Family History  Problem Relation Age of Onset  . Alcohol abuse Mother   . Alcohol abuse Father   . Coronary artery disease Other   . Hypertension Other   . Crohn's disease Sister 68  . Stroke Daughter      Prior to Admission medications     Medication Sig Start Date End Date Taking? Authorizing Provider  aspirin EC 81 MG tablet Take 81 mg by mouth daily.   Yes [provider]  buPROPion (WELLBUTRIN XL) 150 MG 24 hr tablet Take 150 mg by mouth in the morning and at bedtime.   Yes [provider]  clopidogrel (PLAVIX) 75 MG tablet Take 75 mg by mouth daily.     Yes [provider]  isosorbide mononitrate (IMDUR) 60 MG 24 hr tablet Take 60 mg by mouth daily.  10/04/17  Yes [provider]  metoprolol tartrate (LOPRESSOR) 25 MG tablet Take 0.5 tablets (12.5 mg total) by mouth 2 (two) times daily. 07/31/19  Yes Satira Sark, MD  nitroGLYCERIN (NITROSTAT) 0.4 MG SL tablet Place 1 tablet (0.4 mg total) under the tongue every 5 (five) minutes as needed. 02/16/11  Yes Satira Sark, MD  pantoprazole (PROTONIX) 40 MG tablet Take 40 mg by mouth daily.   Yes [provider]  pravastatin (PRAVACHOL) 80 MG tablet Take 80 mg by mouth daily.     Yes [provider]  SPIRIVA HANDIHALER 18 MCG inhalation capsule 1 capsule daily. 07/02/19  Yes [provider]  Tiotropium Bromide-Olodaterol (STIOLTO RESPIMAT) 2.5-2.5 MCG/ACT AERS Inhale 2 puffs into the lungs daily. 09/27/17  Yes Collene Gobble, MD  torsemide (DEMADEX) 20 MG tablet Take 2 tablets (40 mg total) by mouth as needed. 06/26/19  Yes Satira Sark, MD  zolpidem (AMBIEN) 10 MG tablet Take 10 mg by mouth at bedtime as needed for sleep.   Yes [provider]  amLODipine (NORVASC) 5 MG tablet Take 5 mg by mouth daily. 08/04/19   [provider]  gabapentin (NEURONTIN) 300 MG capsule Take 300 mg by mouth 2 (two) times daily.    [provider]  levothyroxine (SYNTHROID) 25 MCG tablet Take 1 tablet by mouth daily. 09/09/19   [provider]  metFORMIN (GLUCOPHAGE) 500 MG tablet Take 500 mg by mouth 2 (two) times daily. 08/04/19   [provider]  oxyCODONE-acetaminophen (PERCOCET/ROXICET)  5-325 MG tablet Take 1 tablet by mouth every 4 (four) hours as needed for severe pain.    [provider]  polyethylene glycol (MIRALAX / GLYCOLAX) 17 g packet Take 17 g by mouth daily.    [provider]    Physical Exam: Vitals:   09/29/19 1445 09/29/19 1500 09/29/19 1530 09/29/19 1700  BP:  (!) 95/35 (!) 99/44 (!) 107/35  Pulse: 69 (!) 58 (!) 58 64  Resp: 18 16 16 20   Temp:    98.2 F (36.8 C)  TempSrc:    Oral  SpO2: 96% 97% 98% 99%  Weight:    106 kg  Height:    5\' 3"  (1.6 m)    Constitutional: NAD, calm, comfortable Eyes: PERRL, lids and conjunctivae normal ENMT: Mucous membranes are moist. Posterior pharynx clear  of any exudate or lesions.Normal dentition.  Neck: normal, supple, no masses, no thyromegaly Respiratory: clear to auscultation bilaterally, no wheezing, no crackles. Normal respiratory effort. No accessory muscle use.  Cardiovascular: Regular rate and rhythm, no murmurs / rubs / gallops. No extremity edema. 2+ pedal pulses. No carotid bruits.  Abdomen: no tenderness, no masses palpated. No hepatosplenomegaly. Bowel sounds positive.  Musculoskeletal: no clubbing / cyanosis. No joint deformity upper and lower extremities. Good ROM, no contractures. Normal muscle tone.  Skin: no rashes, lesions, ulcers. No induration Neurologic: CN 2-12 grossly intact. Sensation intact, DTR normal. Strength 5/5 in all 4.  Psychiatric: Normal judgment and insight. Alert and oriented x 3. Normal mood.    Labs on Admission: I have personally reviewed following labs and imaging studies  CBC: Recent Labs  Lab 09/29/19 0831  WBC 8.3  NEUTROABS 6.5  HGB 9.0*  HCT 29.1*  MCV 99.3  PLT [id number]   Basic Metabolic Panel: Recent Labs  Lab 09/29/19 0831  NA 137  K 4.4  CL 103  CO2 26  GLUCOSE 142*  BUN 17  CREATININE 1.03*  CALCIUM 8.4*   GFR: Estimated Creatinine Clearance: 55 mL/min (A) (by C-G formula based on SCr of 1.03 mg/dL (H)). Liver Function  Tests: Recent Labs  Lab 09/29/19 0831  AST 20  ALT 15  ALKPHOS 66  BILITOT 0.4  PROT 6.0*  ALBUMIN 2.4*   Recent Labs  Lab 09/29/19 0831  LIPASE 23   No results for input(s): AMMONIA in the last 168 hours. Coagulation Profile: Recent Labs  Lab 09/29/19 0831  INR 1.2   Cardiac Enzymes: No results for input(s): CKTOTAL, CKMB, CKMBINDEX, TROPONINI in the last 168 hours. BNP (last 3 results) No results for input(s): PROBNP in the last 8760 hours. HbA1C: No results for input(s): HGBA1C in the last 72 hours. CBG: Recent Labs  Lab 09/29/19 1710  GLUCAP 84   Lipid Profile: No results for input(s): CHOL, HDL, LDLCALC, TRIG, CHOLHDL, LDLDIRECT in the last 72 hours. Thyroid Function Tests: No results for input(s): TSH, T4TOTAL, FREET4, T3FREE, THYROIDAB in the last 72 hours. Anemia Panel: No results for input(s): VITAMINB12, FOLATE, FERRITIN, TIBC, IRON, RETICCTPCT in the last 72 hours. Urine analysis:    Component Value Date/Time   COLORURINE YELLOW 09/29/2019 0855   APPEARANCEUR HAZY (A) 09/29/2019 0855   LABSPEC 1.009 09/29/2019 0855   PHURINE 6.0 09/29/2019 0855   GLUCOSEU NEGATIVE 09/29/2019 0855   HGBUR NEGATIVE 09/29/2019 0855   BILIRUBINUR NEGATIVE 09/29/2019 0855   KETONESUR NEGATIVE 09/29/2019 0855   PROTEINUR NEGATIVE 09/29/2019 0855   NITRITE NEGATIVE 09/29/2019 0855   LEUKOCYTESUR NEGATIVE 09/29/2019 0855    Radiological Exams on Admission: CT Angio Abd/Pel W and/or Wo Contrast  Result Date: 09/29/2019 CLINICAL DATA:  74 year old with abdominal pain. Evaluate for acute mesenteric ischemia. History of partial hysterectomy. History of right hemicolectomy. EXAM: CT ANGIOGRAPHY ABDOMEN AND PELVIS WITH CONTRAST AND WITHOUT CONTRAST TECHNIQUE: Multidetector CT imaging of the abdomen and pelvis was performed using the standard protocol during bolus administration of intravenous contrast. Multiplanar reconstructed images and MIPs were obtained and reviewed to  evaluate the vascular anatomy. CONTRAST:  143mL OMNIPAQUE IOHEXOL 350 MG/ML SOLN COMPARISON:  CT abdomen and pelvis without contrast 09/16/2019 and CT 06/13/2019 and CTA from 09/13/2005 FINDINGS: VASCULAR Aorta: Extensive wall calcifications involving the abdominal aorta without aortic aneurysm or dissection. Narrowing at the aortic bifurcation. Large lumbar artery in the distal abdominal aorta. Celiac: Celiac trunk is patent. Main branch vessels  are patent. Atherosclerotic calcifications at the origin of the celiac trunk and there appears to be high-grade stenosis at the celiac artery origin. SMA: Large amount of calcified plaque involving the origin and proximal aspect of the SMA. Origin and proximal SMA are occluded. There is reconstitution of the SMA approximately 5 cm from the origin. There appears to be a surgical bypass supplying flow to the SMA which originates from the distal aspect of the right common iliac artery. Main SMA branch vessels beyond the bypass appear to be patent. Renals: Single bilateral renal arteries. Bilateral renal arteries are heavily calcified and suspect high-grade stenosis near the origin of the right renal artery. Due to small size of the renal arteries, is difficult to estimate the degree of stenosis. Probably less than 50% stenosis involving the origin of the left renal artery but there may be additional stenosis in the proximal left renal artery. No evidence for a renal artery aneurysm. IMA: Origin of the IMA is occluded. Evidence for some reconstitution of IMA. Inflow: Extensive calcified plaque at the aortic bifurcation. There appears to be near complete occlusion at the origin of the right common iliac artery. Ectasia of the right common iliac artery measuring up to 1.2 cm may represent post stenotic dilatation. There is a patent surgical bypass between the SMA and the distal right common iliac artery. Calcified plaque at the right iliac artery bifurcation and suspect at least  50% stenosis at the origin of the right external iliac artery. Right external iliac artery is patent. Stenosis at the origin of the right hypogastric artery. Greater than 70% stenosis at the origin of the left common iliac artery with post stenotic dilatation / aneurysm of the left common iliac artery measuring up to 1.9 cm. At least mild stenosis at the origin of the left hypogastric artery. Left external iliac artery is patent without significant stenosis. Proximal Outflow: Calcified plaque involving the bilateral common femoral arteries. Proximal femoral arteries are patent bilaterally. Veins: Contrast refluxes into the hepatic veins suggesting increased right heart pressures. Portal venous system is patent. No gross abnormality to the iliac veins or IVC. Bilateral renal veins are patent. Review of the MIP images confirms the above findings. NON-VASCULAR Lower chest: Lung bases are clear.  No pleural effusions. Hepatobiliary: 6 mm hypodensity at the hepatic dome is likely an incidental finding and probably represents a hepatic cyst. No suspicious liver lesions. Normal appearance of the gallbladder. No biliary dilatation. Pancreas: Poorly defined structure along the caudal aspect of the pancreatic body on sequence 5, image 28 measures 3.0 x 2.2 cm. This was not present in February 2021 but was present on the exam from 09/16/2019. Suspect this represents a postsurgical or postinflammatory structure. No evidence for pancreatic duct dilatation. Spleen: Normal in size without focal abnormality. Adrenals/Urinary Tract: Normal adrenal glands. Normal appearance of the urinary bladder. No suspicious renal lesions. Negative for hydronephrosis. Stomach/Bowel: Normal appearance of the stomach and duodenum. Again noted is diverticulosis involving the proximal sigmoid colon with mild pericolonic edema. Compared to the recent examination, there is increased stranding and trace fluid in the left paracolic gutter. Pericolonic  stranding or edema is best seen on on sequence 4, image 87. Postsurgical changes compatible with right hemicolectomy. No evidence for bowel obstruction. No evidence for pneumatosis. Lymphatic: Small lymph nodes in the upper abdomen. No significant abdominal or pelvic lymphadenopathy. Reproductive: Status post hysterectomy. No adnexal masses. Other: No significant ascites. There is no evidence for free intraperitoneal air. Negative for an abscess  collection. Musculoskeletal: Postsurgical changes along the anterior abdomen compatible with previous abdominal surgery. Laminectomy changes in the lower lumbar spine. Grade 1 anterolisthesis at L4-L5 with vacuum disc. Multilevel vacuum disc. Disc space narrowing at L5-S1. IMPRESSION: VASCULAR 1. Extensive atherosclerotic disease involving the abdominal aorta and visceral arteries. 2. 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 There is a surgical bypass between the distal right common iliac artery and the superior mesenteric artery. This surgical bypass is patent. However, there is a high-grade stenosis or near occlusion at the origin of the right common iliac artery that is supplying the SMA bypass graft. 3. Extensive atherosclerotic disease at the aortic bifurcation. Near occlusion of the right common iliac artery at the origin. Severe stenosis at the origin of the left common iliac artery measuring greater than 70%. Evidence for post stenotic dilatation of the common iliac arteries. 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 4. Probable high-grade stenosis at the origin of the celiac trunk. 5. Bilateral renal artery stenosis. NON-VASCULAR 1. Pericolonic stranding and inflammatory changes centered around the sigmoid colon and left colon. Pericolonic inflammatory changes in the sigmoid colon region have minimally changed from 09/16/2019 at [facility]. A small amount of stranding and trace fluid in the left  paracolic gutter is new since the exam on 09/16/2019. Patient has multiple colonic diverticula and it is possible the findings are related to diverticulitis. However, ischemic colitis is in the differential diagnosis based on the vascular disease and extent of the colonic disease. No evidence for pneumatosis or free air at this time. 2. Indeterminate collection or structure just caudal to the pancreatic body. This structure is new since February of 2021. This could represent a small pancreatic pseudocyst or persistent small postoperative fluid collection. No evidence for acute pancreatic inflammation. Electronically Signed   By: Markus Daft M.D.   On: 09/29/2019 12:15    EKG: Independently reviewed. Sinus rhythm without acute changes  Assessment/Plan Active Problems:   HLD (hyperlipidemia)   Essential hypertension, benign   CAD, NATIVE VESSEL   Abdominal pain   Chronic mesenteric ischemia (HCC)   COPD (chronic obstructive pulmonary disease) (HCC)   Colitis   Nausea & vomiting   Diarrhea   DM type 2 causing vascular disease (HCC)   GERD (gastroesophageal reflux disease)   Hypothyroidism     1. Colitis/diverticulitis.  Noted on CT scan with thickening on the left side.  Patient reports recently being prescribed a course of Augmentin prior to admission. She was evaluated at [facility] for similar complaints in the last 2 weeks. We will start her on ceftriaxone as well as Flagyl.  CT Images were reviewed by EDP with her vascular surgeon who did not feel that this was an ischemia related issue.  Will consult gastroenterology for further assistance. 2. Abdominal pain.  Presumed to be secondary to #1.  Treat supportively. 3. Nausea and vomiting.  Also presumed to be secondary to #1.  Treat supportively with antiemetics.  Will start on clear liquids for now. 4. Chronic mesenteric ischemia.  Per CT imaging, bypass appears to be patent.  Follow-up with vascular surgery as indicated.  Continue on antiplatelet  agents. 5. Hypertension.  Continue on Norvasc and metoprolol.  Blood pressures currently stable 6. Hyperlipidemia.  Continue on statin 7. Type 2 diabetes.  Hold Metformin.  Start on sliding scale 8. Hypothyroidism.  Continue on Synthroid 9. GERD.  Continue on PPI 10. COPD no evidence of shortness  of breath or wheezing at this time.  Continue bronchodilators as needed. 11. Coronary artery disease.  Continue on dual antiplatelet therapy, Imdur, statin, beta-blocker.  No complaints of chest pain at this time.  DVT prophylaxis: Lovenox Code Status: Full code Family Communication: Update Son, Kasandra Knudsen over the phone 6/1 Disposition Plan: Discharge home once abdominal pain has improved Consults called: Gastroenterology Admission status: Inpatient, MedSurg  Kathie Dike MD [facility]   If 7PM-7AM, please contact night-coverage [email or web address]   09/29/2019, 6:10 PM

## 2019-09-29 NOTE — ED Triage Notes (Signed)
Pt c/o of mid and right lower abdominal pain with n/v/d

## 2019-09-30 DIAGNOSIS — K559 Vascular disorder of intestine, unspecified: Secondary | ICD-10-CM

## 2019-09-30 DIAGNOSIS — R197 Diarrhea, unspecified: Secondary | ICD-10-CM

## 2019-09-30 DIAGNOSIS — K529 Noninfective gastroenteritis and colitis, unspecified: Secondary | ICD-10-CM

## 2019-09-30 DIAGNOSIS — T81710A Complication of mesenteric artery following a procedure, not elsewhere classified, initial encounter: Secondary | ICD-10-CM

## 2019-09-30 DIAGNOSIS — R112 Nausea with vomiting, unspecified: Secondary | ICD-10-CM

## 2019-09-30 DIAGNOSIS — K551 Chronic vascular disorders of intestine: Principal | ICD-10-CM

## 2019-09-30 DIAGNOSIS — F17200 Nicotine dependence, unspecified, uncomplicated: Secondary | ICD-10-CM

## 2019-09-30 DIAGNOSIS — I2510 Atherosclerotic heart disease of native coronary artery without angina pectoris: Secondary | ICD-10-CM

## 2019-09-30 DIAGNOSIS — I10 Essential (primary) hypertension: Secondary | ICD-10-CM

## 2019-09-30 DIAGNOSIS — E1159 Type 2 diabetes mellitus with other circulatory complications: Secondary | ICD-10-CM

## 2019-09-30 LAB — CBC
HCT: 26.6 % — ABNORMAL LOW (ref 36.0–46.0)
Hemoglobin: 8 g/dL — ABNORMAL LOW (ref 12.0–15.0)
MCH: 29.7 pg (ref 26.0–34.0)
MCHC: 30.1 g/dL (ref 30.0–36.0)
MCV: 98.9 fL (ref 80.0–100.0)
Platelets: 275 10*3/uL (ref 150–400)
RBC: 2.69 MIL/uL — ABNORMAL LOW (ref 3.87–5.11)
RDW: 14 % (ref 11.5–15.5)
WBC: 4.1 10*3/uL (ref 4.0–10.5)
nRBC: 0 % (ref 0.0–0.2)

## 2019-09-30 LAB — COMPREHENSIVE METABOLIC PANEL
ALT: 12 U/L (ref 0–44)
AST: 13 U/L — ABNORMAL LOW (ref 15–41)
Albumin: 2.2 g/dL — ABNORMAL LOW (ref 3.5–5.0)
Alkaline Phosphatase: 59 U/L (ref 38–126)
Anion gap: 5 (ref 5–15)
BUN: 12 mg/dL (ref 8–23)
CO2: 28 mmol/L (ref 22–32)
Calcium: 8.2 mg/dL — ABNORMAL LOW (ref 8.9–10.3)
Chloride: 106 mmol/L (ref 98–111)
Creatinine, Ser: 0.97 mg/dL (ref 0.44–1.00)
GFR calc Af Amer: >60 mL/min (ref >60)
GFR calc non Af Amer: 57 mL/min — ABNORMAL LOW (ref >60)
Glucose, Bld: 90 mg/dL (ref 70–99)
Potassium: 4.4 mmol/L (ref 3.5–5.1)
Sodium: 139 mmol/L (ref 135–145)
Total Bilirubin: 0.4 mg/dL (ref 0.3–1.2)
Total Protein: 5.4 g/dL — ABNORMAL LOW (ref 6.5–8.1)

## 2019-09-30 LAB — HEMOGLOBIN A1C
Hgb A1c MFr Bld: 6.6 % — ABNORMAL HIGH (ref 4.8–5.6)
Mean Plasma Glucose: 142.72 mg/dL

## 2019-09-30 LAB — GLUCOSE, CAPILLARY
Glucose-Capillary: 81 mg/dL (ref 70–99)
Glucose-Capillary: 86 mg/dL (ref 70–99)
Glucose-Capillary: 90 mg/dL (ref 70–99)
Glucose-Capillary: 89 mg/dL (ref 70–99)

## 2019-09-30 NOTE — Progress Notes (Signed)
PROGRESS NOTE  [name] [id number] DOB: [birth year] DOA: 09/29/2019 PCP: Patient, No Pcp Per  Brief History:   74 year old female with a history of mesenteric ischemia, coronary disease, hypertension, hypothyroidism, diabetes mellitus type 2, depression, hyperlipidemia presenting with 3-week history of abdominal pain that worsened over the past week with associated nausea and vomiting that began on 09/24/2019.  Notably, the patient has a history of critical mesenteric ischemia for which she required right hemicolectomy and right iliac to SMA bypass performed at [facility] on 06/13/2019.  The patient states that she will occasionally still smoke cigarettes but not on a daily basis.  She has been compliant with her medications.  She has had some loose stools intermittently over the past week without hematochezia or melena.  She denied any chest pain, shortness breath, cough, hemoptysis, nausea, vomiting. Notably, the patient had was admitted to [facility] approximately 2 weeks prior to this admission.  She was discharged home with Augmentin.  She stated that she finished Augmentin approximately 3 days prior to this admission.  She stated that her abdominal pain continue to worsen as discussed above.  In the ED, the patient was afebrile and hemodynamically stable.  CT a of the abdomen pelvis was obtained.  Results as discussed below.  EDP spoke with vascular surgery at [facility] who did not feel the patient required transfer or acute vascular intervention at this time.  Assessment/Plan: Ischemic Colitis -09/29/19 CTA abd/pelvis--chronic occlusion @origin  SMA/IMA; patent surgical bypass between AMS and distal R-common iliac; near occlusion right common iliac; severe stenosis left common iliac with a stenotic dilatation; probable high-grade stenosis of the origin the celiac trunk; bilateral RAS; pericolonic stranding and inflammation of the sigmoid and left colon with minimal change since 09/16/2019.   Small amount of stranding and trace fluid in the left paracolic gutter. -EDP spoke with vascular surgery at [facility], Dr. Alroy Dust-- Images were reviewed and it was not felt that patient symptoms were related to an issue with blood flow.  Regardless, it was not felt that any further vascular intervention was indicated/could be offered at this time -continue ceftriaxone and metronidazole -Continue IV fluids -Continue clear liquid diet  Intractable nausea and vomiting -Clear liquid diet for now  -UA negative for pyuria -Lipase 23  Diabetes mellitus type 2 -09/29/2019 hemoglobin A1c 6.6 -Patient is not on any agents in the outpatient setting  Mesenteric ischemia -Continue aspirin and Plavix  Coronary artery disease -No chest pain presently -Continue aspirin and Plavix -Continue metoprolol tartrate  Essential hypertension -Continue amlodipine and metoprolol tartrate  Hyperlipidemia -Continue statin  Hypothyroidism -continue synthroid        Status is: Inpatient  Remains inpatient appropriate because:IV treatments appropriate due to intensity of illness or inability to take PO and Inpatient level of care appropriate due to severity of illness   Dispo: The patient is from: Home              Anticipated d/c is to: Home              Anticipated d/c date is: 2 days              Patient currently is not medically stable to d/c.         Family Communication: no  Family at bedside  Consultants:  none  Code Status:  FULL   DVT Prophylaxis:  [place] Lovenox   Procedures: As Listed in Progress Note Above  Antibiotics: Ceftriaxone  6/1>>> Metronidazole 6/1>>>     Subjective: Patient states that she still has abdominal pain but it is improving.  She has some nausea without emesis patient denies any fevers, chills, headache, chest pain, shortness breath, cough no hemoptysis, diarrhea, hematochezia, melena.  There is no dysuria or hematuria.  Objective: Vitals:     09/29/19 2133 09/30/19 0112 09/30/19 0457 09/30/19 0752  BP: (!) 133/40 (!) 118/43 (!) 93/47   Pulse: 71 (!) 59 60   Resp: 18 18 18    Temp:  97.7 F (36.5 C) 98.1 F (36.7 C)   TempSrc:  Oral Oral   SpO2: 95% 94% 95% 97%  Weight:      Height:        Intake/Output Summary (Last 24 hours) at 09/30/2019 0933 Last data filed at 09/30/2019 0400 Gross per 24 hour  Intake 967.99 ml  Output 1300 ml  Net -332.01 ml   Weight change:  Exam:   General:  Pt is alert, follows commands appropriately, not in acute distress  HEENT: No icterus, No thrush, No neck mass, [place]/AT  Cardiovascular: RRR, S1/S2, no rubs, no gallops  Respiratory: CTA bilaterally, no wheezing, no crackles, no rhonchi  Abdomen: Soft/+BS, LLq tender, non distended, no guarding  Extremities: No edema, No lymphangitis, No petechiae, No rashes, no synovitis   Data Reviewed: I have personally reviewed following labs and imaging studies Basic Metabolic Panel: Recent Labs  Lab 09/29/19 0831 09/30/19 0418  NA 137 139  K 4.4 4.4  CL 103 106  CO2 26 28  GLUCOSE 142* 90  BUN 17 12  CREATININE 1.03* 0.97  CALCIUM 8.4* 8.2*   Liver Function Tests: Recent Labs  Lab 09/29/19 0831 09/30/19 0418  AST 20 13*  ALT 15 12  ALKPHOS 66 59  BILITOT 0.4 0.4  PROT 6.0* 5.4*  ALBUMIN 2.4* 2.2*   Recent Labs  Lab 09/29/19 0831  LIPASE 23   No results for input(s): AMMONIA in the last 168 hours. Coagulation Profile: Recent Labs  Lab 09/29/19 0831  INR 1.2   CBC: Recent Labs  Lab 09/29/19 0831 09/30/19 0418  WBC 8.3 4.1  NEUTROABS 6.5  --   HGB 9.0* 8.0*  HCT 29.1* 26.6*  MCV 99.3 98.9  PLT 301 275   Cardiac Enzymes: No results for input(s): CKTOTAL, CKMB, CKMBINDEX, TROPONINI in the last 168 hours. BNP: Invalid input(s): POCBNP CBG: Recent Labs  Lab 09/29/19 1710 09/29/19 2133 09/30/19 0752  GLUCAP 84 92 81   HbA1C: Recent Labs    09/29/19 0831  HGBA1C 6.6*   Urine analysis:     Component Value Date/Time   COLORURINE YELLOW 09/29/2019 0855   APPEARANCEUR HAZY (A) 09/29/2019 0855   LABSPEC 1.009 09/29/2019 0855   PHURINE 6.0 09/29/2019 0855   GLUCOSEU NEGATIVE 09/29/2019 0855   HGBUR NEGATIVE 09/29/2019 0855   BILIRUBINUR NEGATIVE 09/29/2019 0855   KETONESUR NEGATIVE 09/29/2019 0855   PROTEINUR NEGATIVE 09/29/2019 0855   NITRITE NEGATIVE 09/29/2019 0855   LEUKOCYTESUR NEGATIVE 09/29/2019 0855   Sepsis Labs: @LABRCNTIP (procalcitonin:4,lacticidven:4) ) Recent Results (from the past 240 hour(s))  SARS Coronavirus 2 by RT PCR (hospital order, performed in [place] hospital lab) Nasopharyngeal Nasopharyngeal Swab     Status: None   Collection Time: 09/29/19  1:13 PM   Specimen: Nasopharyngeal Swab  Result Value Ref Range Status   SARS Coronavirus 2 NEGATIVE NEGATIVE Final    Comment: (NOTE) SARS-CoV-2 target nucleic acids are NOT DETECTED. The SARS-CoV-2 RNA is generally detectable in  upper and lower respiratory specimens during the acute phase of infection. The lowest concentration of SARS-CoV-2 viral copies this assay can detect is 250 copies / mL. A negative result does not preclude SARS-CoV-2 infection and should not be used as the sole basis for treatment or other patient management decisions.  A negative result may occur with improper specimen collection / handling, submission of specimen other than nasopharyngeal swab, presence of viral mutation(s) within the areas targeted by this assay, and inadequate number of viral copies (<250 copies / mL). A negative result must be combined with clinical observations, patient history, and epidemiological information. Fact Sheet for Patients:   StrictlyIdeas.no Fact Sheet for Healthcare Providers: BankingDealers.co.za This test is not yet approved or cleared  by the [place] FDA and has been authorized for detection and/or diagnosis of SARS-CoV-2 by FDA  under an Emergency Use Authorization (EUA).  This EUA will remain in effect (meaning this test can be used) for the duration of the COVID-19 declaration under Section 564(b)(1) of the Act, 21 U.S.C. section 360bbb-3(b)(1), unless the authorization is terminated or revoked sooner. Performed at [facility], [address] [place], [place] [address]      Scheduled Meds: . amLODipine  5 mg Oral Daily  . aspirin EC  81 mg Oral Daily  . buPROPion  150 mg Oral Daily  . clopidogrel  75 mg Oral Daily  . enoxaparin (LOVENOX) injection  50 mg Subcutaneous Q24H  . gabapentin  300 mg Oral BID  . insulin aspart  0-15 Units Subcutaneous TID WC  . insulin aspart  0-5 Units Subcutaneous QHS  . isosorbide mononitrate  60 mg Oral Daily  . levothyroxine  25 mcg Oral Q0600  . metoprolol tartrate  12.5 mg Oral BID  . pantoprazole  40 mg Oral Daily  . pravastatin  80 mg Oral Daily   Continuous Infusions: . sodium chloride 75 mL/hr at 09/29/19 1844  . cefTRIAXone (ROCEPHIN)  IV    . metronidazole 500 mg (09/30/19 0528)    Procedures/Studies: CT Angio Abd/Pel W and/or Wo Contrast  Result Date: 09/29/2019 CLINICAL DATA:  74 year old with abdominal pain. Evaluate for acute mesenteric ischemia. History of partial hysterectomy. History of right hemicolectomy. EXAM: CT ANGIOGRAPHY ABDOMEN AND PELVIS WITH CONTRAST AND WITHOUT CONTRAST TECHNIQUE: Multidetector CT imaging of the abdomen and pelvis was performed using the standard protocol during bolus administration of intravenous contrast. Multiplanar reconstructed images and MIPs were obtained and reviewed to evaluate the vascular anatomy. CONTRAST:  159mL OMNIPAQUE IOHEXOL 350 MG/ML SOLN COMPARISON:  CT abdomen and pelvis without contrast 09/16/2019 and CT 06/13/2019 and CTA from 09/13/2005 FINDINGS: VASCULAR Aorta: Extensive wall calcifications involving the abdominal aorta without aortic aneurysm or dissection. Narrowing at the aortic bifurcation. Large lumbar  artery in the distal abdominal aorta. Celiac: Celiac trunk is patent. Main branch vessels are patent. Atherosclerotic calcifications at the origin of the celiac trunk and there appears to be high-grade stenosis at the celiac artery origin. SMA: Large amount of calcified plaque involving the origin and proximal aspect of the SMA. Origin and proximal SMA are occluded. There is reconstitution of the SMA approximately 5 cm from the origin. There appears to be a surgical bypass supplying flow to the SMA which originates from the distal aspect of the right common iliac artery. Main SMA branch vessels beyond the bypass appear to be patent. Renals: Single bilateral renal arteries. Bilateral renal arteries are heavily calcified and suspect high-grade stenosis near the origin of the right renal  artery. Due to small size of the renal arteries, is difficult to estimate the degree of stenosis. Probably less than 50% stenosis involving the origin of the left renal artery but there may be additional stenosis in the proximal left renal artery. No evidence for a renal artery aneurysm. IMA: Origin of the IMA is occluded. Evidence for some reconstitution of IMA. Inflow: Extensive calcified plaque at the aortic bifurcation. There appears to be near complete occlusion at the origin of the right common iliac artery. Ectasia of the right common iliac artery measuring up to 1.2 cm may represent post stenotic dilatation. There is a patent surgical bypass between the SMA and the distal right common iliac artery. Calcified plaque at the right iliac artery bifurcation and suspect at least 50% stenosis at the origin of the right external iliac artery. Right external iliac artery is patent. Stenosis at the origin of the right hypogastric artery. Greater than 70% stenosis at the origin of the left common iliac artery with post stenotic dilatation / aneurysm of the left common iliac artery measuring up to 1.9 cm. At least mild stenosis at the  origin of the left hypogastric artery. Left external iliac artery is patent without significant stenosis. Proximal Outflow: Calcified plaque involving the bilateral common femoral arteries. Proximal femoral arteries are patent bilaterally. Veins: Contrast refluxes into the hepatic veins suggesting increased right heart pressures. Portal venous system is patent. No gross abnormality to the iliac veins or IVC. Bilateral renal veins are patent. Review of the MIP images confirms the above findings. NON-VASCULAR Lower chest: Lung bases are clear.  No pleural effusions. Hepatobiliary: 6 mm hypodensity at the hepatic dome is likely an incidental finding and probably represents a hepatic cyst. No suspicious liver lesions. Normal appearance of the gallbladder. No biliary dilatation. Pancreas: Poorly defined structure along the caudal aspect of the pancreatic body on sequence 5, image 28 measures 3.0 x 2.2 cm. This was not present in February 2021 but was present on the exam from 09/16/2019. Suspect this represents a postsurgical or postinflammatory structure. No evidence for pancreatic duct dilatation. Spleen: Normal in size without focal abnormality. Adrenals/Urinary Tract: Normal adrenal glands. Normal appearance of the urinary bladder. No suspicious renal lesions. Negative for hydronephrosis. Stomach/Bowel: Normal appearance of the stomach and duodenum. Again noted is diverticulosis involving the proximal sigmoid colon with mild pericolonic edema. Compared to the recent examination, there is increased stranding and trace fluid in the left paracolic gutter. Pericolonic stranding or edema is best seen on on sequence 4, image 87. Postsurgical changes compatible with right hemicolectomy. No evidence for bowel obstruction. No evidence for pneumatosis. Lymphatic: Small lymph nodes in the upper abdomen. No significant abdominal or pelvic lymphadenopathy. Reproductive: Status post hysterectomy. No adnexal masses. Other: No  significant ascites. There is no evidence for free intraperitoneal air. Negative for an abscess collection. Musculoskeletal: Postsurgical changes along the anterior abdomen compatible with previous abdominal surgery. Laminectomy changes in the lower lumbar spine. Grade 1 anterolisthesis at L4-L5 with vacuum disc. Multilevel vacuum disc. Disc space narrowing at L5-S1. IMPRESSION: VASCULAR 1. Extensive atherosclerotic disease involving the abdominal aorta and visceral arteries. 2. 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 There is a surgical bypass between the distal right common iliac artery and the superior mesenteric artery. This surgical bypass is patent. However, there is a high-grade stenosis or near occlusion at the origin of the right common iliac artery that is supplying the SMA bypass graft. 3. Extensive atherosclerotic disease  at the aortic bifurcation. Near occlusion of the right common iliac artery at the origin. Severe stenosis at the origin of the left common iliac artery measuring greater than 70%. Evidence for post stenotic dilatation of the common iliac arteries. 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 4. Probable high-grade stenosis at the origin of the celiac trunk. 5. Bilateral renal artery stenosis. NON-VASCULAR 1. Pericolonic stranding and inflammatory changes centered around the sigmoid colon and left colon. Pericolonic inflammatory changes in the sigmoid colon region have minimally changed from 09/16/2019 at [facility]. A small amount of stranding and trace fluid in the left paracolic gutter is new since the exam on 09/16/2019. Patient has multiple colonic diverticula and it is possible the findings are related to diverticulitis. However, ischemic colitis is in the differential diagnosis based on the vascular disease and extent of the colonic disease. No evidence for pneumatosis or free air at this time. 2. Indeterminate  collection or structure just caudal to the pancreatic body. This structure is new since February of 2021. This could represent a small pancreatic pseudocyst or persistent small postoperative fluid collection. No evidence for acute pancreatic inflammation. Electronically Signed   By: Markus Daft M.D.   On: 09/29/2019 12:15    Orson Eva, DO  [facility]  If 7PM-7AM, please contact night-coverage [email or web address] Password [facility] 09/30/2019, 9:33 AM   LOS: 1 day

## 2019-09-30 NOTE — Consult Note (Signed)
GI Inpatient Consult Note  History of Present Illness: [name] is a 74 y.o. female seen for evaluation of abd pain, diarrhea at the request of Dr Tat.   Patient reports having diffuse abdominal pain, nausea, diarrhea over a 3-week course.  She reports initially symptoms started as a rumbling with diffuse discomfort, does report abdominal pain became worse on the right side.  Symptoms worsened to include sharp pain and vomiting in addition to the diarrhea which prompted her to be seen at [facility], she was treated with oral Augmentin for diverticulitis visualized on CT.  She returned home but continued to worsen with vomiting daily and having 3-4 loose urgent stools a day.  She was unable to tolerate her regular diet at home.  She denies any obvious blood in stools.  Did not have any improvement over 1 week then presented to [facility] yesterday given lack of improvement after 1 week course of augmentin.    She reports over the past 24 hours since admission having improvement in her abdominal pain, reports she has not had a bowel movement yet since admission. She tolerated most of her clear liquid diet tray this AM.   She reports she stopped smoking last fall.  No alcohol.  She denies NSAID use.  Chronically she denies any weight loss with the symptoms.  Does note frequent belching.   Also had a lot of stress-sister passed last week.     Colonoscopy 2013-diverticulosis, 2 small polyp splenic flexure.  EGD 2013-with gastric erosions of doubtful clinical significance, otherwise normal Path with tubular adenoma, chronic active gastritis, negative H. pylori   Past Medical History:  Past Medical History:  Diagnosis Date  . Acute ischemic colitis ([place]) 09/11/2005  . Anxiety   . Arthritis   . Atherosclerotic vascular disease    Calcified plaque at the origin of the celiac and SMA  . CHF (congestive heart failure) ([place])   . Coronary atherosclerosis of native coronary artery    Mulitvessel LVEF 50-50%, DES circ 1/11, occluded SVG to OM and SVG to diagonal , LVEF 60%  . Depression   . Diverticulosis of colon   . Essential hypertension   . Hypothyroidism   . Mesenteric ischemia ([place])   . Mitral regurgitation    Mild to moderate  . Mixed hyperlipidemia   . Myocardial infarction ([place]) 1990  . Obesity   . Orthostatic hypotension   . PAD (peripheral artery disease) ([place])   . PVC's (premature ventricular contractions)   . Schizophrenia ([place])   . Sleep apnea    CPAP  . Tubular adenoma   . Type 2 diabetes mellitus ([place])     Problem List: Patient Active Problem List   Diagnosis Date Noted  . Colitis 09/29/2019  . Nausea & vomiting 09/29/2019  . Diarrhea 09/29/2019  . DM type 2 causing vascular disease ([place]) 09/29/2019  . GERD (gastroesophageal reflux disease) 09/29/2019  . Hypothyroidism 09/29/2019  . COPD (chronic obstructive pulmonary disease) ([place]) 09/27/2017  . OSA (obstructive sleep apnea) 09/24/2017  . MDD (major depressive disorder), recurrent episode, moderate ([place]) 11/05/2016  . Chronic mesenteric ischemia ([place]) 08/12/2012  . Chronic diarrhea 03/18/2012  . Abdominal pain 03/18/2012  . Unintentional weight loss 03/18/2012  . Carotid bruit 01/03/2012  . Palpitations 06/04/2011  . Depression 06/04/2011  . SINUS BRADYCARDIA 10/13/2009  . ORTHOSTATIC HYPOTENSION 10/04/2009  . Dyspnea on exertion 05/06/2009  . MITRAL REGURGITATION 03/18/2009  . TOBACCO USER 02/11/2009  . HLD (hyperlipidemia) 01/19/2009  .  Essential hypertension, benign 01/19/2009  . CAD, NATIVE VESSEL 01/19/2009    Past Surgical History: Past Surgical History:  Procedure Laterality Date  . BACK SURGERY     Lumbar spine surgery  . Carotid endarectomy Bilateral   . CARPAL TUNNEL RELEASE     Bilateral  . CATARACT EXTRACTION W/PHACO Right 08/24/2013   Procedure: CATARACT EXTRACTION PHACO AND INTRAOCULAR LENS PLACEMENT (IOC);  Surgeon: Tonny Branch, MD;  Location: [facility];  Service:  Ophthalmology;  Laterality: Right;  CDE 8.68  . COLONOSCOPY  09/14/2005   Dr. Leonard Schwartz colitis  . COLONOSCOPY WITH ESOPHAGOGASTRODUODENOSCOPY (EGD)  04/14/2012   Dr. Gala Romney- EGD= gastric erosions of doubtful clinical significance per bx- chronic inactive gastritis, benign small bowel mucosa. TCS=colonic diverticulosis, tubular adenoma  . CORONARY ARTERY BYPASS GRAFT     LIMA-LAD; SVG-OM; SVG-DX in 1995 [place]  . NECK SURGERY     Cervical laminectomy  . PARTIAL HYSTERECTOMY    . stents x4      Allergies: No Known Allergies  Home Medications: Medications Prior to Admission  Medication Sig Dispense Refill Last Dose  . aspirin EC 81 MG tablet Take 81 mg by mouth daily.   09/28/2019 at 0900  . buPROPion (WELLBUTRIN XL) 150 MG 24 hr tablet Take 150 mg by mouth in the morning and at bedtime.   09/28/2019 at Unknown time  . clopidogrel (PLAVIX) 75 MG tablet Take 75 mg by mouth daily.     09/28/2019 at 0900  . isosorbide mononitrate (IMDUR) 60 MG 24 hr tablet Take 60 mg by mouth daily.    09/28/2019 at Unknown time  . metoprolol tartrate (LOPRESSOR) 25 MG tablet Take 0.5 tablets (12.5 mg total) by mouth 2 (two) times daily. 90 tablet 3 09/28/2019 at 2100  . nitroGLYCERIN (NITROSTAT) 0.4 MG SL tablet Place 1 tablet (0.4 mg total) under the tongue every 5 (five) minutes as needed. 90 tablet 3   . pantoprazole (PROTONIX) 40 MG tablet Take 40 mg by mouth daily.   09/28/2019 at Unknown time  . pravastatin (PRAVACHOL) 80 MG tablet Take 80 mg by mouth daily.     09/28/2019 at Unknown time  . SPIRIVA HANDIHALER 18 MCG inhalation capsule 1 capsule daily.   09/28/2019 at Unknown time  . Tiotropium Bromide-Olodaterol (STIOLTO RESPIMAT) 2.5-2.5 MCG/ACT AERS Inhale 2 puffs into the lungs daily. 1 Inhaler 0 09/28/2019 at Unknown time  . torsemide (DEMADEX) 20 MG tablet Take 2 tablets (40 mg total) by mouth as needed. 30 tablet 0 09/28/2019 at Unknown time  . zolpidem (AMBIEN) 10 MG tablet Take 10 mg by mouth at  bedtime as needed for sleep.   09/28/2019 at Unknown time  . amLODipine (NORVASC) 5 MG tablet Take 5 mg by mouth daily.   Not Taking at Unknown time  . gabapentin (NEURONTIN) 300 MG capsule Take 300 mg by mouth 2 (two) times daily.   Not Taking at Unknown time  . levothyroxine (SYNTHROID) 25 MCG tablet Take 1 tablet by mouth daily.   Not Taking at Unknown time  . metFORMIN (GLUCOPHAGE) 500 MG tablet Take 500 mg by mouth 2 (two) times daily.   Not Taking at Unknown time  . oxyCODONE-acetaminophen (PERCOCET/ROXICET) 5-325 MG tablet Take 1 tablet by mouth every 4 (four) hours as needed for severe pain.   Not Taking at Unknown time  . polyethylene glycol (MIRALAX / GLYCOLAX) 17 g packet Take 17 g by mouth daily.   Not Taking at Unknown time   Home medication reconciliation  was completed with the patient.   Scheduled Inpatient Medications:   . amLODipine  5 mg Oral Daily  . aspirin EC  81 mg Oral Daily  . buPROPion  150 mg Oral Daily  . clopidogrel  75 mg Oral Daily  . enoxaparin (LOVENOX) injection  50 mg Subcutaneous Q24H  . gabapentin  300 mg Oral BID  . insulin aspart  0-15 Units Subcutaneous TID WC  . insulin aspart  0-5 Units Subcutaneous QHS  . isosorbide mononitrate  60 mg Oral Daily  . levothyroxine  25 mcg Oral Q0600  . metoprolol tartrate  12.5 mg Oral BID  . pantoprazole  40 mg Oral Daily  . pravastatin  80 mg Oral Daily    Continuous Inpatient Infusions:   . sodium chloride 75 mL/hr at 09/29/19 1844  . cefTRIAXone (ROCEPHIN)  IV    . metronidazole 500 mg (09/30/19 0528)    PRN Inpatient Medications:  acetaminophen **OR** acetaminophen, albuterol, morphine injection, nitroGLYCERIN, ondansetron **OR** ondansetron (ZOFRAN) IV, oxyCODONE-acetaminophen, zolpidem  Family History: family history includes Alcohol abuse in her father and mother; Coronary artery disease in an other family member; Crohn's disease (age of onset: 71) in her sister; Hypertension in an other family  member; Stroke in her daughter.    Social History:   reports that she has quit smoking. Her smoking use included cigarettes. She started smoking about 9 months ago. She has a 25.00 pack-year smoking history. She has never used smokeless tobacco. She reports that she does not drink alcohol or use drugs.   Review of Systems: Constitutional: Weight is stable.  Eyes: No changes in vision. ENT: No oral lesions, sore throat.  GI: see HPI.  Heme/Lymph: No easy bruising.  CV: No chest pain.  GU: No hematuria.  Integumentary: No rashes.  Neuro: No headaches.  Psych: No depression/anxiety.  Endocrine: No heat/cold intolerance.  Allergic/Immunologic: No urticaria.  Resp: No cough, SOB.  Musculoskeletal: No joint swelling.    Physical Examination: BP (!) 93/47 (BP Location: Left Arm)   Pulse 60   Temp 98.1 F (36.7 C) (Oral)   Resp 18   Ht 5\' 3"  (1.6 m)   Wt 106 kg   SpO2 97%   BMI 41.40 kg/m  Gen: NAD, alert and oriented x 4 HEENT: PEERLA, EOMI, Neck: supple, no JVD or thyromegaly Chest: CTA bilaterally, no wheezes, crackles, or other adventitious sounds CV: RRR, no m/g/c/r Abd: soft, NT, ND, +BS in all four quadrants; no HSM, guarding, ridigity, or rebound tenderness Ext: no edema, well perfused with 2+ pulses, Skin: no rash or lesions noted Lymph: no LAD  Data:  Normal lactic acid on admission  Lab Results  Component Value Date   WBC 4.1 09/30/2019   HGB 8.0 (L) 09/30/2019   HCT 26.6 (L) 09/30/2019   MCV 98.9 09/30/2019   PLT 275 09/30/2019   Recent Labs  Lab 09/29/19 0831 09/30/19 0418  HGB 9.0* 8.0*   Lab Results  Component Value Date   NA 139 09/30/2019   K 4.4 09/30/2019   CL 106 09/30/2019   CO2 28 09/30/2019   BUN 12 09/30/2019   CREATININE 0.97 09/30/2019   Lab Results  Component Value Date   ALT 12 09/30/2019   AST 13 (L) 09/30/2019   ALKPHOS 59 09/30/2019   BILITOT 0.4 09/30/2019   Recent Labs  Lab 09/29/19 0831  INR 1.2   IMPRESSION:  CT ANGIO 09/30/19 VASCULAR 1. Extensive atherosclerotic disease involving the abdominal aorta and visceral  arteries. 2. 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 There is a surgical bypass between the distal right common iliac artery and the superior mesenteric artery. This surgical bypass is patent. However, there is a high-grade stenosis or near occlusion at the origin of the right common iliac artery that is supplying the SMA bypass graft. 3. Extensive atherosclerotic disease at the aortic bifurcation. Near occlusion of the right common iliac artery at the origin. Severe stenosis at the origin of the left common iliac artery measuring greater than 70%. Evidence for post stenotic dilatation of the common iliac arteries. 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 4. Probable high-grade stenosis at the origin of the celiac trunk. 5. Bilateral renal artery stenosis.  NON-VASCULAR 1. Pericolonic stranding and inflammatory changes centered around the sigmoid colon and left colon. Pericolonic inflammatory changes in the sigmoid colon region have minimally changed from 09/16/2019 at [facility]. A small amount of stranding and trace fluid in the left paracolic gutter is new since the exam on 09/16/2019. Patient has multiple colonic diverticula and it is possible the findings are related to diverticulitis. However, ischemic colitis is in the differential diagnosis based on the vascular disease and extent of the colonic disease. No evidence for pneumatosis or free air at this time. 2. Indeterminate collection or structure just caudal to the pancreatic body. This structure is new since February of 2021. This could represent a small pancreatic pseudocyst or persistent small postoperative fluid collection. No evidence for acute pancreatic inflammation.   Assessment/Plan: Ms. Comins is a 74 y.o. female with extensive vascular  history of acute mesenteric ischemia status post right iliac to SMA bypass and right hemicolectomy in February 2021.  Now presenting with nausea, abdominal pain, diarrhea-need to exclude infection with stool studies which were ordered. Clinically she denies significantly worsening postprandial abdominal pain, fear of eating, and weight loss suggestive of mesenteric issues.  Abdominal exam this morning seems improved compared to yesterday and will continue Flagyl and Rocephin.  We will advance her diet to full liquid.  Case discussed with Dr. Laural Golden  Further recommendations pending clinical course   Case was discussed with Dr. Laural Golden. Thank you for the consult. Please call with questions or concerns.  Laurine Blazer, PA-C [facility]

## 2019-10-01 DIAGNOSIS — R197 Diarrhea, unspecified: Secondary | ICD-10-CM

## 2019-10-01 DIAGNOSIS — R1084 Generalized abdominal pain: Secondary | ICD-10-CM

## 2019-10-01 DIAGNOSIS — R933 Abnormal findings on diagnostic imaging of other parts of digestive tract: Secondary | ICD-10-CM

## 2019-10-01 LAB — MAGNESIUM: Magnesium: 1.8 mg/dL (ref 1.7–2.4)

## 2019-10-01 LAB — GLUCOSE, CAPILLARY
Glucose-Capillary: 109 mg/dL — ABNORMAL HIGH (ref 70–99)
Glucose-Capillary: 92 mg/dL (ref 70–99)
Glucose-Capillary: 95 mg/dL (ref 70–99)
Glucose-Capillary: 113 mg/dL — ABNORMAL HIGH (ref 70–99)

## 2019-10-01 LAB — CBC
HCT: 26.6 % — ABNORMAL LOW (ref 36.0–46.0)
Hemoglobin: 8 g/dL — ABNORMAL LOW (ref 12.0–15.0)
MCH: 30.1 pg (ref 26.0–34.0)
MCHC: 30.1 g/dL (ref 30.0–36.0)
MCV: 100 fL (ref 80.0–100.0)
Platelets: 243 10*3/uL (ref 150–400)
RBC: 2.66 MIL/uL — ABNORMAL LOW (ref 3.87–5.11)
RDW: 14.4 % (ref 11.5–15.5)
WBC: 3.3 10*3/uL — ABNORMAL LOW (ref 4.0–10.5)
nRBC: 0 % (ref 0.0–0.2)

## 2019-10-01 LAB — BASIC METABOLIC PANEL
Anion gap: 6 (ref 5–15)
BUN: 12 mg/dL (ref 8–23)
CO2: 26 mmol/L (ref 22–32)
Calcium: 8.1 mg/dL — ABNORMAL LOW (ref 8.9–10.3)
Chloride: 106 mmol/L (ref 98–111)
Creatinine, Ser: 0.92 mg/dL (ref 0.44–1.00)
GFR calc Af Amer: >60 mL/min (ref >60)
GFR calc non Af Amer: >60 mL/min (ref >60)
Glucose, Bld: 121 mg/dL — ABNORMAL HIGH (ref 70–99)
Potassium: 4.2 mmol/L (ref 3.5–5.1)
Sodium: 138 mmol/L (ref 135–145)

## 2019-10-01 LAB — C DIFFICILE QUICK SCREEN W PCR REFLEX
C Diff antigen: NEGATIVE
C Diff interpretation: NOT DETECTED
C Diff toxin: NEGATIVE

## 2019-10-01 MED ORDER — ALUM & MAG HYDROXIDE-SIMETH 200-200-20 MG/5ML PO SUSP
30.0000 mL | ORAL | Status: DC | PRN
  Administered 2019-10-01: 30 mL via ORAL
  Filled 2019-10-01: qty 30

## 2019-10-01 MED ORDER — AMLODIPINE BESYLATE 5 MG PO TABS
5.0000 mg | ORAL_TABLET | Freq: Every day | ORAL | Status: DC
  Administered 2019-10-02: 5 mg via ORAL
  Filled 2019-10-01: qty 1

## 2019-10-01 NOTE — Progress Notes (Signed)
PROGRESS NOTE  [name] [id number] DOB: January 11, 1946 DOA: 09/29/2019 PCP: Patient, No Pcp Per   Brief History:   74 year old female with a history of mesenteric ischemia, coronary disease, hypertension, hypothyroidism, diabetes mellitus type 2, depression, hyperlipidemia presenting with 3-week history of abdominal pain that worsened over the past week with associated nausea and vomiting that began on 09/24/2019.  Notably, the patient has a history of critical mesenteric ischemia for which she required right hemicolectomy and right iliac to SMA bypass performed at [facility] on 06/13/2019.  The patient states that she will occasionally still smoke cigarettes but not on a daily basis.  She has been compliant with her medications.  She has had some loose stools intermittently over the past week without hematochezia or melena.  She denied any chest pain, shortness breath, cough, hemoptysis, nausea, vomiting. Notably, the patient had was admitted to [facility] approximately 2 weeks prior to this admission.  She was discharged home with Augmentin.  She stated that she finished Augmentin approximately 3 days prior to this admission.  She stated that her abdominal pain continue to worsen as discussed above.  In the ED, the patient was afebrile and hemodynamically stable.  CT a of the abdomen pelvis was obtained.  Results as discussed below.  EDP spoke with vascular surgery at [facility] who did not feel the patient required transfer or acute vascular intervention at this time.  Assessment/Plan: Ischemic Colitis -09/29/19 CTA abd/pelvis--chronic occlusion @origin  SMA/IMA; patent surgical bypass between AMS and distal R-common iliac; near occlusion right common iliac; severe stenosis left common iliac with a stenotic dilatation; probable high-grade stenosis of the origin the celiac trunk; bilateral RAS; pericolonic stranding and inflammation of the sigmoid and left colon with minimal change since 09/16/2019.   Small amount of stranding and trace fluid in the left paracolic gutter. -EDP spoke with vascular surgery at [facility], Dr. Orson Slick were reviewed and it was not felt that patient symptoms were related to an issue with blood flow. Regardless, it was not felt that any further vascular intervention was indicated/could be offered at this time -continue ceftriaxone and metronidazole -Continue IV fluids -Continue clear liquid diet>>>advanced to soft diet -appreciate GI followup  Intractable nausea and vomiting -Clear liquid diet for now >>>soft diet as n/v improving -UA negative for pyuria -Lipase 23  Diabetes mellitus type 2 -09/29/2019 hemoglobin A1c 6.6 -Patient is not on any agents in the outpatient setting  Mesenteric ischemia -Continue aspirin and Plavix  Coronary artery disease -No chest pain presently -Continue aspirin and Plavix -Continue metoprolol tartrate  Essential hypertension -Continue amlodipine -hold metoprolol due to soft BP   Hyperlipidemia -Continue statin  Hypothyroidism -continue synthroid        Status is: Inpatient  Remains inpatient appropriate because:IV treatments appropriate due to intensity of illness or inability to take PO and Inpatient level of care appropriate due to severity of illness   Dispo: The patient is from: Home  Anticipated d/c is to: Home  Anticipated d/c date is: 2 days  Patient currently is not medically stable to d/c.         Family Communication: daughter updated 6/3  Consultants:  none  Code Status:  FULL   DVT Prophylaxis:  [place] Lovenox   Procedures: As Listed in Progress Note Above  Antibiotics: Ceftriaxone 6/1>>> Metronidazole 6/1>>>   Subjective: abd pain is improving.  Her diet was advanced which she is tolerating.  Denies f/c, cp, sob, n/v/d  Objective: Vitals:   09/30/19 2326 10/01/19 0537 10/01/19 0900 10/01/19  1349  BP:  (!) 100/31 (!) 96/52 (!) 136/45  Pulse:  (!) 57 62 62  Resp:    20  Temp:  98 F (36.7 C) 98.4 F (36.9 C) 98 F (36.7 C)  TempSrc:  Oral Oral   SpO2: 99% 93% 98% 97%  Weight:      Height:        Intake/Output Summary (Last 24 hours) at 10/01/2019 1748 Last data filed at 10/01/2019 1648 Gross per 24 hour  Intake --  Output 1200 ml  Net -1200 ml   Weight change:  Exam:   General:  Pt is alert, follows commands appropriately, not in acute distress  HEENT: No icterus, No thrush, No neck mass, Organ/AT  Cardiovascular: RRR, S1/S2, no rubs, no gallops  Respiratory: CTA bilaterally, no wheezing, no crackles, no rhonchi  Abdomen: Soft/+BS, non tender, non distended, no guarding  Extremities: No edema, No lymphangitis, No petechiae, No rashes, no synovitis   Data Reviewed: I have personally reviewed following labs and imaging studies Basic Metabolic Panel: Recent Labs  Lab 09/29/19 0831 09/30/19 0418 10/01/19 0510  NA 137 139 138  K 4.4 4.4 4.2  CL 103 106 106  CO2 26 28 26   GLUCOSE 142* 90 121*  BUN 17 12 12   CREATININE 1.03* 0.97 0.92  CALCIUM 8.4* 8.2* 8.1*  MG  --   --  1.8   Liver Function Tests: Recent Labs  Lab 09/29/19 0831 09/30/19 0418  AST 20 13*  ALT 15 12  ALKPHOS 66 59  BILITOT 0.4 0.4  PROT 6.0* 5.4*  ALBUMIN 2.4* 2.2*   Recent Labs  Lab 09/29/19 0831  LIPASE 23   No results for input(s): AMMONIA in the last 168 hours. Coagulation Profile: Recent Labs  Lab 09/29/19 0831  INR 1.2   CBC: Recent Labs  Lab 09/29/19 0831 09/30/19 0418 10/01/19 0510  WBC 8.3 4.1 3.3*  NEUTROABS 6.5  --   --   HGB 9.0* 8.0* 8.0*  HCT 29.1* 26.6* 26.6*  MCV 99.3 98.9 100.0  PLT 301 275 243   Cardiac Enzymes: No results for input(s): CKTOTAL, CKMB, CKMBINDEX, TROPONINI in the last 168 hours. BNP: Invalid input(s): POCBNP CBG: Recent Labs  Lab 09/30/19 1643 09/30/19 2158 10/01/19 0749 10/01/19 1124 10/01/19 1641  GLUCAP 90 89  109* 92 95   HbA1C: Recent Labs    09/29/19 0831  HGBA1C 6.6*   Urine analysis:    Component Value Date/Time   COLORURINE YELLOW 09/29/2019 0855   APPEARANCEUR HAZY (A) 09/29/2019 0855   LABSPEC 1.009 09/29/2019 0855   PHURINE 6.0 09/29/2019 0855   GLUCOSEU NEGATIVE 09/29/2019 0855   HGBUR NEGATIVE 09/29/2019 0855   BILIRUBINUR NEGATIVE 09/29/2019 0855   KETONESUR NEGATIVE 09/29/2019 0855   PROTEINUR NEGATIVE 09/29/2019 0855   NITRITE NEGATIVE 09/29/2019 0855   LEUKOCYTESUR NEGATIVE 09/29/2019 0855   Sepsis Labs: @LABRCNTIP (procalcitonin:4,lacticidven:4) ) Recent Results (from the past 240 hour(s))  SARS Coronavirus 2 by RT PCR (hospital order, performed in [place] hospital lab) Nasopharyngeal Nasopharyngeal Swab     Status: None   Collection Time: 09/29/19  1:13 PM   Specimen: Nasopharyngeal Swab  Result Value Ref Range Status   SARS Coronavirus 2 NEGATIVE NEGATIVE Final    Comment: (NOTE) SARS-CoV-2 target nucleic acids are NOT DETECTED. The SARS-CoV-2 RNA is generally detectable in upper and lower respiratory specimens during the acute phase of infection. The lowest concentration of  SARS-CoV-2 viral copies this assay can detect is 250 copies / mL. A negative result does not preclude SARS-CoV-2 infection and should not be used as the sole basis for treatment or other patient management decisions.  A negative result may occur with improper specimen collection / handling, submission of specimen other than nasopharyngeal swab, presence of viral mutation(s) within the areas targeted by this assay, and inadequate number of viral copies (<250 copies / mL). A negative result must be combined with clinical observations, patient history, and epidemiological information. Fact Sheet for Patients:   StrictlyIdeas.no Fact Sheet for Healthcare Providers: BankingDealers.co.za This test is not yet approved or cleared  by the [place] FDA and has been authorized for detection and/or diagnosis of SARS-CoV-2 by FDA under an Emergency Use Authorization (EUA).  This EUA will remain in effect (meaning this test can be used) for the duration of the COVID-19 declaration under Section 564(b)(1) of the Act, 21 U.S.C. section 360bbb-3(b)(1), unless the authorization is terminated or revoked sooner. Performed at [facility], [address] [place], [place] [address]      Scheduled Meds: . aspirin EC  81 mg Oral Daily  . buPROPion  150 mg Oral Daily  . clopidogrel  75 mg Oral Daily  . enoxaparin (LOVENOX) injection  50 mg Subcutaneous Q24H  . gabapentin  300 mg Oral BID  . insulin aspart  0-15 Units Subcutaneous TID WC  . insulin aspart  0-5 Units Subcutaneous QHS  . isosorbide mononitrate  60 mg Oral Daily  . levothyroxine  25 mcg Oral Q0600  . metoprolol tartrate  12.5 mg Oral BID  . pantoprazole  40 mg Oral Daily  . pravastatin  80 mg Oral Daily   Continuous Infusions: . sodium chloride 75 mL/hr at 09/30/19 1047  . cefTRIAXone (ROCEPHIN)  IV 2 g (10/01/19 1020)  . metronidazole 500 mg (10/01/19 1508)    Procedures/Studies: CT Angio Abd/Pel W and/or Wo Contrast  Result Date: 09/29/2019 CLINICAL DATA:  74 year old with abdominal pain. Evaluate for acute mesenteric ischemia. History of partial hysterectomy. History of right hemicolectomy. EXAM: CT ANGIOGRAPHY ABDOMEN AND PELVIS WITH CONTRAST AND WITHOUT CONTRAST TECHNIQUE: Multidetector CT imaging of the abdomen and pelvis was performed using the standard protocol during bolus administration of intravenous contrast. Multiplanar reconstructed images and MIPs were obtained and reviewed to evaluate the vascular anatomy. CONTRAST:  179mL OMNIPAQUE IOHEXOL 350 MG/ML SOLN COMPARISON:  CT abdomen and pelvis without contrast 09/16/2019 and CT 06/13/2019 and CTA from 09/13/2005 FINDINGS: VASCULAR Aorta: Extensive wall calcifications involving the abdominal aorta without aortic  aneurysm or dissection. Narrowing at the aortic bifurcation. Large lumbar artery in the distal abdominal aorta. Celiac: Celiac trunk is patent. Main branch vessels are patent. Atherosclerotic calcifications at the origin of the celiac trunk and there appears to be high-grade stenosis at the celiac artery origin. SMA: Large amount of calcified plaque involving the origin and proximal aspect of the SMA. Origin and proximal SMA are occluded. There is reconstitution of the SMA approximately 5 cm from the origin. There appears to be a surgical bypass supplying flow to the SMA which originates from the distal aspect of the right common iliac artery. Main SMA branch vessels beyond the bypass appear to be patent. Renals: Single bilateral renal arteries. Bilateral renal arteries are heavily calcified and suspect high-grade stenosis near the origin of the right renal artery. Due to small size of the renal arteries, is difficult to estimate the degree of stenosis. Probably less than 50%  stenosis involving the origin of the left renal artery but there may be additional stenosis in the proximal left renal artery. No evidence for a renal artery aneurysm. IMA: Origin of the IMA is occluded. Evidence for some reconstitution of IMA. Inflow: Extensive calcified plaque at the aortic bifurcation. There appears to be near complete occlusion at the origin of the right common iliac artery. Ectasia of the right common iliac artery measuring up to 1.2 cm may represent post stenotic dilatation. There is a patent surgical bypass between the SMA and the distal right common iliac artery. Calcified plaque at the right iliac artery bifurcation and suspect at least 50% stenosis at the origin of the right external iliac artery. Right external iliac artery is patent. Stenosis at the origin of the right hypogastric artery. Greater than 70% stenosis at the origin of the left common iliac artery with post stenotic dilatation / aneurysm of the left  common iliac artery measuring up to 1.9 cm. At least mild stenosis at the origin of the left hypogastric artery. Left external iliac artery is patent without significant stenosis. Proximal Outflow: Calcified plaque involving the bilateral common femoral arteries. Proximal femoral arteries are patent bilaterally. Veins: Contrast refluxes into the hepatic veins suggesting increased right heart pressures. Portal venous system is patent. No gross abnormality to the iliac veins or IVC. Bilateral renal veins are patent. Review of the MIP images confirms the above findings. NON-VASCULAR Lower chest: Lung bases are clear.  No pleural effusions. Hepatobiliary: 6 mm hypodensity at the hepatic dome is likely an incidental finding and probably represents a hepatic cyst. No suspicious liver lesions. Normal appearance of the gallbladder. No biliary dilatation. Pancreas: Poorly defined structure along the caudal aspect of the pancreatic body on sequence 5, image 28 measures 3.0 x 2.2 cm. This was not present in February 2021 but was present on the exam from 09/16/2019. Suspect this represents a postsurgical or postinflammatory structure. No evidence for pancreatic duct dilatation. Spleen: Normal in size without focal abnormality. Adrenals/Urinary Tract: Normal adrenal glands. Normal appearance of the urinary bladder. No suspicious renal lesions. Negative for hydronephrosis. Stomach/Bowel: Normal appearance of the stomach and duodenum. Again noted is diverticulosis involving the proximal sigmoid colon with mild pericolonic edema. Compared to the recent examination, there is increased stranding and trace fluid in the left paracolic gutter. Pericolonic stranding or edema is best seen on on sequence 4, image 87. Postsurgical changes compatible with right hemicolectomy. No evidence for bowel obstruction. No evidence for pneumatosis. Lymphatic: Small lymph nodes in the upper abdomen. No significant abdominal or pelvic lymphadenopathy.  Reproductive: Status post hysterectomy. No adnexal masses. Other: No significant ascites. There is no evidence for free intraperitoneal air. Negative for an abscess collection. Musculoskeletal: Postsurgical changes along the anterior abdomen compatible with previous abdominal surgery. Laminectomy changes in the lower lumbar spine. Grade 1 anterolisthesis at L4-L5 with vacuum disc. Multilevel vacuum disc. Disc space narrowing at L5-S1. IMPRESSION: VASCULAR 1. Extensive atherosclerotic disease involving the abdominal aorta and visceral arteries. 2. 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 There is a surgical bypass between the distal right common iliac artery and the superior mesenteric artery. This surgical bypass is patent. However, there is a high-grade stenosis or near occlusion at the origin of the right common iliac artery that is supplying the SMA bypass graft. 3. Extensive atherosclerotic disease at the aortic bifurcation. Near occlusion of the right common iliac artery at the origin. Severe stenosis at the origin of  the left common iliac artery measuring greater than 70%. Evidence for post stenotic dilatation of the common iliac arteries. 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 4. Probable high-grade stenosis at the origin of the celiac trunk. 5. Bilateral renal artery stenosis. NON-VASCULAR 1. Pericolonic stranding and inflammatory changes centered around the sigmoid colon and left colon. Pericolonic inflammatory changes in the sigmoid colon region have minimally changed from 09/16/2019 at [facility]. A small amount of stranding and trace fluid in the left paracolic gutter is new since the exam on 09/16/2019. Patient has multiple colonic diverticula and it is possible the findings are related to diverticulitis. However, ischemic colitis is in the differential diagnosis based on the vascular disease and extent of the colonic disease. No  evidence for pneumatosis or free air at this time. 2. Indeterminate collection or structure just caudal to the pancreatic body. This structure is new since February of 2021. This could represent a small pancreatic pseudocyst or persistent small postoperative fluid collection. No evidence for acute pancreatic inflammation. Electronically Signed   By: Markus Daft M.D.   On: 09/29/2019 12:15    Orson Eva, DO  [facility]  If 7PM-7AM, please contact night-coverage [email or web address] Password [email or web address] 10/01/2019, 5:48 PM   LOS: 2 days

## 2019-10-01 NOTE — Progress Notes (Signed)
Patient has had no stools this shift.

## 2019-10-01 NOTE — Progress Notes (Signed)
Subjective:  Feels hungry. States her abdominal pain is improved. States diffuse rumbling discomfort with progression to right sided abdominal pain associated with vomiting and diarrhea all improved since Monday. Last BM and vomiting Monday. After surgery in 06/2019 she have quite a bit of diarrhea but "body finally adjusted" and was feeling better. Did have about a week of feeling "normal" before symptoms started again 3 weeks ago. She does not overall weight is up after revascularization.   Objective: Vital signs in last 24 hours: Temp:  [97.7 F (36.5 C)-99.3 F (37.4 C)] 98 F (36.7 C) (06/03 0537) Pulse Rate:  [56-63] 57 (06/03 0537) Resp:  [18-19] 19 (06/02 2158) BP: (98-130)/(31-57) 100/31 (06/03 0537) SpO2:  [93 %-99 %] 93 % (06/03 0537)   General:   Alert,  Well-developed, well-nourished, pleasant and cooperative in NAD Head:  Normocephalic and atraumatic. Eyes:  Sclera clear, no icterus.  Abdomen:  Soft, nontender and nondistended.  Normal bowel sounds, without guarding, and without rebound.   Extremities:  Without clubbing, deformity or edema. Neurologic:  Alert and  oriented x4;  grossly normal neurologically. Skin:  Intact without significant lesions or rashes. Psych:  Alert and cooperative. Normal mood and affect.  Intake/Output from previous day: 06/02 0701 - 06/03 0700 In: 480 [P.O.:480] Out: 700 [Urine:700] Intake/Output this shift: No intake/output data recorded.  Lab Results: CBC Recent Labs    09/29/19 0831 09/30/19 0418 10/01/19 0510  WBC 8.3 4.1 3.3*  HGB 9.0* 8.0* 8.0*  HCT 29.1* 26.6* 26.6*  MCV 99.3 98.9 100.0  PLT 301 275 243   BMET Recent Labs    09/29/19 0831 09/30/19 0418 10/01/19 0510  NA 137 139 138  K 4.4 4.4 4.2  CL 103 106 106  CO2 26 28 26   GLUCOSE 142* 90 121*  BUN 17 12 12   CREATININE 1.03* 0.97 0.92  CALCIUM 8.4* 8.2* 8.1*   LFTs Recent Labs    09/29/19 0831 09/30/19 0418  BILITOT 0.4 0.4  ALKPHOS 66 59  AST 20 13*   ALT 15 12  PROT 6.0* 5.4*  ALBUMIN 2.4* 2.2*   Recent Labs    09/29/19 0831  LIPASE 23   PT/INR Recent Labs    09/29/19 0831  LABPROT 14.3  INR 1.2      Imaging Studies: CT Angio Abd/Pel W and/or Wo Contrast  Result Date: 09/29/2019 CLINICAL DATA:  74 year old with abdominal pain. Evaluate for acute mesenteric ischemia. History of partial hysterectomy. History of right hemicolectomy. EXAM: CT ANGIOGRAPHY ABDOMEN AND PELVIS WITH CONTRAST AND WITHOUT CONTRAST TECHNIQUE: Multidetector CT imaging of the abdomen and pelvis was performed using the standard protocol during bolus administration of intravenous contrast. Multiplanar reconstructed images and MIPs were obtained and reviewed to evaluate the vascular anatomy. CONTRAST:  183mL OMNIPAQUE IOHEXOL 350 MG/ML SOLN COMPARISON:  CT abdomen and pelvis without contrast 09/16/2019 and CT 06/13/2019 and CTA from 09/13/2005 FINDINGS: VASCULAR Aorta: Extensive wall calcifications involving the abdominal aorta without aortic aneurysm or dissection. Narrowing at the aortic bifurcation. Large lumbar artery in the distal abdominal aorta. Celiac: Celiac trunk is patent. Main branch vessels are patent. Atherosclerotic calcifications at the origin of the celiac trunk and there appears to be high-grade stenosis at the celiac artery origin. SMA: Large amount of calcified plaque involving the origin and proximal aspect of the SMA. Origin and proximal SMA are occluded. There is reconstitution of the SMA approximately 5 cm from the origin. There appears to be a surgical bypass supplying flow to the  SMA which originates from the distal aspect of the right common iliac artery. Main SMA branch vessels beyond the bypass appear to be patent. Renals: Single bilateral renal arteries. Bilateral renal arteries are heavily calcified and suspect high-grade stenosis near the origin of the right renal artery. Due to small size of the renal arteries, is difficult to estimate the  degree of stenosis. Probably less than 50% stenosis involving the origin of the left renal artery but there may be additional stenosis in the proximal left renal artery. No evidence for a renal artery aneurysm. IMA: Origin of the IMA is occluded. Evidence for some reconstitution of IMA. Inflow: Extensive calcified plaque at the aortic bifurcation. There appears to be near complete occlusion at the origin of the right common iliac artery. Ectasia of the right common iliac artery measuring up to 1.2 cm may represent post stenotic dilatation. There is a patent surgical bypass between the SMA and the distal right common iliac artery. Calcified plaque at the right iliac artery bifurcation and suspect at least 50% stenosis at the origin of the right external iliac artery. Right external iliac artery is patent. Stenosis at the origin of the right hypogastric artery. Greater than 70% stenosis at the origin of the left common iliac artery with post stenotic dilatation / aneurysm of the left common iliac artery measuring up to 1.9 cm. At least mild stenosis at the origin of the left hypogastric artery. Left external iliac artery is patent without significant stenosis. Proximal Outflow: Calcified plaque involving the bilateral common femoral arteries. Proximal femoral arteries are patent bilaterally. Veins: Contrast refluxes into the hepatic veins suggesting increased right heart pressures. Portal venous system is patent. No gross abnormality to the iliac veins or IVC. Bilateral renal veins are patent. Review of the MIP images confirms the above findings. NON-VASCULAR Lower chest: Lung bases are clear.  No pleural effusions. Hepatobiliary: 6 mm hypodensity at the hepatic dome is likely an incidental finding and probably represents a hepatic cyst. No suspicious liver lesions. Normal appearance of the gallbladder. No biliary dilatation. Pancreas: Poorly defined structure along the caudal aspect of the pancreatic body on sequence  5, image 28 measures 3.0 x 2.2 cm. This was not present in February 2021 but was present on the exam from 09/16/2019. Suspect this represents a postsurgical or postinflammatory structure. No evidence for pancreatic duct dilatation. Spleen: Normal in size without focal abnormality. Adrenals/Urinary Tract: Normal adrenal glands. Normal appearance of the urinary bladder. No suspicious renal lesions. Negative for hydronephrosis. Stomach/Bowel: Normal appearance of the stomach and duodenum. Again noted is diverticulosis involving the proximal sigmoid colon with mild pericolonic edema. Compared to the recent examination, there is increased stranding and trace fluid in the left paracolic gutter. Pericolonic stranding or edema is best seen on on sequence 4, image 87. Postsurgical changes compatible with right hemicolectomy. No evidence for bowel obstruction. No evidence for pneumatosis. Lymphatic: Small lymph nodes in the upper abdomen. No significant abdominal or pelvic lymphadenopathy. Reproductive: Status post hysterectomy. No adnexal masses. Other: No significant ascites. There is no evidence for free intraperitoneal air. Negative for an abscess collection. Musculoskeletal: Postsurgical changes along the anterior abdomen compatible with previous abdominal surgery. Laminectomy changes in the lower lumbar spine. Grade 1 anterolisthesis at L4-L5 with vacuum disc. Multilevel vacuum disc. Disc space narrowing at L5-S1. IMPRESSION: VASCULAR 1. Extensive atherosclerotic disease involving the abdominal aorta and visceral arteries. 2. 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 There is a surgical bypass  between the distal right common iliac artery and the superior mesenteric artery. This surgical bypass is patent. However, there is a high-grade stenosis or near occlusion at the origin of the right common iliac artery that is supplying the SMA bypass graft. 3. Extensive atherosclerotic disease at the  aortic bifurcation. Near occlusion of the right common iliac artery at the origin. Severe stenosis at the origin of the left common iliac artery measuring greater than 70%. Evidence for post stenotic dilatation of the common iliac arteries. 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 4. Probable high-grade stenosis at the origin of the celiac trunk. 5. Bilateral renal artery stenosis. NON-VASCULAR 1. Pericolonic stranding and inflammatory changes centered around the sigmoid colon and left colon. Pericolonic inflammatory changes in the sigmoid colon region have minimally changed from 09/16/2019 at [facility] A small amount of stranding and trace fluid in the left paracolic gutter is new since the exam on 09/16/2019. Patient has multiple colonic diverticula and it is possible the findings are related to diverticulitis. However, ischemic colitis is in the differential diagnosis based on the vascular disease and extent of the colonic disease. No evidence for pneumatosis or free air at this time. 2. Indeterminate collection or structure just caudal to the pancreatic body. This structure is new since February of 2021. This could represent a small pancreatic pseudocyst or persistent small postoperative fluid collection. No evidence for acute pancreatic inflammation. Electronically Signed   By: Markus Daft M.D.   On: 09/29/2019 12:15  [2 weeks]   Assessment: 74 year old female with extensive vascular history of acute mesenteric ischemia status post retrograde right iliac to SMA bypass with left lower extremity rGSV, right hemicolectomy in February 2021 (presented at that time with acute onset severe abdominal pain with pneumatosis of the right colon and portal venous gas on imaging, CTA demonstrated high-grade stenosis of the celiac and occlusion of the proximal SMA). Presenting this admission with several week history of persisting abdominal pain, nausea/vomiting, diarrhea.  Treated  with Augmentin as an outpatient for diverticulitis seen on CT at [facility] 09/16/19 with no noted improvement.  Case was discussed by ED physician with patient's primary vascular surgeon, Dr. Oletta Lamas at [facility]  Images were reviewed and was not felt the patient symptoms were related to issue with blood flow.  CT reviewed by Dr. Laural Golden with Dr. Thornton Papas as well. CT showed thickening of the left colon, started on Rocephin and Flagyl this admission.  Stool studies pending, patient has not had any further stools since admission.Lactic acid on admission was 1.7.    CTA abdomen and pelvis yesterday.  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  There are surgical bypass between the distal right common iliac artery and the superior mesenteric artery.  Surgical bypass is patent however there is high-grade stenosis or near occlusion at the origin of the right common iliac artery that is supplying the SMA bypass graft.  Suspected high-grade stenosis at the origin of the celiac trunk.  Bilateral renal artery stenosis.  Pericolonic stranding and inflammatory changes around the sigmoid colon and left colon.  Minimally changed from Sep 16, 2019 study.  There is some new trace fluid in the left paracolic gutter.  Differential diagnosis includes diverticulitis, colitis, ischemic colitis.  Also with indeterminate collection or structure just caudal to the pancreatic body, new from February 2021 study.  Could represent a small pancreatic pseudocyst or persistent small postoperative fluid collection.  Plan: 1. Stools studies if recurrent diarrhea.  2. Continue antibiotic therapy.  3. Observe closely for any clinical decline.  4. Discussed with Dr. Gala Romney, will advance to low residue/low fiber diet.  Laureen Ochs. Bernarda Caffey [facility] [phone] 6/3/202111:12 AM     LOS: 2 days

## 2019-10-02 LAB — GASTROINTESTINAL PANEL BY PCR, STOOL (REPLACES STOOL CULTURE)

## 2019-10-02 LAB — CBC
HCT: 26.4 % — ABNORMAL LOW (ref 36.0–46.0)
Hemoglobin: 7.9 g/dL — ABNORMAL LOW (ref 12.0–15.0)
MCH: 30.5 pg (ref 26.0–34.0)
MCHC: 29.9 g/dL — ABNORMAL LOW (ref 30.0–36.0)
MCV: 101.9 fL — ABNORMAL HIGH (ref 80.0–100.0)
Platelets: 243 10*3/uL (ref 150–400)
RBC: 2.59 MIL/uL — ABNORMAL LOW (ref 3.87–5.11)
RDW: 14.6 % (ref 11.5–15.5)
WBC: 3.7 10*3/uL — ABNORMAL LOW (ref 4.0–10.5)
nRBC: 0 % (ref 0.0–0.2)

## 2019-10-02 LAB — BASIC METABOLIC PANEL
Anion gap: 5 (ref 5–15)
BUN: 10 mg/dL (ref 8–23)
CO2: 26 mmol/L (ref 22–32)
Calcium: 7.9 mg/dL — ABNORMAL LOW (ref 8.9–10.3)
Chloride: 108 mmol/L (ref 98–111)
Creatinine, Ser: 0.91 mg/dL (ref 0.44–1.00)
GFR calc Af Amer: >60 mL/min (ref >60)
GFR calc non Af Amer: >60 mL/min (ref >60)
Glucose, Bld: 115 mg/dL — ABNORMAL HIGH (ref 70–99)
Potassium: 4.3 mmol/L (ref 3.5–5.1)
Sodium: 139 mmol/L (ref 135–145)

## 2019-10-02 LAB — GLUCOSE, CAPILLARY: Glucose-Capillary: 105 mg/dL — ABNORMAL HIGH (ref 70–99)

## 2019-10-02 LAB — MAGNESIUM: Magnesium: 1.6 mg/dL — ABNORMAL LOW (ref 1.7–2.4)

## 2019-10-02 MED ORDER — METRONIDAZOLE 500 MG PO TABS: 500.0000 mg | ORAL_TABLET | Freq: Three times a day (TID) | ORAL | Status: DC

## 2019-10-02 MED ORDER — ONDANSETRON 4 MG PO TBDP: 4.0000 mg | ORAL_TABLET | Freq: Three times a day (TID) | ORAL | Status: DC | PRN

## 2019-10-02 MED ORDER — CIPROFLOXACIN HCL 500 MG PO TABS: 500.0000 mg | ORAL_TABLET | Freq: Two times a day (BID) | ORAL | 0 refills | Status: DC

## 2019-10-02 MED ORDER — ONDANSETRON 4 MG PO TBDP: 4.0000 mg | ORAL_TABLET | Freq: Three times a day (TID) | ORAL | 0 refills | Status: DC | PRN

## 2019-10-02 MED ORDER — METRONIDAZOLE 500 MG PO TABS: 500.0000 mg | ORAL_TABLET | Freq: Three times a day (TID) | ORAL | 0 refills | Status: DC

## 2019-10-02 MED ORDER — MAGNESIUM SULFATE 2 GM/50ML IV SOLN
2.0000 g | Freq: Once | INTRAVENOUS | Status: AC
  Administered 2019-10-02: 2 g via INTRAVENOUS
  Filled 2019-10-02: qty 50

## 2019-10-02 MED ORDER — CIPROFLOXACIN HCL 250 MG PO TABS
500.0000 mg | ORAL_TABLET | Freq: Two times a day (BID) | ORAL | Status: DC
  Administered 2019-10-02: 500 mg via ORAL
  Filled 2019-10-02: qty 2

## 2019-10-02 NOTE — Progress Notes (Signed)
D/C instructions reviewed with patient, verbalized understanding. IV Mag infusing, IV to be removed after infusion. Family at bedside, will continue to monitor.

## 2019-10-02 NOTE — Discharge Summary (Addendum)
Physician Discharge Summary  [name] EYC:[id number] DOB: Nov 12, 1945 DOA: 09/29/2019  PCP: Patient, No Pcp Per  Admit date: 09/29/2019 Discharge date: 10/02/2019  Admitted From: Home Disposition:  Home   Recommendations for Outpatient Follow-up:  1. Follow up with PCP in 1-2 weeks 2. Please obtain BMP/CBC in one week     Discharge Condition: Stable CODE STATUS: FULL Diet recommendation: Heart Healthy / Carb Modified   Brief/Interim Summary: 74 year old female with a history of mesenteric ischemia, coronary disease, hypertension, hypothyroidism, diabetes mellitus type 2, depression, hyperlipidemia presenting with 3-week history of abdominal pain that worsened over the past week with associated nausea and vomiting that began on 09/24/2019.  Notably, the patient has a history of critical mesenteric ischemia for which she required right hemicolectomy and right iliac to SMA bypass performed at [facility] on 06/13/2019.  The patient states that she will occasionally still smoke cigarettes but not on a daily basis.  She has been compliant with her medications.  She has had some loose stools intermittently over the past week without hematochezia or melena.  She denied any chest pain, shortness breath, cough, hemoptysis, nausea, vomiting. Notably, the patient had was admitted to [facility] approximately 2 weeks prior to this admission.  She was discharged home with Augmentin.  She stated that she finished Augmentin approximately 3 days prior to this admission.  She stated that her abdominal pain continue to worsen as discussed above.  In the ED, the patient was afebrile and hemodynamically stable.  CT a of the abdomen pelvis was obtained.  Results as discussed below.  EDP spoke with vascular surgery at [facility] who did not feel the patient required transfer or acute vascular intervention at this time.  Discharge Diagnoses:  Ischemic Colitis -09/29/19 CTA abd/pelvis--chronic occlusion @origin  SMA/IMA; patent  surgical bypass between AMS and distal R-common iliac; near occlusion right common iliac; severe stenosis left common iliac with a stenotic dilatation; probable high-grade stenosis of the origin the celiac trunk; bilateral RAS; pericolonic stranding and inflammation of the sigmoid and left colon with minimal change since 09/16/2019.  Small amount of stranding and trace fluid in the left paracolic gutter. -EDP spoke with vascular surgery at [facility], Dr. Orson Slick were reviewed and it was not felt that patient symptoms were related to an issue with blood flow. Regardless, it was not felt that any further vascular intervention was indicated/could be offered at this time -placed on bowel rest initially with gradual diet advancement -continue ceftriaxone and metronidazole -her abd pain and n/v improved gradually -no abd pain on day of d/c -Continued IV fluids -Continue clear liquid diet>>advanced to soft diet which pt tolerated  -Cdiff negative; stool pathogen panel pending at time of d/c -had 2 BMs total for entire hospitalization -d/c home with cipro/flagyl x 7 more days to complete 10 days tx  Intractable nausea and vomiting -Clear liquid diet for now >>soft diet which she tolerated -UA negative for pyuria -Lipase 23  Diabetes mellitus type 2 -09/29/2019 hemoglobin A1c 6.6 -restart metformin after d/c  Mesenteric ischemia -Continue aspirin and Plavix  Coronary artery disease -No chest pain presently -Continue aspirin and Plavix -Continue metoprolol tartrate  Essential hypertension -Continue amlodipine and metoprolol tartrate  Hyperlipidemia -Continue statin  Hypothyroidism -continue synthroid  Hypomagnesemia -repleted   Discharge Instructions   Allergies as of 10/02/2019   No Known Allergies     Medication List    TAKE these medications   amLODipine 5 MG tablet Commonly known as: NORVASC Take 5 mg by  mouth daily.   aspirin EC 81 MG tablet Take  81 mg by mouth daily.   buPROPion 150 MG 24 hr tablet Commonly known as: WELLBUTRIN XL Take 150 mg by mouth in the morning and at bedtime.   ciprofloxacin 500 MG tablet Commonly known as: CIPRO Take 1 tablet (500 mg total) by mouth 2 (two) times daily.   clopidogrel 75 MG tablet Commonly known as: PLAVIX Take 75 mg by mouth daily.   gabapentin 300 MG capsule Commonly known as: NEURONTIN Take 300 mg by mouth 2 (two) times daily.   isosorbide mononitrate 60 MG 24 hr tablet Commonly known as: IMDUR Take 60 mg by mouth daily.   levothyroxine 25 MCG tablet Commonly known as: SYNTHROID Take 1 tablet by mouth daily.   metFORMIN 500 MG tablet Commonly known as: GLUCOPHAGE Take 500 mg by mouth 2 (two) times daily.   metoprolol tartrate 25 MG tablet Commonly known as: LOPRESSOR Take 0.5 tablets (12.5 mg total) by mouth 2 (two) times daily.   metroNIDAZOLE 500 MG tablet Commonly known as: FLAGYL Take 1 tablet (500 mg total) by mouth every 8 (eight) hours.   nitroGLYCERIN 0.4 MG SL tablet Commonly known as: NITROSTAT Place 1 tablet (0.4 mg total) under the tongue every 5 (five) minutes as needed.   ondansetron 4 MG disintegrating tablet Commonly known as: ZOFRAN-ODT Take 1 tablet (4 mg total) by mouth every 8 (eight) hours as needed for nausea or vomiting.   oxyCODONE-acetaminophen 5-325 MG tablet Commonly known as: PERCOCET/ROXICET Take 1 tablet by mouth every 4 (four) hours as needed for severe pain.   pantoprazole 40 MG tablet Commonly known as: PROTONIX Take 40 mg by mouth daily.   polyethylene glycol 17 g packet Commonly known as: MIRALAX / GLYCOLAX Take 17 g by mouth daily.   pravastatin 80 MG tablet Commonly known as: PRAVACHOL Take 80 mg by mouth daily.   Spiriva HandiHaler 18 MCG inhalation capsule Generic drug: tiotropium 1 capsule daily.   Tiotropium Bromide-Olodaterol 2.5-2.5 MCG/ACT Aers Commonly known as: Stiolto Respimat Inhale 2 puffs into the  lungs daily.   torsemide 20 MG tablet Commonly known as: DEMADEX Take 2 tablets (40 mg total) by mouth as needed.   zolpidem 10 MG tablet Commonly known as: AMBIEN Take 10 mg by mouth at bedtime as needed for sleep.       No Known Allergies  Consultations:  GI   Procedures/Studies: CT Angio Abd/Pel W and/or Wo Contrast  Result Date: 09/29/2019 CLINICAL DATA:  74 year old with abdominal pain. Evaluate for acute mesenteric ischemia. History of partial hysterectomy. History of right hemicolectomy. EXAM: CT ANGIOGRAPHY ABDOMEN AND PELVIS WITH CONTRAST AND WITHOUT CONTRAST TECHNIQUE: Multidetector CT imaging of the abdomen and pelvis was performed using the standard protocol during bolus administration of intravenous contrast. Multiplanar reconstructed images and MIPs were obtained and reviewed to evaluate the vascular anatomy. CONTRAST:  120mL OMNIPAQUE IOHEXOL 350 MG/ML SOLN COMPARISON:  CT abdomen and pelvis without contrast 09/16/2019 and CT 06/13/2019 and CTA from 09/13/2005 FINDINGS: VASCULAR Aorta: Extensive wall calcifications involving the abdominal aorta without aortic aneurysm or dissection. Narrowing at the aortic bifurcation. Large lumbar artery in the distal abdominal aorta. Celiac: Celiac trunk is patent. Main branch vessels are patent. Atherosclerotic calcifications at the origin of the celiac trunk and there appears to be high-grade stenosis at the celiac artery origin. SMA: Large amount of calcified plaque involving the origin and proximal aspect of the SMA. Origin and proximal SMA are occluded. There is  reconstitution of the SMA approximately 5 cm from the origin. There appears to be a surgical bypass supplying flow to the SMA which originates from the distal aspect of the right common iliac artery. Main SMA branch vessels beyond the bypass appear to be patent. Renals: Single bilateral renal arteries. Bilateral renal arteries are heavily calcified and suspect high-grade stenosis  near the origin of the right renal artery. Due to small size of the renal arteries, is difficult to estimate the degree of stenosis. Probably less than 50% stenosis involving the origin of the left renal artery but there may be additional stenosis in the proximal left renal artery. No evidence for a renal artery aneurysm. IMA: Origin of the IMA is occluded. Evidence for some reconstitution of IMA. Inflow: Extensive calcified plaque at the aortic bifurcation. There appears to be near complete occlusion at the origin of the right common iliac artery. Ectasia of the right common iliac artery measuring up to 1.2 cm may represent post stenotic dilatation. There is a patent surgical bypass between the SMA and the distal right common iliac artery. Calcified plaque at the right iliac artery bifurcation and suspect at least 50% stenosis at the origin of the right external iliac artery. Right external iliac artery is patent. Stenosis at the origin of the right hypogastric artery. Greater than 70% stenosis at the origin of the left common iliac artery with post stenotic dilatation / aneurysm of the left common iliac artery measuring up to 1.9 cm. At least mild stenosis at the origin of the left hypogastric artery. Left external iliac artery is patent without significant stenosis. Proximal Outflow: Calcified plaque involving the bilateral common femoral arteries. Proximal femoral arteries are patent bilaterally. Veins: Contrast refluxes into the hepatic veins suggesting increased right heart pressures. Portal venous system is patent. No gross abnormality to the iliac veins or IVC. Bilateral renal veins are patent. Review of the MIP images confirms the above findings. NON-VASCULAR Lower chest: Lung bases are clear.  No pleural effusions. Hepatobiliary: 6 mm hypodensity at the hepatic dome is likely an incidental finding and probably represents a hepatic cyst. No suspicious liver lesions. Normal appearance of the gallbladder. No  biliary dilatation. Pancreas: Poorly defined structure along the caudal aspect of the pancreatic body on sequence 5, image 28 measures 3.0 x 2.2 cm. This was not present in February 2021 but was present on the exam from 09/16/2019. Suspect this represents a postsurgical or postinflammatory structure. No evidence for pancreatic duct dilatation. Spleen: Normal in size without focal abnormality. Adrenals/Urinary Tract: Normal adrenal glands. Normal appearance of the urinary bladder. No suspicious renal lesions. Negative for hydronephrosis. Stomach/Bowel: Normal appearance of the stomach and duodenum. Again noted is diverticulosis involving the proximal sigmoid colon with mild pericolonic edema. Compared to the recent examination, there is increased stranding and trace fluid in the left paracolic gutter. Pericolonic stranding or edema is best seen on on sequence 4, image 87. Postsurgical changes compatible with right hemicolectomy. No evidence for bowel obstruction. No evidence for pneumatosis. Lymphatic: Small lymph nodes in the upper abdomen. No significant abdominal or pelvic lymphadenopathy. Reproductive: Status post hysterectomy. No adnexal masses. Other: No significant ascites. There is no evidence for free intraperitoneal air. Negative for an abscess collection. Musculoskeletal: Postsurgical changes along the anterior abdomen compatible with previous abdominal surgery. Laminectomy changes in the lower lumbar spine. Grade 1 anterolisthesis at L4-L5 with vacuum disc. Multilevel vacuum disc. Disc space narrowing at L5-S1. IMPRESSION: VASCULAR 1. Extensive atherosclerotic disease involving the abdominal aorta  and visceral arteries. 2. 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 There is a surgical bypass between the distal right common iliac artery and the superior mesenteric artery. This surgical bypass is patent. However, there is a high-grade stenosis or near occlusion at the origin of  the right common iliac artery that is supplying the SMA bypass graft. 3. Extensive atherosclerotic disease at the aortic bifurcation. Near occlusion of the right common iliac artery at the origin. Severe stenosis at the origin of the left common iliac artery measuring greater than 70%. Evidence for post stenotic dilatation of the common iliac arteries. 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 4. Probable high-grade stenosis at the origin of the celiac trunk. 5. Bilateral renal artery stenosis. NON-VASCULAR 1. Pericolonic stranding and inflammatory changes centered around the sigmoid colon and left colon. Pericolonic inflammatory changes in the sigmoid colon region have minimally changed from 09/16/2019 at [facility]. A small amount of stranding and trace fluid in the left paracolic gutter is new since the exam on 09/16/2019. Patient has multiple colonic diverticula and it is possible the findings are related to diverticulitis. However, ischemic colitis is in the differential diagnosis based on the vascular disease and extent of the colonic disease. No evidence for pneumatosis or free air at this time. 2. Indeterminate collection or structure just caudal to the pancreatic body. This structure is new since February of 2021. This could represent a small pancreatic pseudocyst or persistent small postoperative fluid collection. No evidence for acute pancreatic inflammation. Electronically Signed   By: Markus Daft M.D.   On: 09/29/2019 12:15         Discharge Exam: Vitals:   10/02/19 0513 10/02/19 0750  BP: (!) 133/39   Pulse: 65   Resp: 18   Temp: 98.4 F (36.9 C)   SpO2: 92% 96%   Vitals:   10/01/19 1349 10/01/19 2006 10/02/19 0513 10/02/19 0750  BP: (!) 136/45 (!) 147/61 (!) 133/39   Pulse: 62 73 65   Resp: 20  18   Temp: 98 F (36.7 C) (!) 97.4 F (36.3 C) 98.4 F (36.9 C)   TempSrc:  Oral Oral   SpO2: 97% 95% 92% 96%  Weight:      Height:         General: Pt is alert, awake, not in acute distress Cardiovascular: RRR, S1/S2 +, no rubs, no gallops Respiratory: CTA bilaterally, no wheezing, no rhonchi Abdominal: Soft, NT, ND, bowel sounds + Extremities: no edema, no cyanosis   The results of significant diagnostics from this hospitalization (including imaging, microbiology, ancillary and laboratory) are listed below for reference.    Significant Diagnostic Studies: CT Angio Abd/Pel W and/or Wo Contrast  Result Date: 09/29/2019 CLINICAL DATA:  74 year old with abdominal pain. Evaluate for acute mesenteric ischemia. History of partial hysterectomy. History of right hemicolectomy. EXAM: CT ANGIOGRAPHY ABDOMEN AND PELVIS WITH CONTRAST AND WITHOUT CONTRAST TECHNIQUE: Multidetector CT imaging of the abdomen and pelvis was performed using the standard protocol during bolus administration of intravenous contrast. Multiplanar reconstructed images and MIPs were obtained and reviewed to evaluate the vascular anatomy. CONTRAST:  191mL OMNIPAQUE IOHEXOL 350 MG/ML SOLN COMPARISON:  CT abdomen and pelvis without contrast 09/16/2019 and CT 06/13/2019 and CTA from 09/13/2005 FINDINGS: VASCULAR Aorta: Extensive wall calcifications involving the abdominal aorta without aortic aneurysm or dissection. Narrowing at the aortic bifurcation. Large lumbar artery in the distal abdominal aorta. Celiac: Celiac trunk is patent. Main  branch vessels are patent. Atherosclerotic calcifications at the origin of the celiac trunk and there appears to be high-grade stenosis at the celiac artery origin. SMA: Large amount of calcified plaque involving the origin and proximal aspect of the SMA. Origin and proximal SMA are occluded. There is reconstitution of the SMA approximately 5 cm from the origin. There appears to be a surgical bypass supplying flow to the SMA which originates from the distal aspect of the right common iliac artery. Main SMA branch vessels beyond the bypass appear  to be patent. Renals: Single bilateral renal arteries. Bilateral renal arteries are heavily calcified and suspect high-grade stenosis near the origin of the right renal artery. Due to small size of the renal arteries, is difficult to estimate the degree of stenosis. Probably less than 50% stenosis involving the origin of the left renal artery but there may be additional stenosis in the proximal left renal artery. No evidence for a renal artery aneurysm. IMA: Origin of the IMA is occluded. Evidence for some reconstitution of IMA. Inflow: Extensive calcified plaque at the aortic bifurcation. There appears to be near complete occlusion at the origin of the right common iliac artery. Ectasia of the right common iliac artery measuring up to 1.2 cm may represent post stenotic dilatation. There is a patent surgical bypass between the SMA and the distal right common iliac artery. Calcified plaque at the right iliac artery bifurcation and suspect at least 50% stenosis at the origin of the right external iliac artery. Right external iliac artery is patent. Stenosis at the origin of the right hypogastric artery. Greater than 70% stenosis at the origin of the left common iliac artery with post stenotic dilatation / aneurysm of the left common iliac artery measuring up to 1.9 cm. At least mild stenosis at the origin of the left hypogastric artery. Left external iliac artery is patent without significant stenosis. Proximal Outflow: Calcified plaque involving the bilateral common femoral arteries. Proximal femoral arteries are patent bilaterally. Veins: Contrast refluxes into the hepatic veins suggesting increased right heart pressures. Portal venous system is patent. No gross abnormality to the iliac veins or IVC. Bilateral renal veins are patent. Review of the MIP images confirms the above findings. NON-VASCULAR Lower chest: Lung bases are clear.  No pleural effusions. Hepatobiliary: 6 mm hypodensity at the hepatic dome is  likely an incidental finding and probably represents a hepatic cyst. No suspicious liver lesions. Normal appearance of the gallbladder. No biliary dilatation. Pancreas: Poorly defined structure along the caudal aspect of the pancreatic body on sequence 5, image 28 measures 3.0 x 2.2 cm. This was not present in February 2021 but was present on the exam from 09/16/2019. Suspect this represents a postsurgical or postinflammatory structure. No evidence for pancreatic duct dilatation. Spleen: Normal in size without focal abnormality. Adrenals/Urinary Tract: Normal adrenal glands. Normal appearance of the urinary bladder. No suspicious renal lesions. Negative for hydronephrosis. Stomach/Bowel: Normal appearance of the stomach and duodenum. Again noted is diverticulosis involving the proximal sigmoid colon with mild pericolonic edema. Compared to the recent examination, there is increased stranding and trace fluid in the left paracolic gutter. Pericolonic stranding or edema is best seen on on sequence 4, image 87. Postsurgical changes compatible with right hemicolectomy. No evidence for bowel obstruction. No evidence for pneumatosis. Lymphatic: Small lymph nodes in the upper abdomen. No significant abdominal or pelvic lymphadenopathy. Reproductive: Status post hysterectomy. No adnexal masses. Other: No significant ascites. There is no evidence for free intraperitoneal air. Negative for  an abscess collection. Musculoskeletal: Postsurgical changes along the anterior abdomen compatible with previous abdominal surgery. Laminectomy changes in the lower lumbar spine. Grade 1 anterolisthesis at L4-L5 with vacuum disc. Multilevel vacuum disc. Disc space narrowing at L5-S1. IMPRESSION: VASCULAR 1. Extensive atherosclerotic disease involving the abdominal aorta and visceral arteries. 2. 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 There is a surgical bypass between the distal right common iliac artery and  the superior mesenteric artery. This surgical bypass is patent. However, there is a high-grade stenosis or near occlusion at the origin of the right common iliac artery that is supplying the SMA bypass graft. 3. Extensive atherosclerotic disease at the aortic bifurcation. Near occlusion of the right common iliac artery at the origin. Severe stenosis at the origin of the left common iliac artery measuring greater than 70%. Evidence for post stenotic dilatation of the common iliac arteries. 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 4. Probable high-grade stenosis at the origin of the celiac trunk. 5. Bilateral renal artery stenosis. NON-VASCULAR 1. Pericolonic stranding and inflammatory changes centered around the sigmoid colon and left colon. Pericolonic inflammatory changes in the sigmoid colon region have minimally changed from 09/16/2019 at [facility]. A small amount of stranding and trace fluid in the left paracolic gutter is new since the exam on 09/16/2019. Patient has multiple colonic diverticula and it is possible the findings are related to diverticulitis. However, ischemic colitis is in the differential diagnosis based on the vascular disease and extent of the colonic disease. No evidence for pneumatosis or free air at this time. 2. Indeterminate collection or structure just caudal to the pancreatic body. This structure is new since February of 2021. This could represent a small pancreatic pseudocyst or persistent small postoperative fluid collection. No evidence for acute pancreatic inflammation. Electronically Signed   By: Markus Daft M.D.   On: 09/29/2019 12:15     Microbiology: Recent Results (from the past 240 hour(s))  SARS Coronavirus 2 by RT PCR (hospital order, performed in [facility] hospital lab) Nasopharyngeal Nasopharyngeal Swab     Status: None   Collection Time: 09/29/19  1:13 PM   Specimen: Nasopharyngeal Swab  Result Value Ref Range Status   SARS  Coronavirus 2 NEGATIVE NEGATIVE Final    Comment: (NOTE) SARS-CoV-2 target nucleic acids are NOT DETECTED. The SARS-CoV-2 RNA is generally detectable in upper and lower respiratory specimens during the acute phase of infection. The lowest concentration of SARS-CoV-2 viral copies this assay can detect is 250 copies / mL. A negative result does not preclude SARS-CoV-2 infection and should not be used as the sole basis for treatment or other patient management decisions.  A negative result may occur with improper specimen collection / handling, submission of specimen other than nasopharyngeal swab, presence of viral mutation(s) within the areas targeted by this assay, and inadequate number of viral copies (<250 copies / mL). A negative result must be combined with clinical observations, patient history, and epidemiological information. Fact Sheet for Patients:   StrictlyIdeas.no Fact Sheet for Healthcare Providers: BankingDealers.co.za This test is not yet approved or cleared  by the [place] FDA and has been authorized for detection and/or diagnosis of SARS-CoV-2 by FDA under an Emergency Use Authorization (EUA).  This EUA will remain in effect (meaning this test can be used) for the duration of the COVID-19 declaration under Section 564(b)(1) of the Act, 21 U.S.C. section 360bbb-3(b)(1), unless the authorization is terminated or  revoked sooner. Performed at [facility], [address] [place], [place] [address]   C Difficile Quick Screen w PCR reflex     Status: None   Collection Time: 09/30/19 10:14 AM   Specimen: STOOL  Result Value Ref Range Status   C Diff antigen NEGATIVE NEGATIVE Final   C Diff toxin NEGATIVE NEGATIVE Final   C Diff interpretation No C. difficile detected.  Final    Comment: Performed at [facility], [address], [place], [place] [address]     Labs: Basic Metabolic Panel: Recent Labs  Lab  09/29/19 0831 09/29/19 0831 09/30/19 0418 09/30/19 0418 10/01/19 0510 10/02/19 0500  NA 137  --  139  --  138 139  K 4.4   < > 4.4   < > 4.2 4.3  CL 103  --  106  --  106 108  CO2 26  --  28  --  26 26  GLUCOSE 142*  --  90  --  121* 115*  BUN 17  --  12  --  12 10  CREATININE 1.03*  --  0.97  --  0.92 0.91  CALCIUM 8.4*  --  8.2*  --  8.1* 7.9*  MG  --   --   --   --  1.8 1.6*   < > = values in this interval not displayed.   Liver Function Tests: Recent Labs  Lab 09/29/19 0831 09/30/19 0418  AST 20 13*  ALT 15 12  ALKPHOS 66 59  BILITOT 0.4 0.4  PROT 6.0* 5.4*  ALBUMIN 2.4* 2.2*   Recent Labs  Lab 09/29/19 0831  LIPASE 23   No results for input(s): AMMONIA in the last 168 hours. CBC: Recent Labs  Lab 09/29/19 0831 09/30/19 0418 10/01/19 0510 10/02/19 0500  WBC 8.3 4.1 3.3* 3.7*  NEUTROABS 6.5  --   --   --   HGB 9.0* 8.0* 8.0* 7.9*  HCT 29.1* 26.6* 26.6* 26.4*  MCV 99.3 98.9 100.0 101.9*  PLT 301 275 243 243   Cardiac Enzymes: No results for input(s): CKTOTAL, CKMB, CKMBINDEX, TROPONINI in the last 168 hours. BNP: Invalid input(s): POCBNP CBG: Recent Labs  Lab 10/01/19 0749 10/01/19 1124 10/01/19 1641 10/01/19 2052 10/02/19 0821  GLUCAP 109* 92 95 113* 105*    Time coordinating discharge:  36 minutes  Signed:  Orson Eva, DO [facility] Pager: [phone] 10/02/2019, 10:02 AM

## 2019-10-08 ENCOUNTER — Institutional Professional Consult (permissible substitution): Payer: Medicare Other | Admitting: Pulmonary Disease

## 2019-10-15 ENCOUNTER — Ambulatory Visit (INDEPENDENT_AMBULATORY_CARE_PROVIDER_SITE_OTHER): Payer: Medicare Other | Admitting: Internal Medicine

## 2019-10-15 ENCOUNTER — Encounter: Payer: Self-pay | Admitting: Internal Medicine

## 2019-10-15 ENCOUNTER — Other Ambulatory Visit: Payer: Self-pay

## 2019-10-15 VITALS — BP 123/75 | HR 87 | Temp 97.9°F | Resp 15 | Ht 63.0 in | Wt 232.1 lb

## 2019-10-15 DIAGNOSIS — I10 Essential (primary) hypertension: Secondary | ICD-10-CM | POA: Diagnosis not present

## 2019-10-15 DIAGNOSIS — Z7689 Persons encountering health services in other specified circumstances: Secondary | ICD-10-CM

## 2019-10-15 DIAGNOSIS — R600 Localized edema: Secondary | ICD-10-CM | POA: Diagnosis not present

## 2019-10-15 DIAGNOSIS — R140 Abdominal distension (gaseous): Secondary | ICD-10-CM | POA: Diagnosis not present

## 2019-10-15 DIAGNOSIS — F418 Other specified anxiety disorders: Secondary | ICD-10-CM

## 2019-10-15 DIAGNOSIS — Z09 Encounter for follow-up examination after completed treatment for conditions other than malignant neoplasm: Secondary | ICD-10-CM

## 2019-10-15 DIAGNOSIS — E1169 Type 2 diabetes mellitus with other specified complication: Secondary | ICD-10-CM

## 2019-10-15 DIAGNOSIS — K219 Gastro-esophageal reflux disease without esophagitis: Secondary | ICD-10-CM

## 2019-10-15 DIAGNOSIS — I25119 Atherosclerotic heart disease of native coronary artery with unspecified angina pectoris: Secondary | ICD-10-CM

## 2019-10-15 DIAGNOSIS — M1990 Unspecified osteoarthritis, unspecified site: Secondary | ICD-10-CM

## 2019-10-15 MED ORDER — PANTOPRAZOLE SODIUM 40 MG PO TBEC: 40.0000 mg | DELAYED_RELEASE_TABLET | Freq: Every day | ORAL | 3 refills | Status: DC

## 2019-10-15 MED ORDER — DICLOFENAC SODIUM 75 MG PO TBEC: 75.0000 mg | DELAYED_RELEASE_TABLET | Freq: Two times a day (BID) | ORAL | 1 refills | Status: DC

## 2019-10-15 MED ORDER — ONDANSETRON 4 MG PO TBDP: 4.0000 mg | ORAL_TABLET | Freq: Three times a day (TID) | ORAL | 0 refills | Status: DC | PRN

## 2019-10-15 NOTE — Patient Instructions (Signed)
Follow up in 1 month Check CBC, CMP, TSH, Microalbuminuria today Leg elevation & compression stocking for leg swelling

## 2019-10-15 NOTE — Progress Notes (Addendum)
New Patient Office Visit  Subjective:  Patient ID: [name], female    DOB: [birth year]  Age: 74 y.o. MRN: [id number]  CC:  Chief Complaint  Patient presents with  . New Patient (Initial Visit)    establish care    HPI [name] is a very pleasant 74 year old female with past medical history of mesenteric ischemia both iliac to SMA bypass at [facility], status post right hemicolectomy due to bowel ischemia, coronary artery disease, hypertension, hyperlipidemia, hypothyroidism, type 2 diabetes mellitus, depression with anxiety, obesity, arthritis, chronic back pain presents to our clinic with her granddaughter for the first time to establish care with [place].  Patient recently hospitalized on 09/29/2019 with colitis.  Patient started on Cipro and Flagyl in the hospital.  C. difficile and GI pathogen: Were obtained and came back negative.  Patient discharged home in stable condition on 10/02/2019 on Cipro and Flagyl for 7 more days with recommendation to follow-up with PCP and repeat of labs.  Patient tells me that overall she is feeling okay, has constantly burping and bloating.  Taking PPI along with Tums and Zofran with little to no help.  She denies decreased appetite, epigastric burning, weight loss, night sweats, melena, hematemesis.  Complaining of bilateral leg swelling and exertional dyspnea however denies orthopnea or PND or chest pain.  She takes torsemide 40 mg as needed at home.  She has type 2 diabetes which is very well controlled on diet.  Have not seen ophthalmology for long time.  She denies ulcers or wound in her feet.  Has chronic back pain and arthritis for which she takes diclofenac and requested for refills.  Has depression/anxiety and takes multiple antidepressant and antianxiety medication at home including Xanax and Ambien which was prescribed by her previous PCP.  Refused to see psychiatrist at this time.  Reports depressed mood due to recent death of her  younger sister however denies suicidal or homicidal thoughts.  She lives alone at home and reports that sometimes she forgot to take her medicines.  Her daughter and son lives close by and takes care of patient.  No history of smoking, alcohol, illicit drug use.  She has coronary artery disease and followed by cardiology outpatient and has scheduled appointment next week.  She has hypothyroidism but unsure if she takes her levothyroxine at home or not.  Past Medical History:  Diagnosis Date  . Acute ischemic colitis ([place]) 09/11/2005  . Anxiety   . Arthritis   . Atherosclerotic vascular disease    Calcified plaque at the origin of the celiac and SMA  . CHF (congestive heart failure) ([place])   . Coronary atherosclerosis of native coronary artery    Mulitvessel LVEF 50-50%, DES circ 1/11, occluded SVG to OM and SVG to diagonal , LVEF 60%  . Depression   . Diverticulosis of colon   . Essential hypertension   . Hypothyroidism   . Mesenteric ischemia ([place])   . Mitral regurgitation    Mild to moderate  . Mixed hyperlipidemia   . Myocardial infarction ([place]) 1990  . Obesity   . Orthostatic hypotension   . PAD (peripheral artery disease) ([place])   . PVC's (premature ventricular contractions)   . Schizophrenia ([place])   . Sleep apnea    CPAP  . Tubular adenoma   . Type 2 diabetes mellitus ([place])     Past Surgical History:  Procedure Laterality Date  . BACK SURGERY     Lumbar spine surgery  .  Carotid endarectomy Bilateral   . CARPAL TUNNEL RELEASE     Bilateral  . CATARACT EXTRACTION W/PHACO Right 08/24/2013   Procedure: CATARACT EXTRACTION PHACO AND INTRAOCULAR LENS PLACEMENT (IOC);  Surgeon: Tonny Branch, MD;  Location: [facility];  Service: Ophthalmology;  Laterality: Right;  CDE 8.68  . COLONOSCOPY  09/14/2005   Dr. Leonard Schwartz colitis  . COLONOSCOPY WITH ESOPHAGOGASTRODUODENOSCOPY (EGD)  04/14/2012   Dr. Gala Romney- EGD= gastric erosions of doubtful clinical significance per bx- chronic  inactive gastritis, benign small bowel mucosa. TCS=colonic diverticulosis, tubular adenoma  . CORONARY ARTERY BYPASS GRAFT     LIMA-LAD; SVG-OM; SVG-DX in 1995 [place]  . NECK SURGERY     Cervical laminectomy  . PARTIAL HYSTERECTOMY    . stents x4      Family History  Problem Relation Age of Onset  . Alcohol abuse Mother   . Alcohol abuse Father   . Coronary artery disease Other   . Hypertension Other   . Crohn's disease Sister 63  . Stroke Daughter     Social History   Socioeconomic History  . Marital status: Widowed    Spouse name: Not on file  . Number of children: 4  . Years of education: Not on file  . Highest education level: Not on file  Occupational History  . Occupation: DISABLED    Employer: UNEMPLOYED  Tobacco Use  . Smoking status: Former Smoker    Packs/day: 0.50    Years: 50.00    Pack years: 25.00    Types: Cigarettes    Start date: 12/30/2018  . Smokeless tobacco: Never Used  Vaping Use  . Vaping Use: Never used  Substance and Sexual Activity  . Alcohol use: No    Alcohol/week: 0.0 standard drinks    Comment: Rare etoh 3-4 drinks per yr, hx beer heavily for 10-12 yrs, quit heavy etoh 1990  . Drug use: No  . Sexual activity: Not on file  Other Topics Concern  . Not on file  Social History Narrative   LIves alone   Social Determinants of Health   Financial Resource Strain:   . Difficulty of Paying Living Expenses:   Food Insecurity:   . Worried About Charity fundraiser in the Last Year:   . Arboriculturist in the Last Year:   Transportation Needs:   . Film/video editor (Medical):   Marland Kitchen Lack of Transportation (Non-Medical):   Physical Activity:   . Days of Exercise per Week:   . Minutes of Exercise per Session:   Stress:   . Feeling of Stress :   Social Connections:   . Frequency of Communication with Friends and Family:   . Frequency of Social Gatherings with Friends and Family:   . Attends Religious Services:   . Active Member of  Clubs or Organizations:   . Attends Archivist Meetings:   Marland Kitchen Marital Status:   Intimate Partner Violence:   . Fear of Current or Ex-Partner:   . Emotionally Abused:   Marland Kitchen Physically Abused:   . Sexually Abused:     ROS Review of Systems  Constitutional: Positive for fatigue.  HENT: Negative.   Eyes: Negative.   Respiratory: Positive for shortness of breath.   Cardiovascular: Positive for leg swelling.  Gastrointestinal: Positive for nausea.       Positive for bloating and burping  Endocrine: Negative.   Genitourinary: Negative.   Musculoskeletal: Positive for back pain.  Allergic/Immunologic: Negative.   Neurological: Positive for  dizziness.  Hematological: Negative.   Psychiatric/Behavioral: Negative.     Objective:   Today's Vitals: BP 123/75   Pulse 87   Temp 97.9 F (36.6 C) (Temporal)   Resp 15   Ht 5\' 3"  (1.6 m)   Wt 232 lb 1.9 oz (105.3 kg)   SpO2 94%   BMI 41.12 kg/m   Physical Exam Constitutional:      Appearance: Normal appearance. She is obese.  HENT:     Head: Normocephalic and atraumatic.     Nose: Nose normal.     Mouth/Throat:     Mouth: Mucous membranes are moist.  Eyes:     Extraocular Movements: Extraocular movements intact.     Conjunctiva/sclera: Conjunctivae normal.     Pupils: Pupils are equal, round, and reactive to light.  Cardiovascular:     Rate and Rhythm: Normal rate and regular rhythm.     Pulses: Normal pulses.     Heart sounds: Normal heart sounds.     Comments: Bilateral 2+ pitting edema positive in lower extremities Pulmonary:     Effort: Pulmonary effort is normal.     Breath sounds: Normal breath sounds.  Abdominal:     General: Bowel sounds are normal.     Palpations: Abdomen is soft.  Musculoskeletal:     Cervical back: Normal range of motion and neck supple.  Neurological:     General: No focal deficit present.     Mental Status: She is alert and oriented to person, place, and time. Mental status is at  baseline.  Psychiatric:        Mood and Affect: Mood normal.        Behavior: Behavior normal.        Thought Content: Thought content normal.     Assessment & Plan:   Problem List Items Addressed This Visit      Digestive   GERD (gastroesophageal reflux disease)   Relevant Medications   simethicone (MYLICON) 195 MG chewable tablet   pantoprazole (PROTONIX) 40 MG tablet   ondansetron (ZOFRAN-ODT) 4 MG disintegrating tablet    Other Visit Diagnoses    Hospital discharge follow-up    -  Primary   Bloating       Coronary artery disease involving native heart with angina pectoris, unspecified vessel or lesion type (HCC)       Relevant Orders   CBC   COMPLETE METABOLIC PANEL WITH GFR   Type 2 diabetes mellitus with other specified complication, without long-term current use of insulin (HCC)       Relevant Orders   Microalbumin, urine   Essential hypertension       Relevant Orders   CBC   COMPLETE METABOLIC PANEL WITH GFR   TSH   Arthritis       Relevant Medications   diclofenac (VOLTAREN) 75 MG EC tablet   Bilateral leg edema       Relevant Orders   COMPLETE METABOLIC PANEL WITH GFR   Depression with anxiety       Relevant Medications   ALPRAZolam (XANAX) 0.5 MG tablet   Encounter to establish care       HTN (hypertension), benign         Encounter to establish care: Care established  Hospitalization discharge follow-up: -Patient recently hospitalized due to colitis.  Doing well however cont. to have burping and bloating. -Discussed about dietary modification-including low spicy food, continue PPI once daily, Zofran as needed for nausea.  Encouraged to take Tums. -  Check labs such as CBC, CMP.  Bilateral leg edema: -Advised leg elevation, compression stockings.  Advised to take torsemide 40 mg daily for 3 days and then as needed.  Discussed about low-sodium diet.  Hypothyroidism: Check TSH -Continue levothyroxine  Depression/anxiety: Continue Wellbutrin, Xanax  and Ambien as needed. -Patient refused to see psychiatrist at this time.  She appears anxious and tells me that she is stressed out due to recent death in family.  No suicidal or homicidal thoughts -Continue same medicines for now  Arthritis/chronic back pain: -Continue diclofenac as needed  Coronary artery disease: -Continue aspirin, statin, metoprolol, Imdur, Plavix. -Has a scheduled appointment with cardiology outpatient  Diabetes mellitus: Well controlled. -Last A1c: 6.6% on 09/29/2019-she tells me that her Metformin was recently discontinued. -Discussed to get an appointment with ophthalmology. -Will do foot exam on next visit  Prevention medicine: -Refused COVID-19 vaccine. -We we will refer for DEXA on next visit  Hyperlipidemia: Continue statin  Mesenteric ischemia: -Continue aspirin and Plavix  GERD: Continue PPI-refills given  COPD: Stable.  Continue Spiriva  Hypertension: Stable.  Continue amlodipine and metoprolol  Outpatient Encounter Medications as of 10/15/2019  Medication Sig  . ALPRAZolam (XANAX) 0.5 MG tablet Take 0.5 mg by mouth in the morning, at noon, in the evening, and at bedtime.  Marland Kitchen aspirin EC 81 MG tablet Take 81 mg by mouth daily.  Marland Kitchen buPROPion (WELLBUTRIN XL) 150 MG 24 hr tablet Take 150 mg by mouth in the morning and at bedtime.  . clopidogrel (PLAVIX) 75 MG tablet Take 75 mg by mouth daily.    . diclofenac (VOLTAREN) 75 MG EC tablet Take 1 tablet (75 mg total) by mouth 2 (two) times daily.  Marland Kitchen gabapentin (NEURONTIN) 300 MG capsule Take 300 mg by mouth 2 (two) times daily.  . isosorbide mononitrate (IMDUR) 60 MG 24 hr tablet Take 60 mg by mouth daily.   Marland Kitchen levothyroxine (SYNTHROID) 25 MCG tablet Take 1 tablet by mouth daily.  . metoprolol tartrate (LOPRESSOR) 25 MG tablet Take 0.5 tablets (12.5 mg total) by mouth 2 (two) times daily.  . nitroGLYCERIN (NITROSTAT) 0.4 MG SL tablet Place 1 tablet (0.4 mg total) under the tongue every 5 (five) minutes as  needed.  . ondansetron (ZOFRAN-ODT) 4 MG disintegrating tablet Take 1 tablet (4 mg total) by mouth every 8 (eight) hours as needed for nausea or vomiting.  . pantoprazole (PROTONIX) 40 MG tablet Take 1 tablet (40 mg total) by mouth daily.  . polyethylene glycol (MIRALAX / GLYCOLAX) 17 g packet Take 17 g by mouth daily.  . pravastatin (PRAVACHOL) 80 MG tablet Take 80 mg by mouth daily.    . simethicone (MYLICON) 884 MG chewable tablet Chew 125 mg by mouth every 6 (six) hours as needed for flatulence.  Marland Kitchen SPIRIVA HANDIHALER 18 MCG inhalation capsule 1 capsule daily.  . Tiotropium Bromide-Olodaterol (STIOLTO RESPIMAT) 2.5-2.5 MCG/ACT AERS Inhale 2 puffs into the lungs daily.  Marland Kitchen torsemide (DEMADEX) 20 MG tablet Take 2 tablets (40 mg total) by mouth as needed.  . zolpidem (AMBIEN) 10 MG tablet Take 10 mg by mouth at bedtime as needed for sleep.  . [DISCONTINUED] diclofenac (VOLTAREN) 75 MG EC tablet Take 75 mg by mouth 2 (two) times daily.  . [DISCONTINUED] ondansetron (ZOFRAN-ODT) 4 MG disintegrating tablet Take 1 tablet (4 mg total) by mouth every 8 (eight) hours as needed for nausea or vomiting.  . [DISCONTINUED] pantoprazole (PROTONIX) 40 MG tablet Take 40 mg by mouth daily.  Marland Kitchen amLODipine (  NORVASC) 5 MG tablet Take 5 mg by mouth daily. (Patient not taking: Reported on 10/15/2019)  . [DISCONTINUED] ciprofloxacin (CIPRO) 500 MG tablet Take 1 tablet (500 mg total) by mouth 2 (two) times daily. (Patient not taking: Reported on 10/15/2019)  . [DISCONTINUED] gabapentin (NEURONTIN) 300 MG capsule Take 300 mg by mouth 2 (two) times daily. (Patient not taking: Reported on 10/15/2019)  . [DISCONTINUED] metFORMIN (GLUCOPHAGE) 500 MG tablet Take 500 mg by mouth 2 (two) times daily. (Patient not taking: Reported on 10/15/2019)  . [DISCONTINUED] metroNIDAZOLE (FLAGYL) 500 MG tablet Take 1 tablet (500 mg total) by mouth every 8 (eight) hours. (Patient not taking: Reported on 10/15/2019)  . [DISCONTINUED]  oxyCODONE-acetaminophen (PERCOCET/ROXICET) 5-325 MG tablet Take 1 tablet by mouth every 4 (four) hours as needed for severe pain. (Patient not taking: Reported on 10/15/2019)   No facility-administered encounter medications on file as of 10/15/2019.    Follow-up: Return in about 1 month (around 11/14/2019).   Mckinley Jewel, MD

## 2019-10-16 LAB — COMPLETE METABOLIC PANEL WITH GFR
AG Ratio: 1.2 (calc) (ref 1.0–2.5)
ALT: 12 U/L (ref 6–29)
AST: 21 U/L (ref 10–35)
Albumin: 3.2 g/dL — ABNORMAL LOW (ref 3.6–5.1)
Alkaline phosphatase (APISO): 97 U/L (ref 37–153)
BUN/Creatinine Ratio: 14 (calc) (ref 6–22)
BUN: 15 mg/dL (ref 7–25)
CO2: 28 mmol/L (ref 20–32)
Calcium: 8.2 mg/dL — ABNORMAL LOW (ref 8.6–10.4)
Chloride: 106 mmol/L (ref 98–110)
Creat: 1.04 mg/dL — ABNORMAL HIGH (ref 0.60–0.93)
GFR, Est African American: 61 mL/min/{1.73_m2} (ref >=60)
GFR, Est Non African American: 53 mL/min/{1.73_m2} — ABNORMAL LOW (ref >=60)
Globulin: 2.6 g/dL (calc) (ref 1.9–3.7)
Glucose, Bld: 104 mg/dL (ref 65–139)
Potassium: 4.5 mmol/L (ref 3.5–5.3)
Sodium: 138 mmol/L (ref 135–146)
Total Bilirubin: 0.3 mg/dL (ref 0.2–1.2)
Total Protein: 5.8 g/dL — ABNORMAL LOW (ref 6.1–8.1)

## 2019-10-16 LAB — TSH: TSH: 8.55 mIU/L — ABNORMAL HIGH (ref 0.40–4.50)

## 2019-10-16 LAB — MICROALBUMIN, URINE: Microalb, Ur: 0.3 mg/dL

## 2019-10-16 LAB — CBC
HCT: 29 % — ABNORMAL LOW (ref 35.0–45.0)
Hemoglobin: 9 g/dL — ABNORMAL LOW (ref 11.7–15.5)
MCH: 30.2 pg (ref 27.0–33.0)
MCHC: 31 g/dL — ABNORMAL LOW (ref 32.0–36.0)
MCV: 97.3 fL (ref 80.0–100.0)
MPV: 9.7 fL (ref 7.5–12.5)
Platelets: 255 10*3/uL (ref 140–400)
RBC: 2.98 10*6/uL — ABNORMAL LOW (ref 3.80–5.10)
RDW: 13.9 % (ref 11.0–15.0)
WBC: 7.4 10*3/uL (ref 3.8–10.8)

## 2019-10-21 ENCOUNTER — Encounter: Payer: Self-pay | Admitting: Cardiology

## 2019-10-21 ENCOUNTER — Other Ambulatory Visit: Payer: Self-pay

## 2019-10-21 ENCOUNTER — Ambulatory Visit: Payer: Medicare Other | Admitting: Cardiology

## 2019-10-21 VITALS — BP 126/70 | HR 62 | Ht 63.0 in | Wt 223.8 lb

## 2019-10-21 DIAGNOSIS — I25119 Atherosclerotic heart disease of native coronary artery with unspecified angina pectoris: Secondary | ICD-10-CM | POA: Diagnosis not present

## 2019-10-21 DIAGNOSIS — I4891 Unspecified atrial fibrillation: Secondary | ICD-10-CM

## 2019-10-21 DIAGNOSIS — I9789 Other postprocedural complications and disorders of the circulatory system, not elsewhere classified: Secondary | ICD-10-CM | POA: Diagnosis not present

## 2019-10-21 NOTE — Progress Notes (Signed)
Cardiology Office Note  Date: 10/21/2019   ID: Roanne, Haye January 29, 1946, MRN [id number]  PCP:  Mckinley Jewel, MD  Cardiologist:  Rozann Lesches, MD Electrophysiologist:  None   Chief Complaint  Patient presents with  . Cardiac follow-up    History of Present Illness: [name] is a 74 y.o. female last seen in March.  She presents for a routine follow-up visit.  I reviewed interval records.  She was recently hospitalized with worsening abdominal pain, nausea and emesis.  She has a history of critical mesenteric ischemia for which she required right hemicolectomy and right iliac to SMA bypass performed at [facility] 06/13/2019.  She was diagnosed recently with ischemic colitis and bowel rest and broad-spectrum antibiotics with clinical improvement.  No obvious exacerbation of cardiac status per review of the discharge summary. She recently established with Dr. Doristine Bosworth for primary care, I reviewed her note.  From a cardiac perspective, she does report intermittent dyspnea on exertion and sense of palpitations, but no frank angina symptoms.  Recent ECG in June showed sinus rhythm.  I reviewed her cardiac medications which are outlined below.  She remains on dual antiplatelet therapy, no reported bleeding problems.  Past Medical History:  Diagnosis Date  . Acute ischemic colitis ([place]) 09/11/2005  . Anxiety   . Arthritis   . Atherosclerotic vascular disease    Calcified plaque at the origin of the celiac and SMA  . CHF (congestive heart failure) ([place])   . Coronary atherosclerosis of native coronary artery    Mulitvessel LVEF 50-50%, DES circ 1/11, occluded SVG to OM and SVG to diagonal , LVEF 60%  . Depression   . Diverticulosis of colon   . Essential hypertension   . Hypothyroidism   . Mesenteric ischemia ([place])   . Mitral regurgitation    Mild to moderate  . Mixed hyperlipidemia   . Myocardial infarction ([place]) 1990  . Obesity   . Orthostatic hypotension   .  PAD (peripheral artery disease) ([place])   . PVC's (premature ventricular contractions)   . Schizophrenia ([place])   . Sleep apnea    CPAP  . Tubular adenoma   . Type 2 diabetes mellitus ([place])     Past Surgical History:  Procedure Laterality Date  . BACK SURGERY     Lumbar spine surgery  . Carotid endarectomy Bilateral   . CARPAL TUNNEL RELEASE     Bilateral  . CATARACT EXTRACTION W/PHACO Right 08/24/2013   Procedure: CATARACT EXTRACTION PHACO AND INTRAOCULAR LENS PLACEMENT (IOC);  Surgeon: Tonny Branch, MD;  Location: [facility];  Service: Ophthalmology;  Laterality: Right;  CDE 8.68  . COLONOSCOPY  09/14/2005   Dr. Leonard Schwartz colitis  . COLONOSCOPY WITH ESOPHAGOGASTRODUODENOSCOPY (EGD)  04/14/2012   Dr. Gala Romney- EGD= gastric erosions of doubtful clinical significance per bx- chronic inactive gastritis, benign small bowel mucosa. TCS=colonic diverticulosis, tubular adenoma  . CORONARY ARTERY BYPASS GRAFT     LIMA-LAD; SVG-OM; SVG-DX in 1995 [place]  . NECK SURGERY     Cervical laminectomy  . PARTIAL HYSTERECTOMY    . stents x4      Current Outpatient Medications  Medication Sig Dispense Refill  . ALPRAZolam (XANAX) 0.5 MG tablet Take 0.5 mg by mouth in the morning, at noon, in the evening, and at bedtime.    Marland Kitchen aspirin EC 81 MG tablet Take 81 mg by mouth daily.    Marland Kitchen buPROPion (WELLBUTRIN XL) 150 MG 24 hr tablet Take 150 mg by mouth  in the morning and at bedtime.    . clopidogrel (PLAVIX) 75 MG tablet Take 75 mg by mouth daily.      . diclofenac (VOLTAREN) 75 MG EC tablet Take 1 tablet (75 mg total) by mouth 2 (two) times daily. 30 tablet 1  . gabapentin (NEURONTIN) 300 MG capsule Take 300 mg by mouth 2 (two) times daily.    . isosorbide mononitrate (IMDUR) 60 MG 24 hr tablet Take 60 mg by mouth daily.     Marland Kitchen levothyroxine (SYNTHROID) 25 MCG tablet Take 1 tablet by mouth daily.    . metoprolol tartrate (LOPRESSOR) 25 MG tablet Take 0.5 tablets (12.5 mg total) by mouth 2 (two) times daily. 90  tablet 3  . nitroGLYCERIN (NITROSTAT) 0.4 MG SL tablet Place 1 tablet (0.4 mg total) under the tongue every 5 (five) minutes as needed. 90 tablet 3  . ondansetron (ZOFRAN-ODT) 4 MG disintegrating tablet Take 1 tablet (4 mg total) by mouth every 8 (eight) hours as needed for nausea or vomiting. 20 tablet 0  . pantoprazole (PROTONIX) 40 MG tablet Take 1 tablet (40 mg total) by mouth daily. 30 tablet 3  . polyethylene glycol (MIRALAX / GLYCOLAX) 17 g packet Take 17 g by mouth daily.    . pravastatin (PRAVACHOL) 80 MG tablet Take 80 mg by mouth daily.      . simethicone (MYLICON) 496 MG chewable tablet Chew 125 mg by mouth every 6 (six) hours as needed for flatulence.    Marland Kitchen SPIRIVA HANDIHALER 18 MCG inhalation capsule 1 capsule daily.    . Tiotropium Bromide-Olodaterol (STIOLTO RESPIMAT) 2.5-2.5 MCG/ACT AERS Inhale 2 puffs into the lungs daily. 1 Inhaler 0  . torsemide (DEMADEX) 20 MG tablet Take 2 tablets (40 mg total) by mouth as needed. 30 tablet 0  . zolpidem (AMBIEN) 10 MG tablet Take 10 mg by mouth at bedtime as needed for sleep.     No current facility-administered medications for this visit.   Allergies:  Patient has no known allergies.   ROS:   No dizziness or syncope.  Physical Exam: VS:  BP 126/70   Pulse 62   Ht 5\' 3"  (1.6 m)   Wt 223 lb 12.8 oz (101.5 kg)   SpO2 96%   BMI 39.64 kg/m , BMI Body mass index is 39.64 kg/m.  Wt Readings from Last 3 Encounters:  10/21/19 223 lb 12.8 oz (101.5 kg)  10/15/19 232 lb 1.9 oz (105.3 kg)  09/29/19 233 lb 11 oz (106 kg)    General: Patient appears comfortable at rest. HEENT: Conjunctiva and lids normal, wearing a mask. Neck: Supple, no elevated JVP or carotid bruits, no thyromegaly. Lungs: Clear to auscultation, nonlabored breathing at rest. Cardiac: Regular rate and rhythm, no S3, soft systolic murmur, no pericardial rub. Extremities: No pitting edema, distal pulses 2+.  ECG:  An ECG dated 09/29/2019 was personally reviewed today and  demonstrated:  Probable sinus rhythm with low voltage and decreased R wave progression anteriorly.  Recent Labwork: 10/02/2019: Magnesium 1.6 10/15/2019: ALT 12; AST 21; BUN 15; Creat 1.04; Hemoglobin 9.0; Platelets 255; Potassium 4.5; Sodium 138; TSH 8.55     Component Value Date/Time   CHOL 142 08/19/2017 1505   TRIG 154 (H) 08/19/2017 1505   HDL 45 08/19/2017 1505   CHOLHDL 3.2 08/19/2017 1505   LDLCALC 66 08/19/2017 1505    Other Studies Reviewed Today:  Echocardiogram 06/15/2019([facility]): SUMMARY The left ventricular size is normal. Mild left ventricular hypertrophy LV ejection fraction =  55-60%. Left ventricular systolic function is normal. No segmental wall motion abnormalities seen in the left ventricle The right ventricle is normal in size and function. The left atrium is mildly dilated. There is no significant valvular stenosis or regurgitation. There was insufficient TR detected to calculate RV systolic pressure. There is no pericardial effusion. The inferior vena cava was not visualized during the exam. Probably no significant change in comparison with the prior study noted  Assessment and Plan:  1.  Multivessel CAD status post CABG as well as DES to the circumflex, known graft disease with occluded SVG to OM and SVG to diagonal.  She reports no active angina at this time, but intermittent dyspnea on exertion.  Continue aspirin, Plavix, Imdur, Lopressor, and Pravachol.  2.  Postoperative atrial fibrillation in February following surgery at [facility].  No obvious interval atrial fibrillation, already taken off amiodarone and not anticoagulated as yet.  Continue to follow.  ECG from June noted above.  Medication Adjustments/Labs and Tests Ordered: Current medicines are reviewed at length with the patient today.  Concerns regarding medicines are outlined above.   Tests Ordered: No orders of the defined types were placed in this encounter.   Medication Changes: No  orders of the defined types were placed in this encounter.   Disposition:  Follow up 6 months in the [place] office.  Signed, Satira Sark, MD, [facility] 10/21/2019 5:04 PM    [facility] [address], [place], [place] [address] Phone: [phone]; Fax: [phone]

## 2019-10-21 NOTE — Patient Instructions (Addendum)
Medication Instructions:  Continue all current medications.   Labwork: none  Testing/Procedures: none  Follow-Up: Your physician recommends that you schedule a follow-up appointment in:  6 months   Any Other Special Instructions Will Be Listed Below (If Applicable).  If you need a refill on your cardiac medications before your next appointment, please call your pharmacy. 

## 2019-10-23 ENCOUNTER — Ambulatory Visit: Payer: Medicare Other | Admitting: Cardiology

## 2019-10-28 ENCOUNTER — Other Ambulatory Visit: Payer: Self-pay | Admitting: Cardiology

## 2019-10-28 ENCOUNTER — Ambulatory Visit (INDEPENDENT_AMBULATORY_CARE_PROVIDER_SITE_OTHER): Payer: Medicare Other

## 2019-10-28 DIAGNOSIS — G4733 Obstructive sleep apnea (adult) (pediatric): Secondary | ICD-10-CM | POA: Diagnosis not present

## 2019-10-28 DIAGNOSIS — I6523 Occlusion and stenosis of bilateral carotid arteries: Secondary | ICD-10-CM

## 2019-10-28 DIAGNOSIS — R0902 Hypoxemia: Secondary | ICD-10-CM | POA: Diagnosis not present

## 2019-11-03 ENCOUNTER — Telehealth: Payer: Self-pay | Admitting: *Deleted

## 2019-11-03 DIAGNOSIS — I6523 Occlusion and stenosis of bilateral carotid arteries: Secondary | ICD-10-CM

## 2019-11-03 NOTE — Telephone Encounter (Signed)
Patient's granddaughter Higinio Roger informed. Copy sent to PCP

## 2019-11-03 NOTE — Telephone Encounter (Signed)
-----   Message from Satira Sark, MD sent at 10/29/2019 12:34 PM EDT ----- Results reviewed.  Carotid Dopplers showed mild to moderate ICA atherosclerosis.  Continue to follow, arrange repeat carotid Doppler in 12 months.

## 2019-11-12 ENCOUNTER — Ambulatory Visit: Payer: Medicare Other | Admitting: Internal Medicine

## 2019-11-12 ENCOUNTER — Telehealth: Payer: Self-pay | Admitting: Internal Medicine

## 2019-11-12 NOTE — Telephone Encounter (Signed)
Spoke with patients granddaughter and she stated that she couldn't remember the name of the medication and she wasn't at the patients home. She stated that her grandmother said the medication had a high risk of causing heart attacks and she didn't want to take it. I told her I couldn't tell her anything if she couldn't tell me the medication. She said Dr.Pahwani prescribed the medication. I called out the 3 medications Dr.Pahwani prescribed and she said she thinks it is the diclofenac and I stated she can make a decision on whether or not she takes it. It is a NSAID to help with pain. She verbalized understanding

## 2019-11-12 NOTE — Telephone Encounter (Signed)
Patients granddaughter called about a recent medication and is concerned about a recent medication she received but is unsure which medication it is.

## 2019-11-26 ENCOUNTER — Encounter: Payer: Self-pay | Admitting: Internal Medicine

## 2019-11-26 ENCOUNTER — Other Ambulatory Visit: Payer: Self-pay

## 2019-11-26 ENCOUNTER — Ambulatory Visit (INDEPENDENT_AMBULATORY_CARE_PROVIDER_SITE_OTHER): Payer: Medicare Other | Admitting: Internal Medicine

## 2019-11-26 ENCOUNTER — Telehealth: Payer: Self-pay | Admitting: Internal Medicine

## 2019-11-26 VITALS — BP 160/62 | HR 63 | Resp 16 | Ht 63.0 in | Wt 226.1 lb

## 2019-11-26 DIAGNOSIS — E039 Hypothyroidism, unspecified: Secondary | ICD-10-CM | POA: Diagnosis not present

## 2019-11-26 DIAGNOSIS — I4891 Unspecified atrial fibrillation: Secondary | ICD-10-CM

## 2019-11-26 DIAGNOSIS — K219 Gastro-esophageal reflux disease without esophagitis: Secondary | ICD-10-CM

## 2019-11-26 DIAGNOSIS — N179 Acute kidney failure, unspecified: Secondary | ICD-10-CM

## 2019-11-26 DIAGNOSIS — I2510 Atherosclerotic heart disease of native coronary artery without angina pectoris: Secondary | ICD-10-CM

## 2019-11-26 DIAGNOSIS — I10 Essential (primary) hypertension: Secondary | ICD-10-CM | POA: Diagnosis not present

## 2019-11-26 MED ORDER — PRAVASTATIN SODIUM 80 MG PO TABS: 80.0000 mg | ORAL_TABLET | Freq: Every day | ORAL | 1 refills | Status: DC

## 2019-11-26 MED ORDER — PANTOPRAZOLE SODIUM 40 MG PO TBEC: 40.0000 mg | DELAYED_RELEASE_TABLET | Freq: Every day | ORAL | 1 refills | Status: DC

## 2019-11-26 MED ORDER — SIMETHICONE 125 MG PO CHEW: 125.0000 mg | CHEWABLE_TABLET | Freq: Four times a day (QID) | ORAL | 0 refills | Status: DC | PRN

## 2019-11-26 NOTE — Telephone Encounter (Signed)
Pt needing refill on Protonix and Pravastatin And also wondering why she has the Rx for Mylicon

## 2019-11-26 NOTE — Telephone Encounter (Signed)
Mylicon prescription sent to [facility] in [place] per request.

## 2019-11-26 NOTE — Telephone Encounter (Signed)
Medications refilled. Tried to return call. No answer, unable to leave vm because vm is full.

## 2019-11-26 NOTE — Patient Instructions (Signed)
Follow up in 3 months Diet modification for acid reflux Avoid Diclofenac sodium Take only tylenol Cont. Same BP meds for BP Take levothyroxine with empty stomach Leg elevation & compression stocking for leg swelling

## 2019-11-26 NOTE — Progress Notes (Signed)
Established Patient Office Visit  Subjective:  Patient ID: [name], female    DOB: 07/04/1945  Age: 74 y.o. MRN: [id number]  CC:  Chief Complaint  Patient presents with  . Hypertension    follow up  . Diabetes    HPI ADELEI [name] is very pleasant 74 year old female with past medical history of hypertension, hyperlipidemia, mesenteric ischemia status post right hemicolectomy, coronary artery disease, hypothyroidism, type 2 diabetes mellitus, depression with anxiety, obesity, arthritis, chronic back pain, COPD presents with her granddaughter to our clinic for follow-up on her chronic medical issues.  Patient tells me that she continues to have acid reflux especially at nighttime when she lies flat, chronic headache, chronic shortness of breath with exertion, chronic leg swelling, nausea, dizziness.  She denies chest pain, fever, chills, cough, congestion, syncope, head trauma, loss of consciousness, abdominal pain, urinary or bowel changes.  She lives alone and takes care of her self.  She uses cane for ambulation. She tells me that she tries to take medications on time however she is unsure about medication names. Refused home health help.  She has history of postoperative A. fib in February-she is off of amiodarone.  She only takes metoprolol.  She is not on anticoagulation.  She is followed by Dr. McDowell-cardiology outpatient.  Her amlodipine was also discontinued by her cardiology due to low blood pressure.  Her initial blood pressure this morning was in 160s over 60s however repeated manually was 120/68.  Past Medical History:  Diagnosis Date  . Acute ischemic colitis ([place]) 09/11/2005  . Anxiety   . Arthritis   . Atherosclerotic vascular disease    Calcified plaque at the origin of the celiac and SMA  . CHF (congestive heart failure) ([place])   . Coronary atherosclerosis of native coronary artery    Mulitvessel LVEF 50-50%, DES circ 1/11, occluded SVG to OM and SVG  to diagonal , LVEF 60%  . Depression   . Diverticulosis of colon   . Essential hypertension   . Hypothyroidism   . Mesenteric ischemia ([place])   . Mitral regurgitation    Mild to moderate  . Mixed hyperlipidemia   . Myocardial infarction ([place]) 1990  . Obesity   . Orthostatic hypotension   . PAD (peripheral artery disease) ([place])   . PVC's (premature ventricular contractions)   . Schizophrenia ([place])   . Sleep apnea    CPAP  . Tubular adenoma   . Type 2 diabetes mellitus ([place])     Past Surgical History:  Procedure Laterality Date  . BACK SURGERY     Lumbar spine surgery  . Carotid endarectomy Bilateral   . CARPAL TUNNEL RELEASE     Bilateral  . CATARACT EXTRACTION W/PHACO Right 08/24/2013   Procedure: CATARACT EXTRACTION PHACO AND INTRAOCULAR LENS PLACEMENT (IOC);  Surgeon: Tonny Branch, MD;  Location: [facility];  Service: Ophthalmology;  Laterality: Right;  CDE 8.68  . COLONOSCOPY  09/14/2005   Dr. Leonard Schwartz colitis  . COLONOSCOPY WITH ESOPHAGOGASTRODUODENOSCOPY (EGD)  04/14/2012   Dr. Gala Romney- EGD= gastric erosions of doubtful clinical significance per bx- chronic inactive gastritis, benign small bowel mucosa. TCS=colonic diverticulosis, tubular adenoma  . CORONARY ARTERY BYPASS GRAFT     LIMA-LAD; SVG-OM; SVG-DX in 1995 [place]  . NECK SURGERY     Cervical laminectomy  . PARTIAL HYSTERECTOMY    . stents x4      Family History  Problem Relation Age of Onset  . Alcohol abuse Mother   .  Alcohol abuse Father   . Coronary artery disease Other   . Hypertension Other   . Crohn's disease Sister 82  . Stroke Daughter     Social History   Socioeconomic History  . Marital status: Widowed    Spouse name: Not on file  . Number of children: 4  . Years of education: Not on file  . Highest education level: Not on file  Occupational History  . Occupation: DISABLED    Employer: UNEMPLOYED  Tobacco Use  . Smoking status: Former Smoker    Packs/day: 0.50    Years: 50.00     Pack years: 25.00    Types: Cigarettes    Start date: 12/30/2018  . Smokeless tobacco: Never Used  Vaping Use  . Vaping Use: Never used  Substance and Sexual Activity  . Alcohol use: No    Alcohol/week: 0.0 standard drinks    Comment: Rare etoh 3-4 drinks per yr, hx beer heavily for 10-12 yrs, quit heavy etoh 1990  . Drug use: No  . Sexual activity: Not on file  Other Topics Concern  . Not on file  Social History Narrative   LIves alone   Social Determinants of Health   Financial Resource Strain:   . Difficulty of Paying Living Expenses:   Food Insecurity:   . Worried About Charity fundraiser in the Last Year:   . Arboriculturist in the Last Year:   Transportation Needs:   . Film/video editor (Medical):   Marland Kitchen Lack of Transportation (Non-Medical):   Physical Activity:   . Days of Exercise per Week:   . Minutes of Exercise per Session:   Stress:   . Feeling of Stress :   Social Connections:   . Frequency of Communication with Friends and Family:   . Frequency of Social Gatherings with Friends and Family:   . Attends Religious Services:   . Active Member of Clubs or Organizations:   . Attends Archivist Meetings:   Marland Kitchen Marital Status:   Intimate Partner Violence:   . Fear of Current or Ex-Partner:   . Emotionally Abused:   Marland Kitchen Physically Abused:   . Sexually Abused:     Outpatient Medications Prior to Visit  Medication Sig Dispense Refill  . ALPRAZolam (XANAX) 0.5 MG tablet Take 0.5 mg by mouth in the morning, at noon, in the evening, and at bedtime.    Marland Kitchen aspirin EC 81 MG tablet Take 81 mg by mouth daily.    Marland Kitchen buPROPion (WELLBUTRIN XL) 150 MG 24 hr tablet Take 150 mg by mouth in the morning and at bedtime.    . clopidogrel (PLAVIX) 75 MG tablet Take 75 mg by mouth daily.      . diclofenac (VOLTAREN) 75 MG EC tablet Take 1 tablet (75 mg total) by mouth 2 (two) times daily. 30 tablet 1  . gabapentin (NEURONTIN) 300 MG capsule Take 300 mg by mouth 2 (two) times  daily.    . isosorbide mononitrate (IMDUR) 60 MG 24 hr tablet Take 60 mg by mouth daily.     Marland Kitchen levothyroxine (SYNTHROID) 25 MCG tablet Take 1 tablet by mouth daily.    . metoprolol tartrate (LOPRESSOR) 25 MG tablet Take 0.5 tablets (12.5 mg total) by mouth 2 (two) times daily. 90 tablet 3  . nitroGLYCERIN (NITROSTAT) 0.4 MG SL tablet Place 1 tablet (0.4 mg total) under the tongue every 5 (five) minutes as needed. 90 tablet 3  . ondansetron (ZOFRAN-ODT)  4 MG disintegrating tablet Take 1 tablet (4 mg total) by mouth every 8 (eight) hours as needed for nausea or vomiting. 20 tablet 0  . pantoprazole (PROTONIX) 40 MG tablet Take 1 tablet (40 mg total) by mouth daily. 30 tablet 3  . polyethylene glycol (MIRALAX / GLYCOLAX) 17 g packet Take 17 g by mouth daily.    . pravastatin (PRAVACHOL) 80 MG tablet Take 80 mg by mouth daily.      . simethicone (MYLICON) 355 MG chewable tablet Chew 125 mg by mouth every 6 (six) hours as needed for flatulence.    Marland Kitchen SPIRIVA HANDIHALER 18 MCG inhalation capsule 1 capsule daily.    . Tiotropium Bromide-Olodaterol (STIOLTO RESPIMAT) 2.5-2.5 MCG/ACT AERS Inhale 2 puffs into the lungs daily. 1 Inhaler 0  . torsemide (DEMADEX) 20 MG tablet Take 2 tablets (40 mg total) by mouth as needed. 30 tablet 0  . zolpidem (AMBIEN) 10 MG tablet Take 10 mg by mouth at bedtime as needed for sleep.     No facility-administered medications prior to visit.    No Known Allergies  ROS Review of Systems  Constitutional: Negative.   HENT: Negative.   Eyes: Negative.   Respiratory: Negative.   Cardiovascular: Positive for leg swelling.  Gastrointestinal: Positive for nausea.  Endocrine: Negative.   Genitourinary: Negative.   Musculoskeletal: Positive for arthralgias.  Allergic/Immunologic: Negative.   Neurological: Positive for dizziness.  Hematological: Negative.   Psychiatric/Behavioral: Negative.       Objective:    Physical Exam Constitutional:      Appearance: Normal  appearance. She is obese.  HENT:     Head: Normocephalic and atraumatic.     Nose: Nose normal.     Mouth/Throat:     Mouth: Mucous membranes are moist.  Eyes:     Extraocular Movements: Extraocular movements intact.     Conjunctiva/sclera: Conjunctivae normal.     Pupils: Pupils are equal, round, and reactive to light.  Cardiovascular:     Rate and Rhythm: Normal rate and regular rhythm.     Pulses: Normal pulses.  Pulmonary:     Effort: Pulmonary effort is normal.     Breath sounds: Normal breath sounds.  Abdominal:     General: Bowel sounds are normal.     Palpations: Abdomen is soft.  Musculoskeletal:     Cervical back: Normal range of motion.     Right lower leg: Edema present.     Left lower leg: Edema present.  Neurological:     General: No focal deficit present.     Mental Status: She is alert and oriented to person, place, and time.  Psychiatric:        Mood and Affect: Mood normal.        Thought Content: Thought content normal.        Judgment: Judgment normal.     BP (!) 160/62   Pulse 63   Resp 16   Ht 5\' 3"  (1.6 m)   Wt (!) 226 lb 1.9 oz (102.6 kg)   SpO2 96%   BMI 40.06 kg/m  Wt Readings from Last 3 Encounters:  11/26/19 (!) 226 lb 1.9 oz (102.6 kg)  10/21/19 223 lb 12.8 oz (101.5 kg)  10/15/19 232 lb 1.9 oz (105.3 kg)     Health Maintenance Due  Topic Date Due  . Hepatitis C Screening  Never done  . FOOT EXAM  Never done  . OPHTHALMOLOGY EXAM  Never done  . COVID-19 Vaccine (1)  Never done  . TETANUS/TDAP  Never done  . DEXA SCAN  Never done  . PNA vac Low Risk Adult (1 of 2 - PCV13) Never done    There are no preventive care reminders to display for this patient.  Lab Results  Component Value Date   TSH 8.55 (H) 10/15/2019   Lab Results  Component Value Date   WBC 7.4 10/15/2019   HGB 9.0 (L) 10/15/2019   HCT 29.0 (L) 10/15/2019   MCV 97.3 10/15/2019   PLT 255 10/15/2019   Lab Results  Component Value Date   NA 138 10/15/2019     K 4.5 10/15/2019   CO2 28 10/15/2019   GLUCOSE 104 10/15/2019   BUN 15 10/15/2019   CREATININE 1.04 (H) 10/15/2019   BILITOT 0.3 10/15/2019   ALKPHOS 59 09/30/2019   AST 21 10/15/2019   ALT 12 10/15/2019   PROT 5.8 (L) 10/15/2019   ALBUMIN 2.2 (L) 09/30/2019   CALCIUM 8.2 (L) 10/15/2019   ANIONGAP 5 10/02/2019   Lab Results  Component Value Date   CHOL 142 08/19/2017   Lab Results  Component Value Date   HDL 45 08/19/2017   Lab Results  Component Value Date   LDLCALC 66 08/19/2017   Lab Results  Component Value Date   TRIG 154 (H) 08/19/2017   Lab Results  Component Value Date   CHOLHDL 3.2 08/19/2017   Lab Results  Component Value Date   HGBA1C 6.6 (H) 09/29/2019      Assessment & Plan:   Problem List Items Addressed This Visit      Digestive   GERD (gastroesophageal reflux disease)     Endocrine   Hypothyroidism    Other Visit Diagnoses    Essential hypertension    -  Primary   AKI (acute kidney injury) ([employer])       Coronary artery disease involving native coronary artery of native heart without angina pectoris       Atrial fibrillation, unspecified type ([place])         Hypertension: Initial blood pressure was 160/62.  Repeated manually was 120/68. -Continue metoprolol.  Hypothyroidism: Continue levothyroxine. -Repeat TSH on next visit. -Discussed to take levothyroxine early in the morning with empty stomach.  AKI: Noted on recent labs. -Likely secondary to NSAID use -Advised to avoid diclofenac sodium.  Take Tylenol for arthritic pain.  Postprocedural A. fib: Patient is off of amiodarone.  Continue metoprolol.  She is not on anticoagulation.  She is followed by Dr. Domenic Polite cardiology outpatient.  Coronary artery disease: Status post CABG/DES: Stable -Patient denies ACS symptoms. -Continue aspirin, statin, Lopressor, Plavix and Imdur.  Hypothyroidism: Last TSH elevated.  Patient was advised to take levothyroxine early in the morning with  empty stomach.  Follow-up in 3 months.  We will repeat TSH on next visit.  GERD: Patient continues to have burping especially at nighttime. -I discussed patient with dietary modification/lifestyle modification-including but not limited to avoid spicy food.  Do not eat anything at least 3 hours prior to go to bed.  Continue PPI. -Follow-up in 3 months.  Type 2 diabetes mellitus: Very well controlled.  Last A1c 6.6% on 09/29/2019. -Her Metformin was recently discontinued. -Perform foot exam today.  Advised and encouraged patient and her granddaughter to get an appointment with ophthalmology as soon as possible.  No orders of the defined types were placed in this encounter.   Follow-up: Return in about 3 months (around 02/26/2020).    Tierney Behl R  Doristine Bosworth, MD

## 2019-11-27 DIAGNOSIS — R0902 Hypoxemia: Secondary | ICD-10-CM | POA: Diagnosis not present

## 2019-11-27 DIAGNOSIS — G4733 Obstructive sleep apnea (adult) (pediatric): Secondary | ICD-10-CM | POA: Diagnosis not present

## 2019-12-28 DIAGNOSIS — G4733 Obstructive sleep apnea (adult) (pediatric): Secondary | ICD-10-CM | POA: Diagnosis not present

## 2019-12-28 DIAGNOSIS — R0902 Hypoxemia: Secondary | ICD-10-CM | POA: Diagnosis not present

## 2020-01-28 DIAGNOSIS — G4733 Obstructive sleep apnea (adult) (pediatric): Secondary | ICD-10-CM | POA: Diagnosis not present

## 2020-01-28 DIAGNOSIS — R0902 Hypoxemia: Secondary | ICD-10-CM | POA: Diagnosis not present

## 2020-02-11 ENCOUNTER — Encounter: Payer: Self-pay | Admitting: Internal Medicine

## 2020-02-11 ENCOUNTER — Ambulatory Visit (INDEPENDENT_AMBULATORY_CARE_PROVIDER_SITE_OTHER): Payer: Medicare Other | Admitting: Internal Medicine

## 2020-02-11 ENCOUNTER — Other Ambulatory Visit: Payer: Self-pay

## 2020-02-11 VITALS — BP 142/70 | HR 73 | Temp 97.7°F | Resp 18 | Ht 63.0 in | Wt 228.8 lb

## 2020-02-11 DIAGNOSIS — I2510 Atherosclerotic heart disease of native coronary artery without angina pectoris: Secondary | ICD-10-CM | POA: Diagnosis not present

## 2020-02-11 DIAGNOSIS — E1159 Type 2 diabetes mellitus with other circulatory complications: Secondary | ICD-10-CM

## 2020-02-11 DIAGNOSIS — E039 Hypothyroidism, unspecified: Secondary | ICD-10-CM

## 2020-02-11 DIAGNOSIS — R42 Dizziness and giddiness: Secondary | ICD-10-CM | POA: Diagnosis not present

## 2020-02-11 DIAGNOSIS — I10 Essential (primary) hypertension: Secondary | ICD-10-CM | POA: Diagnosis not present

## 2020-02-11 DIAGNOSIS — Z0100 Encounter for examination of eyes and vision without abnormal findings: Secondary | ICD-10-CM

## 2020-02-11 DIAGNOSIS — F419 Anxiety disorder, unspecified: Secondary | ICD-10-CM | POA: Insufficient documentation

## 2020-02-11 DIAGNOSIS — E1169 Type 2 diabetes mellitus with other specified complication: Secondary | ICD-10-CM

## 2020-02-11 DIAGNOSIS — Z780 Asymptomatic menopausal state: Secondary | ICD-10-CM

## 2020-02-11 DIAGNOSIS — K219 Gastro-esophageal reflux disease without esophagitis: Secondary | ICD-10-CM

## 2020-02-11 DIAGNOSIS — F411 Generalized anxiety disorder: Secondary | ICD-10-CM | POA: Insufficient documentation

## 2020-02-11 NOTE — Patient Instructions (Signed)
Please continue to take medications as prescribed.  Please take Meclizine 12.5 mg up to twice daily for dizziness.  Please use cane or walker for walking support to avoid falls.  You are advised to sit by the end of the bed for 2 minutes after waking up before you start to walk.  You are being referred to Neurology for evaluation of dizziness and because of history of TIA.  Please get blood tests done today or tomorrow. We will adjust the dose of thyroid medicine if needed based on it.  Please continue to follow up with your Cardiologist as scheduled.

## 2020-02-11 NOTE — Assessment & Plan Note (Addendum)
BP Readings from Last 2 Encounters:  02/11/20 (!) 142/70  11/26/19 (!) 160/62   On Metoprolol 12.5 mg BID and Imdur 60 mg QD Has h/o low BP in the past, would avoid hypotensive episode --> keep the current dose of Metoprolol Advised low-salt diet and medication compliance Follows up with Cardiology

## 2020-02-11 NOTE — Assessment & Plan Note (Signed)
Takes Xanax, sometimes twice daily Advised to decrease Xanax to once daily Plan to discontinue Xanax in the next visit and trial of Trazodone Patient also takes Ambien for insomnia.

## 2020-02-11 NOTE — Progress Notes (Signed)
Established Patient Office Visit  Subjective:  Patient ID: [name], female    DOB: June 21, 1945  Age: 74 y.o. MRN: [id number]  CC:  Chief Complaint  Patient presents with  . Follow-up    pahwani pt 3 month follow up pt is still having dizzy spells and feels weak     HPI [name] is a 74 year old female with past medical history of hypertension, hyperlipidemia, mesenteric ischemia status post right hemicolectomy, coronary artery disease, hypothyroidism, type 2 diabetes mellitus, depression with anxiety, obesity, arthritis, chronic back pain presents for follow up of her chronic conditions. Granddaughter is with her. Patient has baseline hearing difficulty.  Patient states that she still has dizziness and sways to the left unintentionally while walking. She denies any dizziness while sitting, denies any room spinning sensation. Describes it as more like lightheadedness, which has about the same for many years. She denies tinnitus. No reported falls in last 1 year.  Her BP was 169/72 initially, upon manual check it was 142/80. She takes Metoprolol regularly. She denies headache, chest pain, dyspnea or palpitations. She follows up with Dr Gwen Her for management of CAD and hypertension.  Her GERD symptoms are better now. Denies weight or appetite change.LAst TSH noted to be 8.55, for which she takes Levothyroxine.  Past Medical History:  Diagnosis Date  . Acute ischemic colitis ([place]) 09/11/2005  . Anxiety   . Arthritis   . Atherosclerotic vascular disease    Calcified plaque at the origin of the celiac and SMA  . CHF (congestive heart failure) ([place])   . Coronary atherosclerosis of native coronary artery    Mulitvessel LVEF 50-50%, DES circ 1/11, occluded SVG to OM and SVG to diagonal , LVEF 60%  . Depression   . Diverticulosis of colon   . Essential hypertension   . Hypothyroidism   . Mesenteric ischemia ([place])   . Mitral regurgitation    Mild to moderate  . Mixed  hyperlipidemia   . Myocardial infarction ([place]) 1990  . Obesity   . Orthostatic hypotension   . PAD (peripheral artery disease) ([place])   . PVC's (premature ventricular contractions)   . Schizophrenia ([place])   . Sleep apnea    CPAP  . Tubular adenoma   . Type 2 diabetes mellitus ([place])     Past Surgical History:  Procedure Laterality Date  . BACK SURGERY     Lumbar spine surgery  . Carotid endarectomy Bilateral   . CARPAL TUNNEL RELEASE     Bilateral  . CATARACT EXTRACTION W/PHACO Right 08/24/2013   Procedure: CATARACT EXTRACTION PHACO AND INTRAOCULAR LENS PLACEMENT (IOC);  Surgeon: Tonny Branch, MD;  Location: [facility];  Service: Ophthalmology;  Laterality: Right;  CDE 8.68  . COLONOSCOPY  09/14/2005   Dr. Leonard Schwartz colitis  . COLONOSCOPY WITH ESOPHAGOGASTRODUODENOSCOPY (EGD)  04/14/2012   Dr. Gala Romney- EGD= gastric erosions of doubtful clinical significance per bx- chronic inactive gastritis, benign small bowel mucosa. TCS=colonic diverticulosis, tubular adenoma  . CORONARY ARTERY BYPASS GRAFT     LIMA-LAD; SVG-OM; SVG-DX in 1995 [place]  . NECK SURGERY     Cervical laminectomy  . PARTIAL HYSTERECTOMY    . stents x4      Family History  Problem Relation Age of Onset  . Alcohol abuse Mother   . Alcohol abuse Father   . Coronary artery disease Other   . Hypertension Other   . Crohn's disease Sister 48  . Stroke Daughter     Social History  Socioeconomic History  . Marital status: Widowed    Spouse name: Not on file  . Number of children: 4  . Years of education: Not on file  . Highest education level: Not on file  Occupational History  . Occupation: DISABLED    Employer: UNEMPLOYED  Tobacco Use  . Smoking status: Former Smoker    Packs/day: 0.50    Years: 50.00    Pack years: 25.00    Types: Cigarettes    Start date: 12/30/2018  . Smokeless tobacco: Never Used  Vaping Use  . Vaping Use: Never used  Substance and Sexual Activity  . Alcohol use: No     Alcohol/week: 0.0 standard drinks    Comment: Rare etoh 3-4 drinks per yr, hx beer heavily for 10-12 yrs, quit heavy etoh 1990  . Drug use: No  . Sexual activity: Not on file  Other Topics Concern  . Not on file  Social History Narrative   LIves alone   Social Determinants of Health   Financial Resource Strain:   . Difficulty of Paying Living Expenses: Not on file  Food Insecurity:   . Worried About Charity fundraiser in the Last Year: Not on file  . Ran Out of Food in the Last Year: Not on file  Transportation Needs:   . Lack of Transportation (Medical): Not on file  . Lack of Transportation (Non-Medical): Not on file  Physical Activity:   . Days of Exercise per Week: Not on file  . Minutes of Exercise per Session: Not on file  Stress:   . Feeling of Stress : Not on file  Social Connections:   . Frequency of Communication with Friends and Family: Not on file  . Frequency of Social Gatherings with Friends and Family: Not on file  . Attends Religious Services: Not on file  . Active Member of Clubs or Organizations: Not on file  . Attends Archivist Meetings: Not on file  . Marital Status: Not on file  Intimate Partner Violence:   . Fear of Current or Ex-Partner: Not on file  . Emotionally Abused: Not on file  . Physically Abused: Not on file  . Sexually Abused: Not on file    Outpatient Medications Prior to Visit  Medication Sig Dispense Refill  . ALPRAZolam (XANAX) 0.5 MG tablet Take 0.5 mg by mouth in the morning, at noon, in the evening, and at bedtime.    Marland Kitchen aspirin EC 81 MG tablet Take 81 mg by mouth daily.    Marland Kitchen buPROPion (WELLBUTRIN XL) 150 MG 24 hr tablet Take 150 mg by mouth in the morning and at bedtime.    . clopidogrel (PLAVIX) 75 MG tablet Take 75 mg by mouth daily.      Marland Kitchen gabapentin (NEURONTIN) 300 MG capsule Take 300 mg by mouth 2 (two) times daily.    . isosorbide mononitrate (IMDUR) 60 MG 24 hr tablet Take 60 mg by mouth daily.     Marland Kitchen  levothyroxine (SYNTHROID) 25 MCG tablet Take 1 tablet by mouth daily.    . metoprolol tartrate (LOPRESSOR) 25 MG tablet Take 0.5 tablets (12.5 mg total) by mouth 2 (two) times daily. 90 tablet 3  . nitroGLYCERIN (NITROSTAT) 0.4 MG SL tablet Place 1 tablet (0.4 mg total) under the tongue every 5 (five) minutes as needed. 90 tablet 3  . ondansetron (ZOFRAN-ODT) 4 MG disintegrating tablet Take 1 tablet (4 mg total) by mouth every 8 (eight) hours as needed for nausea or  vomiting. 20 tablet 0  . pantoprazole (PROTONIX) 40 MG tablet Take 1 tablet (40 mg total) by mouth daily. 90 tablet 1  . polyethylene glycol (MIRALAX / GLYCOLAX) 17 g packet Take 17 g by mouth daily.    . pravastatin (PRAVACHOL) 80 MG tablet Take 1 tablet (80 mg total) by mouth daily. 90 tablet 1  . simethicone (MYLICON) 998 MG chewable tablet Chew 1 tablet (125 mg total) by mouth every 6 (six) hours as needed for flatulence. 30 tablet 0  . SPIRIVA HANDIHALER 18 MCG inhalation capsule 1 capsule daily.    . Tiotropium Bromide-Olodaterol (STIOLTO RESPIMAT) 2.5-2.5 MCG/ACT AERS Inhale 2 puffs into the lungs daily. 1 Inhaler 0  . torsemide (DEMADEX) 20 MG tablet Take 2 tablets (40 mg total) by mouth as needed. 30 tablet 0  . zolpidem (AMBIEN) 10 MG tablet Take 10 mg by mouth at bedtime as needed for sleep.    Marland Kitchen diclofenac (VOLTAREN) 75 MG EC tablet Take 1 tablet (75 mg total) by mouth 2 (two) times daily. 30 tablet 1   No facility-administered medications prior to visit.    No Known Allergies  ROS Review of Systems  Constitutional: Negative for chills and fever.  HENT: Negative for congestion, sinus pressure, sinus pain and sore throat.   Eyes: Negative for pain and discharge.  Respiratory: Negative for cough and shortness of breath.   Cardiovascular: Negative for chest pain and palpitations.  Gastrointestinal: Positive for constipation (Chronic). Negative for abdominal pain, diarrhea, nausea and vomiting.  Endocrine: Negative for  polydipsia and polyuria.  Genitourinary: Negative for dysuria and hematuria.  Musculoskeletal: Negative for neck pain and neck stiffness.  Skin: Negative for rash.  Neurological: Positive for dizziness and light-headedness. Negative for seizures, syncope, speech difficulty, weakness, numbness and headaches.  Psychiatric/Behavioral: Negative for agitation and behavioral problems.      Objective:    Physical Exam Vitals reviewed.  Constitutional:      General: She is not in acute distress.    Appearance: She is obese. She is not diaphoretic.  HENT:     Head: Normocephalic and atraumatic.     Nose: Nose normal. No congestion.     Mouth/Throat:     Mouth: Mucous membranes are moist.     Pharynx: No posterior oropharyngeal erythema.  Eyes:     General: No scleral icterus.    Extraocular Movements: Extraocular movements intact.     Pupils: Pupils are equal, round, and reactive to light.  Cardiovascular:     Rate and Rhythm: Normal rate and regular rhythm.     Heart sounds: Normal heart sounds. No murmur heard.  No friction rub. No gallop.   Pulmonary:     Breath sounds: Normal breath sounds. No wheezing or rales.  Abdominal:     Palpations: Abdomen is soft.     Tenderness: There is no abdominal tenderness.  Musculoskeletal:     Cervical back: Normal range of motion and neck supple. No rigidity or tenderness.     Right lower leg: No edema.     Left lower leg: No edema.  Skin:    General: Skin is warm.     Findings: No rash.  Neurological:     General: No focal deficit present.     Mental Status: She is alert and oriented to person, place, and time.     Sensory: No sensory deficit.     Motor: No weakness.     Gait: Gait abnormal (Likely in the setting of  baseline hip pain).  Psychiatric:        Mood and Affect: Mood normal.        Behavior: Behavior normal.     BP (!) 142/70 (BP Location: Right Arm, Patient Position: Sitting)   Pulse 73   Temp 97.7 F (36.5 C)  (Temporal)   Resp 18   Ht 5\' 3"  (1.6 m)   Wt 228 lb 12.8 oz (103.8 kg)   SpO2 95%   BMI 40.53 kg/m  Wt Readings from Last 3 Encounters:  02/11/20 228 lb 12.8 oz (103.8 kg)  11/26/19 (!) 226 lb 1.9 oz (102.6 kg)  10/21/19 223 lb 12.8 oz (101.5 kg)     Health Maintenance Due  Topic Date Due  . Hepatitis C Screening  Never done  . FOOT EXAM  Never done  . OPHTHALMOLOGY EXAM  Never done  . TETANUS/TDAP  Never done  . DEXA SCAN  Never done  . PNA vac Low Risk Adult (1 of 2 - PCV13) Never done    There are no preventive care reminders to display for this patient.  Lab Results  Component Value Date   TSH 8.55 (H) 10/15/2019   Lab Results  Component Value Date   WBC 7.4 10/15/2019   HGB 9.0 (L) 10/15/2019   HCT 29.0 (L) 10/15/2019   MCV 97.3 10/15/2019   PLT 255 10/15/2019   Lab Results  Component Value Date   NA 138 10/15/2019   K 4.5 10/15/2019   CO2 28 10/15/2019   GLUCOSE 104 10/15/2019   BUN 15 10/15/2019   CREATININE 1.04 (H) 10/15/2019   BILITOT 0.3 10/15/2019   ALKPHOS 59 09/30/2019   AST 21 10/15/2019   ALT 12 10/15/2019   PROT 5.8 (L) 10/15/2019   ALBUMIN 2.2 (L) 09/30/2019   CALCIUM 8.2 (L) 10/15/2019   ANIONGAP 5 10/02/2019   Lab Results  Component Value Date   CHOL 142 08/19/2017   Lab Results  Component Value Date   HDL 45 08/19/2017   Lab Results  Component Value Date   LDLCALC 66 08/19/2017   Lab Results  Component Value Date   TRIG 154 (H) 08/19/2017   Lab Results  Component Value Date   CHOLHDL 3.2 08/19/2017   Lab Results  Component Value Date   HGBA1C 6.6 (H) 09/29/2019      Assessment & Plan:   Problem List Items Addressed This Visit      Cardiovascular and Mediastinum   Benign essential hypertension    BP Readings from Last 2 Encounters:  02/11/20 (!) 142/70  11/26/19 (!) 160/62   On Metoprolol 12.5 mg BID and Imdur 60 mg QD Has h/o low BP in the past, would avoid hypotensive episode --> keep the current dose of  Metoprolol Advised low-salt diet and medication compliance Follows up with Cardiology       CAD, NATIVE VESSEL    S/p CABG On DAPT, Metoprolol and Indur No active angina symptoms Follows up with Cardiology - Dr Domenic Polite        Digestive   GERD (gastroesophageal reflux disease)    Better now, on Pantoprazole        Endocrine   DM2 (diabetes mellitus, type 2) ([place])    Last HbA1C: 6.6 (09/2019) Diet controlled Avoid tighter control in her case due to risk of hypoglycemia related fall      Hypothyroidism    Lab Results  Component Value Date   TSH 8.55 (H) 10/15/2019   Was started  on Levothyroxine 25 mcg recently Check TSH and free T4, adjust dose accordingly      Relevant Orders   TSH + free T4     Other   Dizziness    Chronic dizziness Advised to avoid sudden positional changes to avoid orthostatic hypotension related fall Meclizine PRN Referral to Neurology as she also has h/o TIAs in the past      Relevant Orders   Ambulatory referral to Neurology   Anxiety    Takes Xanax, sometimes twice daily Advised to decrease Xanax to once daily Plan to discontinue Xanax in the next visit and trial of Trazodone Patient also takes Ambien for insomnia.       Other Visit Diagnoses    Routine eye exam    -  Primary   Relevant Orders   Ambulatory referral to Ophthalmology   Post-menopausal       Relevant Orders   DG Bone Density      No orders of the defined types were placed in this encounter.   Follow-up: Return in about 3 months (around 05/13/2020).    Lindell Spar, MD

## 2020-02-11 NOTE — Assessment & Plan Note (Signed)
Chronic dizziness Advised to avoid sudden positional changes to avoid orthostatic hypotension related fall Meclizine PRN Referral to Neurology as she also has h/o TIAs in the past

## 2020-02-11 NOTE — Assessment & Plan Note (Signed)
Last HbA1C: 6.6 (09/2019) Diet controlled Avoid tighter control in her case due to risk of hypoglycemia related fall 

## 2020-02-11 NOTE — Assessment & Plan Note (Signed)
S/p CABG On DAPT, Metoprolol and Indur No active angina symptoms Follows up with Cardiology - Dr Domenic Polite

## 2020-02-11 NOTE — Assessment & Plan Note (Signed)
Lab Results  Component Value Date   TSH 8.55 (H) 10/15/2019   Was started on Levothyroxine 25 mcg recently Check TSH and free T4, adjust dose accordingly

## 2020-02-11 NOTE — Assessment & Plan Note (Signed)
Better now, on Pantoprazole

## 2020-02-12 ENCOUNTER — Telehealth: Payer: Self-pay

## 2020-02-12 DIAGNOSIS — E039 Hypothyroidism, unspecified: Secondary | ICD-10-CM | POA: Diagnosis not present

## 2020-02-12 NOTE — Telephone Encounter (Signed)
Pt needs Wellbutrin & Gabapentin called in

## 2020-02-15 ENCOUNTER — Other Ambulatory Visit: Payer: Self-pay | Admitting: *Deleted

## 2020-02-15 MED ORDER — BUPROPION HCL ER (XL) 150 MG PO TB24: 150.0000 mg | ORAL_TABLET | Freq: Two times a day (BID) | ORAL | 0 refills | Status: DC

## 2020-02-15 MED ORDER — GABAPENTIN 300 MG PO CAPS: 300.0000 mg | ORAL_CAPSULE | Freq: Two times a day (BID) | ORAL | 0 refills | Status: DC

## 2020-02-15 NOTE — Telephone Encounter (Signed)
Medication sent to pharmacy  

## 2020-02-27 DIAGNOSIS — G4733 Obstructive sleep apnea (adult) (pediatric): Secondary | ICD-10-CM | POA: Diagnosis not present

## 2020-02-27 DIAGNOSIS — R0902 Hypoxemia: Secondary | ICD-10-CM | POA: Diagnosis not present

## 2020-03-08 ENCOUNTER — Telehealth: Payer: Self-pay | Admitting: Internal Medicine

## 2020-03-08 DIAGNOSIS — E039 Hypothyroidism, unspecified: Secondary | ICD-10-CM

## 2020-03-08 MED ORDER — LEVOTHYROXINE SODIUM 50 MCG PO TABS: 50.0000 ug | ORAL_TABLET | Freq: Every day | ORAL | 0 refills | Status: DC

## 2020-03-08 NOTE — Telephone Encounter (Signed)
Patient needing to get results of most recent blood work please call 11:30 or after p# [phone]

## 2020-03-08 NOTE — Addendum Note (Signed)
Addended byIhor Dow on: 03/08/2020 04:58 PM   Modules accepted: Orders

## 2020-03-08 NOTE — Telephone Encounter (Signed)
Spoke with pt daughter she said she had last labs drawn at [facility] and she would like to know the results let her know we do not have these results but I would request them and she should hear something back by Friday

## 2020-03-10 NOTE — Telephone Encounter (Signed)
Pt daughter notified with verbal understanding

## 2020-03-11 ENCOUNTER — Telehealth (INDEPENDENT_AMBULATORY_CARE_PROVIDER_SITE_OTHER): Payer: Medicare Other | Admitting: Internal Medicine

## 2020-03-11 ENCOUNTER — Other Ambulatory Visit: Payer: Self-pay

## 2020-03-11 ENCOUNTER — Encounter: Payer: Self-pay | Admitting: Internal Medicine

## 2020-03-11 DIAGNOSIS — L309 Dermatitis, unspecified: Secondary | ICD-10-CM | POA: Diagnosis not present

## 2020-03-11 MED ORDER — KETOCONAZOLE 2 % EX SHAM: 1.0000 "application " | MEDICATED_SHAMPOO | CUTANEOUS | 0 refills | Status: DC

## 2020-03-11 MED ORDER — TRIAMCINOLONE ACETONIDE 0.1 % EX CREA: 1.0000 "application " | TOPICAL_CREAM | Freq: Two times a day (BID) | CUTANEOUS | 0 refills | Status: DC

## 2020-03-11 NOTE — Progress Notes (Signed)
Virtual Visit via Telephone Note   This visit type was conducted due to national recommendations for restrictions regarding the COVID-19 Pandemic (e.g. social distancing) in an effort to limit this patient's exposure and mitigate transmission in our community.  Due to her co-morbid illnesses, this patient is at least at moderate risk for complications without adequate follow up.  This format is felt to be most appropriate for this patient at this time.  The patient did not have access to video technology/had technical difficulties with video requiring transitioning to audio format only (telephone).  All issues noted in this document were discussed and addressed.  No physical exam could be performed with this format.  Evaluation Performed:  Follow-up visit  Date:  03/11/2020   ID:  [name], DOB 02/23/1946, MRN [id number]  Patient Location: Home Provider Location: Office/Clinic  Location of Patient: Home Location of Provider: Telehealth Consent was obtain for visit to be over via telehealth. I verified that I am speaking with the correct person using two identifiers.  PCP:  Lindell Spar, MD   Chief Complaint: Itching and scabs over head and ears  History of Present Illness:    [name] is a 74 y.o. female with past medical history of hypertension, hyperlipidemia, mesenteric ischemia status post right hemicolectomy, coronary artery disease, hypothyroidism, type 2 diabetes mellitus, depression with anxiety, obesity, arthritis, chronic back pain who has a televisit for c/o itching and scabs over scalp, ears and sometimes over hands for last few weeks. She denies any bumps or drainage. She has noticed hair loss over some areas of the scalp. She wants a referral to dermatologist.  She denies any color change in the scalp or over the ears.  She denies any family h/o skin cancer.  The patient does not have symptoms concerning for COVID-19 infection (fever, chills, cough,  or new shortness of breath).   Past Medical, Surgical, Social History, Allergies, and Medications have been Reviewed.  Past Medical History:  Diagnosis Date  . Acute ischemic colitis ([place]) 09/11/2005  . Anxiety   . Arthritis   . Atherosclerotic vascular disease    Calcified plaque at the origin of the celiac and SMA  . CHF (congestive heart failure) ([place])   . Coronary atherosclerosis of native coronary artery    Mulitvessel LVEF 50-50%, DES circ 1/11, occluded SVG to OM and SVG to diagonal , LVEF 60%  . Depression   . Diverticulosis of colon   . Essential hypertension   . Hypothyroidism   . Mesenteric ischemia ([place])   . Mitral regurgitation    Mild to moderate  . Mixed hyperlipidemia   . Myocardial infarction ([place]) 1990  . Obesity   . Orthostatic hypotension   . PAD (peripheral artery disease) ([place])   . PVC's (premature ventricular contractions)   . Schizophrenia ([place])   . Sleep apnea    CPAP  . Tubular adenoma   . Type 2 diabetes mellitus ([place])    Past Surgical History:  Procedure Laterality Date  . BACK SURGERY     Lumbar spine surgery  . Carotid endarectomy Bilateral   . CARPAL TUNNEL RELEASE     Bilateral  . CATARACT EXTRACTION W/PHACO Right 08/24/2013   Procedure: CATARACT EXTRACTION PHACO AND INTRAOCULAR LENS PLACEMENT (IOC);  Surgeon: Tonny Branch, MD;  Location: [facility];  Service: Ophthalmology;  Laterality: Right;  CDE 8.68  . COLONOSCOPY  09/14/2005   Dr. Leonard Schwartz colitis  . COLONOSCOPY WITH ESOPHAGOGASTRODUODENOSCOPY (EGD)  04/14/2012   Dr. Gala Romney- EGD= gastric erosions of doubtful clinical significance per bx- chronic inactive gastritis, benign small bowel mucosa. TCS=colonic diverticulosis, tubular adenoma  . CORONARY ARTERY BYPASS GRAFT     LIMA-LAD; SVG-OM; SVG-DX in 1995 [place]  . NECK SURGERY     Cervical laminectomy  . PARTIAL HYSTERECTOMY    . stents x4       Current Meds  Medication Sig  . ALPRAZolam (XANAX) 0.5 MG tablet Take 0.5 mg by mouth  in the morning, at noon, in the evening, and at bedtime.  Marland Kitchen aspirin EC 81 MG tablet Take 81 mg by mouth daily.  Marland Kitchen buPROPion (WELLBUTRIN XL) 150 MG 24 hr tablet Take 1 tablet (150 mg total) by mouth in the morning and at bedtime.  . clopidogrel (PLAVIX) 75 MG tablet Take 75 mg by mouth daily.    Marland Kitchen gabapentin (NEURONTIN) 300 MG capsule Take 1 capsule (300 mg total) by mouth 2 (two) times daily.  . isosorbide mononitrate (IMDUR) 60 MG 24 hr tablet Take 60 mg by mouth daily.   Marland Kitchen levothyroxine (SYNTHROID) 50 MCG tablet Take 1 tablet (50 mcg total) by mouth daily before breakfast.  . metoprolol tartrate (LOPRESSOR) 25 MG tablet Take 0.5 tablets (12.5 mg total) by mouth 2 (two) times daily.  . nitroGLYCERIN (NITROSTAT) 0.4 MG SL tablet Place 1 tablet (0.4 mg total) under the tongue every 5 (five) minutes as needed.  . ondansetron (ZOFRAN-ODT) 4 MG disintegrating tablet Take 1 tablet (4 mg total) by mouth every 8 (eight) hours as needed for nausea or vomiting.  . pantoprazole (PROTONIX) 40 MG tablet Take 1 tablet (40 mg total) by mouth daily.  . polyethylene glycol (MIRALAX / GLYCOLAX) 17 g packet Take 17 g by mouth daily.  . pravastatin (PRAVACHOL) 80 MG tablet Take 1 tablet (80 mg total) by mouth daily.  . simethicone (MYLICON) 409 MG chewable tablet Chew 1 tablet (125 mg total) by mouth every 6 (six) hours as needed for flatulence.  Marland Kitchen SPIRIVA HANDIHALER 18 MCG inhalation capsule 1 capsule daily.  . Tiotropium Bromide-Olodaterol (STIOLTO RESPIMAT) 2.5-2.5 MCG/ACT AERS Inhale 2 puffs into the lungs daily.  Marland Kitchen torsemide (DEMADEX) 20 MG tablet Take 2 tablets (40 mg total) by mouth as needed.  . zolpidem (AMBIEN) 10 MG tablet Take 10 mg by mouth at bedtime as needed for sleep.     Allergies:   Patient has no known allergies.   ROS:   ROS Review of Systems  Constitutional: Negative for chills and fever.  HENT: Negative for congestion, sinus pressure, sinus pain and sore throat.   Eyes: Negative for  pain and discharge.  Respiratory: Negative for cough and shortness of breath.   Cardiovascular: Negative for chest pain and palpitations.  Gastrointestinal: Positive for constipation (Chronic). Negative for abdominal pain, diarrhea, nausea and vomiting.  Endocrine: Negative for polydipsia and polyuria.  Genitourinary: Negative for dysuria and hematuria.  Musculoskeletal: Negative for neck pain and neck stiffness.  Skin: Positive for itching and scabs over the scalp and ears.  No discharge or bleeding. Neurological: Negative for seizures, syncope, speech difficulty, weakness, numbness and headaches.  Psychiatric/Behavioral: Negative for agitation and behavioral problems.    Labs/Other Tests and Data Reviewed:    Recent Labs: 10/02/2019: Magnesium 1.6 10/15/2019: ALT 12; BUN 15; Creat 1.04; Hemoglobin 9.0; Platelets 255; Potassium 4.5; Sodium 138; TSH 8.55   Recent Lipid Panel Lab Results  Component Value Date/Time   CHOL 142 08/19/2017 03:05 PM   TRIG 154 (  H) 08/19/2017 03:05 PM   HDL 45 08/19/2017 03:05 PM   CHOLHDL 3.2 08/19/2017 03:05 PM   LDLCALC 66 08/19/2017 03:05 PM    Wt Readings from Last 3 Encounters:  02/11/20 228 lb 12.8 oz (103.8 kg)  11/26/19 (!) 226 lb 1.9 oz (102.6 kg)  10/21/19 223 lb 12.8 oz (101.5 kg)      ASSESSMENT & PLAN:    Dermatitis, likely seborrheic Prescribed Kenoconazole shampoo Triamcinolone cream for itching Avoid sharing towel, napkins Apply lotion/moisturizer to avoid dry skin Apply sunscreen lotion before outdoor activities Dermatology referral provided as per patient request  Time:   Today, I have spent 15 minutes with the patient with telehealth technology discussing the above problems.     Medication Adjustments/Labs and Tests Ordered: Current medicines are reviewed at length with the patient today.  Concerns regarding medicines are outlined above.   Tests Ordered: No orders of the defined types were placed in this  encounter.   Medication Changes: Meds ordered this encounter  Medications  . ketoconazole (NIZORAL) 2 % shampoo    Sig: Apply 1 application topically 2 (two) times a week.    Dispense:  120 mL    Refill:  0  . triamcinolone cream (KENALOG) 0.1 %    Sig: Apply 1 application topically 2 (two) times daily.    Dispense:  30 g    Refill:  0     Note: This dictation was prepared with Dragon dictation along with smaller phrase technology. Similar sounding words can be transcribed inadequately or may not be corrected upon review. Any transcriptional errors that result from this process are unintentional.      Disposition:  Follow up  Signed, Lindell Spar, MD  03/11/2020 10:15 AM     [facility]

## 2020-03-11 NOTE — Patient Instructions (Addendum)
Seborrheic Dermatitis, Adult Seborrheic dermatitis is a skin disease that causes red, scaly patches. It usually occurs on the scalp, and it is often called dandruff. The patches may appear on other parts of the body. Skin patches tend to appear where there are many oil glands in the skin. Areas of the body that are commonly affected include:  Scalp.  Skin folds of the body.  Ears.  Eyebrows.  Neck.  Face.  Armpits.  The bearded area of men's faces. The condition may come and go for no known reason, and it is often long-lasting (chronic). What are the causes? The cause of this condition is not known. What increases the risk? This condition is more likely to develop in people who:  Have certain conditions, such as: ? HIV (human immunodeficiency virus). ? AIDS (acquired immunodeficiency syndrome). ? Parkinson disease. ? Mood disorders, such as depression.  Are 40-60 years old. What are the signs or symptoms? Symptoms of this condition include:  Thick scales on the scalp.  Redness on the face or in the armpits.  Skin that is flaky. The flakes may be white or yellow.  Skin that seems oily or dry but is not helped with moisturizers.  Itching or burning in the affected areas. How is this diagnosed? This condition is diagnosed with a medical history and physical exam. A sample of your skin may be tested (skin biopsy). You may need to see a skin specialist (dermatologist). How is this treated? There is no cure for this condition, but treatment can help to manage the symptoms. You may get treatment to remove scales, lower the risk of skin infection, and reduce swelling or itching. Treatment may include:  Creams that reduce swelling and irritation (steroids).  Creams that reduce skin yeast.  Medicated shampoo, soaps, moisturizing creams, or ointments.  Medicated moisturizing creams or ointments. Follow these instructions at home:  Apply over-the-counter and prescription  medicines only as told by your health care provider.  Use any medicated shampoo, soaps, skin creams, or ointments only as told by your health care provider.  Keep all follow-up visits as told by your health care provider. This is important. Contact a health care provider if:  Your symptoms do not improve with treatment.  Your symptoms get worse.  You have new symptoms. This information is not intended to replace advice given to you by your health care provider. Make sure you discuss any questions you have with your health care provider. Document Revised: 03/29/2017 Document Reviewed: 08/04/2015 Elsevier Patient Education  2020 Elsevier Inc.  

## 2020-03-21 ENCOUNTER — Telehealth: Payer: Self-pay | Admitting: Internal Medicine

## 2020-03-21 ENCOUNTER — Other Ambulatory Visit: Payer: Self-pay

## 2020-03-21 DIAGNOSIS — T81710D Complication of mesenteric artery following a procedure, not elsewhere classified, subsequent encounter: Secondary | ICD-10-CM

## 2020-03-21 MED ORDER — GABAPENTIN 300 MG PO CAPS: 300.0000 mg | ORAL_CAPSULE | Freq: Two times a day (BID) | ORAL | 0 refills | Status: DC

## 2020-03-21 NOTE — Telephone Encounter (Signed)
Patient is needing a refill on gabapentin sent to mail order pharmacy and xanax sent to [facility] in [place]

## 2020-03-28 ENCOUNTER — Telehealth: Payer: Self-pay | Admitting: Internal Medicine

## 2020-03-28 NOTE — Telephone Encounter (Signed)
Patient is needing a refill on gabapentin sent to mail order pharmacy and xanax sent to [facility] in [place]

## 2020-03-28 NOTE — Telephone Encounter (Signed)
Looks like gabapentin was already sent to mail order. Requesting xanax be sent to the drug store in [place]

## 2020-03-29 ENCOUNTER — Other Ambulatory Visit: Payer: Self-pay | Admitting: Internal Medicine

## 2020-03-29 DIAGNOSIS — F419 Anxiety disorder, unspecified: Secondary | ICD-10-CM

## 2020-03-29 DIAGNOSIS — G4733 Obstructive sleep apnea (adult) (pediatric): Secondary | ICD-10-CM | POA: Diagnosis not present

## 2020-03-29 DIAGNOSIS — R0902 Hypoxemia: Secondary | ICD-10-CM | POA: Diagnosis not present

## 2020-03-29 MED ORDER — ALPRAZOLAM 0.5 MG PO TABS: 0.5000 mg | ORAL_TABLET | Freq: Two times a day (BID) | ORAL | 0 refills | Status: DC | PRN

## 2020-03-29 NOTE — Telephone Encounter (Signed)
Sent. Thanks.   

## 2020-04-14 ENCOUNTER — Other Ambulatory Visit: Payer: Self-pay | Admitting: Internal Medicine

## 2020-04-14 ENCOUNTER — Other Ambulatory Visit: Payer: Self-pay

## 2020-04-14 ENCOUNTER — Telehealth: Payer: Self-pay | Admitting: Cardiology

## 2020-04-14 DIAGNOSIS — T81710D Complication of mesenteric artery following a procedure, not elsewhere classified, subsequent encounter: Secondary | ICD-10-CM

## 2020-04-14 MED ORDER — GABAPENTIN 300 MG PO CAPS: 300.0000 mg | ORAL_CAPSULE | Freq: Two times a day (BID) | ORAL | 0 refills | Status: DC

## 2020-04-14 MED ORDER — BUPROPION HCL ER (XL) 150 MG PO TB24: ORAL_TABLET | ORAL | 0 refills | Status: DC

## 2020-04-14 NOTE — Telephone Encounter (Signed)
   1. Which medications need to be refilled? (please list name of each medication and dose if known)  METOPROLOL 50 MG  2. Which pharmacy/location (including street and city if local pharmacy) is medication to be sent to?  [facility]   3. Do they need a 30 day or 90 day supply? [place]

## 2020-04-27 ENCOUNTER — Institutional Professional Consult (permissible substitution): Payer: Medicare Other | Admitting: Neurology

## 2020-04-28 DIAGNOSIS — G4733 Obstructive sleep apnea (adult) (pediatric): Secondary | ICD-10-CM | POA: Diagnosis not present

## 2020-04-28 DIAGNOSIS — R0902 Hypoxemia: Secondary | ICD-10-CM | POA: Diagnosis not present

## 2020-05-07 ENCOUNTER — Other Ambulatory Visit: Payer: Self-pay

## 2020-05-07 ENCOUNTER — Emergency Department (HOSPITAL_COMMUNITY)
Admission: EM | Admit: 2020-05-07 | Discharge: 2020-05-07 | Disposition: A | Discharge status: Home / Self Care | Payer: Medicare Other | Attending: Emergency Medicine | Admitting: Emergency Medicine

## 2020-05-07 ENCOUNTER — Encounter (HOSPITAL_COMMUNITY): Payer: Self-pay | Admitting: Emergency Medicine

## 2020-05-07 ENCOUNTER — Emergency Department (HOSPITAL_COMMUNITY): Payer: Medicare Other

## 2020-05-07 DIAGNOSIS — Z7982 Long term (current) use of aspirin: Secondary | ICD-10-CM | POA: Insufficient documentation

## 2020-05-07 DIAGNOSIS — Z7902 Long term (current) use of antithrombotics/antiplatelets: Secondary | ICD-10-CM | POA: Diagnosis not present

## 2020-05-07 DIAGNOSIS — I25810 Atherosclerosis of coronary artery bypass graft(s) without angina pectoris: Secondary | ICD-10-CM | POA: Diagnosis not present

## 2020-05-07 DIAGNOSIS — M25561 Pain in right knee: Secondary | ICD-10-CM | POA: Diagnosis not present

## 2020-05-07 DIAGNOSIS — I2510 Atherosclerotic heart disease of native coronary artery without angina pectoris: Secondary | ICD-10-CM | POA: Insufficient documentation

## 2020-05-07 DIAGNOSIS — J449 Chronic obstructive pulmonary disease, unspecified: Secondary | ICD-10-CM | POA: Diagnosis not present

## 2020-05-07 DIAGNOSIS — I509 Heart failure, unspecified: Secondary | ICD-10-CM | POA: Diagnosis not present

## 2020-05-07 DIAGNOSIS — Z87891 Personal history of nicotine dependence: Secondary | ICD-10-CM | POA: Insufficient documentation

## 2020-05-07 DIAGNOSIS — E119 Type 2 diabetes mellitus without complications: Secondary | ICD-10-CM | POA: Insufficient documentation

## 2020-05-07 DIAGNOSIS — I110 Hypertensive heart disease with heart failure: Secondary | ICD-10-CM | POA: Insufficient documentation

## 2020-05-07 DIAGNOSIS — E039 Hypothyroidism, unspecified: Secondary | ICD-10-CM | POA: Diagnosis not present

## 2020-05-07 DIAGNOSIS — Z79899 Other long term (current) drug therapy: Secondary | ICD-10-CM | POA: Insufficient documentation

## 2020-05-07 LAB — CBG MONITORING, ED: Glucose-Capillary: 88 mg/dL (ref 70–99)

## 2020-05-07 MED ORDER — TRAMADOL HCL 50 MG PO TABS: 50.0000 mg | ORAL_TABLET | Freq: Four times a day (QID) | ORAL | 0 refills | Status: AC | PRN

## 2020-05-07 MED ORDER — ACETAMINOPHEN 325 MG PO TABS
650.0000 mg | ORAL_TABLET | Freq: Once | ORAL | Status: AC
  Administered 2020-05-07: 650 mg via ORAL
  Filled 2020-05-07: qty 2

## 2020-05-07 MED ORDER — OXYCODONE-ACETAMINOPHEN 5-325 MG PO TABS
1.0000 | ORAL_TABLET | Freq: Once | ORAL | Status: AC
  Administered 2020-05-07: 1 via ORAL
  Filled 2020-05-07: qty 1

## 2020-05-07 NOTE — ED Provider Notes (Signed)
[facility] EMERGENCY DEPARTMENT Provider Note   CSN: [id number] Arrival date & time: 05/07/20  1210     History Chief Complaint  Patient presents with   Leg Pain    [name] is a 75 y.o. female with past medical history of hypertension, hyperlipidemia, mesenteric ischemia status post right hemicolectomy, stent, CAD, hypothyroidism, diabetes, chronic back pain presents to the ED for evaluation of right knee pain.  This began in December 25.  This was sudden.  Patient was trying to sit down on a low stool when she felt a sudden pain that she describes as a "charley horse" in the back of her knee.  The pain is constant at rest but much worse when she tries to put weight on it, walk or bend her knee.  It feels like her kneecap is rubbing against something.  Over the last few days the pain has worsened and is shooting up into her right buttock, low back as well as her calf and ankle and foot especially when she puts weight on the right leg.  Reports intermittent tingling in her toes.  Yesterday her right knee was swollen but this has since improved.  While sitting in the waiting room she felt like her soft and her shoe on the right side felt tighter than usual.  Patient is to be on oxycodone for chronic back pain for years but states 2 years ago her doctors told her she had to stop.  She still has a few pills of oxycodone at home and yesterday she took 1 and this improved her pain.  She is on Plavix and has been compliant.  No history of DVT or PE.  No previous history of knee injuries, surgeries.  Denies chest pain, shortness of breath    HPI     Past Medical History:  Diagnosis Date   Acute ischemic colitis ([place]) 09/11/2005   Anxiety    Arthritis    Atherosclerotic vascular disease    Calcified plaque at the origin of the celiac and SMA   CHF (congestive heart failure) (HCC)    Coronary atherosclerosis of native coronary artery    Mulitvessel LVEF 50-50%, DES circ 1/11,  occluded SVG to OM and SVG to diagonal , LVEF 60%   Depression    Diverticulosis of colon    Essential hypertension    Hypothyroidism    Mesenteric ischemia (HCC)    Mitral regurgitation    Mild to moderate   Mixed hyperlipidemia    Myocardial infarction ([place]) 1990   Obesity    Orthostatic hypotension    PAD (peripheral artery disease) (HCC)    PVC's (premature ventricular contractions)    Schizophrenia (HCC)    Sleep apnea    CPAP   Tubular adenoma    Type 2 diabetes mellitus ([place])     Patient Active Problem List   Diagnosis Date Noted   Dermatitis 03/11/2020   Dizziness 02/11/2020   Anxiety 02/11/2020   Ischemic colitis ([place]) [id number]   Vascular complications of mesenteric artery    Colitis 09/29/2019   Nausea & vomiting 09/29/2019   DM2 (diabetes mellitus, type 2) ([place]) 09/29/2019   GERD (gastroesophageal reflux disease) 09/29/2019   Hypothyroidism 09/29/2019   Surgical site infection 06/30/2019   Carotid stenosis, right 11/20/2017   TIA (transient ischemic attack) 11/20/2017   COPD (chronic obstructive pulmonary disease) ([place]) 09/27/2017   OSA (obstructive sleep apnea) 09/24/2017   MDD (major depressive disorder), recurrent episode, moderate ([place]) 11/05/2016  PAD (peripheral artery disease) ([place]) 12/06/2014   Chronic mesenteric ischemia ([place]) 08/12/2012   Chronic diarrhea 03/18/2012   Abdominal pain 03/18/2012   Unintentional weight loss 03/18/2012   Carotid bruit 01/03/2012   Depression 06/04/2011   SINUS BRADYCARDIA 10/13/2009   ORTHOSTATIC HYPOTENSION 10/04/2009   Dyspnea on exertion 05/06/2009   MITRAL REGURGITATION 03/18/2009   Tobacco abuse 02/11/2009   HLD (hyperlipidemia) 01/19/2009   Benign essential hypertension 01/19/2009   CAD, NATIVE VESSEL 01/19/2009    Past Surgical History:  Procedure Laterality Date   BACK SURGERY     Lumbar spine surgery   Carotid endarectomy Bilateral    CARPAL  TUNNEL RELEASE     Bilateral   CATARACT EXTRACTION W/PHACO Right 08/24/2013   Procedure: CATARACT EXTRACTION PHACO AND INTRAOCULAR LENS PLACEMENT ([place]);  Surgeon: Tonny Branch, MD;  Location: [facility];  Service: Ophthalmology;  Laterality: Right;  CDE 8.68   COLONOSCOPY  09/14/2005   Dr. Leonard Schwartz colitis   COLONOSCOPY WITH ESOPHAGOGASTRODUODENOSCOPY (EGD)  04/14/2012   Dr. Gala Romney- EGD= gastric erosions of doubtful clinical significance per bx- chronic inactive gastritis, benign small bowel mucosa. TCS=colonic diverticulosis, tubular adenoma   CORONARY ARTERY BYPASS GRAFT     LIMA-LAD; SVG-OM; SVG-DX in [place]     Cervical laminectomy   PARTIAL HYSTERECTOMY     stents x4       OB History    Gravida  4   Para  4   Term  4   Preterm      AB      Living  4     SAB      IAB      Ectopic      Multiple      Live Births              Family History  Problem Relation Age of Onset   Alcohol abuse Mother    Alcohol abuse Father    Coronary artery disease Other    Hypertension Other    Crohn's disease Sister 95   Stroke Daughter     Social History   Tobacco Use   Smoking status: Former Smoker    Packs/day: 0.50    Years: 50.00    Pack years: 25.00    Types: Cigarettes    Start date: 12/30/2018   Smokeless tobacco: Never Used  Vaping Use   Vaping Use: Never used  Substance Use Topics   Alcohol use: Yes    Alcohol/week: 0.0 standard drinks    Comment: occasionally    Drug use: No    Home Medications Prior to Admission medications   Medication Sig Start Date End Date Taking? Authorizing Provider  traMADol (ULTRAM) 50 MG tablet Take 1 tablet (50 mg total) by mouth every 6 (six) hours as needed for up to 3 days. 05/07/20 05/10/20 Yes Kinnie Feil, PA-C  ALPRAZolam Duanne Moron) 0.5 MG tablet Take 1 tablet (0.5 mg total) by mouth 2 (two) times daily as needed for anxiety. 03/29/20   Lindell Spar, MD  aspirin EC 81 MG  tablet Take 81 mg by mouth daily.    [provider]  buPROPion (WELLBUTRIN XL) 150 MG 24 hr tablet TAKE 1 TABLET BY MOUTH IN  THE MORNING AND AT BEDTIME 04/14/20   Lindell Spar, MD  clopidogrel (PLAVIX) 75 MG tablet Take 75 mg by mouth daily.      [provider]  gabapentin (NEURONTIN) 300 MG capsule Take 1  capsule (300 mg total) by mouth 2 (two) times daily. 04/14/20   Lindell Spar, MD  isosorbide mononitrate (IMDUR) 60 MG 24 hr tablet Take 60 mg by mouth daily.  10/04/17   [provider]  ketoconazole (NIZORAL) 2 % shampoo Apply 1 application topically 2 (two) times a week. 03/14/20   Lindell Spar, MD  levothyroxine (SYNTHROID) 50 MCG tablet Take 1 tablet (50 mcg total) by mouth daily before breakfast. 03/08/20   Lindell Spar, MD  metoprolol tartrate (LOPRESSOR) 25 MG tablet Take 0.5 tablets (12.5 mg total) by mouth 2 (two) times daily. 07/31/19   Satira Sark, MD  nitroGLYCERIN (NITROSTAT) 0.4 MG SL tablet Place 1 tablet (0.4 mg total) under the tongue every 5 (five) minutes as needed. 02/16/11   Satira Sark, MD  ondansetron (ZOFRAN-ODT) 4 MG disintegrating tablet Take 1 tablet (4 mg total) by mouth every 8 (eight) hours as needed for nausea or vomiting. 10/15/19   Pahwani, Michell Heinrich, MD  pantoprazole (PROTONIX) 40 MG tablet Take 1 tablet (40 mg total) by mouth daily. 11/26/19   Pahwani, Michell Heinrich, MD  polyethylene glycol (MIRALAX / GLYCOLAX) 17 g packet Take 17 g by mouth daily.    [provider]  pravastatin (PRAVACHOL) 80 MG tablet Take 1 tablet (80 mg total) by mouth daily. 11/26/19   Pahwani, Michell Heinrich, MD  simethicone (MYLICON) 884 MG chewable tablet Chew 1 tablet (125 mg total) by mouth every 6 (six) hours as needed for flatulence. 11/26/19   Pahwani, Michell Heinrich, MD  SPIRIVA HANDIHALER 18 MCG inhalation capsule 1 capsule daily. 07/02/19   [provider]  Tiotropium Bromide-Olodaterol (STIOLTO RESPIMAT) 2.5-2.5 MCG/ACT AERS Inhale 2 puffs  into the lungs daily. 09/27/17   Collene Gobble, MD  torsemide (DEMADEX) 20 MG tablet Take 2 tablets (40 mg total) by mouth as needed. 06/26/19   Satira Sark, MD  triamcinolone cream (KENALOG) 0.1 % Apply 1 application topically 2 (two) times daily. 03/11/20   Lindell Spar, MD  zolpidem (AMBIEN) 10 MG tablet Take 10 mg by mouth at bedtime as needed for sleep.    [provider]    Allergies    Patient has no known allergies.  Review of Systems   Review of Systems  Musculoskeletal: Positive for arthralgias, gait problem and joint swelling.  Neurological:       Tingling   All other systems reviewed and are negative.   Physical Exam Updated Vital Signs BP (!) 149/53    Pulse 65    Temp 99.2 F (37.3 C) (Oral)    Resp 18    Ht 5\' 4"  (1.626 m)    Wt 108 kg    SpO2 95%    BMI 40.85 kg/m   Physical Exam Vitals and nursing note reviewed.  Constitutional:      General: She is not in acute distress.    Appearance: She is well-developed and well-nourished.     Comments: NAD.  HENT:     Head: Normocephalic and atraumatic.     Right Ear: External ear normal.     Left Ear: External ear normal.     Nose: Nose normal.  Eyes:     Extraocular Movements: EOM normal.     Conjunctiva/sclera: Conjunctivae normal.  Cardiovascular:     Rate and Rhythm: Normal rate and regular rhythm.     Heart sounds: Normal heart sounds.     Comments: 1+ Dp and PT pulses in  RLE. Toes warm. No asymmetric edema of RLE compared to left  Pulmonary:     Effort: Pulmonary effort is normal.     Breath sounds: Normal breath sounds.  Musculoskeletal:        General: No deformity. Normal range of motion.     Cervical back: Normal range of motion and neck supple.     Comments: Spine: low L spine midline and right sided tenderness. Surgical scars noted on cervical spine. Skin otherwise normal.  Right hip: TTP right buttock. No pain with hip flexion, IR, ER. Negative SLR  Right knee: pain with knee  flexion. Mild TTP popliteal space, no fullness. No laxity of joint. No reproducible patellar, quad/patellar tendon tenderness. No joint effusion. Calf non tender. No asymmetric edema of calf  Right ankle/foot: full ROM of ankle without pain. No focal bony tenderness. No edema.   Skin:    General: Skin is warm and dry.     Capillary Refill: Capillary refill takes less than 2 seconds.  Neurological:     Mental Status: She is alert and oriented to person, place, and time.     Comments: Sensation and strength intact in RLE   Psychiatric:        Mood and Affect: Mood and affect normal.        Behavior: Behavior normal.        Thought Content: Thought content normal.        Judgment: Judgment normal.     ED Results / Procedures / Treatments   Labs (all labs ordered are listed, but only abnormal results are displayed) Labs Reviewed  CBG MONITORING, ED    EKG None  Radiology DG Knee Complete 4 Views Right  Result Date: 05/07/2020 CLINICAL DATA:  Right knee pain. EXAM: RIGHT KNEE - COMPLETE 4+ VIEW COMPARISON:  None. FINDINGS: Mild chondrocalcinosis seen in the lateral medial compartments. Mild tricompartmental degenerative changes. No fractures. Possible small effusion. IMPRESSION: Possible small effusion. Mild tricompartmental degenerative changes. Chondrocalcinosis consistent with CPPD. Electronically Signed   By: Dorise Bullion III M.D   On: 05/07/2020 18:14    Procedures Procedures (including critical care time)  Medications Ordered in ED Medications  oxyCODONE-acetaminophen (PERCOCET/ROXICET) 5-325 MG per tablet 1 tablet (1 tablet Oral Given 05/07/20 1757)  acetaminophen (TYLENOL) tablet 650 mg (650 mg Oral Given 05/07/20 1757)    ED Course  I have reviewed the triage vital signs and the nursing notes.  Pertinent labs & imaging results that were available during my care of the patient were reviewed by me and considered in my medical decision making (see chart for  details).  Clinical Course as of 05/07/20 1857  Sat May 07, 2020  1854 DG Knee Complete 4 Views Right IMPRESSION: Possible small effusion. Mild tricompartmental degenerative changes. Chondrocalcinosis consistent with CPPD. [CG]    Clinical Course User Index [CG] Arlean Hopping   MDM Rules/Calculators/A&P                           75 y.o. yo with chief complaint of right knee pain, radiating to right buttock, right foot onset December 25.    Previous medical records available, triage and nursing notes reviewed to obtain more history and assist with MDM  History of mesenteric ischemia s/p short segment right iliac artery to superior mesenteric artery bypass 06/2019.    Differential diagnosis: MSk etiology including soft tissue injury of knee like meniscal or ligamentous injury, baker's cyst,  OA.  Exam is most consistent with MSk etiology given reproducible pain with knee flexion, popliteal space tenderness.  Could be sciatica but less likely. Normal pulses and neuro distally. No skin changes to suggest abscess, cellulitis. Considered ischemic process but clinically this is unlikely. DVT possible but no calf edema, tenderness. No history of gout.    Ordered knee x-ray. Vascular ultrasound not available in ER this weekend.   Imaging reveals - small effusion, degenerative changes and chondrocalcinosis consistent with ?CPPD. Patient without history of gout.   Medications ordered - tylenol, oxycodone  Re-evaluated the patient.   Reports improvement in pain. Updated on x-ray findings.   Suspect MSk etiology. Less likely gout, DVT. I have ordered vascular US, patient will be contacted to come back for this in the next 1-2 day. Will hold off on lovenox tonight as my suspicion for DVT is low. Discussed with EDP.  Instructed patient to follow up with PCP in 1-2 weeks for continued pain, may need ortho/MRI. Patient and son agreeable with plan.    Final Clinical Impression(s) / ED  Diagnoses Final diagnoses:  Acute pain of right knee    Rx / DC Orders ED Discharge Orders         Ordered    traMADol (ULTRAM) 50 MG tablet  Every 6 hours PRN        05/07/20 1846    US Venous Img Lower Unilateral Right        05/07/20 1847           Kinnie Feil, PA-C 05/07/20 1857    Fredia Sorrow, MD 05/08/20 2144

## 2020-05-07 NOTE — ED Triage Notes (Signed)
Patient c/o right leg pain with swelling that started on 04/23/2020. Per patient pain getting worse. Patient states pain worse with walking. Per patient now has to use walker. Pedal pulse present, slight swelling noted in right ankle foot temp WNL.

## 2020-05-07 NOTE — Discharge Instructions (Signed)
You were seen in the emergency department for right knee pain  X-ray today showed some degenerative changes of your knee which is likely from wear and tear through the years.  There is also a small amount of fluid in the area.  This typically means there has been some type of soft tissue injury in the joint.  I suspect you may have over stretched the ligament or injured the meniscus  Take Tylenol as needed for pain mild or moderate pain.  Take tramadol 50 mg every 6 hours or as needed for more severe breakthrough pain.  Elevate your leg.  Ice.  Follow-up with your primary care doctor in 1 week if symptoms have not improved  Return to the ED for worsening or more severe pain, leg swelling, calf pain, loss of sensation, discoloration or coolness to the extremity, chest pain or shortness of breath  I have placed an order for an ultrasound to be done of your right leg.  You will be called by the department to schedule an appointment to come back for this to be done likely in the next 1 to 2 days.  This ultrasound is done to rule out a blood clot in your leg

## 2020-05-07 NOTE — ED Notes (Signed)
Pt in xray

## 2020-05-09 ENCOUNTER — Encounter: Payer: Self-pay | Admitting: Nurse Practitioner

## 2020-05-09 ENCOUNTER — Other Ambulatory Visit: Payer: Self-pay

## 2020-05-09 ENCOUNTER — Ambulatory Visit (INDEPENDENT_AMBULATORY_CARE_PROVIDER_SITE_OTHER): Payer: Medicare Other | Admitting: Nurse Practitioner

## 2020-05-09 DIAGNOSIS — M79604 Pain in right leg: Secondary | ICD-10-CM | POA: Insufficient documentation

## 2020-05-09 NOTE — Patient Instructions (Signed)
Please follow-up with orthopedics related to your back and leg pain. I will refill the tramadol if you are unable to get a quick appointment with ortho.

## 2020-05-09 NOTE — Progress Notes (Signed)
Acute Office Visit  Subjective:    Patient ID: [name], female    DOB: [birth year], 75 y.o.   MRN: [id number]  Chief Complaint  Patient presents with  . Knee Pain    R knee pain x 2 weeks; seen at the ER on Saturday.    HPI Patient is in today for right knee pain.  She was seen in the ED on 05/07/20.    Her pain began on December 25.  This was sudden.  Patient was trying to sit down on a low stool when she felt a sudden pain that she describes as a "charley horse" in the back of her knee.  The pain is constant at rest but much worse when she tries to put weight on it, walk or bend her knee.  It feels like her kneecap is rubbing against something.  Over the last few days the pain has worsened and is shooting up into her right buttock, low back as well as her calf and ankle and foot especially when she puts weight on the right leg.   Ultrasound was not available when she was in the ED, and she was supposed to have a f/u U/S. SHe has orders for this, but came to our office today and will get U/S tomorrow or in the near future.  Past Medical History:  Diagnosis Date  . Acute ischemic colitis ([place]) 09/11/2005  . Anxiety   . Arthritis   . Atherosclerotic vascular disease    Calcified plaque at the origin of the celiac and SMA  . CHF (congestive heart failure) ([place])   . Coronary atherosclerosis of native coronary artery    Mulitvessel LVEF 50-50%, DES circ 1/11, occluded SVG to OM and SVG to diagonal , LVEF 60%  . Depression   . Diverticulosis of colon   . Essential hypertension   . Hypothyroidism   . Mesenteric ischemia ([place])   . Mitral regurgitation    Mild to moderate  . Mixed hyperlipidemia   . Myocardial infarction ([place]) 1990  . Obesity   . Orthostatic hypotension   . PAD (peripheral artery disease) ([place])   . PVC's (premature ventricular contractions)   . Schizophrenia ([place])   . Sleep apnea    CPAP  . Tubular adenoma   . Type 2 diabetes mellitus ([place])     Past  Surgical History:  Procedure Laterality Date  . BACK SURGERY     Lumbar spine surgery  . Carotid endarectomy Bilateral   . CARPAL TUNNEL RELEASE     Bilateral  . CATARACT EXTRACTION W/PHACO Right 08/24/2013   Procedure: CATARACT EXTRACTION PHACO AND INTRAOCULAR LENS PLACEMENT (IOC);  Surgeon: Tonny Branch, MD;  Location: [facility];  Service: Ophthalmology;  Laterality: Right;  CDE 8.68  . COLONOSCOPY  09/14/2005   Dr. Leonard Schwartz colitis  . COLONOSCOPY WITH ESOPHAGOGASTRODUODENOSCOPY (EGD)  04/14/2012   Dr. Gala Romney- EGD= gastric erosions of doubtful clinical significance per bx- chronic inactive gastritis, benign small bowel mucosa. TCS=colonic diverticulosis, tubular adenoma  . CORONARY ARTERY BYPASS GRAFT     LIMA-LAD; SVG-OM; SVG-DX in 1995 [place]  . NECK SURGERY     Cervical laminectomy  . PARTIAL HYSTERECTOMY    . stents x4      Family History  Problem Relation Age of Onset  . Alcohol abuse Mother   . Alcohol abuse Father   . Coronary artery disease Other   . Hypertension Other   . Crohn's disease Sister 17  . Stroke Daughter  Social History   Socioeconomic History  . Marital status: Widowed    Spouse name: Not on file  . Number of children: 4  . Years of education: Not on file  . Highest education level: Not on file  Occupational History  . Occupation: DISABLED    Employer: UNEMPLOYED  Tobacco Use  . Smoking status: Former Smoker    Packs/day: 0.50    Years: 50.00    Pack years: 25.00    Types: Cigarettes    Start date: 12/30/2018  . Smokeless tobacco: Never Used  Vaping Use  . Vaping Use: Never used  Substance and Sexual Activity  . Alcohol use: Yes    Alcohol/week: 0.0 standard drinks    Comment: occasionally   . Drug use: No  . Sexual activity: Not on file  Other Topics Concern  . Not on file  Social History Narrative   LIves alone   Social Determinants of Health   Financial Resource Strain: Not on file  Food Insecurity: Not on file   Transportation Needs: Not on file  Physical Activity: Not on file  Stress: Not on file  Social Connections: Not on file  Intimate Partner Violence: Not on file    Outpatient Medications Prior to Visit  Medication Sig Dispense Refill  . ALPRAZolam (XANAX) 0.5 MG tablet Take 1 tablet (0.5 mg total) by mouth 2 (two) times daily as needed for anxiety. 60 tablet 0  . aspirin EC 81 MG tablet Take 81 mg by mouth daily.    Marland Kitchen buPROPion (WELLBUTRIN XL) 150 MG 24 hr tablet TAKE 1 TABLET BY MOUTH IN  THE MORNING AND AT BEDTIME 180 tablet 0  . clopidogrel (PLAVIX) 75 MG tablet Take 75 mg by mouth daily.    Marland Kitchen gabapentin (NEURONTIN) 300 MG capsule Take 1 capsule (300 mg total) by mouth 2 (two) times daily. 180 capsule 0  . isosorbide mononitrate (IMDUR) 60 MG 24 hr tablet Take 60 mg by mouth daily.     Marland Kitchen ketoconazole (NIZORAL) 2 % shampoo Apply 1 application topically 2 (two) times a week. 120 mL 0  . levothyroxine (SYNTHROID) 50 MCG tablet Take 1 tablet (50 mcg total) by mouth daily before breakfast. 90 tablet 0  . metoprolol tartrate (LOPRESSOR) 25 MG tablet Take 0.5 tablets (12.5 mg total) by mouth 2 (two) times daily. 90 tablet 3  . nitroGLYCERIN (NITROSTAT) 0.4 MG SL tablet Place 1 tablet (0.4 mg total) under the tongue every 5 (five) minutes as needed. 90 tablet 3  . ondansetron (ZOFRAN-ODT) 4 MG disintegrating tablet Take 1 tablet (4 mg total) by mouth every 8 (eight) hours as needed for nausea or vomiting. 20 tablet 0  . pantoprazole (PROTONIX) 40 MG tablet Take 1 tablet (40 mg total) by mouth daily. 90 tablet 1  . polyethylene glycol (MIRALAX / GLYCOLAX) 17 g packet Take 17 g by mouth daily.    . pravastatin (PRAVACHOL) 80 MG tablet Take 1 tablet (80 mg total) by mouth daily. 90 tablet 1  . simethicone (MYLICON) 0000000 MG chewable tablet Chew 1 tablet (125 mg total) by mouth every 6 (six) hours as needed for flatulence. 30 tablet 0  . SPIRIVA HANDIHALER 18 MCG inhalation capsule 1 capsule daily.     . Tiotropium Bromide-Olodaterol (STIOLTO RESPIMAT) 2.5-2.5 MCG/ACT AERS Inhale 2 puffs into the lungs daily. 1 Inhaler 0  . torsemide (DEMADEX) 20 MG tablet Take 2 tablets (40 mg total) by mouth as needed. 30 tablet 0  . traMADol (ULTRAM)  50 MG tablet Take 1 tablet (50 mg total) by mouth every 6 (six) hours as needed for up to 3 days. 12 tablet 0  . triamcinolone cream (KENALOG) 0.1 % Apply 1 application topically 2 (two) times daily. 30 g 0  . zolpidem (AMBIEN) 10 MG tablet Take 10 mg by mouth at bedtime as needed for sleep.     No facility-administered medications prior to visit.    No Known Allergies  Review of Systems  Constitutional: Negative.   Respiratory: Negative.   Cardiovascular: Negative.   Musculoskeletal: Positive for arthralgias and back pain.       Pain to right lower back, hip, and knee       Objective:    Physical Exam Constitutional:      Appearance: She is obese.  Musculoskeletal:        General: Swelling and tenderness present.     Comments: Right knee has some mild swelling, tender to medial aspect of right knee, right lower back, and right hip  Neurological:     Mental Status: She is alert.     BP 133/73   Pulse 70   Temp 97.9 F (36.6 C)   Resp 16   Ht 5\' 4"  (1.626 m)   Wt 216 lb (98 kg)   SpO2 95%   BMI 37.08 kg/m  Wt Readings from Last 3 Encounters:  05/09/20 216 lb (98 kg)  05/07/20 238 lb (108 kg)  02/11/20 228 lb 12.8 oz (103.8 kg)    Health Maintenance Due  Topic Date Due  . Hepatitis C Screening  Never done  . FOOT EXAM  Never done  . OPHTHALMOLOGY EXAM  Never done  . TETANUS/TDAP  Never done  . DEXA SCAN  Never done  . PNA vac Low Risk Adult (1 of 2 - PCV13) Never done  . HEMOGLOBIN A1C  03/30/2020    There are no preventive care reminders to display for this patient.   Lab Results  Component Value Date   TSH 8.55 (H) 10/15/2019   Lab Results  Component Value Date   WBC 7.4 10/15/2019   HGB 9.0 (L) 10/15/2019    HCT 29.0 (L) 10/15/2019   MCV 97.3 10/15/2019   PLT 255 10/15/2019   Lab Results  Component Value Date   NA 138 10/15/2019   K 4.5 10/15/2019   CO2 28 10/15/2019   GLUCOSE 104 10/15/2019   BUN 15 10/15/2019   CREATININE 1.04 (H) 10/15/2019   BILITOT 0.3 10/15/2019   ALKPHOS 59 09/30/2019   AST 21 10/15/2019   ALT 12 10/15/2019   PROT 5.8 (L) 10/15/2019   ALBUMIN 2.2 (L) 09/30/2019   CALCIUM 8.2 (L) 10/15/2019   ANIONGAP 5 10/02/2019   Lab Results  Component Value Date   CHOL 142 08/19/2017   Lab Results  Component Value Date   HDL 45 08/19/2017   Lab Results  Component Value Date   LDLCALC 66 08/19/2017   Lab Results  Component Value Date   TRIG 154 (H) 08/19/2017   Lab Results  Component Value Date   CHOLHDL 3.2 08/19/2017   Lab Results  Component Value Date   HGBA1C 6.6 (H) 09/29/2019       Assessment & Plan:   Problem List Items Addressed This Visit      Other   Right leg pain    -unsure of etiology -has right lower back, hip, and knee pain after sitting in a low chair on 04/23/20 -negative straight  leg raise, so not likely sciatica -she takes plavix, so no NSAIDs -per pt, she has 5 tramadol left from her ER prescription; would refill if it takes a while to be seen by ortho -has U/S ordered to r/o DVT, but her pain seems more MSK related -refer to ortho      Relevant Orders   Ambulatory referral to Orthopedic Surgery       No orders of the defined types were placed in this encounter.    Noreene Larsson, NP

## 2020-05-09 NOTE — Assessment & Plan Note (Addendum)
-  unsure of etiology -has right lower back, hip, and knee pain after sitting in a low chair on 04/23/20 -negative straight leg raise, so not likely sciatica -she takes plavix, so no NSAIDs -per pt, she has 5 tramadol left from her ER prescription; would refill if it takes a while to be seen by ortho -has U/S ordered to r/o DVT, but her pain seems more MSK related -refer to ortho

## 2020-05-10 ENCOUNTER — Ambulatory Visit (INDEPENDENT_AMBULATORY_CARE_PROVIDER_SITE_OTHER): Payer: Medicare Other | Admitting: Orthopaedic Surgery

## 2020-05-10 ENCOUNTER — Encounter: Payer: Self-pay | Admitting: Orthopaedic Surgery

## 2020-05-10 VITALS — BP 130/62 | HR 71 | Ht 64.0 in | Wt 216.0 lb

## 2020-05-10 DIAGNOSIS — M25561 Pain in right knee: Secondary | ICD-10-CM | POA: Diagnosis not present

## 2020-05-10 DIAGNOSIS — G8929 Other chronic pain: Secondary | ICD-10-CM

## 2020-05-10 MED ORDER — HYDROCODONE-ACETAMINOPHEN 5-325 MG PO TABS: ORAL_TABLET | ORAL | 0 refills | Status: DC

## 2020-05-10 NOTE — Progress Notes (Signed)
Subjective:    Patient ID: [name], female    DOB: Nov 05, 1945, 75 y.o.   MRN: [id number]  HPI She has had pain in the right knee for several months getting worse recently. She has popping swelling and giving way.  She has been seen at [facility].  I have reviewed the notes.  She has had X-rays showing:  IMPRESSION: Possible small effusion. Mild tricompartmental degenerative changes. Chondrocalcinosis consistent with CPPD.  I have independently reviewed and interpreted x-rays of this patient done at another site by another physician or qualified health professional.  She is not getting any better.  Her knee gives way.  Nothing seems to help it.     Review of Systems  Constitutional: Positive for activity change.  Musculoskeletal: Positive for arthralgias, back pain, gait problem and joint swelling.  All other systems reviewed and are negative.  For Review of Systems, all other systems reviewed and are negative.  The following is a summary of the past history medically, past history surgically, known current medicines, social history and family history.  This information is gathered electronically by the computer from prior information and documentation.  I review this each visit and have found including this information at this point in the chart is beneficial and informative.   Past Medical History:  Diagnosis Date  . Acute ischemic colitis ([place]) 09/11/2005  . Anxiety   . Arthritis   . Atherosclerotic vascular disease    Calcified plaque at the origin of the celiac and SMA  . CHF (congestive heart failure) ([place])   . Coronary atherosclerosis of native coronary artery    Mulitvessel LVEF 50-50%, DES circ 1/11, occluded SVG to OM and SVG to diagonal , LVEF 60%  . Depression   . Diverticulosis of colon   . Essential hypertension   . Hypothyroidism   . Mesenteric ischemia ([place])   . Mitral regurgitation    Mild to moderate  . Mixed hyperlipidemia   .  Myocardial infarction ([place]) 1990  . Obesity   . Orthostatic hypotension   . PAD (peripheral artery disease) ([place])   . PVC's (premature ventricular contractions)   . Schizophrenia ([place])   . Sleep apnea    CPAP  . Tubular adenoma   . Type 2 diabetes mellitus ([place])     Past Surgical History:  Procedure Laterality Date  . BACK SURGERY     Lumbar spine surgery  . Carotid endarectomy Bilateral   . CARPAL TUNNEL RELEASE     Bilateral  . CATARACT EXTRACTION W/PHACO Right 08/24/2013   Procedure: CATARACT EXTRACTION PHACO AND INTRAOCULAR LENS PLACEMENT (IOC);  Surgeon: Tonny Branch, MD;  Location: [facility];  Service: Ophthalmology;  Laterality: Right;  CDE 8.68  . COLONOSCOPY  09/14/2005   Dr. Leonard Schwartz colitis  . COLONOSCOPY WITH ESOPHAGOGASTRODUODENOSCOPY (EGD)  04/14/2012   Dr. Gala Romney- EGD= gastric erosions of doubtful clinical significance per bx- chronic inactive gastritis, benign small bowel mucosa. TCS=colonic diverticulosis, tubular adenoma  . CORONARY ARTERY BYPASS GRAFT     LIMA-LAD; SVG-OM; SVG-DX in 1995 [place]  . NECK SURGERY     Cervical laminectomy  . PARTIAL HYSTERECTOMY    . stents x4      Current Outpatient Medications on File Prior to Visit  Medication Sig Dispense Refill  . ALPRAZolam (XANAX) 0.5 MG tablet Take 1 tablet (0.5 mg total) by mouth 2 (two) times daily as needed for anxiety. 60 tablet 0  . aspirin EC 81 MG tablet Take 81  mg by mouth daily.    Marland Kitchen buPROPion (WELLBUTRIN XL) 150 MG 24 hr tablet TAKE 1 TABLET BY MOUTH IN  THE MORNING AND AT BEDTIME 180 tablet 0  . clopidogrel (PLAVIX) 75 MG tablet Take 75 mg by mouth daily.    Marland Kitchen gabapentin (NEURONTIN) 300 MG capsule Take 1 capsule (300 mg total) by mouth 2 (two) times daily. 180 capsule 0  . isosorbide mononitrate (IMDUR) 60 MG 24 hr tablet Take 60 mg by mouth daily.     Marland Kitchen ketoconazole (NIZORAL) 2 % shampoo Apply 1 application topically 2 (two) times a week. 120 mL 0  . levothyroxine (SYNTHROID) 50 MCG tablet  Take 1 tablet (50 mcg total) by mouth daily before breakfast. 90 tablet 0  . metoprolol tartrate (LOPRESSOR) 25 MG tablet Take 0.5 tablets (12.5 mg total) by mouth 2 (two) times daily. 90 tablet 3  . nitroGLYCERIN (NITROSTAT) 0.4 MG SL tablet Place 1 tablet (0.4 mg total) under the tongue every 5 (five) minutes as needed. 90 tablet 3  . pantoprazole (PROTONIX) 40 MG tablet Take 1 tablet (40 mg total) by mouth daily. 90 tablet 1  . polyethylene glycol (MIRALAX / GLYCOLAX) 17 g packet Take 17 g by mouth daily.    . pravastatin (PRAVACHOL) 80 MG tablet Take 1 tablet (80 mg total) by mouth daily. 90 tablet 1  . simethicone (MYLICON) 347 MG chewable tablet Chew 1 tablet (125 mg total) by mouth every 6 (six) hours as needed for flatulence. 30 tablet 0  . SPIRIVA HANDIHALER 18 MCG inhalation capsule 1 capsule daily.    . Tiotropium Bromide-Olodaterol (STIOLTO RESPIMAT) 2.5-2.5 MCG/ACT AERS Inhale 2 puffs into the lungs daily. 1 Inhaler 0  . torsemide (DEMADEX) 20 MG tablet Take 2 tablets (40 mg total) by mouth as needed. 30 tablet 0  . traMADol (ULTRAM) 50 MG tablet Take 1 tablet (50 mg total) by mouth every 6 (six) hours as needed for up to 3 days. 12 tablet 0  . triamcinolone cream (KENALOG) 0.1 % Apply 1 application topically 2 (two) times daily. 30 g 0  . zolpidem (AMBIEN) 10 MG tablet Take 10 mg by mouth at bedtime as needed for sleep.    Marland Kitchen ondansetron (ZOFRAN-ODT) 4 MG disintegrating tablet Take 1 tablet (4 mg total) by mouth every 8 (eight) hours as needed for nausea or vomiting. (Patient not taking: Reported on 05/10/2020) 20 tablet 0   No current facility-administered medications on file prior to visit.    Social History   Socioeconomic History  . Marital status: Widowed    Spouse name: Not on file  . Number of children: 4  . Years of education: Not on file  . Highest education level: Not on file  Occupational History  . Occupation: DISABLED    Employer: UNEMPLOYED  Tobacco Use  .  Smoking status: Former Smoker    Packs/day: 0.50    Years: 50.00    Pack years: 25.00    Types: Cigarettes    Start date: 12/30/2018  . Smokeless tobacco: Never Used  Vaping Use  . Vaping Use: Never used  Substance and Sexual Activity  . Alcohol use: Yes    Alcohol/week: 0.0 standard drinks    Comment: occasionally   . Drug use: No  . Sexual activity: Not on file  Other Topics Concern  . Not on file  Social History Narrative   LIves alone   Social Determinants of Health   Financial Resource Strain: Not on file  Food Insecurity: Not on file  Transportation Needs: Not on file  Physical Activity: Not on file  Stress: Not on file  Social Connections: Not on file  Intimate Partner Violence: Not on file    Family History  Problem Relation Age of Onset  . Alcohol abuse Mother   . Alcohol abuse Father   . Coronary artery disease Other   . Hypertension Other   . Crohn's disease Sister 51  . Stroke Daughter     BP 130/62   Pulse 71   Ht 5\' 4"  (1.626 m)   Wt 216 lb (98 kg)   BMI 37.08 kg/m   Body mass index is 37.08 kg/m.      Objective:   Physical Exam Vitals and nursing note reviewed. Exam conducted with a chaperone present.  Constitutional:      Appearance: She is well-developed and well-nourished.  HENT:     Head: Normocephalic and atraumatic.  Eyes:     Extraocular Movements: EOM normal.     Conjunctiva/sclera: Conjunctivae normal.     Pupils: Pupils are equal, round, and reactive to light.  Cardiovascular:     Rate and Rhythm: Normal rate and regular rhythm.     Pulses: Intact distal pulses.  Pulmonary:     Effort: Pulmonary effort is normal.  Abdominal:     Palpations: Abdomen is soft.  Musculoskeletal:     Cervical back: Normal range of motion and neck supple.       Legs:  Skin:    General: Skin is warm and dry.  Neurological:     Mental Status: She is alert and oriented to person, place, and time.     Cranial Nerves: No cranial nerve deficit.      Motor: No abnormal muscle tone.     Coordination: Coordination normal.     Deep Tendon Reflexes: Reflexes are normal and symmetric. Reflexes normal.  Psychiatric:        Mood and Affect: Mood and affect normal.        Behavior: Behavior normal.        Thought Content: Thought content normal.        Judgment: Judgment normal.           Assessment & Plan:   Encounter Diagnosis  Name Primary?  . Chronic pain of right knee Yes   I am concerned about medial meniscus tear.  I will get MRI.  PROCEDURE NOTE:  The patient requests injections of the right knee , verbal consent was obtained.  The right knee was prepped appropriately after time out was performed.   Sterile technique was observed and injection of 1 cc of Depo-Medrol 40 mg with several cc's of plain xylocaine. Anesthesia was provided by ethyl chloride and a 20-gauge needle was used to inject the knee area. The injection was tolerated well.  A band aid dressing was applied.  The patient was advised to apply ice later today and tomorrow to the injection sight as needed.  Return in two weeks.    Call if any problem.  Precautions discussed.   Electronically Signed Sanjuana Kava, MD 1/11/20223:33 PM

## 2020-05-11 ENCOUNTER — Other Ambulatory Visit: Payer: Self-pay | Admitting: Internal Medicine

## 2020-05-11 DIAGNOSIS — E039 Hypothyroidism, unspecified: Secondary | ICD-10-CM

## 2020-05-13 ENCOUNTER — Telehealth (INDEPENDENT_AMBULATORY_CARE_PROVIDER_SITE_OTHER): Payer: Medicare Other | Admitting: Internal Medicine

## 2020-05-13 ENCOUNTER — Encounter: Payer: Self-pay | Admitting: Internal Medicine

## 2020-05-13 ENCOUNTER — Other Ambulatory Visit: Payer: Self-pay

## 2020-05-13 VITALS — BP 172/78 | Wt 238.0 lb

## 2020-05-13 DIAGNOSIS — E039 Hypothyroidism, unspecified: Secondary | ICD-10-CM

## 2020-05-13 DIAGNOSIS — M1710 Unilateral primary osteoarthritis, unspecified knee: Secondary | ICD-10-CM | POA: Insufficient documentation

## 2020-05-13 DIAGNOSIS — I10 Essential (primary) hypertension: Secondary | ICD-10-CM

## 2020-05-13 NOTE — Patient Instructions (Signed)
Please get fasting blood tests done before the next visit.  Please continue to follow up with your Orthopedic surgeon for knee pain evaluation.  Please continue to take medications as prescribed.

## 2020-05-13 NOTE — Assessment & Plan Note (Signed)
Lab Results  Component Value Date   TSH 8.55 (H) 10/15/2019   On Levothyroxine 50 mcg recently Check TSH and free T4, adjust dose accordingly 

## 2020-05-13 NOTE — Progress Notes (Signed)
Virtual Visit via Telephone Note   This visit type was conducted due to national recommendations for restrictions regarding the COVID-19 Pandemic (e.g. social distancing) in an effort to limit this patient's exposure and mitigate transmission in our community.  Due to her co-morbid illnesses, this patient is at least at moderate risk for complications without adequate follow up.  This format is felt to be most appropriate for this patient at this time.  The patient did not have access to video technology/had technical difficulties with video requiring transitioning to audio format only (telephone).  All issues noted in this document were discussed and addressed.  No physical exam could be performed with this format.  Evaluation Performed:  Follow-up visit  Date:  05/13/2020   ID:  [name], DOB 1945/12/19, MRN [id number]  Patient Location: Home Provider Location: Office/Clinic  Participants: Patient Location of Patient: Home Location of Provider: Telehealth Consent was obtain for visit to be over via telehealth. I verified that I am speaking with the correct person using two identifiers.  PCP:  Lindell Spar, MD   Chief Complaint: Follow up of BP and knee pain  History of Present Illness:    [name] is a 75 y.o. female with past medical history of hypertension, hyperlipidemia, mesenteric ischemia status post right hemicolectomy, coronary artery disease, hypothyroidism, type 2 diabetes mellitus, depression with anxiety, obesity, arthritis, chronic back pain who has a televisit for follow up of her HTN and knee pain.  Her blood pressure has been high at home, but from the review from last few office visits, her BP was within normal limits. She denies any headache, dizziness, chest pain, dyspnea or palpitations. She is advised to bring the BP device to the office in the next visit.  She has been having constant right knee pain. She was evaluated by Orthopedic  surgeon and was given steroid injection. She is taking Norco PRN for severe pain.  She has been taking Levothyroxine regularly. She needs to have blood tests done.  The patient does not have symptoms concerning for COVID-19 infection (fever, chills, cough, or new shortness of breath).   Past Medical, Surgical, Social History, Allergies, and Medications have been Reviewed.  Past Medical History:  Diagnosis Date  . Acute ischemic colitis ([place]) 09/11/2005  . Anxiety   . Arthritis   . Atherosclerotic vascular disease    Calcified plaque at the origin of the celiac and SMA  . CHF (congestive heart failure) ([place])   . Coronary atherosclerosis of native coronary artery    Mulitvessel LVEF 50-50%, DES circ 1/11, occluded SVG to OM and SVG to diagonal , LVEF 60%  . Depression   . Diverticulosis of colon   . Essential hypertension   . Hypothyroidism   . Mesenteric ischemia ([place])   . Mitral regurgitation    Mild to moderate  . Mixed hyperlipidemia   . Myocardial infarction ([place]) 1990  . Obesity   . Orthostatic hypotension   . PAD (peripheral artery disease) ([place])   . PVC's (premature ventricular contractions)   . Schizophrenia ([place])   . Sleep apnea    CPAP  . Tubular adenoma   . Type 2 diabetes mellitus ([place])    Past Surgical History:  Procedure Laterality Date  . BACK SURGERY     Lumbar spine surgery  . Carotid endarectomy Bilateral   . CARPAL TUNNEL RELEASE     Bilateral  . CATARACT EXTRACTION W/PHACO Right 08/24/2013   Procedure: CATARACT  EXTRACTION PHACO AND INTRAOCULAR LENS PLACEMENT (IOC);  Surgeon: Tonny Branch, MD;  Location: [facility];  Service: Ophthalmology;  Laterality: Right;  CDE 8.68  . COLONOSCOPY  09/14/2005   Dr. Leonard Schwartz colitis  . COLONOSCOPY WITH ESOPHAGOGASTRODUODENOSCOPY (EGD)  04/14/2012   Dr. Gala Romney- EGD= gastric erosions of doubtful clinical significance per bx- chronic inactive gastritis, benign small bowel mucosa. TCS=colonic diverticulosis, tubular  adenoma  . CORONARY ARTERY BYPASS GRAFT     LIMA-LAD; SVG-OM; SVG-DX in 1995 [place]  . NECK SURGERY     Cervical laminectomy  . PARTIAL HYSTERECTOMY    . stents x4       Current Meds  Medication Sig  . ALPRAZolam (XANAX) 0.5 MG tablet Take 1 tablet (0.5 mg total) by mouth 2 (two) times daily as needed for anxiety.  Marland Kitchen aspirin EC 81 MG tablet Take 81 mg by mouth daily.  Marland Kitchen buPROPion (WELLBUTRIN XL) 150 MG 24 hr tablet TAKE 1 TABLET BY MOUTH IN  THE MORNING AND AT BEDTIME  . clopidogrel (PLAVIX) 75 MG tablet Take 75 mg by mouth daily.  Marland Kitchen gabapentin (NEURONTIN) 300 MG capsule Take 1 capsule (300 mg total) by mouth 2 (two) times daily.  Marland Kitchen HYDROcodone-acetaminophen (NORCO/VICODIN) 5-325 MG tablet One tablet every four hours for pain.  . isosorbide mononitrate (IMDUR) 60 MG 24 hr tablet Take 60 mg by mouth daily.   Marland Kitchen ketoconazole (NIZORAL) 2 % shampoo Apply 1 application topically 2 (two) times a week.  . levothyroxine (SYNTHROID) 50 MCG tablet TAKE 1 TABLET BY MOUTH  DAILY BEFORE BREAKFAST  . metoprolol tartrate (LOPRESSOR) 25 MG tablet Take 0.5 tablets (12.5 mg total) by mouth 2 (two) times daily.  . nitroGLYCERIN (NITROSTAT) 0.4 MG SL tablet Place 1 tablet (0.4 mg total) under the tongue every 5 (five) minutes as needed.  . ondansetron (ZOFRAN-ODT) 4 MG disintegrating tablet Take 1 tablet (4 mg total) by mouth every 8 (eight) hours as needed for nausea or vomiting.  . pantoprazole (PROTONIX) 40 MG tablet Take 1 tablet (40 mg total) by mouth daily.  . polyethylene glycol (MIRALAX / GLYCOLAX) 17 g packet Take 17 g by mouth daily.  . pravastatin (PRAVACHOL) 80 MG tablet Take 1 tablet (80 mg total) by mouth daily.  . simethicone (MYLICON) 213 MG chewable tablet Chew 1 tablet (125 mg total) by mouth every 6 (six) hours as needed for flatulence.  Marland Kitchen SPIRIVA HANDIHALER 18 MCG inhalation capsule 1 capsule daily.  . Tiotropium Bromide-Olodaterol (STIOLTO RESPIMAT) 2.5-2.5 MCG/ACT AERS Inhale 2 puffs into  the lungs daily.  Marland Kitchen torsemide (DEMADEX) 20 MG tablet Take 2 tablets (40 mg total) by mouth as needed.  . triamcinolone cream (KENALOG) 0.1 % Apply 1 application topically 2 (two) times daily.  Marland Kitchen zolpidem (AMBIEN) 10 MG tablet Take 10 mg by mouth at bedtime as needed for sleep.     Allergies:   Patient has no known allergies.   ROS:   Please see the history of present illness.    Review of Systems  Constitutional: Negative for chills and fever.  HENT: Negative for congestion, sinus pain and sore throat.   Eyes: Negative for pain and redness.  Respiratory: Positive for cough (chronic). Negative for shortness of breath.   Cardiovascular: Negative for chest pain and palpitations.  Gastrointestinal: Negative for constipation, diarrhea, nausea and vomiting.  Musculoskeletal: Positive for back pain and joint pain (Right knee).  Neurological: Negative for dizziness and focal weakness.  Psychiatric/Behavioral: The patient is nervous/anxious and has insomnia.  Labs/Other Tests and Data Reviewed:    Recent Labs: 10/02/2019: Magnesium 1.6 10/15/2019: ALT 12; BUN 15; Creat 1.04; Hemoglobin 9.0; Platelets 255; Potassium 4.5; Sodium 138; TSH 8.55   Recent Lipid Panel Lab Results  Component Value Date/Time   CHOL 142 08/19/2017 03:05 PM   TRIG 154 (H) 08/19/2017 03:05 PM   HDL 45 08/19/2017 03:05 PM   CHOLHDL 3.2 08/19/2017 03:05 PM   LDLCALC 66 08/19/2017 03:05 PM    Wt Readings from Last 3 Encounters:  05/13/20 238 lb (108 kg)  05/10/20 216 lb (98 kg)  05/09/20 216 lb (98 kg)      ASSESSMENT & PLAN:    Benign essential hypertension BP elevated at home, has been stable during office visits Continue metoprolol and Imdur for now Advised to bring the BP device to the office in the next visit Continue to follow DASH diet   Hypothyroidism Lab Results  Component Value Date   TSH 8.55 (H) 10/15/2019   On Levothyroxine 50 mcg recently Check TSH and free T4, adjust dose  accordingly  Arthritis of knee Chronic right knee pain, recently evaluated by orthopedic surgeon Status post steroid injection Continues to have knee pain, on Norco as needed Planned to have MRI of the knee to rule out meniscal tear    Time:   Today, I have spent 22 minutes reviewing the chart, including problem list, medications, and with the patient with telehealth technology discussing the above problems.   Medication Adjustments/Labs and Tests Ordered: Current medicines are reviewed at length with the patient today.  Concerns regarding medicines are outlined above.   Tests Ordered: Orders Placed This Encounter  Procedures  . CBC with Differential  . CMP14+EGFR  . Hemoglobin A1c  . Lipid Profile  . TSH + free T4  . Vitamin D (25 hydroxy)    Medication Changes: No orders of the defined types were placed in this encounter.    Note: This dictation was prepared with Dragon dictation along with smaller phrase technology. Similar sounding words can be transcribed inadequately or may not be corrected upon review. Any transcriptional errors that result from this process are unintentional.      Disposition:  Follow up  Signed, Lindell Spar, MD  05/13/2020 12:10 PM     [facility]

## 2020-05-13 NOTE — Assessment & Plan Note (Signed)
BP elevated at home, has been stable during office visits Continue metoprolol and Imdur for now Advised to bring the BP device to the office in the next visit Continue to follow DASH diet

## 2020-05-13 NOTE — Assessment & Plan Note (Signed)
Chronic right knee pain, recently evaluated by orthopedic surgeon Status post steroid injection Continues to have knee pain, on Norco as needed Planned to have MRI of the knee to rule out meniscal tear

## 2020-05-24 ENCOUNTER — Other Ambulatory Visit: Payer: Self-pay | Admitting: *Deleted

## 2020-05-24 MED ORDER — PRAVASTATIN SODIUM 80 MG PO TABS: 80.0000 mg | ORAL_TABLET | Freq: Every day | ORAL | 1 refills | Status: DC

## 2020-05-25 ENCOUNTER — Other Ambulatory Visit: Payer: Self-pay

## 2020-05-25 ENCOUNTER — Ambulatory Visit (HOSPITAL_COMMUNITY)
Admission: RE | Admit: 2020-05-25 | Discharge: 2020-05-25 | Disposition: A | Discharge status: Home / Self Care | Payer: Medicare Other | Source: Ambulatory Visit | Attending: Orthopaedic Surgery | Admitting: Orthopaedic Surgery

## 2020-05-25 DIAGNOSIS — M25561 Pain in right knee: Secondary | ICD-10-CM | POA: Insufficient documentation

## 2020-05-25 DIAGNOSIS — G8929 Other chronic pain: Secondary | ICD-10-CM | POA: Diagnosis not present

## 2020-05-29 DIAGNOSIS — R0902 Hypoxemia: Secondary | ICD-10-CM | POA: Diagnosis not present

## 2020-05-29 DIAGNOSIS — G4733 Obstructive sleep apnea (adult) (pediatric): Secondary | ICD-10-CM | POA: Diagnosis not present

## 2020-05-31 ENCOUNTER — Other Ambulatory Visit: Payer: Self-pay

## 2020-05-31 ENCOUNTER — Encounter: Payer: Self-pay | Admitting: Orthopaedic Surgery

## 2020-05-31 ENCOUNTER — Ambulatory Visit (INDEPENDENT_AMBULATORY_CARE_PROVIDER_SITE_OTHER): Payer: Medicare Other | Admitting: Orthopaedic Surgery

## 2020-05-31 DIAGNOSIS — G8929 Other chronic pain: Secondary | ICD-10-CM

## 2020-05-31 DIAGNOSIS — M23203 Derangement of unspecified medial meniscus due to old tear or injury, right knee: Secondary | ICD-10-CM

## 2020-05-31 DIAGNOSIS — M25561 Pain in right knee: Secondary | ICD-10-CM

## 2020-05-31 NOTE — Progress Notes (Signed)
Patient [name] Barrie Lyme, female DOB:08-07-1945, 75 y.o. LU:[id number]  Chief Complaint  Patient presents with  . Knee Pain    MRI of right knee.     HPI  [name] is a 75 y.o. female who has right knee pain and giving way.  She had MRI which showed: IMPRESSION: 1. Radial tear of the posterior horn medial meniscus with adjacent grade 3 signal extending obliquely to the inferior surface. 2. Prominent degenerative chondral thinning in the medial compartment with moderate chondral thinning in the patellofemoral joint and mild chondral thinning in the lateral compartment. 3. Small knee effusion with mildly thickened medial plica. Synovitis along the popliteus recess of the joint. 4. Small to moderate size Baker's cyst. 5. Subtle linear increased signal in the distal quadriceps tendon is likely incidental but in the appropriate clinical setting could represent a quadriceps sprain.  I have explained the findings to her.  I have recommended arthroscopy of the right knee.  I will have her see Dr. Aline Brochure or Dr. Amedeo Kinsman.  She is agreeable to this.  I have independently reviewed the MRI.      There is no height or weight on file to calculate BMI.  ROS  Review of Systems  Constitutional: Positive for activity change.  Musculoskeletal: Positive for arthralgias, back pain, gait problem and joint swelling.  All other systems reviewed and are negative.   All other systems reviewed and are negative.  The following is a summary of the past history medically, past history surgically, known current medicines, social history and family history.  This information is gathered electronically by the computer from prior information and documentation.  I review this each visit and have found including this information at this point in the chart is beneficial and informative.    Past Medical History:  Diagnosis Date  . Acute ischemic colitis ([place]) 09/11/2005  . Anxiety   .  Arthritis   . Atherosclerotic vascular disease    Calcified plaque at the origin of the celiac and SMA  . CHF (congestive heart failure) ([place])   . Coronary atherosclerosis of native coronary artery    Mulitvessel LVEF 50-50%, DES circ 1/11, occluded SVG to OM and SVG to diagonal , LVEF 60%  . Depression   . Diverticulosis of colon   . Essential hypertension   . Hypothyroidism   . Mesenteric ischemia ([place])   . Mitral regurgitation    Mild to moderate  . Mixed hyperlipidemia   . Myocardial infarction ([place]) 1990  . Obesity   . Orthostatic hypotension   . PAD (peripheral artery disease) ([place])   . PVC's (premature ventricular contractions)   . Schizophrenia ([place])   . Sleep apnea    CPAP  . Tubular adenoma   . Type 2 diabetes mellitus ([place])     Past Surgical History:  Procedure Laterality Date  . BACK SURGERY     Lumbar spine surgery  . Carotid endarectomy Bilateral   . CARPAL TUNNEL RELEASE     Bilateral  . CATARACT EXTRACTION W/PHACO Right 08/24/2013   Procedure: CATARACT EXTRACTION PHACO AND INTRAOCULAR LENS PLACEMENT (IOC);  Surgeon: Tonny Branch, MD;  Location: [facility];  Service: Ophthalmology;  Laterality: Right;  CDE 8.68  . COLONOSCOPY  09/14/2005   Dr. Leonard Schwartz colitis  . COLONOSCOPY WITH ESOPHAGOGASTRODUODENOSCOPY (EGD)  04/14/2012   Dr. Gala Romney- EGD= gastric erosions of doubtful clinical significance per bx- chronic inactive gastritis, benign small bowel mucosa. TCS=colonic diverticulosis, tubular adenoma  . CORONARY ARTERY BYPASS  GRAFT     LIMA-LAD; SVG-OM; SVG-DX in [place]  . NECK SURGERY     Cervical laminectomy  . PARTIAL HYSTERECTOMY    . stents x4      Family History  Problem Relation Age of Onset  . Alcohol abuse Mother   . Alcohol abuse Father   . Coronary artery disease Other   . Hypertension Other   . Crohn's disease Sister 53  . Stroke Daughter     Social History Social History   Tobacco Use  . Smoking status: Former Smoker     Packs/day: 0.50    Years: 50.00    Pack years: 25.00    Types: Cigarettes    Start date: 12/30/2018  . Smokeless tobacco: Never Used  Vaping Use  . Vaping Use: Never used  Substance Use Topics  . Alcohol use: Yes    Alcohol/week: 0.0 standard drinks    Comment: occasionally   . Drug use: No    No Known Allergies  Current Outpatient Medications  Medication Sig Dispense Refill  . ALPRAZolam (XANAX) 0.5 MG tablet Take 1 tablet (0.5 mg total) by mouth 2 (two) times daily as needed for anxiety. 60 tablet 0  . aspirin EC 81 MG tablet Take 81 mg by mouth daily.    Marland Kitchen buPROPion (WELLBUTRIN XL) 150 MG 24 hr tablet TAKE 1 TABLET BY MOUTH IN  THE MORNING AND AT BEDTIME 180 tablet 0  . clopidogrel (PLAVIX) 75 MG tablet Take 75 mg by mouth daily.    Marland Kitchen gabapentin (NEURONTIN) 300 MG capsule Take 1 capsule (300 mg total) by mouth 2 (two) times daily. 180 capsule 0  . HYDROcodone-acetaminophen (NORCO/VICODIN) 5-325 MG tablet One tablet every four hours for pain. 30 tablet 0  . isosorbide mononitrate (IMDUR) 60 MG 24 hr tablet Take 60 mg by mouth daily.     Marland Kitchen ketoconazole (NIZORAL) 2 % shampoo Apply 1 application topically 2 (two) times a week. 120 mL 0  . levothyroxine (SYNTHROID) 50 MCG tablet TAKE 1 TABLET BY MOUTH  DAILY BEFORE BREAKFAST 90 tablet 0  . metoprolol tartrate (LOPRESSOR) 25 MG tablet Take 0.5 tablets (12.5 mg total) by mouth 2 (two) times daily. 90 tablet 3  . nitroGLYCERIN (NITROSTAT) 0.4 MG SL tablet Place 1 tablet (0.4 mg total) under the tongue every 5 (five) minutes as needed. 90 tablet 3  . ondansetron (ZOFRAN-ODT) 4 MG disintegrating tablet Take 1 tablet (4 mg total) by mouth every 8 (eight) hours as needed for nausea or vomiting. 20 tablet 0  . pantoprazole (PROTONIX) 40 MG tablet Take 1 tablet (40 mg total) by mouth daily. 90 tablet 1  . pravastatin (PRAVACHOL) 80 MG tablet Take 1 tablet (80 mg total) by mouth daily. 90 tablet 1  . SPIRIVA HANDIHALER 18 MCG inhalation capsule 1  capsule daily.    . Tiotropium Bromide-Olodaterol (STIOLTO RESPIMAT) 2.5-2.5 MCG/ACT AERS Inhale 2 puffs into the lungs daily. 1 Inhaler 0  . torsemide (DEMADEX) 20 MG tablet Take 2 tablets (40 mg total) by mouth as needed. 30 tablet 0  . triamcinolone cream (KENALOG) 0.1 % Apply 1 application topically 2 (two) times daily. 30 g 0  . zolpidem (AMBIEN) 10 MG tablet Take 10 mg by mouth at bedtime as needed for sleep.     No current facility-administered medications for this visit.     Physical Exam  There were no vitals taken for this visit.  Constitutional: overall normal hygiene, normal nutrition, well developed, normal grooming,  normal body habitus. Assistive device:none  Musculoskeletal: gait and station Limp right, muscle tone and strength are normal, no tremors or atrophy is present.  .  Neurological: coordination overall normal.  Deep tendon reflex/nerve stretch intact.  Sensation normal.  Cranial nerves II-XII intact.   Skin:   Normal overall no scars, lesions, ulcers or rashes. No psoriasis.  Psychiatric: Alert and oriented x 3.  Recent memory intact, remote memory unclear.  Normal mood and affect. Well groomed.  Good eye contact.  Cardiovascular: overall no swelling, no varicosities, no edema bilaterally, normal temperatures of the legs and arms, no clubbing, cyanosis and good capillary refill.  Lymphatic: palpation is normal.  Right knee with effusion, crepitus, ROM 0 to 105, medial pain, positive medial [place], limp right, NV intact.  All other systems reviewed and are negative   The patient has been educated about the nature of the problem(s) and counseled on treatment options.  The patient appeared to understand what I have discussed and is in agreement with it.  Encounter Diagnoses  Name Primary?  . Degenerative tear of medial meniscus, right Yes  . Chronic pain of right knee     PLAN Call if any problems.  Precautions discussed.  Continue current medications.    Return to clinic To see Dr. Aline Brochure or Dr. Amedeo Kinsman   Electronically Signed Sanjuana Kava, MD 2/1/20222:33 PM

## 2020-06-07 ENCOUNTER — Ambulatory Visit: Payer: Medicare Other | Admitting: Orthopedic Surgery

## 2020-06-07 ENCOUNTER — Ambulatory Visit: Payer: Medicare Other | Admitting: Cardiology

## 2020-06-07 ENCOUNTER — Encounter: Payer: Self-pay | Admitting: Cardiology

## 2020-06-07 VITALS — BP 130/88 | HR 71 | Ht 64.0 in | Wt 245.0 lb

## 2020-06-07 DIAGNOSIS — I25119 Atherosclerotic heart disease of native coronary artery with unspecified angina pectoris: Secondary | ICD-10-CM

## 2020-06-07 DIAGNOSIS — I5032 Chronic diastolic (congestive) heart failure: Secondary | ICD-10-CM

## 2020-06-07 MED ORDER — TORSEMIDE 20 MG PO TABS: 40.0000 mg | ORAL_TABLET | Freq: Every day | ORAL | 1 refills | Status: DC

## 2020-06-07 NOTE — Progress Notes (Signed)
Cardiology Office Note  Date: 06/07/2020   ID: [name], [name] 17-Feb-1946, MRN [id number]  PCP:  Lindell Spar, MD  Cardiologist:  Rozann Lesches, MD Electrophysiologist:  None   Chief Complaint  Patient presents with  . Cardiac follow-up    History of Present Illness: [name] is a 75 y.o. female last seen in June 2021. She presents for a follow-up visit. States that she feels uncomfortable, has gained weight over a period of weeks to months, feels like her diuretic has had less of an effect. She is prescribed Demadex 20 mg to take as needed. Her weight is up potentially 30 pounds if scales are correct.  She had a video encounter with her PCP, I reviewed the note. She has not presented for follow-up lab work. Creatinine was 1.04 with normal potassium back in June 2021. Her last echocardiogram was also in 2021 at [facility] with LVEF 55 to 60% and reportedly normal RV contraction.  I reviewed her medications today.  Past Medical History:  Diagnosis Date  . Acute ischemic colitis ([place]) 09/11/2005  . Anxiety   . Arthritis   . Atherosclerotic vascular disease    Calcified plaque at the origin of the celiac and SMA  . CHF (congestive heart failure) ([place])   . Coronary atherosclerosis of native coronary artery    Mulitvessel LVEF 50-50%, DES circ 1/11, occluded SVG to OM and SVG to diagonal , LVEF 60%  . Depression   . Diverticulosis of colon   . Essential hypertension   . Hypothyroidism   . Mesenteric ischemia ([place])   . Mitral regurgitation    Mild to moderate  . Mixed hyperlipidemia   . Myocardial infarction ([place]) 1990  . Obesity   . Orthostatic hypotension   . PAD (peripheral artery disease) ([place])   . PVC's (premature ventricular contractions)   . Schizophrenia ([place])   . Sleep apnea    CPAP  . Tubular adenoma   . Type 2 diabetes mellitus ([place])     Past Surgical History:  Procedure Laterality Date  . BACK SURGERY     Lumbar spine surgery  . Carotid  endarectomy Bilateral   . CARPAL TUNNEL RELEASE     Bilateral  . CATARACT EXTRACTION W/PHACO Right 08/24/2013   Procedure: CATARACT EXTRACTION PHACO AND INTRAOCULAR LENS PLACEMENT (IOC);  Surgeon: Tonny Branch, MD;  Location: [facility];  Service: Ophthalmology;  Laterality: Right;  CDE 8.68  . COLONOSCOPY  09/14/2005   Dr. Leonard Schwartz colitis  . COLONOSCOPY WITH ESOPHAGOGASTRODUODENOSCOPY (EGD)  04/14/2012   Dr. Gala Romney- EGD= gastric erosions of doubtful clinical significance per bx- chronic inactive gastritis, benign small bowel mucosa. TCS=colonic diverticulosis, tubular adenoma  . CORONARY ARTERY BYPASS GRAFT     LIMA-LAD; SVG-OM; SVG-DX in 1995 [place]  . NECK SURGERY     Cervical laminectomy  . PARTIAL HYSTERECTOMY    . stents x4      Current Outpatient Medications  Medication Sig Dispense Refill  . ALPRAZolam (XANAX) 0.5 MG tablet Take 1 tablet (0.5 mg total) by mouth 2 (two) times daily as needed for anxiety. 60 tablet 0  . aspirin EC 81 MG tablet Take 81 mg by mouth daily.    Marland Kitchen buPROPion (WELLBUTRIN XL) 150 MG 24 hr tablet TAKE 1 TABLET BY MOUTH IN  THE MORNING AND AT BEDTIME 180 tablet 0  . clopidogrel (PLAVIX) 75 MG tablet Take 75 mg by mouth daily.    Marland Kitchen gabapentin (NEURONTIN) 300 MG capsule Take  1 capsule (300 mg total) by mouth 2 (two) times daily. 180 capsule 0  . HYDROcodone-acetaminophen (NORCO/VICODIN) 5-325 MG tablet One tablet every four hours for pain. 30 tablet 0  . isosorbide mononitrate (IMDUR) 60 MG 24 hr tablet Take 60 mg by mouth daily.     Marland Kitchen ketoconazole (NIZORAL) 2 % shampoo Apply 1 application topically 2 (two) times a week. 120 mL 0  . levothyroxine (SYNTHROID) 50 MCG tablet TAKE 1 TABLET BY MOUTH  DAILY BEFORE BREAKFAST 90 tablet 0  . metoprolol tartrate (LOPRESSOR) 25 MG tablet Take 0.5 tablets (12.5 mg total) by mouth 2 (two) times daily. 90 tablet 3  . nitroGLYCERIN (NITROSTAT) 0.4 MG SL tablet Place 1 tablet (0.4 mg total) under the tongue every 5 (five)  minutes as needed. 90 tablet 3  . ondansetron (ZOFRAN-ODT) 4 MG disintegrating tablet Take 1 tablet (4 mg total) by mouth every 8 (eight) hours as needed for nausea or vomiting. 20 tablet 0  . pantoprazole (PROTONIX) 40 MG tablet Take 1 tablet (40 mg total) by mouth daily. 90 tablet 1  . pravastatin (PRAVACHOL) 80 MG tablet Take 1 tablet (80 mg total) by mouth daily. 90 tablet 1  . SPIRIVA HANDIHALER 18 MCG inhalation capsule 1 capsule daily.    . Tiotropium Bromide-Olodaterol (STIOLTO RESPIMAT) 2.5-2.5 MCG/ACT AERS Inhale 2 puffs into the lungs daily. 1 Inhaler 0  . triamcinolone cream (KENALOG) 0.1 % Apply 1 application topically 2 (two) times daily. 30 g 0  . zolpidem (AMBIEN) 10 MG tablet Take 10 mg by mouth at bedtime as needed for sleep.    Marland Kitchen torsemide (DEMADEX) 20 MG tablet Take 2 tablets (40 mg total) by mouth daily. 180 tablet 1   No current facility-administered medications for this visit.   Allergies:  Patient has no known allergies.   ROS: No chest pain.  Physical Exam: VS:  BP 130/88   Pulse 71   Ht 5\' 4"  (1.626 m)   Wt 245 lb (111.1 kg)   SpO2 95%   BMI 42.05 kg/m , BMI Body mass index is 42.05 kg/m.  Wt Readings from Last 3 Encounters:  06/07/20 245 lb (111.1 kg)  05/13/20 238 lb (108 kg)  05/10/20 216 lb (98 kg)    General: Patient is in no distress. HEENT: Conjunctiva and lids normal, wearing a mask. Neck: Supple, difficult to assess JVP. Lungs: Clear to auscultation, decreased breath sounds at bases. Cardiac: Regular rate and rhythm, no S3, soft systolic murmur. Abdomen: Protuberant, bowel sounds present. Extremities: 2+ lower leg edema.  ECG:  An ECG dated 10/21/2019 was personally reviewed today and demonstrated:  Probable sinus rhythm with low voltage and decreased R wave progression.  Recent Labwork: 10/02/2019: Magnesium 1.6 10/15/2019: ALT 12; AST 21; BUN 15; Creat 1.04; Hemoglobin 9.0; Platelets 255; Potassium 4.5; Sodium 138; TSH 8.55     Component  Value Date/Time   CHOL 142 08/19/2017 1505   TRIG 154 (H) 08/19/2017 1505   HDL 45 08/19/2017 1505   CHOLHDL 3.2 08/19/2017 1505   LDLCALC 66 08/19/2017 1505    Other Studies Reviewed Today:  Echocardiogram 06/15/2019([facility]): SUMMARY The left ventricular size is normal. Mild left ventricular hypertrophy LV ejection fraction = 55-60%. Left ventricular systolic function is normal. No segmental wall motion abnormalities seen in the left ventricle The right ventricle is normal in size and function. The left atrium is mildly dilated. There is no significant valvular stenosis or regurgitation. There was insufficient TR detected to calculate RV systolic  pressure. There is no pericardial effusion. The inferior vena cava was not visualized during the exam. Probably no significant change in comparison with the prior study noted  Carotid Dopplers 10/28/2019: Summary:  Right Carotid: Velocities in the right ICA are consistent with a 40-59%         stenosis. The ECA appears >50% stenosed.   Left Carotid: Velocities in the left ICA are consistent with a 1-39%  stenosis.        The ECA appears >50% stenosed.   Vertebrals: Bilateral vertebral arteries demonstrate antegrade flow.  Atypical        right vertebral artery waveform.  Subclavians: Normal flow hemodynamics were seen in bilateral subclavian        arteries.   Assessment and Plan:  1. Fluid overload, possibly acute on chronic diastolic heart failure based on prior work-up. Plan is to increase Demadex to 40 mg daily for now, follow-up BMET in 7 days. Repeat echocardiogram in comparison to study from last year. We will bring her back to the office for clinical reevaluation of the next 2 to 3 weeks.  2. Multivessel CAD status post CABG as well as DES to the circumflex with known graft disease including an occluded SVG to OM and SVG to diagonal. No progressive angina symptoms on medical therapy.  Aspirin, Plavix, Imdur, Lopressor, and Pravachol.  3. Mild hypothyroidism, last TSH 8.55 and on Synthroid per PCP.  Medication Adjustments/Labs and Tests Ordered: Current medicines are reviewed at length with the patient today.  Concerns regarding medicines are outlined above.   Tests Ordered: Orders Placed This Encounter  Procedures  . Basic metabolic panel  . ECHOCARDIOGRAM COMPLETE    Medication Changes: Meds ordered this encounter  Medications  . torsemide (DEMADEX) 20 MG tablet    Sig: Take 2 tablets (40 mg total) by mouth daily.    Dispense:  180 tablet    Refill:  1    06/07/2020 change in directions    Disposition:  Follow up 2 to 3 weeks in the [place] office.  Signed, Satira Sark, MD, [facility] 06/07/2020 2:21 PM    [facility] [place], [place], [place] [address] Phone: [phone]; Fax: [phone]

## 2020-06-07 NOTE — Patient Instructions (Addendum)
Medication Instructions:   Your physician has recommended you make the following change in your medication:   Change torsemide to 40 mg daily (2 of your 20 mg tablets daily)  Continue other medications the same  Labwork:  Your physician recommends that you return for non-fasting lab work in: one week to check your BMET. Please have this done at [facility] on the same day as your echocardiogram. Give them your lab order  Testing/Procedures: Your physician has requested that you have an echocardiogram. Echocardiography is a painless test that uses sound waves to create images of your heart. It provides your doctor with information about the size and shape of your heart and how well your heart's chambers and valves are working. This procedure takes approximately one hour. There are no restrictions for this procedure.  Follow-Up:  Your physician recommends that you schedule a follow-up appointment in: 2-3 weeks at the [place] office.   Any Other Special Instructions Will Be Listed Below (If Applicable).  If you need a refill on your cardiac medications before your next appointment, please call your pharmacy.

## 2020-06-14 ENCOUNTER — Other Ambulatory Visit (HOSPITAL_COMMUNITY)
Admission: RE | Admit: 2020-06-14 | Discharge: 2020-06-14 | Disposition: A | Discharge status: Home / Self Care | Payer: Medicare Other | Source: Ambulatory Visit | Attending: Cardiology | Admitting: Cardiology

## 2020-06-14 ENCOUNTER — Other Ambulatory Visit: Payer: Self-pay

## 2020-06-14 ENCOUNTER — Telehealth: Payer: Self-pay

## 2020-06-14 ENCOUNTER — Ambulatory Visit (HOSPITAL_COMMUNITY)
Admission: RE | Admit: 2020-06-14 | Discharge: 2020-06-14 | Disposition: A | Discharge status: Home / Self Care | Payer: Medicare Other | Source: Ambulatory Visit | Attending: Cardiology | Admitting: Cardiology

## 2020-06-14 DIAGNOSIS — I5032 Chronic diastolic (congestive) heart failure: Secondary | ICD-10-CM

## 2020-06-14 DIAGNOSIS — I25119 Atherosclerotic heart disease of native coronary artery with unspecified angina pectoris: Secondary | ICD-10-CM | POA: Insufficient documentation

## 2020-06-14 LAB — BASIC METABOLIC PANEL
Anion gap: 10 (ref 5–15)
BUN: 24 mg/dL — ABNORMAL HIGH (ref 8–23)
CO2: 29 mmol/L (ref 22–32)
Calcium: 9 mg/dL (ref 8.9–10.3)
Chloride: 98 mmol/L (ref 98–111)
Creatinine, Ser: 1.34 mg/dL — ABNORMAL HIGH (ref 0.44–1.00)
GFR, Estimated: 41 mL/min — ABNORMAL LOW (ref >60)
Glucose, Bld: 130 mg/dL — ABNORMAL HIGH (ref 70–99)
Potassium: 3.7 mmol/L (ref 3.5–5.1)
Sodium: 137 mmol/L (ref 135–145)

## 2020-06-14 LAB — ECHOCARDIOGRAM COMPLETE
Area-P 1/2: 2.39 cm2
S' Lateral: 3.3 cm

## 2020-06-14 NOTE — Telephone Encounter (Signed)
-----   Message from Satira Sark, MD sent at 06/14/2020  4:27 PM EST ----- Results reviewed.  Potassium low normal at 3.7.  Creatinine has increased from 1.04 up to 1.34 with recent intensification of diuretic.  Please check on patient to see how she is doing in terms of diuresis and weight loss.  Follow-up visit should already be scheduled.

## 2020-06-14 NOTE — Progress Notes (Signed)
*  PRELIMINARY RESULTS* Echocardiogram 2D Echocardiogram has been performed.  [name] 06/14/2020, 4:04 PM

## 2020-06-14 NOTE — Telephone Encounter (Signed)
I spoke with patient and gave her lab results. Her current weight is 243 lbs Down 2 lbs from 06/07/20 office visit.She has follow up on 06/29/20 with L.Dorene Ar, NP

## 2020-06-15 ENCOUNTER — Other Ambulatory Visit: Payer: Self-pay | Admitting: Cardiology

## 2020-06-15 ENCOUNTER — Other Ambulatory Visit: Payer: Self-pay | Admitting: Internal Medicine

## 2020-06-16 ENCOUNTER — Telehealth: Payer: Self-pay | Admitting: *Deleted

## 2020-06-16 NOTE — Telephone Encounter (Signed)
-----   Message from Satira Sark, MD sent at 06/15/2020  8:55 AM EST ----- Results reviewed.  LVEF mildly reduced at 45 to 50%, RV contraction normal.  No clinically significant valvular abnormalities.  Suggest changing from Lopressor to Toprol-XL 25 mg daily.  Keep office follow-up for reassessment as scheduled.

## 2020-06-27 DIAGNOSIS — G4733 Obstructive sleep apnea (adult) (pediatric): Secondary | ICD-10-CM | POA: Diagnosis not present

## 2020-06-27 DIAGNOSIS — R0902 Hypoxemia: Secondary | ICD-10-CM | POA: Diagnosis not present

## 2020-06-28 ENCOUNTER — Other Ambulatory Visit: Payer: Self-pay | Admitting: *Deleted

## 2020-06-28 MED ORDER — ISOSORBIDE MONONITRATE ER 60 MG PO TB24: 60.0000 mg | ORAL_TABLET | Freq: Every day | ORAL | 0 refills | Status: DC

## 2020-06-28 NOTE — Progress Notes (Signed)
Cardiology Office Note   Date:  06/29/2020   ID:  [name], DOB [birth year], MRN [id number]  PCP:  Lindell Spar, MD  Cardiologist:  Dr. Domenic Polite    Chief Complaint  Patient presents with  . Cardiomyopathy      History of Present Illness: [name] is a 75 y.o. female who presents for follow up of volume overload and decrease of EF.  Hx CAD with CABG in 1997, and subsequent rotablation and stenting of the LCX in both the AV groove and 1 st OM 2001 at [facility].  Last cath 2004 with patent LIMA, VG to RCA occluded,  RCA with focal 80% stenosis in mid vessel, hx of both the AV grove LCX and OM with stents tith 30% in stent restenosis.  There is a Y-stent in the AV groove circumflex.  At the ostium of this vessel there is focal 90% restenosis.  There is diffuse 50% restenosis of this stent.  VG to OM occluded. EF was 50% at that time. Hx MI Multivessel CAD status post CABG as well as DES to the circumflex with known graft disease including an occluded SVG to OM and SVG to diagonal. No progressive angina symptoms on medical therapy. Aspirin, Plavix, Imdur, Lopressor, and Pravachol.  Creatinine was 1.04 with normal potassium back in June 2021. Her last echocardiogram was also in 2021 at [facility] with LVEF 55 to 60% and reportedly normal RV contraction.  Saw Dr.McDowell 06/07/20 with fluid overload.  Increased torsemide to 40 daily and BMP with mild increase of Cr  Echo repeated. Recent echo with decrease in EF to 45-50% lopressor changed to toprol XL 25 but she has not yet started.  She only took the torsemide for 3 days and developed severe bladder pain.  And what sounds like urinary retention as well.  Remotely she saw urology and they did not find any issues.  But this has been ongoing issue for 4-5 years.  The torsemide increased.  So she has only had another torsemide once since the first 3 days.    Today she relates occ raid HR and other times she feels she will pass out.   She gets very lightheaded.  With this she is SOB and has chest tightness.  She is worried about torpol as with lopressor she had low BP and dose was decreased to 12.5 BID.  Her daughter is with her today which has been helpful.    Past Medical History:  Diagnosis Date  . Acute ischemic colitis ([place]) 09/11/2005  . Anxiety   . Arthritis   . Atherosclerotic vascular disease    Calcified plaque at the origin of the celiac and SMA  . CHF (congestive heart failure) ([place])   . Coronary atherosclerosis of native coronary artery    Mulitvessel LVEF 50-50%, DES circ 1/11, occluded SVG to OM and SVG to diagonal , LVEF 60%  . Depression   . Diverticulosis of colon   . Essential hypertension   . Hypothyroidism   . Mesenteric ischemia ([place])   . Mitral regurgitation    Mild to moderate  . Mixed hyperlipidemia   . Myocardial infarction ([place]) 1990  . Obesity   . Orthostatic hypotension   . PAD (peripheral artery disease) ([place])   . PVC's (premature ventricular contractions)   . Schizophrenia ([place])   . Sleep apnea    CPAP  . Tubular adenoma   . Type 2 diabetes mellitus ([place])     Past Surgical History:  Procedure Laterality Date  . BACK SURGERY     Lumbar spine surgery  . Carotid endarectomy Bilateral   . CARPAL TUNNEL RELEASE     Bilateral  . CATARACT EXTRACTION W/PHACO Right 08/24/2013   Procedure: CATARACT EXTRACTION PHACO AND INTRAOCULAR LENS PLACEMENT (IOC);  Surgeon: Tonny Branch, MD;  Location: [facility];  Service: Ophthalmology;  Laterality: Right;  CDE 8.68  . COLONOSCOPY  09/14/2005   Dr. Leonard Schwartz colitis  . COLONOSCOPY WITH ESOPHAGOGASTRODUODENOSCOPY (EGD)  04/14/2012   Dr. Gala Romney- EGD= gastric erosions of doubtful clinical significance per bx- chronic inactive gastritis, benign small bowel mucosa. TCS=colonic diverticulosis, tubular adenoma  . CORONARY ARTERY BYPASS GRAFT     LIMA-LAD; SVG-OM; SVG-DX in 1995 [place]  . NECK SURGERY     Cervical laminectomy  . PARTIAL HYSTERECTOMY     . stents x4       Current Outpatient Medications  Medication Sig Dispense Refill  . ALPRAZolam (XANAX) 0.5 MG tablet Take 1 tablet (0.5 mg total) by mouth 2 (two) times daily as needed for anxiety. 60 tablet 0  . aspirin EC 81 MG tablet Take 81 mg by mouth daily.    Marland Kitchen buPROPion (WELLBUTRIN XL) 150 MG 24 hr tablet TAKE 1 TABLET BY MOUTH IN  THE MORNING AND AT BEDTIME 180 tablet 0  . clopidogrel (PLAVIX) 75 MG tablet Take 75 mg by mouth daily.    Marland Kitchen gabapentin (NEURONTIN) 300 MG capsule Take 1 capsule (300 mg total) by mouth 2 (two) times daily. 180 capsule 0  . HYDROcodone-acetaminophen (NORCO/VICODIN) 5-325 MG tablet One tablet every four hours for pain. 30 tablet 0  . isosorbide mononitrate (IMDUR) 60 MG 24 hr tablet Take 1 tablet (60 mg total) by mouth daily. 30 tablet 0  . ketoconazole (NIZORAL) 2 % shampoo Apply 1 application topically 2 (two) times a week. 120 mL 0  . levothyroxine (SYNTHROID) 50 MCG tablet TAKE 1 TABLET BY MOUTH  DAILY BEFORE BREAKFAST 90 tablet 0  . nitroGLYCERIN (NITROSTAT) 0.4 MG SL tablet Place 1 tablet (0.4 mg total) under the tongue every 5 (five) minutes as needed. 90 tablet 3  . ondansetron (ZOFRAN-ODT) 4 MG disintegrating tablet Take 1 tablet (4 mg total) by mouth every 8 (eight) hours as needed for nausea or vomiting. 20 tablet 0  . pantoprazole (PROTONIX) 40 MG tablet TAKE 1 TABLET BY MOUTH  DAILY 90 tablet 3  . pravastatin (PRAVACHOL) 80 MG tablet Take 1 tablet (80 mg total) by mouth daily. 90 tablet 1  . SPIRIVA HANDIHALER 18 MCG inhalation capsule 1 capsule daily.    . Tiotropium Bromide-Olodaterol (STIOLTO RESPIMAT) 2.5-2.5 MCG/ACT AERS Inhale 2 puffs into the lungs daily. 1 Inhaler 0  . torsemide (DEMADEX) 20 MG tablet Take 2 tablets (40 mg total) by mouth daily. 180 tablet 1  . triamcinolone cream (KENALOG) 0.1 % Apply 1 application topically 2 (two) times daily. 30 g 0  . zolpidem (AMBIEN) 10 MG tablet Take 10 mg by mouth at bedtime as needed for  sleep.    . metoprolol succinate (TOPROL XL) 25 MG 24 hr tablet Take 1 tablet (25 mg total) by mouth daily. 90 tablet 2   No current facility-administered medications for this visit.    Allergies:   Patient has no known allergies.    Social History:  The patient  reports that she has quit smoking. Her smoking use included cigarettes. She started smoking about 17 months ago. She has a 25.00 pack-year smoking history. She has never  used smokeless tobacco. She reports current alcohol use. She reports that she does not use drugs.   Family History:  The patient's family history includes Alcohol abuse in her father and mother; Coronary artery disease in an other family member; Crohn's disease (age of onset: 59) in her sister; Hypertension in an other family member; Stroke in her daughter.    ROS:  General:no colds or fevers, + weight increase  Skin:no rashes or ulcers HEENT:no blurred vision, no congestion CV:see HPI PUL:see HPI GI:no diarrhea constipation or melena, no indigestion GU:no hematuria, + dysuria and dribbles with pain MS:hx hip and knee joint pain, no claudication Neuro:+ near syncope, + lightheadedness Endo:+ diabetes, + thyroid disease  Wt Readings from Last 3 Encounters:  06/29/20 246 lb (111.6 kg)  06/07/20 245 lb (111.1 kg)  05/13/20 238 lb (108 kg)     PHYSICAL EXAM: VS:  BP 124/76   Pulse 99   Ht 5\' 4"  (1.626 m)   Wt 246 lb (111.6 kg)   SpO2 96%   BMI 42.23 kg/m  , BMI Body mass index is 42.23 kg/m. General:Pleasant affect, NAD Skin:Warm and dry, brisk capillary refill HEENT:normocephalic, sclera clear, mucus membranes moist Neck:supple, no JVD, no bruits  Heart:S1S2 RRR without murmur, gallup, rub or click Lungs:clear to few rales in bases, without rales, rhonchi, or wheezes WUJ:WJXB, non tender, + BS, do not palpate liver spleen or masses Ext:no lower ext edema, 2+ pedal pulses, 2+ radial pulses Neuro:alert and oriented X 3, MAE, follows commands, +  facial symmetry    EKG:  EKG is NOT ordered today.   Recent Labs: 10/02/2019: Magnesium 1.6 10/15/2019: ALT 12; Hemoglobin 9.0; Platelets 255; TSH 8.55 06/14/2020: BUN 24; Creatinine, Ser 1.34; Potassium 3.7; Sodium 137    Lipid Panel    Component Value Date/Time   CHOL 142 08/19/2017 1505   TRIG 154 (H) 08/19/2017 1505   HDL 45 08/19/2017 1505   CHOLHDL 3.2 08/19/2017 1505   LDLCALC 66 08/19/2017 1505       Other studies Reviewed: Additional studies/ records that were reviewed today include: . 06/14/20 echo IMPRESSIONS    1. Abnormal septal motion distal septal apical mid and basal inferior  wall hypokinesis . Left ventricular ejection fraction, by estimation, is  45 to 50%. The left ventricle has mildly decreased function. The left  ventricle has no regional wall motion  abnormalities. The left ventricular internal cavity size was mildly  dilated. There is moderate left ventricular hypertrophy. Left ventricular  diastolic parameters are consistent with Grade I diastolic dysfunction  (impaired relaxation).  2. Right ventricular systolic function is normal. The right ventricular  size is normal.  3. Left atrial size was mildly dilated.  4. The mitral valve is degenerative. Mild mitral valve regurgitation. No  evidence of mitral stenosis.  5. The aortic valve is tricuspid. Aortic valve regurgitation is not  visualized. Mild aortic valve sclerosis is present, with no evidence of  aortic valve stenosis.  6. The inferior vena cava is normal in size with greater than 50%  respiratory variability, suggesting right atrial pressure of 3 mmHg.   FINDINGS  Left Ventricle: Abnormal septal motion distal septal apical mid and basal  inferior wall hypokinesis. Left ventricular ejection fraction, by  estimation, is 45 to 50%. The left ventricle has mildly decreased  function. The left ventricle has no regional  wall motion abnormalities. The left ventricular internal cavity  size was  mildly dilated. There is moderate left ventricular hypertrophy. Left  ventricular diastolic parameters are consistent with Grade I diastolic  dysfunction (impaired relaxation).   Right Ventricle: The right ventricular size is normal. No increase in  right ventricular wall thickness. Right ventricular systolic function is  normal.   Left Atrium: Left atrial size was mildly dilated.   Right Atrium: Right atrial size was normal in size.   Pericardium: There is no evidence of pericardial effusion.   Mitral Valve: The mitral valve is degenerative in appearance. There is  mild thickening of the mitral valve leaflet(s). There is mild  calcification of the mitral valve leaflet(s). Mild mitral annular  calcification. Mild mitral valve regurgitation. No  evidence of mitral valve stenosis.   Tricuspid Valve: The tricuspid valve is normal in structure. Tricuspid  valve regurgitation is mild . No evidence of tricuspid stenosis.   Aortic Valve: The aortic valve is tricuspid. Aortic valve regurgitation is  not visualized. Mild aortic valve sclerosis is present, with no evidence  of aortic valve stenosis.   Pulmonic Valve: The pulmonic valve was normal in structure. Pulmonic valve  regurgitation is not visualized. No evidence of pulmonic stenosis.   Aorta: The aortic root is normal in size and structure.   Venous: The inferior vena cava is normal in size with greater than 50%  respiratory variability, suggesting right atrial pressure of 3 mmHg.   IAS/Shunts: No atrial level shunt detected by color flow Doppler.      ASSESSMENT AND PLAN:  1.  Cardiomyopathy - with EF 45-50% changing lopressor to toprol XL 25 daily. Then will follow up in 3-4 weeks.hx of hypotension with higher doses.   Medication management she will take torsemide as tolerated. Office visit and cath reviewed. 2.  CAD with CABG 1997 and last cath 2004 with graft dysfunction.  LIMA was patent and had nuc study with  only mild ischemia so treated medically.   Does have occ chest tightness when feels faint.  Happens frequently. Will have her wear a monitor for 2 weeks to eval.  She does report rapid HR at times.  contnue BB, ASA plavix imdur and statin 3.  Near syncope though not orthostatic today.  BP lying 124/64, P 59, sitting 137/77 P 63 and standing 125/71 P 68.  And with tachycardia at times.  zio patch live to eval. Check CBC 4. SOB with combined systolic and diastolic HF has been unable to take torsemide due to bladder issues. 5.  Urinary retention and burning. Will check u/a today and will refer to urology once ua is back. 6.  DM per PCP 7. Hypothyroid per PCP labs reviewed in chart and with pt and daughter 8. HLD per PCP  9. CKD 3 a, recheck labs     Current medicines are reviewed with the patient today.  The patient Has no concerns regarding medicines.  The following changes have been made:  See above Labs/ tests ordered today include:see above  Disposition:   FU:  see above  Signed, Cecilie Kicks, NP  06/29/2020 4:27 PM    [facility], [place], [place], [place] Phone: [phone]; Fax: [phone]

## 2020-06-29 ENCOUNTER — Other Ambulatory Visit: Payer: Self-pay | Admitting: Cardiology

## 2020-06-29 ENCOUNTER — Other Ambulatory Visit: Payer: Self-pay

## 2020-06-29 ENCOUNTER — Ambulatory Visit: Payer: Medicare Other | Admitting: Cardiology

## 2020-06-29 ENCOUNTER — Encounter: Payer: Self-pay | Admitting: Cardiology

## 2020-06-29 VITALS — BP 124/76 | HR 99 | Ht 64.0 in | Wt 246.0 lb

## 2020-06-29 DIAGNOSIS — Z79899 Other long term (current) drug therapy: Secondary | ICD-10-CM

## 2020-06-29 DIAGNOSIS — R000 Tachycardia, unspecified: Secondary | ICD-10-CM

## 2020-06-29 DIAGNOSIS — E782 Mixed hyperlipidemia: Secondary | ICD-10-CM | POA: Diagnosis not present

## 2020-06-29 DIAGNOSIS — E118 Type 2 diabetes mellitus with unspecified complications: Secondary | ICD-10-CM | POA: Diagnosis not present

## 2020-06-29 DIAGNOSIS — R55 Syncope and collapse: Secondary | ICD-10-CM

## 2020-06-29 DIAGNOSIS — R339 Retention of urine, unspecified: Secondary | ICD-10-CM | POA: Diagnosis not present

## 2020-06-29 DIAGNOSIS — I5042 Chronic combined systolic (congestive) and diastolic (congestive) heart failure: Secondary | ICD-10-CM | POA: Diagnosis not present

## 2020-06-29 DIAGNOSIS — N1831 Chronic kidney disease, stage 3a: Secondary | ICD-10-CM | POA: Diagnosis not present

## 2020-06-29 DIAGNOSIS — I25119 Atherosclerotic heart disease of native coronary artery with unspecified angina pectoris: Secondary | ICD-10-CM | POA: Diagnosis not present

## 2020-06-29 MED ORDER — METOPROLOL SUCCINATE ER 25 MG PO TB24: 25.0000 mg | ORAL_TABLET | Freq: Every day | ORAL | 2 refills | Status: DC

## 2020-06-29 NOTE — Patient Instructions (Signed)
Medication Instructions:  Your physician has recommended you make the following change in your medication:   Stop Taking Lopressor  Start Taking Toprol XL 25 mg Daily   *If you need a refill on your cardiac medications before your next appointment, please call your pharmacy*   Lab Work: Your physician recommends that you return for lab work in: Today   If you have labs (blood work) drawn today and your tests are completely normal, you will receive your results only by: Marland Kitchen MyChart Message (if you have MyChart) OR . A paper copy in the mail If you have any lab test that is abnormal or we need to change your treatment, we will call you to review the results.   Testing/Procedures: NONE   Follow-Up: At [facility], you and your health needs are our priority.  As part of our continuing mission to provide you with exceptional heart care, we have created designated Provider Care Teams.  These Care Teams include your primary Cardiologist (physician) and Advanced Practice Providers (APPs -  Physician Assistants and Nurse Practitioners) who all work together to provide you with the care you need, when you need it.  We recommend signing up for the patient portal called "MyChart".  Sign up information is provided on this After Visit Summary.  MyChart is used to connect with patients for Virtual Visits (Telemedicine).  Patients are able to view lab/test results, encounter notes, upcoming appointments, etc.  Non-urgent messages can be sent to your provider as well.   To learn more about what you can do with MyChart, go to [email or web address].    Your next appointment:   3 week(s)  The format for your next appointment:   In Person  Provider:   You may see Rozann Lesches, MD or one of the following Advanced Practice Providers on your designated Care Team:    Bernerd Pho, PA-C   Ermalinda Barrios, PA-C     Other Instructions Thank you for choosing [facility]!

## 2020-06-29 NOTE — Telephone Encounter (Signed)
Patient's granddaughter informed and verbalized understanding of plan. Copy sent to PCP

## 2020-06-30 ENCOUNTER — Ambulatory Visit (INDEPENDENT_AMBULATORY_CARE_PROVIDER_SITE_OTHER): Payer: Medicare Other

## 2020-06-30 DIAGNOSIS — R000 Tachycardia, unspecified: Secondary | ICD-10-CM

## 2020-06-30 DIAGNOSIS — R339 Retention of urine, unspecified: Secondary | ICD-10-CM | POA: Diagnosis not present

## 2020-06-30 DIAGNOSIS — Z79899 Other long term (current) drug therapy: Secondary | ICD-10-CM | POA: Diagnosis not present

## 2020-06-30 DIAGNOSIS — R55 Syncope and collapse: Secondary | ICD-10-CM

## 2020-07-01 LAB — CBC WITH DIFFERENTIAL/PLATELET
Basophils Absolute: 0 10*3/uL (ref 0.0–0.2)
Basos: 0 %
EOS (ABSOLUTE): 0.1 10*3/uL (ref 0.0–0.4)
Eos: 2 %
Hematocrit: 37.2 % (ref 34.0–46.6)
Hemoglobin: 12.2 g/dL (ref 11.1–15.9)
Immature Grans (Abs): 0 10*3/uL (ref 0.0–0.1)
Immature Granulocytes: 0 %
Lymphocytes Absolute: 1.4 10*3/uL (ref 0.7–3.1)
Lymphs: 26 %
MCH: 30.8 pg (ref 26.6–33.0)
MCHC: 32.8 g/dL (ref 31.5–35.7)
MCV: 94 fL (ref 79–97)
Monocytes Absolute: 0.7 10*3/uL (ref 0.1–0.9)
Monocytes: 13 %
Neutrophils Absolute: 3 10*3/uL (ref 1.4–7.0)
Neutrophils: 59 %
Platelets: 172 10*3/uL (ref 150–450)
RBC: 3.96 x10E6/uL (ref 3.77–5.28)
RDW: 13 % (ref 11.7–15.4)
WBC: 5.2 10*3/uL (ref 3.4–10.8)

## 2020-07-01 LAB — URINALYSIS
Bilirubin, UA: NEGATIVE
Glucose, UA: NEGATIVE
Ketones, UA: NEGATIVE
Leukocytes,UA: NEGATIVE
Nitrite, UA: NEGATIVE
Protein,UA: NEGATIVE
RBC, UA: NEGATIVE
Specific Gravity, UA: 1.017 (ref 1.005–1.030)
Urobilinogen, Ur: 0.2 mg/dL (ref 0.2–1.0)
pH, UA: 6.5 (ref 5.0–7.5)

## 2020-07-01 LAB — BASIC METABOLIC PANEL
BUN/Creatinine Ratio: 15 (ref 12–28)
BUN: 18 mg/dL (ref 8–27)
CO2: 23 mmol/L (ref 20–29)
Calcium: 9.4 mg/dL (ref 8.7–10.3)
Chloride: 103 mmol/L (ref 96–106)
Creatinine, Ser: 1.24 mg/dL — ABNORMAL HIGH (ref 0.57–1.00)
Glucose: 128 mg/dL — ABNORMAL HIGH (ref 65–99)
Potassium: 4.9 mmol/L (ref 3.5–5.2)
Sodium: 143 mmol/L (ref 134–144)
eGFR: 45 mL/min/{1.73_m2} — ABNORMAL LOW (ref >59)

## 2020-07-02 DIAGNOSIS — R55 Syncope and collapse: Secondary | ICD-10-CM | POA: Diagnosis not present

## 2020-07-02 DIAGNOSIS — R000 Tachycardia, unspecified: Secondary | ICD-10-CM | POA: Diagnosis not present

## 2020-07-20 ENCOUNTER — Telehealth: Payer: Self-pay

## 2020-07-20 DIAGNOSIS — K551 Chronic vascular disorders of intestine: Secondary | ICD-10-CM | POA: Diagnosis not present

## 2020-07-20 DIAGNOSIS — Z95828 Presence of other vascular implants and grafts: Secondary | ICD-10-CM | POA: Diagnosis not present

## 2020-07-20 DIAGNOSIS — I739 Peripheral vascular disease, unspecified: Secondary | ICD-10-CM | POA: Diagnosis not present

## 2020-07-20 DIAGNOSIS — K559 Vascular disorder of intestine, unspecified: Secondary | ICD-10-CM | POA: Diagnosis not present

## 2020-07-20 NOTE — Telephone Encounter (Signed)
-----   Message from Erma Heritage, [place] sent at 07/20/2020 12:50 PM EDT ----- Covering for Mickel Baas - Please let the patient know that her event monitor showed predominantly normal sinus rhythm and she did have frequent extra beats including premature atrial contractions and premature ventricular contractions which were less than 1% of her overall total beats. She did have one episode of nonsustained ventricular tachycardia lasting 11 beats but no sustained abnormal rhythms or pauses. By review of Laura's note, she was recently started on Toprol-XL 25mg  daily. Would see if this can be further titrated at her visit next week if HR and BP allow at that time.

## 2020-07-20 NOTE — Telephone Encounter (Signed)
Patient's granddaughter contacted our office for patient's results. She voiced understanding and had no questions or concerns at this time.

## 2020-07-21 ENCOUNTER — Telehealth: Payer: Self-pay

## 2020-07-21 ENCOUNTER — Ambulatory Visit: Payer: Medicare Other | Admitting: Family Medicine

## 2020-07-21 ENCOUNTER — Other Ambulatory Visit: Payer: Self-pay | Admitting: *Deleted

## 2020-07-21 MED ORDER — PANTOPRAZOLE SODIUM 40 MG PO TBEC: 40.0000 mg | DELAYED_RELEASE_TABLET | Freq: Every day | ORAL | 3 refills | Status: DC

## 2020-07-21 NOTE — Telephone Encounter (Signed)
Pt medication sent to pharmacy  

## 2020-07-21 NOTE — Telephone Encounter (Signed)
Patient needing refills on [facility]ix [facility] she is requesting this be a 90 day supply ph# [phone]

## 2020-07-24 ENCOUNTER — Other Ambulatory Visit: Payer: Self-pay | Admitting: Internal Medicine

## 2020-07-24 DIAGNOSIS — E039 Hypothyroidism, unspecified: Secondary | ICD-10-CM

## 2020-07-28 NOTE — Progress Notes (Signed)
Cardiology Office Note  Date: 07/29/2020   ID: [name], DOB March 27, 1946, MRN [id number]  PCP:  Lindell Spar, MD  Cardiologist:  Rozann Lesches, MD Electrophysiologist:  None   Chief Complaint: 3 week follow up  History of Present Illness: [name] is a 75 y.o. female with a history of chronic combined systolic and diastolic heart failure,  HTN, hypothyroidism, MR. CABG with DES to LCX, graft disease with occluded SVG to OM and SVG to Diag.  Previously saw Dr Domenic Polite 06/07/2020 with evidence of fluid overload. Demadex increased to 40 mg daily and to repeat echocardiogram with follow up BMET in one week. Plan to follow up in 2-3 weeks. Was continuing ASA, Plavix, Imdur Lopressor, and Pravachol. TSH was elevated 8.55 and on Synthroid.  Saw Cecilie Kicks NP on 06/29/2020. BMET with mild increase in Creat. Repeat echo demonstrated decreased EF 45-50%.  Lopressor was changed to Toprol XL 25 but she had not started the medication at that time. She had taken only 3 days of Torsemide and complained of severe bladder pain. She complained of occasional rapid HR and other times felt near syncopal sensation. Occasional chest tightness with near syncopal / lightheaded sensation. She was worried about low BP on Toprol XL due to having low BP on Lopressor previously. She planned to take torsemide as tolerated. Zio monitor was ordered. Orthostatic VS were negative. U/A ordered for urinary retention and burning. Plan was to refer to urology. CBC , BMET.  She is here for follow-up today.  She has not been taking her torsemide as directed due to complaints of bladder pain secondary to possible urinary retention.  She has seen Dr. Alyson Ingles in the past and has been treated for urinary retention per her statement.  States she was on medication which helped her with this in the past but currently is not taking it.  She had a recent ZIO monitor for complaints of rapid heart rate and feeling dizzy and  near syncopal.  ZIO monitor results below showed no significant sustained arrhythmias.  She states sometimes when she is standing erect she feels dizzy and looks downward when this occurs.  States this seems to help.  She denies any syncopal episodes or feeling near syncopal, only dizziness.  She has a history of TIAs and is wondering if this may be affecting her balance or dizziness. CBC was normal.  Basic metabolic panel showed glucose of 128, creatinine 1.24, GFR 45.  UA was negative for infection.   Past Medical History:  Diagnosis Date  . Acute ischemic colitis ([place]) 09/11/2005  . Anxiety   . Arthritis   . Atherosclerotic vascular disease    Calcified plaque at the origin of the celiac and SMA  . CHF (congestive heart failure) ([place])   . Coronary atherosclerosis of native coronary artery    Mulitvessel LVEF 50-50%, DES circ 1/11, occluded SVG to OM and SVG to diagonal , LVEF 60%  . Depression   . Diverticulosis of colon   . Essential hypertension   . Hypothyroidism   . Mesenteric ischemia ([place])   . Mitral regurgitation    Mild to moderate  . Mixed hyperlipidemia   . Myocardial infarction ([place]) 1990  . Obesity   . Orthostatic hypotension   . PAD (peripheral artery disease) ([place])   . PVC's (premature ventricular contractions)   . Schizophrenia ([place])   . Sleep apnea    CPAP  . Tubular adenoma   . Type 2 diabetes  [facility])     Past Surgical History:  Procedure Laterality Date  . BACK SURGERY     Lumbar spine surgery  . Carotid endarectomy Bilateral   . CARPAL TUNNEL RELEASE     Bilateral  . CATARACT EXTRACTION W/PHACO Right 08/24/2013   Procedure: CATARACT EXTRACTION PHACO AND INTRAOCULAR LENS PLACEMENT (IOC);  Surgeon: Tonny Branch, MD;  Location: [facility];  Service: Ophthalmology;  Laterality: Right;  CDE 8.68  . COLONOSCOPY  09/14/2005   Dr. Leonard Schwartz colitis  . COLONOSCOPY WITH ESOPHAGOGASTRODUODENOSCOPY (EGD)  04/14/2012   Dr. Gala Romney- EGD= gastric erosions of  doubtful clinical significance per bx- chronic inactive gastritis, benign small bowel mucosa. TCS=colonic diverticulosis, tubular adenoma  . CORONARY ARTERY BYPASS GRAFT     LIMA-LAD; SVG-OM; SVG-DX in 1995 [place]  . NECK SURGERY     Cervical laminectomy  . PARTIAL HYSTERECTOMY    . stents x4      Current Outpatient Medications  Medication Sig Dispense Refill  . ALPRAZolam (XANAX) 0.5 MG tablet Take 1 tablet (0.5 mg total) by mouth 2 (two) times daily as needed for anxiety. 60 tablet 0  . aspirin EC 81 MG tablet Take 81 mg by mouth daily.    Marland Kitchen buPROPion (WELLBUTRIN XL) 150 MG 24 hr tablet TAKE 1 TABLET BY MOUTH IN  THE MORNING AND AT BEDTIME 180 tablet 0  . clopidogrel (PLAVIX) 75 MG tablet Take 75 mg by mouth daily.    Marland Kitchen gabapentin (NEURONTIN) 300 MG capsule Take 1 capsule (300 mg total) by mouth 2 (two) times daily. 180 capsule 0  . HYDROcodone-acetaminophen (NORCO/VICODIN) 5-325 MG tablet One tablet every four hours for pain. 30 tablet 0  . isosorbide mononitrate (IMDUR) 60 MG 24 hr tablet Take 1 tablet (60 mg total) by mouth daily. 30 tablet 0  . ketoconazole (NIZORAL) 2 % shampoo Apply 1 application topically 2 (two) times a week. 120 mL 0  . levothyroxine (SYNTHROID) 50 MCG tablet TAKE 1 TABLET BY MOUTH  DAILY BEFORE BREAKFAST 30 tablet 0  . metoprolol succinate (TOPROL XL) 25 MG 24 hr tablet Take 1 tablet (25 mg total) by mouth daily. 90 tablet 2  . nitroGLYCERIN (NITROSTAT) 0.4 MG SL tablet Place 1 tablet (0.4 mg total) under the tongue every 5 (five) minutes as needed. 90 tablet 3  . ondansetron (ZOFRAN-ODT) 4 MG disintegrating tablet Take 1 tablet (4 mg total) by mouth every 8 (eight) hours as needed for nausea or vomiting. 20 tablet 0  . pantoprazole (PROTONIX) 40 MG tablet Take 1 tablet (40 mg total) by mouth daily. 90 tablet 3  . pravastatin (PRAVACHOL) 80 MG tablet Take 1 tablet (80 mg total) by mouth daily. 90 tablet 1  . SPIRIVA HANDIHALER 18 MCG inhalation capsule 1 capsule  daily.    . Tiotropium Bromide-Olodaterol (STIOLTO RESPIMAT) 2.5-2.5 MCG/ACT AERS Inhale 2 puffs into the lungs daily. 1 Inhaler 0  . torsemide (DEMADEX) 20 MG tablet Take 2 tablets (40 mg total) by mouth daily. 180 tablet 1  . triamcinolone cream (KENALOG) 0.1 % Apply 1 application topically 2 (two) times daily. 30 g 0  . zolpidem (AMBIEN) 10 MG tablet Take 10 mg by mouth at bedtime as needed for sleep.     No current facility-administered medications for this visit.   Allergies:  Patient has no known allergies.   Social History: The patient  reports that she has quit smoking. Her smoking use included cigarettes. She started smoking about 18 months ago. She has a  25.00 pack-year smoking history. She has never used smokeless tobacco. She reports current alcohol use. She reports that she does not use drugs.   Family History: The patient's family history includes Alcohol abuse in her father and mother; Coronary artery disease in an other family member; Crohn's disease (age of onset: 3) in her sister; Hypertension in an other family member; Stroke in her daughter.   ROS:  Please see the history of present illness. Otherwise, complete review of systems is positive for none.  All other systems are reviewed and negative.   Physical Exam: VS:  BP 108/78   Pulse (!) 58   Ht 5\' 4"  (1.626 m)   Wt 248 lb (112.5 kg)   SpO2 94%   BMI 42.57 kg/m , BMI Body mass index is 42.57 kg/m.  Wt Readings from Last 3 Encounters:  07/29/20 248 lb (112.5 kg)  06/29/20 246 lb (111.6 kg)  06/07/20 245 lb (111.1 kg)    General: Patient appears comfortable at rest. Neck: Supple, no elevated JVP or carotid bruits, no thyromegaly. Lungs: Clear to auscultation, nonlabored breathing at rest. Cardiac: Regular rate and rhythm, no S3 or significant systolic murmur, no pericardial rub. Extremities: No pitting edema, distal pulses 2+. Skin: Warm and dry. Musculoskeletal: No kyphosis. Neuropsychiatric: Alert and  oriented x3, affect grossly appropriate.  ECG:    Recent Labwork: 10/02/2019: Magnesium 1.6 10/15/2019: ALT 12; AST 21; TSH 8.55 06/30/2020: BUN 18; Creatinine, Ser 1.24; Hemoglobin 12.2; Platelets 172; Potassium 4.9; Sodium 143     Component Value Date/Time   CHOL 142 08/19/2017 1505   TRIG 154 (H) 08/19/2017 1505   HDL 45 08/19/2017 1505   CHOLHDL 3.2 08/19/2017 1505   LDLCALC 66 08/19/2017 1505    Other Studies Reviewed Today:  Cardiac Monitor 07/20/2020 Preliminary Findings Patient had a min HR of 48 bpm, max HR of 156 bpm, and avg HR of 61 bpm. Predominant underlying rhythm was Sinus Rhythm.First Degree AV Block was present. 1 run of Ventricular Tachycardia occurred lasting 11 beats with a max rate of 128 bpm (avg 114 bpm). 13 Supraventricular Tachycardia runs occurred, the run with the fastest interval lasting 5 beats with a max rate of 156 bpm, the longest lasting 8 beats with an avg rate of 106 bpm. Isolated SVEs were rare (<1.0%), SVE Couplets were rare (<1.0%), and SVE Triplets were rare (<1.0%). Isolated VEs were rare (<1.0%, 8575), VE Couplets were rare (<1.0%, 99), and VE Triplets were rare (<1.0%, 6). Ventricular Trigeminy was present.   06/14/20 echo IMPRESSIONS  1. Abnormal septal motion distal septal apical mid and basal inferior  wall hypokinesis . Left ventricular ejection fraction, by estimation, is  45 to 50%. The left ventricle has mildly decreased function. The left  ventricle has no regional wall motion  abnormalities. The left ventricular internal cavity size was mildly  dilated. There is moderate left ventricular hypertrophy. Left ventricular  diastolic parameters are consistent with Grade I diastolic dysfunction  (impaired relaxation).  2. Right ventricular systolic function is normal. The right ventricular  size is normal.  3. Left atrial size was mildly dilated.  4. The mitral valve is degenerative. Mild mitral valve regurgitation. No  evidence  of mitral stenosis.  5. The aortic valve is tricuspid. Aortic valve regurgitation is not  visualized. Mild aortic valve sclerosis is present, with no evidence of  aortic valve stenosis.  6. The inferior vena cava is normal in size with greater than 50%  respiratory variability, suggesting right atrial  pressure of 3 mmHg.   FINDINGS  Left Ventricle: Abnormal septal motion distal septal apical mid and basal  inferior wall hypokinesis. Left ventricular ejection fraction, by  estimation, is 45 to 50%. The left ventricle has mildly decreased  function. The left ventricle has no regional  wall motion abnormalities. The left ventricular internal cavity size was  mildly dilated. There is moderate left ventricular hypertrophy. Left  ventricular diastolic parameters are consistent with Grade I diastolic  dysfunction (impaired relaxation).   Right Ventricle: The right ventricular size is normal. No increase in  right ventricular wall thickness. Right ventricular systolic function is  normal.   Left Atrium: Left atrial size was mildly dilated.   Right Atrium: Right atrial size was normal in size.   Pericardium: There is no evidence of pericardial effusion.   Mitral Valve: The mitral valve is degenerative in appearance. There is  mild thickening of the mitral valve leaflet(s). There is mild  calcification of the mitral valve leaflet(s). Mild mitral annular  calcification. Mild mitral valve regurgitation. No  evidence of mitral valve stenosis.   Tricuspid Valve: The tricuspid valve is normal in structure. Tricuspid  valve regurgitation is mild . No evidence of tricuspid stenosis.   Aortic Valve: The aortic valve is tricuspid. Aortic valve regurgitation is  not visualized. Mild aortic valve sclerosis is present, with no evidence  of aortic valve stenosis.   Pulmonic Valve: The pulmonic valve was normal in structure. Pulmonic valve  regurgitation is not visualized. No evidence of pulmonic  stenosis.   Aorta: The aortic root is normal in size and structure.   Venous: The inferior vena cava is normal in size with greater than 50%  respiratory variability, suggesting right atrial pressure of 3 mmHg.   IAS/Shunts: No atrial level shunt detected by color flow Doppler.    Echocardiogram 06/15/2019([facility]): SUMMARY The left ventricular size is normal. Mild left ventricular hypertrophy LV ejection fraction = 55-60%. Left ventricular systolic function is normal. No segmental wall motion abnormalities seen in the left ventricle The right ventricle is normal in size and function. The left atrium is mildly dilated. There is no significant valvular stenosis or regurgitation. There was insufficient TR detected to calculate RV systolic pressure. There is no pericardial effusion. The inferior vena cava was not visualized during the exam. Probably no significant change in comparison with the prior study noted  Carotid Dopplers 10/28/2019: Summary:  Right Carotid: Velocities in the right ICA are consistent with a 40-59%         stenosis. The ECA appears >50% stenosed.   Left Carotid: Velocities in the left ICA are consistent with a 1-39%  stenosis.        The ECA appears >50% stenosed.   Vertebrals: Bilateral vertebral arteries demonstrate antegrade flow.  Atypical        right vertebral artery waveform.  Subclavians: Normal flow hemodynamics were seen in bilateral subclavian        arteries.   Assessment and Plan:  1. Chronic combined systolic (congestive) and diastolic (congestive) heart failure ([place])   2. CAD in native artery   3. Near syncope    1. Chronic combined systolic (congestive) and diastolic (congestive) heart failure ([facility]) Recent echocardiogram 06/04/2020 demonstrated abnormal septal motion distal septal apical mid and basal inferior wall hypokinesis.  EF by estimation 45 to 50%.  No WMA's.  LV internal cavity size mildly  dilated.  Moderate LVH.  G1 DD.  Mild MR.  Continue Toprol-XL 25 mg daily.  Continue torsemide 40 mg daily.  Continue Imdur 60 mg daily.  Continues with shortness of breath on exertion.  2. CAD in native artery Denies anginal symptoms.  Continue aspirin 81 mg daily.  Plavix 75 mg daily, Imdur 60 mg daily, Toprol-XL 25 mg daily, nitroglycerin sublingual as needed.  Pravastatin 80 mg daily.  3. Near syncope Recent complaints of near syncope and was placed on ZIO monitor.  The results are above.  She had 1 nonsustained ventricular tachycardia episode.  She had 13 SVT runs.  The run with the fastest interval was 5 beats with a maximum rate of 156.  The longest lasting 8 beats with an average of 106.  Predominant underlying rhythm was sinus with first-degree AV block.  Her daughter states she has had several TIAs and is wondering if this may be contributing.    Medication Adjustments/Labs and Tests Ordered: Current medicines are reviewed at length with the patient today.  Concerns regarding medicines are outlined above.   Disposition: Follow-up with Dr. Domenic Polite or APP 1 month  Signed, Levell July, NP 07/29/2020 5:35 PM    [facility] at [address], [place], [place] [address] Phone: [phone]; Fax: [phone]

## 2020-07-29 ENCOUNTER — Ambulatory Visit: Payer: Medicare Other | Admitting: Family Medicine

## 2020-07-29 ENCOUNTER — Encounter: Payer: Self-pay | Admitting: Family Medicine

## 2020-07-29 VITALS — BP 108/78 | HR 58 | Ht 64.0 in | Wt 248.0 lb

## 2020-07-29 DIAGNOSIS — I2510 Atherosclerotic heart disease of native coronary artery without angina pectoris: Secondary | ICD-10-CM

## 2020-07-29 DIAGNOSIS — R0602 Shortness of breath: Secondary | ICD-10-CM

## 2020-07-29 DIAGNOSIS — I5042 Chronic combined systolic (congestive) and diastolic (congestive) heart failure: Secondary | ICD-10-CM | POA: Diagnosis not present

## 2020-07-29 DIAGNOSIS — R55 Syncope and collapse: Secondary | ICD-10-CM

## 2020-07-29 NOTE — Patient Instructions (Signed)
Your physician recommends that you schedule a follow-up appointment in: [place], NP  Your physician recommends that you continue on your current medications as directed. Please refer to the Current Medication list given to you today.  Thank you for choosing [facility]!!

## 2020-08-03 ENCOUNTER — Ambulatory Visit (INDEPENDENT_AMBULATORY_CARE_PROVIDER_SITE_OTHER): Payer: Medicare Other | Admitting: Internal Medicine

## 2020-08-03 ENCOUNTER — Other Ambulatory Visit: Payer: Self-pay

## 2020-08-03 ENCOUNTER — Telehealth: Payer: Self-pay

## 2020-08-03 ENCOUNTER — Encounter: Payer: Self-pay | Admitting: Internal Medicine

## 2020-08-03 VITALS — BP 130/69 | HR 66 | Temp 98.2°F | Resp 18 | Ht 64.0 in | Wt 241.4 lb

## 2020-08-03 DIAGNOSIS — N3281 Overactive bladder: Secondary | ICD-10-CM | POA: Diagnosis not present

## 2020-08-03 DIAGNOSIS — E1169 Type 2 diabetes mellitus with other specified complication: Secondary | ICD-10-CM

## 2020-08-03 DIAGNOSIS — I10 Essential (primary) hypertension: Secondary | ICD-10-CM

## 2020-08-03 DIAGNOSIS — J449 Chronic obstructive pulmonary disease, unspecified: Secondary | ICD-10-CM

## 2020-08-03 DIAGNOSIS — Z1159 Encounter for screening for other viral diseases: Secondary | ICD-10-CM | POA: Diagnosis not present

## 2020-08-03 DIAGNOSIS — E039 Hypothyroidism, unspecified: Secondary | ICD-10-CM | POA: Diagnosis not present

## 2020-08-03 DIAGNOSIS — E559 Vitamin D deficiency, unspecified: Secondary | ICD-10-CM

## 2020-08-03 MED ORDER — MIRABEGRON ER 25 MG PO TB24: 25.0000 mg | ORAL_TABLET | Freq: Every day | ORAL | 0 refills | Status: DC

## 2020-08-03 NOTE — Assessment & Plan Note (Signed)
Lab Results  Component Value Date   TSH 8.55 (H) 10/15/2019   On Levothyroxine 50 mcg recently Check TSH and free T4, adjust dose accordingly

## 2020-08-03 NOTE — Patient Instructions (Signed)
Please start taking Myrbetriq as prescribed for overactive bladder.  Please continue to take other medications as prescribed.  Please contact us with the names of your inhalers.  TSH, HbA1C and Vitamin D today.

## 2020-08-03 NOTE — Progress Notes (Signed)
Established Patient Office Visit  Subjective:  Patient ID: [name], female    DOB: 1945-12-14  Age: 75 y.o. MRN: [id number]  CC:  Chief Complaint  Patient presents with  . Follow-up    6 week follow up just feels tired all the time     HPI [name] is a 75 y.o. female with past medical history of hypertension, hyperlipidemia, mesenteric ischemia status post right hemicolectomy, coronary artery disease, hypothyroidism, type 2 diabetes mellitus, depression with anxiety, obesity, arthritis, chronic back painwho presents for follow up of her chronic medical conditions.  HTN: BP is well-controlled. Takes medications regularly. Patient denies headache, dizziness, chest pain, dyspnea or palpitations.  COPD: Well-controlled currently. She uses 2 inhalers at home, but unsure of names. Chart review suggests Spiriva and Stiolto along with PRN Albuterol.  CHF: Has been taking Torsemide intermittently. Has been worries due to her overactive bladder. She was on Myrbetriq in the past.  Hypothyroidism: C/o chronic fatigue. Has been taking Levothyroxine regularly.  Past Medical History:  Diagnosis Date  . Acute ischemic colitis ([place]) 09/11/2005  . Anxiety   . Arthritis   . Atherosclerotic vascular disease    Calcified plaque at the origin of the celiac and SMA  . CHF (congestive heart failure) ([place])   . Coronary atherosclerosis of native coronary artery    Mulitvessel LVEF 50-50%, DES circ 1/11, occluded SVG to OM and SVG to diagonal , LVEF 60%  . Depression   . Diverticulosis of colon   . Essential hypertension   . Hypothyroidism   . Mesenteric ischemia ([place])   . Mitral regurgitation    Mild to moderate  . Mixed hyperlipidemia   . Myocardial infarction ([place]) 1990  . Obesity   . Orthostatic hypotension   . PAD (peripheral artery disease) ([place])   . PVC's (premature ventricular contractions)   . Schizophrenia ([place])   . Sleep apnea    CPAP  . Tubular adenoma   .  Type 2 diabetes mellitus ([place])     Past Surgical History:  Procedure Laterality Date  . BACK SURGERY     Lumbar spine surgery  . Carotid endarectomy Bilateral   . CARPAL TUNNEL RELEASE     Bilateral  . CATARACT EXTRACTION W/PHACO Right 08/24/2013   Procedure: CATARACT EXTRACTION PHACO AND INTRAOCULAR LENS PLACEMENT (IOC);  Surgeon: Tonny Branch, MD;  Location: [facility];  Service: Ophthalmology;  Laterality: Right;  CDE 8.68  . COLONOSCOPY  09/14/2005   Dr. Leonard Schwartz colitis  . COLONOSCOPY WITH ESOPHAGOGASTRODUODENOSCOPY (EGD)  04/14/2012   Dr. Gala Romney- EGD= gastric erosions of doubtful clinical significance per bx- chronic inactive gastritis, benign small bowel mucosa. TCS=colonic diverticulosis, tubular adenoma  . CORONARY ARTERY BYPASS GRAFT     LIMA-LAD; SVG-OM; SVG-DX in 1995 [place]  . NECK SURGERY     Cervical laminectomy  . PARTIAL HYSTERECTOMY    . stents x4      Family History  Problem Relation Age of Onset  . Alcohol abuse Mother   . Alcohol abuse Father   . Coronary artery disease Other   . Hypertension Other   . Crohn's disease Sister 70  . Stroke Daughter     Social History   Socioeconomic History  . Marital status: Widowed    Spouse name: Not on file  . Number of children: 4  . Years of education: Not on file  . Highest education level: Not on file  Occupational History  . Occupation: DISABLED  Employer: UNEMPLOYED  Tobacco Use  . Smoking status: Former Smoker    Packs/day: 0.50    Years: 50.00    Pack years: 25.00    Types: Cigarettes    Start date: 12/30/2018  . Smokeless tobacco: Never Used  Vaping Use  . Vaping Use: Never used  Substance and Sexual Activity  . Alcohol use: Yes    Alcohol/week: 0.0 standard drinks    Comment: occasionally   . Drug use: No  . Sexual activity: Not on file  Other Topics Concern  . Not on file  Social History Narrative   LIves alone   Social Determinants of Health   Financial Resource Strain: Not on file   Food Insecurity: Not on file  Transportation Needs: Not on file  Physical Activity: Not on file  Stress: Not on file  Social Connections: Not on file  Intimate Partner Violence: Not on file    Outpatient Medications Prior to Visit  Medication Sig Dispense Refill  . ALPRAZolam (XANAX) 0.5 MG tablet Take 1 tablet (0.5 mg total) by mouth 2 (two) times daily as needed for anxiety. 60 tablet 0  . aspirin EC 81 MG tablet Take 81 mg by mouth daily.    Marland Kitchen buPROPion (WELLBUTRIN XL) 150 MG 24 hr tablet TAKE 1 TABLET BY MOUTH IN  THE MORNING AND AT BEDTIME 180 tablet 0  . clopidogrel (PLAVIX) 75 MG tablet Take 75 mg by mouth daily.    Marland Kitchen gabapentin (NEURONTIN) 300 MG capsule Take 1 capsule (300 mg total) by mouth 2 (two) times daily. 180 capsule 0  . HYDROcodone-acetaminophen (NORCO/VICODIN) 5-325 MG tablet One tablet every four hours for pain. 30 tablet 0  . isosorbide mononitrate (IMDUR) 60 MG 24 hr tablet Take 1 tablet (60 mg total) by mouth daily. 30 tablet 0  . ketoconazole (NIZORAL) 2 % shampoo Apply 1 application topically 2 (two) times a week. 120 mL 0  . levothyroxine (SYNTHROID) 50 MCG tablet TAKE 1 TABLET BY MOUTH  DAILY BEFORE BREAKFAST 30 tablet 0  . metoprolol succinate (TOPROL XL) 25 MG 24 hr tablet Take 1 tablet (25 mg total) by mouth daily. 90 tablet 2  . nitroGLYCERIN (NITROSTAT) 0.4 MG SL tablet Place 1 tablet (0.4 mg total) under the tongue every 5 (five) minutes as needed. 90 tablet 3  . ondansetron (ZOFRAN-ODT) 4 MG disintegrating tablet Take 1 tablet (4 mg total) by mouth every 8 (eight) hours as needed for nausea or vomiting. 20 tablet 0  . pantoprazole (PROTONIX) 40 MG tablet Take 1 tablet (40 mg total) by mouth daily. 90 tablet 3  . pravastatin (PRAVACHOL) 80 MG tablet Take 1 tablet (80 mg total) by mouth daily. 90 tablet 1  . SPIRIVA HANDIHALER 18 MCG inhalation capsule 1 capsule daily.    . Tiotropium Bromide-Olodaterol (STIOLTO RESPIMAT) 2.5-2.5 MCG/ACT AERS Inhale 2 puffs  into the lungs daily. 1 Inhaler 0  . torsemide (DEMADEX) 20 MG tablet Take 2 tablets (40 mg total) by mouth daily. 180 tablet 1  . triamcinolone cream (KENALOG) 0.1 % Apply 1 application topically 2 (two) times daily. 30 g 0  . zolpidem (AMBIEN) 10 MG tablet Take 10 mg by mouth at bedtime as needed for sleep.     No facility-administered medications prior to visit.    No Known Allergies  ROS Review of Systems  Constitutional: Positive for fatigue. Negative for chills and fever.  HENT: Negative for congestion, sinus pressure, sinus pain and sore throat.   Eyes: Negative  for pain and discharge.  Respiratory: Negative for cough and shortness of breath.   Cardiovascular: Negative for chest pain and palpitations.  Gastrointestinal: Positive for constipation (Chronic). Negative for abdominal pain, diarrhea, nausea and vomiting.  Endocrine: Negative for polydipsia and polyuria.  Genitourinary: Negative for dysuria and hematuria.  Musculoskeletal: Negative for neck pain and neck stiffness.  Skin: Negative for rash.  Neurological: Positive for dizziness and light-headedness. Negative for seizures, syncope, speech difficulty, weakness, numbness and headaches.  Psychiatric/Behavioral: Negative for agitation and behavioral problems.      Objective:    Physical Exam Vitals reviewed.  Constitutional:      General: She is not in acute distress.    Appearance: She is obese. She is not diaphoretic.  HENT:     Head: Normocephalic and atraumatic.     Nose: Nose normal. No congestion.     Mouth/Throat:     Mouth: Mucous membranes are moist.     Pharynx: No posterior oropharyngeal erythema.  Eyes:     General: No scleral icterus.    Extraocular Movements: Extraocular movements intact.     Pupils: Pupils are equal, round, and reactive to light.  Cardiovascular:     Rate and Rhythm: Normal rate and regular rhythm.     Pulses: Normal pulses.     Heart sounds: Normal heart sounds. No murmur  heard.   Pulmonary:     Breath sounds: Normal breath sounds. No wheezing or rales.  Abdominal:     Palpations: Abdomen is soft.     Tenderness: There is no abdominal tenderness.  Musculoskeletal:     Cervical back: Normal range of motion and neck supple. No rigidity or tenderness.     Right lower leg: Edema (1+) present.     Left lower leg: Edema (1+) present.  Skin:    General: Skin is warm.     Findings: No rash.  Neurological:     General: No focal deficit present.     Mental Status: She is alert and oriented to person, place, and time.     Sensory: No sensory deficit.     Motor: No weakness.     Gait: Gait abnormal (Likely in the setting of baseline hip pain).  Psychiatric:        Mood and Affect: Mood normal.        Behavior: Behavior normal.     BP 130/69 (BP Location: Right Arm, Patient Position: Sitting, Cuff Size: Normal)   Pulse 66   Temp 98.2 F (36.8 C) (Oral)   Resp 18   Ht 5\' 4"  (1.626 m)   Wt 241 lb 6.4 oz (109.5 kg)   SpO2 93%   BMI 41.44 kg/m  Wt Readings from Last 3 Encounters:  08/03/20 241 lb 6.4 oz (109.5 kg)  07/29/20 248 lb (112.5 kg)  06/29/20 246 lb (111.6 kg)     Health Maintenance Due  Topic Date Due  . Hepatitis C Screening  Never done  . OPHTHALMOLOGY EXAM  Never done  . DEXA SCAN  Never done  . PNA vac Low Risk Adult (1 of 2 - PCV13) Never done  . HEMOGLOBIN A1C  03/30/2020    There are no preventive care reminders to display for this patient.  Lab Results  Component Value Date   TSH 8.55 (H) 10/15/2019   Lab Results  Component Value Date   WBC 5.2 06/30/2020   HGB 12.2 06/30/2020   HCT 37.2 06/30/2020   MCV 94 06/30/2020  PLT 172 06/30/2020   Lab Results  Component Value Date   NA 143 06/30/2020   K 4.9 06/30/2020   CO2 23 06/30/2020   GLUCOSE 128 (H) 06/30/2020   BUN 18 06/30/2020   CREATININE 1.24 (H) 06/30/2020   BILITOT 0.3 10/15/2019   ALKPHOS 59 09/30/2019   AST 21 10/15/2019   ALT 12 10/15/2019    PROT 5.8 (L) 10/15/2019   ALBUMIN 2.2 (L) 09/30/2019   CALCIUM 9.4 06/30/2020   ANIONGAP 10 06/14/2020   Lab Results  Component Value Date   CHOL 142 08/19/2017   Lab Results  Component Value Date   HDL 45 08/19/2017   Lab Results  Component Value Date   LDLCALC 66 08/19/2017   Lab Results  Component Value Date   TRIG 154 (H) 08/19/2017   Lab Results  Component Value Date   CHOLHDL 3.2 08/19/2017   Lab Results  Component Value Date   HGBA1C 6.6 (H) 09/29/2019      Assessment & Plan:   Problem List Items Addressed This Visit      Cardiovascular and Mediastinum   Benign essential hypertension - Primary    BP Readings from Last 1 Encounters:  08/03/20 130/69   Well-controlled Counseled for compliance with the medications Advised DASH diet        Respiratory   COPD (chronic obstructive pulmonary disease) ([place])    Well-controlled Need to verify home inhalers, patient to contact with the names. Used to use Spiriva and Stiolto with PRN Albuterol        Endocrine   DM2 (diabetes mellitus, type 2) ([facility])    Last HbA1C: 6.6 (09/2019) Diet controlled Avoid tighter control in her case due to risk of hypoglycemia related fall      Relevant Orders   Hemoglobin A1c   Hypothyroidism    Lab Results  Component Value Date   TSH 8.55 (H) 10/15/2019   On Levothyroxine 50 mcg recently Check TSH and free T4, adjust dose accordingly      Relevant Orders   TSH + free T4     Genitourinary   Overactive bladder    Started Myrbetriq as patient tolerated it well in the past Used to see Urologist in the past, wants to wait for now before being referred to Urology      Relevant Medications   mirabegron ER (MYRBETRIQ) 25 MG TB24 tablet    Other Visit Diagnoses    Need for hepatitis C screening test       Relevant Orders   Hepatitis C Antibody   Vitamin D deficiency       Relevant Orders   VITAMIN D 25 Hydroxy (Vit-D Deficiency, Fractures)      Meds ordered  this encounter  Medications  . mirabegron ER (MYRBETRIQ) 25 MG TB24 tablet    Sig: Take 1 tablet (25 mg total) by mouth daily.    Dispense:  30 tablet    Refill:  0    Follow-up: Return in about 4 months (around 12/03/2020) for HTN, CHF, hypothyroidism.    Lindell Spar, MD

## 2020-08-03 NOTE — Assessment & Plan Note (Signed)
Well-controlled Need to verify home inhalers, patient to contact with the names. Used to use Spiriva and Stiolto with PRN Albuterol

## 2020-08-03 NOTE — Telephone Encounter (Signed)
She does not have any inhalers on med list to refill may need different appt to discuss we have never prescribed

## 2020-08-03 NOTE — Assessment & Plan Note (Signed)
BP Readings from Last 1 Encounters:  08/03/20 130/69   Well-controlled Counseled for compliance with the medications Advised DASH diet

## 2020-08-03 NOTE — Assessment & Plan Note (Signed)
Last HbA1C: 6.6 (09/2019) Diet controlled Avoid tighter control in her case due to risk of hypoglycemia related fall

## 2020-08-03 NOTE — Assessment & Plan Note (Signed)
Started Myrbetriq as patient tolerated it well in the past Used to see Urologist in the past, wants to wait for now before being referred to Urology

## 2020-08-03 NOTE — Telephone Encounter (Signed)
Patient needing refill on her albuterol and flovent inhalers

## 2020-08-04 ENCOUNTER — Other Ambulatory Visit: Payer: Self-pay | Admitting: Internal Medicine

## 2020-08-04 ENCOUNTER — Ambulatory Visit: Payer: Medicare Other | Admitting: Internal Medicine

## 2020-08-04 DIAGNOSIS — J449 Chronic obstructive pulmonary disease, unspecified: Secondary | ICD-10-CM

## 2020-08-04 LAB — HEMOGLOBIN A1C
Est. average glucose Bld gHb Est-mCnc: 146 mg/dL
Hgb A1c MFr Bld: 6.7 % — ABNORMAL HIGH (ref 4.8–5.6)

## 2020-08-04 LAB — VITAMIN D 25 HYDROXY (VIT D DEFICIENCY, FRACTURES): Vit D, 25-Hydroxy: 24.9 ng/mL — ABNORMAL LOW (ref 30.0–100.0)

## 2020-08-04 LAB — HEPATITIS C ANTIBODY: Hep C Virus Ab: <0.1 s/co ratio (ref 0.0–0.9)

## 2020-08-04 LAB — TSH+FREE T4
Free T4: 1.21 ng/dL (ref 0.82–1.77)
TSH: 1.14 u[IU]/mL (ref 0.450–4.500)

## 2020-08-04 MED ORDER — FLOVENT HFA 110 MCG/ACT IN AERO: 1.0000 | INHALATION_SPRAY | Freq: Two times a day (BID) | RESPIRATORY_TRACT | 12 refills | Status: DC

## 2020-08-04 MED ORDER — ALBUTEROL SULFATE HFA 108 (90 BASE) MCG/ACT IN AERS: 2.0000 | INHALATION_SPRAY | Freq: Four times a day (QID) | RESPIRATORY_TRACT | 0 refills | Status: DC | PRN

## 2020-08-04 NOTE — Telephone Encounter (Signed)
Dr Posey Pronto needed the names of the inhalers so he could call these in

## 2020-08-04 NOTE — Telephone Encounter (Signed)
These are the names of the inhalers

## 2020-08-29 ENCOUNTER — Telehealth: Payer: Self-pay

## 2020-08-29 ENCOUNTER — Other Ambulatory Visit: Payer: Self-pay | Admitting: *Deleted

## 2020-08-29 ENCOUNTER — Other Ambulatory Visit: Payer: Self-pay | Admitting: Internal Medicine

## 2020-08-29 DIAGNOSIS — F419 Anxiety disorder, unspecified: Secondary | ICD-10-CM

## 2020-08-29 DIAGNOSIS — N3281 Overactive bladder: Secondary | ICD-10-CM

## 2020-08-29 MED ORDER — MIRABEGRON ER 25 MG PO TB24: 25.0000 mg | ORAL_TABLET | Freq: Every day | ORAL | 0 refills | Status: DC

## 2020-08-29 MED ORDER — ALPRAZOLAM 0.5 MG PO TABS
0.5000 mg | ORAL_TABLET | Freq: Two times a day (BID) | ORAL | 0 refills | Status: DC | PRN
Start: 2020-08-29 — End: 2020-11-01

## 2020-08-29 NOTE — Telephone Encounter (Signed)
Patient requesting refills on xanax ph# [phone]

## 2020-08-29 NOTE — Telephone Encounter (Signed)
Please send in refill if you would like to refill the medication

## 2020-08-29 NOTE — Telephone Encounter (Signed)
Patient granddaughter Joelene Millin called need med refill on mirabegron ER (MYRBETRIQ) 25 MG TB24 tablet to be sent to [facility].

## 2020-08-29 NOTE — Telephone Encounter (Signed)
Refilled. Thanks!

## 2020-08-29 NOTE — Telephone Encounter (Signed)
Medication sent to pharmacy  

## 2020-08-31 ENCOUNTER — Other Ambulatory Visit: Payer: Self-pay | Admitting: *Deleted

## 2020-08-31 ENCOUNTER — Telehealth: Payer: Self-pay

## 2020-08-31 DIAGNOSIS — N3281 Overactive bladder: Secondary | ICD-10-CM

## 2020-08-31 MED ORDER — MIRABEGRON ER 25 MG PO TB24: 25.0000 mg | ORAL_TABLET | Freq: Every day | ORAL | 0 refills | Status: DC

## 2020-08-31 NOTE — Telephone Encounter (Signed)
Medication sent to [facility]

## 2020-08-31 NOTE — Telephone Encounter (Signed)
Patient needing medication for bladder [facility] so she can get it cheaper ph# [phone]

## 2020-09-09 ENCOUNTER — Ambulatory Visit: Payer: Medicare Other | Admitting: Family Medicine

## 2020-09-22 ENCOUNTER — Telehealth: Payer: Self-pay | Admitting: Internal Medicine

## 2020-09-22 NOTE — Telephone Encounter (Signed)
Tried calling patient to  schedule Medicare Annual Wellness Visit (AWV) either virtually or in office.  No answer  AWV-I PER PALMET[place] 04/30/09    please schedule at anytime with [facility]  health coach  This should be a 40 minute visit.

## 2020-09-26 ENCOUNTER — Other Ambulatory Visit: Payer: Self-pay | Admitting: Internal Medicine

## 2020-09-27 ENCOUNTER — Encounter: Payer: Self-pay | Admitting: Orthopaedic Surgery

## 2020-09-27 ENCOUNTER — Emergency Department (HOSPITAL_COMMUNITY): Payer: Medicare Other

## 2020-09-27 ENCOUNTER — Ambulatory Visit (INDEPENDENT_AMBULATORY_CARE_PROVIDER_SITE_OTHER): Payer: Medicare Other | Admitting: Orthopaedic Surgery

## 2020-09-27 ENCOUNTER — Other Ambulatory Visit: Payer: Self-pay

## 2020-09-27 ENCOUNTER — Emergency Department (HOSPITAL_COMMUNITY)
Admission: EM | Admit: 2020-09-27 | Discharge: 2020-09-27 | Disposition: A | Discharge status: Home / Self Care | Payer: Medicare Other | Attending: Emergency Medicine | Admitting: Emergency Medicine

## 2020-09-27 ENCOUNTER — Telehealth: Payer: Self-pay | Admitting: Orthopaedic Surgery

## 2020-09-27 ENCOUNTER — Encounter (HOSPITAL_COMMUNITY): Payer: Self-pay | Admitting: *Deleted

## 2020-09-27 DIAGNOSIS — E039 Hypothyroidism, unspecified: Secondary | ICD-10-CM | POA: Insufficient documentation

## 2020-09-27 DIAGNOSIS — E119 Type 2 diabetes mellitus without complications: Secondary | ICD-10-CM | POA: Insufficient documentation

## 2020-09-27 DIAGNOSIS — Z79899 Other long term (current) drug therapy: Secondary | ICD-10-CM | POA: Insufficient documentation

## 2020-09-27 DIAGNOSIS — Z951 Presence of aortocoronary bypass graft: Secondary | ICD-10-CM | POA: Insufficient documentation

## 2020-09-27 DIAGNOSIS — Z7902 Long term (current) use of antithrombotics/antiplatelets: Secondary | ICD-10-CM | POA: Insufficient documentation

## 2020-09-27 DIAGNOSIS — I509 Heart failure, unspecified: Secondary | ICD-10-CM | POA: Diagnosis not present

## 2020-09-27 DIAGNOSIS — M542 Cervicalgia: Secondary | ICD-10-CM

## 2020-09-27 DIAGNOSIS — Z87891 Personal history of nicotine dependence: Secondary | ICD-10-CM | POA: Diagnosis not present

## 2020-09-27 DIAGNOSIS — S161XXA Strain of muscle, fascia and tendon at neck level, initial encounter: Secondary | ICD-10-CM

## 2020-09-27 DIAGNOSIS — X58XXXA Exposure to other specified factors, initial encounter: Secondary | ICD-10-CM | POA: Insufficient documentation

## 2020-09-27 DIAGNOSIS — I2510 Atherosclerotic heart disease of native coronary artery without angina pectoris: Secondary | ICD-10-CM | POA: Diagnosis not present

## 2020-09-27 DIAGNOSIS — J449 Chronic obstructive pulmonary disease, unspecified: Secondary | ICD-10-CM | POA: Diagnosis not present

## 2020-09-27 DIAGNOSIS — Z7951 Long term (current) use of inhaled steroids: Secondary | ICD-10-CM | POA: Diagnosis not present

## 2020-09-27 DIAGNOSIS — I110 Hypertensive heart disease with heart failure: Secondary | ICD-10-CM | POA: Insufficient documentation

## 2020-09-27 DIAGNOSIS — Z7982 Long term (current) use of aspirin: Secondary | ICD-10-CM | POA: Insufficient documentation

## 2020-09-27 DIAGNOSIS — R200 Anesthesia of skin: Secondary | ICD-10-CM | POA: Diagnosis not present

## 2020-09-27 DIAGNOSIS — S199XXA Unspecified injury of neck, initial encounter: Secondary | ICD-10-CM | POA: Diagnosis present

## 2020-09-27 DIAGNOSIS — R1084 Generalized abdominal pain: Secondary | ICD-10-CM | POA: Diagnosis not present

## 2020-09-27 MED ORDER — LORAZEPAM 0.5 MG PO TABS
0.5000 mg | ORAL_TABLET | Freq: Once | ORAL | Status: AC
  Administered 2020-09-27: 0.5 mg via ORAL
  Filled 2020-09-27: qty 1

## 2020-09-27 MED ORDER — OXYCODONE-ACETAMINOPHEN 5-325 MG PO TABS
1.0000 | ORAL_TABLET | Freq: Once | ORAL | Status: AC
  Administered 2020-09-27: 1 via ORAL
  Filled 2020-09-27: qty 1

## 2020-09-27 MED ORDER — ONDANSETRON 4 MG PO TBDP
4.0000 mg | ORAL_TABLET | Freq: Once | ORAL | Status: AC
  Administered 2020-09-27: 4 mg via ORAL
  Filled 2020-09-27: qty 1

## 2020-09-27 MED ORDER — HYDROCODONE-ACETAMINOPHEN 5-325 MG PO TABS: 1.0000 | ORAL_TABLET | Freq: Four times a day (QID) | ORAL | 0 refills | Status: DC | PRN

## 2020-09-27 NOTE — ED Provider Notes (Signed)
Emergency Medicine Provider Triage Evaluation Note  [name] , a 75 y.o. female  was evaluated in triage.  Pt complains of Severe neck pain x 10 days. Tried neck braces, heat, ice and norco- NO relief  Review of Systems  Positive: Severe neck pain BL radiating down R arm Negative: Fever, headache  Physical Exam  BP (!) 147/67 (BP Location: Right Wrist)   Pulse (!) 118   Temp 98.4 F (36.9 C) (Oral)   Resp 17   SpO2 97%  Gen:   Awake, no distress   Resp:  Normal effort  MSK:   Moves extremities without difficulty  Other:  Patient holding neck in Hands- Hypertonic/ spastic neck mm with guarding- Jumps in pain if she relaxes shoulder   Medical Decision Making  Medically screening exam initiated at 5:10 PM.  Appropriate orders placed.  [name] was informed that the remainder of the evaluation will be completed by another provider, this initial triage assessment does not replace that evaluation, and the importance of remaining in the ED until their evaluation is complete.  Patient with concern for radiculopathy- ct ordered- may consider MRI   Margarita Mail, PA-C 09/27/20 1712    Hayden Rasmussen, MD 09/28/20 1014

## 2020-09-27 NOTE — Telephone Encounter (Signed)
Patient/grandaughter Rutherford Nail phone [phone] called to relay that patient fell off of her computer chair approximately 10 days ago, and did not seek treatment at that time, or since. States does not want to go to emergency room which we have strongly advised, due to needing workup and evaluation. Patient has not been open to going to Emergency department; granddaughter Joelene Millin relays that patient has history of neck fusion about 30 years ago. States called primary care, who advised to call orthopaedics. Please advise, as now patient is having more pain and said cannot turn neck.

## 2020-09-27 NOTE — Progress Notes (Signed)
Virtual Visit via Telephone Note  I connected with@ on 09/27/20 at  2:20 PM EDT by telephone and verified that I am speaking with the correct person using two identifiers.  Location: Patient: home Provider: [place]   I discussed the limitations, risks, security and privacy concerns of performing an evaluation and management service by telephone and the availability of in person appointments. I also discussed with the patient that there may be a patient responsible charge related to this service. The patient expressed understanding and agreed to proceed.   History of Present Illness: She fell at home 10 days ago and hurt her neck.  She continues to have pain.  She has tried ice, heat, rubs and Tylenol and also pain medicine.  She cannot turn her neck to the left.  She was advised to go to the ER but she did not.  She wanted to talk to me.  She has numbness on the left side of her arm and hand.  I have told her to go to the ER and be evaluated. She needs X-rays of the neck and probably a CT.  After some discussion, she has agreed to go to the ER this afternoon.   Observations/Objective: Per above.  Assessment and Plan: Neck pain after fall  Follow Up Instructions: Go to ER   I discussed the assessment and treatment plan with the patient. The patient was provided an opportunity to ask questions and all were answered. The patient agreed with the plan and demonstrated an understanding of the instructions.   The patient was advised to call back or seek an in-person evaluation if the symptoms worsen or if the condition fails to improve as anticipated.  I provided 9 minutes of non-face-to-face time during this encounter.   Sanjuana Kava, MD

## 2020-09-27 NOTE — Discharge Instructions (Addendum)
You are seen in the emergency department for ongoing neck pain.  You had a CAT scan and an MRI that did not show any serious findings.  The MRI did show some signs of injury to the right-sided neck muscle.  We are prescribing a short course of some narcotic pain medicine.  Be advised that this may make you dizzy nauseous and constipated.  Please contact your primary care doctor for close follow-up.  Return to the emergency department for any worsening or concerning symptoms

## 2020-09-27 NOTE — Telephone Encounter (Signed)
Best they get X-rays via ER.  I can talk on a virtual visit but that will most likely be my recommendation.  Neck injury can be very serious.

## 2020-09-27 NOTE — ED Notes (Signed)
Patient transported to CT 

## 2020-09-27 NOTE — Telephone Encounter (Signed)
Called granddaughter/designated contact Joelene Millin [phone] and relayed. States okay to try calling patient at her own [phone]. I called patient and relayed all information per Dr Luna Glasgow; she is aware.

## 2020-09-27 NOTE — ED Provider Notes (Signed)
[facility] EMERGENCY DEPARTMENT Provider Note   CSN: [id number] Arrival date & time: 09/27/20  1613     History Chief Complaint  Patient presents with  . Neck Injury    [name] is a 75 y.o. female.  She has a history of diabetes CHF CAD.  About a week ago she noticed some numbness on her right hand.  The next morning when she woke up she had severe posterior neck pain.  Worse with movement.  She feels 2 kn at the base of her skull and it helps when she pushes on those.  The pain sometimes wakes her up at night.  Her numbness is gone away and there is no weakness in the arms or legs.  She saw orthopedics today and they referred her here for imaging.  There was no trauma.  She has had prior neck surgery 30 years ago.  No bowel or bladder incontinence.  The history is provided by the patient.  Neck Injury This is a new problem. The current episode started more than 1 week ago. The problem occurs constantly. The problem has not changed since onset.Pertinent negatives include no chest pain, no abdominal pain, no headaches and no shortness of breath. The symptoms are aggravated by twisting and bending. Nothing relieves the symptoms. She has tried rest for the symptoms. The treatment provided no relief.       Past Medical History:  Diagnosis Date  . Acute ischemic colitis ([place]) 09/11/2005  . Anxiety   . Arthritis   . Atherosclerotic vascular disease    Calcified plaque at the origin of the celiac and SMA  . CHF (congestive heart failure) ([place])   . Coronary atherosclerosis of native coronary artery    Mulitvessel LVEF 50-50%, DES circ 1/11, occluded SVG to OM and SVG to diagonal , LVEF 60%  . Depression   . Diverticulosis of colon   . Essential hypertension   . Hypothyroidism   . Mesenteric ischemia ([place])   . Mitral regurgitation    Mild to moderate  . Mixed hyperlipidemia   . Myocardial infarction ([place]) 1990  . Obesity   . Orthostatic hypotension   . PAD (peripheral  artery disease) ([place])   . PVC's (premature ventricular contractions)   . Schizophrenia ([place])   . Sleep apnea    CPAP  . Tubular adenoma   . Type 2 diabetes mellitus [facility])     Patient Active Problem List   Diagnosis Date Noted  . Overactive bladder 08/03/2020  . Arthritis of knee 05/13/2020  . Right leg pain 05/09/2020  . Dermatitis 03/11/2020  . Dizziness 02/11/2020  . Anxiety 02/11/2020  . Ischemic colitis ([place]) 09/30/2019  . Vascular complications of mesenteric artery   . Colitis 09/29/2019  . DM2 (diabetes mellitus, type 2) ([place]) 09/29/2019  . GERD (gastroesophageal reflux disease) 09/29/2019  . Hypothyroidism 09/29/2019  . Surgical site infection 06/30/2019  . Carotid stenosis, right 11/20/2017  . TIA (transient ischemic attack) 11/20/2017  . COPD (chronic obstructive pulmonary disease) ([place]) 09/27/2017  . OSA (obstructive sleep apnea) 09/24/2017  . MDD (major depressive disorder), recurrent episode, moderate ([place]) 11/05/2016  . PAD (peripheral artery disease) ([place]) 12/06/2014  . Chronic mesenteric ischemia ([place]) 08/12/2012  . Chronic diarrhea 03/18/2012  . Unintentional weight loss 03/18/2012  . Carotid bruit 01/03/2012  . Depression 06/04/2011  . SINUS BRADYCARDIA 10/13/2009  . ORTHOSTATIC HYPOTENSION 10/04/2009  . Dyspnea on exertion 05/06/2009  . MITRAL REGURGITATION 03/18/2009  . Tobacco abuse 02/11/2009  .  HLD (hyperlipidemia) 01/19/2009  . Benign essential hypertension 01/19/2009  . CAD, NATIVE VESSEL 01/19/2009    Past Surgical History:  Procedure Laterality Date  . BACK SURGERY     Lumbar spine surgery  . Carotid endarectomy Bilateral   . CARPAL TUNNEL RELEASE     Bilateral  . CATARACT EXTRACTION W/PHACO Right 08/24/2013   Procedure: CATARACT EXTRACTION PHACO AND INTRAOCULAR LENS PLACEMENT (IOC);  Surgeon: Tonny Branch, MD;  Location: [facility];  Service: Ophthalmology;  Laterality: Right;  CDE 8.68  . COLONOSCOPY  09/14/2005   Dr. Leonard Schwartz colitis   . COLONOSCOPY WITH ESOPHAGOGASTRODUODENOSCOPY (EGD)  04/14/2012   Dr. Gala Romney- EGD= gastric erosions of doubtful clinical significance per bx- chronic inactive gastritis, benign small bowel mucosa. TCS=colonic diverticulosis, tubular adenoma  . CORONARY ARTERY BYPASS GRAFT     LIMA-LAD; SVG-OM; SVG-DX in 1995 [place]  . NECK SURGERY     Cervical laminectomy  . PARTIAL HYSTERECTOMY    . stents x4       OB History    Gravida  4   Para  4   Term  4   Preterm      AB      Living  4     SAB      IAB      Ectopic      Multiple      Live Births              Family History  Problem Relation Age of Onset  . Alcohol abuse Mother   . Alcohol abuse Father   . Coronary artery disease Other   . Hypertension Other   . Crohn's disease Sister 22  . Stroke Daughter     Social History   Tobacco Use  . Smoking status: Former Smoker    Packs/day: 0.50    Years: 50.00    Pack years: 25.00    Types: Cigarettes    Start date: 12/30/2018  . Smokeless tobacco: Never Used  Vaping Use  . Vaping Use: Never used  Substance Use Topics  . Alcohol use: Yes    Alcohol/week: 0.0 standard drinks    Comment: occasionally   . Drug use: No    Home Medications Prior to Admission medications   Medication Sig Start Date End Date Taking? Authorizing Provider  albuterol (VENTOLIN HFA) 108 (90 Base) MCG/ACT inhaler Inhale 2 puffs into the lungs every 6 (six) hours as needed for wheezing or shortness of breath. 08/04/20   Lindell Spar, MD  ALPRAZolam Duanne Moron) 0.5 MG tablet Take 1 tablet (0.5 mg total) by mouth 2 (two) times daily as needed for anxiety. 08/29/20   Lindell Spar, MD  aspirin EC 81 MG tablet Take 81 mg by mouth daily.    [provider]  buPROPion (WELLBUTRIN XL) 150 MG 24 hr tablet TAKE 1 TABLET BY MOUTH IN  THE MORNING AND AT BEDTIME 04/14/20   Lindell Spar, MD  clopidogrel (PLAVIX) 75 MG tablet Take 75 mg by mouth daily.    [provider]  fluticasone  (FLOVENT HFA) 110 MCG/ACT inhaler Inhale 1 puff into the lungs in the morning and at bedtime. 08/04/20   Lindell Spar, MD  gabapentin (NEURONTIN) 300 MG capsule Take 1 capsule (300 mg total) by mouth 2 (two) times daily. 04/14/20   Lindell Spar, MD  HYDROcodone-acetaminophen (NORCO/VICODIN) 5-325 MG tablet One tablet every four hours for pain. 05/10/20   Sanjuana Kava, MD  isosorbide mononitrate (IMDUR)  60 MG 24 hr tablet Take 1 tablet (60 mg total) by mouth daily. 06/28/20   Lindell Spar, MD  ketoconazole (NIZORAL) 2 % shampoo Apply 1 application topically 2 (two) times a week. 03/14/20   Lindell Spar, MD  levothyroxine (SYNTHROID) 50 MCG tablet TAKE 1 TABLET BY MOUTH  DAILY BEFORE BREAKFAST 07/25/20   Lindell Spar, MD  metoprolol succinate (TOPROL XL) 25 MG 24 hr tablet Take 1 tablet (25 mg total) by mouth daily. 06/29/20   Satira Sark, MD  mirabegron ER (MYRBETRIQ) 25 MG TB24 tablet Take 1 tablet (25 mg total) by mouth daily. 08/31/20   Lindell Spar, MD  nitroGLYCERIN (NITROSTAT) 0.4 MG SL tablet Place 1 tablet (0.4 mg total) under the tongue every 5 (five) minutes as needed. 02/16/11   Satira Sark, MD  ondansetron (ZOFRAN-ODT) 4 MG disintegrating tablet Take 1 tablet (4 mg total) by mouth every 8 (eight) hours as needed for nausea or vomiting. 10/15/19   Pahwani, Michell Heinrich, MD  pantoprazole (PROTONIX) 40 MG tablet Take 1 tablet (40 mg total) by mouth daily. 07/21/20   Lindell Spar, MD  pravastatin (PRAVACHOL) 80 MG tablet TAKE 1 TABLET BY MOUTH  DAILY 09/27/20   Lindell Spar, MD  Tiotropium Bromide-Olodaterol (STIOLTO RESPIMAT) 2.5-2.5 MCG/ACT AERS Inhale 2 puffs into the lungs daily. 09/27/17   Collene Gobble, MD  torsemide (DEMADEX) 20 MG tablet Take 2 tablets (40 mg total) by mouth daily. 06/07/20   Satira Sark, MD  triamcinolone cream (KENALOG) 0.1 % Apply 1 application topically 2 (two) times daily. 03/11/20   Lindell Spar, MD  zolpidem (AMBIEN) 10 MG tablet  Take 10 mg by mouth at bedtime as needed for sleep.    [provider]    Allergies    Patient has no known allergies.  Review of Systems   Review of Systems  Constitutional: Negative for fever.  HENT: Negative for sore throat.   Eyes: Negative for visual disturbance.  Respiratory: Negative for shortness of breath.   Cardiovascular: Negative for chest pain.  Gastrointestinal: Negative for abdominal pain.  Genitourinary: Negative for dysuria.  Musculoskeletal: Positive for neck pain.  Skin: Negative for rash.  Neurological: Positive for numbness. Negative for headaches.    Physical Exam Updated Vital Signs BP (!) 147/67 (BP Location: Right Wrist)   Pulse (!) 118   Temp 98.4 F (36.9 C) (Oral)   Resp 17   SpO2 97%   Physical Exam Vitals and nursing note reviewed.  Constitutional:      General: She is not in acute distress.    Appearance: Normal appearance. She is well-developed.  HENT:     Head: Normocephalic and atraumatic.  Eyes:     Conjunctiva/sclera: Conjunctivae normal.  Neck:     Comments: Diffuse tenderness midline and paracervical neck.  Positive muscle spasm.  Anterior neck nontender trach is midline.  No difficulty swallowing or speaking Cardiovascular:     Rate and Rhythm: Normal rate and regular rhythm.     Heart sounds: No murmur heard.   Pulmonary:     Effort: Pulmonary effort is normal. No respiratory distress.     Breath sounds: Normal breath sounds.  Abdominal:     Palpations: Abdomen is soft.     Tenderness: There is no abdominal tenderness.  Musculoskeletal:        General: No deformity or signs of injury.     Cervical back: Tenderness present.  Skin:  General: Skin is warm and dry.  Neurological:     General: No focal deficit present.     Mental Status: She is alert.     Cranial Nerves: No cranial nerve deficit.     Sensory: No sensory deficit.     Motor: No weakness.     ED Results / Procedures / Treatments   Labs (all  labs ordered are listed, but only abnormal results are displayed) Labs Reviewed - No data to display  EKG None  Radiology CT Cervical Spine Wo Contrast  Result Date: 09/27/2020 CLINICAL DATA:  Right side neck pain radiating into the right arm for 10 days. History of prior cervical surgery. EXAM: CT CERVICAL SPINE WITHOUT CONTRAST TECHNIQUE: Multidetector CT imaging of the cervical spine was performed without intravenous contrast. Multiplanar CT image reconstructions were also generated. COMPARISON:  None. FINDINGS: Alignment: There is a reversal of the normal cervical lordosis. Trace anterolisthesis C2 on C3 and C3 on C4 is due to advanced facet degenerative change. Skull base and vertebrae: No acute fracture. No primary bone lesion or focal pathologic process. The patient is status post C4-6 laminectomy and fusion. The levels are solidly fused. Soft tissues and spinal canal: No prevertebral fluid or swelling. No visible canal hematoma. Disc levels: As visualized by CT scan, the central canal and foramina appear open at all levels. Scattered facet degenerative disease is worst at C2-3 and C3-4. Upper chest: Lung apices are clear. Other: None. IMPRESSION: No acute abnormality or finding to explain the patient's symptoms. Status post C4-6 laminectomy and fusion without evidence of complication. Advanced facet degenerative disease C2-3 and C3-4. Electronically Signed   By: Inge Rise M.D.   On: 09/27/2020 17:32   MR CERVICAL SPINE WO CONTRAST  Result Date: 09/27/2020 CLINICAL DATA:  Myelopathy, acute or progressive. Severe neck pain. Recent fall. EXAM: MRI CERVICAL SPINE WITHOUT CONTRAST TECHNIQUE: Multiplanar, multisequence MR imaging of the cervical spine was performed. No intravenous contrast was administered. COMPARISON:  CT cervical spine 09/27/2020 FINDINGS: Alignment: Mild anterolisthesis C2-3 and C3-4. Acute kyphosis at C4-5 unchanged from the prior CT. Vertebrae: No bone marrow edema or  fracture identified. Cord: Cord evaluation is limited due to artifact from posterior wire fusion C5 through C7. Allowing for this, no definite cord signal abnormality is identified. Posterior Fossa, vertebral arteries, paraspinal tissues: Soft tissue edema in the right muscles C2 through C4. This is asymmetric and likely is related to acute muscle injury. No fluid collection or mass identified. Disc levels: C2-3: Bilateral facet degeneration. Mild disc bulging and mild anterolisthesis. Moderate spinal stenosis and moderate foraminal stenosis bilaterally C3-4: Mild anterolisthesis. Mild uncinate spurring. Bilateral facet degeneration. Cord flattening with mild spinal stenosis. Mild foraminal stenosis bilaterally. C4-5: Disc degeneration with diffuse uncinate spurring. Posterior laminectomy. Mild cord flattening likely due to kyphosis. Spinal canal and neural foramina appear patent. C5-6: Solid interbody bone fusion. Posterior wire fusion causing artifact and limited evaluation of the cord and spinal canal. Posterior laminectomy with adequate spinal canal. Graph C6-7 posterior wire fusion. Laminectomy. Spinal canal neural foraminal patent C7-T1: Small central disc protrusion.  Negative for stenosis. T1-2: Small central disc protrusion. Mild foraminal narrowing bilaterally due to spurring. IMPRESSION: Asymmetric muscle edema on the right C2 through C4 likely due to acute injury. Solid interbody bone fusion C4 through C6. Posterior laminectomy C4 through C6 with posterior wire fusion. Posterior wire causing artifact Motion degraded study. Moderate spinal stenosis at C2-3 with moderate foraminal stenosis bilaterally Mild spinal stenosis at C3-4 with  mild foraminal stenosis bilaterally. Motion degraded study. Electronically Signed   By: Franchot Gallo M.D.   On: 09/27/2020 18:08    Procedures Procedures   Medications Ordered in ED Medications  oxyCODONE-acetaminophen (PERCOCET/ROXICET) 5-325 MG per tablet 1 tablet  (has no administration in time range)  ondansetron (ZOFRAN-ODT) disintegrating tablet 4 mg (has no administration in time range)  LORazepam (ATIVAN) tablet 0.5 mg (has no administration in time range)    ED Course  I have reviewed the triage vital signs and the nursing notes.  Pertinent labs & imaging results that were available during my care of the patient were reviewed by me and considered in my medical decision making (see chart for details).  Clinical Course as of 09/28/20 1002  Tue Sep 27, 2020  1755 Daughter is here now.  She said the patient had actually fallen out of a rolling computer chair 10 days ago.  She was fine afterwards but has had worsening neck pain for about a week. [MB]  4097 Patient's MRI does not show any significant findings other than asymmetric muscle edema on the right side C2-C4 likely due to injury.  Reviewed this with patient.  She now recalls along with the fall she was in the yard pulling weeds a lot before this happened.  Will discharge with some pain medication.  They will follow-up with her primary care doctor. [MB]    Clinical Course User Index [MB] Hayden Rasmussen, MD   MDM Rules/Calculators/A&P                         This patient complains of posterior neck pain and limited range of motion; this involves an extensive number of treatment Options and is a complaint that carries with it a high risk of complications and Morbidity. The differential includes fracture, subluxation, cord injury, radiculopathy, disc herniation, muscle spasm  I ordered medication oral pain medication I ordered imaging studies which included CT and MRI of cervical spine and I independently    visualized and interpreted imaging which showed no acute fractures.  Does have some canal stenosis although no gross impingement, acute right paracervical muscle edema consistent with injury Additional history obtained from patient's daughter Previous records obtained and reviewed in  epic, no recent admissions  After the interventions stated above, I reevaluated the patient and found patient still to be symptomatic as far as pain.  No neurodeficits.  Will cover with pain medications and recommended close follow-up with PCP.  Return instructions discussed   Final Clinical Impression(s) / ED Diagnoses Final diagnoses:  Acute strain of neck muscle, initial encounter    Rx / DC Orders ED Discharge Orders         Ordered    HYDROcodone-acetaminophen (NORCO/VICODIN) 5-325 MG tablet  Every 6 hours PRN        09/27/20 1859           Hayden Rasmussen, MD 09/28/20 1005

## 2020-09-27 NOTE — ED Triage Notes (Signed)
Neck injury for over 10 days, referred by [facility]  For possible CT

## 2020-09-29 ENCOUNTER — Ambulatory Visit: Payer: Medicare Other | Admitting: Internal Medicine

## 2020-09-29 NOTE — Progress Notes (Addendum)
Cardiology Office Note  Date: 09/30/2020   ID: [name], DOB December 12, 1945, MRN [id number]  PCP:  Lindell Spar, MD  Cardiologist:  Rozann Lesches, MD Electrophysiologist:  None   Chief Complaint: 3 week follow up  History of Present Illness: [name] is a 75 y.o. female with a history of chronic combined systolic and diastolic heart failure,  HTN, hypothyroidism, MR. CABG with DES to LCX, graft disease with occluded SVG to OM and SVG to Diag.  Previously saw Dr Domenic Polite 06/07/2020 with evidence of fluid overload. Demadex increased to 40 mg daily and to repeat echocardiogram with follow up BMET in one week. Plan to follow up in 2-3 weeks. Was continuing ASA, Plavix, Imdur Lopressor, and Pravachol. TSH was elevated 8.55 and on Synthroid.  Saw Cecilie Kicks NP on 06/29/2020. BMET with mild increase in Creat. Repeat echo demonstrated decreased EF 45-50%.  Lopressor was changed to Toprol XL 25 but she had not started the medication at that time. She had taken only 3 days of Torsemide and complained of severe bladder pain. She complained of occasional rapid HR and other times felt near syncopal sensation. Occasional chest tightness with near syncopal / lightheaded sensation. She was worried about low BP on Toprol XL due to having low BP on Lopressor previously. She planned to take torsemide as tolerated. Zio monitor was ordered. Orthostatic VS were negative. U/A ordered for urinary retention and burning. Plan was to refer to urology. CBC , BMET.  She was last here on 07/29/2020 for follow-up.  She had not been taking her torsemide as directed due to complaints of bladder pain secondary to possible urinary retention.  She had seen Dr. Alyson Ingles in the past and had been treated for urinary retention per her statement.  Stated she was on medication which helped her with this in the past but currently is not taking it.  She had a recent ZIO monitor for complaints of rapid heart rate and  feeling dizzy and near syncopal.  ZIO monitor results below showed no significant sustained arrhythmias.  He stated sometimes she stood erect she felt dizzy and looks downward when this occurs.  This seemed to help.  She denied any syncopal episodes or feeling near syncopal, only dizziness.  Has a history of TIAs and was wondering if this may be affecting her balance or dizziness.  CBC was normal.  Basic metabolic panel showed glucose of 128, creatinine 1.24, GFR 45.  UA was negative for infection.  She is here for follow-up today.  She denies any further issues with dizziness, palpitations or arrhythmias, orthostatic symptoms.  She has been having some issues with her neck when she recently fell and went to the emergency department for evaluation.  She states the neuro imaging showed no issues.  She continues to have what she describes as muscular spasms in her neck.  Continues to have occasional balance issues.  She is down 6 pounds since prior measurement on 07/29/2020 when she weighed 248 pounds.  Today's weight is 242.  She denies any significant dyspnea or lower extremity edema.  She is compliant with her medication therapy.  Current cardiac regimen includes aspirin 81 mg daily, Plavix 75 mg daily, Imdur 60 mg daily, Toprol XL 25 mg daily, as needed nitroglycerin, torsemide 40 mg daily, pravastatin 80 mg daily.   Past Medical History:  Diagnosis Date  . Acute ischemic colitis ([place]) 09/11/2005  . Anxiety   . Arthritis   . Atherosclerotic vascular  disease    Calcified plaque at the origin of the celiac and SMA  . CHF (congestive heart failure) ([place])   . Coronary atherosclerosis of native coronary artery    Mulitvessel LVEF 50-50%, DES circ 1/11, occluded SVG to OM and SVG to diagonal , LVEF 60%  . Depression   . Diverticulosis of colon   . Essential hypertension   . Hypothyroidism   . Mesenteric ischemia ([place])   . Mitral regurgitation    Mild to moderate  . Mixed hyperlipidemia   . Myocardial  infarction ([place]) 1990  . Obesity   . Orthostatic hypotension   . PAD (peripheral artery disease) ([place])   . PVC's (premature ventricular contractions)   . Schizophrenia ([place])   . Sleep apnea    CPAP  . Tubular adenoma   . Type 2 diabetes mellitus ([place])     Past Surgical History:  Procedure Laterality Date  . BACK SURGERY     Lumbar spine surgery  . Carotid endarectomy Bilateral   . CARPAL TUNNEL RELEASE     Bilateral  . CATARACT EXTRACTION W/PHACO Right 08/24/2013   Procedure: CATARACT EXTRACTION PHACO AND INTRAOCULAR LENS PLACEMENT (IOC);  Surgeon: Tonny Branch, MD;  Location: [facility];  Service: Ophthalmology;  Laterality: Right;  CDE 8.68  . COLONOSCOPY  09/14/2005   Dr. Leonard Schwartz colitis  . COLONOSCOPY WITH ESOPHAGOGASTRODUODENOSCOPY (EGD)  04/14/2012   Dr. Gala Romney- EGD= gastric erosions of doubtful clinical significance per bx- chronic inactive gastritis, benign small bowel mucosa. TCS=colonic diverticulosis, tubular adenoma  . CORONARY ARTERY BYPASS GRAFT     LIMA-LAD; SVG-OM; SVG-DX in 1995 [place]  . NECK SURGERY     Cervical laminectomy  . PARTIAL HYSTERECTOMY    . stents x4      Current Outpatient Medications  Medication Sig Dispense Refill  . albuterol (VENTOLIN HFA) 108 (90 Base) MCG/ACT inhaler Inhale 2 puffs into the lungs every 6 (six) hours as needed for wheezing or shortness of breath. 8 g 0  . ALPRAZolam (XANAX) 0.5 MG tablet Take 1 tablet (0.5 mg total) by mouth 2 (two) times daily as needed for anxiety. 60 tablet 0  . aspirin EC 81 MG tablet Take 81 mg by mouth daily.    Marland Kitchen buPROPion (WELLBUTRIN XL) 150 MG 24 hr tablet TAKE 1 TABLET BY MOUTH IN  THE MORNING AND AT BEDTIME 180 tablet 0  . clopidogrel (PLAVIX) 75 MG tablet Take 75 mg by mouth daily.    . fluticasone (FLOVENT HFA) 110 MCG/ACT inhaler Inhale 1 puff into the lungs in the morning and at bedtime. 1 each 12  . gabapentin (NEURONTIN) 300 MG capsule Take 1 capsule (300 mg total) by mouth 2 (two) times  daily. 180 capsule 0  . isosorbide mononitrate (IMDUR) 60 MG 24 hr tablet Take 1 tablet (60 mg total) by mouth daily. 30 tablet 0  . levothyroxine (SYNTHROID) 50 MCG tablet TAKE 1 TABLET BY MOUTH  DAILY BEFORE BREAKFAST 30 tablet 0  . metoprolol succinate (TOPROL XL) 25 MG 24 hr tablet Take 1 tablet (25 mg total) by mouth daily. 90 tablet 2  . mirabegron ER (MYRBETRIQ) 25 MG TB24 tablet Take 1 tablet (25 mg total) by mouth daily. 90 tablet 0  . nitroGLYCERIN (NITROSTAT) 0.4 MG SL tablet Place 1 tablet (0.4 mg total) under the tongue every 5 (five) minutes as needed. 90 tablet 3  . ondansetron (ZOFRAN-ODT) 4 MG disintegrating tablet Take 1 tablet (4 mg total) by mouth every 8 (eight) hours  as needed for nausea or vomiting. 20 tablet 0  . pantoprazole (PROTONIX) 40 MG tablet Take 1 tablet (40 mg total) by mouth daily. 90 tablet 3  . pravastatin (PRAVACHOL) 80 MG tablet TAKE 1 TABLET BY MOUTH  DAILY 90 tablet 3  . Tiotropium Bromide-Olodaterol (STIOLTO RESPIMAT) 2.5-2.5 MCG/ACT AERS Inhale 2 puffs into the lungs daily. 1 Inhaler 0  . torsemide (DEMADEX) 20 MG tablet Take 2 tablets (40 mg total) by mouth daily. 180 tablet 1  . triamcinolone cream (KENALOG) 0.1 % Apply 1 application topically 2 (two) times daily. 30 g 0  . zolpidem (AMBIEN) 10 MG tablet Take 10 mg by mouth at bedtime as needed for sleep.     No current facility-administered medications for this visit.   Allergies:  Patient has no known allergies.   Social History: The patient  reports that she has been smoking cigarettes. She started smoking about 21 months ago. She has a 25.00 pack-year smoking history. She has never used smokeless tobacco. She reports current alcohol use. She reports that she does not use drugs.   Family History: The patient's family history includes Alcohol abuse in her father and mother; Coronary artery disease in an other family member; Crohn's disease (age of onset: 33) in her sister; Hypertension in an other  family member; Stroke in her daughter.   ROS:  Please see the history of present illness. Otherwise, complete review of systems is positive for none.  All other systems are reviewed and negative.   Physical Exam: VS:  BP 136/62   Pulse 61   Ht 5\' 4"  (1.626 m)   Wt 242 lb (109.8 kg)   SpO2 97%   BMI 41.54 kg/m , BMI Body mass index is 41.54 kg/m.  Wt Readings from Last 3 Encounters:  09/30/20 242 lb (109.8 kg)  08/03/20 241 lb 6.4 oz (109.5 kg)  07/29/20 248 lb (112.5 kg)    General: Patient appears comfortable at rest. Neck: Supple, no elevated JVP or carotid bruits, no thyromegaly. Lungs: Clear to auscultation, nonlabored breathing at rest. Cardiac: Regular rate and rhythm, no S3 or significant systolic murmur, no pericardial rub. Extremities: No pitting edema, distal pulses 2+. Skin: Warm and dry. Musculoskeletal: No kyphosis. Neuropsychiatric: Alert and oriented x3, affect grossly appropriate.  ECG: 09/30/2020 EKG sinus bradycardia rate of 59, septal infarct, age undetermined.  Recent Labwork: 10/02/2019: Magnesium 1.6 10/15/2019: ALT 12; AST 21 06/30/2020: BUN 18; Creatinine, Ser 1.24; Hemoglobin 12.2; Platelets 172; Potassium 4.9; Sodium 143 08/03/2020: TSH 1.140     Component Value Date/Time   CHOL 142 08/19/2017 1505   TRIG 154 (H) 08/19/2017 1505   HDL 45 08/19/2017 1505   CHOLHDL 3.2 08/19/2017 1505   LDLCALC 66 08/19/2017 1505    Other Studies Reviewed Today:  Cardiac Monitor 07/20/2020 Preliminary Findings Patient had a min HR of 48 bpm, max HR of 156 bpm, and avg HR of 61 bpm. Predominant underlying rhythm was Sinus Rhythm.First Degree AV Block was present. 1 run of Ventricular Tachycardia occurred lasting 11 beats with a max rate of 128 bpm (avg 114 bpm). 13 Supraventricular Tachycardia runs occurred, the run with the fastest interval lasting 5 beats with a max rate of 156 bpm, the longest lasting 8 beats with an avg rate of 106 bpm. Isolated SVEs were rare  (<1.0%), SVE Couplets were rare (<1.0%), and SVE Triplets were rare (<1.0%). Isolated VEs were rare (<1.0%, 8575), VE Couplets were rare (<1.0%, 99), and VE Triplets were rare (<1.0%,  6). Ventricular Trigeminy was present.   06/14/20 echo IMPRESSIONS  1. Abnormal septal motion distal septal apical mid and basal inferior  wall hypokinesis . Left ventricular ejection fraction, by estimation, is  45 to 50%. The left ventricle has mildly decreased function. The left  ventricle has no regional wall motion  abnormalities. The left ventricular internal cavity size was mildly  dilated. There is moderate left ventricular hypertrophy. Left ventricular  diastolic parameters are consistent with Grade I diastolic dysfunction  (impaired relaxation).  2. Right ventricular systolic function is normal. The right ventricular  size is normal.  3. Left atrial size was mildly dilated.  4. The mitral valve is degenerative. Mild mitral valve regurgitation. No  evidence of mitral stenosis.  5. The aortic valve is tricuspid. Aortic valve regurgitation is not  visualized. Mild aortic valve sclerosis is present, with no evidence of  aortic valve stenosis.  6. The inferior vena cava is normal in size with greater than 50%  respiratory variability, suggesting right atrial pressure of 3 mmHg.   FINDINGS  Left Ventricle: Abnormal septal motion distal septal apical mid and basal  inferior wall hypokinesis. Left ventricular ejection fraction, by  estimation, is 45 to 50%. The left ventricle has mildly decreased  function. The left ventricle has no regional  wall motion abnormalities. The left ventricular internal cavity size was  mildly dilated. There is moderate left ventricular hypertrophy. Left  ventricular diastolic parameters are consistent with Grade I diastolic  dysfunction (impaired relaxation).   Right Ventricle: The right ventricular size is normal. No increase in  right ventricular wall  thickness. Right ventricular systolic function is  normal.   Left Atrium: Left atrial size was mildly dilated.   Right Atrium: Right atrial size was normal in size.   Pericardium: There is no evidence of pericardial effusion.   Mitral Valve: The mitral valve is degenerative in appearance. There is  mild thickening of the mitral valve leaflet(s). There is mild  calcification of the mitral valve leaflet(s). Mild mitral annular  calcification. Mild mitral valve regurgitation. No  evidence of mitral valve stenosis.   Tricuspid Valve: The tricuspid valve is normal in structure. Tricuspid  valve regurgitation is mild . No evidence of tricuspid stenosis.   Aortic Valve: The aortic valve is tricuspid. Aortic valve regurgitation is  not visualized. Mild aortic valve sclerosis is present, with no evidence  of aortic valve stenosis.   Pulmonic Valve: The pulmonic valve was normal in structure. Pulmonic valve  regurgitation is not visualized. No evidence of pulmonic stenosis.   Aorta: The aortic root is normal in size and structure.   Venous: The inferior vena cava is normal in size with greater than 50%  respiratory variability, suggesting right atrial pressure of 3 mmHg.   IAS/Shunts: No atrial level shunt detected by color flow Doppler.    Echocardiogram 06/15/2019([facility]): SUMMARY The left ventricular size is normal. Mild left ventricular hypertrophy LV ejection fraction = 55-60%. Left ventricular systolic function is normal. No segmental wall motion abnormalities seen in the left ventricle The right ventricle is normal in size and function. The left atrium is mildly dilated. There is no significant valvular stenosis or regurgitation. There was insufficient TR detected to calculate RV systolic pressure. There is no pericardial effusion. The inferior vena cava was not visualized during the exam. Probably no significant change in comparison with the prior study noted  Carotid  Dopplers 10/28/2019: Summary:  Right Carotid: Velocities in the right ICA are consistent with a  40-59%         stenosis. The ECA appears >50% stenosed.   Left Carotid: Velocities in the left ICA are consistent with a 1-39%  stenosis.        The ECA appears >50% stenosed.   Vertebrals: Bilateral vertebral arteries demonstrate antegrade flow.  Atypical        right vertebral artery waveform.  Subclavians: Normal flow hemodynamics were seen in bilateral subclavian        arteries.   Assessment and Plan:  1. Chronic combined systolic (congestive) and diastolic (congestive) heart failure ([place])   2. CAD in native artery   3. Near syncope    1. Chronic combined systolic (congestive) and diastolic (congestive) heart failure ([facility]) Recent echocardiogram 06/04/2020 demonstrated abnormal septal motion distal septal apical mid and basal inferior wall hypokinesis.  EF by estimation 45 to 50%.  No WMA's.  LV internal cavity size mildly dilated.  Moderate LVH.  G1 DD.  Mild MR.  Continue Toprol-XL 25 mg daily.  Continue torsemide 40 mg daily.  Continue Imdur 60 mg daily.  Continues with shortness of breath on exertion.  2. CAD in native artery Denies anginal symptoms.  Continue aspirin 81 mg daily.  Plavix 75 mg daily, Imdur 60 mg daily, Toprol-XL 25 mg daily, nitroglycerin sublingual as needed.  Pravastatin 80 mg daily.  3. Near syncope Previous complaints of near syncope with monitor results above.  She had 1 nonsustained ventricular tachycardia episode.  She had 13 SVT runs.  The run with the fastest interval was 5 beats with a maximum rate of 156.  The longest lasting 8 beats with an average of 106.  Predominant underlying rhythm was sinus with first-degree AV block.  Her daughter states she has had several TIAs and is wondering if this may be contributing.  Currently denies any syncope or near syncope.  States she has some balance issues but no dizziness, lightheadedness,  near syncopal or syncopal episodes.   Medication Adjustments/Labs and Tests Ordered: Current medicines are reviewed at length with the patient today.  Concerns regarding medicines are outlined above.   Disposition: Follow-up with Dr. Domenic Polite or APP 6 months  Signed, Levell July, NP 09/30/2020 11:36 AM    [facility] [place], [place], [place] [address] Phone: [phone]; Fax: [phone]

## 2020-09-30 ENCOUNTER — Ambulatory Visit: Payer: Medicare Other | Admitting: Family Medicine

## 2020-09-30 ENCOUNTER — Other Ambulatory Visit: Payer: Self-pay

## 2020-09-30 ENCOUNTER — Encounter: Payer: Self-pay | Admitting: Family Medicine

## 2020-09-30 VITALS — BP 136/62 | HR 61 | Ht 64.0 in | Wt 242.0 lb

## 2020-09-30 DIAGNOSIS — I5042 Chronic combined systolic (congestive) and diastolic (congestive) heart failure: Secondary | ICD-10-CM

## 2020-09-30 DIAGNOSIS — Z5189 Encounter for other specified aftercare: Secondary | ICD-10-CM | POA: Diagnosis not present

## 2020-09-30 DIAGNOSIS — R55 Syncope and collapse: Secondary | ICD-10-CM | POA: Diagnosis not present

## 2020-09-30 DIAGNOSIS — I2510 Atherosclerotic heart disease of native coronary artery without angina pectoris: Secondary | ICD-10-CM

## 2020-09-30 NOTE — Patient Instructions (Addendum)

## 2020-10-05 ENCOUNTER — Other Ambulatory Visit: Payer: Self-pay

## 2020-10-05 ENCOUNTER — Ambulatory Visit (INDEPENDENT_AMBULATORY_CARE_PROVIDER_SITE_OTHER): Payer: Medicare Other | Admitting: Nurse Practitioner

## 2020-10-05 ENCOUNTER — Encounter: Payer: Self-pay | Admitting: Nurse Practitioner

## 2020-10-05 VITALS — BP 118/61 | HR 71 | Temp 97.9°F | Resp 22 | Ht 63.0 in | Wt 239.0 lb

## 2020-10-05 DIAGNOSIS — S161XXD Strain of muscle, fascia and tendon at neck level, subsequent encounter: Secondary | ICD-10-CM | POA: Diagnosis not present

## 2020-10-05 MED ORDER — TIZANIDINE HCL 4 MG PO TABS: 4.0000 mg | ORAL_TABLET | Freq: Four times a day (QID) | ORAL | 0 refills | Status: DC | PRN

## 2020-10-05 MED ORDER — IBUPROFEN 600 MG PO TABS: 600.0000 mg | ORAL_TABLET | Freq: Three times a day (TID) | ORAL | 0 refills | Status: DC | PRN

## 2020-10-05 MED ORDER — TRAMADOL HCL 50 MG PO TABS: 50.0000 mg | ORAL_TABLET | Freq: Three times a day (TID) | ORAL | 0 refills | Status: AC | PRN

## 2020-10-05 NOTE — Progress Notes (Signed)
Acute Office Visit  Subjective:    Patient ID: [name], female    DOB: 1945-05-28, 75 y.o.   MRN: [id number]  Chief Complaint  Patient presents with  . Neck Pain    X 2 weeks ago, neck was stiff and could not move.     HPI Patient is in today for ED follow-up.  EDP on 09/27/20 said, "[name] is a 75 y.o. female.  She has a history of diabetes CHF CAD.  About a week ago she noticed some numbness on her right hand.  The next morning when she woke up she had severe posterior neck pain.  Worse with movement.  She feels 2 kn at the base of her skull and it helps when she pushes on those.  The pain sometimes wakes her up at night.  Her numbness is gone away and there is no weakness in the arms or legs.  She saw orthopedics today and they referred her here for imaging.  There was no trauma.  She has had prior neck surgery 30 years ago.  No bowel or bladder incontinence.  The history is provided by the patient.  Neck Injury This is a new problem. The current episode started more than 1 week ago. The problem occurs constantly. The problem has not changed since onset.Pertinent negatives include no chest pain, no abdominal pain, no headaches and no shortness of breath. The symptoms are aggravated by twisting and bending. Nothing relieves the symptoms. She has tried rest for the symptoms. The treatment provided no relief.  ...ordered imaging studies which included CT and MRI of cervical spine and I independently    visualized and interpreted imaging which showed no acute fractures.  Does have some canal stenosis although no gross impingement, acute right paracervical muscle edema consistent with injury Additional history obtained from patient's daughter"  Today, she states that her pain is 7-8/10 and it kept her awake at night. She states that she has not been taking any medicine for this at home except for a few hydrocodone that she was prescribed.    Past Medical History:   Diagnosis Date  . Acute ischemic colitis ([place]) 09/11/2005  . Anxiety   . Arthritis   . Atherosclerotic vascular disease    Calcified plaque at the origin of the celiac and SMA  . CHF (congestive heart failure) ([place])   . Coronary atherosclerosis of native coronary artery    Mulitvessel LVEF 50-50%, DES circ 1/11, occluded SVG to OM and SVG to diagonal , LVEF 60%  . Depression   . Diverticulosis of colon   . Essential hypertension   . Hypothyroidism   . Mesenteric ischemia ([place])   . Mitral regurgitation    Mild to moderate  . Mixed hyperlipidemia   . Myocardial infarction ([place]) 1990  . Obesity   . Orthostatic hypotension   . PAD (peripheral artery disease) ([place])   . PVC's (premature ventricular contractions)   . Schizophrenia ([place])   . Sleep apnea    CPAP  . Tubular adenoma   . Type 2 diabetes mellitus ([place])     Past Surgical History:  Procedure Laterality Date  . BACK SURGERY     Lumbar spine surgery  . Carotid endarectomy Bilateral   . CARPAL TUNNEL RELEASE     Bilateral  . CATARACT EXTRACTION W/PHACO Right 08/24/2013   Procedure: CATARACT EXTRACTION PHACO AND INTRAOCULAR LENS PLACEMENT (IOC);  Surgeon: Tonny Branch, MD;  Location: [facility];  Service: Ophthalmology;  Laterality: Right;  CDE 8.68  . COLONOSCOPY  09/14/2005   Dr. Leonard Schwartz colitis  . COLONOSCOPY WITH ESOPHAGOGASTRODUODENOSCOPY (EGD)  04/14/2012   Dr. Gala Romney- EGD= gastric erosions of doubtful clinical significance per bx- chronic inactive gastritis, benign small bowel mucosa. TCS=colonic diverticulosis, tubular adenoma  . CORONARY ARTERY BYPASS GRAFT     LIMA-LAD; SVG-OM; SVG-DX in 1995 [place]  . NECK SURGERY     Cervical laminectomy  . PARTIAL HYSTERECTOMY    . stents x4      Family History  Problem Relation Age of Onset  . Alcohol abuse Mother   . Alcohol abuse Father   . Coronary artery disease Other   . Hypertension Other   . Crohn's disease Sister 73  . Stroke Daughter     Social History    Socioeconomic History  . Marital status: Widowed    Spouse name: Not on file  . Number of children: 4  . Years of education: Not on file  . Highest education level: Not on file  Occupational History  . Occupation: DISABLED    Employer: UNEMPLOYED  Tobacco Use  . Smoking status: Light Tobacco Smoker    Packs/day: 0.50    Years: 50.00    Pack years: 25.00    Types: Cigarettes    Start date: 12/30/2018  . Smokeless tobacco: Never Used  Vaping Use  . Vaping Use: Never used  Substance and Sexual Activity  . Alcohol use: Yes    Alcohol/week: 0.0 standard drinks    Comment: occasionally   . Drug use: No  . Sexual activity: Not on file  Other Topics Concern  . Not on file  Social History Narrative   LIves alone   Social Determinants of Health   Financial Resource Strain: Not on file  Food Insecurity: Not on file  Transportation Needs: Not on file  Physical Activity: Not on file  Stress: Not on file  Social Connections: Not on file  Intimate Partner Violence: Not on file    Outpatient Medications Prior to Visit  Medication Sig Dispense Refill  . albuterol (VENTOLIN HFA) 108 (90 Base) MCG/ACT inhaler Inhale 2 puffs into the lungs every 6 (six) hours as needed for wheezing or shortness of breath. 8 g 0  . ALPRAZolam (XANAX) 0.5 MG tablet Take 1 tablet (0.5 mg total) by mouth 2 (two) times daily as needed for anxiety. 60 tablet 0  . aspirin EC 81 MG tablet Take 81 mg by mouth daily.    Marland Kitchen buPROPion (WELLBUTRIN XL) 150 MG 24 hr tablet TAKE 1 TABLET BY MOUTH IN  THE MORNING AND AT BEDTIME 180 tablet 0  . clopidogrel (PLAVIX) 75 MG tablet Take 75 mg by mouth daily.    . fluticasone (FLOVENT HFA) 110 MCG/ACT inhaler Inhale 1 puff into the lungs in the morning and at bedtime. 1 each 12  . gabapentin (NEURONTIN) 300 MG capsule Take 1 capsule (300 mg total) by mouth 2 (two) times daily. 180 capsule 0  . isosorbide mononitrate (IMDUR) 60 MG 24 hr tablet Take 1 tablet (60 mg total) by  mouth daily. 30 tablet 0  . levothyroxine (SYNTHROID) 50 MCG tablet TAKE 1 TABLET BY MOUTH  DAILY BEFORE BREAKFAST 30 tablet 0  . metoprolol succinate (TOPROL XL) 25 MG 24 hr tablet Take 1 tablet (25 mg total) by mouth daily. 90 tablet 2  . mirabegron ER (MYRBETRIQ) 25 MG TB24 tablet Take 1 tablet (25 mg total) by mouth daily. 90 tablet 0  .  nitroGLYCERIN (NITROSTAT) 0.4 MG SL tablet Place 1 tablet (0.4 mg total) under the tongue every 5 (five) minutes as needed. 90 tablet 3  . ondansetron (ZOFRAN-ODT) 4 MG disintegrating tablet Take 1 tablet (4 mg total) by mouth every 8 (eight) hours as needed for nausea or vomiting. 20 tablet 0  . pantoprazole (PROTONIX) 40 MG tablet Take 1 tablet (40 mg total) by mouth daily. 90 tablet 3  . pravastatin (PRAVACHOL) 80 MG tablet TAKE 1 TABLET BY MOUTH  DAILY 90 tablet 3  . Tiotropium Bromide-Olodaterol (STIOLTO RESPIMAT) 2.5-2.5 MCG/ACT AERS Inhale 2 puffs into the lungs daily. 1 Inhaler 0  . torsemide (DEMADEX) 20 MG tablet Take 2 tablets (40 mg total) by mouth daily. 180 tablet 1  . triamcinolone cream (KENALOG) 0.1 % Apply 1 application topically 2 (two) times daily. 30 g 0  . zolpidem (AMBIEN) 10 MG tablet Take 10 mg by mouth at bedtime as needed for sleep.     No facility-administered medications prior to visit.    No Known Allergies  Review of Systems  Constitutional: Negative.   Musculoskeletal: Positive for neck pain.       Objective:    Physical Exam Constitutional:      Appearance: Normal appearance.  Musculoskeletal:        General: Swelling and tenderness present.     Comments: To neck; pain with neck ROM exercises  Neurological:     Mental Status: She is alert.     BP 118/61   Pulse 71   Temp 97.9 F (36.6 C)   Resp (!) 22   Ht 5\' 3"  (1.6 m)   Wt 239 lb (108.4 kg)   SpO2 92%   BMI 42.34 kg/m  Wt Readings from Last 3 Encounters:  10/05/20 239 lb (108.4 kg)  09/30/20 242 lb (109.8 kg)  08/03/20 241 lb 6.4 oz (109.5 kg)     Health Maintenance Due  Topic Date Due  . Pneumococcal Vaccine 21-87 Years old (1 of 4 - PCV13) Never done  . OPHTHALMOLOGY EXAM  Never done  . Zoster Vaccines- Shingrix (1 of 2) Never done  . DEXA SCAN  Never done  . PNA vac Low Risk Adult (1 of 2 - PCV13) Never done  . COVID-19 Vaccine (3 - Booster for Moderna series) 07/09/2020  . URINE MICROALBUMIN  10/14/2020    There are no preventive care reminders to display for this patient.   Lab Results  Component Value Date   TSH 1.140 08/03/2020   Lab Results  Component Value Date   WBC 5.2 06/30/2020   HGB 12.2 06/30/2020   HCT 37.2 06/30/2020   MCV 94 06/30/2020   PLT 172 06/30/2020   Lab Results  Component Value Date   NA 143 06/30/2020   K 4.9 06/30/2020   CO2 23 06/30/2020   GLUCOSE 128 (H) 06/30/2020   BUN 18 06/30/2020   CREATININE 1.24 (H) 06/30/2020   BILITOT 0.3 10/15/2019   ALKPHOS 59 09/30/2019   AST 21 10/15/2019   ALT 12 10/15/2019   PROT 5.8 (L) 10/15/2019   ALBUMIN 2.2 (L) 09/30/2019   CALCIUM 9.4 06/30/2020   ANIONGAP 10 06/14/2020   EGFR 45 (L) 06/30/2020   Lab Results  Component Value Date   CHOL 142 08/19/2017   Lab Results  Component Value Date   HDL 45 08/19/2017   Lab Results  Component Value Date   LDLCALC 66 08/19/2017   Lab Results  Component Value Date  TRIG 154 (H) 08/19/2017   Lab Results  Component Value Date   CHOLHDL 3.2 08/19/2017   Lab Results  Component Value Date   HGBA1C 6.7 (H) 08/03/2020       Assessment & Plan:   Problem List Items Addressed This Visit      Musculoskeletal and Integument   Neck muscle strain - Primary    -MRI showed neck muslce strain at ED -she is still in pain -Rx. Tizanidine -Rx. Tramadol -Rx. Ibuprofen, she is on ASA and plavix, so we discussed that this can increase her bleeding risk so she should only take this if pain is severe and for a short term of about 5 days; stop if dark stools develop      Relevant Medications    tiZANidine (ZANAFLEX) 4 MG tablet   ibuprofen (ADVIL) 600 MG tablet   traMADol (ULTRAM) 50 MG tablet       Meds ordered this encounter  Medications  . tiZANidine (ZANAFLEX) 4 MG tablet    Sig: Take 1 tablet (4 mg total) by mouth every 6 (six) hours as needed for muscle spasms.    Dispense:  30 tablet    Refill:  0  . ibuprofen (ADVIL) 600 MG tablet    Sig: Take 1 tablet (600 mg total) by mouth every 8 (eight) hours as needed for headache, mild pain or moderate pain.    Dispense:  15 tablet    Refill:  0  . traMADol (ULTRAM) 50 MG tablet    Sig: Take 1 tablet (50 mg total) by mouth every 8 (eight) hours as needed for up to 5 days.    Dispense:  15 tablet    Refill:  0     Noreene Larsson, NP

## 2020-10-05 NOTE — Assessment & Plan Note (Signed)
-  MRI showed neck muslce strain at ED -she is still in pain -Rx. Tizanidine -Rx. Tramadol -Rx. Ibuprofen, she is on ASA and plavix, so we discussed that this can increase her bleeding risk so she should only take this if pain is severe and for a short term of about 5 days; stop if dark stools develop

## 2020-10-06 ENCOUNTER — Other Ambulatory Visit: Payer: Self-pay | Admitting: Internal Medicine

## 2020-10-06 DIAGNOSIS — J449 Chronic obstructive pulmonary disease, unspecified: Secondary | ICD-10-CM

## 2020-10-06 DIAGNOSIS — L309 Dermatitis, unspecified: Secondary | ICD-10-CM

## 2020-10-06 DIAGNOSIS — E039 Hypothyroidism, unspecified: Secondary | ICD-10-CM

## 2020-10-07 ENCOUNTER — Telehealth: Payer: Self-pay

## 2020-10-07 NOTE — Telephone Encounter (Signed)
Pt will need to do a virtual visit to discuss med changed

## 2020-10-07 NOTE — Telephone Encounter (Signed)
Virtual appointment scheduled. 

## 2020-10-07 NOTE — Telephone Encounter (Signed)
Patient granddauther called needs med refill requestin 90 day supply instead of 30 day she takes 2 a day and does not have enough to do her for the month.   Please confirm how many patient should be taking per day.  buPROPion (WELLBUTRIN XL) 150 MG 24 hr tablet   To be sent into [facility]

## 2020-10-10 ENCOUNTER — Telehealth: Payer: Self-pay | Admitting: Family Medicine

## 2020-10-10 MED ORDER — METOPROLOL SUCCINATE ER 25 MG PO TB24: 25.0000 mg | ORAL_TABLET | Freq: Every day | ORAL | 2 refills | Status: DC

## 2020-10-10 MED ORDER — ISOSORBIDE MONONITRATE ER 60 MG PO TB24: 60.0000 mg | ORAL_TABLET | Freq: Every day | ORAL | 2 refills | Status: DC

## 2020-10-10 MED ORDER — TORSEMIDE 20 MG PO TABS: 40.0000 mg | ORAL_TABLET | Freq: Every day | ORAL | 2 refills | Status: DC

## 2020-10-10 MED ORDER — CLOPIDOGREL BISULFATE 75 MG PO TABS: 75.0000 mg | ORAL_TABLET | Freq: Every day | ORAL | 2 refills | Status: DC

## 2020-10-10 NOTE — Telephone Encounter (Signed)
   1. Which medications need to be refilled? (please list name of each medication and dose if known) clopidogrel (PLAVIX) 75 MG tablet  2. Which pharmacy/location (including street and city if local pharmacy) is medication to be sent to?[facility]   3. Do they need a 30 day or 90 day supply? [place]

## 2020-10-10 NOTE — Telephone Encounter (Signed)
Granddaughter called back stating that all of patients heart medications need to be refilled and sent to [facility]

## 2020-10-10 NOTE — Telephone Encounter (Signed)
Refills sent

## 2020-10-12 ENCOUNTER — Other Ambulatory Visit: Payer: Self-pay

## 2020-10-12 ENCOUNTER — Encounter: Payer: Medicare Other | Admitting: Internal Medicine

## 2020-10-12 ENCOUNTER — Encounter: Payer: Self-pay | Admitting: Internal Medicine

## 2020-10-13 NOTE — Progress Notes (Signed)
Could not reach patient despite calling multiple times.

## 2020-10-19 ENCOUNTER — Telehealth: Payer: Medicare Other | Admitting: Nurse Practitioner

## 2020-10-20 ENCOUNTER — Encounter: Payer: Self-pay | Admitting: Nurse Practitioner

## 2020-10-20 ENCOUNTER — Other Ambulatory Visit: Payer: Self-pay

## 2020-10-20 ENCOUNTER — Telehealth (INDEPENDENT_AMBULATORY_CARE_PROVIDER_SITE_OTHER): Payer: Medicare Other | Admitting: Nurse Practitioner

## 2020-10-20 DIAGNOSIS — R42 Dizziness and giddiness: Secondary | ICD-10-CM | POA: Diagnosis not present

## 2020-10-20 DIAGNOSIS — Z720 Tobacco use: Secondary | ICD-10-CM | POA: Diagnosis not present

## 2020-10-20 DIAGNOSIS — R1084 Generalized abdominal pain: Secondary | ICD-10-CM | POA: Diagnosis not present

## 2020-10-20 DIAGNOSIS — R109 Unspecified abdominal pain: Secondary | ICD-10-CM | POA: Insufficient documentation

## 2020-10-20 MED ORDER — SULFAMETHOXAZOLE-TRIMETHOPRIM 800-160 MG PO TABS: 1.0000 | ORAL_TABLET | Freq: Two times a day (BID) | ORAL | 0 refills | Status: DC

## 2020-10-20 NOTE — Progress Notes (Addendum)
Acute Office Visit  Subjective:    Patient ID: [name], female    DOB: October 31, 1945, 75 y.o.   MRN: [id number]  Chief Complaint  Patient presents with   Medication Problem    Has been off her Welbutrin for a week now, is feeling ok from that.    Abdominal Pain    Ongoing x4-5 days    HPI Patient is in today for med check.  She weaned herself of gabapentin and wellbutrin, because she was was getting dizzy and having fatigue.  She started wellbutrin to stop smoking, and she states she smokes cigarettes occasionally, but that is rare.  She states that her pain comes and goes. She is in no pain at the moment. She states it is severe when it occurs. She has hx of mesenteric ischemia, but her pain does not feel similar to that.  She denies changes in bowel patterns or blood in her stool.    Past Medical History:  Diagnosis Date   Acute ischemic colitis ([place]) 09/11/2005   Anxiety    Arthritis    Atherosclerotic vascular disease    Calcified plaque at the origin of the celiac and SMA   CHF (congestive heart failure) ([facility])    Coronary atherosclerosis of native coronary artery    Mulitvessel LVEF 50-50%, DES circ 1/11, occluded SVG to OM and SVG to diagonal , LVEF 60%   Depression    Diverticulosis of colon    Essential hypertension    Hypothyroidism    Mesenteric ischemia ([facility])    Mitral regurgitation    Mild to moderate   Mixed hyperlipidemia    Myocardial infarction ([place]) 1990   Obesity    Orthostatic hypotension    PAD (peripheral artery disease) ([facility])    PVC's (premature ventricular contractions)    Schizophrenia ([facility])    Sleep apnea    CPAP   Tubular adenoma    Type 2 diabetes mellitus ([place])     Past Surgical History:  Procedure Laterality Date   BACK SURGERY     Lumbar spine surgery   Carotid endarectomy Bilateral    CARPAL TUNNEL RELEASE     Bilateral   CATARACT EXTRACTION W/PHACO Right 08/24/2013   Procedure: CATARACT EXTRACTION PHACO AND  INTRAOCULAR LENS PLACEMENT ([place]);  Surgeon: Tonny Branch, MD;  Location: [facility];  Service: Ophthalmology;  Laterality: Right;  CDE 8.68   COLONOSCOPY  09/14/2005   Dr. Leonard Schwartz colitis   COLONOSCOPY WITH ESOPHAGOGASTRODUODENOSCOPY (EGD)  04/14/2012   Dr. Gala Romney- EGD= gastric erosions of doubtful clinical significance per bx- chronic inactive gastritis, benign small bowel mucosa. TCS=colonic diverticulosis, tubular adenoma   CORONARY ARTERY BYPASS GRAFT     LIMA-LAD; SVG-OM; SVG-DX in [place]     Cervical laminectomy   PARTIAL HYSTERECTOMY     stents x4      Family History  Problem Relation Age of Onset   Alcohol abuse Mother    Alcohol abuse Father    Coronary artery disease Other    Hypertension Other    Crohn's disease Sister 30   Stroke Daughter     Social History   Socioeconomic History   Marital status: Widowed    Spouse name: Not on file   Number of children: 4   Years of education: Not on file   Highest education level: Not on file  Occupational History   Occupation: DISABLED    Employer: UNEMPLOYED  Tobacco Use   Smoking status:  Light Smoker    Packs/day: 0.50    Years: 50.00    Pack years: 25.00    Types: Cigarettes    Start date: 12/30/2018   Smokeless tobacco: Never  Vaping Use   Vaping Use: Never used  Substance and Sexual Activity   Alcohol use: Yes    Alcohol/week: 0.0 standard drinks    Comment: occasionally    Drug use: No   Sexual activity: Not on file  Other Topics Concern   Not on file  Social History Narrative   LIves alone   Social Determinants of Health   Financial Resource Strain: Not on file  Food Insecurity: Not on file  Transportation Needs: Not on file  Physical Activity: Not on file  Stress: Not on file  Social Connections: Not on file  Intimate Partner Violence: Not on file    Outpatient Medications Prior to Visit  Medication Sig Dispense Refill   albuterol (VENTOLIN HFA) 108 (90 Base) MCG/ACT inhaler  USE 2 INHALATIONS BY MOUTH  EVERY 6 HOURS AS NEEDED FOR WHEEZING OR SHORTNESS OF  BREATH 18 g 0   ALPRAZolam (XANAX) 0.5 MG tablet Take 1 tablet (0.5 mg total) by mouth 2 (two) times daily as needed for anxiety. 60 tablet 0   aspirin EC 81 MG tablet Take 81 mg by mouth daily.     clopidogrel (PLAVIX) 75 MG tablet Take 1 tablet (75 mg total) by mouth daily. 90 tablet 2   fluticasone (FLOVENT HFA) 110 MCG/ACT inhaler Inhale 1 puff into the lungs in the morning and at bedtime. 1 each 12   isosorbide mononitrate (IMDUR) 60 MG 24 hr tablet Take 1 tablet (60 mg total) by mouth daily. 90 tablet 2   levothyroxine (SYNTHROID) 50 MCG tablet TAKE 1 TABLET BY MOUTH  DAILY BEFORE BREAKFAST 30 tablet 11   metoprolol succinate (TOPROL XL) 25 MG 24 hr tablet Take 1 tablet (25 mg total) by mouth daily. 90 tablet 2   mirabegron ER (MYRBETRIQ) 25 MG TB24 tablet Take 1 tablet (25 mg total) by mouth daily. 90 tablet 0   nitroGLYCERIN (NITROSTAT) 0.4 MG SL tablet Place 1 tablet (0.4 mg total) under the tongue every 5 (five) minutes as needed. 90 tablet 3   pantoprazole (PROTONIX) 40 MG tablet Take 1 tablet (40 mg total) by mouth daily. 90 tablet 3   pravastatin (PRAVACHOL) 80 MG tablet TAKE 1 TABLET BY MOUTH  DAILY 90 tablet 3   Tiotropium Bromide-Olodaterol (STIOLTO RESPIMAT) 2.5-2.5 MCG/ACT AERS Inhale 2 puffs into the lungs daily. 1 Inhaler 0   tiZANidine (ZANAFLEX) 4 MG tablet Take 1 tablet (4 mg total) by mouth every 6 (six) hours as needed for muscle spasms. 30 tablet 0   torsemide (DEMADEX) 20 MG tablet Take 2 tablets (40 mg total) by mouth daily. 180 tablet 2   triamcinolone cream (KENALOG) 0.1 % APPLY TOPICALLY TWICE DAILY 30 g 0   zolpidem (AMBIEN) 10 MG tablet Take 10 mg by mouth at bedtime as needed for sleep.     buPROPion (WELLBUTRIN XL) 150 MG 24 hr tablet TAKE 1 TABLET BY MOUTH IN  THE MORNING AND AT BEDTIME 180 tablet 0   gabapentin (NEURONTIN) 300 MG capsule Take 1 capsule (300 mg total) by mouth 2  (two) times daily. (Patient not taking: Reported on 10/20/2020) 180 capsule 0   No facility-administered medications prior to visit.    No Known Allergies  Review of Systems  Constitutional: Negative.   Respiratory: Negative.  Cardiovascular: Negative.   Gastrointestinal:  Positive for abdominal pain. Negative for constipation, diarrhea, nausea and vomiting.      Objective:    Physical Exam  There were no vitals taken for this visit. Wt Readings from Last 3 Encounters:  10/12/20 243 lb (110.2 kg)  10/05/20 239 lb (108.4 kg)  09/30/20 242 lb (109.8 kg)    Health Maintenance Due  Topic Date Due   OPHTHALMOLOGY EXAM  Never done   DEXA SCAN  Never done   URINE MICROALBUMIN  10/14/2020    There are no preventive care reminders to display for this patient.   Lab Results  Component Value Date   TSH 1.140 08/03/2020   Lab Results  Component Value Date   WBC 5.2 06/30/2020   HGB 12.2 06/30/2020   HCT 37.2 06/30/2020   MCV 94 06/30/2020   PLT 172 06/30/2020   Lab Results  Component Value Date   NA 143 06/30/2020   K 4.9 06/30/2020   CO2 23 06/30/2020   GLUCOSE 128 (H) 06/30/2020   BUN 18 06/30/2020   CREATININE 1.24 (H) 06/30/2020   BILITOT 0.3 10/15/2019   ALKPHOS 59 09/30/2019   AST 21 10/15/2019   ALT 12 10/15/2019   PROT 5.8 (L) 10/15/2019   ALBUMIN 2.2 (L) 09/30/2019   CALCIUM 9.4 06/30/2020   ANIONGAP 10 06/14/2020   EGFR 45 (L) 06/30/2020   Lab Results  Component Value Date   CHOL 142 08/19/2017   Lab Results  Component Value Date   HDL 45 08/19/2017   Lab Results  Component Value Date   LDLCALC 66 08/19/2017   Lab Results  Component Value Date   TRIG 154 (H) 08/19/2017   Lab Results  Component Value Date   CHOLHDL 3.2 08/19/2017   Lab Results  Component Value Date   HGBA1C 6.7 (H) 08/03/2020       Assessment & Plan:   Problem List Items Addressed This Visit       Other   Tobacco abuse    -she states she was started on  wellbutrin for smoking cessation -she is still smoking and stopped wellbutrin       Dizziness    -she stopped her wellbutrin and gabapentin d/t dizziness and fatigue       Abdominal pain - Primary    -no pain currently -pt's chief complaint was a medication issue, but she changed her mind in the middle of the call... televisit isn't appropriate for abdominal pain -pain is sporadic; DDx. Cholecystitis, renal stone, ischemia (has hx), colitis -will get labs -explained that given her history if she has pain again that she should go to the emergency department for imaging -Rx. Bactrim -will see her in office in 1 week; if pain persists she should go to ED       Relevant Medications   sulfamethoxazole-trimethoprim (BACTRIM DS) 800-160 MG tablet   Other Relevant Orders   CBC with Differential/Platelet   CMP14+EGFR     Meds ordered this encounter  Medications   sulfamethoxazole-trimethoprim (BACTRIM DS) 800-160 MG tablet    Sig: Take 1 tablet by mouth 2 (two) times daily.    Dispense:  14 tablet    Refill:  0   Date:  10/26/2020   Location of Patient: Home Location of Provider: Office Consent was obtain for visit to be over via telehealth. I verified that I am speaking with the correct person using two identifiers.  I connected with  [name] on  10/20/20 via telephone and verified that I am speaking with the correct person using two identifiers.   I discussed the limitations of evaluation and management by telemedicine. The patient expressed understanding and agreed to proceed.  Time spent: 15 minutes   Noreene Larsson, NP

## 2020-10-20 NOTE — Assessment & Plan Note (Signed)
-  she stopped her wellbutrin and gabapentin d/t dizziness and fatigue

## 2020-10-20 NOTE — Patient Instructions (Signed)
Go to the emergency department if you have pain again, so they can complete rapid imaging.  Otherwise, I will see you in the office in 1 week.

## 2020-10-20 NOTE — Assessment & Plan Note (Signed)
-  she states she was started on wellbutrin for smoking cessation -she is still smoking and stopped wellbutrin

## 2020-10-20 NOTE — Assessment & Plan Note (Addendum)
-  no pain currently -pt's chief complaint was a medication issue, but she changed her mind in the middle of the call... televisit isn't appropriate for abdominal pain -pain is sporadic; DDx. Cholecystitis, renal stone, ischemia (has hx), colitis -will get labs -explained that given her history if she has pain again that she should go to the emergency department for imaging -Rx. Bactrim -will see her in office in 1 week; if pain persists she should go to ED

## 2020-10-27 ENCOUNTER — Ambulatory Visit: Payer: Medicare Other | Admitting: Internal Medicine

## 2020-11-01 ENCOUNTER — Other Ambulatory Visit: Payer: Self-pay | Admitting: Internal Medicine

## 2020-11-01 ENCOUNTER — Telehealth: Payer: Self-pay

## 2020-11-01 DIAGNOSIS — N3281 Overactive bladder: Secondary | ICD-10-CM

## 2020-11-01 DIAGNOSIS — F419 Anxiety disorder, unspecified: Secondary | ICD-10-CM

## 2020-11-01 NOTE — Telephone Encounter (Signed)
Patient granddaughter called said medicine has not been received yet at [facility] yet.  ALPRAZolam (XANAX) 0.5 MG tablet

## 2020-11-02 ENCOUNTER — Other Ambulatory Visit: Payer: Self-pay | Admitting: Nurse Practitioner

## 2020-11-02 DIAGNOSIS — F419 Anxiety disorder, unspecified: Secondary | ICD-10-CM

## 2020-11-02 MED ORDER — ALPRAZOLAM 0.5 MG PO TABS: 0.5000 mg | ORAL_TABLET | Freq: Two times a day (BID) | ORAL | 2 refills | Status: DC | PRN

## 2020-11-02 NOTE — Telephone Encounter (Signed)
sent 

## 2020-11-07 ENCOUNTER — Other Ambulatory Visit: Payer: Self-pay

## 2020-11-07 ENCOUNTER — Telehealth (INDEPENDENT_AMBULATORY_CARE_PROVIDER_SITE_OTHER): Payer: Medicare Other | Admitting: Family Medicine

## 2020-11-07 ENCOUNTER — Encounter: Payer: Self-pay | Admitting: Family Medicine

## 2020-11-07 DIAGNOSIS — U071 COVID-19: Secondary | ICD-10-CM | POA: Diagnosis not present

## 2020-11-07 HISTORY — DX: COVID-19: U07.1

## 2020-11-07 MED ORDER — NIRMATRELVIR/RITONAVIR (PAXLOVID) TABLET (RENAL DOSING): 1.0000 | ORAL_TABLET | Freq: Two times a day (BID) | ORAL | 0 refills | Status: AC

## 2020-11-07 NOTE — Patient Instructions (Signed)
Keep August appointment with Dr. Posey Pronto, call if you need a sooner.  Medication is prescribed for your COVID infection.  Ensure that you drink adequate fluid and get sufficient rest. Use tylenol for pain and Robitussin DM for cough  New should remain in quarantine for 1 week after your first testing positive for should be until July 15.  If you still have symptoms at that time you should still remain in quarantine.  Hope that you continue to improve, and that you recover fully soon   Thanks for choosing [facility], we consider it a privelige to serve you.

## 2020-11-07 NOTE — Assessment & Plan Note (Signed)
Reports positive home test on 11/03/2020 and still c/o headache, chills, body aches, has home oxygen, denies increased SOB Paxlovid prescribed with adjustment for renal impairment F/U with PCP, call if needs to be seen sooner

## 2020-11-07 NOTE — Progress Notes (Signed)
Virtual Visit via Telephone Note  I connected with [name] on 11/07/20 at  9:40 AM EDT by telephone and verified that I am speaking with the correct person using two identifiers.  Location: Patient: HOME Provider: OFFICE   I discussed the limitations, risks, security and privacy concerns of performing an evaluation and management service by telephone and the availability of in person appointments. I also discussed with the patient that there may be a patient responsible charge related to this service. The patient expressed understanding and agreed to proceed.   History of Present Illness: COVID POSITIVE ON 07/07 HAD TEMP UP TO 102, ONLY DAY SHE HAD A FEVER. HAVING HEADACHE , COUGH, BODY ACHES AND CHILLS, OCCASIONAL SPUTUM BNO N/V/D   Observations/Objective:  There were no vitals taken for this visit. Good communication with no confusion and intact memory. Alert and oriented x 3 No signs of respiratory distress during speech  Assessment and Plan: COVID-19 virus infection Reports positive home test on 11/03/2020 and still c/o headache, chills, body aches, has home oxygen, denies increased SOB Paxlovid prescribed with adjustment for renal impairment F/U with PCP, call if needs to be seen sooner   Follow Up Instructions:    I discussed the assessment and treatment plan with the patient. The patient was provided an opportunity to ask questions and all were answered. The patient agreed with the plan and demonstrated an understanding of the instructions.   The patient was advised to call back or seek an in-person evaluation if the symptoms worsen or if the condition fails to improve as anticipated.  I provided 8 minutes of non-face-to-face time during this encounter.   Tula Nakayama, MD

## 2020-11-08 ENCOUNTER — Telehealth: Payer: Self-pay | Admitting: *Deleted

## 2020-11-08 NOTE — Telephone Encounter (Signed)
FYI pt knows you are out of office until August. Would like labs ordered before 8-11 appointment. Let her know when you came back in August we would get these ordered and she could come and have them drawn before appointment.   Just wondering what labs you would like to order

## 2020-11-15 ENCOUNTER — Ambulatory Visit: Payer: Medicare Other | Admitting: Internal Medicine

## 2020-11-21 ENCOUNTER — Other Ambulatory Visit: Payer: Self-pay

## 2020-11-21 DIAGNOSIS — N1831 Chronic kidney disease, stage 3a: Secondary | ICD-10-CM

## 2020-11-21 NOTE — Progress Notes (Signed)
Labs ordered.

## 2020-11-21 NOTE — Telephone Encounter (Signed)
Lab order placed.

## 2020-11-22 ENCOUNTER — Telehealth: Payer: Self-pay

## 2020-11-22 NOTE — Telephone Encounter (Signed)
Returning call. Call back # [phone]

## 2020-11-22 NOTE — Telephone Encounter (Signed)
Kimberly notified labs to be drawn a week before next visit

## 2020-11-22 NOTE — Telephone Encounter (Signed)
NA NVM to let pt know labs have been ordered

## 2020-11-30 DIAGNOSIS — R1084 Generalized abdominal pain: Secondary | ICD-10-CM | POA: Diagnosis not present

## 2020-12-01 LAB — CMP14+EGFR
ALT: 9 IU/L (ref 0–32)
AST: 16 IU/L (ref 0–40)
Albumin/Globulin Ratio: 1.3 (ref 1.2–2.2)
Albumin: 3.9 g/dL (ref 3.7–4.7)
Alkaline Phosphatase: 130 IU/L — ABNORMAL HIGH (ref 44–121)
BUN/Creatinine Ratio: 13 (ref 12–28)
BUN: 15 mg/dL (ref 8–27)
Bilirubin Total: 0.2 mg/dL (ref 0.0–1.2)
CO2: 22 mmol/L (ref 20–29)
Calcium: 9.2 mg/dL (ref 8.7–10.3)
Chloride: 107 mmol/L — ABNORMAL HIGH (ref 96–106)
Creatinine, Ser: 1.13 mg/dL — ABNORMAL HIGH (ref 0.57–1.00)
Globulin, Total: 2.9 g/dL (ref 1.5–4.5)
Glucose: 131 mg/dL — ABNORMAL HIGH (ref 65–99)
Potassium: 5.2 mmol/L (ref 3.5–5.2)
Sodium: 144 mmol/L (ref 134–144)
Total Protein: 6.8 g/dL (ref 6.0–8.5)
eGFR: 50 mL/min/{1.73_m2} — ABNORMAL LOW (ref >59)

## 2020-12-01 LAB — CBC WITH DIFFERENTIAL/PLATELET
Basophils Absolute: 0 10*3/uL (ref 0.0–0.2)
Basos: 1 %
EOS (ABSOLUTE): 0.1 10*3/uL (ref 0.0–0.4)
Eos: 3 %
Hematocrit: 36.1 % (ref 34.0–46.6)
Hemoglobin: 11.8 g/dL (ref 11.1–15.9)
Immature Grans (Abs): 0 10*3/uL (ref 0.0–0.1)
Immature Granulocytes: 0 %
Lymphocytes Absolute: 1.5 10*3/uL (ref 0.7–3.1)
Lymphs: 35 %
MCH: 30.9 pg (ref 26.6–33.0)
MCHC: 32.7 g/dL (ref 31.5–35.7)
MCV: 95 fL (ref 79–97)
Monocytes Absolute: 0.6 10*3/uL (ref 0.1–0.9)
Monocytes: 13 %
Neutrophils Absolute: 2 10*3/uL (ref 1.4–7.0)
Neutrophils: 48 %
Platelets: 180 10*3/uL (ref 150–450)
RBC: 3.82 x10E6/uL (ref 3.77–5.28)
RDW: 13.7 % (ref 11.7–15.4)
WBC: 4.2 10*3/uL (ref 3.4–10.8)

## 2020-12-01 NOTE — Progress Notes (Signed)
You see her next on 12/08/20. Renal labs look stable, AP is slightly elevated.

## 2020-12-08 ENCOUNTER — Ambulatory Visit (INDEPENDENT_AMBULATORY_CARE_PROVIDER_SITE_OTHER): Payer: Medicare Other | Admitting: Internal Medicine

## 2020-12-08 ENCOUNTER — Other Ambulatory Visit: Payer: Self-pay

## 2020-12-08 ENCOUNTER — Encounter: Payer: Self-pay | Admitting: Internal Medicine

## 2020-12-08 VITALS — BP 143/74 | HR 71 | Temp 97.7°F | Resp 18 | Ht 63.0 in | Wt 239.0 lb

## 2020-12-08 DIAGNOSIS — E039 Hypothyroidism, unspecified: Secondary | ICD-10-CM | POA: Diagnosis not present

## 2020-12-08 DIAGNOSIS — I10 Essential (primary) hypertension: Secondary | ICD-10-CM

## 2020-12-08 DIAGNOSIS — E1169 Type 2 diabetes mellitus with other specified complication: Secondary | ICD-10-CM

## 2020-12-08 DIAGNOSIS — I739 Peripheral vascular disease, unspecified: Secondary | ICD-10-CM | POA: Diagnosis not present

## 2020-12-08 DIAGNOSIS — G4733 Obstructive sleep apnea (adult) (pediatric): Secondary | ICD-10-CM

## 2020-12-08 DIAGNOSIS — J449 Chronic obstructive pulmonary disease, unspecified: Secondary | ICD-10-CM

## 2020-12-08 DIAGNOSIS — N183 Chronic kidney disease, stage 3 unspecified: Secondary | ICD-10-CM

## 2020-12-08 DIAGNOSIS — F419 Anxiety disorder, unspecified: Secondary | ICD-10-CM

## 2020-12-08 MED ORDER — BUSPIRONE HCL 5 MG PO TABS: 5.0000 mg | ORAL_TABLET | Freq: Two times a day (BID) | ORAL | 2 refills | Status: DC

## 2020-12-08 NOTE — Patient Instructions (Signed)
Start taking Buspirone for anxiety.  You are being referred to Nephrology.  You are being referred to Pulmonology for sleep apnea.

## 2020-12-11 ENCOUNTER — Encounter: Payer: Self-pay | Admitting: Internal Medicine

## 2020-12-11 DIAGNOSIS — N1832 Chronic kidney disease, stage 3b: Secondary | ICD-10-CM | POA: Insufficient documentation

## 2020-12-11 DIAGNOSIS — N183 Chronic kidney disease, stage 3 unspecified: Secondary | ICD-10-CM | POA: Insufficient documentation

## 2020-12-11 NOTE — Assessment & Plan Note (Signed)
On DAPT and statin

## 2020-12-11 NOTE — Assessment & Plan Note (Signed)
Lab Results  Component Value Date   TSH 1.140 08/03/2020   On Levothyroxine 50 mcg recently Check TSH and free T4, adjust dose accordingly

## 2020-12-11 NOTE — Assessment & Plan Note (Signed)
Takes Xanax, sometimes twice daily Advised to decrease Xanax to once daily and started Buspar Patient also takes Ambien for insomnia.

## 2020-12-11 NOTE — Assessment & Plan Note (Signed)
BP Readings from Last 1 Encounters:  12/08/20 (!) 143/74   Elevated today Overall well-controlled considering her age 75 for compliance with the medications Advised DASH diet

## 2020-12-11 NOTE — Assessment & Plan Note (Addendum)
Well-controlled with Stiolto, Flovent and PRN Albuterol 

## 2020-12-11 NOTE — Assessment & Plan Note (Addendum)
  Lab Results  Component Value Date   HGBA1C 6.7 (H) 08/03/2020    Diet controlled Avoid tighter control in her case due to risk of hypoglycemia related fall

## 2020-12-11 NOTE — Progress Notes (Signed)
Established Patient Office Visit  Subjective:  Patient ID: [name], female    DOB: [birth year]  Age: 75 y.o. MRN: [id number]  CC:  Chief Complaint  Patient presents with   Follow-up    4 month follow up HTN CHF and Hypothyroidism she is feeling fatigued and sob all the time     HPI [name]  is a 75 y.o. female with past medical history of hypertension, hyperlipidemia, mesenteric ischemia status post right hemicolectomy, coronary artery disease, hypothyroidism, type 2 diabetes mellitus, depression with anxiety, obesity, arthritis, chronic back pain who presents for follow up of her chronic medical conditions. Her daughter is present during the visit.  HTN: BP is elevated today. Takes medications regularly. Patient denies headache, dizziness, chest pain, dyspnea or palpitations.   COPD: Well-controlled currently. She uses 2 inhalers at home, but unsure of names. Chart review suggests Spiriva and Stiolto along with PRN Albuterol.   CHF: Has been taking Torsemide. Still has mild LE edema. Denies any dyspnea or cough currently.   Hypothyroidism: C/o chronic fatigue. She states that she is unsure whether she has Levothyroxine at home. Of note, she does not wear CPAP for her OSA and has not had a sleep study for a long time.  Anxiety with insomnia: She takes Xanax once to twice as needed for anxiety. She has not been taking Ambien as she thinks that oversleeps with it and currently has insomnia. She is willing to take Ambien 5 mg QD. Denies anhedonia, SI or HI.   Past Medical History:  Diagnosis Date   Acute ischemic colitis (HCC) 09/11/2005   Anxiety    Arthritis    Atherosclerotic vascular disease    Calcified plaque at the origin of the celiac and SMA   CHF (congestive heart failure) (HCC)    Coronary atherosclerosis of native coronary artery    Mulitvessel LVEF 50-50%, DES circ 1/11, occluded SVG to OM and SVG to diagonal , LVEF 60%   COVID-19 virus infection  11/07/2020   Depression    Diverticulosis of colon    Essential hypertension    Hypothyroidism    Mesenteric ischemia (HCC)    Mitral regurgitation    Mild to moderate   Mixed hyperlipidemia    Myocardial infarction (HCC) 1990   Obesity    Orthostatic hypotension    PAD (peripheral artery disease) (HCC)    PVC's (premature ventricular contractions)    Schizophrenia (HCC)    SINUS BRADYCARDIA 10/13/2009   Qualifier: Diagnosis of  By: Carylon Perches    Sleep apnea    CPAP   TIA (transient ischemic attack) 11/20/2017   Tubular adenoma    Type 2 diabetes mellitus (HCC)    Vascular complications of mesenteric artery     Past Surgical History:  Procedure Laterality Date   BACK SURGERY     Lumbar spine surgery   Carotid endarectomy Bilateral    CARPAL TUNNEL RELEASE     Bilateral   CATARACT EXTRACTION W/PHACO Right 08/24/2013   Procedure: CATARACT EXTRACTION PHACO AND INTRAOCULAR LENS PLACEMENT (IOC);  Surgeon: Gemma Payor, MD;  Location: [facility];  Service: Ophthalmology;  Laterality: Right;  CDE 8.68   COLONOSCOPY  09/14/2005   Dr. Jacqlyn Larsen colitis   COLONOSCOPY WITH ESOPHAGOGASTRODUODENOSCOPY (EGD)  04/14/2012   Dr. Jena Gauss- EGD= gastric erosions of doubtful clinical significance per bx- chronic inactive gastritis, benign small bowel mucosa. TCS=colonic diverticulosis, tubular adenoma   CORONARY ARTERY BYPASS GRAFT     LIMA-LAD;  SVG-OM; SVG-DX in [place]     Cervical laminectomy   PARTIAL HYSTERECTOMY     stents x4      Family History  Problem Relation Age of Onset   Alcohol abuse Mother    Alcohol abuse Father    Coronary artery disease Other    Hypertension Other    Crohn's disease Sister 46   Stroke Daughter     Social History   Socioeconomic History   Marital status: Widowed    Spouse name: Not on file   Number of children: 4   Years of education: Not on file   Highest education level: Not on file  Occupational History    Occupation: DISABLED    Employer: UNEMPLOYED  Tobacco Use   Smoking status: Light Smoker    Packs/day: 0.50    Years: 50.00    Pack years: 25.00    Types: Cigarettes    Start date: 12/30/2018   Smokeless tobacco: Never  Vaping Use   Vaping Use: Never used  Substance and Sexual Activity   Alcohol use: Yes    Alcohol/week: 0.0 standard drinks    Comment: occasionally    Drug use: No   Sexual activity: Not on file  Other Topics Concern   Not on file  Social History Narrative   LIves alone   Social Determinants of Health   Financial Resource Strain: Not on file  Food Insecurity: Not on file  Transportation Needs: Not on file  Physical Activity: Not on file  Stress: Not on file  Social Connections: Not on file  Intimate Partner Violence: Not on file    Outpatient Medications Prior to Visit  Medication Sig Dispense Refill   albuterol (VENTOLIN HFA) 108 (90 Base) MCG/ACT inhaler USE 2 INHALATIONS BY MOUTH  EVERY 6 HOURS AS NEEDED FOR WHEEZING OR SHORTNESS OF  BREATH 18 g 0   ALPRAZolam (XANAX) 0.5 MG tablet Take 1 tablet (0.5 mg total) by mouth 2 (two) times daily as needed. for anxiety 60 tablet 2   aspirin EC 81 MG tablet Take 81 mg by mouth daily.     clopidogrel (PLAVIX) 75 MG tablet Take 1 tablet (75 mg total) by mouth daily. 90 tablet 2   fluticasone (FLOVENT HFA) 110 MCG/ACT inhaler Inhale 1 puff into the lungs in the morning and at bedtime. 1 each 12   isosorbide mononitrate (IMDUR) 60 MG 24 hr tablet Take 1 tablet (60 mg total) by mouth daily. 90 tablet 2   levothyroxine (SYNTHROID) 50 MCG tablet TAKE 1 TABLET BY MOUTH  DAILY BEFORE BREAKFAST 30 tablet 11   metoprolol succinate (TOPROL XL) 25 MG 24 hr tablet Take 1 tablet (25 mg total) by mouth daily. 90 tablet 2   MYRBETRIQ 25 MG TB24 tablet TAKE ONE (1) TABLET BY MOUTH EVERY DAY 30 tablet 0   nitroGLYCERIN (NITROSTAT) 0.4 MG SL tablet Place 1 tablet (0.4 mg total) under the tongue every 5 (five) minutes as needed. 90  tablet 3   pantoprazole (PROTONIX) 40 MG tablet Take 1 tablet (40 mg total) by mouth daily. 90 tablet 3   pravastatin (PRAVACHOL) 80 MG tablet TAKE 1 TABLET BY MOUTH  DAILY 90 tablet 3   Tiotropium Bromide-Olodaterol (STIOLTO RESPIMAT) 2.5-2.5 MCG/ACT AERS Inhale 2 puffs into the lungs daily. 1 Inhaler 0   tiZANidine (ZANAFLEX) 4 MG tablet Take 1 tablet (4 mg total) by mouth every 6 (six) hours as needed for muscle spasms. 30 tablet 0  torsemide (DEMADEX) 20 MG tablet Take 2 tablets (40 mg total) by mouth daily. 180 tablet 2   triamcinolone cream (KENALOG) 0.1 % APPLY TOPICALLY TWICE DAILY 30 g 0   zolpidem (AMBIEN) 10 MG tablet Take 10 mg by mouth at bedtime as needed for sleep.     No facility-administered medications prior to visit.    No Known Allergies  ROS Review of Systems  Constitutional:  Positive for fatigue. Negative for chills and fever.  HENT:  Negative for congestion, sinus pressure, sinus pain and sore throat.   Eyes:  Negative for pain and discharge.  Respiratory:  Negative for cough and shortness of breath.   Cardiovascular:  Negative for chest pain and palpitations.  Gastrointestinal:  Negative for abdominal pain, diarrhea, nausea and vomiting.  Endocrine: Negative for polydipsia and polyuria.  Genitourinary:  Negative for dysuria and hematuria.  Musculoskeletal:  Positive for arthralgias and neck pain. Negative for neck stiffness.  Skin:  Negative for rash.  Neurological:  Negative for dizziness and weakness.  Psychiatric/Behavioral:  Positive for sleep disturbance. Negative for agitation and behavioral problems. The patient is nervous/anxious.      Objective:    Physical Exam Vitals reviewed.  Constitutional:      General: She is not in acute distress.    Appearance: She is obese. She is not diaphoretic.  HENT:     Head: Normocephalic and atraumatic.     Nose: Nose normal. No congestion.     Mouth/Throat:     Mouth: Mucous membranes are moist.      Pharynx: No posterior oropharyngeal erythema.  Eyes:     General: No scleral icterus.    Extraocular Movements: Extraocular movements intact.     Pupils: Pupils are equal, round, and reactive to light.  Cardiovascular:     Rate and Rhythm: Normal rate and regular rhythm.     Pulses: Normal pulses.     Heart sounds: Normal heart sounds. No murmur heard. Pulmonary:     Breath sounds: Normal breath sounds. No wheezing or rales.  Abdominal:     Palpations: Abdomen is soft.     Tenderness: There is no abdominal tenderness.  Musculoskeletal:     Cervical back: Normal range of motion and neck supple. No rigidity or tenderness.     Right lower leg: Edema (1+) present.     Left lower leg: Edema (1+) present.  Skin:    General: Skin is warm.     Findings: No rash.  Neurological:     General: No focal deficit present.     Mental Status: She is alert and oriented to person, place, and time.     Sensory: No sensory deficit.     Motor: No weakness.     Gait: Gait abnormal (Likely in the setting of baseline hip pain).  Psychiatric:        Mood and Affect: Mood normal.        Behavior: Behavior normal.    BP (!) 143/74 (BP Location: Left Arm, Patient Position: Sitting, Cuff Size: Normal)   Pulse 71   Temp 97.7 F (36.5 C) (Oral)   Resp 18   Ht 5\' 3"  (1.6 m)   Wt 239 lb (108.4 kg)   SpO2 94%   BMI 42.34 kg/m  Wt Readings from Last 3 Encounters:  12/08/20 239 lb (108.4 kg)  10/12/20 243 lb (110.2 kg)  10/05/20 239 lb (108.4 kg)     Health Maintenance Due  Topic Date Due  OPHTHALMOLOGY EXAM  Never done   DEXA SCAN  Never done   COVID-19 Vaccine (3 - Moderna risk series) 03/08/2020   URINE MICROALBUMIN  10/14/2020   INFLUENZA VACCINE  11/28/2020    There are no preventive care reminders to display for this patient.  Lab Results  Component Value Date   TSH 1.140 08/03/2020   Lab Results  Component Value Date   WBC 4.2 11/30/2020   HGB 11.8 11/30/2020   HCT 36.1  11/30/2020   MCV 95 11/30/2020   PLT 180 11/30/2020   Lab Results  Component Value Date   NA 144 11/30/2020   K 5.2 11/30/2020   CO2 22 11/30/2020   GLUCOSE 131 (H) 11/30/2020   BUN 15 11/30/2020   CREATININE 1.13 (H) 11/30/2020   BILITOT 0.2 11/30/2020   ALKPHOS 130 (H) 11/30/2020   AST 16 11/30/2020   ALT 9 11/30/2020   PROT 6.8 11/30/2020   ALBUMIN 3.9 11/30/2020   CALCIUM 9.2 11/30/2020   ANIONGAP 10 06/14/2020   EGFR 50 (L) 11/30/2020   Lab Results  Component Value Date   CHOL 142 08/19/2017   Lab Results  Component Value Date   HDL 45 08/19/2017   Lab Results  Component Value Date   LDLCALC 66 08/19/2017   Lab Results  Component Value Date   TRIG 154 (H) 08/19/2017   Lab Results  Component Value Date   CHOLHDL 3.2 08/19/2017   Lab Results  Component Value Date   HGBA1C 6.7 (H) 08/03/2020      Assessment & Plan:   Problem List Items Addressed This Visit       Cardiovascular and Mediastinum   Benign essential hypertension - Primary    BP Readings from Last 1 Encounters:  12/08/20 (!) 143/74  Elevated today Overall well-controlled considering her age 4 for compliance with the medications Advised DASH diet      PAD (peripheral artery disease) (HCC)    On DAPT and statin        Respiratory   OSA (obstructive sleep apnea)    Does not wear CPAP device Has not had sleep study for a long time Referred to Pulmonology for sleep study      Relevant Orders   Ambulatory referral to Pulmonology   COPD (chronic obstructive pulmonary disease) ([place])    Well-controlled with Stiolto, Flovent and PRN Albuterol        Endocrine   DM2 (diabetes mellitus, type 2) ([place])     Lab Results  Component Value Date   HGBA1C 6.7 (H) 08/03/2020    Diet controlled Avoid tighter control in her case due to risk of hypoglycemia related fall      Hypothyroidism    Lab Results  Component Value Date   TSH 1.140 08/03/2020  On Levothyroxine 50 mcg  recently Check TSH and free T4, adjust dose accordingly        Genitourinary   Stage 3 chronic kidney disease ([place])    Likely due to HTN and DM On Torsemide for HFrEF Avoid nephrotoxic agents including NSAIDs Referred to Nephrology      Relevant Orders   Ambulatory referral to Nephrology     Other   Anxiety    Takes Xanax, sometimes twice daily Advised to decrease Xanax to once daily and started Buspar Patient also takes Ambien for insomnia.      Relevant Medications   busPIRone (BUSPAR) 5 MG tablet    Meds ordered this encounter  Medications  busPIRone (BUSPAR) 5 MG tablet    Sig: Take 1 tablet (5 mg total) by mouth 2 (two) times daily.    Dispense:  60 tablet    Refill:  2    Follow-up: Return in about 4 months (around 04/09/2021) for Hypothyroidism, CKD and COPD.    Lindell Spar, MD

## 2020-12-11 NOTE — Assessment & Plan Note (Signed)
Does not wear CPAP device Has not had sleep study for a long time Referred to Pulmonology for sleep study

## 2020-12-11 NOTE — Assessment & Plan Note (Signed)
Likely due to HTN and DM On Torsemide for HFrEF Avoid nephrotoxic agents including NSAIDs Referred to Nephrology

## 2020-12-22 ENCOUNTER — Ambulatory Visit: Payer: Medicare Other

## 2020-12-29 ENCOUNTER — Ambulatory Visit (INDEPENDENT_AMBULATORY_CARE_PROVIDER_SITE_OTHER): Payer: Medicare Other

## 2020-12-29 ENCOUNTER — Other Ambulatory Visit: Payer: Self-pay

## 2020-12-29 DIAGNOSIS — I6523 Occlusion and stenosis of bilateral carotid arteries: Secondary | ICD-10-CM

## 2020-12-30 ENCOUNTER — Telehealth: Payer: Self-pay

## 2020-12-30 NOTE — Telephone Encounter (Signed)
-----   Message from Satira Sark, MD sent at 12/30/2020 10:23 AM EDT ----- Results reviewed.  Mild nonobstructive bilateral ICA stenoses.  Continue medical therapy.

## 2020-12-30 NOTE — Telephone Encounter (Addendum)
Carotid US results discussed with family. They state they want to see Misty Dung, NP to discuss her heart rhythm and if she could have A-fib. No previous history noted of A-fib in chart. Granddaughter states that Ms.Grossman was at [facility] last February for bowel obstruction-perforation  and was told she has A-fib. She describes sensation of heart going "up and down" and it feels like it "starts and stops" over the past week.   Granddaughter is not with patient and cannot take her vital signs.

## 2021-01-03 ENCOUNTER — Other Ambulatory Visit: Payer: Self-pay

## 2021-01-03 ENCOUNTER — Telehealth: Payer: Self-pay | Admitting: Cardiology

## 2021-01-03 ENCOUNTER — Ambulatory Visit (INDEPENDENT_AMBULATORY_CARE_PROVIDER_SITE_OTHER): Payer: Medicare Other

## 2021-01-03 ENCOUNTER — Encounter: Payer: Self-pay | Admitting: Cardiology

## 2021-01-03 ENCOUNTER — Ambulatory Visit: Payer: Medicare Other | Admitting: Cardiology

## 2021-01-03 ENCOUNTER — Encounter: Payer: Self-pay | Admitting: *Deleted

## 2021-01-03 ENCOUNTER — Other Ambulatory Visit: Payer: Self-pay | Admitting: Cardiology

## 2021-01-03 VITALS — BP 130/80 | HR 54 | Ht 63.0 in | Wt 238.0 lb

## 2021-01-03 DIAGNOSIS — I4891 Unspecified atrial fibrillation: Secondary | ICD-10-CM

## 2021-01-03 DIAGNOSIS — I25119 Atherosclerotic heart disease of native coronary artery with unspecified angina pectoris: Secondary | ICD-10-CM

## 2021-01-03 DIAGNOSIS — Z8679 Personal history of other diseases of the circulatory system: Secondary | ICD-10-CM

## 2021-01-03 DIAGNOSIS — I9789 Other postprocedural complications and disorders of the circulatory system, not elsewhere classified: Secondary | ICD-10-CM | POA: Diagnosis not present

## 2021-01-03 DIAGNOSIS — R079 Chest pain, unspecified: Secondary | ICD-10-CM | POA: Diagnosis not present

## 2021-01-03 DIAGNOSIS — R002 Palpitations: Secondary | ICD-10-CM | POA: Diagnosis not present

## 2021-01-03 NOTE — Telephone Encounter (Signed)
/  Checking percert on the following patient for esting scheduled at [facility].    LEX[facility]AN  01-11-2021  7 day zio monitor

## 2021-01-03 NOTE — Progress Notes (Signed)
Cardiology Office Note  Date: 01/03/2021   ID: DILANA [name], DOB 02-01-1946, MRN [id number]  PCP:  Lindell Spar, MD  Cardiologist:  Rozann Lesches, MD Electrophysiologist:  None   Chief Complaint  Patient presents with   Cardiac follow-up    History of Present Illness: [name] is a 75 y.o. female last seen in June by Mr. [name].  I reviewed interval telephone note with family concern for possible atrial fibrillation.  She is here with her daughter.  In terms of symptoms, she states that she has been fatigued with activity and short of breath, also feels rapid heartbeat and occasional sense of skipping.  This has been going on for several weeks.  She also tells me that she experienced chest discomfort for 4 days last week.  She has a history of critical mesenteric ischemia for which she required right hemicolectomy and right iliac to SMA bypass performed at [facility] on 06/13/2019.  She did have postoperative atrial fibrillation and was temporarily on amiodarone.  Since that time no obvious recurrence.  Cardiac monitor from March showed atrial and ventricular ectopy but no atrial fibrillation.  Patients which are noted below.  She has come off several things and the list is updated.  Cardiac regimen is relatively stable however.  I personally reviewed her ECG today which shows sinus bradycardia with prolonged PR interval, heart rate in the 50s, nonspecific T wave changes.  Past Medical History:  Diagnosis Date   Acute ischemic colitis ([place]) 09/11/2005   Anxiety    Arthritis    Atherosclerotic vascular disease    Calcified plaque at the origin of the celiac and SMA   CHF (congestive heart failure) (HCC)    Coronary atherosclerosis of native coronary artery    Mulitvessel LVEF 50-50%, DES circ 1/11, occluded SVG to OM and SVG to diagonal , LVEF 60%   COVID-19 virus infection 11/07/2020   Depression    Diverticulosis of colon    Essential hypertension     Hypothyroidism    Mesenteric ischemia (HCC)    Mitral regurgitation    Mild to moderate   Mixed hyperlipidemia    Myocardial infarction ([place]) 1990   Obesity    Orthostatic hypotension    PAD (peripheral artery disease) (HCC)    PVC's (premature ventricular contractions)    Schizophrenia ([place])    SINUS BRADYCARDIA 10/13/2009   Qualifier: Diagnosis of  By: Patterson Hammersmith    Sleep apnea    CPAP   TIA (transient ischemic attack) 11/20/2017   Tubular adenoma    Type 2 diabetes mellitus (HCC)    Vascular complications of mesenteric artery     Past Surgical History:  Procedure Laterality Date   BACK SURGERY     Lumbar spine surgery   Carotid endarectomy Bilateral    CARPAL TUNNEL RELEASE     Bilateral   CATARACT EXTRACTION W/PHACO Right 08/24/2013   Procedure: CATARACT EXTRACTION PHACO AND INTRAOCULAR LENS PLACEMENT ([place]);  Surgeon: Misty Branch, MD;  Location: [facility];  Service: Ophthalmology;  Laterality: Right;  CDE 8.68   COLONOSCOPY  09/14/2005   Dr. Leonard Schwartz colitis   COLONOSCOPY WITH ESOPHAGOGASTRODUODENOSCOPY (EGD)  04/14/2012   Dr. Gala [name]- EGD= gastric erosions of doubtful clinical significance per bx- chronic inactive gastritis, benign small bowel mucosa. TCS=colonic diverticulosis, tubular adenoma   CORONARY ARTERY BYPASS GRAFT     LIMA-LAD; SVG-OM; SVG-DX in [place]     Cervical laminectomy  PARTIAL HYSTERECTOMY     stents x4      Current Outpatient Medications  Medication Sig Dispense Refill   albuterol (VENTOLIN HFA) 108 (90 Base) MCG/ACT inhaler USE 2 INHALATIONS BY MOUTH  EVERY 6 HOURS AS NEEDED FOR WHEEZING OR SHORTNESS OF  BREATH 18 g 0   ALPRAZolam (XANAX) 0.5 MG tablet Take 1 tablet (0.5 mg total) by mouth 2 (two) times daily as needed. for anxiety 60 tablet 2   aspirin EC 81 MG tablet Take 81 mg by mouth daily.     clopidogrel (PLAVIX) 75 MG tablet Take 1 tablet (75 mg total) by mouth daily. 90 tablet 2   fluticasone  (FLOVENT HFA) 110 MCG/ACT inhaler Inhale 1 puff into the lungs in the morning and at bedtime. 1 each 12   isosorbide mononitrate (IMDUR) 60 MG 24 hr tablet Take 1 tablet (60 mg total) by mouth daily. 90 tablet 2   levothyroxine (SYNTHROID) 50 MCG tablet TAKE 1 TABLET BY MOUTH  DAILY BEFORE BREAKFAST 30 tablet 11   metoprolol succinate (TOPROL XL) 25 MG 24 hr tablet Take 1 tablet (25 mg total) by mouth daily. 90 tablet 2   nitroGLYCERIN (NITROSTAT) 0.4 MG SL tablet Place 1 tablet (0.4 mg total) under the tongue every 5 (five) minutes as needed. 90 tablet 3   pravastatin (PRAVACHOL) 80 MG tablet TAKE 1 TABLET BY MOUTH  DAILY 90 tablet 3   tiZANidine (ZANAFLEX) 4 MG tablet Take 1 tablet (4 mg total) by mouth every 6 (six) hours as needed for muscle spasms. 30 tablet 0   torsemide (DEMADEX) 20 MG tablet Take 2 tablets (40 mg total) by mouth daily. 180 tablet 2   zolpidem (AMBIEN) 10 MG tablet Take 10 mg by mouth at bedtime as needed for sleep.     No current facility-administered medications for this visit.   Allergies:  Patient has no known allergies.   ROS: Hearing loss.  Physical Exam: VS:  BP 130/80   Pulse (!) 54   Ht '5\' 3"'$  (1.6 m)   Wt 238 lb (108 kg)   SpO2 96%   BMI 42.16 kg/m , BMI Body mass index is 42.16 kg/m.  Wt Readings from Last 3 Encounters:  01/03/21 238 lb (108 kg)  12/08/20 239 lb (108.4 kg)  10/12/20 243 lb (110.2 kg)    General: Patient appears comfortable at rest. HEENT: Conjunctiva and lids normal, wearing a mask. Neck: Supple, no elevated JVP or carotid bruits, no thyromegaly. Lungs: Clear to auscultation, nonlabored breathing at rest. Cardiac: Regular rate and rhythm, no S3, 1/6 systolic murmur, no pericardial rub. Extremities: Trace ankle edema.  ECG:  An ECG dated 09/30/2020 was personally reviewed today and demonstrated:  Sinus bradycardia, nonspecific T wave changes.  Recent Labwork: 08/03/2020: TSH 1.140 11/30/2020: ALT 9; AST 16; BUN 15; Creatinine, Ser  1.13; Hemoglobin 11.8; Platelets 180; Potassium 5.2; Sodium 144     Component Value Date/Time   CHOL 142 08/19/2017 1505   TRIG 154 (H) 08/19/2017 1505   HDL 45 08/19/2017 1505   CHOLHDL 3.2 08/19/2017 1505   LDLCALC 66 08/19/2017 1505    Other Studies Reviewed Today:  Echocardiogram 06/14/2020:  1. Abnormal septal motion distal septal apical mid and basal inferior  wall hypokinesis . Left ventricular ejection fraction, by estimation, is  45 to 50%. The left ventricle has mildly decreased function. The left  ventricle has no regional wall motion  abnormalities. The left ventricular internal cavity size was mildly  dilated.  There is moderate left ventricular hypertrophy. Left ventricular  diastolic parameters are consistent with Grade I diastolic dysfunction  (impaired relaxation).   2. Right ventricular systolic function is normal. The right ventricular  size is normal.   3. Left atrial size was mildly dilated.   4. The mitral valve is degenerative. Mild mitral valve regurgitation. No  evidence of mitral stenosis.   5. The aortic valve is tricuspid. Aortic valve regurgitation is not  visualized. Mild aortic valve sclerosis is present, with no evidence of  aortic valve stenosis.   6. The inferior vena cava is normal in size with greater than 50%  respiratory variability, suggesting right atrial pressure of 3 mmHg.   Cardiac monitor March 2022: ZIO AT reviewed.  11 days 20 hours analyzed.  Predominant rhythm is sinus with heart rate ranging from 49 bpm up to 100 bpm and average heart rate 61 bpm.  PR interval prolonged at baseline.  There were rare PACs including couplets and triplets representing less than 1% total beats.  There were also rare PVCs including couplets and triplets representing less than 1% total beats.  Ventricular trigeminy noted.  Single run of NSVT lasting 11 beats noted.  There were otherwise several brief episodes of PSVT, the longest of which was only 8 beats.  No  sustained arrhythmias or pauses.  Carotid Dopplers 12/29/2020: Summary:  Right Carotid: Velocities in the right ICA are consistent with a 1-39%  stenosis.                 The ECA appears >50% stenosed.   Left Carotid: Velocities in the left ICA are consistent with a 1-39%  stenosis.   Vertebrals:  Left vertebral artery demonstrates antegrade flow. Right  atypical               antegrade.  Subclavians: Right subclavian artery flow was disturbed. Normal flow               hemodynamics were seen in the left subclavian artery.   Assessment and Plan:  1.  Recurrent palpitations and shortness of breath as discussed above.  She has a history of postoperative atrial fibrillation last year, no definite recurrence and did have a cardiac monitor done back in March which showed atrial and ventricular ectopy as well as a single run of NSVT.  ECG reviewed today showing sinus bradycardia.  Continue beta-blocker.  Follow-up with 7-day Zio patch since his symptoms have worsened.  If she has evidence of recurrent atrial fibrillation, we will need to consider initiation of DOAC and simplification of her antiplatelet regimen.  2.  Fatigue and shortness of breath, also chest pain reported last week.  She has known multivessel CAD status post CABG as well as DES to the circumflex and known graft disease including occlusion of the SVG to OM and SVG to diagonal.  Plan to obtain a Lexiscan Myoview to reevaluate ischemic burden.  Otherwise continue aspirin, Plavix, Imdur, Toprol-XL, and Pravachol.  Medication Adjustments/Labs and Tests Ordered: Current medicines are reviewed at length with the patient today.  Concerns regarding medicines are outlined above.   Tests Ordered: Orders Placed This Encounter  Procedures   NM Myocar Multi W/Spect W/Wall Motion / EF   EKG 12-Lead    Medication Changes: No orders of the defined types were placed in this encounter.   Disposition:  Follow up  test  results.  Signed, Satira Sark, MD, [facility] 01/03/2021 2:29 PM    [facility] [place], [place], [place] [address] Phone: [phone]; Fax: [phone]

## 2021-01-03 NOTE — Patient Instructions (Addendum)
Medication Instructions:  Your physician recommends that you continue on your current medications as directed. Please refer to the Current Medication list given to you today.  Labwork: none  Testing/Procedures: Your physician has requested that you have a lexiscan myoview. For further information please visit [email or web address]. Please follow instruction sheet, as given. ZIO- Long Term Monitor Instructions   Your physician has requested you wear your ZIO patch monitor 7 days.   This is a single patch monitor.  [employer] supplies one patch monitor per enrollment.  Additional stickers are not available.   Please do not apply patch if you will be having a Nuclear Stress Test, Echocardiogram, Cardiac CT, MRI, or Chest Xray during the time frame you would be wearing the monitor. The patch cannot be worn during these tests.  You cannot remove and re-apply the ZIO XT patch monitor.     Once you have received you monitor, please review enclosed instructions.  Your monitor has already been registered assigning a specific monitor serial # to you.   Applying the monitor   Shave hair from upper left chest.   Hold abrader disc by orange tab.  Rub abrader in 40 strokes over left upper chest as indicated in your monitor instructions.   Clean area with 4 enclosed alcohol pads .  Use all pads to assure are is cleaned thoroughly.  Let dry.   Apply patch as indicated in monitor instructions.  Patch will be place under collarbone on left side of chest with arrow pointing upward.   Rub patch adhesive wings for 2 minutes.Remove white label marked "1".  Remove white label marked "2".  Rub patch adhesive wings for 2 additional minutes.   While looking in a mirror, press and release button in center of patch.  A small green light will flash 3-4 times .  This will be your only indicator the monitor has been turned on.     Do not shower for the first 24 hours.  You may shower after the first 24 hours.    Press button if you feel a symptom. You will hear a small click.  Record Date, Time and Symptom in the Patient Log Book.   When you are ready to remove patch, follow instructions on last 2 pages of Patient Log Book.  Stick patch monitor onto last page of Patient Log Book.   Place Patient Log Book in [place] box.  Use locking tab on box and tape box closed securely.  The [employer] and [employer] has [employer] on it.  Please place in mailbox as soon as possible.  Your physician should have your test results approximately 7 days after the monitor has been mailed back to [facility].   Call [place] at [phone] if you have questions regarding your ZIO XT patch monitor.  Call them immediately if you see an orange light blinking on your monitor.   If your monitor falls off in less than 4 days contact our Monitor department at [phone].  If your monitor becomes loose or falls off after 4 days call [employer] at [phone] for suggestions on securing your monitor.  Follow-Up: Your physician recommends that you schedule a follow-up appointment in: pending  Any Other Special Instructions Will Be Listed Below (If Applicable).  If you need a refill on your cardiac medications before your next appointment, please call your pharmacy.

## 2021-01-06 ENCOUNTER — Ambulatory Visit (INDEPENDENT_AMBULATORY_CARE_PROVIDER_SITE_OTHER): Payer: Medicare Other

## 2021-01-06 ENCOUNTER — Other Ambulatory Visit: Payer: Self-pay

## 2021-01-06 DIAGNOSIS — Z5329 Procedure and treatment not carried out because of patient's decision for other reasons: Secondary | ICD-10-CM

## 2021-01-06 NOTE — Progress Notes (Deleted)
Subjective:   [name] is a 75 y.o. female who presents for an Initial Medicare Annual Wellness Visit. I connected with  [name] on 01/06/21 by a audio enabled telemedicine application and verified that I am speaking with the correct person using two identifiers.   I discussed the limitations of evaluation and management by telemedicine. The patient expressed understanding and agreed to proceed.   Location of patient:Home  Location of Provider: Office  Persons participating in virtual visit: [name] (patient) and [name]  Review of Systems    Defer to PCP       Objective:    There were no vitals filed for this visit. There is no height or weight on file to calculate BMI.  Advanced Directives 09/27/2020 05/07/2020 09/29/2019 09/29/2019 08/18/2013 04/24/2012 04/14/2012  Does Patient Have a Medical Advance Directive? No No No No Patient does not have advance directive;Patient would not like information Patient does not have advance directive Patient does not have advance directive;Patient would not like information  Would patient like information on creating a medical advance directive? No - Patient declined - No - Patient declined - - - -  Pre-existing out of facility DNR order (yellow form or pink MOST form) - - - - No No No    Current Medications (verified) Outpatient Encounter Medications as of 01/06/2021  Medication Sig   albuterol (VENTOLIN HFA) 108 (90 Base) MCG/ACT inhaler USE 2 INHALATIONS BY MOUTH  EVERY 6 HOURS AS NEEDED FOR WHEEZING OR SHORTNESS OF  BREATH   ALPRAZolam (XANAX) 0.5 MG tablet Take 1 tablet (0.5 mg total) by mouth 2 (two) times daily as needed. for anxiety   aspirin EC 81 MG tablet Take 81 mg by mouth daily.   clopidogrel (PLAVIX) 75 MG tablet Take 1 tablet (75 mg total) by mouth daily.   fluticasone (FLOVENT HFA) 110 MCG/ACT inhaler Inhale 1 puff into the lungs in the morning and at bedtime.   isosorbide mononitrate (IMDUR) 60 MG 24  hr tablet Take 1 tablet (60 mg total) by mouth daily.   levothyroxine (SYNTHROID) 50 MCG tablet TAKE 1 TABLET BY MOUTH  DAILY BEFORE BREAKFAST   metoprolol succinate (TOPROL XL) 25 MG 24 hr tablet Take 1 tablet (25 mg total) by mouth daily.   nitroGLYCERIN (NITROSTAT) 0.4 MG SL tablet Place 1 tablet (0.4 mg total) under the tongue every 5 (five) minutes as needed.   pravastatin (PRAVACHOL) 80 MG tablet TAKE 1 TABLET BY MOUTH  DAILY   tiZANidine (ZANAFLEX) 4 MG tablet Take 1 tablet (4 mg total) by mouth every 6 (six) hours as needed for muscle spasms.   torsemide (DEMADEX) 20 MG tablet Take 2 tablets (40 mg total) by mouth daily.   zolpidem (AMBIEN) 10 MG tablet Take 10 mg by mouth at bedtime as needed for sleep.   No facility-administered encounter medications on file as of 01/06/2021.    Allergies (verified) Patient has no known allergies.   History: Past Medical History:  Diagnosis Date   Acute ischemic colitis ([place]) 09/11/2005   Anxiety    Arthritis    Atherosclerotic vascular disease    Calcified plaque at the origin of the celiac and SMA   CHF (congestive heart failure) (HCC)    Coronary atherosclerosis of native coronary artery    Mulitvessel LVEF 50-50%, DES circ 1/11, occluded SVG to OM and SVG to diagonal , LVEF 60%   COVID-19 virus infection 11/07/2020   Depression    Diverticulosis of  colon    Essential hypertension    Hypothyroidism    Mesenteric ischemia (HCC)    Mitral regurgitation    Mild to moderate   Mixed hyperlipidemia    Myocardial infarction ([place]) 1990   Obesity    Orthostatic hypotension    PAD (peripheral artery disease) (HCC)    PVC's (premature ventricular contractions)    Schizophrenia ([place])    SINUS BRADYCARDIA 10/13/2009   Qualifier: Diagnosis of  By: Patterson Hammersmith    Sleep apnea    CPAP   TIA (transient ischemic attack) 11/20/2017   Tubular adenoma    Type 2 diabetes mellitus [facility])    Vascular complications of mesenteric artery     Past Surgical History:  Procedure Laterality Date   BACK SURGERY     Lumbar spine surgery   Carotid endarectomy Bilateral    CARPAL TUNNEL RELEASE     Bilateral   CATARACT EXTRACTION W/PHACO Right 08/24/2013   Procedure: CATARACT EXTRACTION PHACO AND INTRAOCULAR LENS PLACEMENT ([place]);  Surgeon: Tonny Branch, MD;  Location: [facility];  Service: Ophthalmology;  Laterality: Right;  CDE 8.68   COLONOSCOPY  09/14/2005   Dr. Leonard Schwartz colitis   COLONOSCOPY WITH ESOPHAGOGASTRODUODENOSCOPY (EGD)  04/14/2012   Dr. Gala Romney- EGD= gastric erosions of doubtful clinical significance per bx- chronic inactive gastritis, benign small bowel mucosa. TCS=colonic diverticulosis, tubular adenoma   CORONARY ARTERY BYPASS GRAFT     LIMA-LAD; SVG-OM; SVG-DX in [place]     Cervical laminectomy   PARTIAL HYSTERECTOMY     stents x4     Family History  Problem Relation Age of Onset   Alcohol abuse Mother    Alcohol abuse Father    Coronary artery disease Other    Hypertension Other    Crohn's disease Sister 45   Stroke Daughter    Social History   Socioeconomic History   Marital status: Widowed    Spouse name: Not on file   Number of children: 4   Years of education: Not on file   Highest education level: Not on file  Occupational History   Occupation: DISABLED    Employer: UNEMPLOYED  Tobacco Use   Smoking status: Light Smoker    Packs/day: 0.50    Years: 50.00    Pack years: 25.00    Types: Cigarettes    Start date: 12/30/2018   Smokeless tobacco: Never  Vaping Use   Vaping Use: Never used  Substance and Sexual Activity   Alcohol use: Yes    Alcohol/week: 0.0 standard drinks    Comment: occasionally    Drug use: No   Sexual activity: Not on file  Other Topics Concern   Not on file  Social History Narrative   LIves alone   Social Determinants of Health   Financial Resource Strain: Not on file  Food Insecurity: Not on file  Transportation Needs: Not on file   Physical Activity: Not on file  Stress: Not on file  Social Connections: Not on file    Tobacco Counseling Ready to quit: Not Answered Counseling given: Not Answered   Clinical Intake:                 Diabetic?YES Nutrition Risk Assessment:  Has the patient had any N/V/D within the last 2 months?  {YES/NO:21197} Does the patient have any non-healing wounds?  {YES/NO:21197} Has the patient had any unintentional weight loss or weight gain?  {YES/NO:21197}  Diabetes:  Is the patient diabetic?  {  YES/NO:21197} If diabetic, was a CBG obtained today?  {YES/NO:21197} Did the patient bring in their glucometer from home?  {YES/NO:21197} How often do you monitor your CBG's? ***.   Financial Strains and Diabetes Management:  Are you having any financial strains with the device, your supplies or your medication? {YES/NO:21197}.  Does the patient want to be seen by Chronic Care Management for management of their diabetes?  {YES/NO:21197} Would the patient like to be referred to a Nutritionist or for Diabetic Management?  {YES/NO:21197}  Diabetic Exams:  Diabetic Eye Exam: Overdue for diabetic eye exam. Pt has been advised about the importance in completing this exam. Patient advised to call and schedule an eye exam. Diabetic Foot Exam: Completed 08/03/2020           Activities of Daily Living In your present state of health, do you have any difficulty performing the following activities: 12/08/2020  Hearing? N  Vision? N  Difficulty concentrating or making decisions? N  Walking or climbing stairs? Y  Dressing or bathing? N  Doing errands, shopping? N  Some recent data might be hidden    Patient Care Team: Lindell Spar, MD as PCP - General (Internal Medicine) Satira Sark, MD as PCP - Cardiology (Cardiology) Gala Romney Cristopher Estimable, MD as Attending Physician (Gastroenterology)  Indicate any recent Medical Services you may have received from other than [facility]  providers in the past year (date may be approximate).     Assessment:   This is a routine wellness examination for Amar[place]wu.  Hearing/Vision screen No results found.  Dietary issues and exercise activities discussed:     Goals Addressed   None    Depression Screen PHQ 2/9 Scores 12/08/2020 11/07/2020 10/20/2020 10/12/2020 10/05/2020 08/03/2020 05/13/2020  PHQ - 2 Score 3 0 '1 1 1 2 2  '$ PHQ- 9 Score 17 - - - - 11 9    Fall Risk Fall Risk  12/08/2020 11/07/2020 10/20/2020 10/12/2020 10/05/2020  Falls in the past year? 0 0 '1 1 1  '$ Number falls in past yr: 0 0 0 1 0  Injury with Fall? 0 0 0 0 0  Risk for fall due to : No Fall Risks No Fall Risks No Fall Risks No Fall Risks Impaired balance/gait  Follow up Falls evaluation completed Falls evaluation completed Falls evaluation completed Falls evaluation completed Falls evaluation completed    FALL RISK PREVENTION PERTAINING TO THE HOME:  Any stairs in or around the home? {YES/NO:21197} If so, are there any without handrails? {YES/NO:21197} Home free of loose throw rugs in walkways, pet beds, electrical cords, etc? {YES/NO:21197} Adequate lighting in your home to reduce risk of falls? {YES/NO:21197}  ASSISTIVE DEVICES UTILIZED TO PREVENT FALLS:  Life alert? {YES/NO:21197} Use of a cane, walker or w/c? {YES/NO:21197} Grab bars in the bathroom? {YES/NO:21197} Shower chair or bench in shower? {YES/NO:21197} Elevated toilet seat or a handicapped toilet? {YES/NO:21197}  TIMED UP AND GO:  Was the test performed?  N/A .  Length of time to ambulate 10 feet:  N/A sec.     Cognitive Function:        Immunizations Immunization History  Administered Date(s) Administered   Fluad Quad(high Dose 65+) 02/09/2020   Influenza Split 04/10/2010   Moderna Sars-Covid-2 Vaccination 12/15/2019, 02/09/2020    TDAP status: Due, Education has been provided regarding the importance of this vaccine. Advised may receive this vaccine at local pharmacy or  Health Dept. Aware to provide a copy of the vaccination record if obtained from  local pharmacy or Health Dept. Verbalized acceptance and understanding.  Flu Vaccine status: Due, Education has been provided regarding the importance of this vaccine. Advised may receive this vaccine at local pharmacy or Health Dept. Aware to provide a copy of the vaccination record if obtained from local pharmacy or Health Dept. Verbalized acceptance and understanding.  Pneumococcal vaccine status: Due, Education has been provided regarding the importance of this vaccine. Advised may receive this vaccine at local pharmacy or Health Dept. Aware to provide a copy of the vaccination record if obtained from local pharmacy or Health Dept. Verbalized acceptance and understanding.  Covid-19 vaccine status: Information provided on how to obtain vaccines.   Qualifies for Shingles Vaccine? Yes   Zostavax completed No   Shingrix Completed?: No.    Education has been provided regarding the importance of this vaccine. Patient has been advised to call insurance company to determine out of pocket expense if they have not yet received this vaccine. Advised may also receive vaccine at local pharmacy or Health Dept. Verbalized acceptance and understanding.  Screening Tests Health Maintenance  Topic Date Due   OPHTHALMOLOGY EXAM  Never done   DEXA SCAN  Never done   COVID-19 Vaccine (3 - Moderna risk series) 03/08/2020   URINE MICROALBUMIN  10/14/2020   INFLUENZA VACCINE  11/28/2020   Zoster Vaccines- Shingrix (1 of 2) 01/12/2021 (Originally 03/16/1965)   TETANUS/TDAP  08/03/2021 (Originally 03/16/1965)   PNA vac Low Risk Adult (1 of 2 - PCV13) 10/12/2021 (Originally 03/15/2009)   HEMOGLOBIN A1C  02/02/2021   FOOT EXAM  08/03/2021   Hepatitis C Screening  Completed   HPV VACCINES  Aged Out    Health Maintenance  Health Maintenance Due  Topic Date Due   OPHTHALMOLOGY EXAM  Never done   DEXA SCAN  Never done   COVID-19  Vaccine (3 - Moderna risk series) 03/08/2020   URINE MICROALBUMIN  10/14/2020   INFLUENZA VACCINE  11/28/2020    Colorectal cancer screening: No longer required.   Mammogram status: No longer required due to AGE.  Bone Density status: Ordered 02/11/2020. Pt provided with contact info and advised to call to schedule appt.  Lung Cancer Screening: (Low Dose CT Chest recommended if Age 34-80 years, 30 pack-year currently smoking OR have quit w/in 15years.) {DOES NOT does:27190::"does not"} qualify.   Lung Cancer Screening Referral: NO  Additional Screening:  Hepatitis C Screening: does qualify; Completed 08/03/2020  Vision Screening: Recommended annual ophthalmology exams for early detection of glaucoma and other disorders of the eye. Is the patient up to date with their annual eye exam?  {YES/NO:21197} Who is the provider or what is the name of the office in which the patient attends annual eye exams? *** If pt is not established with a provider, would they like to be referred to a provider to establish care? {YES/NO:21197}.   Dental Screening: Recommended annual dental exams for proper oral hygiene  Community Resource Referral / Chronic Care Management: CRR required this visit?  {YES/NO:21197}  CCM required this visit?  {YES/NO:21197}     Plan:     I have personally reviewed and noted the following in the patient's chart:   Medical and social history Use of alcohol, tobacco or illicit drugs  Current medications and supplements including opioid prescriptions. {Opioid Prescriptions:[id number]} Functional ability and status Nutritional status Physical activity Advanced directives List of other physicians Hospitalizations, surgeries, and ER visits in previous 12 months Vitals Screenings to include cognitive, depression, and falls Referrals and  appointments  In addition, I have reviewed and discussed with patient certain preventive protocols, quality metrics, and best  practice recommendations. A written personalized care plan for preventive services as well as general preventive health recommendations were provided to patient.     Edgar Frisk, [facility]   01/06/2021   Nurse Notes: Non Face to Face 30 minute visit

## 2021-01-10 DIAGNOSIS — R002 Palpitations: Secondary | ICD-10-CM | POA: Diagnosis not present

## 2021-01-10 DIAGNOSIS — Z8679 Personal history of other diseases of the circulatory system: Secondary | ICD-10-CM

## 2021-01-11 ENCOUNTER — Encounter (HOSPITAL_COMMUNITY): Payer: Medicare Other

## 2021-01-11 ENCOUNTER — Ambulatory Visit (HOSPITAL_COMMUNITY)
Admission: RE | Admit: 2021-01-11 | Discharge: 2021-01-11 | Disposition: A | Discharge status: Home / Self Care | Payer: Medicare Other | Source: Ambulatory Visit | Attending: Cardiology | Admitting: Cardiology

## 2021-01-11 ENCOUNTER — Other Ambulatory Visit: Payer: Self-pay

## 2021-01-11 DIAGNOSIS — I25119 Atherosclerotic heart disease of native coronary artery with unspecified angina pectoris: Secondary | ICD-10-CM | POA: Insufficient documentation

## 2021-01-11 DIAGNOSIS — R079 Chest pain, unspecified: Secondary | ICD-10-CM | POA: Diagnosis not present

## 2021-01-11 LAB — NM MYOCAR MULTI W/SPECT W/WALL MOTION / EF
LV dias vol: 62 mL (ref 46–106)
LV sys vol: 24 mL
Nuc Stress EF: 61 %
Peak HR: 82 {beats}/min
RATE: 0.4
Rest HR: 50 {beats}/min
Rest Nuclear Isotope Dose: 10.1 mCi
SDS: 4
SRS: 5
SSS: 9
ST Depression (mm): 0 mm
Stress Nuclear Isotope Dose: 32 mCi
TID: 1.07

## 2021-01-11 MED ORDER — SODIUM CHLORIDE FLUSH 0.9 % IV SOLN
INTRAVENOUS | Status: AC
  Administered 2021-01-11: 10 mL via INTRAVENOUS
  Filled 2021-01-11: qty 10

## 2021-01-11 MED ORDER — REGADENOSON 0.4 MG/5ML IV SOLN
INTRAVENOUS | Status: AC
  Administered 2021-01-11: 0.4 mg via INTRAVENOUS
  Filled 2021-01-11: qty 5

## 2021-01-11 MED ORDER — TECHNETIUM TC 99M TETROFOSMIN IV KIT
30.0000 | PACK | Freq: Once | INTRAVENOUS | Status: AC | PRN
  Administered 2021-01-11: 32 via INTRAVENOUS

## 2021-01-11 MED ORDER — TECHNETIUM TC 99M TETROFOSMIN IV KIT
10.0000 | PACK | Freq: Once | INTRAVENOUS | Status: AC | PRN
  Administered 2021-01-11: 10.1 via INTRAVENOUS

## 2021-01-12 ENCOUNTER — Telehealth: Payer: Self-pay | Admitting: *Deleted

## 2021-01-12 NOTE — Telephone Encounter (Signed)
Patient informed. Copy sent to PCP °

## 2021-01-12 NOTE — Telephone Encounter (Signed)
-----   Message from Satira Sark, MD sent at 01/11/2021  3:18 PM EDT ----- Results reviewed.  Stress test is mildly abnormal, generally low risk however with normal LVEF and a mild region of mid to basal inferior ischemia.  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  Let's follow-up on the pending cardiac monitor before making any potential adjustments.

## 2021-01-13 ENCOUNTER — Other Ambulatory Visit: Payer: Self-pay | Admitting: Internal Medicine

## 2021-01-13 DIAGNOSIS — T81710D Complication of mesenteric artery following a procedure, not elsewhere classified, subsequent encounter: Secondary | ICD-10-CM

## 2021-01-17 ENCOUNTER — Other Ambulatory Visit: Payer: Self-pay | Admitting: Nephrology

## 2021-01-17 ENCOUNTER — Other Ambulatory Visit (HOSPITAL_COMMUNITY): Payer: Self-pay | Admitting: Nephrology

## 2021-01-17 DIAGNOSIS — I129 Hypertensive chronic kidney disease with stage 1 through stage 4 chronic kidney disease, or unspecified chronic kidney disease: Secondary | ICD-10-CM

## 2021-01-17 DIAGNOSIS — E559 Vitamin D deficiency, unspecified: Secondary | ICD-10-CM

## 2021-01-17 DIAGNOSIS — E1122 Type 2 diabetes mellitus with diabetic chronic kidney disease: Secondary | ICD-10-CM | POA: Diagnosis not present

## 2021-01-17 DIAGNOSIS — N189 Chronic kidney disease, unspecified: Secondary | ICD-10-CM | POA: Diagnosis not present

## 2021-01-17 DIAGNOSIS — I2510 Atherosclerotic heart disease of native coronary artery without angina pectoris: Secondary | ICD-10-CM | POA: Diagnosis not present

## 2021-01-19 DIAGNOSIS — R002 Palpitations: Secondary | ICD-10-CM | POA: Diagnosis not present

## 2021-01-19 DIAGNOSIS — Z8679 Personal history of other diseases of the circulatory system: Secondary | ICD-10-CM | POA: Diagnosis not present

## 2021-01-24 ENCOUNTER — Other Ambulatory Visit: Payer: Self-pay

## 2021-01-24 ENCOUNTER — Ambulatory Visit (HOSPITAL_COMMUNITY)
Admission: RE | Admit: 2021-01-24 | Discharge: 2021-01-24 | Disposition: A | Discharge status: Home / Self Care | Payer: Medicare Other | Source: Ambulatory Visit | Attending: Nephrology | Admitting: Nephrology

## 2021-01-24 DIAGNOSIS — I2510 Atherosclerotic heart disease of native coronary artery without angina pectoris: Secondary | ICD-10-CM | POA: Diagnosis not present

## 2021-01-24 DIAGNOSIS — I129 Hypertensive chronic kidney disease with stage 1 through stage 4 chronic kidney disease, or unspecified chronic kidney disease: Secondary | ICD-10-CM | POA: Diagnosis not present

## 2021-01-24 DIAGNOSIS — Z79899 Other long term (current) drug therapy: Secondary | ICD-10-CM | POA: Diagnosis not present

## 2021-01-24 DIAGNOSIS — E1122 Type 2 diabetes mellitus with diabetic chronic kidney disease: Secondary | ICD-10-CM | POA: Insufficient documentation

## 2021-01-24 DIAGNOSIS — E559 Vitamin D deficiency, unspecified: Secondary | ICD-10-CM | POA: Insufficient documentation

## 2021-01-24 DIAGNOSIS — N189 Chronic kidney disease, unspecified: Secondary | ICD-10-CM | POA: Diagnosis not present

## 2021-01-25 ENCOUNTER — Telehealth: Payer: Self-pay | Admitting: *Deleted

## 2021-01-25 NOTE — Telephone Encounter (Signed)
Patient's granddaughter Joelene Millin informed and verbalized understanding of plan. Copy sent to PCP

## 2021-01-25 NOTE — Telephone Encounter (Signed)
-----   Message from Satira Sark, MD sent at 01/24/2021 10:57 AM EDT ----- Results reviewed.  Cardiac monitor does not show any recurrent atrial fibrillation which is reassuring.  She does have brief episodes of SVT which she may be feeling as palpitations, we will plan to continue present beta-blocker therapy given her low resting heart rate in general.  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  Would focus on medical therapy, if symptoms worsen we could always consider a follow-up cardiac catheterization, realizing that there may not be easily addressable revascularization options.  Please schedule an office follow-up in the next 6 weeks.

## 2021-02-01 ENCOUNTER — Institutional Professional Consult (permissible substitution): Payer: Medicare Other | Admitting: Pulmonary Disease

## 2021-02-03 ENCOUNTER — Other Ambulatory Visit: Payer: Self-pay | Admitting: Internal Medicine

## 2021-02-03 DIAGNOSIS — Z780 Asymptomatic menopausal state: Secondary | ICD-10-CM

## 2021-02-06 ENCOUNTER — Other Ambulatory Visit: Payer: Self-pay | Admitting: Nurse Practitioner

## 2021-02-06 DIAGNOSIS — F419 Anxiety disorder, unspecified: Secondary | ICD-10-CM

## 2021-02-06 NOTE — Telephone Encounter (Signed)
Do you want her to get a refill? Last OV was 8/11

## 2021-02-22 DIAGNOSIS — I10 Essential (primary) hypertension: Secondary | ICD-10-CM | POA: Diagnosis not present

## 2021-02-22 DIAGNOSIS — J449 Chronic obstructive pulmonary disease, unspecified: Secondary | ICD-10-CM | POA: Diagnosis not present

## 2021-02-22 DIAGNOSIS — E559 Vitamin D deficiency, unspecified: Secondary | ICD-10-CM | POA: Diagnosis not present

## 2021-02-22 DIAGNOSIS — E211 Secondary hyperparathyroidism, not elsewhere classified: Secondary | ICD-10-CM | POA: Diagnosis not present

## 2021-02-22 DIAGNOSIS — E1100 Type 2 diabetes mellitus with hyperosmolarity without nonketotic hyperglycemic-hyperosmolar coma (NKHHC): Secondary | ICD-10-CM | POA: Diagnosis not present

## 2021-02-22 DIAGNOSIS — R768 Other specified abnormal immunological findings in serum: Secondary | ICD-10-CM | POA: Diagnosis not present

## 2021-02-22 DIAGNOSIS — N189 Chronic kidney disease, unspecified: Secondary | ICD-10-CM | POA: Diagnosis not present

## 2021-02-22 DIAGNOSIS — E1122 Type 2 diabetes mellitus with diabetic chronic kidney disease: Secondary | ICD-10-CM | POA: Diagnosis not present

## 2021-02-22 DIAGNOSIS — I129 Hypertensive chronic kidney disease with stage 1 through stage 4 chronic kidney disease, or unspecified chronic kidney disease: Secondary | ICD-10-CM | POA: Diagnosis not present

## 2021-02-23 ENCOUNTER — Ambulatory Visit: Payer: Medicare Other

## 2021-02-23 ENCOUNTER — Other Ambulatory Visit: Payer: Self-pay

## 2021-02-23 ENCOUNTER — Encounter (INDEPENDENT_AMBULATORY_CARE_PROVIDER_SITE_OTHER): Payer: Self-pay

## 2021-02-24 ENCOUNTER — Other Ambulatory Visit: Payer: Self-pay | Admitting: Internal Medicine

## 2021-02-24 ENCOUNTER — Telehealth: Payer: Self-pay | Admitting: Internal Medicine

## 2021-02-24 DIAGNOSIS — G4700 Insomnia, unspecified: Secondary | ICD-10-CM

## 2021-02-24 DIAGNOSIS — F419 Anxiety disorder, unspecified: Secondary | ICD-10-CM

## 2021-02-24 MED ORDER — ZOLPIDEM TARTRATE 10 MG PO TABS: 10.0000 mg | ORAL_TABLET | Freq: Every evening | ORAL | 2 refills | Status: DC | PRN

## 2021-02-24 MED ORDER — ALPRAZOLAM 0.5 MG PO TABS: 0.5000 mg | ORAL_TABLET | Freq: Two times a day (BID) | ORAL | 2 refills | Status: DC | PRN

## 2021-02-24 NOTE — Telephone Encounter (Signed)
Pt needs refills on  ZOLPIDEM & ALPRAZOLAM

## 2021-02-24 NOTE — Telephone Encounter (Signed)
Pt daughter Joelene Millin advised with verbal understanding

## 2021-02-27 ENCOUNTER — Other Ambulatory Visit: Payer: Self-pay

## 2021-02-27 ENCOUNTER — Ambulatory Visit (INDEPENDENT_AMBULATORY_CARE_PROVIDER_SITE_OTHER): Payer: Medicare Other

## 2021-02-27 DIAGNOSIS — Z0000 Encounter for general adult medical examination without abnormal findings: Secondary | ICD-10-CM

## 2021-02-27 NOTE — Patient Instructions (Signed)
[name] , Thank you for taking time to come for your Medicare Wellness Visit. I appreciate your ongoing commitment to your health goals. Please review the following plan we discussed and let me know if I can assist you in the future.   Screening recommendations/referrals: Colonoscopy: patient declined Mammogram: patient declined  Bone Density: patient declined       Recommended yearly ophthalmology/optometry visit for glaucoma screening and checkup Recommended yearly dental visit for hygiene and checkup  Vaccinations: Influenza vaccine: due now  Pneumococcal vaccine: due now Tdap vaccine: due now Shingles vaccine: due now     Advanced directives: declined   Conditions/risks identified: hypertension, diabetes   Next appointment: 1 year    Preventive Care 42 Years and Older, Female Preventive care refers to lifestyle choices and visits with your health care provider that can promote health and wellness. What does preventive care include? A yearly physical exam. This is also called an annual well check. Dental exams once or twice a year. Routine eye exams. Ask your health care provider how often you should have your eyes checked. Personal lifestyle choices, including: Daily care of your teeth and gums. Regular physical activity. Eating a healthy diet. Avoiding tobacco and drug use. Limiting alcohol use. Practicing safe sex. Taking low-dose aspirin every day. Taking vitamin and mineral supplements as recommended by your health care provider. What happens during an annual well check? The services and screenings done by your health care provider during your annual well check will depend on your age, overall health, lifestyle risk factors, and family history of disease. Counseling  Your health care provider may ask you questions about your: Alcohol use. Tobacco use. Drug use. Emotional well-being. Home and relationship well-being. Sexual activity. Eating habits. History  of falls. Memory and ability to understand (cognition). Work and work Statistician. Reproductive health. Screening  You may have the following tests or measurements: Height, weight, and BMI. Blood pressure. Lipid and cholesterol levels. These may be checked every 5 years, or more frequently if you are over 39 years old. Skin check. Lung cancer screening. You may have this screening every year starting at age 74 if you have a 30-pack-year history of smoking and currently smoke or have quit within the past 15 years. Fecal occult blood test (FOBT) of the stool. You may have this test every year starting at age 58. Flexible sigmoidoscopy or colonoscopy. You may have a sigmoidoscopy every 5 years or a colonoscopy every 10 years starting at age 60. Hepatitis C blood test. Hepatitis B blood test. Sexually transmitted disease (STD) testing. Diabetes screening. This is done by checking your blood sugar (glucose) after you have not eaten for a while (fasting). You may have this done every 1-3 years. Bone density scan. This is done to screen for osteoporosis. You may have this done starting at age 27. Mammogram. This may be done every 1-2 years. Talk to your health care provider about how often you should have regular mammograms. Talk with your health care provider about your test results, treatment options, and if necessary, the need for more tests. Vaccines  Your health care provider may recommend certain vaccines, such as: Influenza vaccine. This is recommended every year. Tetanus, diphtheria, and acellular pertussis (Tdap, Td) vaccine. You may need a Td booster every 10 years. Zoster vaccine. You may need this after age 28. Pneumococcal 13-valent conjugate (PCV13) vaccine. One dose is recommended after age 72. Pneumococcal polysaccharide (PPSV23) vaccine. One dose is recommended after age 23. Talk to  your health care provider about which screenings and vaccines you need and how often you need  them. This information is not intended to replace advice given to you by your health care provider. Make sure you discuss any questions you have with your health care provider. Document Released: 05/13/2015 Document Revised: 01/04/2016 Document Reviewed: 02/15/2015 Elsevier Interactive Patient Education  2017 [place] Prevention in the Home Falls can cause injuries. They can happen to people of all ages. There are many things you can do to make your home safe and to help prevent falls. What can I do on the outside of my home? Regularly fix the edges of walkways and driveways and fix any cracks. Remove anything that might make you trip as you walk through a door, such as a raised step or threshold. Trim any bushes or trees on the path to your home. Use bright outdoor lighting. Clear any walking paths of anything that might make someone trip, such as rocks or tools. Regularly check to see if handrails are loose or broken. Make sure that both sides of any steps have handrails. Any raised decks and porches should have guardrails on the edges. Have any leaves, snow, or ice cleared regularly. Use sand or salt on walking paths during winter. Clean up any spills in your garage right away. This includes oil or grease spills. What can I do in the bathroom? Use night lights. Install grab bars by the toilet and in the tub and shower. Do not use towel bars as grab bars. Use non-skid mats or decals in the tub or shower. If you need to sit down in the shower, use a plastic, non-slip stool. Keep the floor dry. Clean up any water that spills on the floor as soon as it happens. Remove soap buildup in the tub or shower regularly. Attach bath mats securely with double-sided non-slip rug tape. Do not have throw rugs and other things on the floor that can make you trip. What can I do in the bedroom? Use night lights. Make sure that you have a light by your bed that is easy to reach. Do not use  any sheets or blankets that are too big for your bed. They should not hang down onto the floor. Have a firm chair that has side arms. You can use this for support while you get dressed. Do not have throw rugs and other things on the floor that can make you trip. What can I do in the kitchen? Clean up any spills right away. Avoid walking on wet floors. Keep items that you use a lot in easy-to-reach places. If you need to reach something above you, use a strong step stool that has a grab bar. Keep electrical cords out of the way. Do not use floor polish or wax that makes floors slippery. If you must use wax, use non-skid floor wax. Do not have throw rugs and other things on the floor that can make you trip. What can I do with my stairs? Do not leave any items on the stairs. Make sure that there are handrails on both sides of the stairs and use them. Fix handrails that are broken or loose. Make sure that handrails are as long as the stairways. Check any carpeting to make sure that it is firmly attached to the stairs. Fix any carpet that is loose or worn. Avoid having throw rugs at the top or bottom of the stairs. If you do have throw rugs, attach  them to the floor with carpet tape. Make sure that you have a light switch at the top of the stairs and the bottom of the stairs. If you do not have them, ask someone to add them for you. What else can I do to help prevent falls? Wear shoes that: Do not have high heels. Have rubber bottoms. Are comfortable and fit you well. Are closed at the toe. Do not wear sandals. If you use a stepladder: Make sure that it is fully opened. Do not climb a closed stepladder. Make sure that both sides of the stepladder are locked into place. Ask someone to hold it for you, if possible. Clearly mark and make sure that you can see: Any grab bars or handrails. First and last steps. Where the edge of each step is. Use tools that help you move around (mobility aids)  if they are needed. These include: Canes. Walkers. Scooters. Crutches. Turn on the lights when you go into a dark area. Replace any light bulbs as soon as they burn out. Set up your furniture so you have a clear path. Avoid moving your furniture around. If any of your floors are uneven, fix them. If there are any pets around you, be aware of where they are. Review your medicines with your doctor. Some medicines can make you feel dizzy. This can increase your chance of falling. Ask your doctor what other things that you can do to help prevent falls. This information is not intended to replace advice given to you by your health care provider. Make sure you discuss any questions you have with your health care provider. Document Released: 02/10/2009 Document Revised: 09/22/2015 Document Reviewed: 05/21/2014 Elsevier Interactive Patient Education  2017 [employer].

## 2021-02-27 NOTE — Progress Notes (Signed)
Subjective:   [name] is a 75 y.o. female who presents for Medicare Annual (Subsequent) preventive examination.  I connected with  [name] on 02/27/21 by a audio enabled telemedicine application and verified that I am speaking with the correct person using two identifiers.   I discussed the limitations, risks, security and privacy concerns of performing an evaluation and management service by telephone and the availability of in person appointments. I also discussed with the patient that there may be a patient responsible charge related to this service. The patient expressed understanding and verbally consented to this telephonic visit.   Review of Systems           Objective:    There were no vitals filed for this visit. There is no height or weight on file to calculate BMI.  Advanced Directives 09/27/2020 05/07/2020 09/29/2019 09/29/2019 08/18/2013 04/24/2012 04/14/2012  Does Patient Have a Medical Advance Directive? No No No No Patient does not have advance directive;Patient would not like information Patient does not have advance directive Patient does not have advance directive;Patient would not like information  Would patient like information on creating a medical advance directive? No - Patient declined - No - Patient declined - - - -  Pre-existing out of facility DNR order (yellow form or pink MOST form) - - - - No No No    Current Medications (verified) Outpatient Encounter Medications as of 02/27/2021  Medication Sig   albuterol (VENTOLIN HFA) 108 (90 Base) MCG/ACT inhaler USE 2 INHALATIONS BY MOUTH  EVERY 6 HOURS AS NEEDED FOR WHEEZING OR SHORTNESS OF  BREATH   [START ON 03/08/2021] ALPRAZolam (XANAX) 0.5 MG tablet Take 1 tablet (0.5 mg total) by mouth 2 (two) times daily as needed for anxiety. for anxiety   aspirin EC 81 MG tablet Take 81 mg by mouth daily.   clopidogrel (PLAVIX) 75 MG tablet Take 1 tablet (75 mg total) by mouth daily.   fluticasone (FLOVENT  HFA) 110 MCG/ACT inhaler Inhale 1 puff into the lungs in the morning and at bedtime.   gabapentin (NEURONTIN) 300 MG capsule TAKE 1 CAPSULE BY MOUTH  TWICE DAILY   isosorbide mononitrate (IMDUR) 60 MG 24 hr tablet Take 1 tablet (60 mg total) by mouth daily.   levothyroxine (SYNTHROID) 50 MCG tablet TAKE 1 TABLET BY MOUTH  DAILY BEFORE BREAKFAST   metoprolol succinate (TOPROL XL) 25 MG 24 hr tablet Take 1 tablet (25 mg total) by mouth daily.   nitroGLYCERIN (NITROSTAT) 0.4 MG SL tablet Place 1 tablet (0.4 mg total) under the tongue every 5 (five) minutes as needed.   pravastatin (PRAVACHOL) 80 MG tablet TAKE 1 TABLET BY MOUTH  DAILY   tiZANidine (ZANAFLEX) 4 MG tablet Take 1 tablet (4 mg total) by mouth every 6 (six) hours as needed for muscle spasms.   torsemide (DEMADEX) 20 MG tablet Take 2 tablets (40 mg total) by mouth daily.   zolpidem (AMBIEN) 10 MG tablet Take 1 tablet (10 mg total) by mouth at bedtime as needed for sleep.   No facility-administered encounter medications on file as of 02/27/2021.    Allergies (verified) Patient has no known allergies.   History: Past Medical History:  Diagnosis Date   Acute ischemic colitis ([place]) 09/11/2005   Anxiety    Arthritis    Atherosclerotic vascular disease    Calcified plaque at the origin of the celiac and SMA   CHF (congestive heart failure) (HCC)    Coronary atherosclerosis of  native coronary artery    Mulitvessel LVEF 50-50%, DES circ 1/11, occluded SVG to OM and SVG to diagonal , LVEF 60%   COVID-19 virus infection 11/07/2020   Depression    Diverticulosis of colon    Essential hypertension    Hypothyroidism    Mesenteric ischemia (HCC)    Mitral regurgitation    Mild to moderate   Mixed hyperlipidemia    Myocardial infarction ([place]) 1990   Obesity    Orthostatic hypotension    PAD (peripheral artery disease) (HCC)    PVC's (premature ventricular contractions)    Schizophrenia ([place])    SINUS BRADYCARDIA 10/13/2009    Qualifier: Diagnosis of  By: Patterson Hammersmith    Sleep apnea    CPAP   TIA (transient ischemic attack) 11/20/2017   Tubular adenoma    Type 2 diabetes mellitus (HCC)    Vascular complications of mesenteric artery    Past Surgical History:  Procedure Laterality Date   BACK SURGERY     Lumbar spine surgery   Carotid endarectomy Bilateral    CARPAL TUNNEL RELEASE     Bilateral   CATARACT EXTRACTION W/PHACO Right 08/24/2013   Procedure: CATARACT EXTRACTION PHACO AND INTRAOCULAR LENS PLACEMENT ([place]);  Surgeon: Tonny Branch, MD;  Location: [facility];  Service: Ophthalmology;  Laterality: Right;  CDE 8.68   COLONOSCOPY  09/14/2005   Dr. Leonard Schwartz colitis   COLONOSCOPY WITH ESOPHAGOGASTRODUODENOSCOPY (EGD)  04/14/2012   Dr. Gala Romney- EGD= gastric erosions of doubtful clinical significance per bx- chronic inactive gastritis, benign small bowel mucosa. TCS=colonic diverticulosis, tubular adenoma   CORONARY ARTERY BYPASS GRAFT     LIMA-LAD; SVG-OM; SVG-DX in [place]     Cervical laminectomy   PARTIAL HYSTERECTOMY     stents x4     Family History  Problem Relation Age of Onset   Alcohol abuse Mother    Alcohol abuse Father    Coronary artery disease Other    Hypertension Other    Crohn's disease Sister 20   Stroke Daughter    Social History   Socioeconomic History   Marital status: Widowed    Spouse name: Not on file   Number of children: 4   Years of education: Not on file   Highest education level: Not on file  Occupational History   Occupation: DISABLED    Employer: UNEMPLOYED  Tobacco Use   Smoking status: Light Smoker    Packs/day: 0.50    Years: 50.00    Pack years: 25.00    Types: Cigarettes    Start date: 12/30/2018   Smokeless tobacco: Never  Vaping Use   Vaping Use: Never used  Substance and Sexual Activity   Alcohol use: Yes    Alcohol/week: 0.0 standard drinks    Comment: occasionally    Drug use: No   Sexual activity: Not on file   Other Topics Concern   Not on file  Social History Narrative   LIves alone   Social Determinants of Health   Financial Resource Strain: Not on file  Food Insecurity: Not on file  Transportation Needs: Not on file  Physical Activity: Not on file  Stress: Not on file  Social Connections: Not on file    Tobacco Counseling Ready to quit: Not Answered Counseling given: Not Answered   Clinical Intake:                 Diabetic?Nutrition Risk Assessment:  Has the patient had any N/V/D  within the last 2 months?  No  Does the patient have any non-healing wounds?  No  Has the patient had any unintentional weight loss or weight gain?  No   Diabetes:  Is the patient diabetic?  Yes  If diabetic, was a CBG obtained today?  No  Did the patient bring in their glucometer from home?  No  How often do you monitor your CBG's? Once a day .   Financial Strains and Diabetes Management:  Are you having any financial strains with the device, your supplies or your medication? No .  Does the patient want to be seen by Chronic Care Management for management of their diabetes?  No  Would the patient like to be referred to a Nutritionist or for Diabetic Management?  No   Diabetic Exams:  Diabetic Eye Exam: Completed patient declined . Overdue for diabetic eye exam. Pt has been advised about the importance in completing this exam. A referral has been placed today. Message sent to referral coordinator for scheduling purposes. Advised pt to expect a call from office referred to regarding appt.  Diabetic Foot Exam: Completed 08/03/2020. Pt has been advised about the importance in completing this exam.           Activities of Daily Living In your present state of health, do you have any difficulty performing the following activities: 12/08/2020  Hearing? N  Vision? N  Difficulty concentrating or making decisions? N  Walking or climbing stairs? Y  Dressing or bathing? N  Doing  errands, shopping? N  Some recent data might be hidden    Patient Care Team: Lindell Spar, MD as PCP - General (Internal Medicine) Satira Sark, MD as PCP - Cardiology (Cardiology) Gala Romney Cristopher Estimable, MD as Attending Physician (Gastroenterology)  Indicate any recent Medical Services you may have received from other than [facility] providers in the past year (date may be approximate).     Assessment:   This is a routine wellness examination for [name].  Hearing/Vision screen No results found.  Dietary issues and exercise activities discussed:     Goals Addressed   None   Depression Screen PHQ 2/9 Scores 12/08/2020 11/07/2020 10/20/2020 10/12/2020 10/05/2020 08/03/2020 05/13/2020  PHQ - 2 Score 3 0 1 1 1 2 2   PHQ- 9 Score 17 - - - - 11 9    Fall Risk Fall Risk  12/08/2020 11/07/2020 10/20/2020 10/12/2020 10/05/2020  Falls in the past year? 0 0 1 1 1   Number falls in past yr: 0 0 0 1 0  Injury with Fall? 0 0 0 0 0  Risk for fall due to : No Fall Risks No Fall Risks No Fall Risks No Fall Risks Impaired balance/gait  Follow up Falls evaluation completed Falls evaluation completed Falls evaluation completed Falls evaluation completed Falls evaluation completed    FALL RISK PREVENTION PERTAINING TO THE HOME:  Any stairs in or around the home? Yes  If so, are there any without handrails? Yes  Home free of loose throw rugs in walkways, pet beds, electrical cords, etc? No  Adequate lighting in your home to reduce risk of falls? Yes   ASSISTIVE DEVICES UTILIZED TO PREVENT FALLS:  Life alert? No  Use of a cane, walker or w/c? No  Grab bars in the bathroom? Yes  Shower chair or bench in shower? No  Elevated toilet seat or a handicapped toilet? Yes   TIMED UP AND GO:  Was the test performed? No .  Length of time to ambulate 10 feet: n/a sec.     Cognitive Function:        Immunizations Immunization History  Administered Date(s) Administered   Fluad Quad(high Dose 65+)  02/09/2020   Influenza Split 04/10/2010   Moderna Sars-Covid-2 Vaccination 12/15/2019, 02/09/2020    TDAP status: Due, Education has been provided regarding the importance of this vaccine. Advised may receive this vaccine at local pharmacy or Health Dept. Aware to provide a copy of the vaccination record if obtained from local pharmacy or Health Dept. Verbalized acceptance and understanding.  Flu Vaccine status: Due, Education has been provided regarding the importance of this vaccine. Advised may receive this vaccine at local pharmacy or Health Dept. Aware to provide a copy of the vaccination record if obtained from local pharmacy or Health Dept. Verbalized acceptance and understanding.  Pneumococcal vaccine status: Due, Education has been provided regarding the importance of this vaccine. Advised may receive this vaccine at local pharmacy or Health Dept. Aware to provide a copy of the vaccination record if obtained from local pharmacy or Health Dept. Verbalized acceptance and understanding.  Covid-19 vaccine status: Completed vaccines  Qualifies for Shingles Vaccine? Yes   Zostavax completed No   Shingrix Completed?: No.    Education has been provided regarding the importance of this vaccine. Patient has been advised to call insurance company to determine out of pocket expense if they have not yet received this vaccine. Advised may also receive vaccine at local pharmacy or Health Dept. Verbalized acceptance and understanding.  Screening Tests Health Maintenance  Topic Date Due   Pneumonia Vaccine 27+ Years old (1 - PCV) Never done   OPHTHALMOLOGY EXAM  Never done   Zoster Vaccines- Shingrix (1 of 2) Never done   DEXA SCAN  Never done   COVID-19 Vaccine (3 - Moderna risk series) 03/08/2020   URINE MICROALBUMIN  10/14/2020   INFLUENZA VACCINE  11/28/2020   HEMOGLOBIN A1C  02/02/2021   TETANUS/TDAP  08/03/2021 (Originally 03/16/1965)   FOOT EXAM  08/03/2021   Hepatitis C Screening   Completed   HPV VACCINES  Aged Out    Health Maintenance  Health Maintenance Due  Topic Date Due   Pneumonia Vaccine 29+ Years old (1 - PCV) Never done   OPHTHALMOLOGY EXAM  Never done   Zoster Vaccines- Shingrix (1 of 2) Never done   DEXA SCAN  Never done   COVID-19 Vaccine (3 - Moderna risk series) 03/08/2020   URINE MICROALBUMIN  10/14/2020   INFLUENZA VACCINE  11/28/2020   HEMOGLOBIN A1C  02/02/2021    Colorectal cancer screening: No longer required.   Mammogram status: Ordered patient declined. Pt provided with contact info and advised to call to schedule appt.   Bone Density status: Ordered patient declined. Pt provided with contact info and advised to call to schedule appt.  Lung Cancer Screening: (Low Dose CT Chest recommended if Age 32-80 years, 30 pack-year currently smoking OR have quit w/in 15years.) does qualify.   Lung Cancer Screening Referral: yes  Additional Screening:  Hepatitis C Screening: does qualify; Completed 08/03/2020  Vision Screening: Recommended annual ophthalmology exams for early detection of glaucoma and other disorders of the eye. Is the patient up to date with their annual eye exam?  No  Who is the provider or what is the name of the office in which the patient attends annual eye exams? Patient declined If pt is not established with a provider, would they like to be referred to  a provider to establish care? No .   Dental Screening: Recommended annual dental exams for proper oral hygiene  Community Resource Referral / Chronic Care Management: CRR required this visit?  No   CCM required this visit?  No      Plan:     I have personally reviewed and noted the following in the patient's chart:   Medical and social history Use of alcohol, tobacco or illicit drugs  Current medications and supplements including opioid prescriptions.  Functional ability and status Nutritional status Physical activity Advanced directives List of other  physicians Hospitalizations, surgeries, and ER visits in previous 12 months Vitals Screenings to include cognitive, depression, and falls Referrals and appointments  In addition, I have reviewed and discussed with patient certain preventive protocols, quality metrics, and best practice recommendations. A written personalized care plan for preventive services as well as general preventive health recommendations were provided to patient.     Quentin Angst, [place]   02/27/2021   Nurse Notes: this is a tele health visit. The patient was at home and provider was in the office and was rutwik patel,MD

## 2021-03-07 ENCOUNTER — Inpatient Hospital Stay (HOSPITAL_COMMUNITY): Admission: RE | Admit: 2021-03-07 | Payer: Medicare Other | Source: Ambulatory Visit

## 2021-03-10 ENCOUNTER — Ambulatory Visit (INDEPENDENT_AMBULATORY_CARE_PROVIDER_SITE_OTHER): Payer: Medicare Other | Admitting: Family Medicine

## 2021-03-10 ENCOUNTER — Ambulatory Visit (HOSPITAL_COMMUNITY)
Admission: RE | Admit: 2021-03-10 | Discharge: 2021-03-10 | Disposition: A | Discharge status: Home / Self Care | Payer: Medicare Other | Source: Ambulatory Visit | Attending: Family Medicine | Admitting: Family Medicine

## 2021-03-10 ENCOUNTER — Encounter: Payer: Self-pay | Admitting: Family Medicine

## 2021-03-10 ENCOUNTER — Other Ambulatory Visit: Payer: Self-pay

## 2021-03-10 VITALS — BP 136/70 | HR 67 | Resp 18 | Ht 64.0 in | Wt 230.1 lb

## 2021-03-10 DIAGNOSIS — R1012 Left upper quadrant pain: Secondary | ICD-10-CM | POA: Insufficient documentation

## 2021-03-10 DIAGNOSIS — E1169 Type 2 diabetes mellitus with other specified complication: Secondary | ICD-10-CM

## 2021-03-10 DIAGNOSIS — Z23 Encounter for immunization: Secondary | ICD-10-CM | POA: Diagnosis not present

## 2021-03-10 DIAGNOSIS — K529 Noninfective gastroenteritis and colitis, unspecified: Secondary | ICD-10-CM | POA: Diagnosis not present

## 2021-03-10 DIAGNOSIS — E785 Hyperlipidemia, unspecified: Secondary | ICD-10-CM | POA: Diagnosis not present

## 2021-03-10 DIAGNOSIS — I10 Essential (primary) hypertension: Secondary | ICD-10-CM

## 2021-03-10 DIAGNOSIS — R109 Unspecified abdominal pain: Secondary | ICD-10-CM | POA: Insufficient documentation

## 2021-03-10 MED ORDER — HYOSCYAMINE SULFATE ER 0.375 MG PO TB12: ORAL_TABLET | ORAL | 0 refills | Status: DC

## 2021-03-10 MED ORDER — DIPHENOXYLATE-ATROPINE 2.5-0.025 MG PO TABS: ORAL_TABLET | ORAL | 0 refills | Status: DC

## 2021-03-10 NOTE — Patient Instructions (Signed)
F/u with Dr Posey Pronto in 1 to 3 weeks, re evaluate abdominal pain, call if you need to be seen sooner  Please get an abdominal x-ray today when you leave.  Labs today CBC and CMP and EGFR hemoglobin A1c.  Medication is sent in for symptoms Lomotil and Levbid.  If your abdominal pain worsens you will need to go to the emergency room.  Please take container for collecting stool samples to be returned for [place] testing for bacterial infection.( Stool test for c/s)  Pneumonia 20 vaccine today.  Thanks for choosing [facility], we consider it a privelige to serve you.

## 2021-03-11 LAB — CBC
Hematocrit: 39.2 % (ref 34.0–46.6)
Hemoglobin: 12.9 g/dL (ref 11.1–15.9)
MCH: 31 pg (ref 26.6–33.0)
MCHC: 32.9 g/dL (ref 31.5–35.7)
MCV: 94 fL (ref 79–97)
Platelets: 183 10*3/uL (ref 150–450)
RBC: 4.16 x10E6/uL (ref 3.77–5.28)
RDW: 13 % (ref 11.7–15.4)
WBC: 6.1 10*3/uL (ref 3.4–10.8)

## 2021-03-11 LAB — HEMOGLOBIN A1C
Est. average glucose Bld gHb Est-mCnc: 134 mg/dL
Hgb A1c MFr Bld: 6.3 % — ABNORMAL HIGH (ref 4.8–5.6)

## 2021-03-11 LAB — CMP14+EGFR
ALT: 10 IU/L (ref 0–32)
AST: 14 IU/L (ref 0–40)
Albumin/Globulin Ratio: 1.6 (ref 1.2–2.2)
Albumin: 4 g/dL (ref 3.7–4.7)
Alkaline Phosphatase: 117 IU/L (ref 44–121)
BUN/Creatinine Ratio: 15 (ref 12–28)
BUN: 17 mg/dL (ref 8–27)
Bilirubin Total: 0.5 mg/dL (ref 0.0–1.2)
CO2: 22 mmol/L (ref 20–29)
Calcium: 9.1 mg/dL (ref 8.7–10.3)
Chloride: 105 mmol/L (ref 96–106)
Creatinine, Ser: 1.14 mg/dL — ABNORMAL HIGH (ref 0.57–1.00)
Globulin, Total: 2.5 g/dL (ref 1.5–4.5)
Glucose: 115 mg/dL — ABNORMAL HIGH (ref 70–99)
Potassium: 4.6 mmol/L (ref 3.5–5.2)
Sodium: 142 mmol/L (ref 134–144)
Total Protein: 6.5 g/dL (ref 6.0–8.5)
eGFR: 50 mL/min/{1.73_m2} — ABNORMAL LOW (ref >59)

## 2021-03-12 ENCOUNTER — Encounter: Payer: Self-pay | Admitting: Family Medicine

## 2021-03-12 NOTE — Assessment & Plan Note (Signed)
  Patient re-educated about  the importance of commitment to a  minimum of 150 minutes of exercise per week as able.  The importance of healthy food choices with portion control discussed, as well as eating regularly and within a 12 hour window most days. The need to choose "clean , green" food 50 to 75% of the time is discussed, as well as to make water the primary drink and set a goal of 64 ounces water daily.    Weight /BMI 03/10/2021 01/03/2021 12/08/2020  WEIGHT 230 lb 1.3 oz 238 lb 239 lb  HEIGHT 5\' 4"  5\' 3"  5\' 3"   BMI 39.49 kg/m2 42.16 kg/m2 42.34 kg/m2  Some encounter information is confidential and restricted. Go to Review Flowsheets activity to see all data.  improved

## 2021-03-12 NOTE — Progress Notes (Signed)
[name]     MRN: [id number]      DOB: 08/18/1945   HPI [name] is here with a 4 day h/o increased left abdominal pain, present for months.c/o diarrhea and abdominal cramping pain, denies fever or chills, states her grand kids which she has not had close contact with are having GI symptoms also Denies vomiting, has intermittent mild nausea  ROS Denies recent fever or chills. Denies sinus pressure, nasal congestion, ear pain or sore throat. Denies chest congestion, productive cough or wheezing. Denies chest pains, palpitations and leg swelling .   Denies dysuria, frequency, hesitancy or incontinence. Chronic  joint pain, and limitation in mobility. Denies headaches, seizures, numbness, or tingling.    PE  BP 136/70   Pulse 67   Resp 18   Ht 5\' 4"  (1.626 m)   Wt 230 lb 1.3 oz (104.4 kg)   SpO2 94%   BMI 39.49 kg/m    Patient alert and oriented and in no cardiopulmonary distress.  HEENT: No facial asymmetry, EOMI,     Neck supple .  Chest: Clear to auscultation bilaterally.  CVS: S1, S2 systolic  murmur, no S3.Regular rate.  ABD: Soft superficial LUQ tenderness, no guarding or rebound, normal BS  Ext: No edema  XB:[id number]  ROM spine, shoulders, hips and knees.  Skin: Intact, no ulcerations or rash noted.  Psych: Good eye contact, normal affect. Memory intact anxious not  depressed appearing.  CNS: CN 2-12 intact, power,  normal throughout.no focal deficits noted.   Assessment & Plan  Abdominal pain LUQ pain, obtain X ray abdomen, treat symptomatically with lomotil as needed and levbid, stool to be sent for c/s  DM2 (diabetes mellitus, type 2) ([place]) [name] is reminded of the importance of commitment to daily physical activity for 30 minutes or more, as able and the need to limit carbohydrate intake to 30 to 60 grams per meal to help with blood sugar control.   The need to take medication as prescribed, test blood sugar as directed, and to  call between visits if there is a concern that blood sugar is uncontrolled is also discussed.   [name] is reminded of the importance of daily foot exam, annual eye examination, and good blood sugar, blood pressure and cholesterol control. improved  Diabetic Labs Latest Ref Rng & Units 03/10/2021 11/30/2020 08/03/2020 06/30/2020 06/14/2020  HbA1c 4.8 - 5.6 % 6.3(H) - 6.7(H) - -  Microalbumin mg/dL - - - - -  Chol 100 - 199 mg/dL - - - - -  HDL >39 mg/dL - - - - -  Calc LDL 0 - 99 mg/dL - - - - -  Triglycerides 0 - 149 mg/dL - - - - -  Creatinine 0.57 - 1.00 mg/dL 1.14(H) 1.13(H) - 1.24(H) 1.34(H)   BP/Weight 03/10/2021 01/03/2021 12/08/2020 10/12/2020 10/05/2020 09/30/2020 05/02/7251  Systolic BP 664 403 474 - 259 563 875  Diastolic BP 69 80 74 - 61 62 49  Wt. (Lbs) 230.08 238 239 243 239 242 -  BMI 39.49 42.16 42.34 43.05 42.34 41.54 -  Some encounter information is confidential and restricted. Go to Review Flowsheets activity to see all data.   Foot/eye exam completion dates 08/03/2020  Foot Form Completion Done        Benign essential hypertension Controlled, no change in medication   Morbid obesity ([place])  Patient re-educated about  the importance of commitment to a  minimum of 150 minutes of exercise per week  as able.  The importance of healthy food choices with portion control discussed, as well as eating regularly and within a 12 hour window most days. The need to choose "clean , green" food 50 to 75% of the time is discussed, as well as to make water the primary drink and set a goal of 64 ounces water daily.    Weight /BMI 03/10/2021 01/03/2021 12/08/2020  WEIGHT 230 lb 1.3 oz 238 lb 239 lb  HEIGHT 5\' 4"  5\' 3"  5\' 3"   BMI 39.49 kg/m2 42.16 kg/m2 42.34 kg/m2  Some encounter information is confidential and restricted. Go to Review Flowsheets activity to see all data.  improved

## 2021-03-12 NOTE — Assessment & Plan Note (Signed)
Controlled, no change in medication  

## 2021-03-12 NOTE — Assessment & Plan Note (Signed)
[name] is reminded of the importance of commitment to daily physical activity for 30 minutes or more, as able and the need to limit carbohydrate intake to 30 to 60 grams per meal to help with blood sugar control.   The need to take medication as prescribed, test blood sugar as directed, and to call between visits if there is a concern that blood sugar is uncontrolled is also discussed.   [name] is reminded of the importance of daily foot exam, annual eye examination, and good blood sugar, blood pressure and cholesterol control. improved  Diabetic Labs Latest Ref Rng & Units 03/10/2021 11/30/2020 08/03/2020 06/30/2020 06/14/2020  HbA1c 4.8 - 5.6 % 6.3(H) - 6.7(H) - -  Microalbumin mg/dL - - - - -  Chol 100 - 199 mg/dL - - - - -  HDL >39 mg/dL - - - - -  Calc LDL 0 - 99 mg/dL - - - - -  Triglycerides 0 - 149 mg/dL - - - - -  Creatinine 0.57 - 1.00 mg/dL 1.14(H) 1.13(H) - 1.24(H) 1.34(H)   BP/Weight 03/10/2021 01/03/2021 12/08/2020 10/12/2020 10/05/2020 09/30/2020 2/70/6237  Systolic BP 628 315 176 - 160 737 106  Diastolic BP 69 80 74 - 61 62 49  Wt. (Lbs) 230.08 238 239 243 239 242 -  BMI 39.49 42.16 42.34 43.05 42.34 41.54 -  Some encounter information is confidential and restricted. Go to Review Flowsheets activity to see all data.   Foot/eye exam completion dates 08/03/2020  Foot Form Completion Done

## 2021-03-12 NOTE — Assessment & Plan Note (Signed)
LUQ pain, obtain X ray abdomen, treat symptomatically with lomotil as needed and levbid, stool to be sent for c/s

## 2021-03-13 ENCOUNTER — Emergency Department (HOSPITAL_COMMUNITY): Payer: Medicare Other

## 2021-03-13 ENCOUNTER — Other Ambulatory Visit: Payer: Self-pay

## 2021-03-13 ENCOUNTER — Emergency Department (HOSPITAL_COMMUNITY)
Admission: EM | Admit: 2021-03-13 | Discharge: 2021-03-14 | Disposition: A | Discharge status: Home / Self Care | Payer: Medicare Other | Attending: Emergency Medicine | Admitting: Emergency Medicine

## 2021-03-13 ENCOUNTER — Encounter (HOSPITAL_COMMUNITY): Payer: Self-pay | Admitting: *Deleted

## 2021-03-13 DIAGNOSIS — E1122 Type 2 diabetes mellitus with diabetic chronic kidney disease: Secondary | ICD-10-CM | POA: Diagnosis not present

## 2021-03-13 DIAGNOSIS — E039 Hypothyroidism, unspecified: Secondary | ICD-10-CM | POA: Insufficient documentation

## 2021-03-13 DIAGNOSIS — Z79899 Other long term (current) drug therapy: Secondary | ICD-10-CM | POA: Diagnosis not present

## 2021-03-13 DIAGNOSIS — N183 Chronic kidney disease, stage 3 unspecified: Secondary | ICD-10-CM | POA: Diagnosis not present

## 2021-03-13 DIAGNOSIS — R197 Diarrhea, unspecified: Secondary | ICD-10-CM | POA: Diagnosis not present

## 2021-03-13 DIAGNOSIS — K5730 Diverticulosis of large intestine without perforation or abscess without bleeding: Secondary | ICD-10-CM | POA: Diagnosis not present

## 2021-03-13 DIAGNOSIS — Z7982 Long term (current) use of aspirin: Secondary | ICD-10-CM | POA: Diagnosis not present

## 2021-03-13 DIAGNOSIS — Z7902 Long term (current) use of antithrombotics/antiplatelets: Secondary | ICD-10-CM | POA: Diagnosis not present

## 2021-03-13 DIAGNOSIS — R3915 Urgency of urination: Secondary | ICD-10-CM | POA: Diagnosis not present

## 2021-03-13 DIAGNOSIS — R350 Frequency of micturition: Secondary | ICD-10-CM | POA: Diagnosis not present

## 2021-03-13 DIAGNOSIS — Z951 Presence of aortocoronary bypass graft: Secondary | ICD-10-CM | POA: Diagnosis not present

## 2021-03-13 DIAGNOSIS — R112 Nausea with vomiting, unspecified: Secondary | ICD-10-CM | POA: Insufficient documentation

## 2021-03-13 DIAGNOSIS — I129 Hypertensive chronic kidney disease with stage 1 through stage 4 chronic kidney disease, or unspecified chronic kidney disease: Secondary | ICD-10-CM | POA: Insufficient documentation

## 2021-03-13 DIAGNOSIS — Z8673 Personal history of transient ischemic attack (TIA), and cerebral infarction without residual deficits: Secondary | ICD-10-CM | POA: Insufficient documentation

## 2021-03-13 DIAGNOSIS — Z8616 Personal history of COVID-19: Secondary | ICD-10-CM | POA: Insufficient documentation

## 2021-03-13 DIAGNOSIS — I2510 Atherosclerotic heart disease of native coronary artery without angina pectoris: Secondary | ICD-10-CM | POA: Insufficient documentation

## 2021-03-13 DIAGNOSIS — F17210 Nicotine dependence, cigarettes, uncomplicated: Secondary | ICD-10-CM | POA: Insufficient documentation

## 2021-03-13 DIAGNOSIS — Z7951 Long term (current) use of inhaled steroids: Secondary | ICD-10-CM | POA: Diagnosis not present

## 2021-03-13 DIAGNOSIS — K529 Noninfective gastroenteritis and colitis, unspecified: Secondary | ICD-10-CM | POA: Diagnosis not present

## 2021-03-13 DIAGNOSIS — R1084 Generalized abdominal pain: Secondary | ICD-10-CM | POA: Insufficient documentation

## 2021-03-13 DIAGNOSIS — J449 Chronic obstructive pulmonary disease, unspecified: Secondary | ICD-10-CM | POA: Insufficient documentation

## 2021-03-13 DIAGNOSIS — K551 Chronic vascular disorders of intestine: Secondary | ICD-10-CM

## 2021-03-13 DIAGNOSIS — I10 Essential (primary) hypertension: Secondary | ICD-10-CM | POA: Diagnosis not present

## 2021-03-13 LAB — URINALYSIS, ROUTINE W REFLEX MICROSCOPIC
Bilirubin Urine: NEGATIVE
Glucose, UA: NEGATIVE mg/dL
Hgb urine dipstick: NEGATIVE
Ketones, ur: NEGATIVE mg/dL
Leukocytes,Ua: NEGATIVE
Nitrite: NEGATIVE
Protein, ur: NEGATIVE mg/dL
Specific Gravity, Urine: 1.014 (ref 1.005–1.030)
pH: 5 (ref 5.0–8.0)

## 2021-03-13 LAB — CBC WITH DIFFERENTIAL/PLATELET
Abs Immature Granulocytes: 0.01 10*3/uL (ref 0.00–0.07)
Basophils Absolute: 0 10*3/uL (ref 0.0–0.1)
Basophils Relative: 0 %
Eosinophils Absolute: 0.1 10*3/uL (ref 0.0–0.5)
Eosinophils Relative: 2 %
HCT: 36.2 % (ref 36.0–46.0)
Hemoglobin: 11.9 g/dL — ABNORMAL LOW (ref 12.0–15.0)
Immature Granulocytes: 0 %
Lymphocytes Relative: 35 %
Lymphs Abs: 2 10*3/uL (ref 0.7–4.0)
MCH: 31.5 pg (ref 26.0–34.0)
MCHC: 32.9 g/dL (ref 30.0–36.0)
MCV: 95.8 fL (ref 80.0–100.0)
Monocytes Absolute: 0.7 10*3/uL (ref 0.1–1.0)
Monocytes Relative: 13 %
Neutro Abs: 2.9 10*3/uL (ref 1.7–7.7)
Neutrophils Relative %: 50 %
Platelets: 171 10*3/uL (ref 150–400)
RBC: 3.78 MIL/uL — ABNORMAL LOW (ref 3.87–5.11)
RDW: 12.9 % (ref 11.5–15.5)
WBC: 5.8 10*3/uL (ref 4.0–10.5)
nRBC: 0 % (ref 0.0–0.2)

## 2021-03-13 MED ORDER — MORPHINE SULFATE (PF) 4 MG/ML IV SOLN
4.0000 mg | Freq: Once | INTRAVENOUS | Status: AC
Start: 2021-03-13 — End: 2021-03-13
  Administered 2021-03-13: 4 mg via INTRAVENOUS
  Filled 2021-03-13: qty 1

## 2021-03-13 MED ORDER — ONDANSETRON HCL 4 MG/2ML IJ SOLN
4.0000 mg | Freq: Once | INTRAMUSCULAR | Status: AC
  Administered 2021-03-13: 4 mg via INTRAVENOUS
  Filled 2021-03-13: qty 2

## 2021-03-13 MED ORDER — IOHEXOL 350 MG/ML SOLN
100.0000 mL | Freq: Once | INTRAVENOUS | Status: AC | PRN
  Administered 2021-03-13: 100 mL via INTRAVENOUS

## 2021-03-13 NOTE — ED Triage Notes (Signed)
Pt c/o abdominal pain x one month; pt has had diarrhea x one week; pt has a hx of perforated colon and has had some of her colon removed x 2 years ago

## 2021-03-13 NOTE — ED Provider Notes (Addendum)
[facility] EMERGENCY DEPARTMENT Provider Note   CSN: [id number] Arrival date & time: 03/13/21  1925     History Chief Complaint  Patient presents with   Abdominal Pain    [name] is a 75 y.o. female with a history of acute ischemic colitis, mesenteric ischemia, diverticulosis, CHF, CAD, type 2 diabetes, hypertension.  Presents emergency department with a chief complaint of abdominal pain.  Patient reports abdominal pain has been present over the last 6 months.  Pain has been getting progressively worse over this time.  Patient reports that pain has been acutely increased over the last week and especially over the last 2 days.  Pain has been constant however waxes and wanes in intensity.  Patient will sometimes have sharp increase in pain.  Pain is generalized throughout her entire abdomen.  At present patient rates pain 8/10 on the pain scale.  Patient reports the pain is worse with eating food.  Patient reports taking old pain medication earlier today with no relief of symptoms.  Patient reports that she has had nausea and vomiting since 1030 this afternoon.  Patient describes vomit as "mucus."  Denies any hematemesis or coffee-ground emesis.  Patient states that she had diarrhea last week however this resolved after taking Lomotil and Levbid.  Additionally patient reports urinary frequency and urgency.  Patient denies any fever, chills, abdominal distention, blood in stool, melena, constipation, dysuria, hematuria, vaginal pain, vaginal bleeding, vaginal discharge, lightheadedness, dizziness, syncope.   Abdominal Pain Associated symptoms: diarrhea, nausea and vomiting   Associated symptoms: no chest pain, no chills, no constipation, no dysuria, no fever, no hematuria, no shortness of breath, no vaginal bleeding and no vaginal discharge       Past Medical History:  Diagnosis Date   Acute ischemic colitis ([place]) 09/11/2005   Anxiety    Arthritis    Atherosclerotic vascular  disease    Calcified plaque at the origin of the celiac and SMA   CHF (congestive heart failure) (HCC)    Coronary atherosclerosis of native coronary artery    Mulitvessel LVEF 50-50%, DES circ 1/11, occluded SVG to OM and SVG to diagonal , LVEF 60%   COVID-19 virus infection 11/07/2020   Depression    Diverticulosis of colon    Essential hypertension    Hypothyroidism    Mesenteric ischemia (HCC)    Mitral regurgitation    Mild to moderate   Mixed hyperlipidemia    Myocardial infarction ([place]) 1990   Obesity    Orthostatic hypotension    PAD (peripheral artery disease) (HCC)    PVC's (premature ventricular contractions)    Schizophrenia ([place])    SINUS BRADYCARDIA 10/13/2009   Qualifier: Diagnosis of  By: Patterson Hammersmith    Sleep apnea    CPAP   TIA (transient ischemic attack) 11/20/2017   Tubular adenoma    Type 2 diabetes mellitus ([place])    Vascular complications of mesenteric artery     Patient Active Problem List   Diagnosis Date Noted   Morbid obesity ([place]) 03/12/2021   Abdominal pain 03/10/2021   Stage 3 chronic kidney disease ([place]) 12/11/2020   Overactive bladder 08/03/2020   Arthritis of knee 05/13/2020   Right leg pain 05/09/2020   Dermatitis 03/11/2020   Anxiety 02/11/2020   Ischemic colitis ([place]) 09/30/2019   DM2 (diabetes mellitus, type 2) ([place]) 09/29/2019   GERD (gastroesophageal reflux disease) 09/29/2019   Hypothyroidism 09/29/2019   Carotid stenosis, right 11/20/2017   COPD (chronic obstructive  pulmonary disease) ([place]) 09/27/2017   OSA (obstructive sleep apnea) 09/24/2017   MDD (major depressive disorder), recurrent episode, moderate ([place]) 11/05/2016   PAD (peripheral artery disease) ([place]) 12/06/2014   Chronic mesenteric ischemia ([place]) 08/12/2012   Chronic diarrhea 03/18/2012   Dyspnea on exertion 05/06/2009   MITRAL REGURGITATION 03/18/2009   Tobacco abuse 02/11/2009   HLD (hyperlipidemia) 01/19/2009   Benign essential hypertension  01/19/2009   CAD, NATIVE VESSEL 01/19/2009    Past Surgical History:  Procedure Laterality Date   BACK SURGERY     Lumbar spine surgery   Carotid endarectomy Bilateral    CARPAL TUNNEL RELEASE     Bilateral   CATARACT EXTRACTION W/PHACO Right 08/24/2013   Procedure: CATARACT EXTRACTION PHACO AND INTRAOCULAR LENS PLACEMENT ([place]);  Surgeon: Tonny Branch, MD;  Location: [facility];  Service: Ophthalmology;  Laterality: Right;  CDE 8.68   COLONOSCOPY  09/14/2005   Dr. Leonard Schwartz colitis   COLONOSCOPY WITH ESOPHAGOGASTRODUODENOSCOPY (EGD)  04/14/2012   Dr. Gala Romney- EGD= gastric erosions of doubtful clinical significance per bx- chronic inactive gastritis, benign small bowel mucosa. TCS=colonic diverticulosis, tubular adenoma   CORONARY ARTERY BYPASS GRAFT     LIMA-LAD; SVG-OM; SVG-DX in [place]     Cervical laminectomy   PARTIAL HYSTERECTOMY     stents x4       OB History     Gravida  4   Para  4   Term  4   Preterm      AB      Living  4      SAB      IAB      Ectopic      Multiple      Live Births              Family History  Problem Relation Age of Onset   Alcohol abuse Mother    Alcohol abuse Father    Coronary artery disease Other    Hypertension Other    Crohn's disease Sister 43   Stroke Daughter     Social History   Tobacco Use   Smoking status: Light Smoker    Packs/day: 0.50    Years: 50.00    Pack years: 25.00    Types: Cigarettes    Start date: 12/30/2018   Smokeless tobacco: Never  Vaping Use   Vaping Use: Never used  Substance Use Topics   Alcohol use: Yes    Alcohol/week: 0.0 standard drinks    Comment: occasionally    Drug use: No    Home Medications Prior to Admission medications   Medication Sig Start Date End Date Taking? Authorizing Provider  albuterol (VENTOLIN HFA) 108 (90 Base) MCG/ACT inhaler USE 2 INHALATIONS BY MOUTH  EVERY 6 HOURS AS NEEDED FOR WHEEZING OR SHORTNESS OF  BREATH 10/07/20   Lindell Spar, MD  ALPRAZolam Duanne Moron) 0.5 MG tablet Take 1 tablet (0.5 mg total) by mouth 2 (two) times daily as needed for anxiety. for anxiety 03/08/21   Lindell Spar, MD  aspirin EC 81 MG tablet Take 81 mg by mouth daily.    [provider]  clopidogrel (PLAVIX) 75 MG tablet Take 1 tablet (75 mg total) by mouth daily. 10/10/20   Satira Sark, MD  diphenoxylate-atropine (LOMOTIL) 2.5-0.025 MG tablet Take one tablet by mouth twice daily ,a s needed, for loose stool 03/10/21   Fayrene Helper, MD  fluticasone (FLOVENT HFA) 110 MCG/ACT inhaler Inhale 1  puff into the lungs in the morning and at bedtime. 08/04/20   Lindell Spar, MD  gabapentin (NEURONTIN) 300 MG capsule TAKE 1 CAPSULE BY MOUTH  TWICE DAILY Patient not taking: Reported on 03/10/2021 01/16/21   Lindell Spar, MD  hyoscyamine (LEVBID) 0.375 MG 12 hr tablet Take one tablet by mouth two times daily, as needed, for stomach cramping 03/10/21   Fayrene Helper, MD  isosorbide mononitrate (IMDUR) 60 MG 24 hr tablet Take 1 tablet (60 mg total) by mouth daily. 10/10/20   Satira Sark, MD  levothyroxine (SYNTHROID) 50 MCG tablet TAKE 1 TABLET BY MOUTH  DAILY BEFORE BREAKFAST 10/07/20   Lindell Spar, MD  metoprolol succinate (TOPROL XL) 25 MG 24 hr tablet Take 1 tablet (25 mg total) by mouth daily. 10/10/20   Satira Sark, MD  nitroGLYCERIN (NITROSTAT) 0.4 MG SL tablet Place 1 tablet (0.4 mg total) under the tongue every 5 (five) minutes as needed. 02/16/11   Satira Sark, MD  pravastatin (PRAVACHOL) 80 MG tablet TAKE 1 TABLET BY MOUTH  DAILY 09/27/20   Lindell Spar, MD  tiZANidine (ZANAFLEX) 4 MG tablet Take 1 tablet (4 mg total) by mouth every 6 (six) hours as needed for muscle spasms. Patient not taking: Reported on 03/10/2021 10/05/20   Noreene Larsson, NP  torsemide (DEMADEX) 20 MG tablet Take 2 tablets (40 mg total) by mouth daily. 10/10/20   Satira Sark, MD  zolpidem (AMBIEN) 10 MG tablet Take 1  tablet (10 mg total) by mouth at bedtime as needed for sleep. 02/24/21   Lindell Spar, MD    Allergies    Patient has no known allergies.  Review of Systems   Review of Systems  Constitutional:  Negative for chills and fever.  Eyes:  Negative for visual disturbance.  Respiratory:  Negative for shortness of breath.   Cardiovascular:  Negative for chest pain.  Gastrointestinal:  Positive for abdominal pain, diarrhea, nausea and vomiting. Negative for abdominal distention, anal bleeding, blood in stool, constipation and rectal pain.  Genitourinary:  Positive for frequency and urgency. Negative for difficulty urinating, dysuria, flank pain, hematuria, vaginal bleeding, vaginal discharge and vaginal pain.  Musculoskeletal:  Negative for back pain and neck pain.  Skin:  Negative for color change and rash.  Neurological:  Negative for dizziness, syncope, light-headedness and headaches.  Psychiatric/Behavioral:  Negative for confusion.    Physical Exam Updated Vital Signs BP (!) 120/99 (BP Location: Right Arm)   Pulse 60   Temp (!) 97.5 F (36.4 C) (Oral)   Resp (!) 23   Ht 5\' 4"  (1.626 m)   Wt 104.3 kg   SpO2 94%   BMI 39.48 kg/m   Physical Exam Vitals and nursing note reviewed.  Constitutional:      General: She is not in acute distress.    Appearance: She is obese. She is ill-appearing. She is not toxic-appearing or diaphoretic.     Comments: Appears uncomfortable due to complaints of pain   HENT:     Head: Normocephalic.  Eyes:     General: No scleral icterus.       Right eye: No discharge.        Left eye: No discharge.  Cardiovascular:     Rate and Rhythm: Normal rate.  Pulmonary:     Effort: Pulmonary effort is normal. No tachypnea, bradypnea or respiratory distress.     Breath sounds: Normal breath sounds. No stridor.  Abdominal:  General: Bowel sounds are normal. There is no distension. There are no signs of injury.     Palpations: Abdomen is soft. There is  no mass or pulsatile mass.     Tenderness: There is generalized abdominal tenderness. There is no right CVA tenderness, left CVA tenderness, guarding or rebound.     Hernia: There is no hernia in the umbilical area or ventral area.     Comments: Patient has small areas of tenderness generalized throughout her abdomen however abdomen is largely nontender.  Tenderness is not localized.  Skin:    General: Skin is warm and dry.  Neurological:     General: No focal deficit present.     Mental Status: She is alert.  Psychiatric:        Behavior: Behavior is cooperative.    ED Results / Procedures / Treatments   Labs (all labs ordered are listed, but only abnormal results are displayed) Labs Reviewed  URINALYSIS, ROUTINE W REFLEX MICROSCOPIC - Abnormal; Notable for the following components:      Result Value   APPearance HAZY (*)    All other components within normal limits  URINE CULTURE  LIPASE, BLOOD  COMPREHENSIVE METABOLIC PANEL  CBC WITH DIFFERENTIAL/PLATELET    EKG None  Radiology No results found.  Procedures Procedures   Medications Ordered in ED Medications - No data to display  ED Course  I have reviewed the triage vital signs and the nursing notes.  Pertinent labs & imaging results that were available during my care of the patient were reviewed by me and considered in my medical decision making (see chart for details).    MDM Rules/Calculators/A&P                            Alert 75 year old female no acute distress, nontoxic appearing.  Patient appears ill and is uncomfortable due to complaints of abdominal pain.  Patient has history of mesenteric ischemia and ischemic colitis.  Followed by Dr. Alroy Dust with vascular surgery due to mesenteric ischemia.  She underwent [place] mesenteric bypass in February 2021.  Presents today with chief complaint of abdominal pain.  Pain has been present over the last 6 months and has been gradually worsening over this  time.  Pain has been acutely worse over the last week.  Pain is worse with eating.  Patient endorses nausea, vomiting, and diarrhea.  Patient has small areas of tenderness generalized throughout her abdomen however abdomen is largely nontender.  Tenderness is not localized.  Will obtain lipase, CMP, CBC, urinalysis, and urine culture.  We will give patient Zofran and morphine.  CTA ordered to evaluate for mesenteric ischemia with patient's history.  Patient care transferred to Dr. Dina Rich at the end of my shift. Patient presentation, ED course, and plan of care discussed with review of all pertinent labs and imaging. Please see his/her note for further details regarding further ED course and disposition.   Final Clinical Impression(s) / ED Diagnoses Final diagnoses:  None    Rx / DC Orders ED Discharge Orders     None        Loni Beckwith, PA-C 03/13/21 2303    Loni Beckwith, PA-C 03/13/21 2305    Merryl Hacker, MD 03/14/21 [phone]

## 2021-03-13 NOTE — Progress Notes (Deleted)
Cardiology Office Note  Date: 03/13/2021   ID: [name], DOB 1945-09-09, MRN [id number]  PCP:  Lindell Spar, MD  Cardiologist:  Rozann Lesches, MD Electrophysiologist:  None   Chief Complaint: 6-week follow-up  History of Present Illness: Misty FREDERIC is a 75 y.o. female with a history of HTN, CAD, chronic mesenteric ischemia, carotid stenosis, PAD, OSA, COPD, GERD, DM2, hypothyroidism, CKD stage III, HLD, tobacco abuse.  Morbid obesity, mitral regurgitation.  She was last seen by Dr. Domenic Polite on 01/03/2021.  She has been feeling fatigued with activity and shortness of breath with rapid heart beat and occasional sense of skipping.  This has been going on for several weeks.  She has been experiencing chest discomfort for 4 days the prior week.  History of mesenteric ischemia with right hemicolectomy and right iliac to SMA bypass at [facility] on 06/13/2019.  Postoperative atrial fibrillation and was temporarily on amiodarone.  She had experienced no recurrence since that time.  Cardiac monitor from March showed atrial and ventricular ectopy but no atrial fibrillation.  EKG during that visit showed sinus bradycardia with prolonged PR interval heart rate in the 50s.  Nonspecific T wave changes.  Plan was to follow-up with a 7-day ZIO monitor and continue beta-blocker.  If she had evidence of recurrent atrial fibrillation would need to consider initiation of NOAC and simplification of her antiplatelet regimen.  Due to continuation of fatigue and shortness of breath with multivessel CAD status post CABG plan was to obtain [place] to reevaluate ischemic burden.  Otherwise continue aspirin, Plavix, Imdur, Toprol, Pravachol.  Lexiscan and cardiac monitor review  Past Medical History:  Diagnosis Date   Acute ischemic colitis ([place]) 09/11/2005   Anxiety    Arthritis    Atherosclerotic vascular disease    Calcified plaque at the origin of the celiac and SMA   CHF (congestive  heart failure) (HCC)    Coronary atherosclerosis of native coronary artery    Mulitvessel LVEF 50-50%, DES circ 1/11, occluded SVG to OM and SVG to diagonal , LVEF 60%   COVID-19 virus infection 11/07/2020   Depression    Diverticulosis of colon    Essential hypertension    Hypothyroidism    Mesenteric ischemia (HCC)    Mitral regurgitation    Mild to moderate   Mixed hyperlipidemia    Myocardial infarction ([place]) 1990   Obesity    Orthostatic hypotension    PAD (peripheral artery disease) (HCC)    PVC's (premature ventricular contractions)    Schizophrenia ([place])    SINUS BRADYCARDIA 10/13/2009   Qualifier: Diagnosis of  By: Patterson Hammersmith    Sleep apnea    CPAP   TIA (transient ischemic attack) 11/20/2017   Tubular adenoma    Type 2 diabetes mellitus (HCC)    Vascular complications of mesenteric artery     Past Surgical History:  Procedure Laterality Date   BACK SURGERY     Lumbar spine surgery   Carotid endarectomy Bilateral    CARPAL TUNNEL RELEASE     Bilateral   CATARACT EXTRACTION W/PHACO Right 08/24/2013   Procedure: CATARACT EXTRACTION PHACO AND INTRAOCULAR LENS PLACEMENT ([place]);  Surgeon: Tonny Branch, MD;  Location: [facility];  Service: Ophthalmology;  Laterality: Right;  CDE 8.68   COLONOSCOPY  09/14/2005   Dr. Leonard Schwartz colitis   COLONOSCOPY WITH ESOPHAGOGASTRODUODENOSCOPY (EGD)  04/14/2012   Dr. Gala Romney- EGD= gastric erosions of doubtful clinical significance per bx- chronic inactive gastritis, benign  small bowel mucosa. TCS=colonic diverticulosis, tubular adenoma   CORONARY ARTERY BYPASS GRAFT     LIMA-LAD; SVG-OM; SVG-DX in [place]     Cervical laminectomy   PARTIAL HYSTERECTOMY     stents x4      Current Outpatient Medications  Medication Sig Dispense Refill   albuterol (VENTOLIN HFA) 108 (90 Base) MCG/ACT inhaler USE 2 INHALATIONS BY MOUTH  EVERY 6 HOURS AS NEEDED FOR WHEEZING OR SHORTNESS OF  BREATH 18 g 0   ALPRAZolam  (XANAX) 0.5 MG tablet Take 1 tablet (0.5 mg total) by mouth 2 (two) times daily as needed for anxiety. for anxiety 60 tablet 2   aspirin EC 81 MG tablet Take 81 mg by mouth daily.     clopidogrel (PLAVIX) 75 MG tablet Take 1 tablet (75 mg total) by mouth daily. 90 tablet 2   diphenoxylate-atropine (LOMOTIL) 2.5-0.025 MG tablet Take one tablet by mouth twice daily ,a s needed, for loose stool 15 tablet 0   fluticasone (FLOVENT HFA) 110 MCG/ACT inhaler Inhale 1 puff into the lungs in the morning and at bedtime. 1 each 12   gabapentin (NEURONTIN) 300 MG capsule TAKE 1 CAPSULE BY MOUTH  TWICE DAILY (Patient not taking: Reported on 03/10/2021) 180 capsule 3   hyoscyamine (LEVBID) 0.375 MG 12 hr tablet Take one tablet by mouth two times daily, as needed, for stomach cramping 30 tablet 0   isosorbide mononitrate (IMDUR) 60 MG 24 hr tablet Take 1 tablet (60 mg total) by mouth daily. 90 tablet 2   levothyroxine (SYNTHROID) 50 MCG tablet TAKE 1 TABLET BY MOUTH  DAILY BEFORE BREAKFAST 30 tablet 11   metoprolol succinate (TOPROL XL) 25 MG 24 hr tablet Take 1 tablet (25 mg total) by mouth daily. 90 tablet 2   nitroGLYCERIN (NITROSTAT) 0.4 MG SL tablet Place 1 tablet (0.4 mg total) under the tongue every 5 (five) minutes as needed. 90 tablet 3   pravastatin (PRAVACHOL) 80 MG tablet TAKE 1 TABLET BY MOUTH  DAILY 90 tablet 3   tiZANidine (ZANAFLEX) 4 MG tablet Take 1 tablet (4 mg total) by mouth every 6 (six) hours as needed for muscle spasms. (Patient not taking: Reported on 03/10/2021) 30 tablet 0   torsemide (DEMADEX) 20 MG tablet Take 2 tablets (40 mg total) by mouth daily. 180 tablet 2   zolpidem (AMBIEN) 10 MG tablet Take 1 tablet (10 mg total) by mouth at bedtime as needed for sleep. 30 tablet 2   No current facility-administered medications for this visit.   Allergies:  Patient has no known allergies.   Social History: The patient  reports that she has been smoking cigarettes. She started smoking about 2  years ago. She has a 25.00 pack-year smoking history. She has never used smokeless tobacco. She reports current alcohol use. She reports that she does not use drugs.   Family History: The patient's family history includes Alcohol abuse in her father and mother; Coronary artery disease in an other family member; Crohn's disease (age of onset: 102) in her sister; Hypertension in an other family member; Stroke in her daughter.   ROS:  Please see the history of present illness. Otherwise, complete review of systems is positive for {NONE DEFAULTED:18576}.  All other systems are reviewed and negative.   Physical Exam: VS:  There were no vitals taken for this visit., BMI There is no height or weight on file to calculate BMI.  Wt Readings from Last 3 Encounters:  03/10/21 230 lb 1.3 oz (104.4 kg)  01/03/21 238 lb (108 kg)  12/08/20 239 lb (108.4 kg)    General: Patient appears comfortable at rest. HEENT: Conjunctiva and lids normal, oropharynx clear with moist mucosa. Neck: Supple, no elevated JVP or carotid bruits, no thyromegaly. Lungs: Clear to auscultation, nonlabored breathing at rest. Cardiac: Regular rate and rhythm, no S3 or significant systolic murmur, no pericardial rub. Abdomen: Soft, nontender, no hepatomegaly, bowel sounds present, no guarding or rebound. Extremities: No pitting edema, distal pulses 2+. Skin: Warm and dry. Musculoskeletal: No kyphosis. Neuropsychiatric: Alert and oriented x3, affect grossly appropriate.  ECG:  {EKG/Telemetry Strips Reviewed:[id number]}  Recent Labwork: 08/03/2020: TSH 1.140 03/10/2021: ALT 10; AST 14; BUN 17; Creatinine, Ser 1.14; Hemoglobin 12.9; Platelets 183; Potassium 4.6; Sodium 142     Component Value Date/Time   CHOL 142 08/19/2017 1505   TRIG 154 (H) 08/19/2017 1505   HDL 45 08/19/2017 1505   CHOLHDL 3.2 08/19/2017 1505   LDLCALC 66 08/19/2017 1505    Other Studies Reviewed Today:   Long-term monitor 01/24/2021 ZIO XT reviewed.  6  days, 8 hours analyzed.  Predominant rhythm is sinus with prolonged PR interval and heart rate ranging from 39 bpm (early morning) up to 116 bpm and average heart rate 59 bpm.  Rare PACs including couplets and triplets were noted representing less than 1% total beats.  Rare PVCs including couplets and triplets were noted representing less than 1% total beats.  There were also limited episodes of ventricular bigeminy and trigeminy.  Multiple episodes of SVT were noted, these were generally brief with the longest lasting only 13 beats, nonsustained.  There were no ventricular arrhythmias.  No significant pauses.   01/11/2021 Lexiscan Myoview   The study is low risk.   No ST deviation was noted. The ECG was negative for ischemia.   LV perfusion is abnormal. Defect 1: There is a small defect with mild reduction in uptake present in the apical anterior location(s) that is fixed. There is normal wall motion in the defect area. Consistent with artifact caused by breast attenuation. Defect 2: There is a small defect with mild reduction in uptake present in the mid to basal inferior location(s) that is partially reversible. There is normal wall motion in the defect area. Consistent with ischemia.   Left ventricular function is normal. Nuclear stress EF: 61 %.   Breast attenuation artifact was present.   Low risk study with evidence of breast attenuation artifact and also mild mid to basal inferior ischemia, LVEF normal at 61%.    Carotid artery duplex 12/29/2020 Summary:  Right Carotid: Velocities in the right ICA are consistent with a 1-39%  stenosis.The ECA appears >50% stenosed.   Left Carotid: Velocities in the left ICA are consistent with a 1-39%  stenosis.   Vertebrals:  Left vertebral artery demonstrates antegrade flow. Right  atypical  antegrade.   Subclavians: Right subclavian artery flow was disturbed. Normal flow               hemodynamics were seen in the left subclavian artery.       Echocardiogram 06/14/2020:  1. Abnormal septal motion distal septal apical mid and basal inferior  wall hypokinesis . Left ventricular ejection fraction, by estimation, is  45 to 50%. The left ventricle has mildly decreased function. The left  ventricle has no regional wall motion  abnormalities. The left ventricular internal cavity size was mildly  dilated. There is moderate left ventricular hypertrophy. Left ventricular  diastolic parameters are consistent with Grade I diastolic dysfunction  (impaired relaxation).   2. Right ventricular systolic function is normal. The right ventricular  size is normal.   3. Left atrial size was mildly dilated.   4. The mitral valve is degenerative. Mild mitral valve regurgitation. No  evidence of mitral stenosis.   5. The aortic valve is tricuspid. Aortic valve regurgitation is not  visualized. Mild aortic valve sclerosis is present, with no evidence of  aortic valve stenosis.   6. The inferior vena cava is normal in size with greater than 50%  respiratory variability, suggesting right atrial pressure of 3 mmHg.    Cardiac monitor March 2022: ZIO AT reviewed.  11 days 20 hours analyzed.  Predominant rhythm is sinus with heart rate ranging from 49 bpm up to 100 bpm and average heart rate 61 bpm.  PR interval prolonged at baseline.  There were rare PACs including couplets and triplets representing less than 1% total beats.  There were also rare PVCs including couplets and triplets representing less than 1% total beats.  Ventricular trigeminy noted.  Single run of NSVT lasting 11 beats noted.  There were otherwise several brief episodes of PSVT, the longest of which was only 8 beats.  No sustained arrhythmias or pauses.   Carotid Dopplers 12/29/2020: Summary:  Right Carotid: Velocities in the right ICA are consistent with a 1-39%  stenosis.                 The ECA appears >50% stenosed.   Left Carotid: Velocities in the left ICA are consistent with a  1-39%  stenosis.   Vertebrals:  Left vertebral artery demonstrates antegrade flow. Right  atypical               antegrade.  Subclavians: Right subclavian artery flow was disturbed. Normal flow               hemodynamics were seen in the left subclavian artery.     Assessment and Plan:  No diagnosis found.   Medication Adjustments/Labs and Tests Ordered: Current medicines are reviewed at length with the patient today.  Concerns regarding medicines are outlined above.   Disposition: Follow-up with ***  Signed, Levell July, NP 03/13/2021 8:11 PM    [facility], [place], [place] [address] Phone: [phone]; Fax: [phone]

## 2021-03-14 ENCOUNTER — Ambulatory Visit: Payer: Medicare Other | Admitting: Family Medicine

## 2021-03-14 DIAGNOSIS — R002 Palpitations: Secondary | ICD-10-CM

## 2021-03-14 DIAGNOSIS — R0602 Shortness of breath: Secondary | ICD-10-CM

## 2021-03-14 DIAGNOSIS — R5383 Other fatigue: Secondary | ICD-10-CM

## 2021-03-14 DIAGNOSIS — K5730 Diverticulosis of large intestine without perforation or abscess without bleeding: Secondary | ICD-10-CM | POA: Diagnosis not present

## 2021-03-14 LAB — COMPREHENSIVE METABOLIC PANEL
ALT: 10 U/L (ref 0–44)
AST: 16 U/L (ref 15–41)
Albumin: 3.6 g/dL (ref 3.5–5.0)
Alkaline Phosphatase: 103 U/L (ref 38–126)
Anion gap: 9 (ref 5–15)
BUN: 15 mg/dL (ref 8–23)
CO2: 23 mmol/L (ref 22–32)
Calcium: 8.4 mg/dL — ABNORMAL LOW (ref 8.9–10.3)
Chloride: 107 mmol/L (ref 98–111)
Creatinine, Ser: 1.03 mg/dL — ABNORMAL HIGH (ref 0.44–1.00)
GFR, Estimated: 56 mL/min — ABNORMAL LOW (ref >60)
Glucose, Bld: 102 mg/dL — ABNORMAL HIGH (ref 70–99)
Potassium: 3.7 mmol/L (ref 3.5–5.1)
Sodium: 139 mmol/L (ref 135–145)
Total Bilirubin: 0.7 mg/dL (ref 0.3–1.2)
Total Protein: 7 g/dL (ref 6.5–8.1)

## 2021-03-14 LAB — LIPASE, BLOOD: Lipase: 28 U/L (ref 11–51)

## 2021-03-14 MED ORDER — ONDANSETRON 4 MG PO TBDP: 4.0000 mg | ORAL_TABLET | Freq: Three times a day (TID) | ORAL | 0 refills | Status: DC | PRN

## 2021-03-14 MED ORDER — OXYCODONE-ACETAMINOPHEN 5-325 MG PO TABS
1.0000 | ORAL_TABLET | Freq: Once | ORAL | Status: AC
  Administered 2021-03-14: 1 via ORAL
  Filled 2021-03-14: qty 1

## 2021-03-14 MED ORDER — OXYCODONE-ACETAMINOPHEN 5-325 MG PO TABS: 1.0000 | ORAL_TABLET | Freq: Four times a day (QID) | ORAL | 0 refills | Status: DC | PRN

## 2021-03-14 NOTE — ED Notes (Signed)
Pt A&OX4 at d/c, verbalized understanding of d/c instructions, prescriptions and follow up care. Pts daughter at bedside who states she will be driving the patient home.

## 2021-03-14 NOTE — ED Notes (Signed)
Pt reported her abd pain came back and the pain shot from her abdomen up into her chest. New EKG obtained and Dr. Dina Rich informed.

## 2021-03-14 NOTE — Discharge Instructions (Signed)
You were seen today for ongoing abdominal pain.  This may be related to ongoing chronic mesenteric insufficiency.  Your CT scan is mostly unchanged from your CT on 09/2019.  Follow-up with your primary physician and surgeon regarding ongoing treatment for this.  Take medications at home as needed.

## 2021-03-15 LAB — URINE CULTURE: Culture: NO GROWTH

## 2021-03-18 LAB — STOOL CULTURE: E coli, Shiga toxin Assay: NEGATIVE

## 2021-03-23 ENCOUNTER — Other Ambulatory Visit: Payer: Self-pay | Admitting: Internal Medicine

## 2021-03-24 ENCOUNTER — Telehealth: Payer: Self-pay

## 2021-03-24 NOTE — Telephone Encounter (Signed)
[name] pts grand daughter called said pt is wearing a blood pressure monitor and someone called and said that her blood pressure was too high and she needed to reach out to her PCP immediately for meds.  I ask what her BP was and was told 175/80.  Can you please advise as to what I should do thanks

## 2021-03-24 NOTE — Telephone Encounter (Signed)
Spoke with kimberly pts grand daughter she states understanding of taking her to urgent care

## 2021-03-29 DIAGNOSIS — E1100 Type 2 diabetes mellitus with hyperosmolarity without nonketotic hyperglycemic-hyperosmolar coma (NKHHC): Secondary | ICD-10-CM | POA: Diagnosis not present

## 2021-03-29 DIAGNOSIS — J449 Chronic obstructive pulmonary disease, unspecified: Secondary | ICD-10-CM | POA: Diagnosis not present

## 2021-03-29 DIAGNOSIS — I10 Essential (primary) hypertension: Secondary | ICD-10-CM | POA: Diagnosis not present

## 2021-04-03 ENCOUNTER — Ambulatory Visit: Payer: Medicare Other | Admitting: Internal Medicine

## 2021-04-06 ENCOUNTER — Telehealth: Payer: Self-pay | Admitting: Cardiology

## 2021-04-06 ENCOUNTER — Other Ambulatory Visit: Payer: Self-pay

## 2021-04-06 ENCOUNTER — Other Ambulatory Visit: Payer: Self-pay | Admitting: Cardiology

## 2021-04-06 ENCOUNTER — Ambulatory Visit: Payer: Medicare Other | Admitting: Cardiology

## 2021-04-06 ENCOUNTER — Encounter: Payer: Self-pay | Admitting: Cardiology

## 2021-04-06 VITALS — BP 158/86 | HR 50 | Ht 64.0 in | Wt 231.2 lb

## 2021-04-06 DIAGNOSIS — I25119 Atherosclerotic heart disease of native coronary artery with unspecified angina pectoris: Secondary | ICD-10-CM

## 2021-04-06 DIAGNOSIS — I200 Unstable angina: Secondary | ICD-10-CM | POA: Diagnosis not present

## 2021-04-06 MED ORDER — SODIUM CHLORIDE 0.9% FLUSH: 3.0000 mL | Freq: Two times a day (BID) | INTRAVENOUS | Status: DC

## 2021-04-06 NOTE — Telephone Encounter (Signed)
PERCERT    Right and left heart cath dx: accelerating angina & SOB Wednesday, 04/12/21 @12 :30 am with Dr. Fletcher Anon

## 2021-04-06 NOTE — Patient Instructions (Signed)
Medication Instructions:  Your physician recommends that you continue on your current medications as directed. Please refer to the Current Medication list given to you today.  Labwork: none  Testing/Procedures: Your physician has requested that you have a cardiac catheterization. Cardiac catheterization is used to diagnose and/or treat various heart conditions. Doctors may recommend this procedure for a number of different reasons. The most common reason is to evaluate chest pain. Chest pain can be a symptom of coronary artery disease (CAD), and cardiac catheterization can show whether plaque is narrowing or blocking your heart's arteries. This procedure is also used to evaluate the valves, as well as measure the blood flow and oxygen levels in different parts of your heart. For further information please visit [email or web address]. Please follow instruction sheet, as given.  Follow-Up: Your physician recommends that you schedule a follow-up appointment in: 1 month  Any Other Special Instructions Will Be Listed Below (If Applicable).  If you need a refill on your cardiac medications before your next appointment, please call your pharmacy.   [place] [address] Dept: [phone] Loc: [phone]  SHAIDA ROUTE  04/06/2021  You are scheduled for a Cardiac Catheterization on Wednesday, December 14 with Dr. Kathlyn Sacramento.  1. Please arrive at the [facility] (Main Entrance A) at [facility]: [address] [place], [place] [address] at 10:30 AM (This time is two hours before your procedure to ensure your preparation). Free valet parking service is available.   Special note: Every effort is made to have your procedure done on time. Please understand that emergencies sometimes delay scheduled procedures.  2. Diet: Do not eat solid foods after midnight.   The patient may have clear liquids until 5am upon the day of the procedure.  3. Labs: done already on 03/13/21  4. Medication instructions in preparation for your procedure: Hold torsemide and potassium on the morning of your cath.   Contrast Allergy: No  On the morning of your procedure, take your Aspirin 81 mg and any morning medicines NOT listed above.  You may use sips of water.  5. Plan for one night stay--bring personal belongings. 6. Bring a current list of your medications and current insurance cards. 7. You MUST have a responsible person to drive you home. 8. Someone MUST be with you the first 24 hours after you arrive home or your discharge will be delayed. 9. Please wear clothes that are easy to get on and off and wear slip-on shoes.  Thank you for allowing [place] to care for you!   -- [place] [facility]

## 2021-04-06 NOTE — H&P (View-Only) (Signed)
Cardiology Office Note  Date: 04/06/2021   ID: Bindu, Docter 1946-02-23, MRN [id number]  PCP:  Lindell Spar, MD  Cardiologist:  Rozann Lesches, MD Electrophysiologist:  None   Chief Complaint  Patient presents with   Cardiac follow-up    History of Present Illness: [name] is a 75 y.o. female last seen in September.  She is here today with one of her daughters for a follow-up visit.  She states that she has remained significantly fatigued, also short of breath with low-level activity, even taking a shower.  She has intermittent chest tightness suggestive of angina, uses nitroglycerin occasionally.  No progressive sense of palpitations and no syncope.  She did wear a heart monitor back in September to exclude recurrent atrial fibrillation.  She had no evidence of atrial fibrillation but brief episodes of SVT.  Resting heart rate is well controlled on current dose of beta-blocker, this has not been uptitrated.  She also underwent a follow-up Myoview in September that was low risk with evidence of both breast attenuation and also mild mid to basal inferior ischemia.  I reviewed her medications which are noted below.  Past Medical History:  Diagnosis Date   Acute ischemic colitis ([place]) 09/11/2005   Anxiety    Arthritis    Atherosclerotic vascular disease    Calcified plaque at the origin of the celiac and SMA   CHF (congestive heart failure) (HCC)    Coronary atherosclerosis of native coronary artery    Mulitvessel LVEF 50-50%, DES circ 1/11, occluded SVG to OM and SVG to diagonal , LVEF 60%   COVID-19 virus infection 11/07/2020   Depression    Diverticulosis of colon    Essential hypertension    Hypothyroidism    Mesenteric ischemia (HCC)    Mitral regurgitation    Mild to moderate   Mixed hyperlipidemia    Myocardial infarction ([place]) 1990   Obesity    Orthostatic hypotension    PAD (peripheral artery disease) (HCC)    PVC's (premature  ventricular contractions)    Schizophrenia (HCC)    Sleep apnea    CPAP   TIA (transient ischemic attack) 11/20/2017   Tubular adenoma    Type 2 diabetes mellitus (HCC)    Vascular complications of mesenteric artery     Past Surgical History:  Procedure Laterality Date   BACK SURGERY     Lumbar spine surgery   Carotid endarectomy Bilateral    CARPAL TUNNEL RELEASE     Bilateral   CATARACT EXTRACTION W/PHACO Right 08/24/2013   Procedure: CATARACT EXTRACTION PHACO AND INTRAOCULAR LENS PLACEMENT ([place]);  Surgeon: Tonny Branch, MD;  Location: [facility];  Service: Ophthalmology;  Laterality: Right;  CDE 8.68   COLONOSCOPY  09/14/2005   Dr. Leonard Schwartz colitis   COLONOSCOPY WITH ESOPHAGOGASTRODUODENOSCOPY (EGD)  04/14/2012   Dr. Gala Romney- EGD= gastric erosions of doubtful clinical significance per bx- chronic inactive gastritis, benign small bowel mucosa. TCS=colonic diverticulosis, tubular adenoma   CORONARY ARTERY BYPASS GRAFT     LIMA-LAD; SVG-OM; SVG-DX in [place]     Cervical laminectomy   PARTIAL HYSTERECTOMY     stents x4      Current Outpatient Medications  Medication Sig Dispense Refill   albuterol (VENTOLIN HFA) 108 (90 Base) MCG/ACT inhaler USE 2 INHALATIONS BY MOUTH  EVERY 6 HOURS AS NEEDED FOR WHEEZING OR SHORTNESS OF  BREATH 18 g 0   ALPRAZolam (XANAX) 0.5 MG tablet Take 1 tablet (  0.5 mg total) by mouth 2 (two) times daily as needed for anxiety. for anxiety 60 tablet 2   aspirin EC 81 MG tablet Take 81 mg by mouth daily.     calcitRIOL (ROCALTROL) 0.25 MCG capsule Take by mouth 2 (two) times a week.     clopidogrel (PLAVIX) 75 MG tablet Take 1 tablet (75 mg total) by mouth daily. 90 tablet 2   diphenoxylate-atropine (LOMOTIL) 2.5-0.025 MG tablet Take one tablet by mouth twice daily ,a s needed, for loose stool 15 tablet 0   fluticasone (FLOVENT HFA) 110 MCG/ACT inhaler Inhale 1 puff into the lungs in the morning and at bedtime. 1 each 12   isosorbide  mononitrate (IMDUR) 60 MG 24 hr tablet Take 1 tablet (60 mg total) by mouth daily. 90 tablet 2   levothyroxine (SYNTHROID) 50 MCG tablet TAKE 1 TABLET BY MOUTH  DAILY BEFORE BREAKFAST 30 tablet 11   Melatonin 10 MG TABS Take 1 tablet by mouth at bedtime as needed.     metoprolol succinate (TOPROL-XL) 50 MG 24 hr tablet Take 50 mg by mouth 2 (two) times daily. Take with or immediately following a meal.     nitroGLYCERIN (NITROSTAT) 0.4 MG SL tablet Place 1 tablet (0.4 mg total) under the tongue every 5 (five) minutes as needed. 90 tablet 3   ondansetron (ZOFRAN ODT) 4 MG disintegrating tablet Take 1 tablet (4 mg total) by mouth every 8 (eight) hours as needed for nausea or vomiting. 20 tablet 0   oxyCODONE-acetaminophen (PERCOCET/ROXICET) 5-325 MG tablet Take 1 tablet by mouth every 6 (six) hours as needed for severe pain. 15 tablet 0   pantoprazole (PROTONIX) 40 MG tablet Take 40 mg by mouth daily.     potassium chloride SA (KLOR-CON M) 20 MEQ tablet Take 20 mEq by mouth daily.     pravastatin (PRAVACHOL) 80 MG tablet TAKE 1 TABLET BY MOUTH  DAILY 90 tablet 3   tiZANidine (ZANAFLEX) 4 MG tablet Take 1 tablet (4 mg total) by mouth every 6 (six) hours as needed for muscle spasms. 30 tablet 0   torsemide (DEMADEX) 20 MG tablet Take 2 tablets (40 mg total) by mouth daily. 180 tablet 2   zolpidem (AMBIEN) 10 MG tablet Take 1 tablet (10 mg total) by mouth at bedtime as needed for sleep. 30 tablet 2   Current Facility-Administered Medications  Medication Dose Route Frequency Provider Last Rate Last Admin   sodium chloride flush (NS) 0.9 % injection 3 mL  3 mL Intravenous Q12H Satira Sark, MD       Allergies:  Patient has no known allergies.   Social History: The patient  reports that she has been smoking cigarettes. She started smoking about 2 years ago. She has a 25.00 pack-year smoking history. She has never used smokeless tobacco. She reports current alcohol use. She reports that she does not  use drugs.   Family History: The patient's family history includes Alcohol abuse in her father and mother; Coronary artery disease in an other family member; Crohn's disease (age of onset: 3) in her sister; Hypertension in an other family member; Stroke in her daughter.   ROS: No orthopnea or PND.  Physical Exam: VS:  BP (!) 158/86    Pulse (!) 50    Ht 5\' 4"  (1.626 m)    Wt 231 lb 3.2 oz (104.9 kg)    SpO2 97%    BMI 39.69 kg/m , BMI Body mass index is 39.69 kg/m.  Wt Readings from Last 3 Encounters:  04/06/21 231 lb 3.2 oz (104.9 kg)  03/13/21 230 lb (104.3 kg)  03/10/21 230 lb 1.3 oz (104.4 kg)    General: Patient appears comfortable at rest. HEENT: Conjunctiva and lids normal, wearing a mask. Neck: Supple, no elevated JVP or carotid bruits, no thyromegaly. Lungs: Clear to auscultation, nonlabored breathing at rest. Cardiac: Regular rate and rhythm, no S3, 1/6 systolic murmur, no pericardial rub. Abdomen: Soft, nontender, bowel sounds present. Extremities: 1+ leg edema, distal pulses 2+. Skin: Warm and dry. Musculoskeletal: No kyphosis. Neuropsychiatric: Alert and oriented x3, affect grossly appropriate.  ECG:  An ECG dated 03/14/2021 was personally reviewed today and demonstrated:  Sinus rhythm with poor R wave progression rule out old anterior infarct pattern.  Recent Labwork: 08/03/2020: TSH 1.140 03/13/2021: ALT 10; AST 16; BUN 15; Creatinine, Ser 1.03; Hemoglobin 11.9; Platelets 171; Potassium 3.7; Sodium 139     Component Value Date/Time   CHOL 142 08/19/2017 1505   TRIG 154 (H) 08/19/2017 1505   HDL 45 08/19/2017 1505   CHOLHDL 3.2 08/19/2017 1505   LDLCALC 66 08/19/2017 1505    Other Studies Reviewed Today:  Cardiac monitor September 2022: ZIO XT reviewed.  6 days, 8 hours analyzed.  Predominant rhythm is sinus with prolonged PR interval and heart rate ranging from 39 bpm (early morning) up to 116 bpm and average heart rate 59 bpm.  Rare PACs including couplets  and triplets were noted representing less than 1% total beats.  Rare PVCs including couplets and triplets were noted representing less than 1% total beats.  There were also limited episodes of ventricular bigeminy and trigeminy.  Multiple episodes of SVT were noted, these were generally brief with the longest lasting only 13 beats, nonsustained.  There were no ventricular arrhythmias.  No significant pauses.  Lexiscan Myoview 01/11/2021:   The study is low risk.   No ST deviation was noted. The ECG was negative for ischemia.   LV perfusion is abnormal. Defect 1: There is a small defect with mild reduction in uptake present in the apical anterior location(s) that is fixed. There is normal wall motion in the defect area. Consistent with artifact caused by breast attenuation. Defect 2: There is a small defect with mild reduction in uptake present in the mid to basal inferior location(s) that is partially reversible. There is normal wall motion in the defect area. Consistent with ischemia.   Left ventricular function is normal. Nuclear stress EF: 61 %.   Breast attenuation artifact was present.   Low risk study with evidence of breast attenuation artifact and also mild mid to basal inferior ischemia, LVEF normal at 61%.  Assessment and Plan:  1.  Progressive shortness of breath and fatigue with intermittent chest tightness concerning for accelerating angina.  She has a history of multivessel CAD status post CABG as well as DES to the circumflex with known graft disease including occlusion of the SVG to OM and SVG to diagonal.  Recent Myoview was low risk as noted above, but her symptoms persist.  We discussed proceeding to a diagnostic right and left heart catheterization to see if there are any revascularization options and to help with medication adjustments.  Risks and benefits reviewed and she is in agreement to proceed.  For now continue aspirin, Pravachol, Imdur, Plavix, and Toprol-XL.  2. HFmrEF  with LVEF 45 to 50% by assessment in February, also diastolic dysfunction.  She has been on Demadex with potassium supplement.  Right  heart catheterization also planned to assess hemodynamics and filling pressures.  3.  History of palpitations.  Did have previous postoperative atrial fibrillation last year in the postsurgical setting, however no obvious documented recurrences including by cardiac monitor.  Medication Adjustments/Labs and Tests Ordered: Current medicines are reviewed at length with the patient today.  Concerns regarding medicines are outlined above.   Tests Ordered: No orders of the defined types were placed in this encounter.   Medication Changes: No orders of the defined types were placed in this encounter.   Disposition:  Follow up  after procedure.  Signed, Satira Sark, MD, [facility] 04/06/2021 4:40 PM    [place] at [place], [place], [place] [address] Phone: [phone]; Fax: [phone]

## 2021-04-06 NOTE — Progress Notes (Signed)
Cardiology Office Note  Date: 04/06/2021   ID: [name], [name] 03-29-1946, MRN [id number]  PCP:  Lindell Spar, MD  Cardiologist:  Rozann Lesches, MD Electrophysiologist:  None   Chief Complaint  Patient presents with   Cardiac follow-up    History of Present Illness: [name] is a 75 y.o. female last seen in September.  She is here today with one of her daughters for a follow-up visit.  She states that she has remained significantly fatigued, also short of breath with low-level activity, even taking a shower.  She has intermittent chest tightness suggestive of angina, uses nitroglycerin occasionally.  No progressive sense of palpitations and no syncope.  She did wear a heart monitor back in September to exclude recurrent atrial fibrillation.  She had no evidence of atrial fibrillation but brief episodes of SVT.  Resting heart rate is well controlled on current dose of beta-blocker, this has not been uptitrated.  She also underwent a follow-up Myoview in September that was low risk with evidence of both breast attenuation and also mild mid to basal inferior ischemia.  I reviewed her medications which are noted below.  Past Medical History:  Diagnosis Date   Acute ischemic colitis ([place]) 09/11/2005   Anxiety    Arthritis    Atherosclerotic vascular disease    Calcified plaque at the origin of the celiac and SMA   CHF (congestive heart failure) (HCC)    Coronary atherosclerosis of native coronary artery    Mulitvessel LVEF 50-50%, DES circ 1/11, occluded SVG to OM and SVG to diagonal , LVEF 60%   COVID-19 virus infection 11/07/2020   Depression    Diverticulosis of colon    Essential hypertension    Hypothyroidism    Mesenteric ischemia (HCC)    Mitral regurgitation    Mild to moderate   Mixed hyperlipidemia    Myocardial infarction ([place]) 1990   Obesity    Orthostatic hypotension    PAD (peripheral artery disease) (HCC)    PVC's (premature  ventricular contractions)    Schizophrenia (HCC)    Sleep apnea    CPAP   TIA (transient ischemic attack) 11/20/2017   Tubular adenoma    Type 2 diabetes mellitus (HCC)    Vascular complications of mesenteric artery     Past Surgical History:  Procedure Laterality Date   BACK SURGERY     Lumbar spine surgery   Carotid endarectomy Bilateral    CARPAL TUNNEL RELEASE     Bilateral   CATARACT EXTRACTION W/PHACO Right 08/24/2013   Procedure: CATARACT EXTRACTION PHACO AND INTRAOCULAR LENS PLACEMENT ([place]);  Surgeon: Tonny Branch, MD;  Location: [facility];  Service: Ophthalmology;  Laterality: Right;  CDE 8.68   COLONOSCOPY  09/14/2005   Dr. Leonard Schwartz colitis   COLONOSCOPY WITH ESOPHAGOGASTRODUODENOSCOPY (EGD)  04/14/2012   Dr. Gala Romney- EGD= gastric erosions of doubtful clinical significance per bx- chronic inactive gastritis, benign small bowel mucosa. TCS=colonic diverticulosis, tubular adenoma   CORONARY ARTERY BYPASS GRAFT     LIMA-LAD; SVG-OM; SVG-DX in [place]     Cervical laminectomy   PARTIAL HYSTERECTOMY     stents x4      Current Outpatient Medications  Medication Sig Dispense Refill   albuterol (VENTOLIN HFA) 108 (90 Base) MCG/ACT inhaler USE 2 INHALATIONS BY MOUTH  EVERY 6 HOURS AS NEEDED FOR WHEEZING OR SHORTNESS OF  BREATH 18 g 0   ALPRAZolam (XANAX) 0.5 MG tablet Take 1 tablet (  0.5 mg total) by mouth 2 (two) times daily as needed for anxiety. for anxiety 60 tablet 2   aspirin EC 81 MG tablet Take 81 mg by mouth daily.     calcitRIOL (ROCALTROL) 0.25 MCG capsule Take by mouth 2 (two) times a week.     clopidogrel (PLAVIX) 75 MG tablet Take 1 tablet (75 mg total) by mouth daily. 90 tablet 2   diphenoxylate-atropine (LOMOTIL) 2.5-0.025 MG tablet Take one tablet by mouth twice daily ,a s needed, for loose stool 15 tablet 0   fluticasone (FLOVENT HFA) 110 MCG/ACT inhaler Inhale 1 puff into the lungs in the morning and at bedtime. 1 each 12   isosorbide  mononitrate (IMDUR) 60 MG 24 hr tablet Take 1 tablet (60 mg total) by mouth daily. 90 tablet 2   levothyroxine (SYNTHROID) 50 MCG tablet TAKE 1 TABLET BY MOUTH  DAILY BEFORE BREAKFAST 30 tablet 11   Melatonin 10 MG TABS Take 1 tablet by mouth at bedtime as needed.     metoprolol succinate (TOPROL-XL) 50 MG 24 hr tablet Take 50 mg by mouth 2 (two) times daily. Take with or immediately following a meal.     nitroGLYCERIN (NITROSTAT) 0.4 MG SL tablet Place 1 tablet (0.4 mg total) under the tongue every 5 (five) minutes as needed. 90 tablet 3   ondansetron (ZOFRAN ODT) 4 MG disintegrating tablet Take 1 tablet (4 mg total) by mouth every 8 (eight) hours as needed for nausea or vomiting. 20 tablet 0   oxyCODONE-acetaminophen (PERCOCET/ROXICET) 5-325 MG tablet Take 1 tablet by mouth every 6 (six) hours as needed for severe pain. 15 tablet 0   pantoprazole (PROTONIX) 40 MG tablet Take 40 mg by mouth daily.     potassium chloride SA (KLOR-CON M) 20 MEQ tablet Take 20 mEq by mouth daily.     pravastatin (PRAVACHOL) 80 MG tablet TAKE 1 TABLET BY MOUTH  DAILY 90 tablet 3   tiZANidine (ZANAFLEX) 4 MG tablet Take 1 tablet (4 mg total) by mouth every 6 (six) hours as needed for muscle spasms. 30 tablet 0   torsemide (DEMADEX) 20 MG tablet Take 2 tablets (40 mg total) by mouth daily. 180 tablet 2   zolpidem (AMBIEN) 10 MG tablet Take 1 tablet (10 mg total) by mouth at bedtime as needed for sleep. 30 tablet 2   Current Facility-Administered Medications  Medication Dose Route Frequency Provider Last Rate Last Admin   sodium chloride flush (NS) 0.9 % injection 3 mL  3 mL Intravenous Q12H Satira Sark, MD       Allergies:  Patient has no known allergies.   Social History: The patient  reports that she has been smoking cigarettes. She started smoking about 2 years ago. She has a 25.00 pack-year smoking history. She has never used smokeless tobacco. She reports current alcohol use. She reports that she does not  use drugs.   Family History: The patient's family history includes Alcohol abuse in her father and mother; Coronary artery disease in an other family member; Crohn's disease (age of onset: 63) in her sister; Hypertension in an other family member; Stroke in her daughter.   ROS: No orthopnea or PND.  Physical Exam: VS:  BP (!) 158/86   Pulse (!) 50   Ht 5\' 4"  (1.626 m)   Wt 231 lb 3.2 oz (104.9 kg)   SpO2 97%   BMI 39.69 kg/m , BMI Body mass index is 39.69 kg/m.  Wt Readings from Last 3  Encounters:  04/06/21 231 lb 3.2 oz (104.9 kg)  03/13/21 230 lb (104.3 kg)  03/10/21 230 lb 1.3 oz (104.4 kg)    General: Patient appears comfortable at rest. HEENT: Conjunctiva and lids normal, wearing a mask. Neck: Supple, no elevated JVP or carotid bruits, no thyromegaly. Lungs: Clear to auscultation, nonlabored breathing at rest. Cardiac: Regular rate and rhythm, no S3, 1/6 systolic murmur, no pericardial rub. Abdomen: Soft, nontender, bowel sounds present. Extremities: 1+ leg edema, distal pulses 2+. Skin: Warm and dry. Musculoskeletal: No kyphosis. Neuropsychiatric: Alert and oriented x3, affect grossly appropriate.  ECG:  An ECG dated 03/14/2021 was personally reviewed today and demonstrated:  Sinus rhythm with poor R wave progression rule out old anterior infarct pattern.  Recent Labwork: 08/03/2020: TSH 1.140 03/13/2021: ALT 10; AST 16; BUN 15; Creatinine, Ser 1.03; Hemoglobin 11.9; Platelets 171; Potassium 3.7; Sodium 139     Component Value Date/Time   CHOL 142 08/19/2017 1505   TRIG 154 (H) 08/19/2017 1505   HDL 45 08/19/2017 1505   CHOLHDL 3.2 08/19/2017 1505   LDLCALC 66 08/19/2017 1505    Other Studies Reviewed Today:  Cardiac monitor September 2022: ZIO XT reviewed.  6 days, 8 hours analyzed.  Predominant rhythm is sinus with prolonged PR interval and heart rate ranging from 39 bpm (early morning) up to 116 bpm and average heart rate 59 bpm.  Rare PACs including couplets  and triplets were noted representing less than 1% total beats.  Rare PVCs including couplets and triplets were noted representing less than 1% total beats.  There were also limited episodes of ventricular bigeminy and trigeminy.  Multiple episodes of SVT were noted, these were generally brief with the longest lasting only 13 beats, nonsustained.  There were no ventricular arrhythmias.  No significant pauses.  Lexiscan Myoview 01/11/2021:   The study is low risk.   No ST deviation was noted. The ECG was negative for ischemia.   LV perfusion is abnormal. Defect 1: There is a small defect with mild reduction in uptake present in the apical anterior location(s) that is fixed. There is normal wall motion in the defect area. Consistent with artifact caused by breast attenuation. Defect 2: There is a small defect with mild reduction in uptake present in the mid to basal inferior location(s) that is partially reversible. There is normal wall motion in the defect area. Consistent with ischemia.   Left ventricular function is normal. Nuclear stress EF: 61 %.   Breast attenuation artifact was present.   Low risk study with evidence of breast attenuation artifact and also mild mid to basal inferior ischemia, LVEF normal at 61%.  Assessment and Plan:  1.  Progressive shortness of breath and fatigue with intermittent chest tightness concerning for accelerating angina.  She has a history of multivessel CAD status post CABG as well as DES to the circumflex with known graft disease including occlusion of the SVG to OM and SVG to diagonal.  Recent Myoview was low risk as noted above, but her symptoms persist.  We discussed proceeding to a diagnostic right and left heart catheterization to see if there are any revascularization options and to help with medication adjustments.  Risks and benefits reviewed and she is in agreement to proceed.  For now continue aspirin, Pravachol, Imdur, Plavix, and Toprol-XL.  2. HFmrEF  with LVEF 45 to 50% by assessment in February, also diastolic dysfunction.  She has been on Demadex with potassium supplement.  Right heart catheterization also planned to  assess hemodynamics and filling pressures.  3.  History of palpitations.  Did have previous postoperative atrial fibrillation last year in the postsurgical setting, however no obvious documented recurrences including by cardiac monitor.  Medication Adjustments/Labs and Tests Ordered: Current medicines are reviewed at length with the patient today.  Concerns regarding medicines are outlined above.   Tests Ordered: No orders of the defined types were placed in this encounter.   Medication Changes: No orders of the defined types were placed in this encounter.   Disposition:  Follow up  after procedure.  Signed, Satira Sark, MD, [facility] 04/06/2021 4:40 PM    [place] at [place], [place], [place] [address] Phone: [phone]; Fax: [phone]

## 2021-04-07 ENCOUNTER — Telehealth: Payer: Self-pay | Admitting: Cardiology

## 2021-04-07 NOTE — Telephone Encounter (Signed)
Office visit plan and instructions discussed with Joelene Millin

## 2021-04-07 NOTE — Telephone Encounter (Signed)
Joelene Millin is calling requesting a callback to discuss everything that was said at the patient's appointment yesterday due to her not being able to attend.

## 2021-04-11 ENCOUNTER — Encounter: Payer: Self-pay | Admitting: Internal Medicine

## 2021-04-11 ENCOUNTER — Telehealth: Payer: Self-pay | Admitting: *Deleted

## 2021-04-11 NOTE — Telephone Encounter (Signed)
Cardiac catheterization scheduled at [facility] for: Wednesday April 12, 2021 12:30 PM [facility] Main Entrance A [facility]) at: 10:30 AM   Diet-no solid food after midnight prior to cath, clear liquids until 5 AM day of procedure  Medication instructions for procedure: -Hold:  Torsemide/KCl-day before and day of procedure-per protocol GFR 56 -Except hold medication usual morning medications can be taken pre-cath with sips of water including aspirin 81 mg and Plavix 75 mg.     Confirmed patient has responsible adult to drive home post procedure and be with patient first 24 hours after arriving home.  [facility] does allow one visitor to accompany you and wait in the hospital waiting room while you are there for your procedure. You and your visitor will be asked to wear a mask once you enter the hospital.   Patient reports does not currently have any new symptoms concerning for COVID-19 and no household members with COVID-19 like illness.    Reviewed procedure/mask/visitor instructions with patient's granddaughter (DPR), Joelene Millin.

## 2021-04-12 ENCOUNTER — Ambulatory Visit (HOSPITAL_COMMUNITY)
Admission: RE | Admit: 2021-04-12 | Discharge: 2021-04-12 | Disposition: A | Discharge status: Home / Self Care | Payer: Medicare Other | Attending: Cardiovascular Disease | Admitting: Cardiovascular Disease

## 2021-04-12 ENCOUNTER — Encounter (HOSPITAL_COMMUNITY)
Admission: RE | Disposition: A | Discharge status: Home / Self Care | Payer: Self-pay | Source: Home / Self Care | Attending: Cardiovascular Disease

## 2021-04-12 ENCOUNTER — Other Ambulatory Visit: Payer: Self-pay

## 2021-04-12 DIAGNOSIS — I25708 Atherosclerosis of coronary artery bypass graft(s), unspecified, with other forms of angina pectoris: Secondary | ICD-10-CM | POA: Diagnosis present

## 2021-04-12 DIAGNOSIS — Z79899 Other long term (current) drug therapy: Secondary | ICD-10-CM | POA: Insufficient documentation

## 2021-04-12 DIAGNOSIS — I2582 Chronic total occlusion of coronary artery: Secondary | ICD-10-CM | POA: Diagnosis not present

## 2021-04-12 DIAGNOSIS — Z7982 Long term (current) use of aspirin: Secondary | ICD-10-CM | POA: Diagnosis not present

## 2021-04-12 DIAGNOSIS — I200 Unstable angina: Secondary | ICD-10-CM

## 2021-04-12 DIAGNOSIS — Z7902 Long term (current) use of antithrombotics/antiplatelets: Secondary | ICD-10-CM | POA: Diagnosis not present

## 2021-04-12 DIAGNOSIS — Z955 Presence of coronary angioplasty implant and graft: Secondary | ICD-10-CM | POA: Diagnosis not present

## 2021-04-12 HISTORY — PX: RIGHT/LEFT HEART CATH AND CORONARY/GRAFT ANGIOGRAPHY: CATH118267

## 2021-04-12 LAB — POCT I-STAT EG7
Acid-base deficit: 1 mmol/L (ref 0.0–2.0)
Bicarbonate: 25.9 mmol/L (ref 20.0–28.0)
Calcium, Ion: 1.26 mmol/L (ref 1.15–1.40)
HCT: 32 % — ABNORMAL LOW (ref 36.0–46.0)
Hemoglobin: 10.9 g/dL — ABNORMAL LOW (ref 12.0–15.0)
O2 Saturation: 73 %
Potassium: 3.7 mmol/L (ref 3.5–5.1)
Sodium: 145 mmol/L (ref 135–145)
TCO2: 27 mmol/L (ref 22–32)
pCO2, Ven: 50 mmHg (ref 44.0–60.0)
pH, Ven: 7.322 (ref 7.250–7.430)
pO2, Ven: 42 mmHg (ref 32.0–45.0)

## 2021-04-12 LAB — POCT I-STAT 7, (LYTES, BLD GAS, ICA,H+H)
Acid-base deficit: 1 mmol/L (ref 0.0–2.0)
Bicarbonate: 25.1 mmol/L (ref 20.0–28.0)
Calcium, Ion: 1.22 mmol/L (ref 1.15–1.40)
HCT: 31 % — ABNORMAL LOW (ref 36.0–46.0)
Hemoglobin: 10.5 g/dL — ABNORMAL LOW (ref 12.0–15.0)
O2 Saturation: 100 %
Potassium: 3.7 mmol/L (ref 3.5–5.1)
Sodium: 144 mmol/L (ref 135–145)
TCO2: 27 mmol/L (ref 22–32)
pCO2 arterial: 46.7 mmHg (ref 32.0–48.0)
pH, Arterial: 7.338 — ABNORMAL LOW (ref 7.350–7.450)
pO2, Arterial: 186 mmHg — ABNORMAL HIGH (ref 83.0–108.0)

## 2021-04-12 LAB — GLUCOSE, CAPILLARY: Glucose-Capillary: 116 mg/dL — ABNORMAL HIGH (ref 70–99)

## 2021-04-12 SURGERY — RIGHT/LEFT HEART CATH AND CORONARY/GRAFT ANGIOGRAPHY
Anesthesia: LOCAL

## 2021-04-12 MED ORDER — ONDANSETRON HCL 4 MG/2ML IJ SOLN: 4.0000 mg | Freq: Four times a day (QID) | INTRAMUSCULAR | Status: DC | PRN

## 2021-04-12 MED ORDER — HEPARIN (PORCINE) IN NACL 2000-0.9 UNIT/L-% IV SOLN
INTRAVENOUS | Status: AC
  Filled 2021-04-12: qty 1000

## 2021-04-12 MED ORDER — FENTANYL CITRATE (PF) 100 MCG/2ML IJ SOLN
INTRAMUSCULAR | Status: AC
  Filled 2021-04-12: qty 2

## 2021-04-12 MED ORDER — SODIUM CHLORIDE 0.9 % IV SOLN: 250.0000 mL | INTRAVENOUS | Status: DC | PRN

## 2021-04-12 MED ORDER — MIDAZOLAM HCL 2 MG/2ML IJ SOLN
INTRAMUSCULAR | Status: AC
  Filled 2021-04-12: qty 2

## 2021-04-12 MED ORDER — ACETAMINOPHEN 325 MG PO TABS: 650.0000 mg | ORAL_TABLET | ORAL | Status: DC | PRN

## 2021-04-12 MED ORDER — VERAPAMIL HCL 2.5 MG/ML IV SOLN
INTRAVENOUS | Status: AC
  Filled 2021-04-12: qty 2

## 2021-04-12 MED ORDER — SODIUM CHLORIDE 0.9 % IV SOLN: INTRAVENOUS | Status: DC

## 2021-04-12 MED ORDER — ASPIRIN 81 MG PO CHEW: 81.0000 mg | CHEWABLE_TABLET | ORAL | Status: DC

## 2021-04-12 MED ORDER — LIDOCAINE HCL (PF) 1 % IJ SOLN
INTRAMUSCULAR | Status: AC
  Filled 2021-04-12: qty 30

## 2021-04-12 MED ORDER — LIDOCAINE HCL (PF) 1 % IJ SOLN
INTRAMUSCULAR | Status: DC | PRN
  Administered 2021-04-12: 2 mL

## 2021-04-12 MED ORDER — FENTANYL CITRATE (PF) 100 MCG/2ML IJ SOLN
INTRAMUSCULAR | Status: DC | PRN
  Administered 2021-04-12 (×2): 25 ug via INTRAVENOUS

## 2021-04-12 MED ORDER — SODIUM CHLORIDE 0.9 % WEIGHT BASED INFUSION: 1.0000 mL/kg/h | INTRAVENOUS | Status: DC

## 2021-04-12 MED ORDER — SODIUM CHLORIDE 0.9% FLUSH: 3.0000 mL | INTRAVENOUS | Status: DC | PRN

## 2021-04-12 MED ORDER — SODIUM CHLORIDE 0.9% FLUSH: 3.0000 mL | Freq: Two times a day (BID) | INTRAVENOUS | Status: DC

## 2021-04-12 MED ORDER — HEPARIN (PORCINE) IN NACL 2000-0.9 UNIT/L-% IV SOLN
INTRAVENOUS | Status: DC | PRN
  Administered 2021-04-12 (×2): 1000 mL

## 2021-04-12 MED ORDER — SODIUM CHLORIDE 0.9 % WEIGHT BASED INFUSION
3.0000 mL/kg/h | INTRAVENOUS | Status: AC
  Administered 2021-04-12: 11:00:00 3 mL/kg/h via INTRAVENOUS

## 2021-04-12 MED ORDER — HEPARIN SODIUM (PORCINE) 1000 UNIT/ML IJ SOLN
INTRAMUSCULAR | Status: DC | PRN
  Administered 2021-04-12: 5000 [IU] via INTRAVENOUS

## 2021-04-12 MED ORDER — IOHEXOL 350 MG/ML SOLN
INTRAVENOUS | Status: DC | PRN
  Administered 2021-04-12: 14:00:00 55 mL

## 2021-04-12 MED ORDER — MIDAZOLAM HCL 2 MG/2ML IJ SOLN
INTRAMUSCULAR | Status: DC | PRN
  Administered 2021-04-12: 1 mg via INTRAVENOUS

## 2021-04-12 MED ORDER — VERAPAMIL HCL 2.5 MG/ML IV SOLN
INTRAVENOUS | Status: DC | PRN
  Administered 2021-04-12: 14:00:00 10 mL via INTRA_ARTERIAL

## 2021-04-12 SURGICAL SUPPLY — 12 items
CATH BALLN WEDGE 5F 110CM (CATHETERS) ×1 IMPLANT
CATH OPTITORQUE TIG 4.0 5F (CATHETERS) ×1 IMPLANT
DEVICE RAD COMP TR BAND LRG (VASCULAR PRODUCTS) ×1 IMPLANT
GLIDESHEATH SLEND SS 6F .021 (SHEATH) ×1 IMPLANT
GUIDEWIRE INQWIRE 1.5J.035X260 (WIRE) IMPLANT
INQWIRE 1.5J .035X260CM (WIRE) ×2
KIT HEART LEFT (KITS) ×3 IMPLANT
PACK CARDIAC CATHETERIZATION (CUSTOM PROCEDURE TRAY) ×3 IMPLANT
SHEATH GLIDE SLENDER 4/5FR (SHEATH) ×1 IMPLANT
SHEATH PROBE COVER 6X72 (BAG) ×1 IMPLANT
TRANSDUCER W/STOPCOCK (MISCELLANEOUS) ×3 IMPLANT
TUBING CIL FLEX 10 FLL-RA (TUBING) ×3 IMPLANT

## 2021-04-12 NOTE — Progress Notes (Signed)
Pt ambulated without difficulty or bleeding.   Discharged home with daughter who will drive and stay with pt x 24 hrs 

## 2021-04-12 NOTE — Interval H&P Note (Signed)
Cath Lab Visit (complete for each Cath Lab visit)  Clinical Evaluation Leading to the Procedure:   ACS: No.  Non-ACS:    Anginal Classification: CCS III  Anti-ischemic medical therapy: Maximal Therapy (2 or more classes of medications)  Non-Invasive Test Results: Low-risk stress test findings: cardiac mortality <1%/year  Prior CABG: Previous CABG      History and Physical Interval Note:  04/12/2021 1:36 PM  [name]  has presented today for surgery, with the diagnosis of accelerating angina.  The various methods of treatment have been discussed with the patient and family. After consideration of risks, benefits and other options for treatment, the patient has consented to  Procedure(s): RIGHT/LEFT HEART CATH AND CORONARY/GRAFT ANGIOGRAPHY (N/A) as a surgical intervention.  The patient's history has been reviewed, patient examined, no change in status, stable for surgery.  I have reviewed the patient's chart and labs.  Questions were answered to the patient's satisfaction.     Kathlyn [place]

## 2021-04-13 ENCOUNTER — Encounter (HOSPITAL_COMMUNITY): Payer: Self-pay | Admitting: Cardiovascular Disease

## 2021-04-13 ENCOUNTER — Other Ambulatory Visit: Payer: Self-pay | Admitting: Internal Medicine

## 2021-04-13 ENCOUNTER — Telehealth: Payer: Self-pay | Admitting: Internal Medicine

## 2021-04-13 ENCOUNTER — Ambulatory Visit: Payer: Medicare Other | Admitting: Internal Medicine

## 2021-04-13 DIAGNOSIS — F419 Anxiety disorder, unspecified: Secondary | ICD-10-CM

## 2021-04-13 NOTE — Telephone Encounter (Signed)
LVM letting pt daughter know to just request refills

## 2021-04-13 NOTE — Telephone Encounter (Signed)
Pt needs refills on   ALPRAZolam (XANAX) 0.5 MG tablet    [facility]

## 2021-04-16 NOTE — Progress Notes (Signed)
Cardiology Office Note  Date: 04/17/2021   [name], DOB 1945/09/29, MRN [id number]  PCP:  Lindell Spar, MD  Cardiologist:  Rozann Lesches, MD Electrophysiologist:  None   Chief Complaint  Patient presents with   Cardiac follow-up    History of Present Illness: [name] is a 75 y.o. female seen recently with shortness of breath and fatigue as well as intermittent angina symptoms, referred for a follow-up diagnostic cardiac catheterization to assess for any potential revascularization options.  Procedure was performed by Dr. Fletcher Anon on December 14 demonstrating severe multivessel native CAD with patent LIMA to LAD, occluded SVG to OM, occluded SVG to diagonal, and occluded SVG to PDA.  Circumflex stent extending into OM1 showed moderate in-stent restenosis with subtotal occlusion in the AV groove which was old.  There were good left-to-right collaterals to the RCA and there were no reasonable options for PCI with plan to continue medical therapy.  She is here today with her daughter for a follow-up visit.  I reviewed her home blood pressure and heart rate checks.  She is tolerating present medications well although blood pressure is not optimal.  We discussed addition of Norvasc (she had been on this in the past).  Hopefully will provide better blood pressure control and serve as an antianginal as well.  Past Medical History:  Diagnosis Date   Acute ischemic colitis ([place]) 09/11/2005   Anxiety    Arthritis    Atherosclerotic vascular disease    Calcified plaque at the origin of the celiac and SMA   CHF (congestive heart failure) (HCC)    Coronary atherosclerosis of native coronary artery    Mulitvessel LVEF 50-50%, DES circ 1/11, occluded SVG to OM and SVG to diagonal , LVEF 60%   COVID-19 virus infection 11/07/2020   Depression    Diverticulosis of colon    Essential hypertension    Hypothyroidism    Mesenteric ischemia (HCC)    Mitral regurgitation     Mild to moderate   Mixed hyperlipidemia    Myocardial infarction ([place]) 1990   Obesity    Orthostatic hypotension    PAD (peripheral artery disease) (HCC)    PVC's (premature ventricular contractions)    Schizophrenia (HCC)    Sleep apnea    CPAP   TIA (transient ischemic attack) 11/20/2017   Tubular adenoma    Type 2 diabetes mellitus (HCC)    Vascular complications of mesenteric artery     Past Surgical History:  Procedure Laterality Date   BACK SURGERY     Lumbar spine surgery   Carotid endarectomy Bilateral    CARPAL TUNNEL RELEASE     Bilateral   CATARACT EXTRACTION W/PHACO Right 08/24/2013   Procedure: CATARACT EXTRACTION PHACO AND INTRAOCULAR LENS PLACEMENT ([place]);  Surgeon: Tonny Branch, MD;  Location: [facility];  Service: Ophthalmology;  Laterality: Right;  CDE 8.68   COLONOSCOPY  09/14/2005   Dr. Leonard Schwartz colitis   COLONOSCOPY WITH ESOPHAGOGASTRODUODENOSCOPY (EGD)  04/14/2012   Dr. Gala Romney- EGD= gastric erosions of doubtful clinical significance per bx- chronic inactive gastritis, benign small bowel mucosa. TCS=colonic diverticulosis, tubular adenoma   CORONARY ARTERY BYPASS GRAFT     LIMA-LAD; SVG-OM; SVG-DX in [place]     Cervical laminectomy   PARTIAL HYSTERECTOMY     RIGHT/LEFT HEART CATH AND CORONARY/GRAFT ANGIOGRAPHY N/A 04/12/2021   Procedure: RIGHT/LEFT HEART CATH AND CORONARY/GRAFT ANGIOGRAPHY;  Surgeon: Wellington Hampshire, MD;  Location: [facility];  Service: Cardiovascular;  Laterality: N/A;   stents x4      Current Outpatient Medications  Medication Sig Dispense Refill   albuterol (VENTOLIN HFA) 108 (90 Base) MCG/ACT inhaler USE 2 INHALATIONS BY MOUTH  EVERY 6 HOURS AS NEEDED FOR WHEEZING OR SHORTNESS OF  BREATH (Patient taking differently: 1 puff every 6 (six) hours as needed for shortness of breath or wheezing.) 18 g 0   ALPRAZolam (XANAX) 0.5 MG tablet Take 1 tablet (0.5 mg total) by mouth 2 (two) times daily as needed for  anxiety. for anxiety 60 tablet 2   aspirin EC 81 MG tablet Take 81 mg by mouth at bedtime.     Biotin 10000 MCG TABS Take 1 tablet by mouth daily.     calcitRIOL (ROCALTROL) 0.25 MCG capsule Take 0.25 mcg by mouth 2 (two) times a week.     Cholecalciferol (VITAMIN D3) 50 MCG (2000 UT) TABS Take 1 tablet by mouth daily.     clopidogrel (PLAVIX) 75 MG tablet Take 1 tablet (75 mg total) by mouth daily. (Patient taking differently: Take 75 mg by mouth every evening.) 90 tablet 2   Collagen-Vitamin C (COLLAGEN PLUS VITAMIN C PO) Take 1 tablet by mouth daily.     diphenoxylate-atropine (LOMOTIL) 2.5-0.025 MG tablet Take one tablet by mouth twice daily ,a s needed, for loose stool 15 tablet 0   docusate sodium (COLACE) 100 MG capsule Take 100 mg by mouth as needed for mild constipation.     FIBER COMPLETE PO Take 2 tablets by mouth daily. Fiber Well gummies     fluticasone (FLOVENT HFA) 110 MCG/ACT inhaler Inhale 1 puff into the lungs in the morning and at bedtime. (Patient taking differently: Inhale 2 puffs into the lungs in the morning and at bedtime.) 1 each 12   isosorbide mononitrate (IMDUR) 60 MG 24 hr tablet Take 1 tablet (60 mg total) by mouth daily. 90 tablet 2   levothyroxine (SYNTHROID) 50 MCG tablet TAKE 1 TABLET BY MOUTH  DAILY BEFORE BREAKFAST 30 tablet 11   Melatonin 10 MG TABS Take 10 mg by mouth at bedtime as needed (sleep).     metoprolol tartrate (LOPRESSOR) 50 MG tablet Take 50 mg by mouth 2 (two) times daily.     nitroGLYCERIN (NITROSTAT) 0.4 MG SL tablet Place 1 tablet (0.4 mg total) under the tongue every 5 (five) minutes as needed. (Patient taking differently: Place 0.4 mg under the tongue every 5 (five) minutes as needed for chest pain.) 90 tablet 3   ondansetron (ZOFRAN ODT) 4 MG disintegrating tablet Take 1 tablet (4 mg total) by mouth every 8 (eight) hours as needed for nausea or vomiting. 20 tablet 0   oxyCODONE-acetaminophen (PERCOCET/ROXICET) 5-325 MG tablet Take 1 tablet by  mouth every 6 (six) hours as needed for severe pain. 15 tablet 0   pantoprazole (PROTONIX) 40 MG tablet Take 40 mg by mouth in the morning.     Potassium 99 MG TABS Take 99 mg by mouth daily.     pravastatin (PRAVACHOL) 80 MG tablet TAKE 1 TABLET BY MOUTH  DAILY (Patient taking differently: Take 80 mg by mouth every evening.) 90 tablet 3   torsemide (DEMADEX) 20 MG tablet Take 2 tablets (40 mg total) by mouth daily. (Patient taking differently: Take 40 mg by mouth daily. Takes occasionally - 04/17/2021) 180 tablet 2   traMADol (ULTRAM) 50 MG tablet Take 50 mg by mouth as needed.     zolpidem (AMBIEN) 10  MG tablet Take 1 tablet (10 mg total) by mouth at bedtime as needed for sleep. 30 tablet 2   amLODipine (NORVASC) 2.5 MG tablet Take 1 tablet (2.5 mg total) by mouth daily. 90 tablet 1   Current Facility-Administered Medications  Medication Dose Route Frequency Provider Last Rate Last Admin   sodium chloride flush (NS) 0.9 % injection 3 mL  3 mL Intravenous Q12H Satira Sark, MD       Allergies:  Patient has no known allergies.   ROS: Hearing loss.  No palpitations or syncope.  Physical Exam: VS:  BP (!) 158/88    Pulse (!) 51    Ht 5\' 2"  (1.575 m)    Wt 233 lb 9.6 oz (106 kg)    SpO2 94%    BMI 42.73 kg/m , BMI Body mass index is 42.73 kg/m.  Wt Readings from Last 3 Encounters:  04/17/21 233 lb 9.6 oz (106 kg)  04/12/21 230 lb (104.3 kg)  04/06/21 231 lb 3.2 oz (104.9 kg)    General: Patient appears comfortable at rest. HEENT: Conjunctiva and lids normal, wearing a mask. Neck: Supple, no elevated JVP or carotid bruits, no thyromegaly. Lungs: Clear to auscultation, nonlabored breathing at rest. Cardiac: Regular rate and rhythm, no S3, 1/6 systolic murmur. Extremities: No pitting edema.  ECG:  An ECG dated 03/14/2021 was personally reviewed today and demonstrated:  Sinus rhythm with PAC and old anterior infarct pattern.  Recent Labwork: 08/03/2020: TSH 1.140 03/13/2021: ALT  10; AST 16; BUN 15; Creatinine, Ser 1.03; Platelets 171 04/12/2021: Hemoglobin 10.9; Potassium 3.7; Sodium 145     Component Value Date/Time   CHOL 142 08/19/2017 1505   TRIG 154 (H) 08/19/2017 1505   HDL 45 08/19/2017 1505   CHOLHDL 3.2 08/19/2017 1505   LDLCALC 66 08/19/2017 1505    Other Studies Reviewed Today:  Echocardiogram 06/14/2020:  1. Abnormal septal motion distal septal apical mid and basal inferior  wall hypokinesis . Left ventricular ejection fraction, by estimation, is  45 to 50%. The left ventricle has mildly decreased function. The left  ventricle has no regional wall motion  abnormalities. The left ventricular internal cavity size was mildly  dilated. There is moderate left ventricular hypertrophy. Left ventricular  diastolic parameters are consistent with Grade I diastolic dysfunction  (impaired relaxation).   2. Right ventricular systolic function is normal. The right ventricular  size is normal.   3. Left atrial size was mildly dilated.   4. The mitral valve is degenerative. Mild mitral valve regurgitation. No  evidence of mitral stenosis.   5. The aortic valve is tricuspid. Aortic valve regurgitation is not  visualized. Mild aortic valve sclerosis is present, with no evidence of  aortic valve stenosis.   6. The inferior vena cava is normal in size with greater than 50%  respiratory variability, suggesting right atrial pressure of 3 mmHg.   Cardiac catheterization 04/12/2021:   Ost LM to Mid LM lesion is 40% stenosed.   1st Mrg lesion is 50% stenosed.   Prox Cx lesion is 50% stenosed.   Ost LAD to Prox LAD lesion is 90% stenosed.   Prox Cx to Mid Cx lesion is 99% stenosed.   Prox RCA to Mid RCA lesion is 100% stenosed.   Prox RCA lesion is 80% stenosed.   Origin lesion is 100% stenosed.   Origin to Prox Graft lesion is 100% stenosed.   LIMA graft was visualized by angiography and is normal in caliber.  1.  Severe underlying three-vessel coronary  artery disease with patent LIMA to LAD.  Chronically occluded SVG to OM and SVG to diagonal.  The SVG to right PDA which was patent on most recent angiography in 2011 is now occluded.  Left circumflex stent from the proximal portion extending into OM1 is patent with moderate in-stent restenosis and subtotal occlusion of the AV groove left circumflex which was present before. 2.  Right heart catheterization showed high normal filling pressures, no pulmonary hypertension and normal cardiac output.   Recommendations: The SVG to right PDA is now occluded which is new but the RCA has good left-to-right collaterals.  The AV groove left circumflex was subtotally occluded before and jailed by previous stent.  This could not be crossed with a wire before. Recommend continuing medical therapy.  Assessment and Plan:  1.  Multivessel CAD status post CABG and DES to the circumflex.  Recent cardiac catheterization shows progressive graft disease with no target for PCI and plan for medical therapy.  We discussed the results of her procedure today.  Continue aspirin, Plavix, Imdur, Lopressor, and Pravachol.  Starting Norvasc 2.5 mg daily with up titration as tolerated.  2.  Essential hypertension, blood pressure control not optimal.  Norvasc is being added as discussed above.  3. HFmrEF with LVEF 45 to 50%.  She is on Demadex with potassium supplement, recent right heart catheterization showed high normal filling pressures with normal cardiac output and no pulmonary hypertension.  Medication Adjustments/Labs and Tests Ordered: Current medicines are reviewed at length with the patient today.  Concerns regarding medicines are outlined above.   Tests Ordered: No orders of the defined types were placed in this encounter.   Medication Changes: Meds ordered this encounter  Medications   DISCONTD: amLODipine (NORVASC) 2.5 MG tablet    Sig: Take 1 tablet (2.5 mg total) by mouth daily.    Dispense:  30 tablet     Refill:  0    04/17/2021 NEW   amLODipine (NORVASC) 2.5 MG tablet    Sig: Take 1 tablet (2.5 mg total) by mouth daily.    Dispense:  90 tablet    Refill:  1    04/17/2021 NEW    Disposition:  Follow up  6 to 8 weeks.  Signed, Satira Sark, MD, [facility] 04/17/2021 11:12 AM    [facility], [place], [place] [address] Phone: [phone]; Fax: [phone]

## 2021-04-17 ENCOUNTER — Encounter: Payer: Self-pay | Admitting: Cardiology

## 2021-04-17 ENCOUNTER — Ambulatory Visit: Payer: Medicare Other | Admitting: Cardiology

## 2021-04-17 VITALS — BP 158/88 | HR 51 | Ht 62.0 in | Wt 233.6 lb

## 2021-04-17 DIAGNOSIS — I25119 Atherosclerotic heart disease of native coronary artery with unspecified angina pectoris: Secondary | ICD-10-CM | POA: Diagnosis not present

## 2021-04-17 DIAGNOSIS — I10 Essential (primary) hypertension: Secondary | ICD-10-CM | POA: Diagnosis not present

## 2021-04-17 MED ORDER — AMLODIPINE BESYLATE 2.5 MG PO TABS: 2.5000 mg | ORAL_TABLET | Freq: Every day | ORAL | 0 refills | Status: DC

## 2021-04-17 MED ORDER — AMLODIPINE BESYLATE 2.5 MG PO TABS: 2.5000 mg | ORAL_TABLET | Freq: Every day | ORAL | 1 refills | Status: DC

## 2021-04-17 NOTE — Patient Instructions (Addendum)
Medication Instructions:  Your physician has recommended you make the following change in your medication:  Start amlodipine 2.5 mg daily Continue other medications the same  Labwork: none  Testing/Procedures: none  Follow-Up: Your physician recommends that you schedule a follow-up appointment in: 6-8 weeks  Any Other Special Instructions Will Be Listed Below (If Applicable).  If you need a refill on your cardiac medications before your next appointment, please call your pharmacy.

## 2021-04-19 ENCOUNTER — Institutional Professional Consult (permissible substitution): Payer: Medicare Other | Admitting: Pulmonary Disease

## 2021-05-03 ENCOUNTER — Ambulatory Visit: Payer: Medicare Other | Admitting: Cardiology

## 2021-05-09 ENCOUNTER — Other Ambulatory Visit: Payer: Self-pay | Admitting: Cardiology

## 2021-05-10 ENCOUNTER — Other Ambulatory Visit: Payer: Self-pay | Admitting: *Deleted

## 2021-05-10 ENCOUNTER — Other Ambulatory Visit: Payer: Self-pay | Admitting: Cardiology

## 2021-05-10 ENCOUNTER — Ambulatory Visit: Payer: Medicare Other | Admitting: Internal Medicine

## 2021-05-10 ENCOUNTER — Telehealth: Payer: Self-pay

## 2021-05-10 DIAGNOSIS — E039 Hypothyroidism, unspecified: Secondary | ICD-10-CM

## 2021-05-10 MED ORDER — LEVOTHYROXINE SODIUM 50 MCG PO TABS: 50.0000 ug | ORAL_TABLET | Freq: Every day | ORAL | 11 refills | Status: DC

## 2021-05-10 MED ORDER — PRAVASTATIN SODIUM 80 MG PO TABS: 80.0000 mg | ORAL_TABLET | Freq: Every day | ORAL | 3 refills | Status: DC

## 2021-05-10 MED ORDER — PANTOPRAZOLE SODIUM 40 MG PO TBEC: 40.0000 mg | DELAYED_RELEASE_TABLET | Freq: Every morning | ORAL | 0 refills | Status: DC

## 2021-05-10 MED ORDER — METOPROLOL TARTRATE 50 MG PO TABS: 50.0000 mg | ORAL_TABLET | Freq: Two times a day (BID) | ORAL | 0 refills | Status: DC

## 2021-05-10 NOTE — Telephone Encounter (Signed)
Please let pt daughter know pravastatin levothyroxine metoproplol and protonix was sent by our office all other medications have not been filled by [place] most were cardiology will need to call that office for refills

## 2021-05-10 NOTE — Telephone Encounter (Signed)
Patient daughter Joelene Millin called needs medication list [facility].  List daughter gave me list of meds. Drug Store will be cheaper or free instead of her pharmacy. Please have clinic nurse contact patient daughter back once all this medication been sent into the pharmacy.  [phone].  torsemide (DEMADEX) 20 MG   pravastatin (PRAVACHOL) 80 MG tablet   Wellbutrin  150 mg  Levothyroxine 50 mcg  Metoprolol tartrate 50 mg  Ondansetron 4 mg  Isosorbide mononitrate 50 mg  Protonix 40 mg  Plavix 75 mg   [facility]  Xanax 0.5 mg  Tramadol 50 mg  Rocaltrol 0.25 mcg  Ambien 10 mg  Amlodipine 2.5 mg  Diphenoxylate-atropine 2.5-0.025 mg

## 2021-05-11 ENCOUNTER — Telehealth: Payer: Self-pay | Admitting: Cardiology

## 2021-05-11 NOTE — Telephone Encounter (Signed)
Error

## 2021-05-11 NOTE — Telephone Encounter (Signed)
Called and spoke to [place], She said make sure update patient [facility] [place] for future reference.

## 2021-05-11 NOTE — Telephone Encounter (Signed)
This has been updated in pt chart 

## 2021-05-13 ENCOUNTER — Other Ambulatory Visit: Payer: Self-pay | Admitting: Internal Medicine

## 2021-05-17 ENCOUNTER — Encounter: Payer: Self-pay | Admitting: Internal Medicine

## 2021-05-17 ENCOUNTER — Ambulatory Visit (INDEPENDENT_AMBULATORY_CARE_PROVIDER_SITE_OTHER): Payer: Medicare Other | Admitting: Internal Medicine

## 2021-05-17 ENCOUNTER — Other Ambulatory Visit: Payer: Self-pay

## 2021-05-17 VITALS — BP 112/72 | HR 52 | Resp 18 | Ht 62.0 in | Wt 223.1 lb

## 2021-05-17 DIAGNOSIS — I25118 Atherosclerotic heart disease of native coronary artery with other forms of angina pectoris: Secondary | ICD-10-CM | POA: Diagnosis not present

## 2021-05-17 DIAGNOSIS — I739 Peripheral vascular disease, unspecified: Secondary | ICD-10-CM

## 2021-05-17 DIAGNOSIS — F411 Generalized anxiety disorder: Secondary | ICD-10-CM

## 2021-05-17 DIAGNOSIS — I10 Essential (primary) hypertension: Secondary | ICD-10-CM

## 2021-05-17 DIAGNOSIS — N1831 Chronic kidney disease, stage 3a: Secondary | ICD-10-CM | POA: Diagnosis not present

## 2021-05-17 DIAGNOSIS — F331 Major depressive disorder, recurrent, moderate: Secondary | ICD-10-CM

## 2021-05-17 DIAGNOSIS — E1169 Type 2 diabetes mellitus with other specified complication: Secondary | ICD-10-CM

## 2021-05-17 DIAGNOSIS — J9611 Chronic respiratory failure with hypoxia: Secondary | ICD-10-CM | POA: Insufficient documentation

## 2021-05-17 MED ORDER — SERTRALINE HCL 50 MG PO TABS: 50.0000 mg | ORAL_TABLET | Freq: Every day | ORAL | 3 refills | Status: DC

## 2021-05-17 MED ORDER — BLOOD GLUCOSE METER KIT: PACK | 0 refills | Status: DC

## 2021-05-17 NOTE — Assessment & Plan Note (Signed)
BP Readings from Last 1 Encounters:  05/17/21 112/72   Overall well-controlled considering her age 76 for compliance with the medications Advised DASH diet

## 2021-05-17 NOTE — Patient Instructions (Signed)
Please start taking Zoloft for depression and anxiety.  Please continue taking other medications as prescribed for now.  Please follow low salt diet and ambulate as tolerated.

## 2021-05-17 NOTE — Assessment & Plan Note (Signed)
°  Lab Results  Component Value Date   HGBA1C 6.3 (H) 03/10/2021    Diet controlled Avoid tighter control in her case due to risk of hypoglycemia related fall

## 2021-05-17 NOTE — Progress Notes (Signed)
° °Established Patient Office Visit ° °Subjective:  °Patient ID: [name], female    DOB: 06/11/1945  Age: 75 y.o. MRN: [id number] ° °CC:  °Chief Complaint  °Patient presents with  ° Follow-up  °  Follow up pt is not sleeping at night xanax is not working for her nerves family feels like she needs something stronger   ° ° °HPI °[name] is a 75 y.o. female with past medical history of hypertension, hyperlipidemia, mesenteric ischemia status post right hemicolectomy, coronary artery disease, hypothyroidism, type 2 diabetes mellitus, depression with anxiety, obesity, arthritis, chronic back pain who presents for f/u of her chronic medical conditions. Her daughter is present during the visit. ° °HTN: BP is well-controlled now.  She has started taking amlodipine recently.  Her home BP readings are better controlled now, around 120s to 140s/70s-80s.  She had cardiac cath on December 14, which showed severe multivessel stenosis, but there were no reasonable options for PCI and was continued on medical therapy for now.  She currently denies any chest pain or palpitations. ° °COPD: She has been using Flovent and as needed albuterol.  She uses home O2 at nighttime for dyspnea and hypoxia (O2 sat less than 88%).  She requests a new prescription for home O2 to be sent to [facility].  She denies any fever, chills or wheezing currently. ° °MDD with anxiety: She currently takes Ambien for insomnia, but still complains of insomnia and anxiety during the daytime.  She also reports anhedonia.  She did not like BuSpar and has been taking Xanax for anxiety.  She lives alone currently.  Her daughter helps her with transportation.  She denies any SI or HI currently. ° °Past Medical History:  °Diagnosis Date  ° Acute ischemic colitis (HCC) 09/11/2005  ° Anxiety   ° Arthritis   ° Atherosclerotic vascular disease   ° Calcified plaque at the origin of the celiac and SMA  ° CHF (congestive heart failure) (HCC)   °  Coronary atherosclerosis of native coronary artery   ° Mulitvessel LVEF 50-50%, DES circ 1/11, occluded SVG to OM and SVG to diagonal , LVEF 60%  ° COVID-19 virus infection 11/07/2020  ° Depression   ° Diverticulosis of colon   ° Essential hypertension   ° Hypothyroidism   ° Mesenteric ischemia (HCC)   ° Mitral regurgitation   ° Mild to moderate  ° Mixed hyperlipidemia   ° Myocardial infarction (HCC) 1990  ° Obesity   ° Orthostatic hypotension   ° PAD (peripheral artery disease) (HCC)   ° PVC's (premature ventricular contractions)   ° Schizophrenia (HCC)   ° Sleep apnea   ° CPAP  ° TIA (transient ischemic attack) 11/20/2017  ° Tubular adenoma   ° Type 2 diabetes mellitus (HCC)   ° Vascular complications of mesenteric artery   ° ° °Past Surgical History:  °Procedure Laterality Date  ° BACK SURGERY    ° Lumbar spine surgery  ° Carotid endarectomy Bilateral   ° CARPAL TUNNEL RELEASE    ° Bilateral  ° CATARACT EXTRACTION W/PHACO Right 08/24/2013  ° Procedure: CATARACT EXTRACTION PHACO AND INTRAOCULAR LENS PLACEMENT (IOC);  Surgeon: Kerry Hunt, MD;  Location: [facility];  Service: Ophthalmology;  Laterality: Right;  CDE 8.68  ° COLONOSCOPY  09/14/2005  ° Dr. Gessner-ischemic colitis  ° COLONOSCOPY WITH ESOPHAGOGASTRODUODENOSCOPY (EGD)  04/14/2012  ° Dr. Rourk- EGD= gastric erosions of doubtful clinical significance per bx- chronic inactive gastritis, benign small bowel mucosa. TCS=colonic diverticulosis,   tubular adenoma  ° CORONARY ARTERY BYPASS GRAFT    ° LIMA-LAD; SVG-OM; SVG-DX in 1995 [facility]  ° NECK SURGERY    ° Cervical laminectomy  ° PARTIAL HYSTERECTOMY    ° RIGHT/LEFT HEART CATH AND CORONARY/GRAFT ANGIOGRAPHY N/A 04/12/2021  ° Procedure: RIGHT/LEFT HEART CATH AND CORONARY/GRAFT ANGIOGRAPHY;  Surgeon: Arida, Muhammad A, MD;  Location: [facility];  Service: Cardiovascular;  Laterality: N/A;  ° stents x4    ° ° °Family History  °Problem Relation Age of Onset  ° Alcohol abuse Mother   ° Alcohol abuse Father   °  Coronary artery disease Other   ° Hypertension Other   ° Crohn's disease Sister 60  ° Stroke Daughter   ° ° °Social History  ° °Socioeconomic History  ° Marital status: Widowed  °  Spouse name: Not on file  ° Number of children: 4  ° Years of education: Not on file  ° Highest education level: Not on file  °Occupational History  ° Occupation: DISABLED  °  Employer: UNEMPLOYED  °Tobacco Use  ° Smoking status: Light Smoker  °  Packs/day: 0.50  °  Years: 50.00  °  Pack years: 25.00  °  Types: Cigarettes  °  Start date: 12/30/2018  ° Smokeless tobacco: Never  °Vaping Use  ° Vaping Use: Never used  °Substance and Sexual Activity  ° Alcohol use: Yes  °  Alcohol/week: 0.0 standard drinks  °  Comment: occasionally   ° Drug use: No  ° Sexual activity: Not on file  °Other Topics Concern  ° Not on file  °Social History Narrative  ° LIves alone  ° °Social Determinants of Health  ° °Financial Resource Strain: Low Risk   ° Difficulty of Paying Living Expenses: Not hard at all  °Food Insecurity: No Food Insecurity  ° Worried About Running Out of Food in the Last Year: Never true  ° Ran Out of Food in the Last Year: Never true  °Transportation Needs: No Transportation Needs  ° Lack of Transportation (Medical): No  ° Lack of Transportation (Non-Medical): No  °Physical Activity: Inactive  ° Days of Exercise per Week: 0 days  ° Minutes of Exercise per Session: 0 min  °Stress: No Stress Concern Present  ° Feeling of Stress : Only a little  °Social Connections: Socially Isolated  ° Frequency of Communication with Friends and Family: More than three times a week  ° Frequency of Social Gatherings with Friends and Family: Never  ° Attends Religious Services: Never  ° Active Member of Clubs or Organizations: No  ° Attends Club or Organization Meetings: Never  ° Marital Status: Widowed  °Intimate Partner Violence: Not At Risk  ° Fear of Current or Ex-Partner: No  ° Emotionally Abused: No  ° Physically Abused: No  ° Sexually Abused: No   ° ° °Outpatient Medications Prior to Visit  °Medication Sig Dispense Refill  ° albuterol (VENTOLIN HFA) 108 (90 Base) MCG/ACT inhaler USE 2 INHALATIONS BY MOUTH  EVERY 6 HOURS AS NEEDED FOR WHEEZING OR SHORTNESS OF  BREATH (Patient taking differently: 1 puff every 6 (six) hours as needed for shortness of breath or wheezing.) 18 g 0  ° ALPRAZolam (XANAX) 0.5 MG tablet Take 1 tablet (0.5 mg total) by mouth 2 (two) times daily as needed for anxiety. for anxiety 60 tablet 2  ° amLODipine (NORVASC) 2.5 MG tablet TAKE ONE (1) TABLET BY MOUTH EVERY DAY 30 tablet 0  ° aspirin EC 81 MG tablet   tablet Take 81 mg by mouth at bedtime.     Biotin 10000 MCG TABS Take 1 tablet by mouth daily.     calcitRIOL (ROCALTROL) 0.25 MCG capsule Take 0.25 mcg by mouth 2 (two) times a week.     Cholecalciferol (VITAMIN D3) 50 MCG (2000 UT) TABS Take 1 tablet by mouth daily.     clopidogrel (PLAVIX) 75 MG tablet TAKE 1 TABLET BY MOUTH  DAILY 90 tablet 3   Collagen-Vitamin C (COLLAGEN PLUS VITAMIN C PO) Take 1 tablet by mouth daily.     diphenoxylate-atropine (LOMOTIL) 2.5-0.025 MG tablet Take one tablet by mouth twice daily ,a s needed, for loose stool 15 tablet 0   docusate sodium (COLACE) 100 MG capsule Take 100 mg by mouth as needed for mild constipation.     FIBER COMPLETE PO Take 2 tablets by mouth daily. Fiber Well gummies     fluticasone (FLOVENT HFA) 110 MCG/ACT inhaler Inhale 1 puff into the lungs in the morning and at bedtime. (Patient taking differently: Inhale 2 puffs into the lungs in the morning and at bedtime.) 1 each 12   isosorbide mononitrate (IMDUR) 60 MG 24 hr tablet Take 1 tablet (60 mg total) by mouth daily. 90 tablet 2   levothyroxine (SYNTHROID) 50 MCG tablet Take 1 tablet (50 mcg total) by mouth daily before breakfast. 30 tablet 11   Melatonin 10 MG TABS Take 10 mg by mouth at bedtime as needed (sleep).     metoprolol tartrate (LOPRESSOR) 50 MG tablet Take 1 tablet (50 mg total) by mouth 2 (two) times daily. 90  tablet 0   nitroGLYCERIN (NITROSTAT) 0.4 MG SL tablet Place 1 tablet (0.4 mg total) under the tongue every 5 (five) minutes as needed. (Patient taking differently: Place 0.4 mg under the tongue every 5 (five) minutes as needed for chest pain.) 90 tablet 3   ondansetron (ZOFRAN ODT) 4 MG disintegrating tablet Take 1 tablet (4 mg total) by mouth every 8 (eight) hours as needed for nausea or vomiting. 20 tablet 0   oxyCODONE-acetaminophen (PERCOCET/ROXICET) 5-325 MG tablet Take 1 tablet by mouth every 6 (six) hours as needed for severe pain. 15 tablet 0   pantoprazole (PROTONIX) 40 MG tablet Take 1 tablet (40 mg total) by mouth in the morning. 90 tablet 0   Potassium 99 MG TABS Take 99 mg by mouth daily.     pravastatin (PRAVACHOL) 80 MG tablet Take 1 tablet (80 mg total) by mouth daily. 90 tablet 3   torsemide (DEMADEX) 20 MG tablet Take 2 tablets (40 mg total) by mouth daily. (Patient taking differently: Take 40 mg by mouth daily. Takes occasionally - 04/17/2021) 180 tablet 2   traMADol (ULTRAM) 50 MG tablet Take 50 mg by mouth as needed.     zolpidem (AMBIEN) 10 MG tablet Take 1 tablet (10 mg total) by mouth at bedtime as needed for sleep. 30 tablet 2   Facility-Administered Medications Prior to Visit  Medication Dose Route Frequency Provider Last Rate Last Admin   sodium chloride flush (NS) 0.9 % injection 3 mL  3 mL Intravenous Q12H Satira Sark, MD        No Known Allergies  ROS Review of Systems  Constitutional:  Positive for fatigue. Negative for chills and fever.  HENT:  Negative for congestion, sinus pressure, sinus pain and sore throat.   Eyes:  Negative for pain and discharge.  Respiratory:  Positive for shortness of breath. Negative for cough.   Cardiovascular:  Negative for chest pain and palpitations.  Gastrointestinal:  Negative for abdominal pain, diarrhea, nausea and vomiting.  Endocrine: Negative for polydipsia and polyuria.  Genitourinary:  Negative for dysuria and  hematuria.  Musculoskeletal:  Positive for arthralgias and neck pain. Negative for neck stiffness.  Skin:  Negative for rash.  Neurological:  Negative for dizziness and weakness.  Psychiatric/Behavioral:  Positive for sleep disturbance. Negative for agitation and behavioral problems. The patient is nervous/anxious.      Objective:    Physical Exam Vitals reviewed.  Constitutional:      General: She is not in acute distress.    Appearance: She is obese. She is not diaphoretic.  HENT:     Head: Normocephalic and atraumatic.     Nose: Nose normal. No congestion.     Mouth/Throat:     Mouth: Mucous membranes are moist.     Pharynx: No posterior oropharyngeal erythema.  Eyes:     General: No scleral icterus.    Extraocular Movements: Extraocular movements intact.  Cardiovascular:     Rate and Rhythm: Normal rate and regular rhythm.     Pulses: Normal pulses.     Heart sounds: Normal heart sounds. No murmur heard. Pulmonary:     Breath sounds: Normal breath sounds. No wheezing or rales.  Abdominal:     Palpations: Abdomen is soft.     Tenderness: There is no abdominal tenderness.  Musculoskeletal:     Cervical back: Normal range of motion and neck supple. No rigidity or tenderness.     Right lower leg: Edema (1+) present.     Left lower leg: Edema (1+) present.  Skin:    General: Skin is warm.     Findings: No rash.  Neurological:     General: No focal deficit present.     Mental Status: She is alert and oriented to person, place, and time.     Sensory: No sensory deficit.     Motor: No weakness.     Gait: Gait abnormal (Likely in the setting of baseline hip pain).  Psychiatric:        Mood and Affect: Mood normal.        Behavior: Behavior normal.    BP 112/72 (BP Location: Left Arm, Patient Position: Sitting, Cuff Size: Normal)    Pulse (!) 52    Resp 18    Ht 5' 2" (1.575 m)    Wt 223 lb 1.9 oz (101.2 kg)    SpO2 95%    BMI 40.81 kg/m  Wt Readings from Last 3  Encounters:  05/17/21 223 lb 1.9 oz (101.2 kg)  04/17/21 233 lb 9.6 oz (106 kg)  04/12/21 230 lb (104.3 kg)    Lab Results  Component Value Date   TSH 1.140 08/03/2020   Lab Results  Component Value Date   WBC 5.8 03/13/2021   HGB 10.9 (L) 04/12/2021   HCT 32.0 (L) 04/12/2021   MCV 95.8 03/13/2021   PLT 171 03/13/2021   Lab Results  Component Value Date   NA 145 04/12/2021   K 3.7 04/12/2021   CO2 23 03/13/2021   GLUCOSE 102 (H) 03/13/2021   BUN 15 03/13/2021   CREATININE 1.03 (H) 03/13/2021   BILITOT 0.7 03/13/2021   ALKPHOS 103 03/13/2021   AST 16 03/13/2021   ALT 10 03/13/2021   PROT 7.0 03/13/2021   ALBUMIN 3.6 03/13/2021   CALCIUM 8.4 (L) 03/13/2021   ANIONGAP 9 03/13/2021   EGFR 50 (L) 03/10/2021  Lab Results  °Component Value Date  ° CHOL 142 08/19/2017  ° °Lab Results  °Component Value Date  ° HDL 45 08/19/2017  ° °Lab Results  °Component Value Date  ° LDLCALC 66 08/19/2017  ° °Lab Results  °Component Value Date  ° TRIG 154 (H) 08/19/2017  ° °Lab Results  °Component Value Date  ° CHOLHDL 3.2 08/19/2017  ° °Lab Results  °Component Value Date  ° HGBA1C 6.3 (H) 03/10/2021  ° ° °  °Assessment & Plan:  ° °Problem List Items Addressed This Visit   ° °  ° Cardiovascular and Mediastinum  ° Benign essential hypertension  °  BP Readings from Last 1 Encounters:  °05/17/21 112/72  °Overall well-controlled considering her age °Counseled for compliance with the medications °Advised DASH diet °  °  ° CAD, NATIVE VESSEL  °  S/p CABG °Had cardiac cath in 12/22 - multivessel CAD, but not suitable for stenting °On DAPT, Metoprolol and Indur °No active angina symptoms °Follows up with Cardiology - Dr McDowell °  °  ° PAD (peripheral artery disease) (HCC)  °  On DAPT and statin °  °  °  ° Respiratory  ° Chronic respiratory failure with hypoxia (HCC)  °  Due to underlying COPD °Uses Flovent and as needed albuterol °Uses O2 at nighttime, O2 sat drops below 88% at nighttime, new Rx sent to  [facility] °  °  ° Relevant Orders  ° For home use only DME oxygen  °  ° Endocrine  ° DM2 (diabetes mellitus, type 2) (HCC) - Primary  °   °Lab Results  °Component Value Date  ° HGBA1C 6.3 (H) 03/10/2021  ° ° °Diet controlled °Avoid tighter control in her case due to risk of hypoglycemia related fall °  °  ° Relevant Orders  ° Microalbumin, urine  °  ° Genitourinary  ° Stage 3 chronic kidney disease (HCC)  °  Likely due to HTN and DM °On Torsemide for HFrEF °Avoid nephrotoxic agents including NSAIDs °Followed by Dr. Bhutani °  °  °  ° Other  ° MDD (major depressive disorder), recurrent episode, moderate (HCC)  °  Flowsheet Row Office Visit from 05/17/2021 in [facility]  °PHQ-9 Total Score 9  °Started Zoloft 50 mg daily °Takes Ambien as needed for insomnia °  °  ° Relevant Medications  ° sertraline (ZOLOFT) 50 MG tablet  ° GAD (generalized anxiety disorder)  °  Takes Xanax, sometimes twice daily - still has anhedonia and spells of anxiety °Added Zoloft 50 mg QD for MDD and anxiety °Patient also takes Ambien for insomnia. °She lives alone and could be a reason for her anxiety as well °  °  ° Relevant Medications  ° sertraline (ZOLOFT) 50 MG tablet  ° ° °Meds ordered this encounter  °Medications  ° sertraline (ZOLOFT) 50 MG tablet  °  Sig: Take 1 tablet (50 mg total) by mouth daily.  °  Dispense:  30 tablet  °  Refill:  3  ° blood glucose meter kit and supplies  °  Sig: Dispense based on patient and insurance preference. Use up to four times daily as directed. (FOR ICD-10 E10.9, E11.9).  °  Dispense:  1 each  °  Refill:  0  °  Order Specific Question:   Number of strips  °  Answer:   100  °  Order Specific Question:   Number of lancets  °  Answer:   100  ° ° °  Follow-up: Return in about 4 months (around 09/14/2021) for Annual physical.  ° ° ° K , MD °

## 2021-05-17 NOTE — Assessment & Plan Note (Signed)
S/p CABG Had cardiac cath in 12/22 - multivessel CAD, but not suitable for stenting On DAPT, Metoprolol and Indur No active angina symptoms Follows up with Cardiology - Dr Domenic Polite

## 2021-05-17 NOTE — Assessment & Plan Note (Signed)
On DAPT and statin

## 2021-05-17 NOTE — Assessment & Plan Note (Addendum)
Due to underlying COPD Uses Flovent and as needed albuterol Uses O2 at nighttime, O2 sat drops below 88% at nighttime, new Rx sent to [facility]

## 2021-05-17 NOTE — Assessment & Plan Note (Signed)
Likely due to HTN and DM On Torsemide for HFrEF Avoid nephrotoxic agents including NSAIDs Followed by Dr. Theador Hawthorne

## 2021-05-17 NOTE — Assessment & Plan Note (Signed)
Takes Xanax, sometimes twice daily - still has anhedonia and spells of anxiety Added Zoloft 50 mg QD for MDD and anxiety Patient also takes Ambien for insomnia. She lives alone and could be a reason for her anxiety as well

## 2021-05-17 NOTE — Assessment & Plan Note (Signed)
[place] Office Visit from 05/17/2021 in [facility]  PHQ-9 Total Score 9     Started Zoloft 50 mg daily Takes Ambien as needed for insomnia

## 2021-05-18 ENCOUNTER — Telehealth: Payer: Self-pay | Admitting: Internal Medicine

## 2021-05-18 NOTE — Telephone Encounter (Signed)
Brandy with [employer] in on pt behalf in regard to oxygen order received .  Has a few questions wants a return call   [facility]  [phone]

## 2021-05-18 NOTE — Telephone Encounter (Signed)
Spoke with brandy [employer]

## 2021-05-19 LAB — MICROALBUMIN, URINE: Microalbumin, Urine: 34.8 ug/mL

## 2021-05-24 DIAGNOSIS — I129 Hypertensive chronic kidney disease with stage 1 through stage 4 chronic kidney disease, or unspecified chronic kidney disease: Secondary | ICD-10-CM | POA: Diagnosis not present

## 2021-05-24 DIAGNOSIS — E1122 Type 2 diabetes mellitus with diabetic chronic kidney disease: Secondary | ICD-10-CM | POA: Diagnosis not present

## 2021-05-24 DIAGNOSIS — E559 Vitamin D deficiency, unspecified: Secondary | ICD-10-CM | POA: Diagnosis not present

## 2021-05-24 DIAGNOSIS — E211 Secondary hyperparathyroidism, not elsewhere classified: Secondary | ICD-10-CM | POA: Diagnosis not present

## 2021-05-24 DIAGNOSIS — N189 Chronic kidney disease, unspecified: Secondary | ICD-10-CM | POA: Diagnosis not present

## 2021-05-30 DIAGNOSIS — E211 Secondary hyperparathyroidism, not elsewhere classified: Secondary | ICD-10-CM | POA: Diagnosis not present

## 2021-05-30 DIAGNOSIS — N189 Chronic kidney disease, unspecified: Secondary | ICD-10-CM | POA: Diagnosis not present

## 2021-05-30 DIAGNOSIS — E559 Vitamin D deficiency, unspecified: Secondary | ICD-10-CM | POA: Diagnosis not present

## 2021-05-30 DIAGNOSIS — I129 Hypertensive chronic kidney disease with stage 1 through stage 4 chronic kidney disease, or unspecified chronic kidney disease: Secondary | ICD-10-CM | POA: Diagnosis not present

## 2021-05-30 DIAGNOSIS — E1122 Type 2 diabetes mellitus with diabetic chronic kidney disease: Secondary | ICD-10-CM | POA: Diagnosis not present

## 2021-06-02 ENCOUNTER — Telehealth: Payer: Self-pay | Admitting: Cardiology

## 2021-06-02 NOTE — Telephone Encounter (Signed)
° °  Pt c/o medication issue:  1. Name of Medication: valsartan 20 mg  2. How are you currently taking this medication (dosage and times per day)? 1 tablet a day  3. Are you having a reaction (difficulty breathing--STAT)?   4. What is your medication issue? Pt's granddaughter  said, pt's kidney doctor prescribed this meds, she wanted to ask Dr. Domenic Polite if pt can take this meds with the heart condition she have

## 2021-06-02 NOTE — Telephone Encounter (Signed)
Voice mail is full  

## 2021-06-06 NOTE — Telephone Encounter (Signed)
Left message to return call 

## 2021-06-09 ENCOUNTER — Other Ambulatory Visit: Payer: Self-pay | Admitting: Cardiology

## 2021-06-09 MED ORDER — VALSARTAN 40 MG PO TABS: 20.0000 mg | ORAL_TABLET | Freq: Every day | ORAL | Status: DC

## 2021-06-09 NOTE — Addendum Note (Signed)
Addended by: Laurine Blazer on: 06/09/2021 10:30 AM   Modules accepted: Orders

## 2021-06-09 NOTE — Telephone Encounter (Signed)
Notified grand-daughter Joelene Millin Amburn).

## 2021-06-13 ENCOUNTER — Ambulatory Visit: Payer: Medicare Other | Admitting: Internal Medicine

## 2021-06-13 DIAGNOSIS — E559 Vitamin D deficiency, unspecified: Secondary | ICD-10-CM | POA: Diagnosis not present

## 2021-06-13 DIAGNOSIS — N189 Chronic kidney disease, unspecified: Secondary | ICD-10-CM | POA: Diagnosis not present

## 2021-06-13 DIAGNOSIS — E211 Secondary hyperparathyroidism, not elsewhere classified: Secondary | ICD-10-CM | POA: Diagnosis not present

## 2021-06-13 DIAGNOSIS — I129 Hypertensive chronic kidney disease with stage 1 through stage 4 chronic kidney disease, or unspecified chronic kidney disease: Secondary | ICD-10-CM | POA: Diagnosis not present

## 2021-06-13 DIAGNOSIS — E1122 Type 2 diabetes mellitus with diabetic chronic kidney disease: Secondary | ICD-10-CM | POA: Diagnosis not present

## 2021-06-14 ENCOUNTER — Encounter: Payer: Self-pay | Admitting: Cardiology

## 2021-06-14 ENCOUNTER — Ambulatory Visit: Payer: Medicare Other | Admitting: Cardiology

## 2021-06-14 VITALS — BP 128/68 | HR 51 | Ht 63.0 in | Wt 220.6 lb

## 2021-06-14 DIAGNOSIS — I10 Essential (primary) hypertension: Secondary | ICD-10-CM | POA: Diagnosis not present

## 2021-06-14 DIAGNOSIS — I25119 Atherosclerotic heart disease of native coronary artery with unspecified angina pectoris: Secondary | ICD-10-CM | POA: Diagnosis not present

## 2021-06-14 NOTE — Patient Instructions (Signed)

## 2021-06-14 NOTE — Progress Notes (Signed)
Cardiology Office Note  Date: 06/14/2021   ID: Misty, [name] [birth year], MRN [id number]  PCP:  Lindell Spar, MD  Cardiologist:  Rozann Lesches, MD Electrophysiologist:  None   Chief Complaint  Patient presents with   Cardiac follow-up    History of Present Illness: [name] is a 76 y.o. female last seen in December 2022.  She is here for a follow-up visit.  Reports no progressive angina symptoms, stable dyspnea on exertion.  I reviewed her home blood pressure checks today.  She has tolerated addition of Norvasc, also recently placed on Diovan with continued follow-up by Dr. Theador Hawthorne.  Today's blood pressure is well controlled.  Past Medical History:  Diagnosis Date   Acute ischemic colitis ([place]) 09/11/2005   Anxiety    Arthritis    Atherosclerotic vascular disease    Calcified plaque at the origin of the celiac and SMA   CHF (congestive heart failure) (HCC)    Coronary atherosclerosis of native coronary artery    Mulitvessel LVEF 50-50%, DES circ 1/11, occluded SVG to OM and SVG to diagonal , LVEF 60%   COVID-19 virus infection 11/07/2020   Depression    Diverticulosis of colon    Essential hypertension    Hypothyroidism    Mesenteric ischemia (HCC)    Mitral regurgitation    Mild to moderate   Mixed hyperlipidemia    Myocardial infarction ([place]) 1990   Obesity    Orthostatic hypotension    PAD (peripheral artery disease) (HCC)    PVC's (premature ventricular contractions)    Schizophrenia (HCC)    Sleep apnea    CPAP   TIA (transient ischemic attack) 11/20/2017   Tubular adenoma    Type 2 diabetes mellitus (HCC)    Vascular complications of mesenteric artery     Past Surgical History:  Procedure Laterality Date   BACK SURGERY     Lumbar spine surgery   Carotid endarectomy Bilateral    CARPAL TUNNEL RELEASE     Bilateral   CATARACT EXTRACTION W/PHACO Right 08/24/2013   Procedure: CATARACT EXTRACTION PHACO AND INTRAOCULAR LENS  PLACEMENT ([place]);  Surgeon: Tonny Branch, MD;  Location: [facility];  Service: Ophthalmology;  Laterality: Right;  CDE 8.68   COLONOSCOPY  09/14/2005   Dr. Leonard Schwartz colitis   COLONOSCOPY WITH ESOPHAGOGASTRODUODENOSCOPY (EGD)  04/14/2012   Dr. Gala Romney- EGD= gastric erosions of doubtful clinical significance per bx- chronic inactive gastritis, benign small bowel mucosa. TCS=colonic diverticulosis, tubular adenoma   CORONARY ARTERY BYPASS GRAFT     LIMA-LAD; SVG-OM; SVG-DX in [place]     Cervical laminectomy   PARTIAL HYSTERECTOMY     RIGHT/LEFT HEART CATH AND CORONARY/GRAFT ANGIOGRAPHY N/A 04/12/2021   Procedure: RIGHT/LEFT HEART CATH AND CORONARY/GRAFT ANGIOGRAPHY;  Surgeon: Wellington Hampshire, MD;  Location: [facility];  Service: Cardiovascular;  Laterality: N/A;   stents x4      Current Outpatient Medications  Medication Sig Dispense Refill   albuterol (VENTOLIN HFA) 108 (90 Base) MCG/ACT inhaler USE 2 INHALATIONS BY MOUTH  EVERY 6 HOURS AS NEEDED FOR WHEEZING OR SHORTNESS OF  BREATH (Patient taking differently: 1 puff every 6 (six) hours as needed for shortness of breath or wheezing.) 18 g 0   ALPRAZolam (XANAX) 0.5 MG tablet Take 1 tablet (0.5 mg total) by mouth 2 (two) times daily as needed for anxiety. for anxiety 60 tablet 2   amLODipine (NORVASC) 2.5 MG tablet TAKE ONE (1) TABLET  BY MOUTH EVERY DAY 90 tablet 1   aspirin EC 81 MG tablet Take 81 mg by mouth at bedtime.     Biotin 10000 MCG TABS Take 1 tablet by mouth daily.     blood glucose meter kit and supplies Dispense based on patient and insurance preference. Use up to four times daily as directed. (FOR ICD-10 E10.9, E11.9). 1 each 0   calcitRIOL (ROCALTROL) 0.25 MCG capsule Take 0.25 mcg by mouth 2 (two) times a week.     Cholecalciferol (VITAMIN D3) 50 MCG (2000 UT) TABS Take 1 tablet by mouth daily.     clopidogrel (PLAVIX) 75 MG tablet TAKE 1 TABLET BY MOUTH  DAILY 90 tablet 3   Collagen-Vitamin C  (COLLAGEN PLUS VITAMIN C PO) Take 1 tablet by mouth daily.     diphenoxylate-atropine (LOMOTIL) 2.5-0.025 MG tablet Take one tablet by mouth twice daily ,a s needed, for loose stool 15 tablet 0   docusate sodium (COLACE) 100 MG capsule Take 100 mg by mouth as needed for mild constipation.     FIBER COMPLETE PO Take 2 tablets by mouth daily. Fiber Well gummies     fluticasone (FLOVENT HFA) 110 MCG/ACT inhaler Inhale 1 puff into the lungs in the morning and at bedtime. (Patient taking differently: Inhale 2 puffs into the lungs in the morning and at bedtime.) 1 each 12   isosorbide mononitrate (IMDUR) 60 MG 24 hr tablet Take 1 tablet (60 mg total) by mouth daily. 90 tablet 2   levothyroxine (SYNTHROID) 50 MCG tablet Take 1 tablet (50 mcg total) by mouth daily before breakfast. 30 tablet 11   Melatonin 10 MG TABS Take 10 mg by mouth at bedtime as needed (sleep).     metoprolol tartrate (LOPRESSOR) 50 MG tablet Take 1 tablet (50 mg total) by mouth 2 (two) times daily. 90 tablet 0   nitroGLYCERIN (NITROSTAT) 0.4 MG SL tablet Place 1 tablet (0.4 mg total) under the tongue every 5 (five) minutes as needed. (Patient taking differently: Place 0.4 mg under the tongue every 5 (five) minutes as needed for chest pain.) 90 tablet 3   ondansetron (ZOFRAN ODT) 4 MG disintegrating tablet Take 1 tablet (4 mg total) by mouth every 8 (eight) hours as needed for nausea or vomiting. 20 tablet 0   oxyCODONE-acetaminophen (PERCOCET/ROXICET) 5-325 MG tablet Take 1 tablet by mouth every 6 (six) hours as needed for severe pain. 15 tablet 0   pantoprazole (PROTONIX) 40 MG tablet Take 1 tablet (40 mg total) by mouth in the morning. 90 tablet 0   Potassium 99 MG TABS Take 99 mg by mouth daily.     pravastatin (PRAVACHOL) 80 MG tablet Take 1 tablet (80 mg total) by mouth daily. 90 tablet 3   sertraline (ZOLOFT) 50 MG tablet Take 1 tablet (50 mg total) by mouth daily. 30 tablet 3   torsemide (DEMADEX) 20 MG tablet Take 2 tablets (40  mg total) by mouth daily. (Patient taking differently: Take 40 mg by mouth daily. Takes occasionally - 04/17/2021) 180 tablet 2   traMADol (ULTRAM) 50 MG tablet Take 50 mg by mouth as needed.     valsartan (DIOVAN) 40 MG tablet Take 40 mg by mouth daily.     zolpidem (AMBIEN) 10 MG tablet Take 1 tablet (10 mg total) by mouth at bedtime as needed for sleep. 30 tablet 2   Current Facility-Administered Medications  Medication Dose Route Frequency Provider Last Rate Last Admin   sodium chloride flush (NS)  0.9 % injection 3 mL  3 mL Intravenous Q12H Satira Sark, MD       Allergies:  Patient has no known allergies.   ROS: No palpitations or syncope.  Physical Exam: VS:  BP 128/68    Pulse (!) 51    Ht _0  (1.6 m)    Wt 220 lb 9.6 oz (100.1 kg)    SpO2 94%    BMI 39.08 kg/m , BMI Body mass index is 39.08 kg/m.  Wt Readings from Last 3 Encounters:  06/14/21 220 lb 9.6 oz (100.1 kg)  05/17/21 223 lb 1.9 oz (101.2 kg)  04/17/21 233 lb 9.6 oz (106 kg)    General: Patient appears comfortable at rest. HEENT: Conjunctiva and lids normal, wearing a mask. Neck: Supple, no elevated JVP or carotid bruits, no thyromegaly. Lungs: Clear to auscultation, nonlabored breathing at rest. Cardiac: Regular rate and rhythm, no S3, 1/6 systolic murmur, no pericardial rub. Extremities: No pitting edema.  ECG:  An ECG dated 03/14/2021 was personally reviewed today and demonstrated:  Sinus rhythm with PAC and old anterior infarct pattern.  Recent Labwork: 08/03/2020: TSH 1.140 03/13/2021: ALT 10; AST 16; BUN 15; Creatinine, Ser 1.03; Platelets 171 04/12/2021: Hemoglobin 10.9; Potassium 3.7; Sodium 145   Other Studies Reviewed Today:  Echocardiogram 06/14/2020:  1. Abnormal septal motion distal septal apical mid and basal inferior  wall hypokinesis . Left ventricular ejection fraction, by estimation, is  45 to 50%. The left ventricle has mildly decreased function. The left  ventricle has no regional  wall motion  abnormalities. The left ventricular internal cavity size was mildly  dilated. There is moderate left ventricular hypertrophy. Left ventricular  diastolic parameters are consistent with Grade I diastolic dysfunction  (impaired relaxation).   2. Right ventricular systolic function is normal. The right ventricular  size is normal.   3. Left atrial size was mildly dilated.   4. The mitral valve is degenerative. Mild mitral valve regurgitation. No  evidence of mitral stenosis.   5. The aortic valve is tricuspid. Aortic valve regurgitation is not  visualized. Mild aortic valve sclerosis is present, with no evidence of  aortic valve stenosis.   6. The inferior vena cava is normal in size with greater than 50%  respiratory variability, suggesting right atrial pressure of 3 mmHg.    Cardiac catheterization 04/12/2021:   Ost LM to Mid LM lesion is 40% stenosed.   1st Mrg lesion is 50% stenosed.   Prox Cx lesion is 50% stenosed.   Ost LAD to Prox LAD lesion is 90% stenosed.   Prox Cx to Mid Cx lesion is 99% stenosed.   Prox RCA to Mid RCA lesion is 100% stenosed.   Prox RCA lesion is 80% stenosed.   Origin lesion is 100% stenosed.   Origin to Prox Graft lesion is 100% stenosed.   LIMA graft was visualized by angiography and is normal in caliber.   1.  Severe underlying three-vessel coronary artery disease with patent LIMA to LAD.  Chronically occluded SVG to OM and SVG to diagonal.  The SVG to right PDA which was patent on most recent angiography in 2011 is now occluded.  Left circumflex stent from the proximal portion extending into OM1 is patent with moderate in-stent restenosis and subtotal occlusion of the AV groove left circumflex which was present before. 2.  Right heart catheterization showed high normal filling pressures, no pulmonary hypertension and normal cardiac output.   Recommendations: The SVG to right PDA  is now occluded which is new but the RCA has good  left-to-right collaterals.  The AV groove left circumflex was subtotally occluded before and jailed by previous stent.  This could not be crossed with a wire before. Recommend continuing medical therapy.  Assessment and Plan:  1.  Multivessel CAD status post CABG and DES to the circumflex.  She has evidence of graft disease with no targets for PCI based on most recent cardiac catheterization.  Angina symptoms have not progressed on current regimen, continue observation.  Currently on aspirin, Imdur, Plavix, Lopressor, Norvasc, Diovan, and Pravachol.  2.  Essential hypertension, blood pressure is well controlled today on multimodal regimen.  No changes were made.  3. HFmrEF, LVEF 45 to 50% range.  Medication Adjustments/Labs and Tests Ordered: Current medicines are reviewed at length with the patient today.  Concerns regarding medicines are outlined above.   Tests Ordered: No orders of the defined types were placed in this encounter.   Medication Changes: No orders of the defined types were placed in this encounter.   Disposition:  Follow up  6 months.  Signed, Satira Sark, MD, [facility] 06/14/2021 10:43 AM    [facility] [facility] [place], [place], [place] [address] Phone: [phone]; Fax: [phone]

## 2021-06-22 ENCOUNTER — Other Ambulatory Visit: Payer: Self-pay | Admitting: Internal Medicine

## 2021-06-22 DIAGNOSIS — F419 Anxiety disorder, unspecified: Secondary | ICD-10-CM

## 2021-06-28 ENCOUNTER — Institutional Professional Consult (permissible substitution): Payer: Medicare Other | Admitting: Pulmonary Disease

## 2021-07-20 ENCOUNTER — Other Ambulatory Visit: Payer: Self-pay | Admitting: Internal Medicine

## 2021-07-20 DIAGNOSIS — F331 Major depressive disorder, recurrent, moderate: Secondary | ICD-10-CM

## 2021-07-20 DIAGNOSIS — G4700 Insomnia, unspecified: Secondary | ICD-10-CM

## 2021-07-24 ENCOUNTER — Other Ambulatory Visit: Payer: Self-pay | Admitting: Internal Medicine

## 2021-07-24 ENCOUNTER — Telehealth: Payer: Self-pay | Admitting: Cardiology

## 2021-07-24 DIAGNOSIS — F331 Major depressive disorder, recurrent, moderate: Secondary | ICD-10-CM

## 2021-07-24 MED ORDER — CLOPIDOGREL BISULFATE 75 MG PO TABS
75.0000 mg | ORAL_TABLET | Freq: Every day | ORAL | 3 refills | Status: DC
Start: 2021-07-24 — End: 2022-06-04

## 2021-07-24 NOTE — Telephone Encounter (Signed)
?*  STAT* If patient is at the pharmacy, call can be transferred to refill team. ? ? ?1. Which medications need to be refilled? (please list name of each medication and dose if known) clopidogrel (PLAVIX) 75 MG tablet ? ?2. Which pharmacy/location (including street and city if local pharmacy) is medication to be sent to? [facility] [place], [place] ? ?3. Do they need a 30 day or 90 day supply? 90 ? ?

## 2021-07-24 NOTE — Telephone Encounter (Signed)
Done

## 2021-08-03 DIAGNOSIS — N189 Chronic kidney disease, unspecified: Secondary | ICD-10-CM | POA: Diagnosis not present

## 2021-08-03 DIAGNOSIS — E1122 Type 2 diabetes mellitus with diabetic chronic kidney disease: Secondary | ICD-10-CM | POA: Diagnosis not present

## 2021-08-03 DIAGNOSIS — E559 Vitamin D deficiency, unspecified: Secondary | ICD-10-CM | POA: Diagnosis not present

## 2021-08-03 DIAGNOSIS — I129 Hypertensive chronic kidney disease with stage 1 through stage 4 chronic kidney disease, or unspecified chronic kidney disease: Secondary | ICD-10-CM | POA: Diagnosis not present

## 2021-08-03 DIAGNOSIS — R768 Other specified abnormal immunological findings in serum: Secondary | ICD-10-CM | POA: Diagnosis not present

## 2021-08-03 DIAGNOSIS — E211 Secondary hyperparathyroidism, not elsewhere classified: Secondary | ICD-10-CM | POA: Diagnosis not present

## 2021-08-25 ENCOUNTER — Encounter: Payer: Self-pay | Admitting: Internal Medicine

## 2021-08-25 ENCOUNTER — Ambulatory Visit (INDEPENDENT_AMBULATORY_CARE_PROVIDER_SITE_OTHER): Payer: Medicare Other | Admitting: Internal Medicine

## 2021-08-25 ENCOUNTER — Ambulatory Visit (HOSPITAL_COMMUNITY)
Admission: RE | Admit: 2021-08-25 | Discharge: 2021-08-25 | Disposition: A | Discharge status: Home / Self Care | Payer: Medicare Other | Source: Ambulatory Visit | Attending: Internal Medicine | Admitting: Internal Medicine

## 2021-08-25 VITALS — BP 136/78 | HR 65 | Resp 18

## 2021-08-25 DIAGNOSIS — M25572 Pain in left ankle and joints of left foot: Secondary | ICD-10-CM | POA: Insufficient documentation

## 2021-08-25 DIAGNOSIS — S99922A Unspecified injury of left foot, initial encounter: Secondary | ICD-10-CM | POA: Diagnosis present

## 2021-08-25 DIAGNOSIS — M1612 Unilateral primary osteoarthritis, left hip: Secondary | ICD-10-CM | POA: Diagnosis not present

## 2021-08-25 DIAGNOSIS — T148XXA Other injury of unspecified body region, initial encounter: Secondary | ICD-10-CM

## 2021-08-25 DIAGNOSIS — M79672 Pain in left foot: Secondary | ICD-10-CM | POA: Diagnosis not present

## 2021-08-25 DIAGNOSIS — W19XXXA Unspecified fall, initial encounter: Secondary | ICD-10-CM | POA: Diagnosis not present

## 2021-08-25 DIAGNOSIS — M79652 Pain in left thigh: Secondary | ICD-10-CM | POA: Diagnosis not present

## 2021-08-25 MED ORDER — TRAMADOL HCL 50 MG PO TABS: 50.0000 mg | ORAL_TABLET | Freq: Two times a day (BID) | ORAL | 0 refills | Status: DC | PRN

## 2021-08-25 NOTE — Patient Instructions (Signed)
Please take Tramadol for severe pain. ? ?Please get X-ray of left ankle done at [facility]. ?

## 2021-08-25 NOTE — Progress Notes (Signed)
? ?Acute Office Visit ? ?Subjective:  ? ? Patient ID: [name], female    DOB: 01-06-1946, 76 y.o.   MRN: [id number] ? ?Chief Complaint  ?Patient presents with  ? Fall  ?  Pt fell 08-22-21 in bathroom her legs gave out she cannot put weight on left leg   ? ? ?HPI ?Patient is in today for evaluation after a fall in her bathroom on 04/25.  She recalls that she wanted to use restroom at nighttime, and the next thing that she remembers is she was on the floor.  She attributes it to her leg giving out.  Of note, she denies any chest pain, dyspnea or palpitations before the fall or during that night.  She has chronic dizziness.  She denies any seizure-like activity before the fall.  Her daughter states that she was having leg pain on that evening.  Denies any head injury.  She has bruising over her b/l UE and LE.  She has acute left ankle pain and has severe pain upon weightbearing.  She has been mostly chair-bound since the fall, except for using restroom. ? ?Past Medical History:  ?Diagnosis Date  ? Acute ischemic colitis ([place]) 09/11/2005  ? Anxiety   ? Arthritis   ? Atherosclerotic vascular disease   ? Calcified plaque at the origin of the celiac and SMA  ? CHF (congestive heart failure) ([place])   ? Coronary atherosclerosis of native coronary artery   ? Mulitvessel LVEF 50-50%, DES circ 1/11, occluded SVG to OM and SVG to diagonal , LVEF 60%  ? COVID-19 virus infection 11/07/2020  ? Depression   ? Diverticulosis of colon   ? Essential hypertension   ? Hypothyroidism   ? Mesenteric ischemia ([place])   ? Mitral regurgitation   ? Mild to moderate  ? Mixed hyperlipidemia   ? Myocardial infarction [facility]) 1990  ? Obesity   ? Orthostatic hypotension   ? PAD (peripheral artery disease) ([place])   ? PVC's (premature ventricular contractions)   ? Schizophrenia ([place])   ? Sleep apnea   ? CPAP  ? TIA (transient ischemic attack) 11/20/2017  ? Tubular adenoma   ? Type 2 diabetes mellitus ([place])   ? Vascular complications of mesenteric  artery   ? ? ?Past Surgical History:  ?Procedure Laterality Date  ? BACK SURGERY    ? Lumbar spine surgery  ? Carotid endarectomy Bilateral   ? CARPAL TUNNEL RELEASE    ? Bilateral  ? CATARACT EXTRACTION W/PHACO Right 08/24/2013  ? Procedure: CATARACT EXTRACTION PHACO AND INTRAOCULAR LENS PLACEMENT ([facility]);  Surgeon: Tonny Branch, MD;  Location: [facility];  Service: Ophthalmology;  Laterality: Right;  CDE 8.68  ? COLONOSCOPY  09/14/2005  ? Dr. Leonard Schwartz colitis  ? COLONOSCOPY WITH ESOPHAGOGASTRODUODENOSCOPY (EGD)  04/14/2012  ? Dr. Gala Romney- EGD= gastric erosions of doubtful clinical significance per bx- chronic inactive gastritis, benign small bowel mucosa. TCS=colonic diverticulosis, tubular adenoma  ? CORONARY ARTERY BYPASS GRAFT    ? LIMA-LAD; SVG-OM; SVG-DX in [place]  ? NECK SURGERY    ? Cervical laminectomy  ? PARTIAL HYSTERECTOMY    ? RIGHT/LEFT HEART CATH AND CORONARY/GRAFT ANGIOGRAPHY N/A 04/12/2021  ? Procedure: RIGHT/LEFT HEART CATH AND CORONARY/GRAFT ANGIOGRAPHY;  Surgeon: Wellington Hampshire, MD;  Location: [facility];  Service: Cardiovascular;  Laterality: N/A;  ? stents x4    ? ? ?Family History  ?Problem Relation Age of Onset  ? Alcohol abuse Mother   ? Alcohol abuse Father   ?  Coronary artery disease Other   ? Hypertension Other   ? Crohn's disease Sister 73  ? Stroke Daughter   ? ? ?Social History  ? ?Socioeconomic History  ? Marital status: Widowed  ?  Spouse name: Not on file  ? Number of children: 4  ? Years of education: Not on file  ? Highest education level: Not on file  ?Occupational History  ? Occupation: DISABLED  ?  Employer: UNEMPLOYED  ?Tobacco Use  ? Smoking status: Light Smoker  ?  Packs/day: 0.50  ?  Years: 50.00  ?  Pack years: 25.00  ?  Types: Cigarettes  ?  Start date: 12/30/2018  ? Smokeless tobacco: Never  ?Vaping Use  ? Vaping Use: Never used  ?Substance and Sexual Activity  ? Alcohol use: Yes  ?  Alcohol/week: 0.0 standard drinks  ?  Comment: occasionally   ? Drug use: No  ?  Sexual activity: Not on file  ?Other Topics Concern  ? Not on file  ?Social History Narrative  ? LIves alone  ? ?Social Determinants of Health  ? ?Financial Resource Strain: Low Risk   ? Difficulty of Paying Living Expenses: Not hard at all  ?Food Insecurity: No Food Insecurity  ? Worried About Charity fundraiser in the Last Year: Never true  ? Ran Out of Food in the Last Year: Never true  ?Transportation Needs: No Transportation Needs  ? Lack of Transportation (Medical): No  ? Lack of Transportation (Non-Medical): No  ?Physical Activity: Inactive  ? Days of Exercise per Week: 0 days  ? Minutes of Exercise per Session: 0 min  ?Stress: No Stress Concern Present  ? Feeling of Stress : Only a little  ?Social Connections: Socially Isolated  ? Frequency of Communication with Friends and Family: More than three times a week  ? Frequency of Social Gatherings with Friends and Family: Never  ? Attends Religious Services: Never  ? Active Member of Clubs or Organizations: No  ? Attends Archivist Meetings: Never  ? Marital Status: Widowed  ?Intimate Partner Violence: Not At Risk  ? Fear of Current or Ex-Partner: No  ? Emotionally Abused: No  ? Physically Abused: No  ? Sexually Abused: No  ? ? ?Outpatient Medications Prior to Visit  ?Medication Sig Dispense Refill  ? albuterol (VENTOLIN HFA) 108 (90 Base) MCG/ACT inhaler USE 2 INHALATIONS BY MOUTH  EVERY 6 HOURS AS NEEDED FOR WHEEZING OR SHORTNESS OF  BREATH (Patient taking differently: 1 puff every 6 (six) hours as needed for shortness of breath or wheezing.) 18 g 0  ? ALPRAZolam (XANAX) 0.5 MG tablet TAKE 1 TABLET BY MOUTH TWICE DAILY AS NEEDED FOR ANXIETY 60 tablet 2  ? amLODipine (NORVASC) 2.5 MG tablet TAKE ONE (1) TABLET BY MOUTH EVERY DAY 90 tablet 1  ? aspirin EC 81 MG tablet Take 81 mg by mouth at bedtime.    ? Biotin 10000 MCG TABS Take 1 tablet by mouth daily.    ? blood glucose meter kit and supplies Dispense based on patient and insurance preference. Use  up to four times daily as directed. (FOR ICD-10 E10.9, E11.9). 1 each 0  ? calcitRIOL (ROCALTROL) 0.25 MCG capsule Take 0.25 mcg by mouth 2 (two) times a week.    ? Cholecalciferol (VITAMIN D3) 50 MCG (2000 UT) TABS Take 1 tablet by mouth daily.    ? clopidogrel (PLAVIX) 75 MG tablet Take 1 tablet (75 mg total) by mouth daily. 90 tablet 3  ?  Collagen-Vitamin C (COLLAGEN PLUS VITAMIN C PO) Take 1 tablet by mouth daily.    ? diphenoxylate-atropine (LOMOTIL) 2.5-0.025 MG tablet Take one tablet by mouth twice daily ,a s needed, for loose stool 15 tablet 0  ? docusate sodium (COLACE) 100 MG capsule Take 100 mg by mouth as needed for mild constipation.    ? FIBER COMPLETE PO Take 2 tablets by mouth daily. Fiber Well gummies    ? fluticasone (FLOVENT HFA) 110 MCG/ACT inhaler Inhale 1 puff into the lungs in the morning and at bedtime. (Patient taking differently: Inhale 2 puffs into the lungs in the morning and at bedtime.) 1 each 12  ? isosorbide mononitrate (IMDUR) 60 MG 24 hr tablet Take 1 tablet (60 mg total) by mouth daily. 90 tablet 2  ? levothyroxine (SYNTHROID) 50 MCG tablet Take 1 tablet (50 mcg total) by mouth daily before breakfast. 30 tablet 11  ? Melatonin 10 MG TABS Take 10 mg by mouth at bedtime as needed (sleep).    ? metoprolol tartrate (LOPRESSOR) 50 MG tablet Take 1 tablet (50 mg total) by mouth 2 (two) times daily. 90 tablet 0  ? nitroGLYCERIN (NITROSTAT) 0.4 MG SL tablet Place 1 tablet (0.4 mg total) under the tongue every 5 (five) minutes as needed. (Patient taking differently: Place 0.4 mg under the tongue every 5 (five) minutes as needed for chest pain.) 90 tablet 3  ? ondansetron (ZOFRAN ODT) 4 MG disintegrating tablet Take 1 tablet (4 mg total) by mouth every 8 (eight) hours as needed for nausea or vomiting. 20 tablet 0  ? OneTouch Delica Lancets 88T MISC 4 TIMES DAILY 100 each 5  ? ONETOUCH VERIO test strip 4 TIMES DAILY 100 each 5  ? oxyCODONE-acetaminophen (PERCOCET/ROXICET) 5-325 MG tablet Take  1 tablet by mouth every 6 (six) hours as needed for severe pain. 15 tablet 0  ? pantoprazole (PROTONIX) 40 MG tablet TAKE ONE TABLET BY MOUTH EVERY MORNING 90 tablet 0  ? Potassium 99 MG TABS Take

## 2021-09-06 ENCOUNTER — Ambulatory Visit: Payer: Medicare Other | Admitting: Orthopedic Surgery

## 2021-09-07 ENCOUNTER — Telehealth: Payer: Self-pay | Admitting: Internal Medicine

## 2021-09-07 NOTE — Telephone Encounter (Signed)
Called in on patient behalf in regard to low BP  ? ?Wants a call back to see what can do to pull it up. ? ?States that patient will not go to ER or UC ?

## 2021-09-07 NOTE — Telephone Encounter (Signed)
PT granddaughter advised with verbal understanding she is trying to take her to ER  ?

## 2021-09-07 NOTE — Telephone Encounter (Signed)
Spoke with Joelene Millin stated grandmothers bp was 60/70 advised if running this low she needed to be evaluated at urgent care or er stated grandmother would not go to urgent care or er made an appt with gloria 09-08-21  to check bp but advised it was strongly suggested she go to Er or urgent care for evaluation ?

## 2021-09-08 ENCOUNTER — Ambulatory Visit: Payer: Medicare Other | Admitting: Family Medicine

## 2021-09-12 ENCOUNTER — Encounter: Payer: Self-pay | Admitting: Internal Medicine

## 2021-09-12 ENCOUNTER — Ambulatory Visit (INDEPENDENT_AMBULATORY_CARE_PROVIDER_SITE_OTHER): Payer: Medicare Other | Admitting: Internal Medicine

## 2021-09-12 VITALS — BP 124/68 | HR 48 | Resp 18 | Ht 63.0 in | Wt 215.6 lb

## 2021-09-12 DIAGNOSIS — R1084 Generalized abdominal pain: Secondary | ICD-10-CM

## 2021-09-12 DIAGNOSIS — K219 Gastro-esophageal reflux disease without esophagitis: Secondary | ICD-10-CM

## 2021-09-12 DIAGNOSIS — M1A9XX0 Chronic gout, unspecified, without tophus (tophi): Secondary | ICD-10-CM

## 2021-09-12 DIAGNOSIS — Z0001 Encounter for general adult medical examination with abnormal findings: Secondary | ICD-10-CM | POA: Insufficient documentation

## 2021-09-12 DIAGNOSIS — K551 Chronic vascular disorders of intestine: Secondary | ICD-10-CM

## 2021-09-12 DIAGNOSIS — F331 Major depressive disorder, recurrent, moderate: Secondary | ICD-10-CM

## 2021-09-12 DIAGNOSIS — E1169 Type 2 diabetes mellitus with other specified complication: Secondary | ICD-10-CM

## 2021-09-12 DIAGNOSIS — Z8719 Personal history of other diseases of the digestive system: Secondary | ICD-10-CM | POA: Diagnosis not present

## 2021-09-12 MED ORDER — FAMOTIDINE 40 MG PO TABS: 40.0000 mg | ORAL_TABLET | Freq: Every day | ORAL | 5 refills | Status: DC | PRN

## 2021-09-12 MED ORDER — CITALOPRAM HYDROBROMIDE 10 MG PO TABS: 10.0000 mg | ORAL_TABLET | Freq: Every day | ORAL | 2 refills | Status: DC

## 2021-09-12 NOTE — Assessment & Plan Note (Signed)
?  Lab Results  ?Component Value Date  ? HGBA1C 6.3 (H) 03/10/2021  ? ? ?Diet controlled ?Avoid tighter control in her case due to risk of hypoglycemia related fall ?

## 2021-09-12 NOTE — Assessment & Plan Note (Signed)
Uncontrolled with pantoprazole 40 mg daily ?Added Pepcid as needed ?Referred to GI ?

## 2021-09-12 NOTE — Assessment & Plan Note (Signed)
Her right great toe swelling and redness could be gout ?Check uric acid level ?

## 2021-09-12 NOTE — Progress Notes (Addendum)
? ?Established Patient Office Visit ? ?Subjective:  ?Patient ID: [name], female    DOB: Aug 26, 1945  Age: 76 y.o. MRN: [id number] ? ?CC:  ?Chief Complaint  ?Patient presents with  ? Annual Exam  ?  Annual exam pt bp was running low advised to stop taking amlodipine and valsartan has stopped this since has headache and nausea since stopping   ? ? ?HPI ?[name] is a 76 y.o. female with past medical history of hypertension, hyperlipidemia, mesenteric ischemia status post right hemicolectomy, coronary artery disease, hypothyroidism, type 2 diabetes mellitus, depression with anxiety, obesity, arthritis, chronic back pain who presents for annual physical. ? ?HTN: Her BP was running low at at home recently, around 90s over 60s and was told to stop taking amlodipine and valsartan.  Today, her BP is well controlled.  She still complains of mild headache and nausea.  Denies any dizziness, chest pain or palpitations. ? ?She complains of generalized abdominal pain, with nausea.  Pain is intermittent, spasmodic and lasts for few minutes. She has history of mesenteric ischemia and has had right hemicolectomy in the past.  She currently denies any melena or hematochezia.  Denies any fever or chills, diarrhea. ? ?She has anhedonia, insomnia, lack of interest in routine activities and agitation. Denies any SI or HI. She has stopped taking Zoloft as it was not helping. ? ?She also reports chronic right great toe swelling and redness, which flares up at times. ? ? ? ?Past Medical History:  ?Diagnosis Date  ? Acute ischemic colitis ([place]) 09/11/2005  ? Anxiety   ? Arthritis   ? Atherosclerotic vascular disease   ? Calcified plaque at the origin of the celiac and SMA  ? CHF (congestive heart failure) ([place])   ? Coronary atherosclerosis of native coronary artery   ? Mulitvessel LVEF 50-50%, DES circ 1/11, occluded SVG to OM and SVG to diagonal , LVEF 60%  ? COVID-19 virus infection 11/07/2020  ? Depression   ?  Diverticulosis of colon   ? Essential hypertension   ? Hypothyroidism   ? Mesenteric ischemia ([place])   ? Mitral regurgitation   ? Mild to moderate  ? Mixed hyperlipidemia   ? Myocardial infarction [facility]) 1990  ? Obesity   ? Orthostatic hypotension   ? PAD (peripheral artery disease) ([place])   ? PVC's (premature ventricular contractions)   ? Schizophrenia ([place])   ? Sleep apnea   ? CPAP  ? TIA (transient ischemic attack) 11/20/2017  ? Tubular adenoma   ? Type 2 diabetes mellitus ([place])   ? Vascular complications of mesenteric artery   ? ? ?Past Surgical History:  ?Procedure Laterality Date  ? BACK SURGERY    ? Lumbar spine surgery  ? Carotid endarectomy Bilateral   ? CARPAL TUNNEL RELEASE    ? Bilateral  ? CATARACT EXTRACTION W/PHACO Right 08/24/2013  ? Procedure: CATARACT EXTRACTION PHACO AND INTRAOCULAR LENS PLACEMENT (IOC);  Surgeon: Tonny Branch, MD;  Location: [facility];  Service: Ophthalmology;  Laterality: Right;  CDE 8.68  ? COLONOSCOPY  09/14/2005  ? Dr. Leonard Schwartz colitis  ? COLONOSCOPY WITH ESOPHAGOGASTRODUODENOSCOPY (EGD)  04/14/2012  ? Dr. Gala Romney- EGD= gastric erosions of doubtful clinical significance per bx- chronic inactive gastritis, benign small bowel mucosa. TCS=colonic diverticulosis, tubular adenoma  ? CORONARY ARTERY BYPASS GRAFT    ? LIMA-LAD; SVG-OM; SVG-DX in [place]  ? NECK SURGERY    ? Cervical laminectomy  ? PARTIAL HYSTERECTOMY    ? RIGHT/LEFT HEART CATH  AND CORONARY/GRAFT ANGIOGRAPHY N/A 04/12/2021  ? Procedure: RIGHT/LEFT HEART CATH AND CORONARY/GRAFT ANGIOGRAPHY;  Surgeon: Wellington Hampshire, MD;  Location: [facility];  Service: Cardiovascular;  Laterality: N/A;  ? stents x4    ? ? ?Family History  ?Problem Relation Age of Onset  ? Alcohol abuse Mother   ? Alcohol abuse Father   ? Coronary artery disease Other   ? Hypertension Other   ? Crohn's disease Sister 23  ? Stroke Daughter   ? ? ?Social History  ? ?Socioeconomic History  ? Marital status: Widowed  ?  Spouse name: Not on file   ? Number of children: 4  ? Years of education: Not on file  ? Highest education level: Not on file  ?Occupational History  ? Occupation: DISABLED  ?  Employer: UNEMPLOYED  ?Tobacco Use  ? Smoking status: Light Smoker  ?  Packs/day: 0.50  ?  Years: 50.00  ?  Pack years: 25.00  ?  Types: Cigarettes  ?  Start date: 12/30/2018  ? Smokeless tobacco: Never  ?Vaping Use  ? Vaping Use: Never used  ?Substance and Sexual Activity  ? Alcohol use: Yes  ?  Alcohol/week: 0.0 standard drinks  ?  Comment: occasionally   ? Drug use: No  ? Sexual activity: Not on file  ?Other Topics Concern  ? Not on file  ?Social History Narrative  ? LIves alone  ? ?Social Determinants of Health  ? ?Financial Resource Strain: Low Risk   ? Difficulty of Paying Living Expenses: Not hard at all  ?Food Insecurity: No Food Insecurity  ? Worried About Charity fundraiser in the Last Year: Never true  ? Ran Out of Food in the Last Year: Never true  ?Transportation Needs: No Transportation Needs  ? Lack of Transportation (Medical): No  ? Lack of Transportation (Non-Medical): No  ?Physical Activity: Inactive  ? Days of Exercise per Week: 0 days  ? Minutes of Exercise per Session: 0 min  ?Stress: No Stress Concern Present  ? Feeling of Stress : Only a little  ?Social Connections: Socially Isolated  ? Frequency of Communication with Friends and Family: More than three times a week  ? Frequency of Social Gatherings with Friends and Family: Never  ? Attends Religious Services: Never  ? Active Member of Clubs or Organizations: No  ? Attends Archivist Meetings: Never  ? Marital Status: Widowed  ?Intimate Partner Violence: Not At Risk  ? Fear of Current or Ex-Partner: No  ? Emotionally Abused: No  ? Physically Abused: No  ? Sexually Abused: No  ? ? ?Outpatient Medications Prior to Visit  ?Medication Sig Dispense Refill  ? albuterol (VENTOLIN HFA) 108 (90 Base) MCG/ACT inhaler USE 2 INHALATIONS BY MOUTH  EVERY 6 HOURS AS NEEDED FOR WHEEZING OR SHORTNESS OF   BREATH (Patient taking differently: 1 puff every 6 (six) hours as needed for shortness of breath or wheezing.) 18 g 0  ? ALPRAZolam (XANAX) 0.5 MG tablet TAKE 1 TABLET BY MOUTH TWICE DAILY AS NEEDED FOR ANXIETY 60 tablet 2  ? aspirin EC 81 MG tablet Take 81 mg by mouth at bedtime.    ? Biotin 10000 MCG TABS Take 1 tablet by mouth daily.    ? blood glucose meter kit and supplies Dispense based on patient and insurance preference. Use up to four times daily as directed. (FOR ICD-10 E10.9, E11.9). 1 each 0  ? calcitRIOL (ROCALTROL) 0.25 MCG capsule Take 0.25 mcg by  mouth 2 (two) times a week.    ? Cholecalciferol (VITAMIN D3) 50 MCG (2000 UT) TABS Take 1 tablet by mouth daily.    ? clopidogrel (PLAVIX) 75 MG tablet Take 1 tablet (75 mg total) by mouth daily. 90 tablet 3  ? Collagen-Vitamin C (COLLAGEN PLUS VITAMIN C PO) Take 1 tablet by mouth daily.    ? docusate sodium (COLACE) 100 MG capsule Take 100 mg by mouth as needed for mild constipation.    ? FIBER COMPLETE PO Take 2 tablets by mouth daily. Fiber Well gummies    ? fluticasone (FLOVENT HFA) 110 MCG/ACT inhaler Inhale 1 puff into the lungs in the morning and at bedtime. (Patient taking differently: Inhale 2 puffs into the lungs in the morning and at bedtime.) 1 each 12  ? isosorbide mononitrate (IMDUR) 60 MG 24 hr tablet Take 1 tablet (60 mg total) by mouth daily. 90 tablet 2  ? levothyroxine (SYNTHROID) 50 MCG tablet Take 1 tablet (50 mcg total) by mouth daily before breakfast. 30 tablet 11  ? Melatonin 10 MG TABS Take 10 mg by mouth at bedtime as needed (sleep).    ? metoprolol tartrate (LOPRESSOR) 50 MG tablet Take 1 tablet (50 mg total) by mouth 2 (two) times daily. 90 tablet 0  ? nitroGLYCERIN (NITROSTAT) 0.4 MG SL tablet Place 1 tablet (0.4 mg total) under the tongue every 5 (five) minutes as needed. (Patient taking differently: Place 0.4 mg under the tongue every 5 (five) minutes as needed for chest pain.) 90 tablet 3  ? ondansetron (ZOFRAN ODT) 4 MG  disintegrating tablet Take 1 tablet (4 mg total) by mouth every 8 (eight) hours as needed for nausea or vomiting. 20 tablet 0  ? OneTouch Delica Lancets 70Y MISC 4 TIMES DAILY 100 each 5  ? ONETOUCH VERIO test st

## 2021-09-12 NOTE — Assessment & Plan Note (Signed)
[place] Office Visit from 09/12/2021 in [facility]  ?PHQ-9 Total Score 12  ?  ? ?Uncontrolled ?Had started Zoloft 50 mg daily, but she stopped taking it as it was not helping her ?Switched to Celexa ?Takes Ambien as needed for insomnia ?

## 2021-09-12 NOTE — Assessment & Plan Note (Signed)
Has acute on chronic abdominal pain, will check CT abdomen pelvis with contrast ?

## 2021-09-12 NOTE — Assessment & Plan Note (Signed)

## 2021-09-12 NOTE — Patient Instructions (Addendum)
Please take Pepcid as needed for upper abdominal pain in addition to Pantoprazole daily. ? ?Please start taking Celexa instead of Zoloft. ? ?Please stop taking Amlodipine. ? ?Please consider getting Shingrix and Tdap vaccine at your local pharmacy. ?

## 2021-09-13 ENCOUNTER — Other Ambulatory Visit: Payer: Self-pay | Admitting: Internal Medicine

## 2021-09-14 LAB — LIPID PANEL
Chol/HDL Ratio: 3 ratio (ref 0.0–4.4)
Cholesterol, Total: 137 mg/dL (ref 100–199)
HDL: 46 mg/dL (ref >39)
LDL Chol Calc (NIH): 68 mg/dL (ref 0–99)
Triglycerides: 128 mg/dL (ref 0–149)
VLDL Cholesterol Cal: 23 mg/dL (ref 5–40)

## 2021-09-14 LAB — CBC WITH DIFFERENTIAL/PLATELET
Basophils Absolute: 0.1 10*3/uL (ref 0.0–0.2)
Basos: 1 %
EOS (ABSOLUTE): 0.2 10*3/uL (ref 0.0–0.4)
Eos: 2 %
Hematocrit: 41.2 % (ref 34.0–46.6)
Hemoglobin: 13.7 g/dL (ref 11.1–15.9)
Immature Grans (Abs): 0 10*3/uL (ref 0.0–0.1)
Immature Granulocytes: 0 %
Lymphocytes Absolute: 2.6 10*3/uL (ref 0.7–3.1)
Lymphs: 38 %
MCH: 31.6 pg (ref 26.6–33.0)
MCHC: 33.3 g/dL (ref 31.5–35.7)
MCV: 95 fL (ref 79–97)
Monocytes Absolute: 0.8 10*3/uL (ref 0.1–0.9)
Monocytes: 11 %
Neutrophils Absolute: 3.3 10*3/uL (ref 1.4–7.0)
Neutrophils: 48 %
Platelets: 269 10*3/uL (ref 150–450)
RBC: 4.33 x10E6/uL (ref 3.77–5.28)
RDW: 13.4 % (ref 11.7–15.4)
WBC: 6.9 10*3/uL (ref 3.4–10.8)

## 2021-09-14 LAB — URIC ACID: Uric Acid: 7.2 mg/dL (ref 3.1–7.9)

## 2021-09-14 LAB — CMP14+EGFR
ALT: 13 IU/L (ref 0–32)
AST: 30 IU/L (ref 0–40)
Albumin/Globulin Ratio: 1.4 (ref 1.2–2.2)
Albumin: 4.4 g/dL (ref 3.7–4.7)
Alkaline Phosphatase: 114 IU/L (ref 44–121)
BUN/Creatinine Ratio: 13 (ref 12–28)
BUN: 16 mg/dL (ref 8–27)
Bilirubin Total: 0.7 mg/dL (ref 0.0–1.2)
CO2: 25 mmol/L (ref 20–29)
Calcium: 10.2 mg/dL (ref 8.7–10.3)
Chloride: 100 mmol/L (ref 96–106)
Creatinine, Ser: 1.27 mg/dL — ABNORMAL HIGH (ref 0.57–1.00)
Globulin, Total: 3.2 g/dL (ref 1.5–4.5)
Glucose: 105 mg/dL — ABNORMAL HIGH (ref 70–99)
Potassium: 5.9 mmol/L — ABNORMAL HIGH (ref 3.5–5.2)
Sodium: 139 mmol/L (ref 134–144)
Total Protein: 7.6 g/dL (ref 6.0–8.5)
eGFR: 44 mL/min/{1.73_m2} — ABNORMAL LOW (ref >59)

## 2021-09-14 LAB — TSH: TSH: 3.45 u[IU]/mL (ref 0.450–4.500)

## 2021-09-14 LAB — MICROALBUMIN / CREATININE URINE RATIO
Creatinine, Urine: 62.8 mg/dL
Microalb/Creat Ratio: 6 mg/g creat (ref 0–29)
Microalbumin, Urine: 3.8 ug/mL

## 2021-09-14 LAB — HEMOGLOBIN A1C
Est. average glucose Bld gHb Est-mCnc: 123 mg/dL
Hgb A1c MFr Bld: 5.9 % — ABNORMAL HIGH (ref 4.8–5.6)

## 2021-09-19 ENCOUNTER — Encounter: Payer: Self-pay | Admitting: Orthopedic Surgery

## 2021-09-19 ENCOUNTER — Ambulatory Visit: Payer: Medicare Other | Admitting: Orthopedic Surgery

## 2021-09-19 VITALS — Ht 63.0 in | Wt 215.0 lb

## 2021-09-19 DIAGNOSIS — M2021 Hallux rigidus, right foot: Secondary | ICD-10-CM | POA: Diagnosis not present

## 2021-09-19 DIAGNOSIS — M79672 Pain in left foot: Secondary | ICD-10-CM | POA: Diagnosis not present

## 2021-09-19 NOTE — Progress Notes (Signed)
New Patient Visit  Assessment: [name] is a 76 y.o. female with the following: 1. Rigidity of first metatarsophalangeal joint of right foot 2. Pain in left foot  Plan: [name] has pain in both feet.  On physical exam, she has a very rigid first MTP joint.  There are some large osteophytes.  This is likely contributing to her pain.  In the left foot, she has some midfoot arthritis, as well as some ankle arthritis.  The swelling she is experiencing, can be improved with compression stockings and a brace, which could provide some stability to the midfoot.  I have also recommended stiff soled shoes to provide more support, and limit the motion of these parts of her feet.  Voltaren gel for both feet.  Follow-up as needed.  Follow-up: Return if symptoms worsen or fail to improve.  Subjective:  Chief Complaint  Patient presents with   Foot Pain    Lt foot pain     History of Present Illness: [name] is a 76 y.o. female who has been referred by  Ihor Dow, MD for evaluation of left foot pain.  She has had progressively worsening pain in the left ankle and the left foot.  She has been told she has arthritis.  She notes swelling in the left foot.  She been wearing compression sleeve, but this is worsening the swelling.  She also has pain over the medial midfoot, and has difficulty ambulating on the medial forefoot.  She has pain in the first MTP joint on the right foot.  No prior injury.  She has noticed the swelling on the dorsal aspect of this joint, which is tender to palpation.   Review of Systems: No fevers or chills No numbness or tingling No chest pain No shortness of breath No bowel or bladder dysfunction No GI distress No headaches   Medical History:  Past Medical History:  Diagnosis Date   Acute ischemic colitis ([place]) 09/11/2005   Anxiety    Arthritis    Atherosclerotic vascular disease    Calcified plaque at the origin of the celiac and SMA    CHF (congestive heart failure) ([facility])    Coronary atherosclerosis of native coronary artery    Mulitvessel LVEF 50-50%, DES circ 1/11, occluded SVG to OM and SVG to diagonal , LVEF 60%   COVID-19 virus infection 11/07/2020   Depression    Diverticulosis of colon    Essential hypertension    Hypothyroidism    Mesenteric ischemia ([facility])    Mitral regurgitation    Mild to moderate   Mixed hyperlipidemia    Myocardial infarction ([place]) 1990   Obesity    Orthostatic hypotension    PAD (peripheral artery disease) ([facility])    PVC's (premature ventricular contractions)    Schizophrenia ([facility])    Sleep apnea    CPAP   TIA (transient ischemic attack) 11/20/2017   Tubular adenoma    Type 2 diabetes mellitus ([facility])    Vascular complications of mesenteric artery     Past Surgical History:  Procedure Laterality Date   BACK SURGERY     Lumbar spine surgery   Carotid endarectomy Bilateral    CARPAL TUNNEL RELEASE     Bilateral   CATARACT EXTRACTION W/PHACO Right 08/24/2013   Procedure: CATARACT EXTRACTION PHACO AND INTRAOCULAR LENS PLACEMENT ([place]);  Surgeon: Tonny Branch, MD;  Location: [facility];  Service: Ophthalmology;  Laterality: Right;  CDE 8.68   COLONOSCOPY  09/14/2005  Dr. Leonard Schwartz colitis   COLONOSCOPY WITH ESOPHAGOGASTRODUODENOSCOPY (EGD)  04/14/2012   Dr. Gala Romney- EGD= gastric erosions of doubtful clinical significance per bx- chronic inactive gastritis, benign small bowel mucosa. TCS=colonic diverticulosis, tubular adenoma   CORONARY ARTERY BYPASS GRAFT     LIMA-LAD; SVG-OM; SVG-DX in [place]     Cervical laminectomy   PARTIAL HYSTERECTOMY     RIGHT/LEFT HEART CATH AND CORONARY/GRAFT ANGIOGRAPHY N/A 04/12/2021   Procedure: RIGHT/LEFT HEART CATH AND CORONARY/GRAFT ANGIOGRAPHY;  Surgeon: Wellington Hampshire, MD;  Location: [facility];  Service: Cardiovascular;  Laterality: N/A;   stents x4      Family History  Problem Relation Age of Onset   Alcohol  abuse Mother    Alcohol abuse Father    Coronary artery disease Other    Hypertension Other    Crohn's disease Sister 31   Stroke Daughter    Social History   Tobacco Use   Smoking status: Light Smoker    Packs/day: 0.50    Years: 50.00    Pack years: 25.00    Types: Cigarettes    Start date: 12/30/2018   Smokeless tobacco: Never  Vaping Use   Vaping Use: Never used  Substance Use Topics   Alcohol use: Yes    Alcohol/week: 0.0 standard drinks    Comment: occasionally    Drug use: No    No Known Allergies  No outpatient medications have been marked as taking for the 09/19/21 encounter (Office Visit) with Mordecai Rasmussen, MD.   Current Facility-Administered Medications for the 09/19/21 encounter (Office Visit) with Mordecai Rasmussen, MD  Medication   sodium chloride flush (NS) 0.9 % injection 3 mL    Objective: Ht '5\' 3"'$  (1.6 m)   Wt 215 lb (97.5 kg)   BMI 38.09 kg/m   Physical Exam:  General: Alert and oriented. and No acute distress. Gait: Slow, steady gait.  Evaluation of the left foot demonstrates no acute injuries.  Minimal swelling is appreciated.  She has some tenderness to palpation on the medial border of the left foot.  Mild tenderness to palpation of the plantar aspect of the first MTP.  She does have tenderness to palpation along the first metatarsal.  On the right foot, she has very little plantarflexion of the first MTP joint.  There is a large osteophyte, with overlying redness.  She also has a large osteophyte which is palpable in the lateral aspect of the MTP joint.  Toes are warm and well-perfused bilaterally.  IMAGING: I personally reviewed images previously obtained in clinic   New Medications:  No orders of the defined types were placed in this encounter.  Left foot and ankle  No acute fracture or dislocation is noted. Calcaneal spurring is seen. Tarsal degenerative changes are seen as well. No soft tissue abnormality is noted.   Mordecai Rasmussen,  MD  09/19/2021 10:33 PM

## 2021-09-19 NOTE — Patient Instructions (Signed)
Compression stockings to help with swelling.  Ankle brace, with some chronic compression across the midfoot  Voltaren gel for the left foot, as well as the right big toe.  Recommend stiff soled shoes to provide more stability to your feet.  Follow-up as needed.

## 2021-10-04 ENCOUNTER — Ambulatory Visit: Payer: Medicare Other | Admitting: Internal Medicine

## 2021-10-09 ENCOUNTER — Ambulatory Visit (HOSPITAL_COMMUNITY): Payer: Medicare Other

## 2021-10-17 ENCOUNTER — Other Ambulatory Visit: Payer: Self-pay | Admitting: Internal Medicine

## 2021-10-17 DIAGNOSIS — F419 Anxiety disorder, unspecified: Secondary | ICD-10-CM

## 2021-10-25 DIAGNOSIS — S93602A Unspecified sprain of left foot, initial encounter: Secondary | ICD-10-CM | POA: Diagnosis not present

## 2021-10-25 DIAGNOSIS — S93402A Sprain of unspecified ligament of left ankle, initial encounter: Secondary | ICD-10-CM | POA: Diagnosis not present

## 2021-10-26 ENCOUNTER — Ambulatory Visit: Payer: Medicare Other | Admitting: Internal Medicine

## 2021-11-01 ENCOUNTER — Ambulatory Visit (INDEPENDENT_AMBULATORY_CARE_PROVIDER_SITE_OTHER): Payer: Medicare Other | Admitting: Orthopedic Surgery

## 2021-11-01 ENCOUNTER — Encounter: Payer: Self-pay | Admitting: Orthopedic Surgery

## 2021-11-01 ENCOUNTER — Ambulatory Visit (INDEPENDENT_AMBULATORY_CARE_PROVIDER_SITE_OTHER): Payer: Medicare Other

## 2021-11-01 VITALS — Ht 63.0 in | Wt 215.0 lb

## 2021-11-01 DIAGNOSIS — M79672 Pain in left foot: Secondary | ICD-10-CM

## 2021-11-02 ENCOUNTER — Encounter: Payer: Self-pay | Admitting: Orthopedic Surgery

## 2021-11-02 NOTE — Progress Notes (Signed)
Return patient Visit  Assessment: [name] is a 76 y.o. female with the following: 1. Pain in left foot  Plan: [name] has pain in her left foot.  She had a fall recently, and presented to an urgent care.  The urgent care took x-rays, and told her that she had evidence of a prior fracture.  Repeat x-rays today are without obvious fracture.  On physical exam, she does have some swelling and bruising. Presentation is most consistent with a sprained ankle.  I anticipate this will improve with time.  I do not think you will have any influence on the pain that she was previously experiencing.  We discussed a brief period of immobilization in a cast, versus a cam boot.  She would like to proceed with a boot.  This was fitted today.  She can weight-bear as tolerated, but recommend she continues with protected weightbearing.   Follow-up: Return for 2-3 weeks.  Subjective:  Chief Complaint  Patient presents with   Foot Pain    Lt foot pain     History of Present Illness: [name] is a 75 y.o. female who returns to clinic for repeat evaluation of left foot and ankle pain.  She reports falling within the last week.  She sustained an injury to her left foot.  It is difficult for her to ambulate.  She has been wrapping her foot and ankle with an Ace wrap.  This helps some.  She is using a walker.  It is unclear how she lost her balance, but she thinks her forefoot essentially flexed underneath her ankle.  This caused immediate pain.  She had bruising and swelling to go with it.  Review of Systems: No fevers or chills No numbness or tingling No chest pain No shortness of breath No bowel or bladder dysfunction No GI distress No headaches    Objective: Ht '5\' 3"'$  (1.6 m)   Wt 215 lb (97.5 kg)   BMI 38.09 kg/m   Physical Exam:  General: Alert and oriented. and No acute distress. Gait: Slow, steady gait.  Left foot with diffuse swelling and bruising.  Bruising is  improving.  She has tenderness to palpation over the medial midfoot, as well as over the lateral hindfoot.  Difficult to localize the pain.  Sensation is intact over the dorsum of the foot.  Toes are warm and well-perfused.   IMAGING: I personally reviewed images previously obtained in clinic  X-rays of the left foot were obtained in clinic today.  These were compared to outside images.  No acute injuries are noted.  Possible remote history of avulsion fracture over the medial ankle.  Diffuse degenerative changes throughout the midfoot, mild to moderate overall.  Impression: Left foot x-rays negative for acute injury.   New Medications:  No orders of the defined types were placed in this encounter.     Mordecai Rasmussen, MD  11/02/2021 4:02 PM

## 2021-11-03 DIAGNOSIS — E559 Vitamin D deficiency, unspecified: Secondary | ICD-10-CM | POA: Diagnosis not present

## 2021-11-03 DIAGNOSIS — I129 Hypertensive chronic kidney disease with stage 1 through stage 4 chronic kidney disease, or unspecified chronic kidney disease: Secondary | ICD-10-CM | POA: Diagnosis not present

## 2021-11-03 DIAGNOSIS — E211 Secondary hyperparathyroidism, not elsewhere classified: Secondary | ICD-10-CM | POA: Diagnosis not present

## 2021-11-03 DIAGNOSIS — E1122 Type 2 diabetes mellitus with diabetic chronic kidney disease: Secondary | ICD-10-CM | POA: Diagnosis not present

## 2021-11-03 DIAGNOSIS — N189 Chronic kidney disease, unspecified: Secondary | ICD-10-CM | POA: Diagnosis not present

## 2021-11-09 DIAGNOSIS — E211 Secondary hyperparathyroidism, not elsewhere classified: Secondary | ICD-10-CM | POA: Diagnosis not present

## 2021-11-09 DIAGNOSIS — R809 Proteinuria, unspecified: Secondary | ICD-10-CM | POA: Diagnosis not present

## 2021-11-09 DIAGNOSIS — E1129 Type 2 diabetes mellitus with other diabetic kidney complication: Secondary | ICD-10-CM | POA: Diagnosis not present

## 2021-11-09 DIAGNOSIS — E1122 Type 2 diabetes mellitus with diabetic chronic kidney disease: Secondary | ICD-10-CM | POA: Diagnosis not present

## 2021-11-09 DIAGNOSIS — R0683 Snoring: Secondary | ICD-10-CM | POA: Diagnosis not present

## 2021-11-09 DIAGNOSIS — I129 Hypertensive chronic kidney disease with stage 1 through stage 4 chronic kidney disease, or unspecified chronic kidney disease: Secondary | ICD-10-CM | POA: Diagnosis not present

## 2021-11-09 DIAGNOSIS — N189 Chronic kidney disease, unspecified: Secondary | ICD-10-CM | POA: Diagnosis not present

## 2021-11-09 DIAGNOSIS — R718 Other abnormality of red blood cells: Secondary | ICD-10-CM | POA: Diagnosis not present

## 2021-11-15 ENCOUNTER — Encounter: Payer: Self-pay | Admitting: Orthopedic Surgery

## 2021-11-15 ENCOUNTER — Ambulatory Visit (INDEPENDENT_AMBULATORY_CARE_PROVIDER_SITE_OTHER): Payer: Medicare Other | Admitting: Orthopedic Surgery

## 2021-11-15 ENCOUNTER — Ambulatory Visit: Payer: Medicare Other | Admitting: Internal Medicine

## 2021-11-15 DIAGNOSIS — M79672 Pain in left foot: Secondary | ICD-10-CM | POA: Diagnosis not present

## 2021-11-15 NOTE — Progress Notes (Signed)
Return patient Visit  Assessment: [name] is a 76 y.o. female with the following: 1. Pain in left foot  Plan: [name] has improved pain in the left foot.  She is no longer using the walking boot.  If the pain returns, she can wear the boot.  Encouraged her to continue using the walker to assist with ambulation.  Follow-up as needed.   Follow-up: Return if symptoms worsen or fail to improve.  Subjective:  Chief Complaint  Patient presents with   Foot Pain    Left     History of Present Illness: [name] is a 76 y.o. female who returns to clinic for repeat evaluation of left foot pain.  I saw her 2 weeks ago in clinic, and she started wearing a walking boot.  Her pain is improved.  She stopped using the boot 3 days ago.  She continues to use a walker to assist with ambulation.  She notes swelling, but this improves overnight.   Review of Systems: No fevers or chills No numbness or tingling No chest pain No shortness of breath No bowel or bladder dysfunction No GI distress No headaches    Objective: There were no vitals taken for this visit.  Physical Exam:  General: Alert and oriented. and No acute distress. Gait: Slow, steady gait.  Left foot with no bruising.  No swelling.  She tolerates palpation of the midfoot area.  Toes are warm and well-perfused.  IMAGING: I personally reviewed images previously obtained in clinic  No new imaging obtained today.   New Medications:  No orders of the defined types were placed in this encounter.     Mordecai Rasmussen, MD  11/15/2021 4:21 PM

## 2021-11-27 ENCOUNTER — Other Ambulatory Visit: Payer: Self-pay | Admitting: Cardiology

## 2021-11-27 ENCOUNTER — Other Ambulatory Visit: Payer: Self-pay | Admitting: Internal Medicine

## 2021-11-27 DIAGNOSIS — F331 Major depressive disorder, recurrent, moderate: Secondary | ICD-10-CM

## 2021-12-06 ENCOUNTER — Ambulatory Visit: Payer: Medicare Other | Admitting: Internal Medicine

## 2021-12-13 ENCOUNTER — Ambulatory Visit (INDEPENDENT_AMBULATORY_CARE_PROVIDER_SITE_OTHER): Payer: Medicare Other | Admitting: Internal Medicine

## 2021-12-13 ENCOUNTER — Encounter: Payer: Self-pay | Admitting: Internal Medicine

## 2021-12-13 VITALS — BP 142/82 | HR 53 | Resp 18 | Ht 63.0 in | Wt 211.6 lb

## 2021-12-13 DIAGNOSIS — J449 Chronic obstructive pulmonary disease, unspecified: Secondary | ICD-10-CM

## 2021-12-13 DIAGNOSIS — E1122 Type 2 diabetes mellitus with diabetic chronic kidney disease: Secondary | ICD-10-CM | POA: Diagnosis not present

## 2021-12-13 DIAGNOSIS — I10 Essential (primary) hypertension: Secondary | ICD-10-CM

## 2021-12-13 DIAGNOSIS — E039 Hypothyroidism, unspecified: Secondary | ICD-10-CM | POA: Diagnosis not present

## 2021-12-13 DIAGNOSIS — N1831 Chronic kidney disease, stage 3a: Secondary | ICD-10-CM

## 2021-12-13 DIAGNOSIS — F411 Generalized anxiety disorder: Secondary | ICD-10-CM

## 2021-12-13 DIAGNOSIS — F17210 Nicotine dependence, cigarettes, uncomplicated: Secondary | ICD-10-CM

## 2021-12-13 DIAGNOSIS — Z720 Tobacco use: Secondary | ICD-10-CM

## 2021-12-13 DIAGNOSIS — N1832 Chronic kidney disease, stage 3b: Secondary | ICD-10-CM | POA: Diagnosis not present

## 2021-12-13 DIAGNOSIS — F331 Major depressive disorder, recurrent, moderate: Secondary | ICD-10-CM

## 2021-12-13 MED ORDER — CITALOPRAM HYDROBROMIDE 10 MG PO TABS: 10.0000 mg | ORAL_TABLET | Freq: Every day | ORAL | 1 refills | Status: DC

## 2021-12-13 NOTE — Assessment & Plan Note (Signed)
Lab Results  Component Value Date   TSH 3.450 09/12/2021   On Levothyroxine 50 mcg recently Check TSH and free T4, adjust dose accordingly 

## 2021-12-13 NOTE — Assessment & Plan Note (Signed)
  Lab Results  Component Value Date   HGBA1C 5.9 (H) 09/12/2021    Diet controlled Avoid tighter control in her case due to risk of hypoglycemia related fall

## 2021-12-13 NOTE — Assessment & Plan Note (Signed)
Smokes about 1 pack/week  Asked about quitting: confirms that he/she currently smokes cigarettes Advise to quit smoking: Educated about QUITTING to reduce the risk of cancer, cardio and cerebrovascular disease. Assess willingness: Unwilling to quit at this time, but is working on cutting back. Assist with counseling and pharmacotherapy: Counseled for 5 minutes and literature provided. Arrange for follow up: follow up in 3 months and continue to offer help. 

## 2021-12-13 NOTE — Assessment & Plan Note (Signed)
Well-controlled with Stiolto, Flovent and PRN Albuterol

## 2021-12-13 NOTE — Assessment & Plan Note (Signed)
BP Readings from Last 1 Encounters:  12/13/21 (!) 142/82   Overall well-controlled considering her age 76 for compliance with the medications Advised DASH diet

## 2021-12-13 NOTE — Patient Instructions (Signed)
Please take Citalopram as prescribed.  Please continue taking other medications as prescribed.  Please eat at regular intervals and take about 50 ounces of fluid in a day.

## 2021-12-13 NOTE — Progress Notes (Signed)
Established Patient Office Visit  Subjective:  Patient ID: [name], female    DOB: 17-Aug-1945  Age: 76 y.o. MRN: [id number]  CC:  Chief Complaint  Patient presents with   COPD   Depression    HPI [name] is a 76 y.o. female with past medical history of hypertension, hyperlipidemia, mesenteric ischemia status post right hemicolectomy, coronary artery disease, hypothyroidism, type 2 diabetes mellitus, depression with anxiety, obesity, arthritis, chronic back pain who presents for f/u of her chronic medical conditions.  HTN: Her BP is borderline elevated today. Her BP was running low at at home recently, around 90s over 60s and was told to stop taking amlodipine and valsartan. She still complains of mild headache and nausea.  Denies any dizziness, chest pain or palpitations.  MDD and GAD: She has anhedonia, insomnia, lack of interest in routine activities and agitation. Denies any SI or HI. She had stopped taking Zoloft as it was not helping. In the last visit, she was placed on Celexa, but she has not refilled it. She takes Xanax for anxiety and takes Ambien PRN for insomnia.  COPD: She has been using Flovent and as needed albuterol.  She uses home O2 at nighttime for dyspnea and hypoxia (O2 sat less than 88%).       Past Medical History:  Diagnosis Date   Acute ischemic colitis ([place]) 09/11/2005   Anxiety    Arthritis    Atherosclerotic vascular disease    Calcified plaque at the origin of the celiac and SMA   CHF (congestive heart failure) (HCC)    Coronary atherosclerosis of native coronary artery    Mulitvessel LVEF 50-50%, DES circ 1/11, occluded SVG to OM and SVG to diagonal , LVEF 60%   COVID-19 virus infection 11/07/2020   Depression    Diverticulosis of colon    Essential hypertension    Hypothyroidism    Mesenteric ischemia (HCC)    Mitral regurgitation    Mild to moderate   Mixed hyperlipidemia    Myocardial infarction ([place]) 1990   Obesity     Orthostatic hypotension    PAD (peripheral artery disease) (HCC)    PVC's (premature ventricular contractions)    Schizophrenia (HCC)    Sleep apnea    CPAP   TIA (transient ischemic attack) 11/20/2017   Tubular adenoma    Type 2 diabetes mellitus (HCC)    Vascular complications of mesenteric artery     Past Surgical History:  Procedure Laterality Date   BACK SURGERY     Lumbar spine surgery   Carotid endarectomy Bilateral    CARPAL TUNNEL RELEASE     Bilateral   CATARACT EXTRACTION W/PHACO Right 08/24/2013   Procedure: CATARACT EXTRACTION PHACO AND INTRAOCULAR LENS PLACEMENT ([place]);  Surgeon: Tonny Branch, MD;  Location: [facility];  Service: Ophthalmology;  Laterality: Right;  CDE 8.68   COLONOSCOPY  09/14/2005   Dr. Leonard Schwartz colitis   COLONOSCOPY WITH ESOPHAGOGASTRODUODENOSCOPY (EGD)  04/14/2012   Dr. Gala Romney- EGD= gastric erosions of doubtful clinical significance per bx- chronic inactive gastritis, benign small bowel mucosa. TCS=colonic diverticulosis, tubular adenoma   CORONARY ARTERY BYPASS GRAFT     LIMA-LAD; SVG-OM; SVG-DX in [place]     Cervical laminectomy   PARTIAL HYSTERECTOMY     RIGHT/LEFT HEART CATH AND CORONARY/GRAFT ANGIOGRAPHY N/A 04/12/2021   Procedure: RIGHT/LEFT HEART CATH AND CORONARY/GRAFT ANGIOGRAPHY;  Surgeon: Wellington Hampshire, MD;  Location: [facility];  Service: Cardiovascular;  Laterality: N/A;   stents x4      Family History  Problem Relation Age of Onset   Alcohol abuse Mother    Alcohol abuse Father    Coronary artery disease Other    Hypertension Other    Crohn's disease Sister 60   Stroke Daughter     Social History   Socioeconomic History   Marital status: Widowed    Spouse name: Not on file   Number of children: 4   Years of education: Not on file   Highest education level: Not on file  Occupational History   Occupation: DISABLED    Employer: UNEMPLOYED  Tobacco Use   Smoking status: Light Smoker     Packs/day: 0.50    Years: 50.00    Total pack years: 25.00    Types: Cigarettes    Start date: 12/30/2018   Smokeless tobacco: Never  Vaping Use   Vaping Use: Never used  Substance and Sexual Activity   Alcohol use: Yes    Alcohol/week: 0.0 standard drinks of alcohol    Comment: occasionally    Drug use: No   Sexual activity: Not on file  Other Topics Concern   Not on file  Social History Narrative   [place] alone   Social Determinants of Health   Financial Resource Strain: Low Risk  (02/27/2021)   Overall Financial Resource Strain (CARDIA)    Difficulty of Paying Living Expenses: Not hard at all  Food Insecurity: No Food Insecurity (02/27/2021)   Hunger Vital Sign    Worried About Running Out of Food in the Last Year: Never true    Ran Out of Food in the Last Year: Never true  Transportation Needs: No Transportation Needs (02/27/2021)   PRAPARE - Hydrologist (Medical): No    Lack of Transportation (Non-Medical): No  Physical Activity: Inactive (02/27/2021)   Exercise Vital Sign    Days of Exercise per Week: 0 days    Minutes of Exercise per Session: 0 min  Stress: No Stress Concern Present (02/27/2021)   [place]    Feeling of Stress : Only a little  Social Connections: Socially Isolated (02/27/2021)   Social Connection and Isolation Panel [NHANES]    Frequency of Communication with Friends and Family: More than three times a week    Frequency of Social Gatherings with Friends and Family: Never    Attends Religious Services: Never    Marine scientist or Organizations: No    Attends Archivist Meetings: Never    Marital Status: Widowed  Intimate Partner Violence: Not At Risk (02/27/2021)   Humiliation, Afraid, Rape, and Kick questionnaire    Fear of Current or Ex-Partner: No    Emotionally Abused: No    Physically Abused: No    Sexually Abused: No     Outpatient Medications Prior to Visit  Medication Sig Dispense Refill   albuterol (VENTOLIN HFA) 108 (90 Base) MCG/ACT inhaler USE 2 INHALATIONS BY MOUTH  EVERY 6 HOURS AS NEEDED FOR WHEEZING OR SHORTNESS OF  BREATH (Patient taking differently: 1 puff every 6 (six) hours as needed for shortness of breath or wheezing.) 18 g 0   ALPRAZolam (XANAX) 0.5 MG tablet TAKE 1 TABLET BY MOUTH TWICE DAILY AS NEEDED FOR ANXIETY 60 tablet 3   aspirin EC 81 MG tablet Take 81 mg by mouth at bedtime.     Biotin 10000 MCG TABS Take 1  tablet by mouth daily.     blood glucose meter kit and supplies Dispense based on patient and insurance preference. Use up to four times daily as directed. (FOR ICD-10 E10.9, E11.9). 1 each 0   calcitRIOL (ROCALTROL) 0.25 MCG capsule Take 0.25 mcg by mouth 2 (two) times a week.     Cholecalciferol (VITAMIN D3) 50 MCG (2000 UT) TABS Take 1 tablet by mouth daily.     clopidogrel (PLAVIX) 75 MG tablet Take 1 tablet (75 mg total) by mouth daily. 90 tablet 3   Collagen-Vitamin C (COLLAGEN PLUS VITAMIN C PO) Take 1 tablet by mouth daily.     docusate sodium (COLACE) 100 MG capsule Take 100 mg by mouth as needed for mild constipation.     famotidine (PEPCID) 40 MG tablet Take 1 tablet (40 mg total) by mouth daily as needed for heartburn or indigestion. 30 tablet 5   FIBER COMPLETE PO Take 2 tablets by mouth daily. Fiber Well gummies     fluticasone (FLOVENT HFA) 110 MCG/ACT inhaler Inhale 1 puff into the lungs in the morning and at bedtime. (Patient taking differently: Inhale 2 puffs into the lungs in the morning and at bedtime.) 1 each 12   isosorbide mononitrate (IMDUR) 60 MG 24 hr tablet TAKE ONE (1) TABLET EACH DAY 90 tablet 1   levothyroxine (SYNTHROID) 50 MCG tablet Take 1 tablet (50 mcg total) by mouth daily before breakfast. 30 tablet 11   Melatonin 10 MG TABS Take 10 mg by mouth at bedtime as needed (sleep).     metoprolol tartrate (LOPRESSOR) 50 MG tablet TAKE ONE (1) TABLET BY  MOUTH TWO (2) TIMES DAILY 180 tablet 1   nitroGLYCERIN (NITROSTAT) 0.4 MG SL tablet Place 1 tablet (0.4 mg total) under the tongue every 5 (five) minutes as needed. (Patient taking differently: Place 0.4 mg under the tongue every 5 (five) minutes as needed for chest pain.) 90 tablet 3   ondansetron (ZOFRAN ODT) 4 MG disintegrating tablet Take 1 tablet (4 mg total) by mouth every 8 (eight) hours as needed for nausea or vomiting. 20 tablet 0   OneTouch Delica Lancets 87G MISC 4 TIMES DAILY 100 each 5   ONETOUCH VERIO test strip 4 TIMES DAILY 100 each 5   pantoprazole (PROTONIX) 40 MG tablet TAKE ONE TABLET BY MOUTH EVERY MORNING 90 tablet 1   pravastatin (PRAVACHOL) 80 MG tablet Take 1 tablet (80 mg total) by mouth daily. 90 tablet 3   torsemide (DEMADEX) 20 MG tablet Take 2 tablets (40 mg total) by mouth daily. (Patient taking differently: Take 40 mg by mouth daily. Takes occasionally - 04/17/2021) 180 tablet 2   valsartan (DIOVAN) 40 MG tablet Take 40 mg by mouth daily.     zolpidem (AMBIEN) 10 MG tablet TAKE 1 TABLET BY MOUTH AT BEDTIME AS NEEDED FOR SLEEP 30 tablet 2   citalopram (CELEXA) 10 MG tablet TAKE ONE (1) TABLET BY MOUTH EVERY DAY 30 tablet 2   Facility-Administered Medications Prior to Visit  Medication Dose Route Frequency Provider Last Rate Last Admin   sodium chloride flush (NS) 0.9 % injection 3 mL  3 mL Intravenous Q12H Satira Sark, MD        No Known Allergies  ROS Review of Systems  Constitutional:  Negative for chills and fever.  HENT:  Negative for congestion, sinus pressure and sinus pain.   Eyes:  Negative for pain and discharge.  Respiratory:  Negative for cough and shortness of breath.  Cardiovascular:  Negative for chest pain and palpitations.  Gastrointestinal:  Positive for abdominal pain and nausea. Negative for diarrhea and vomiting.  Genitourinary:  Negative for dysuria and hematuria.  Musculoskeletal:  Positive for arthralgias, back pain, gait  problem and joint swelling.  Skin:        Bruising over UE and LE  Neurological:  Positive for weakness and headaches.  Psychiatric/Behavioral:  Positive for agitation and sleep disturbance. Negative for behavioral problems. The patient is nervous/anxious.       Objective:    Physical Exam Vitals reviewed.  Constitutional:      General: She is not in acute distress.    Appearance: She is obese. She is not diaphoretic.  HENT:     Head: Normocephalic and atraumatic.     Nose: Nose normal. No congestion.     Mouth/Throat:     Mouth: Mucous membranes are moist.     Pharynx: No posterior oropharyngeal erythema.  Eyes:     General: No scleral icterus.    Extraocular Movements: Extraocular movements intact.  Cardiovascular:     Rate and Rhythm: Normal rate and regular rhythm.     Pulses: Normal pulses.     Heart sounds: Normal heart sounds. No murmur heard. Pulmonary:     Breath sounds: Normal breath sounds. No wheezing or rales.  Abdominal:     Palpations: Abdomen is soft.     Tenderness: There is no abdominal tenderness.  Musculoskeletal:        General: Swelling (Around right great toe with erythema) present.     Cervical back: Normal range of motion and neck supple. No rigidity or tenderness.     Right lower leg: Edema (1+) present.     Left lower leg: Edema (1+) present.  Skin:    General: Skin is warm.     Findings: Bruising (Over b/l UE and LE) present.  Neurological:     General: No focal deficit present.     Mental Status: She is alert and oriented to person, place, and time.     Sensory: No sensory deficit.     Motor: No weakness.     Gait: Gait abnormal (Likely in the setting of baseline hip pain).  Psychiatric:        Mood and Affect: Mood normal.        Behavior: Behavior normal.     BP (!) 142/82 (BP Location: Left Arm, Cuff Size: Normal)   Pulse (!) 53   Resp 18   Ht 5' 3" (1.6 m)   Wt 211 lb 9.6 oz (96 kg)   SpO2 95%   BMI 37.48 kg/m  Wt Readings  from Last 3 Encounters:  12/13/21 211 lb 9.6 oz (96 kg)  11/01/21 215 lb (97.5 kg)  09/19/21 215 lb (97.5 kg)    Lab Results  Component Value Date   TSH 3.450 09/12/2021   Lab Results  Component Value Date   WBC 6.9 09/12/2021   HGB 13.7 09/12/2021   HCT 41.2 09/12/2021   MCV 95 09/12/2021   PLT 269 09/12/2021   Lab Results  Component Value Date   NA 139 09/12/2021   K 5.9 (H) 09/12/2021   CO2 25 09/12/2021   GLUCOSE 105 (H) 09/12/2021   BUN 16 09/12/2021   CREATININE 1.27 (H) 09/12/2021   BILITOT 0.7 09/12/2021   ALKPHOS 114 09/12/2021   AST 30 09/12/2021   ALT 13 09/12/2021   PROT 7.6 09/12/2021   ALBUMIN 4.4 09/12/2021  CALCIUM 10.2 09/12/2021   ANIONGAP 9 03/13/2021   EGFR 44 (L) 09/12/2021   Lab Results  Component Value Date   CHOL 137 09/12/2021   Lab Results  Component Value Date   HDL 46 09/12/2021   Lab Results  Component Value Date   LDLCALC 68 09/12/2021   Lab Results  Component Value Date   TRIG 128 09/12/2021   Lab Results  Component Value Date   CHOLHDL 3.0 09/12/2021   Lab Results  Component Value Date   HGBA1C 5.9 (H) 09/12/2021      Assessment & Plan:   Problem List Items Addressed This Visit       Cardiovascular and Mediastinum   Benign essential hypertension    BP Readings from Last 1 Encounters:  12/13/21 (!) 142/82  Overall well-controlled considering her age 40 for compliance with the medications Advised DASH diet        Respiratory   COPD (chronic obstructive pulmonary disease) ([place])    Well-controlled with Stiolto, Flovent and PRN Albuterol        Endocrine   DM2 (diabetes mellitus, type 2) ([place]) - Primary     Lab Results  Component Value Date   HGBA1C 5.9 (H) 09/12/2021    Diet controlled Avoid tighter control in her case due to risk of hypoglycemia related fall      Hypothyroidism    Lab Results  Component Value Date   TSH 3.450 09/12/2021  On Levothyroxine 50 mcg recently Check TSH  and free T4, adjust dose accordingly        Genitourinary   Stage 3 chronic kidney disease ([place])    Likely due to HTN and DM On Torsemide for HFrEF Avoid nephrotoxic agents including NSAIDs Followed by Dr. Theador Hawthorne - last BMP and visit note reviewed        Other   Tobacco abuse    Smokes about 1 pack/week  Asked about quitting: confirms that he/she currently smokes cigarettes Advise to quit smoking: Educated about QUITTING to reduce the risk of cancer, cardio and cerebrovascular disease. Assess willingness: Unwilling to quit at this time, but is working on cutting back. Assist with counseling and pharmacotherapy: Counseled for 5 minutes and literature provided. Arrange for follow up: follow up in 3 months and continue to offer help.      MDD (major depressive disorder), recurrent episode, moderate ([place])    [place] Office Visit from 12/13/2021 in [facility]  PHQ-9 Total Score 16  Uncontrolled Had started Zoloft 50 mg daily, but she stopped taking it as it was not helping her Switched to Celexa, needs to be compliant Takes Ambien as needed for insomnia      Relevant Medications   citalopram (CELEXA) 10 MG tablet   GAD (generalized anxiety disorder)    Takes Xanax, sometimes twice daily - still has anhedonia and spells of anxiety Added Celexa 10 mg QD for MDD and anxiety Patient also takes Ambien for insomnia. She lives alone and could be a reason for her anxiety as well      Relevant Medications   citalopram (CELEXA) 10 MG tablet   Morbid obesity ([place])    BMI Readings from Last 3 Encounters:  12/13/21 37.48 kg/m  11/01/21 38.09 kg/m  09/19/21 38.09 kg/m  Associated with HTN, CAD, CKD and HLD Diet modification and moderate exercise/walking advised       Meds ordered this encounter  Medications   citalopram (CELEXA) 10 MG tablet    Sig: Take  1 tablet (10 mg total) by mouth daily.    Dispense:  90 tablet    Refill:  1    Follow-up: Return  in about 4 months (around 04/14/2022) for MDD and DM.    Lindell Spar, MD

## 2021-12-13 NOTE — Assessment & Plan Note (Signed)
Likely due to HTN and DM On Torsemide for HFrEF Avoid nephrotoxic agents including NSAIDs Followed by Dr. Bhutani - last BMP and visit note reviewed 

## 2021-12-13 NOTE — Assessment & Plan Note (Signed)
[place] Office Visit from 12/13/2021 in [facility]  PHQ-9 Total Score 16     Uncontrolled Had started Zoloft 50 mg daily, but she stopped taking it as it was not helping her Switched to Celexa, needs to be compliant Takes Ambien as needed for insomnia

## 2021-12-13 NOTE — Assessment & Plan Note (Signed)
Takes Xanax, sometimes twice daily - still has anhedonia and spells of anxiety Added Celexa 10 mg QD for MDD and anxiety Patient also takes Ambien for insomnia. She lives alone and could be a reason for her anxiety as well

## 2021-12-13 NOTE — Assessment & Plan Note (Addendum)
BMI Readings from Last 3 Encounters:  12/13/21 37.48 kg/m  11/01/21 38.09 kg/m  09/19/21 38.09 kg/m   Associated with HTN, CAD, CKD and HLD Diet modification and moderate exercise/walking advised

## 2021-12-20 ENCOUNTER — Encounter: Payer: Self-pay | Admitting: Cardiology

## 2021-12-20 ENCOUNTER — Ambulatory Visit: Payer: Medicare Other | Admitting: Cardiology

## 2021-12-20 VITALS — BP 98/58 | HR 56 | Ht 63.0 in | Wt 218.4 lb

## 2021-12-20 DIAGNOSIS — I10 Essential (primary) hypertension: Secondary | ICD-10-CM | POA: Diagnosis not present

## 2021-12-20 DIAGNOSIS — E782 Mixed hyperlipidemia: Secondary | ICD-10-CM

## 2021-12-20 DIAGNOSIS — I25119 Atherosclerotic heart disease of native coronary artery with unspecified angina pectoris: Secondary | ICD-10-CM

## 2021-12-20 MED ORDER — METOPROLOL TARTRATE 25 MG PO TABS: 25.0000 mg | ORAL_TABLET | Freq: Two times a day (BID) | ORAL | 1 refills | Status: DC

## 2021-12-20 NOTE — Progress Notes (Signed)
Cardiology Office Note  Date: 12/20/2021   ID: Ahliya, Glatt [birth year], MRN [id number]  PCP:  Lindell Spar, MD  Cardiologist:  Rozann Lesches, MD Electrophysiologist:  None   Chief Complaint  Patient presents with   Cardiac follow-up    History of Present Illness: [name] is a 76 y.o. female last seen in February.  She is here for a follow-up visit.  Reports no progressive angina symptoms, but states that she feels chronically weak and tired.  She has had no syncope.  I reviewed her medications which are noted below.  We discussed reducing Lopressor to 25 mg twice daily for now to see if this makes any difference in her fatigue.  I did review her interval lab work obtained by PCP.  Last LDL was 68 on Pravachol.  Past Medical History:  Diagnosis Date   Acute ischemic colitis ([place]) 09/11/2005   Anxiety    Arthritis    Atherosclerotic vascular disease    Calcified plaque at the origin of the celiac and SMA   CHF (congestive heart failure) (HCC)    Coronary atherosclerosis of native coronary artery    Mulitvessel LVEF 50-50%, DES circ 1/11, occluded SVG to OM and SVG to diagonal , LVEF 60%   COVID-19 virus infection 11/07/2020   Depression    Diverticulosis of colon    Essential hypertension    Hypothyroidism    Mesenteric ischemia (HCC)    Mitral regurgitation    Mild to moderate   Mixed hyperlipidemia    Myocardial infarction ([place]) 1990   Obesity    Orthostatic hypotension    PAD (peripheral artery disease) (HCC)    PVC's (premature ventricular contractions)    Schizophrenia (HCC)    Sleep apnea    CPAP   TIA (transient ischemic attack) 11/20/2017   Tubular adenoma    Type 2 diabetes mellitus (HCC)    Vascular complications of mesenteric artery     Past Surgical History:  Procedure Laterality Date   BACK SURGERY     Lumbar spine surgery   Carotid endarectomy Bilateral    CARPAL TUNNEL RELEASE     Bilateral   CATARACT EXTRACTION  W/PHACO Right 08/24/2013   Procedure: CATARACT EXTRACTION PHACO AND INTRAOCULAR LENS PLACEMENT ([place]);  Surgeon: Tonny Branch, MD;  Location: [facility];  Service: Ophthalmology;  Laterality: Right;  CDE 8.68   COLONOSCOPY  09/14/2005   Dr. Leonard Schwartz colitis   COLONOSCOPY WITH ESOPHAGOGASTRODUODENOSCOPY (EGD)  04/14/2012   Dr. Gala Romney- EGD= gastric erosions of doubtful clinical significance per bx- chronic inactive gastritis, benign small bowel mucosa. TCS=colonic diverticulosis, tubular adenoma   CORONARY ARTERY BYPASS GRAFT     LIMA-LAD; SVG-OM; SVG-DX in [place]     Cervical laminectomy   PARTIAL HYSTERECTOMY     RIGHT/LEFT HEART CATH AND CORONARY/GRAFT ANGIOGRAPHY N/A 04/12/2021   Procedure: RIGHT/LEFT HEART CATH AND CORONARY/GRAFT ANGIOGRAPHY;  Surgeon: Wellington Hampshire, MD;  Location: [facility];  Service: Cardiovascular;  Laterality: N/A;   stents x4      Current Outpatient Medications  Medication Sig Dispense Refill   albuterol (VENTOLIN HFA) 108 (90 Base) MCG/ACT inhaler Inhale 1 puff into the lungs every 6 (six) hours as needed for wheezing or shortness of breath.     ALPRAZolam (XANAX) 0.5 MG tablet TAKE 1 TABLET BY MOUTH TWICE DAILY AS NEEDED FOR ANXIETY 60 tablet 3   aspirin EC 81 MG tablet Take 81 mg by  mouth at bedtime.     Biotin 10000 MCG TABS Take 1 tablet by mouth daily.     blood glucose meter kit and supplies Dispense based on patient and insurance preference. Use up to four times daily as directed. (FOR ICD-10 E10.9, E11.9). 1 each 0   calcitRIOL (ROCALTROL) 0.25 MCG capsule Take 0.25 mcg by mouth 2 (two) times a week.     Cholecalciferol (VITAMIN D3) 50 MCG (2000 UT) TABS Take 1 tablet by mouth daily.     citalopram (CELEXA) 10 MG tablet Take 1 tablet (10 mg total) by mouth daily. 90 tablet 1   clopidogrel (PLAVIX) 75 MG tablet Take 1 tablet (75 mg total) by mouth daily. 90 tablet 3   Collagen-Vitamin C (COLLAGEN PLUS VITAMIN C PO) Take 1 tablet  by mouth daily.     docusate sodium (COLACE) 100 MG capsule Take 100 mg by mouth as needed for mild constipation.     famotidine (PEPCID) 40 MG tablet Take 1 tablet (40 mg total) by mouth daily as needed for heartburn or indigestion. 30 tablet 5   fluticasone (FLOVENT HFA) 110 MCG/ACT inhaler Inhale 2 puffs into the lungs 2 (two) times daily.     isosorbide mononitrate (IMDUR) 60 MG 24 hr tablet TAKE ONE (1) TABLET EACH DAY 90 tablet 1   levothyroxine (SYNTHROID) 50 MCG tablet Take 1 tablet (50 mcg total) by mouth daily before breakfast. 30 tablet 11   Melatonin 10 MG TABS Take 10 mg by mouth at bedtime as needed (sleep).     metoprolol tartrate (LOPRESSOR) 25 MG tablet Take 1 tablet (25 mg total) by mouth 2 (two) times daily. 180 tablet 1   nitroGLYCERIN (NITROSTAT) 0.4 MG SL tablet Place 0.4 mg under the tongue every 5 (five) minutes x 3 doses as needed for chest pain.     ondansetron (ZOFRAN ODT) 4 MG disintegrating tablet Take 1 tablet (4 mg total) by mouth every 8 (eight) hours as needed for nausea or vomiting. 20 tablet 0   OneTouch Delica Lancets 00B MISC 4 TIMES DAILY 100 each 5   ONETOUCH VERIO test strip 4 TIMES DAILY 100 each 5   pantoprazole (PROTONIX) 40 MG tablet TAKE ONE TABLET BY MOUTH EVERY MORNING 90 tablet 1   pravastatin (PRAVACHOL) 80 MG tablet Take 1 tablet (80 mg total) by mouth daily. 90 tablet 3   torsemide (DEMADEX) 20 MG tablet Take 40 mg by mouth as needed.     valsartan (DIOVAN) 40 MG tablet Take 80 mg by mouth daily.     zolpidem (AMBIEN) 10 MG tablet TAKE 1 TABLET BY MOUTH AT BEDTIME AS NEEDED FOR SLEEP 30 tablet 2   FIBER COMPLETE PO Take 2 tablets by mouth daily. Fiber Well gummies (Patient not taking: Reported on 12/20/2021)     Current Facility-Administered Medications  Medication Dose Route Frequency Provider Last Rate Last Admin   sodium chloride flush (NS) 0.9 % injection 3 mL  3 mL Intravenous Q12H Satira Sark, MD       Allergies:  Patient has no  known allergies.   ROS: Insomnia.  Physical Exam: VS:  BP (!) 98/58 (BP Location: Left Arm, Patient Position: Sitting, Cuff Size: Large)   Pulse (!) 56   Ht _0  (1.6 m)   Wt 218 lb 6.4 oz (99.1 kg)   SpO2 95%   BMI 38.69 kg/m , BMI Body mass index is 38.69 kg/m.  Wt Readings from Last 3 Encounters:  12/20/21 218  lb 6.4 oz (99.1 kg)  12/13/21 211 lb 9.6 oz (96 kg)  11/01/21 215 lb (97.5 kg)    General: Patient appears comfortable at rest. HEENT: Conjunctiva and lids normal. Neck: Supple, no elevated JVP or carotid bruits, no thyromegaly. Lungs: Clear to auscultation, nonlabored breathing at rest. Cardiac: Regular rate and rhythm with ectopy, no S3, 1/6 systolic murmur. Extremities: No pitting edema.  ECG:  An ECG dated 03/14/2021 was personally reviewed today and demonstrated:  Sinus rhythm with PAC and old anterior infarct pattern.  Recent Labwork: 09/12/2021: ALT 13; AST 30; BUN 16; Creatinine, Ser 1.27; Hemoglobin 13.7; Platelets 269; Potassium 5.9; Sodium 139; TSH 3.450     Component Value Date/Time   CHOL 137 09/12/2021 1529   TRIG 128 09/12/2021 1529   HDL 46 09/12/2021 1529   CHOLHDL 3.0 09/12/2021 1529   LDLCALC 68 09/12/2021 1529    Other Studies Reviewed Today:  Echocardiogram 06/14/2020:  1. Abnormal septal motion distal septal apical mid and basal inferior  wall hypokinesis . Left ventricular ejection fraction, by estimation, is  45 to 50%. The left ventricle has mildly decreased function. The left  ventricle has no regional wall motion  abnormalities. The left ventricular internal cavity size was mildly  dilated. There is moderate left ventricular hypertrophy. Left ventricular  diastolic parameters are consistent with Grade I diastolic dysfunction  (impaired relaxation).   2. Right ventricular systolic function is normal. The right ventricular  size is normal.   3. Left atrial size was mildly dilated.   4. The mitral valve is degenerative. Mild mitral  valve regurgitation. No  evidence of mitral stenosis.   5. The aortic valve is tricuspid. Aortic valve regurgitation is not  visualized. Mild aortic valve sclerosis is present, with no evidence of  aortic valve stenosis.   6. The inferior vena cava is normal in size with greater than 50%  respiratory variability, suggesting right atrial pressure of 3 mmHg.    Cardiac catheterization 04/12/2021:   Ost LM to Mid LM lesion is 40% stenosed.   1st Mrg lesion is 50% stenosed.   Prox Cx lesion is 50% stenosed.   Ost LAD to Prox LAD lesion is 90% stenosed.   Prox Cx to Mid Cx lesion is 99% stenosed.   Prox RCA to Mid RCA lesion is 100% stenosed.   Prox RCA lesion is 80% stenosed.   Origin lesion is 100% stenosed.   Origin to Prox Graft lesion is 100% stenosed.   LIMA graft was visualized by angiography and is normal in caliber.   1.  Severe underlying three-vessel coronary artery disease with patent LIMA to LAD.  Chronically occluded SVG to OM and SVG to diagonal.  The SVG to right PDA which was patent on most recent angiography in 2011 is now occluded.  Left circumflex stent from the proximal portion extending into OM1 is patent with moderate in-stent restenosis and subtotal occlusion of the AV groove left circumflex which was present before. 2.  Right heart catheterization showed high normal filling pressures, no pulmonary hypertension and normal cardiac output.   Recommendations: The SVG to right PDA is now occluded which is new but the RCA has good left-to-right collaterals.  The AV groove left circumflex was subtotally occluded before and jailed by previous stent.  This could not be crossed with a wire before. Recommend continuing medical therapy.  Assessment and Plan:  1.  Multivessel CAD status post CABG and DES to the circumflex.  Last cardiac catheterization in  December 2022 showed graft disease with no specific targets for PCI.  Plan is medical therapy and she does not describe any  progressive angina at this point.  We will reduce Lopressor to 25 mg twice daily in light of bradycardia and reported fatigue, otherwise continue aspirin, Plavix, Imdur, Norvasc, Diovan, and Pravachol.  2.  Mixed hyperlipidemia, doing well on Pravachol with last LDL 68.  3.  Essential hypertension, blood pressure low today.  Lopressor being reduced.  4.  HFmrEF, LVEF 45 to 50% range.  Continue with current medications.  Medication Adjustments/Labs and Tests Ordered: Current medicines are reviewed at length with the patient today.  Concerns regarding medicines are outlined above.   Tests Ordered: No orders of the defined types were placed in this encounter.   Medication Changes: Meds ordered this encounter  Medications   metoprolol tartrate (LOPRESSOR) 25 MG tablet    Sig: Take 1 tablet (25 mg total) by mouth 2 (two) times daily.    Dispense:  180 tablet    Refill:  1    12/20/2021 dose decrease    Disposition:  Follow up  6 months.  Signed, Satira Sark, MD, [facility] 12/20/2021 2:45 PM    [facility] [place], [place], [place] [address] Phone: [phone]; Fax: [phone]

## 2021-12-20 NOTE — Patient Instructions (Addendum)
Medication Instructions:  Your physician has recommended you make the following change in your medication:  Decrease metoprolol tartrate to 25 mg twice daily Continue other medications the same  Labwork: none  Testing/Procedures: none  Follow-Up: Your physician recommends that you schedule a follow-up appointment in: 6 months  Any Other Special Instructions Will Be Listed Below (If Applicable).  If you need a refill on your cardiac medications before your next appointment, please call your pharmacy.

## 2022-01-09 IMAGING — US US RENAL
1 series · 14 of 25 positions shown · non-contrast
Comparison: CT abdomen pelvis dated 09/29/2019.

CLINICAL DATA: Chronic kidney disease.

EXAM:
RENAL / URINARY TRACT ULTRASOUND COMPLETE

[Series 1: us renal · 0.21mm/px · 14 of 35 slices shown]
[im 1/35]
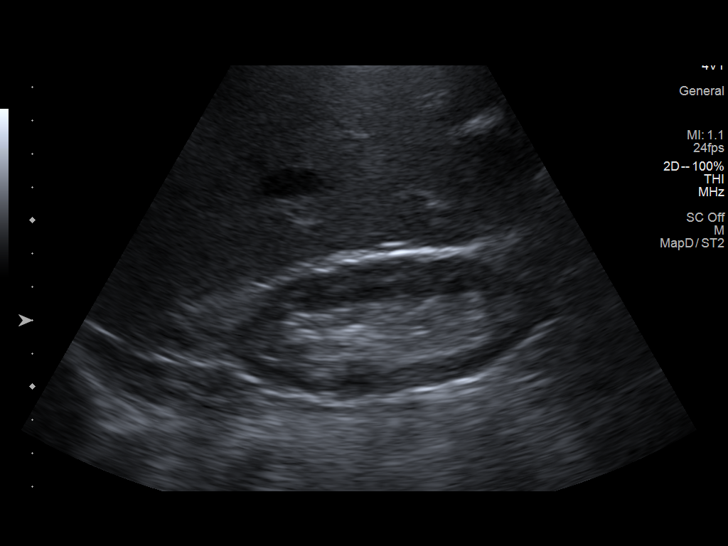
[im 3/35]
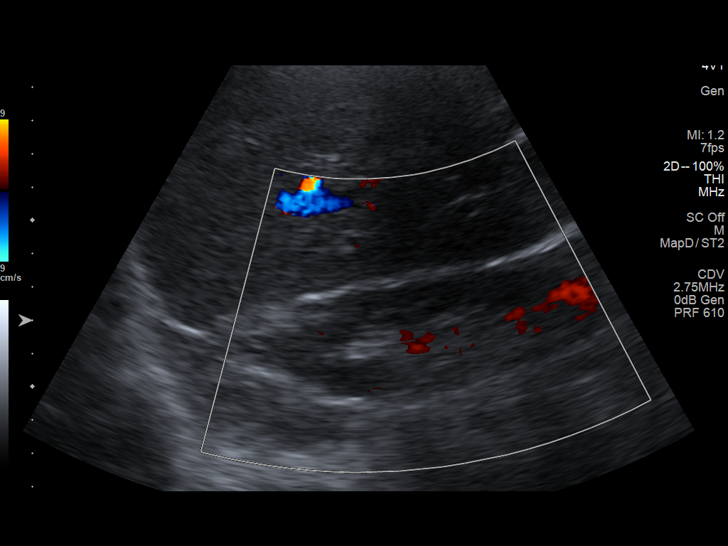
[im 6/35]
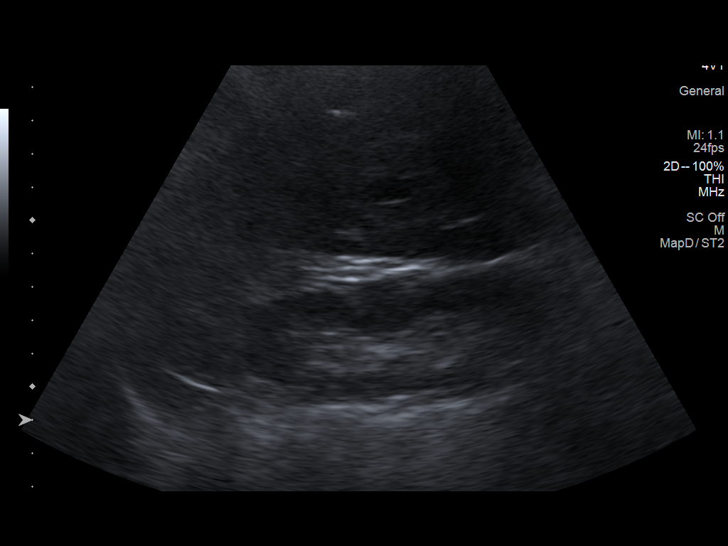
[im 9/35]
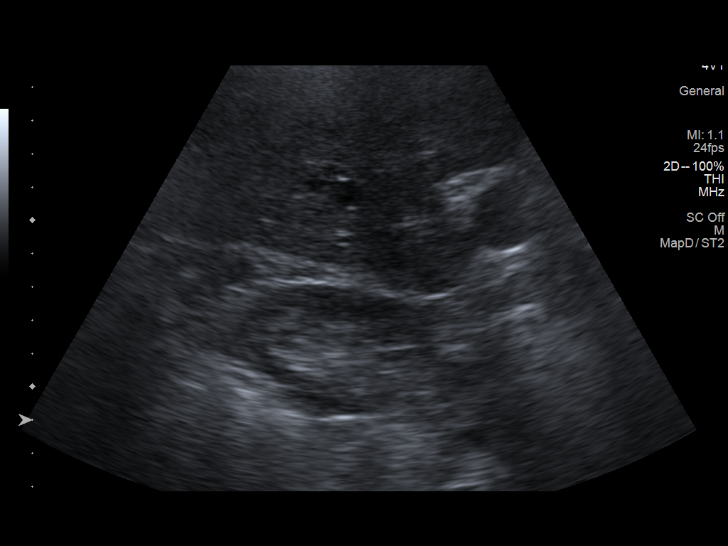
[im 12/35]
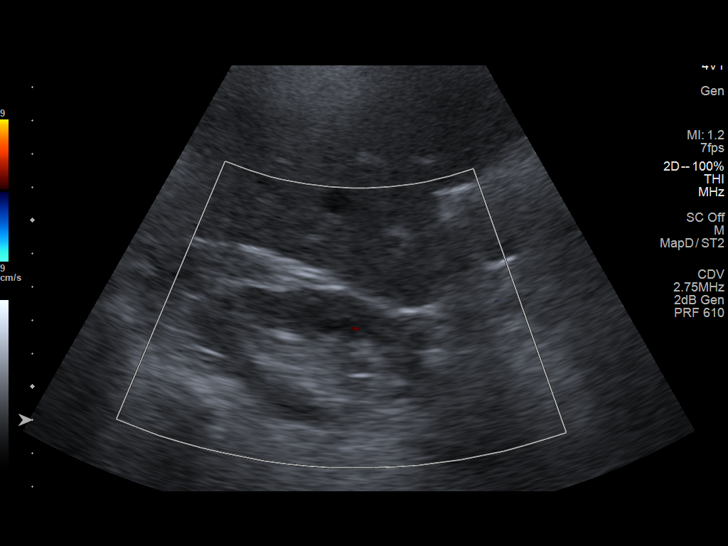
[im 13/35]
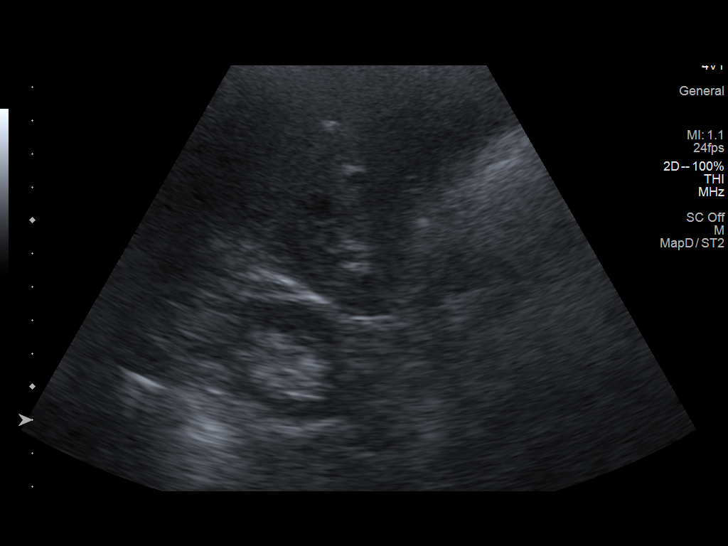
[im 16/35]
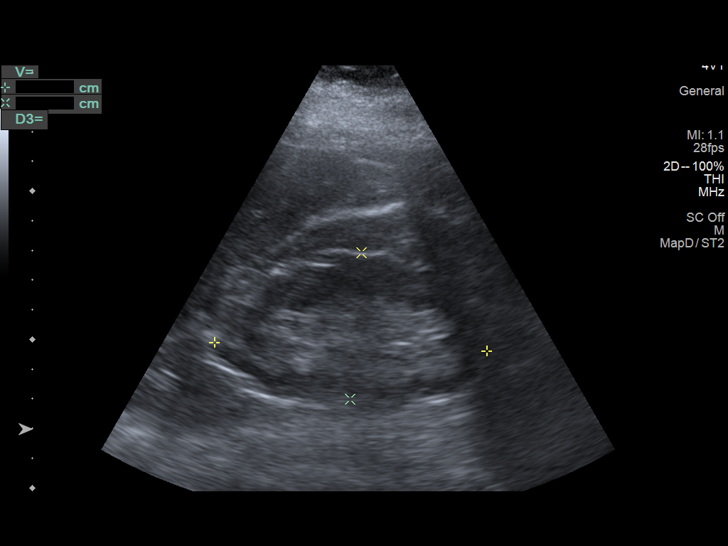
[im 19/35]
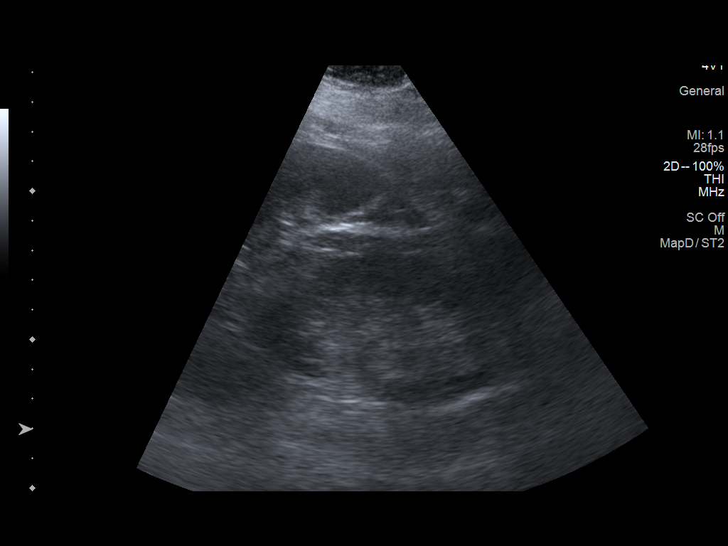
[im 22/35]
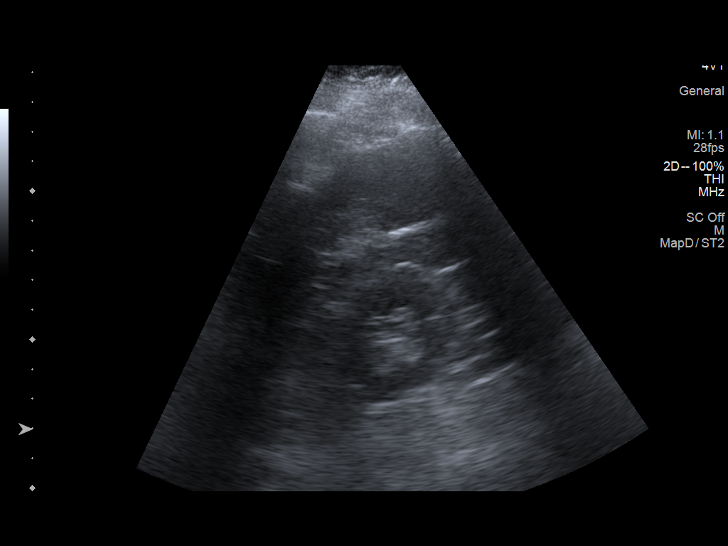
[im 23/35]
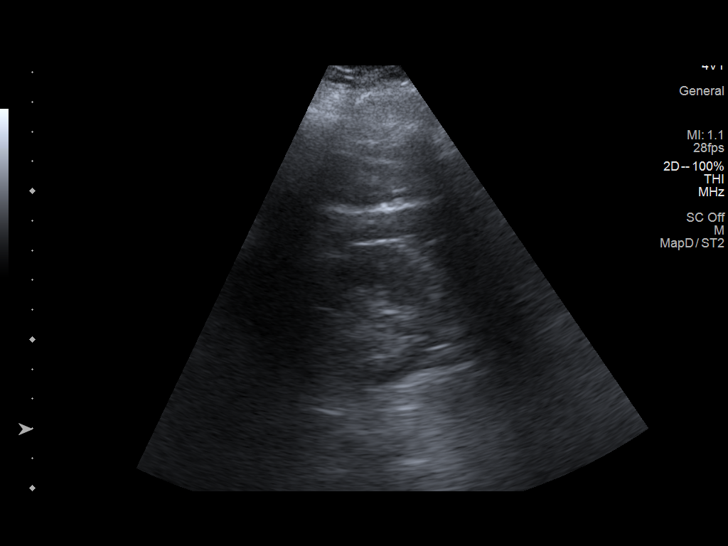
[im 26/35]
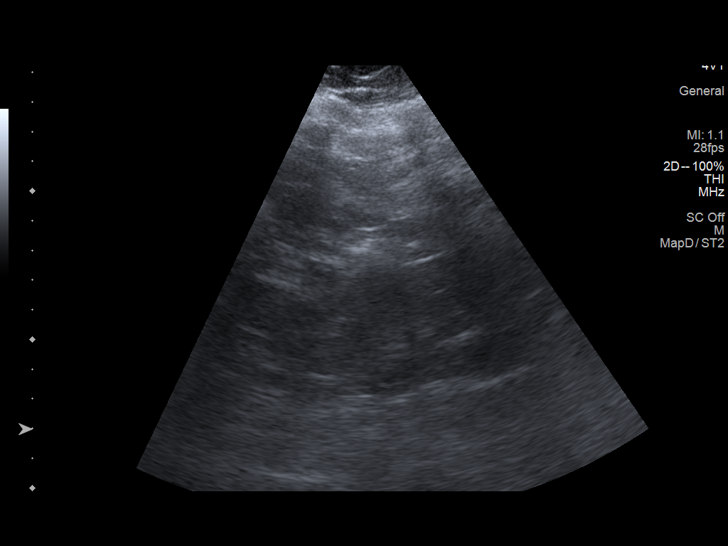
[im 29/35]
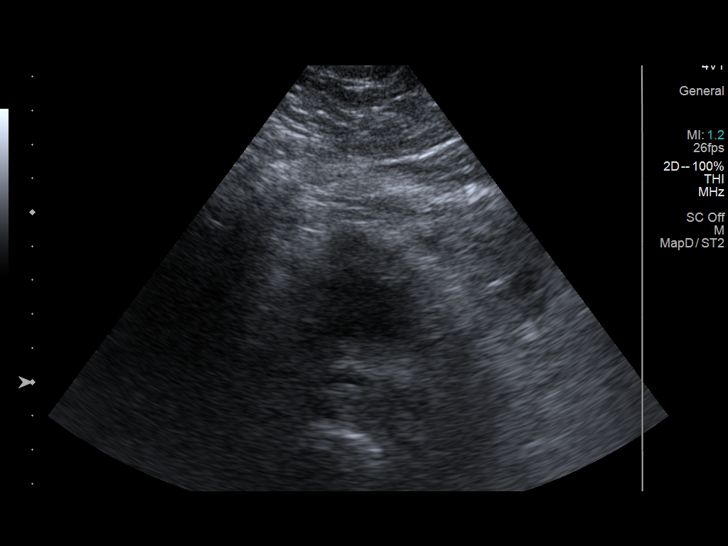
[im 32/35]
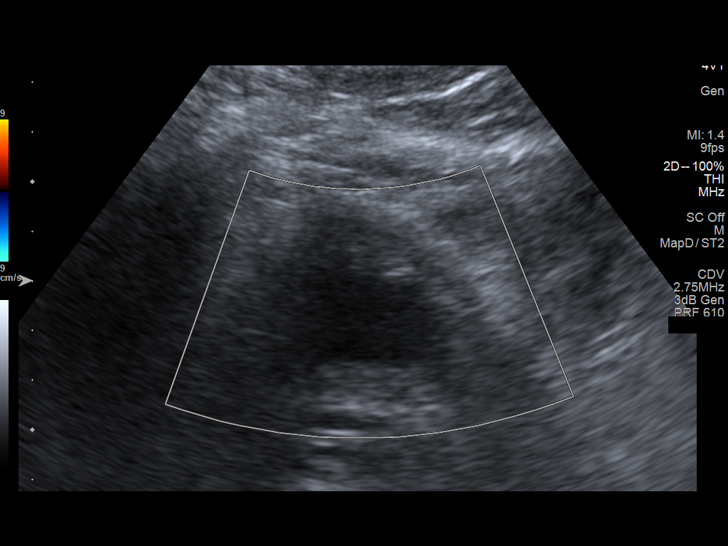
[im 35/35]
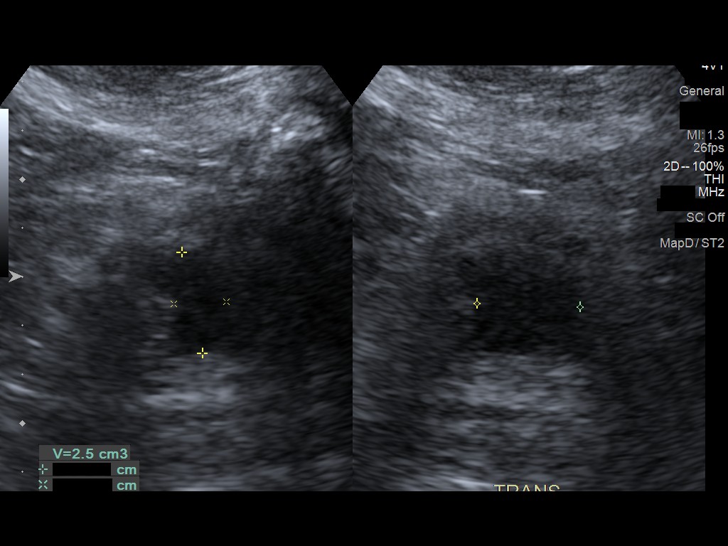

[14 of 25 positions shown; findings below may reference images not displayed]

FINDINGS: Right Kidney:

Renal measurements: 9.8 x 4.2 x 5.2 cm = volume: 113 mL. Mild
parenchyma atrophy. Normal echogenicity. No hydronephrosis or
shadowing stone.

Left Kidney:

Renal measurements: 9.2 x 5.0 x 4.3 cm = volume: 102 mL. Mild
parenchyma atrophy. Normal echogenicity. No hydronephrosis or
shadowing stone.

Bladder:

The urinary bladder is minimally distended. The patient voided prior
to the exam. The postvoid volume is approximately 3 cc.

Other:

None.
IMPRESSION: Unremarkable renal ultrasound.

## 2022-01-30 ENCOUNTER — Other Ambulatory Visit: Payer: Self-pay | Admitting: Internal Medicine

## 2022-01-30 ENCOUNTER — Telehealth: Payer: Self-pay | Admitting: Internal Medicine

## 2022-01-30 DIAGNOSIS — G4700 Insomnia, unspecified: Secondary | ICD-10-CM

## 2022-01-30 MED ORDER — ZOLPIDEM TARTRATE 10 MG PO TABS: 10.0000 mg | ORAL_TABLET | Freq: Every evening | ORAL | 2 refills | Status: DC | PRN

## 2022-01-30 NOTE — Telephone Encounter (Signed)
Patient needs refills on zolpidem (AMBIEN) 10 MG tablet   Patient is completely out of medicine  [facility], [place], [place]

## 2022-02-06 DIAGNOSIS — E1122 Type 2 diabetes mellitus with diabetic chronic kidney disease: Secondary | ICD-10-CM | POA: Diagnosis not present

## 2022-02-06 DIAGNOSIS — N189 Chronic kidney disease, unspecified: Secondary | ICD-10-CM | POA: Diagnosis not present

## 2022-02-06 DIAGNOSIS — E1129 Type 2 diabetes mellitus with other diabetic kidney complication: Secondary | ICD-10-CM | POA: Diagnosis not present

## 2022-02-06 DIAGNOSIS — R809 Proteinuria, unspecified: Secondary | ICD-10-CM | POA: Diagnosis not present

## 2022-02-06 DIAGNOSIS — I129 Hypertensive chronic kidney disease with stage 1 through stage 4 chronic kidney disease, or unspecified chronic kidney disease: Secondary | ICD-10-CM | POA: Diagnosis not present

## 2022-02-13 ENCOUNTER — Other Ambulatory Visit: Payer: Self-pay | Admitting: Internal Medicine

## 2022-02-13 ENCOUNTER — Telehealth: Payer: Self-pay | Admitting: Internal Medicine

## 2022-02-13 DIAGNOSIS — R110 Nausea: Secondary | ICD-10-CM

## 2022-02-13 MED ORDER — ONDANSETRON 4 MG PO TBDP: 4.0000 mg | ORAL_TABLET | Freq: Three times a day (TID) | ORAL | 0 refills | Status: DC | PRN

## 2022-02-13 NOTE — Telephone Encounter (Signed)
Pt is out of her nausea medication. Can you please refill?   ondansetron (ZOFRAN ODT) 4 MG disintegrating tablet  [facility] [place]

## 2022-02-15 DIAGNOSIS — R809 Proteinuria, unspecified: Secondary | ICD-10-CM | POA: Diagnosis not present

## 2022-02-15 DIAGNOSIS — E211 Secondary hyperparathyroidism, not elsewhere classified: Secondary | ICD-10-CM | POA: Diagnosis not present

## 2022-02-15 DIAGNOSIS — E1122 Type 2 diabetes mellitus with diabetic chronic kidney disease: Secondary | ICD-10-CM | POA: Diagnosis not present

## 2022-02-15 DIAGNOSIS — I129 Hypertensive chronic kidney disease with stage 1 through stage 4 chronic kidney disease, or unspecified chronic kidney disease: Secondary | ICD-10-CM | POA: Diagnosis not present

## 2022-02-15 DIAGNOSIS — N189 Chronic kidney disease, unspecified: Secondary | ICD-10-CM | POA: Diagnosis not present

## 2022-02-15 DIAGNOSIS — D7589 Other specified diseases of blood and blood-forming organs: Secondary | ICD-10-CM | POA: Diagnosis not present

## 2022-02-15 DIAGNOSIS — E1129 Type 2 diabetes mellitus with other diabetic kidney complication: Secondary | ICD-10-CM | POA: Diagnosis not present

## 2022-02-23 ENCOUNTER — Ambulatory Visit: Payer: Medicare Other | Admitting: Physician Assistant

## 2022-03-05 ENCOUNTER — Other Ambulatory Visit: Payer: Self-pay

## 2022-03-05 ENCOUNTER — Other Ambulatory Visit: Payer: Self-pay | Admitting: Internal Medicine

## 2022-03-05 DIAGNOSIS — R110 Nausea: Secondary | ICD-10-CM

## 2022-03-05 DIAGNOSIS — F419 Anxiety disorder, unspecified: Secondary | ICD-10-CM

## 2022-03-05 DIAGNOSIS — E039 Hypothyroidism, unspecified: Secondary | ICD-10-CM

## 2022-03-05 MED ORDER — PANTOPRAZOLE SODIUM 40 MG PO TBEC: 40.0000 mg | DELAYED_RELEASE_TABLET | Freq: Every morning | ORAL | 1 refills | Status: DC

## 2022-03-05 MED ORDER — ALPRAZOLAM 0.5 MG PO TABS: 0.5000 mg | ORAL_TABLET | Freq: Two times a day (BID) | ORAL | 3 refills | Status: DC | PRN

## 2022-03-05 MED ORDER — ONDANSETRON 4 MG PO TBDP: 4.0000 mg | ORAL_TABLET | Freq: Three times a day (TID) | ORAL | 0 refills | Status: DC | PRN

## 2022-03-05 MED ORDER — LEVOTHYROXINE SODIUM 50 MCG PO TABS: 50.0000 ug | ORAL_TABLET | Freq: Every day | ORAL | 11 refills | Status: DC

## 2022-03-05 NOTE — Telephone Encounter (Signed)
Please send alprazolam electronically If you would like all other medications have been sent

## 2022-04-09 NOTE — Progress Notes (Incomplete)
H&P  Chief Complaint: ***  History of Present Illness: [name] is a 76 y.o. year old female ***  Past Medical History:  Diagnosis Date   Acute ischemic colitis ([place]) 09/11/2005   Anxiety    Arthritis    Atherosclerotic vascular disease    Calcified plaque at the origin of the celiac and SMA   CHF (congestive heart failure) (HCC)    Coronary atherosclerosis of native coronary artery    Mulitvessel LVEF 50-50%, DES circ 1/11, occluded SVG to OM and SVG to diagonal , LVEF 60%   COVID-19 virus infection 11/07/2020   Depression    Diverticulosis of colon    Essential hypertension    Hypothyroidism    Mesenteric ischemia (HCC)    Mitral regurgitation    Mild to moderate   Mixed hyperlipidemia    Myocardial infarction ([place]) 1990   Obesity    Orthostatic hypotension    PAD (peripheral artery disease) (HCC)    PVC's (premature ventricular contractions)    Schizophrenia (HCC)    Sleep apnea    CPAP   TIA (transient ischemic attack) 11/20/2017   Tubular adenoma    Type 2 diabetes mellitus (HCC)    Vascular complications of mesenteric artery     Past Surgical History:  Procedure Laterality Date   BACK SURGERY     Lumbar spine surgery   Carotid endarectomy Bilateral    CARPAL TUNNEL RELEASE     Bilateral   CATARACT EXTRACTION W/PHACO Right 08/24/2013   Procedure: CATARACT EXTRACTION PHACO AND INTRAOCULAR LENS PLACEMENT ([place]);  Surgeon: Tonny Branch, MD;  Location: [facility];  Service: Ophthalmology;  Laterality: Right;  CDE 8.68   COLONOSCOPY  09/14/2005   Dr. Leonard Schwartz colitis   COLONOSCOPY WITH ESOPHAGOGASTRODUODENOSCOPY (EGD)  04/14/2012   Dr. Gala Romney- EGD= gastric erosions of doubtful clinical significance per bx- chronic inactive gastritis, benign small bowel mucosa. TCS=colonic diverticulosis, tubular adenoma   CORONARY ARTERY BYPASS GRAFT     LIMA-LAD; SVG-OM; SVG-DX in [place]     Cervical laminectomy   PARTIAL HYSTERECTOMY     RIGHT/LEFT  HEART CATH AND CORONARY/GRAFT ANGIOGRAPHY N/A 04/12/2021   Procedure: RIGHT/LEFT HEART CATH AND CORONARY/GRAFT ANGIOGRAPHY;  Surgeon: Wellington Hampshire, MD;  Location: [facility];  Service: Cardiovascular;  Laterality: N/A;   stents x4      Home Medications:  (Not in a hospital admission)   Allergies: No Known Allergies  Family History  Problem Relation Age of Onset   Alcohol abuse Mother    Alcohol abuse Father    Coronary artery disease Other    Hypertension Other    Crohn's disease Sister 62   Stroke Daughter     Social History:  reports that she has been smoking cigarettes. She started smoking about 3 years ago. She has a 25.00 pack-year smoking history. She has never been exposed to tobacco smoke. She has never used smokeless tobacco. She reports current alcohol use. She reports that she does not use drugs.  ROS: A complete review of systems was performed.  All systems are negative except for pertinent findings as noted.  Physical Exam:  Vital signs in last 24 hours: '@VSRANGES'$ @ General:  Alert and oriented, No acute distress HEENT: Normocephalic, atraumatic Neck: No JVD or lymphadenopathy Cardiovascular: Regular rate  Lungs: Normal inspiratory/expiratory excursion Abdomen: Soft, nontender, nondistended, no abdominal masses Back: No CVA tenderness Extremities: No edema Neurologic: Grossly intact  I have reviewed prior pt notes  I have reviewed notes from referring/previous physicians  I have reviewed urinalysis results  I have independently reviewed prior imaging  I have reviewed prior urine culture   Impression/Assessment:  ***  Plan:  ***  Lillette Boxer [name] 04/09/2022, 9:00 AM  Lillette Boxer. [name] MD

## 2022-04-10 ENCOUNTER — Other Ambulatory Visit: Payer: Self-pay | Admitting: Cardiology

## 2022-04-10 ENCOUNTER — Other Ambulatory Visit: Payer: Self-pay | Admitting: Internal Medicine

## 2022-04-10 ENCOUNTER — Telehealth: Payer: Self-pay | Admitting: Internal Medicine

## 2022-04-10 ENCOUNTER — Ambulatory Visit: Payer: Medicare Other | Admitting: Urology

## 2022-04-10 DIAGNOSIS — G4700 Insomnia, unspecified: Secondary | ICD-10-CM

## 2022-04-10 NOTE — Telephone Encounter (Signed)
Spoke to granddaughter

## 2022-04-10 NOTE — Telephone Encounter (Signed)
Patient grandaughter Joelene Millin asked for nurse to give her a call about her appt she had foot surgery will not be with her 12/15 [phone]

## 2022-04-13 ENCOUNTER — Telehealth: Payer: Self-pay | Admitting: Internal Medicine

## 2022-04-13 ENCOUNTER — Encounter: Payer: Self-pay | Admitting: Internal Medicine

## 2022-04-13 ENCOUNTER — Ambulatory Visit (INDEPENDENT_AMBULATORY_CARE_PROVIDER_SITE_OTHER): Payer: Medicare Other | Admitting: Internal Medicine

## 2022-04-13 VITALS — BP 133/79 | HR 78 | Ht 63.0 in | Wt 209.8 lb

## 2022-04-13 DIAGNOSIS — N1831 Chronic kidney disease, stage 3a: Secondary | ICD-10-CM

## 2022-04-13 DIAGNOSIS — J9611 Chronic respiratory failure with hypoxia: Secondary | ICD-10-CM | POA: Diagnosis not present

## 2022-04-13 DIAGNOSIS — N1832 Chronic kidney disease, stage 3b: Secondary | ICD-10-CM | POA: Diagnosis not present

## 2022-04-13 DIAGNOSIS — J449 Chronic obstructive pulmonary disease, unspecified: Secondary | ICD-10-CM | POA: Diagnosis not present

## 2022-04-13 DIAGNOSIS — E1122 Type 2 diabetes mellitus with diabetic chronic kidney disease: Secondary | ICD-10-CM

## 2022-04-13 DIAGNOSIS — I10 Essential (primary) hypertension: Secondary | ICD-10-CM | POA: Diagnosis not present

## 2022-04-13 DIAGNOSIS — Z79899 Other long term (current) drug therapy: Secondary | ICD-10-CM

## 2022-04-13 DIAGNOSIS — E039 Hypothyroidism, unspecified: Secondary | ICD-10-CM | POA: Diagnosis not present

## 2022-04-13 DIAGNOSIS — J309 Allergic rhinitis, unspecified: Secondary | ICD-10-CM | POA: Insufficient documentation

## 2022-04-13 DIAGNOSIS — J3089 Other allergic rhinitis: Secondary | ICD-10-CM

## 2022-04-13 DIAGNOSIS — Z23 Encounter for immunization: Secondary | ICD-10-CM

## 2022-04-13 DIAGNOSIS — F411 Generalized anxiety disorder: Secondary | ICD-10-CM

## 2022-04-13 DIAGNOSIS — F331 Major depressive disorder, recurrent, moderate: Secondary | ICD-10-CM

## 2022-04-13 LAB — POCT GLYCOSYLATED HEMOGLOBIN (HGB A1C): HbA1c, POC (controlled diabetic range): 6 % (ref 0.0–7.0)

## 2022-04-13 MED ORDER — STIOLTO RESPIMAT 2.5-2.5 MCG/ACT IN AERS: 2.0000 | INHALATION_SPRAY | Freq: Every day | RESPIRATORY_TRACT | 11 refills | Status: DC

## 2022-04-13 MED ORDER — IPRATROPIUM BROMIDE 0.03 % NA SOLN: 2.0000 | Freq: Two times a day (BID) | NASAL | 12 refills | Status: DC

## 2022-04-13 MED ORDER — ATROVENT HFA 17 MCG/ACT IN AERS: 2.0000 | INHALATION_SPRAY | Freq: Four times a day (QID) | RESPIRATORY_TRACT | 12 refills | Status: DC | PRN

## 2022-04-13 MED ORDER — CITALOPRAM HYDROBROMIDE 20 MG PO TABS: 20.0000 mg | ORAL_TABLET | Freq: Every day | ORAL | 1 refills | Status: DC

## 2022-04-13 NOTE — Assessment & Plan Note (Addendum)
Due to underlying COPD Uses Flovent and as needed albuterol Uses O2 at nighttime, O2 sat drops below 88% at nighttime, new Rx was sent to [facility]

## 2022-04-13 NOTE — Progress Notes (Signed)
Established Patient Office Visit  Subjective:  Patient ID: [name], female    DOB: 09-03-1945  Age: 76 y.o. MRN: [id number]  CC:  Chief Complaint  Patient presents with   Follow-up    Follow up for hypertension. Patient experiencing COPD. She also needs her xanax and ambien refilled but would like an increase in dosage    HPI [name] is a 76 y.o. female with past medical history of hypertension, hyperlipidemia, mesenteric ischemia status post right hemicolectomy, coronary artery disease, hypothyroidism, type 2 diabetes mellitus, depression with anxiety, obesity, arthritis, chronic back pain who presents for f/u of her chronic medical conditions.  HTN: BP is WNL.  She has been taking valsartan 80 mg QD, amlodipine 10 mg daily and metoprolol 25 mg twice daily.  She also takes Imdur 60 mg daily for CAD.  She still complains of mild headache and nausea.  Denies any dizziness, chest pain or palpitations.  MDD and GAD: She has anhedonia, insomnia, lack of interest in routine activities and agitation. Denies any SI or HI. She had stopped taking Zoloft as it was not helping. In the last visit, she was placed on Celexa. She takes Xanax for anxiety and takes Ambien PRN for insomnia.  COPD: She has been using Flovent and as needed albuterol, but does not use it regularly.  She uses home O2 at nighttime for dyspnea and hypoxia (O2 sat less than 88%). She reports worsening of dyspnea for the last 2 months. Denies any fever or chills. C/o chronic nasal congestion and postnasal drip.      Past Medical History:  Diagnosis Date   Acute ischemic colitis ([place]) 09/11/2005   Anxiety    Arthritis    Atherosclerotic vascular disease    Calcified plaque at the origin of the celiac and SMA   CHF (congestive heart failure) (HCC)    Coronary atherosclerosis of native coronary artery    Mulitvessel LVEF 50-50%, DES circ 1/11, occluded SVG to OM and SVG to diagonal , LVEF 60%   COVID-19  virus infection 11/07/2020   Depression    Diverticulosis of colon    Essential hypertension    Hypothyroidism    Mesenteric ischemia (HCC)    Mitral regurgitation    Mild to moderate   Mixed hyperlipidemia    Myocardial infarction ([place]) 1990   Obesity    Orthostatic hypotension    PAD (peripheral artery disease) (HCC)    PVC's (premature ventricular contractions)    Schizophrenia (HCC)    Sleep apnea    CPAP   TIA (transient ischemic attack) 11/20/2017   Tubular adenoma    Type 2 diabetes mellitus (HCC)    Vascular complications of mesenteric artery     Past Surgical History:  Procedure Laterality Date   BACK SURGERY     Lumbar spine surgery   Carotid endarectomy Bilateral    CARPAL TUNNEL RELEASE     Bilateral   CATARACT EXTRACTION W/PHACO Right 08/24/2013   Procedure: CATARACT EXTRACTION PHACO AND INTRAOCULAR LENS PLACEMENT ([place]);  Surgeon: Tonny Branch, MD;  Location: [facility];  Service: Ophthalmology;  Laterality: Right;  CDE 8.68   COLONOSCOPY  09/14/2005   Dr. Leonard Schwartz colitis   COLONOSCOPY WITH ESOPHAGOGASTRODUODENOSCOPY (EGD)  04/14/2012   Dr. Gala Romney- EGD= gastric erosions of doubtful clinical significance per bx- chronic inactive gastritis, benign small bowel mucosa. TCS=colonic diverticulosis, tubular adenoma   CORONARY ARTERY BYPASS GRAFT     LIMA-LAD; SVG-OM; SVG-DX in [place]  NECK SURGERY     Cervical laminectomy   PARTIAL HYSTERECTOMY     RIGHT/LEFT HEART CATH AND CORONARY/GRAFT ANGIOGRAPHY N/A 04/12/2021   Procedure: RIGHT/LEFT HEART CATH AND CORONARY/GRAFT ANGIOGRAPHY;  Surgeon: Wellington Hampshire, MD;  Location: [facility];  Service: Cardiovascular;  Laterality: N/A;   stents x4      Family History  Problem Relation Age of Onset   Alcohol abuse Mother    Alcohol abuse Father    Coronary artery disease Other    Hypertension Other    Crohn's disease Sister 9   Stroke Daughter     Social History   Socioeconomic History   Marital  status: Widowed    Spouse name: Not on file   Number of children: 4   Years of education: Not on file   Highest education level: Not on file  Occupational History   Occupation: DISABLED    Employer: UNEMPLOYED  Tobacco Use   Smoking status: Light Smoker    Packs/day: 0.50    Years: 50.00    Total pack years: 25.00    Types: Cigarettes    Start date: 12/30/2018    Passive exposure: Never   Smokeless tobacco: Never  Vaping Use   Vaping Use: Never used  Substance and Sexual Activity   Alcohol use: Yes    Alcohol/week: 0.0 standard drinks of alcohol    Comment: occasionally    Drug use: No   Sexual activity: Not on file  Other Topics Concern   Not on file  Social History Narrative   LIves alone   Social Determinants of Health   Financial Resource Strain: Low Risk  (02/27/2021)   Overall Financial Resource Strain (CARDIA)    Difficulty of Paying Living Expenses: Not hard at all  Food Insecurity: No Food Insecurity (02/27/2021)   Hunger Vital Sign    Worried About Running Out of Food in the Last Year: Never true    Ran Out of Food in the Last Year: Never true  Transportation Needs: No Transportation Needs (02/27/2021)   PRAPARE - Hydrologist (Medical): No    Lack of Transportation (Non-Medical): No  Physical Activity: Inactive (02/27/2021)   Exercise Vital Sign    Days of Exercise per Week: 0 days    Minutes of Exercise per Session: 0 min  Stress: No Stress Concern Present (02/27/2021)   [place]    Feeling of Stress : Only a little  Social Connections: Socially Isolated (02/27/2021)   Social Connection and Isolation Panel [NHANES]    Frequency of Communication with Friends and Family: More than three times a week    Frequency of Social Gatherings with Friends and Family: Never    Attends Religious Services: Never    Marine scientist or Organizations: No    Attends  Archivist Meetings: Never    Marital Status: Widowed  Intimate Partner Violence: Not At Risk (02/27/2021)   Humiliation, Afraid, Rape, and Kick questionnaire    Fear of Current or Ex-Partner: No    Emotionally Abused: No    Physically Abused: No    Sexually Abused: No    Outpatient Medications Prior to Visit  Medication Sig Dispense Refill   calcitRIOL (ROCALTROL) 0.25 MCG capsule Take 1 capsule by mouth 2 (two) times a week.     sertraline (ZOLOFT) 50 MG tablet Take 50 mg by mouth daily.     albuterol (VENTOLIN  HFA) 108 (90 Base) MCG/ACT inhaler Inhale 1 puff into the lungs every 6 (six) hours as needed for wheezing or shortness of breath.     ALPRAZolam (XANAX) 0.5 MG tablet Take 1 tablet (0.5 mg total) by mouth 2 (two) times daily as needed. for anxiety 60 tablet 3   amLODipine (NORVASC) 10 MG tablet Take 10 mg by mouth daily.     aspirin EC 81 MG tablet Take 81 mg by mouth at bedtime.     Biotin 10000 MCG TABS Take 1 tablet by mouth daily.     blood glucose meter kit and supplies Dispense based on patient and insurance preference. Use up to four times daily as directed. (FOR ICD-10 E10.9, E11.9). 1 each 0   Cholecalciferol (VITAMIN D3) 50 MCG (2000 UT) TABS Take 1 tablet by mouth daily.     clopidogrel (PLAVIX) 75 MG tablet Take 1 tablet (75 mg total) by mouth daily. 90 tablet 3   Collagen-Vitamin C (COLLAGEN PLUS VITAMIN C PO) Take 1 tablet by mouth daily.     docusate sodium (COLACE) 100 MG capsule Take 100 mg by mouth as needed for mild constipation.     famotidine (PEPCID) 40 MG tablet Take 1 tablet (40 mg total) by mouth daily as needed for heartburn or indigestion. 30 tablet 5   FIBER COMPLETE PO Take 2 tablets by mouth daily. Fiber Well gummies (Patient not taking: Reported on 12/20/2021)     fluticasone (FLOVENT HFA) 110 MCG/ACT inhaler Inhale 2 puffs into the lungs 2 (two) times daily.     isosorbide mononitrate (IMDUR) 60 MG 24 hr tablet TAKE ONE (1) TABLET EACH DAY  90 tablet 1   levothyroxine (SYNTHROID) 50 MCG tablet Take 1 tablet (50 mcg total) by mouth daily before breakfast. 30 tablet 11   Melatonin 10 MG TABS Take 10 mg by mouth at bedtime as needed (sleep).     metoprolol tartrate (LOPRESSOR) 25 MG tablet TAKE ONE (1) TABLET BY MOUTH TWO (2) TIMES DAILY 180 tablet 1   nitroGLYCERIN (NITROSTAT) 0.4 MG SL tablet Place 0.4 mg under the tongue every 5 (five) minutes x 3 doses as needed for chest pain.     ondansetron (ZOFRAN ODT) 4 MG disintegrating tablet Take 1 tablet (4 mg total) by mouth every 8 (eight) hours as needed for nausea or vomiting. 20 tablet 0   OneTouch Delica Lancets 33I MISC 4 TIMES DAILY 100 each 5   ONETOUCH VERIO test strip 4 TIMES DAILY 100 each 5   pantoprazole (PROTONIX) 40 MG tablet Take 1 tablet (40 mg total) by mouth every morning. 90 tablet 1   pravastatin (PRAVACHOL) 80 MG tablet Take 1 tablet (80 mg total) by mouth daily. 90 tablet 3   torsemide (DEMADEX) 20 MG tablet Take 40 mg by mouth as needed.     valsartan (DIOVAN) 40 MG tablet Take 80 mg by mouth daily.     zolpidem (AMBIEN) 10 MG tablet TAKE 1 TABLET BY MOUTH AT BEDTIME AS NEEDED FOR SLEEP 30 tablet 3   citalopram (CELEXA) 10 MG tablet Take 1 tablet (10 mg total) by mouth daily. 90 tablet 1   Facility-Administered Medications Prior to Visit  Medication Dose Route Frequency Provider Last Rate Last Admin   sodium chloride flush (NS) 0.9 % injection 3 mL  3 mL Intravenous Q12H Satira Sark, MD        No Known Allergies  ROS Review of Systems  Constitutional:  Negative for chills and  fever.  HENT:  Negative for congestion, sinus pressure and sinus pain.   Eyes:  Negative for pain and discharge.  Respiratory:  Negative for cough and shortness of breath.   Cardiovascular:  Negative for chest pain and palpitations.  Gastrointestinal:  Positive for abdominal pain and nausea. Negative for diarrhea and vomiting.  Genitourinary:  Negative for dysuria and  hematuria.  Musculoskeletal:  Positive for arthralgias, back pain, gait problem and joint swelling.  Skin:        Bruising over UE and LE  Neurological:  Positive for weakness and headaches.  Psychiatric/Behavioral:  Positive for agitation and sleep disturbance. Negative for behavioral problems. The patient is nervous/anxious.       Objective:    Physical Exam Vitals reviewed.  Constitutional:      General: She is not in acute distress.    Appearance: She is obese. She is not diaphoretic.  HENT:     Head: Normocephalic and atraumatic.     Nose: Congestion present.     Mouth/Throat:     Mouth: Mucous membranes are moist.     Pharynx: No posterior oropharyngeal erythema.  Eyes:     General: No scleral icterus.    Extraocular Movements: Extraocular movements intact.  Cardiovascular:     Rate and Rhythm: Normal rate and regular rhythm.     Pulses: Normal pulses.     Heart sounds: Normal heart sounds. No murmur heard. Pulmonary:     Breath sounds: Normal breath sounds. No wheezing or rales.  Abdominal:     Palpations: Abdomen is soft.     Tenderness: There is no abdominal tenderness.  Musculoskeletal:     Cervical back: Normal range of motion and neck supple. No rigidity or tenderness.     Right lower leg: No edema.     Left lower leg: No edema.  Skin:    General: Skin is warm.     Findings: Bruising (Over b/l UE and LE) present.  Neurological:     General: No focal deficit present.     Mental Status: She is alert and oriented to person, place, and time.     Sensory: No sensory deficit.     Motor: No weakness.     Gait: Gait abnormal (Likely in the setting of baseline hip pain).  Psychiatric:        Mood and Affect: Mood normal.        Behavior: Behavior normal.     BP 133/79 (BP Location: Left Arm, Patient Position: Sitting, Cuff Size: Large)   Pulse 78   Ht _0  (1.6 m)   Wt 209 lb 12.8 oz (95.2 kg)   SpO2 93%   BMI 37.16 kg/m  Wt Readings from Last 3  Encounters:  04/13/22 209 lb 12.8 oz (95.2 kg)  12/20/21 218 lb 6.4 oz (99.1 kg)  12/13/21 211 lb 9.6 oz (96 kg)    Lab Results  Component Value Date   TSH 3.450 09/12/2021   Lab Results  Component Value Date   WBC 6.9 09/12/2021   HGB 13.7 09/12/2021   HCT 41.2 09/12/2021   MCV 95 09/12/2021   PLT 269 09/12/2021   Lab Results  Component Value Date   NA 139 09/12/2021   K 5.9 (H) 09/12/2021   CO2 25 09/12/2021   GLUCOSE 105 (H) 09/12/2021   BUN 16 09/12/2021   CREATININE 1.27 (H) 09/12/2021   BILITOT 0.7 09/12/2021   ALKPHOS 114 09/12/2021   AST 30 09/12/2021  ALT 13 09/12/2021   PROT 7.6 09/12/2021   ALBUMIN 4.4 09/12/2021   CALCIUM 10.2 09/12/2021   ANIONGAP 9 03/13/2021   EGFR 44 (L) 09/12/2021   Lab Results  Component Value Date   CHOL 137 09/12/2021   Lab Results  Component Value Date   HDL 46 09/12/2021   Lab Results  Component Value Date   LDLCALC 68 09/12/2021   Lab Results  Component Value Date   TRIG 128 09/12/2021   Lab Results  Component Value Date   CHOLHDL 3.0 09/12/2021   Lab Results  Component Value Date   HGBA1C 6.0 04/13/2022      Assessment & Plan:   Problem List Items Addressed This Visit       Cardiovascular and Mediastinum   Benign essential hypertension - Primary    BP Readings from Last 1 Encounters:  04/13/22 133/79  Overall well-controlled considering her age 47 for compliance with the medications Advised DASH diet      Relevant Medications   amLODipine (NORVASC) 10 MG tablet     Respiratory   COPD (chronic obstructive pulmonary disease) (HCC)    Uncontrolled with Flovent and PRN Albuterol Added Stiolto Needs to use maintenance inhalers regularly Needs to cut down -> quit smoking      Relevant Medications   Tiotropium Bromide-Olodaterol (STIOLTO RESPIMAT) 2.5-2.5 MCG/ACT AERS   ipratropium (ATROVENT) 0.03 % nasal spray   Chronic respiratory failure with hypoxia (HCC)    Due to underlying  COPD Uses Flovent and as needed albuterol Uses O2 at nighttime, O2 sat drops below 88% at nighttime, new Rx was sent to [facility]      Allergic rhinitis    Chronic nasal congestion and postnasal drip likely due to allergic rhinitis Added Atrovent nasal spray      Relevant Medications   ipratropium (ATROVENT) 0.03 % nasal spray     Endocrine   DM2 (diabetes mellitus, type 2) ([place])     Lab Results  Component Value Date   HGBA1C 6.0 04/13/2022   Diet controlled Avoid tighter control in her case due to risk of hypoglycemia related fall      Relevant Orders   POCT glycosylated hemoglobin (Hb A1C) (Completed)   Hypothyroidism    Lab Results  Component Value Date   TSH 3.450 09/12/2021  On Levothyroxine 50 mcg recently Check TSH and free T4, adjust dose accordingly        Genitourinary   Stage 3 chronic kidney disease ([place])    Likely due to HTN and DM On Torsemide for HFrEF Avoid nephrotoxic agents including NSAIDs Followed by Dr. Theador Hawthorne - last BMP and visit note reviewed        Other   MDD (major depressive disorder), recurrent episode, moderate ([place])    [place] Office Visit from 04/13/2022 in [facility]  PHQ-9 Total Score 19     Uncontrolled Had started Zoloft 50 mg daily, but she stopped taking it as it was not helping her Had switched to Celexa, increased dose to 20 mg QD Takes Ambien as needed for insomnia      Relevant Medications   citalopram (CELEXA) 20 MG tablet   GAD (generalized anxiety disorder)    Takes Xanax, sometimes twice daily - still has anhedonia and spells of anxiety, would avoid increasing dose of Xanax for now as she is also on Ambien Added Celexa for MDD and anxiety, increased dose today Patient also takes Ambien for insomnia. She lives alone and  could be a reason for her anxiety as well      Relevant Medications   citalopram (CELEXA) 20 MG tablet   Other Relevant Orders   ToxASSURE Select 13 (MW),  Urine   Other Visit Diagnoses     Chronic prescription benzodiazepine use       Relevant Orders   ToxASSURE Select 13 (MW), Urine   Need for immunization against influenza       Relevant Orders   Flu Vaccine QUAD High Dose(Fluad) (Completed)       Meds ordered this encounter  Medications   Tiotropium Bromide-Olodaterol (STIOLTO RESPIMAT) 2.5-2.5 MCG/ACT AERS    Sig: Inhale 2 puffs into the lungs daily.    Dispense:  4 g    Refill:  11   citalopram (CELEXA) 20 MG tablet    Sig: Take 1 tablet (20 mg total) by mouth daily.    Dispense:  90 tablet    Refill:  1    Dose change   DISCONTD: ipratropium (ATROVENT HFA) 17 MCG/ACT inhaler    Sig: Inhale 2 puffs into the lungs every 6 (six) hours as needed for wheezing.    Dispense:  1 each    Refill:  12   ipratropium (ATROVENT) 0.03 % nasal spray    Sig: Place 2 sprays into both nostrils every 12 (twelve) hours.    Dispense:  30 mL    Refill:  12    Please cancel Atrovent inhaler. Thank you.    Follow-up: Return in about 4 months (around 08/13/2022) for COPD and GAD.    Lindell Spar, MD

## 2022-04-13 NOTE — Assessment & Plan Note (Signed)
Uncontrolled with Flovent and PRN Albuterol Added Stiolto Needs to use maintenance inhalers regularly Needs to cut down -> quit smoking

## 2022-04-13 NOTE — Assessment & Plan Note (Signed)
Chronic nasal congestion and postnasal drip likely due to allergic rhinitis Added Atrovent nasal spray

## 2022-04-13 NOTE — Assessment & Plan Note (Signed)
Takes Xanax, sometimes twice daily - still has anhedonia and spells of anxiety, would avoid increasing dose of Xanax for now as she is also on Ambien Added Celexa for MDD and anxiety, increased dose today Patient also takes Ambien for insomnia. She lives alone and could be a reason for her anxiety as well

## 2022-04-13 NOTE — Assessment & Plan Note (Signed)
BP Readings from Last 1 Encounters:  04/13/22 133/79   Overall well-controlled considering her age 76 for compliance with the medications Advised DASH diet

## 2022-04-13 NOTE — Assessment & Plan Note (Signed)
Lab Results  Component Value Date   TSH 3.450 09/12/2021   On Levothyroxine 50 mcg recently Check TSH and free T4, adjust dose accordingly

## 2022-04-13 NOTE — Patient Instructions (Signed)
Please start using Stiolto inhaler as prescribed.  Please start taking Citalopram 20 mg instead of 10 mg.  Please continue taking other medications as prescribed.  Please continue to follow low salt diet and ambulate as tolerated.  Please try to cut down -> quit smoking.

## 2022-04-13 NOTE — Assessment & Plan Note (Signed)
[place] Office Visit from 04/13/2022 in [facility]  PHQ-9 Total Score 19      Uncontrolled Had started Zoloft 50 mg daily, but she stopped taking it as it was not helping her Had switched to Celexa, increased dose to 20 mg QD Takes Ambien as needed for insomnia

## 2022-04-13 NOTE — Assessment & Plan Note (Addendum)
  Lab Results  Component Value Date   HGBA1C 6.0 04/13/2022    Diet controlled Avoid tighter control in her case due to risk of hypoglycemia related fall

## 2022-04-13 NOTE — Telephone Encounter (Signed)
Pt granddaughter call back again needing to know about todays visit. She was advised Posey Pronto is gone for the day. Can someone please call her?

## 2022-04-13 NOTE — Assessment & Plan Note (Signed)
Likely due to HTN and DM On Torsemide for HFrEF Avoid nephrotoxic agents including NSAIDs Followed by Dr. Theador Hawthorne - last BMP and visit note reviewed

## 2022-04-13 NOTE — Telephone Encounter (Signed)
Pt granddaughter called wanting to know if someone can please call her to go over the visit pt had today. She generally comes with her but had surgery & was unable to come to the appt today. Can someone please call her when available?

## 2022-04-16 ENCOUNTER — Telehealth: Payer: Self-pay | Admitting: Internal Medicine

## 2022-04-16 ENCOUNTER — Other Ambulatory Visit: Payer: Self-pay | Admitting: Internal Medicine

## 2022-04-16 DIAGNOSIS — I739 Peripheral vascular disease, unspecified: Secondary | ICD-10-CM

## 2022-04-16 DIAGNOSIS — I25118 Atherosclerotic heart disease of native coronary artery with other forms of angina pectoris: Secondary | ICD-10-CM

## 2022-04-16 MED ORDER — PRAVASTATIN SODIUM 80 MG PO TABS: 80.0000 mg | ORAL_TABLET | Freq: Every day | ORAL | 3 refills | Status: DC

## 2022-04-16 NOTE — Telephone Encounter (Signed)
Lmtrc

## 2022-04-16 NOTE — Telephone Encounter (Signed)
Spoke to granddaughter

## 2022-04-16 NOTE — Telephone Encounter (Signed)
Patient's granddaughter [name] [phone]) called and stated that she is wanting to speak to someone regarding her grandmother's appointment on 04/13/22.  [name] is on the pt's DPR.

## 2022-04-19 LAB — TOXASSURE SELECT 13 (MW), URINE

## 2022-04-20 ENCOUNTER — Institutional Professional Consult (permissible substitution): Payer: Medicare Other | Admitting: Pulmonary Disease

## 2022-05-08 ENCOUNTER — Telehealth: Payer: Self-pay | Admitting: Internal Medicine

## 2022-05-08 ENCOUNTER — Other Ambulatory Visit: Payer: Self-pay | Admitting: Cardiology

## 2022-05-08 NOTE — Telephone Encounter (Signed)
lmtrc

## 2022-05-08 NOTE — Telephone Encounter (Signed)
Pt granddaughter called stating that pt is thinking some of her medications were stopped. She is wanting to confirm this. Can you please contact her when available?

## 2022-05-15 DIAGNOSIS — N189 Chronic kidney disease, unspecified: Secondary | ICD-10-CM | POA: Diagnosis not present

## 2022-05-15 DIAGNOSIS — E1122 Type 2 diabetes mellitus with diabetic chronic kidney disease: Secondary | ICD-10-CM | POA: Diagnosis not present

## 2022-05-15 DIAGNOSIS — E1129 Type 2 diabetes mellitus with other diabetic kidney complication: Secondary | ICD-10-CM | POA: Diagnosis not present

## 2022-05-15 DIAGNOSIS — R809 Proteinuria, unspecified: Secondary | ICD-10-CM | POA: Diagnosis not present

## 2022-05-15 DIAGNOSIS — E211 Secondary hyperparathyroidism, not elsewhere classified: Secondary | ICD-10-CM | POA: Diagnosis not present

## 2022-05-24 DIAGNOSIS — R809 Proteinuria, unspecified: Secondary | ICD-10-CM | POA: Diagnosis not present

## 2022-05-24 DIAGNOSIS — E211 Secondary hyperparathyroidism, not elsewhere classified: Secondary | ICD-10-CM | POA: Diagnosis not present

## 2022-05-24 DIAGNOSIS — E1122 Type 2 diabetes mellitus with diabetic chronic kidney disease: Secondary | ICD-10-CM | POA: Diagnosis not present

## 2022-05-24 DIAGNOSIS — I129 Hypertensive chronic kidney disease with stage 1 through stage 4 chronic kidney disease, or unspecified chronic kidney disease: Secondary | ICD-10-CM | POA: Diagnosis not present

## 2022-05-24 DIAGNOSIS — N189 Chronic kidney disease, unspecified: Secondary | ICD-10-CM | POA: Diagnosis not present

## 2022-05-24 DIAGNOSIS — E1129 Type 2 diabetes mellitus with other diabetic kidney complication: Secondary | ICD-10-CM | POA: Diagnosis not present

## 2022-06-04 ENCOUNTER — Other Ambulatory Visit: Payer: Self-pay | Admitting: Cardiology

## 2022-06-04 DIAGNOSIS — J9611 Chronic respiratory failure with hypoxia: Secondary | ICD-10-CM | POA: Diagnosis not present

## 2022-06-04 DIAGNOSIS — Z87891 Personal history of nicotine dependence: Secondary | ICD-10-CM | POA: Diagnosis not present

## 2022-06-04 DIAGNOSIS — J449 Chronic obstructive pulmonary disease, unspecified: Secondary | ICD-10-CM | POA: Diagnosis not present

## 2022-06-04 DIAGNOSIS — R9431 Abnormal electrocardiogram [ECG] [EKG]: Secondary | ICD-10-CM | POA: Diagnosis not present

## 2022-06-04 DIAGNOSIS — R55 Syncope and collapse: Secondary | ICD-10-CM | POA: Diagnosis not present

## 2022-06-04 DIAGNOSIS — M549 Dorsalgia, unspecified: Secondary | ICD-10-CM | POA: Diagnosis not present

## 2022-06-04 DIAGNOSIS — F17210 Nicotine dependence, cigarettes, uncomplicated: Secondary | ICD-10-CM | POA: Diagnosis not present

## 2022-06-04 DIAGNOSIS — R0689 Other abnormalities of breathing: Secondary | ICD-10-CM | POA: Diagnosis not present

## 2022-06-04 DIAGNOSIS — I129 Hypertensive chronic kidney disease with stage 1 through stage 4 chronic kidney disease, or unspecified chronic kidney disease: Secondary | ICD-10-CM | POA: Diagnosis not present

## 2022-06-04 DIAGNOSIS — I4891 Unspecified atrial fibrillation: Secondary | ICD-10-CM | POA: Diagnosis not present

## 2022-06-04 DIAGNOSIS — S29019A Strain of muscle and tendon of unspecified wall of thorax, initial encounter: Secondary | ICD-10-CM | POA: Diagnosis not present

## 2022-06-04 DIAGNOSIS — I2510 Atherosclerotic heart disease of native coronary artery without angina pectoris: Secondary | ICD-10-CM | POA: Diagnosis not present

## 2022-06-04 DIAGNOSIS — I6782 Cerebral ischemia: Secondary | ICD-10-CM | POA: Diagnosis not present

## 2022-06-04 DIAGNOSIS — Z79899 Other long term (current) drug therapy: Secondary | ICD-10-CM | POA: Diagnosis not present

## 2022-06-04 DIAGNOSIS — E039 Hypothyroidism, unspecified: Secondary | ICD-10-CM | POA: Diagnosis not present

## 2022-06-04 DIAGNOSIS — N189 Chronic kidney disease, unspecified: Secondary | ICD-10-CM | POA: Diagnosis not present

## 2022-06-04 DIAGNOSIS — R079 Chest pain, unspecified: Secondary | ICD-10-CM | POA: Diagnosis not present

## 2022-06-04 DIAGNOSIS — N179 Acute kidney failure, unspecified: Secondary | ICD-10-CM | POA: Diagnosis not present

## 2022-06-04 DIAGNOSIS — Z743 Need for continuous supervision: Secondary | ICD-10-CM | POA: Diagnosis not present

## 2022-06-04 DIAGNOSIS — R42 Dizziness and giddiness: Secondary | ICD-10-CM | POA: Diagnosis not present

## 2022-06-04 DIAGNOSIS — E1122 Type 2 diabetes mellitus with diabetic chronic kidney disease: Secondary | ICD-10-CM | POA: Diagnosis not present

## 2022-06-04 DIAGNOSIS — R531 Weakness: Secondary | ICD-10-CM | POA: Diagnosis not present

## 2022-06-05 DIAGNOSIS — Z79899 Other long term (current) drug therapy: Secondary | ICD-10-CM | POA: Diagnosis not present

## 2022-06-05 DIAGNOSIS — N189 Chronic kidney disease, unspecified: Secondary | ICD-10-CM | POA: Diagnosis not present

## 2022-06-05 DIAGNOSIS — J449 Chronic obstructive pulmonary disease, unspecified: Secondary | ICD-10-CM | POA: Diagnosis not present

## 2022-06-05 DIAGNOSIS — N179 Acute kidney failure, unspecified: Secondary | ICD-10-CM | POA: Diagnosis not present

## 2022-06-05 DIAGNOSIS — E1122 Type 2 diabetes mellitus with diabetic chronic kidney disease: Secondary | ICD-10-CM | POA: Diagnosis not present

## 2022-06-05 DIAGNOSIS — I2510 Atherosclerotic heart disease of native coronary artery without angina pectoris: Secondary | ICD-10-CM | POA: Diagnosis not present

## 2022-06-05 DIAGNOSIS — F17210 Nicotine dependence, cigarettes, uncomplicated: Secondary | ICD-10-CM | POA: Diagnosis not present

## 2022-06-05 DIAGNOSIS — E039 Hypothyroidism, unspecified: Secondary | ICD-10-CM | POA: Diagnosis not present

## 2022-06-05 DIAGNOSIS — R55 Syncope and collapse: Secondary | ICD-10-CM | POA: Diagnosis not present

## 2022-06-05 DIAGNOSIS — J9611 Chronic respiratory failure with hypoxia: Secondary | ICD-10-CM | POA: Diagnosis not present

## 2022-06-05 DIAGNOSIS — I129 Hypertensive chronic kidney disease with stage 1 through stage 4 chronic kidney disease, or unspecified chronic kidney disease: Secondary | ICD-10-CM | POA: Diagnosis not present

## 2022-06-11 ENCOUNTER — Encounter: Payer: Self-pay | Admitting: Internal Medicine

## 2022-06-11 ENCOUNTER — Ambulatory Visit (INDEPENDENT_AMBULATORY_CARE_PROVIDER_SITE_OTHER): Payer: Medicare Other | Admitting: Internal Medicine

## 2022-06-11 VITALS — BP 102/66 | HR 82 | Ht 63.0 in | Wt 204.1 lb

## 2022-06-11 DIAGNOSIS — J449 Chronic obstructive pulmonary disease, unspecified: Secondary | ICD-10-CM

## 2022-06-11 DIAGNOSIS — I10 Essential (primary) hypertension: Secondary | ICD-10-CM | POA: Diagnosis not present

## 2022-06-11 NOTE — Progress Notes (Unsigned)
   HPI:[name] is a 77 y.o. female who is here for hospital follow up. She was admitted to [facility] for presyncope.  She was found to have a low blood pressure. She had taken nitroglycerin. She did not know exactly why she felt bad, but took a nitroglycerin. She was educated at hospital on risk of causing worsening hypotension. She was given IV hydration and told to half her half her valsartan and amlodipine. Hold her metoprolol. Follow up with PCP to restart medications. Her daughter is with her today. She is concerned her mother is seeing multiple specialist and is on to many antihypertensives. In addition patient would like to be referred to pulmonologist for COPD. On review she is not currently using maintenance inhaler because she does not know how to use it.    Physical Exam: Vitals:   06/11/22 1323  BP: 102/66  Pulse: 82  SpO2: 93%  Weight: 204 lb 1.9 oz (92.6 kg)  Height: '5\' 3"'$  (1.6 m)     Physical Exam Constitutional:      Appearance: She is well-developed and well-groomed.  Eyes:     General: No scleral icterus.    Conjunctiva/sclera: Conjunctivae normal.  Cardiovascular:     Rate and Rhythm: Normal rate and regular rhythm.     Heart sounds: No murmur heard.    No friction rub. No gallop.  Pulmonary:     Effort: Pulmonary effort is normal.     Breath sounds: No wheezing, rhonchi or rales.  Musculoskeletal:     Right lower leg: No edema.     Left lower leg: No edema.  Skin:    General: Skin is warm and dry.      Assessment & Plan:   COPD (chronic obstructive pulmonary disease) ([place]) Patient not using Stiolto because she does not know how to use this inhaler.   Plan: - patient educated and demonstrated use of Stiolto inhaler during visit - Continue Stiolto - PRN albuterol    Benign essential hypertension Patient admitted for hypotension and presyncope after taking a SL nitroglycerin. She was recommended to half her medications but has  resumed them as prescribed. Current antihypertensive regimen is  Imdur 60 mg daily, amlodipine 10 mg daily, metoprolol 25 mg twice daily, valsartan 80 mg daily, and also take torsemide 40 mg as needed. Her blood pressure today is 102/66. No episodes of hypotension at home since discharge  Plan:  - Patient has CKD stage 3. History of CAD s/p CABG and DES.  - Stop amlodipine - Continue Imdur 60 mg daily, metoprolol 25 mg twice daily, valsartan 80 mg daily, and also take torsemide 40 mg - Record blood pressure daily at home.  Follow up in 2 weeks.    Lorene Dy, MD

## 2022-06-11 NOTE — Patient Instructions (Addendum)
Thank you for trusting me with your care. To recap, today we discussed the following:    You have had low blood pressure.  You are on multiple medications which lower your blood pressure.  You are taking Imdur 60 mg daily, amlodipine 10 mg daily, metoprolol 25 mg twice daily, valsartan 80 mg daily, and also take torsemide 40 mg as needed.  Your blood pressure today is 102/66, I recommend stopping amlodipine.  Keeping check of your blood pressure for 2 weeks have a follow-up appointment. Follow up sooner if having lightheaded feeling at home.   We reviewed how to use your COPD inhaler, Stiolto Respimat.  Continue using this inhaler daily and albuterol only if needed for shortness of breath.

## 2022-06-12 NOTE — Assessment & Plan Note (Signed)
Patient admitted for hypotension and presyncope after taking a SL nitroglycerin. She was recommended to half her medications but has resumed them as prescribed. Current antihypertensive regimen is  Imdur 60 mg daily, amlodipine 10 mg daily, metoprolol 25 mg twice daily, valsartan 80 mg daily, and also take torsemide 40 mg as needed. Her blood pressure today is 102/66. No episodes of hypotension at home since discharge  Plan:  - Patient has CKD stage 3. History of CAD s/p CABG and DES.  - Stop amlodipine - Continue Imdur 60 mg daily, metoprolol 25 mg twice daily, valsartan 80 mg daily, and also take torsemide 40 mg - Record blood pressure daily at home.  Follow up in 2 weeks.

## 2022-06-12 NOTE — Assessment & Plan Note (Signed)
Patient not using Stiolto because she does not know how to use this inhaler.   Plan: - patient educated and demonstrated use of Stiolto inhaler during visit - Continue Stiolto - PRN albuterol

## 2022-06-27 ENCOUNTER — Ambulatory Visit: Payer: Medicare Other | Admitting: Internal Medicine

## 2022-07-05 ENCOUNTER — Encounter: Payer: Self-pay | Admitting: *Deleted

## 2022-07-10 ENCOUNTER — Ambulatory Visit: Payer: Medicare Other | Admitting: Internal Medicine

## 2022-07-10 ENCOUNTER — Encounter: Payer: Self-pay | Admitting: Internal Medicine

## 2022-07-15 NOTE — Progress Notes (Unsigned)
Cardiology Office Note  Date: 07/16/2022   ID: [name], DOB 1945-07-22, MRN [id number]  History of Present Illness: [name] is a 77 y.o. female last seen in August 2023.  She is here for a follow-up visit.  Records indicate evaluation at [facility] in February for evaluation of near syncope.  Symptoms had occurred after taking the nitroglycerin, also patient suspected to be somewhat intravascularly depleted given mild orthostasis.  She was given IV fluids with repletion of electrolytes and observe on telemetry.  CT of the head showed no acute findings.  Ultimately antihypertensive regimen was down titrated as well.  I was able to review her ECGs from hospital stay which show what looks to be atypical atrial flutter with variable conduction and controlled response.  This diagnosis was not mentioned in the discharge summary.  She does have a previous history of postoperative atrial fibrillation.  We discussed her hospital stay and workup.  She states that her dizziness has improved following medication adjustments.  Still feels relatively weak with lack of stamina, but this has been more of a chronic problem.  No obvious sense of palpitations or chest pain.  We did discuss diagnosis of atrial flutter and repeat ECG today shows atrial fibrillation with nonspecific ST changes.  CHA2DS2-VASc score is 7.  We discussed indication for anticoagulation at this point.  Physical Exam: VS:  BP (!) 146/70   Pulse 77   Ht 5\' 3"  (1.6 m)   Wt 210 lb 9.6 oz (95.5 kg)   SpO2 95%   BMI 37.31 kg/m , BMI Body mass index is 37.31 kg/m.  Wt Readings from Last 3 Encounters:  07/16/22 210 lb 9.6 oz (95.5 kg)  06/11/22 204 lb 1.9 oz (92.6 kg)  04/13/22 209 lb 12.8 oz (95.2 kg)    General: Patient appears comfortable at rest. HEENT: Conjunctiva and lids normal. Neck: Supple, no elevated JVP or carotid bruits. Lungs: Decreased breath sounds without wheezing, nonlabored breathing at  rest. Cardiac: Irregularly irregular, 2/6 systolic murmur, no gallop.. Extremities: No pitting edema.  ECG:  An ECG dated 03/14/2021 was personally reviewed today and demonstrated:  Sinus rhythm with old anterior infarct pattern.  Labwork: 09/12/2021: ALT 13; AST 30; BUN 16; Creatinine, Ser 1.27; Hemoglobin 13.7; Platelets 269; Potassium 5.9; Sodium 139; TSH 3.450     Component Value Date/Time   CHOL 137 09/12/2021 1529   TRIG 128 09/12/2021 1529   HDL 46 09/12/2021 1529   CHOLHDL 3.0 09/12/2021 1529   LDLCALC 68 09/12/2021 1529  February 2024: Potassium 3.4, BUN 25, creatinine 1.49, AST 9, ALT 14, hemoglobin 12.6, platelets 156, magnesium 1.8, high-sensitivity troponin I normal, hemoglobin A1c 5.8%, TSH 1.371  Other Studies Reviewed Today:  No interval cardiac testing for review today.  Assessment and Plan:  1.  Atypical atrial flutter documented by ECG during evaluation at [facility] in February.  CHA2DS2-VASc score is 7.  She does have a prior history of postoperative atrial fibrillation.  Not anticoagulated previously given resolution on amiodarone and no obvious recurrence with follow-up outpatient cardiac monitoring.  Follow-up ECG today is consistent with atrial fibrillation, heart rate controlled.  We discussed indications for stroke prophylaxis and we will plan to stop her Plavix and replace it with Eliquis 5 mg twice daily.  Follow-up CBC and BMET for next visit.  2.  Multivessel CAD status post CABG with LIMA to LAD, SVG to OM, SVG to diagonal, and SVG to PDA in 1995.  She  underwent DES to the circumflex in 2011 with most recent cardiac catheterization in December 2022 showing occluded SVG to OM, occluded SVG to diagonal, and occluded SVG to PDA with patency of LIMA to LAD.  There was also moderate in-stent restenosis in the circumflex/OM distribution that was managed medically.  LVEF was 61% by Leane Call in September 2022.  She does not report any active angina at this  time.  Will keep her on low-dose aspirin, otherwise continue Diovan, Pravachol, and as needed nitroglycerin.  She is no longer on Imdur or beta-blocker.  3.  Essential hypertension.  Continue observation on present regimen.  4.  Mixed hyperlipidemia.  LDL 68 in May of last year.  5.  History of mesenteric ischemia.  Disposition:  Follow up  2 months.  Signed, Satira Sark, M.D., F.A.C.C.

## 2022-07-16 ENCOUNTER — Encounter: Payer: Self-pay | Admitting: Cardiology

## 2022-07-16 ENCOUNTER — Ambulatory Visit: Payer: Medicare Other | Attending: Cardiology | Admitting: Cardiology

## 2022-07-16 VITALS — BP 146/70 | HR 77 | Ht 63.0 in | Wt 210.6 lb

## 2022-07-16 DIAGNOSIS — I484 Atypical atrial flutter: Secondary | ICD-10-CM

## 2022-07-16 DIAGNOSIS — I4819 Other persistent atrial fibrillation: Secondary | ICD-10-CM

## 2022-07-16 DIAGNOSIS — Z79899 Other long term (current) drug therapy: Secondary | ICD-10-CM

## 2022-07-16 DIAGNOSIS — I25119 Atherosclerotic heart disease of native coronary artery with unspecified angina pectoris: Secondary | ICD-10-CM | POA: Diagnosis not present

## 2022-07-16 DIAGNOSIS — I10 Essential (primary) hypertension: Secondary | ICD-10-CM | POA: Diagnosis not present

## 2022-07-16 MED ORDER — APIXABAN 5 MG PO TABS: 5.0000 mg | ORAL_TABLET | Freq: Two times a day (BID) | ORAL | 6 refills | Status: DC

## 2022-07-16 NOTE — Patient Instructions (Addendum)
Medication Instructions:  Your physician has recommended you make the following change in your medication:  Stop plavix (clopidogrel) Start eliquis 5 mg twice daily Continue other medications the same  Labwork: BMET & CBC in May 2024 just before your next visit. [place] [facility] [facility] or [facility]  Testing/Procedures: none  Follow-Up: Your physician recommends that you schedule a follow-up appointment in: 2 months  Any Other Special Instructions Will Be Listed Below (If Applicable).  If you need a refill on your cardiac medications before your next appointment, please call your pharmacy.

## 2022-07-17 ENCOUNTER — Encounter: Payer: Self-pay | Admitting: Internal Medicine

## 2022-07-17 ENCOUNTER — Ambulatory Visit (INDEPENDENT_AMBULATORY_CARE_PROVIDER_SITE_OTHER): Payer: Medicare Other | Admitting: Internal Medicine

## 2022-07-17 VITALS — BP 97/65 | HR 77 | Ht 63.0 in | Wt 208.4 lb

## 2022-07-17 DIAGNOSIS — J9611 Chronic respiratory failure with hypoxia: Secondary | ICD-10-CM

## 2022-07-17 DIAGNOSIS — J441 Chronic obstructive pulmonary disease with (acute) exacerbation: Secondary | ICD-10-CM

## 2022-07-17 DIAGNOSIS — K551 Chronic vascular disorders of intestine: Secondary | ICD-10-CM

## 2022-07-17 DIAGNOSIS — I4821 Permanent atrial fibrillation: Secondary | ICD-10-CM | POA: Diagnosis not present

## 2022-07-17 DIAGNOSIS — I739 Peripheral vascular disease, unspecified: Secondary | ICD-10-CM | POA: Diagnosis not present

## 2022-07-17 DIAGNOSIS — F331 Major depressive disorder, recurrent, moderate: Secondary | ICD-10-CM

## 2022-07-17 DIAGNOSIS — F411 Generalized anxiety disorder: Secondary | ICD-10-CM

## 2022-07-17 DIAGNOSIS — I10 Essential (primary) hypertension: Secondary | ICD-10-CM | POA: Diagnosis not present

## 2022-07-17 MED ORDER — PROMETHAZINE-DM 6.25-15 MG/5ML PO SYRP: 5.0000 mL | ORAL_SOLUTION | Freq: Four times a day (QID) | ORAL | 0 refills | Status: DC | PRN

## 2022-07-17 MED ORDER — METHYLPREDNISOLONE 4 MG PO TBPK: ORAL_TABLET | ORAL | 0 refills | Status: DC

## 2022-07-17 NOTE — Assessment & Plan Note (Signed)
Due to underlying COPD Uses Stiolto and as needed albuterol Uses O2 at nighttime, O2 sat drops below 88% at nighttime, new Rx was sent to [facility]

## 2022-07-17 NOTE — Progress Notes (Unsigned)
Established Patient Office Visit  Subjective:  Patient ID: [name], female    DOB: 1945-07-23  Age: 77 y.o. MRN: [id number]  CC:  Chief Complaint  Patient presents with   Hypertension    Follow up   Cough    Had a cough and now hurting in chest and lungs    HPI [name] is a 77 y.o. female with past medical history of hypertension, hyperlipidemia, mesenteric ischemia status post right hemicolectomy, coronary artery disease, hypothyroidism, type 2 diabetes mellitus, depression with anxiety, obesity, arthritis, chronic back pain who presents for f/u of her chronic medical conditions.  She was found to have atrial flutter during recent hospitalization.  She had EKG yesterday in cardiology office, which showed atrial fibrillation.  She was placed on Eliquis instead of Plavix.  HTN: BP is low normal.  She had a recent hospitalization for episode of syncope from hypotension.  She was given IV fluids, which improved her blood pressure.  She was also told to decrease the doses of her valsartan and torsemide, but it is unclear if she has been following it.  Her amlodipine was discontinued in the post discharge follow-up.  She had been taking valsartan 80 mg QD and metoprolol 25 mg twice daily.  She also takes Imdur 60 mg daily for CAD.  She still complains of mild headache and nausea.  Denies any dizziness, chest pain or palpitations.  MDD and GAD: She has anhedonia, insomnia, lack of interest in routine activities and agitation. Denies any SI or HI. She had stopped taking Zoloft as it was not helping. Later, she was placed on Celexa. She takes Xanax for anxiety and takes Ambien PRN for insomnia.  COPD: She has been using Stiolto and as needed albuterol, but does not use it regularly.  She uses home O2 at nighttime for dyspnea and hypoxia (O2 sat less than 88%). She reports worsening of dyspnea for the last 2 months. Denies any fever or chills. C/o chronic nasal congestion and  postnasal drip.      Past Medical History:  Diagnosis Date   Acute ischemic colitis ([place]) 09/11/2005   Anxiety    Arthritis    Atherosclerotic vascular disease    Calcified plaque at the origin of the celiac and SMA   CHF (congestive heart failure) (HCC)    Coronary atherosclerosis of native coronary artery    Mulitvessel LVEF 50-50%, DES circ 1/11, occluded SVG to OM and SVG to diagonal , LVEF 60%   COVID-19 virus infection 11/07/2020   Depression    Diverticulosis of colon    Essential hypertension    Hypothyroidism    Mesenteric ischemia (HCC)    Mitral regurgitation    Mild to moderate   Mixed hyperlipidemia    Myocardial infarction ([place]) 1990   Obesity    Orthostatic hypotension    PAD (peripheral artery disease) (HCC)    PVC's (premature ventricular contractions)    Schizophrenia (HCC)    Sleep apnea    CPAP   TIA (transient ischemic attack) 11/20/2017   Tubular adenoma    Type 2 diabetes mellitus (HCC)    Vascular complications of mesenteric artery     Past Surgical History:  Procedure Laterality Date   BACK SURGERY     Lumbar spine surgery   Carotid endarectomy Bilateral    CARPAL TUNNEL RELEASE     Bilateral   CATARACT EXTRACTION W/PHACO Right 08/24/2013   Procedure: CATARACT EXTRACTION PHACO AND INTRAOCULAR LENS PLACEMENT (  [place]);  Surgeon: Tonny Branch, MD;  Location: [facility];  Service: Ophthalmology;  Laterality: Right;  CDE 8.68   COLONOSCOPY  09/14/2005   Dr. Leonard Schwartz colitis   COLONOSCOPY WITH ESOPHAGOGASTRODUODENOSCOPY (EGD)  04/14/2012   Dr. Gala Romney- EGD= gastric erosions of doubtful clinical significance per bx- chronic inactive gastritis, benign small bowel mucosa. TCS=colonic diverticulosis, tubular adenoma   CORONARY ARTERY BYPASS GRAFT     LIMA-LAD; SVG-OM; SVG-DX in [employer]     Cervical laminectomy   PARTIAL HYSTERECTOMY     RIGHT/LEFT HEART CATH AND CORONARY/GRAFT ANGIOGRAPHY N/A 04/12/2021   Procedure: RIGHT/LEFT HEART  CATH AND CORONARY/GRAFT ANGIOGRAPHY;  Surgeon: Wellington Hampshire, MD;  Location: [facility];  Service: Cardiovascular;  Laterality: N/A;   stents x4      Family History  Problem Relation Age of Onset   Alcohol abuse Mother    Alcohol abuse Father    Coronary artery disease Other    Hypertension Other    Crohn's disease Sister 51   Stroke Daughter     Social History   Socioeconomic History   Marital status: Widowed    Spouse name: Not on file   Number of children: 4   Years of education: Not on file   Highest education level: Not on file  Occupational History   Occupation: DISABLED    Employer: UNEMPLOYED  Tobacco Use   Smoking status: Light Smoker    Packs/day: 0.50    Years: 50.00    Additional pack years: 0.00    Total pack years: 25.00    Types: Cigarettes    Start date: 12/30/2018    Passive exposure: Never   Smokeless tobacco: Never  Vaping Use   Vaping Use: Never used  Substance and Sexual Activity   Alcohol use: Yes    Alcohol/week: 0.0 standard drinks of alcohol    Comment: occasionally    Drug use: No   Sexual activity: Not on file  Other Topics Concern   Not on file  Social History Narrative   LIves alone   Social Determinants of Health   Financial Resource Strain: Low Risk  (02/27/2021)   Overall Financial Resource Strain (CARDIA)    Difficulty of Paying Living Expenses: Not hard at all  Food Insecurity: No Food Insecurity (02/27/2021)   Hunger Vital Sign    Worried About Running Out of Food in the Last Year: Never true    Ran Out of Food in the Last Year: Never true  Transportation Needs: No Transportation Needs (02/27/2021)   PRAPARE - Hydrologist (Medical): No    Lack of Transportation (Non-Medical): No  Physical Activity: Inactive (02/27/2021)   Exercise Vital Sign    Days of Exercise per Week: 0 days    Minutes of Exercise per Session: 0 min  Stress: No Stress Concern Present (02/27/2021)   [place]    Feeling of Stress : Only a little  Social Connections: Socially Isolated (02/27/2021)   Social Connection and Isolation Panel [NHANES]    Frequency of Communication with Friends and Family: More than three times a week    Frequency of Social Gatherings with Friends and Family: Never    Attends Religious Services: Never    Marine scientist or Organizations: No    Attends Archivist Meetings: Never    Marital Status: Widowed  Intimate Partner Violence: Not At Risk (02/27/2021)  Humiliation, Afraid, Rape, and Kick questionnaire    Fear of Current or Ex-Partner: No    Emotionally Abused: No    Physically Abused: No    Sexually Abused: No    Outpatient Medications Prior to Visit  Medication Sig Dispense Refill   albuterol (VENTOLIN HFA) 108 (90 Base) MCG/ACT inhaler Inhale 1 puff into the lungs every 6 (six) hours as needed for wheezing or shortness of breath.     ALPRAZolam (XANAX) 0.5 MG tablet Take 1 tablet (0.5 mg total) by mouth 2 (two) times daily as needed. for anxiety 60 tablet 3   apixaban (ELIQUIS) 5 MG TABS tablet Take 1 tablet (5 mg total) by mouth 2 (two) times daily. 60 tablet 6   aspirin EC 81 MG tablet Take 81 mg by mouth at bedtime.     Biotin 10000 MCG TABS Take 1 tablet by mouth daily.     blood glucose meter kit and supplies Dispense based on patient and insurance preference. Use up to four times daily as directed. (FOR ICD-10 E10.9, E11.9). 1 each 0   calcitRIOL (ROCALTROL) 0.25 MCG capsule Take 1 capsule by mouth 2 (two) times a week.     Cholecalciferol (VITAMIN D3) 50 MCG (2000 UT) TABS Take 1 tablet by mouth daily.     citalopram (CELEXA) 20 MG tablet Take 1 tablet (20 mg total) by mouth daily. 90 tablet 1   Collagen-Vitamin C (COLLAGEN PLUS VITAMIN C PO) Take 1 tablet by mouth daily.     dapagliflozin propanediol (FARXIGA) 5 MG TABS tablet Take 5 mg by mouth daily.      diphenoxylate-atropine (LOMOTIL) 2.5-0.025 MG tablet Take 1 tablet by mouth 2 (two) times daily as needed for diarrhea or loose stools.     famotidine (PEPCID) 40 MG tablet Take 1 tablet (40 mg total) by mouth daily as needed for heartburn or indigestion. 30 tablet 5   fluticasone (FLOVENT HFA) 110 MCG/ACT inhaler Inhale 2 puffs into the lungs 2 (two) times daily.     ipratropium (ATROVENT) 0.03 % nasal spray Place 2 sprays into both nostrils every 12 (twelve) hours. 30 mL 12   isosorbide mononitrate (IMDUR) 60 MG 24 hr tablet Take 60 mg by mouth daily.     levothyroxine (SYNTHROID) 50 MCG tablet Take 1 tablet (50 mcg total) by mouth daily before breakfast. 30 tablet 11   Melatonin 10 MG TABS Take 10 mg by mouth at bedtime as needed (sleep).     nitroGLYCERIN (NITROSTAT) 0.4 MG SL tablet Place 0.4 mg under the tongue every 5 (five) minutes x 3 doses as needed for chest pain.     ondansetron (ZOFRAN ODT) 4 MG disintegrating tablet Take 1 tablet (4 mg total) by mouth every 8 (eight) hours as needed for nausea or vomiting. 20 tablet 0   OneTouch Delica Lancets [id number] MISC 4 TIMES DAILY 100 each 5   ONETOUCH VERIO test strip 4 TIMES DAILY 100 each 5   pantoprazole (PROTONIX) 40 MG tablet Take 1 tablet (40 mg total) by mouth every morning. 90 tablet 1   pravastatin (PRAVACHOL) 80 MG tablet Take 1 tablet (80 mg total) by mouth daily. 90 tablet 3   Tiotropium Bromide-Olodaterol (STIOLTO RESPIMAT) 2.5-2.5 MCG/ACT AERS Inhale 2 puffs into the lungs daily. 4 g 11   torsemide (DEMADEX) 20 MG tablet Take 40 mg by mouth as needed.     valsartan (DIOVAN) 40 MG tablet Take 80 mg by mouth daily.     zolpidem (AMBIEN)  10 MG tablet TAKE 1 TABLET BY MOUTH AT BEDTIME AS NEEDED FOR SLEEP 30 tablet 3   sertraline (ZOLOFT) 50 MG tablet Take 50 mg by mouth daily.     Facility-Administered Medications Prior to Visit  Medication Dose Route Frequency Provider Last Rate Last Admin   sodium chloride flush (NS) 0.9 % injection  3 mL  3 mL Intravenous Q12H Satira Sark, MD        No Known Allergies  ROS Review of Systems  Constitutional:  Negative for chills and fever.  HENT:  Negative for congestion, sinus pressure and sinus pain.   Eyes:  Negative for pain and discharge.  Respiratory:  Positive for cough and shortness of breath.   Cardiovascular:  Negative for chest pain and palpitations.  Gastrointestinal:  Positive for abdominal pain and nausea. Negative for diarrhea and vomiting.  Genitourinary:  Negative for dysuria and hematuria.  Musculoskeletal:  Positive for arthralgias, back pain, gait problem and joint swelling.  Skin:        Bruising over UE and LE  Neurological:  Positive for weakness and headaches.  Psychiatric/Behavioral:  Positive for agitation and sleep disturbance. Negative for behavioral problems. The patient is nervous/anxious.       Objective:    Physical Exam Vitals reviewed.  Constitutional:      General: She is not in acute distress.    Appearance: She is obese. She is not diaphoretic.  HENT:     Head: Normocephalic and atraumatic.     Nose: Congestion present.     Mouth/Throat:     Mouth: Mucous membranes are moist.     Pharynx: No posterior oropharyngeal erythema.  Eyes:     General: No scleral icterus.    Extraocular Movements: Extraocular movements intact.  Cardiovascular:     Rate and Rhythm: Normal rate. Rhythm irregular.     Pulses: Normal pulses.     Heart sounds: Normal heart sounds. No murmur heard. Pulmonary:     Breath sounds: Normal breath sounds. No wheezing or rales.  Abdominal:     Palpations: Abdomen is soft.     Tenderness: There is no abdominal tenderness.  Musculoskeletal:     Cervical back: Normal range of motion and neck supple. No rigidity or tenderness.     Right lower leg: No edema.     Left lower leg: No edema.  Skin:    General: Skin is warm.     Findings: Bruising (Over b/l UE and LE) present.  Neurological:     General: No  focal deficit present.     Mental Status: She is alert and oriented to person, place, and time.     Sensory: No sensory deficit.     Motor: No weakness.     Gait: Gait abnormal (Likely in the setting of baseline hip pain).  Psychiatric:        Mood and Affect: Mood normal.        Behavior: Behavior normal.     BP 97/65   Pulse 77   Ht 5\' 3"  (1.6 m)   Wt 208 lb 6.4 oz (94.5 kg)   SpO2 90%   BMI 36.92 kg/m  Wt Readings from Last 3 Encounters:  07/17/22 208 lb 6.4 oz (94.5 kg)  07/16/22 210 lb 9.6 oz (95.5 kg)  06/11/22 204 lb 1.9 oz (92.6 kg)    Lab Results  Component Value Date   TSH 3.450 09/12/2021   Lab Results  Component Value Date  WBC 6.9 09/12/2021   HGB 13.7 09/12/2021   HCT 41.2 09/12/2021   MCV 95 09/12/2021   PLT 269 09/12/2021   Lab Results  Component Value Date   NA 139 09/12/2021   K 5.9 (H) 09/12/2021   CO2 25 09/12/2021   GLUCOSE 105 (H) 09/12/2021   BUN 16 09/12/2021   CREATININE 1.27 (H) 09/12/2021   BILITOT 0.7 09/12/2021   ALKPHOS 114 09/12/2021   AST 30 09/12/2021   ALT 13 09/12/2021   PROT 7.6 09/12/2021   ALBUMIN 4.4 09/12/2021   CALCIUM 10.2 09/12/2021   ANIONGAP 9 03/13/2021   EGFR 44 (L) 09/12/2021   Lab Results  Component Value Date   CHOL 137 09/12/2021   Lab Results  Component Value Date   HDL 46 09/12/2021   Lab Results  Component Value Date   LDLCALC 68 09/12/2021   Lab Results  Component Value Date   TRIG 128 09/12/2021   Lab Results  Component Value Date   CHOLHDL 3.0 09/12/2021   Lab Results  Component Value Date   HGBA1C 6.0 04/13/2022      Assessment & Plan:   Problem List Items Addressed This Visit       Cardiovascular and Mediastinum   Benign essential hypertension - Primary    BP Readings from Last 1 Encounters:  07/17/22 97/65  Overall very tightly controlled considering her age Amlodipine was discontinued recently Advised to decrease dose of Torsemide to 20 mg QD and Imdur to 30 mg  QD Needs to take valsartan only 40 mg daily Counseled for compliance with the medications Advised DASH diet      Relevant Medications   isosorbide mononitrate (IMDUR) 60 MG 24 hr tablet   Chronic mesenteric ischemia (HCC)    On DAPT currently Has had right hemicolectomy      Relevant Medications   isosorbide mononitrate (IMDUR) 60 MG 24 hr tablet   PAD (peripheral artery disease) (HCC)    On aspirin, Eliquis and statin Denies claudication symptoms currently      Relevant Medications   isosorbide mononitrate (IMDUR) 60 MG 24 hr tablet   Atrial fibrillation (HCC)    Recently diagnosed On Eliquis now Currently rate controlled, was on metoprolol, held recently due to hypotension Followed by Cardiology      Relevant Medications   isosorbide mononitrate (IMDUR) 60 MG 24 hr tablet     Respiratory   COPD (chronic obstructive pulmonary disease) (HCC)    Uncontrolled with Stiolto and PRN Albuterol Her technique of use is questionable, she has been educated how to use her Stiolto, but still compliance is questionable Needs to use maintenance inhalers regularly She would benefit from using nebulizer treatments, will try to send DuoNeb - need to check DME  Medrol Dosepak for acute exacerbation of COPD Promethazine DM syrup as needed for cough Needs to cut down -> quit smoking      Relevant Medications   methylPREDNISolone (MEDROL DOSEPAK) 4 MG TBPK tablet   promethazine-dextromethorphan (PROMETHAZINE-DM) 6.25-15 MG/5ML syrup   ipratropium-albuterol (DUONEB) 0.5-2.5 (3) MG/3ML SOLN   Chronic respiratory failure with hypoxia (HCC)    Due to underlying COPD Uses Stiolto and as needed albuterol Uses O2 at nighttime, O2 sat drops below 88% at nighttime, new Rx was sent to [place]        Other   MDD (major depressive disorder), recurrent episode, moderate ([place])    [place] Office Visit from 07/17/2022 in [facility]  PHQ-9  Total Score 17      Uncontrolled Had started Zoloft 50 mg daily, but she stopped taking it as it was not helping her Had switched to Celexa, increased dose to 20 mg QD recently Takes Ambien as needed for insomnia      GAD (generalized anxiety disorder)    Takes Xanax, sometimes twice daily - still has anhedonia and spells of anxiety, would avoid increasing dose of Xanax for now as she is also on Ambien Added Celexa for MDD and anxiety, increased dose recently Patient also takes Ambien for insomnia. She lives alone and could be a reason for her anxiety as well      Morbid obesity ([place])    BMI Readings from Last 3 Encounters:  07/17/22 36.92 kg/m  07/16/22 37.31 kg/m  06/11/22 36.16 kg/m  Associated with HTN, CAD, CKD and HLD Diet modification and moderate exercise/walking advised      Meds ordered this encounter  Medications   methylPREDNISolone (MEDROL DOSEPAK) 4 MG TBPK tablet    Sig: Take as package instructions.    Dispense:  1 each    Refill:  0   promethazine-dextromethorphan (PROMETHAZINE-DM) 6.25-15 MG/5ML syrup    Sig: Take 5 mLs by mouth 4 (four) times daily as needed for cough.    Dispense:  118 mL    Refill:  0   ipratropium-albuterol (DUONEB) 0.5-2.5 (3) MG/3ML SOLN    Sig: Take 3 mLs by nebulization 4 times daily and as needed for shortness of breath or wheezing.    Dispense:  450 mL    Refill:  11    Please dispense nebulizer machine as well.    Follow-up: Return in about 4 weeks (around 08/14/2022) for HTN and CKD.    Lindell Spar, MD

## 2022-07-17 NOTE — Assessment & Plan Note (Signed)
On aspirin, Eliquis and statin Denies claudication symptoms currently

## 2022-07-17 NOTE — Assessment & Plan Note (Signed)
[place] Office Visit from 07/17/2022 in [facility]  PHQ-9 Total Score 17      Uncontrolled Had started Zoloft 50 mg daily, but she stopped taking it as it was not helping her Had switched to Celexa, increased dose to 20 mg QD Takes Ambien as needed for insomnia

## 2022-07-17 NOTE — Assessment & Plan Note (Signed)
BMI Readings from Last 3 Encounters:  07/17/22 36.92 kg/m  07/16/22 37.31 kg/m  06/11/22 36.16 kg/m   Associated with HTN, CAD, CKD and HLD Diet modification and moderate exercise/walking advised

## 2022-07-17 NOTE — Assessment & Plan Note (Addendum)
Well-controlled with Stiolto and PRN Albuterol Needs to use maintenance inhalers regularly Needs to cut down -> quit smoking

## 2022-07-17 NOTE — Patient Instructions (Signed)
Please take Prednisone as prescribed.  Please take Promethazine-DM as needed for cough. Once you complete it, okay to take Mucinex as needed for cough.  Please take Imdur half tablet and Torsemide half table only.  Please bring your medications in the next visit.

## 2022-07-17 NOTE — Assessment & Plan Note (Signed)
BP Readings from Last 1 Encounters:  07/17/22 97/65   Overall very tightly controlled considering her age Amlodipine was discontinued recently Advised to decrease dose of Torsemide to 20 mg QD and Imdur to 30 mg QD Needs to take valsartan only 40 mg daily Counseled for compliance with the medications Advised DASH diet

## 2022-07-17 NOTE — Assessment & Plan Note (Signed)
On DAPT currently Has had right hemicolectomy

## 2022-07-19 DIAGNOSIS — I4891 Unspecified atrial fibrillation: Secondary | ICD-10-CM | POA: Insufficient documentation

## 2022-07-19 MED ORDER — IPRATROPIUM-ALBUTEROL 0.5-2.5 (3) MG/3ML IN SOLN: RESPIRATORY_TRACT | 11 refills | Status: DC

## 2022-07-19 NOTE — Addendum Note (Signed)
Addended byIhor Dow on: 07/19/2022 03:00 PM   Modules accepted: Orders

## 2022-07-19 NOTE — Assessment & Plan Note (Addendum)
Recently diagnosed On Eliquis now Currently rate controlled, was on metoprolol, held recently due to hypotension Followed by Cardiology

## 2022-07-19 NOTE — Assessment & Plan Note (Signed)
Takes Xanax, sometimes twice daily - still has anhedonia and spells of anxiety, would avoid increasing dose of Xanax for now as she is also on Ambien Added Celexa for MDD and anxiety, increased dose recently Patient also takes Ambien for insomnia. She lives alone and could be a reason for her anxiety as well

## 2022-07-24 ENCOUNTER — Telehealth: Payer: Self-pay | Admitting: Internal Medicine

## 2022-07-24 NOTE — Telephone Encounter (Signed)
Spoke to [employer]

## 2022-07-24 NOTE — Telephone Encounter (Signed)
Called in on patient behalf in regard to breathing treatment. Also wants to see if patient can get portable oxygen machine. Wants a call back.

## 2022-08-07 ENCOUNTER — Other Ambulatory Visit: Payer: Self-pay | Admitting: Internal Medicine

## 2022-08-07 DIAGNOSIS — N189 Chronic kidney disease, unspecified: Secondary | ICD-10-CM | POA: Diagnosis not present

## 2022-08-07 DIAGNOSIS — E1129 Type 2 diabetes mellitus with other diabetic kidney complication: Secondary | ICD-10-CM | POA: Diagnosis not present

## 2022-08-07 DIAGNOSIS — E1122 Type 2 diabetes mellitus with diabetic chronic kidney disease: Secondary | ICD-10-CM | POA: Diagnosis not present

## 2022-08-07 DIAGNOSIS — R809 Proteinuria, unspecified: Secondary | ICD-10-CM | POA: Diagnosis not present

## 2022-08-07 DIAGNOSIS — E211 Secondary hyperparathyroidism, not elsewhere classified: Secondary | ICD-10-CM | POA: Diagnosis not present

## 2022-08-08 ENCOUNTER — Telehealth: Payer: Self-pay | Admitting: Internal Medicine

## 2022-08-08 ENCOUNTER — Other Ambulatory Visit: Payer: Self-pay

## 2022-08-08 DIAGNOSIS — J441 Chronic obstructive pulmonary disease with (acute) exacerbation: Secondary | ICD-10-CM

## 2022-08-08 MED ORDER — IPRATROPIUM-ALBUTEROL 0.5-2.5 (3) MG/3ML IN SOLN: RESPIRATORY_TRACT | 11 refills | Status: AC

## 2022-08-08 NOTE — Telephone Encounter (Signed)
Patient granddaughter  [name] called still waiting on a nebulizer. Was told coming from company from [place] but does not have all the information to get it.  Patient needs to know where this nebulizer is so she can get this machine. Please contact patient [phone]

## 2022-08-08 NOTE — Telephone Encounter (Signed)
Morrie Sheldon to fax over paperwork for dr patel to sign, Morrie Sheldon will contact patient for update

## 2022-08-10 DIAGNOSIS — J449 Chronic obstructive pulmonary disease, unspecified: Secondary | ICD-10-CM | POA: Diagnosis not present

## 2022-08-13 ENCOUNTER — Ambulatory Visit (INDEPENDENT_AMBULATORY_CARE_PROVIDER_SITE_OTHER): Payer: Medicare Other | Admitting: Internal Medicine

## 2022-08-13 ENCOUNTER — Other Ambulatory Visit: Payer: Self-pay | Admitting: Internal Medicine

## 2022-08-13 ENCOUNTER — Encounter: Payer: Self-pay | Admitting: Internal Medicine

## 2022-08-13 VITALS — BP 121/57 | HR 84 | Ht 63.0 in | Wt 206.8 lb

## 2022-08-13 DIAGNOSIS — F411 Generalized anxiety disorder: Secondary | ICD-10-CM

## 2022-08-13 DIAGNOSIS — K551 Chronic vascular disorders of intestine: Secondary | ICD-10-CM

## 2022-08-13 DIAGNOSIS — N1832 Chronic kidney disease, stage 3b: Secondary | ICD-10-CM

## 2022-08-13 DIAGNOSIS — I4821 Permanent atrial fibrillation: Secondary | ICD-10-CM | POA: Diagnosis not present

## 2022-08-13 DIAGNOSIS — I5020 Unspecified systolic (congestive) heart failure: Secondary | ICD-10-CM

## 2022-08-13 DIAGNOSIS — N1831 Chronic kidney disease, stage 3a: Secondary | ICD-10-CM

## 2022-08-13 DIAGNOSIS — Z0000 Encounter for general adult medical examination without abnormal findings: Secondary | ICD-10-CM

## 2022-08-13 DIAGNOSIS — F331 Major depressive disorder, recurrent, moderate: Secondary | ICD-10-CM

## 2022-08-13 DIAGNOSIS — E782 Mixed hyperlipidemia: Secondary | ICD-10-CM | POA: Diagnosis not present

## 2022-08-13 DIAGNOSIS — E1122 Type 2 diabetes mellitus with diabetic chronic kidney disease: Secondary | ICD-10-CM | POA: Diagnosis not present

## 2022-08-13 DIAGNOSIS — I10 Essential (primary) hypertension: Secondary | ICD-10-CM | POA: Diagnosis not present

## 2022-08-13 DIAGNOSIS — J449 Chronic obstructive pulmonary disease, unspecified: Secondary | ICD-10-CM

## 2022-08-13 DIAGNOSIS — E039 Hypothyroidism, unspecified: Secondary | ICD-10-CM | POA: Diagnosis not present

## 2022-08-13 MED ORDER — TORSEMIDE 20 MG PO TABS: 20.0000 mg | ORAL_TABLET | Freq: Every day | ORAL | 5 refills | Status: DC

## 2022-08-13 NOTE — Progress Notes (Signed)
Subjective:   [name] is a 77 y.o. female who presents for Medicare Annual (Subsequent) preventive examination.  Review of Systems    Review of Systems  All other systems reviewed and are negative.   Objective:    There were no vitals filed for this visit. There is no height or weight on file to calculate BMI.     08/13/2022    1:52 PM 04/12/2021   10:53 AM 03/13/2021    8:57 PM 02/27/2021   10:20 AM 09/27/2020    4:54 PM 05/07/2020   12:38 PM 09/29/2019    2:22 PM  Advanced Directives  Does Patient Have a Medical Advance Directive? No No No No No No No  Would patient like information on creating a medical advance directive? Yes (MAU/Ambulatory/Procedural Areas - Information given) No - Patient declined  No - Patient declined No - Patient declined  No - Patient declined    Current Medications (verified) Outpatient Encounter Medications as of 08/13/2022  Medication Sig   albuterol (VENTOLIN HFA) 108 (90 Base) MCG/ACT inhaler Inhale 1 puff into the lungs every 6 (six) hours as needed for wheezing or shortness of breath.   ALPRAZolam (XANAX) 0.5 MG tablet Take 1 tablet (0.5 mg total) by mouth 2 (two) times daily as needed. for anxiety   apixaban (ELIQUIS) 5 MG TABS tablet Take 1 tablet (5 mg total) by mouth 2 (two) times daily.   aspirin EC 81 MG tablet Take 81 mg by mouth at bedtime.   Biotin 16109 MCG TABS Take 1 tablet by mouth daily.   blood glucose meter kit and supplies Dispense based on patient and insurance preference. Use up to four times daily as directed. (FOR ICD-10 E10.9, E11.9).   calcitRIOL (ROCALTROL) 0.25 MCG capsule Take 1 capsule by mouth 2 (two) times a week.   Cholecalciferol (VITAMIN D3) 50 MCG (2000 UT) TABS Take 1 tablet by mouth daily.   citalopram (CELEXA) 20 MG tablet TAKE ONE (1) TABLET BY MOUTH EVERY DAY   Collagen-Vitamin C (COLLAGEN PLUS VITAMIN C PO) Take 1 tablet by mouth daily.   dapagliflozin propanediol (FARXIGA) 5 MG TABS tablet Take 5  mg by mouth daily.   diphenoxylate-atropine (LOMOTIL) 2.5-0.025 MG tablet Take 1 tablet by mouth 2 (two) times daily as needed for diarrhea or loose stools.   famotidine (PEPCID) 40 MG tablet Take 1 tablet (40 mg total) by mouth daily as needed for heartburn or indigestion.   fluticasone (FLOVENT HFA) 110 MCG/ACT inhaler Inhale 2 puffs into the lungs 2 (two) times daily.   ipratropium (ATROVENT) 0.03 % nasal spray Place 2 sprays into both nostrils every 12 (twelve) hours.   ipratropium-albuterol (DUONEB) 0.5-2.5 (3) MG/3ML SOLN Take 3 mLs by nebulization 4 times daily and as needed for shortness of breath or wheezing.   isosorbide mononitrate (IMDUR) 60 MG 24 hr tablet Take 60 mg by mouth daily.   levothyroxine (SYNTHROID) 50 MCG tablet Take 1 tablet (50 mcg total) by mouth daily before breakfast.   Melatonin 10 MG TABS Take 10 mg by mouth at bedtime as needed (sleep).   nitroGLYCERIN (NITROSTAT) 0.4 MG SL tablet Place 0.4 mg under the tongue every 5 (five) minutes x 3 doses as needed for chest pain.   ondansetron (ZOFRAN ODT) 4 MG disintegrating tablet Take 1 tablet (4 mg total) by mouth every 8 (eight) hours as needed for nausea or vomiting.   OneTouch Delica Lancets 33G MISC 4 TIMES DAILY   ONETOUCH VERIO  test strip 4 TIMES DAILY   pantoprazole (PROTONIX) 40 MG tablet Take 1 tablet (40 mg total) by mouth every morning.   pravastatin (PRAVACHOL) 80 MG tablet Take 1 tablet (80 mg total) by mouth daily.   promethazine-dextromethorphan (PROMETHAZINE-DM) 6.25-15 MG/5ML syrup Take 5 mLs by mouth 4 (four) times daily as needed for cough.   Tiotropium Bromide-Olodaterol (STIOLTO RESPIMAT) 2.5-2.5 MCG/ACT AERS Inhale 2 puffs into the lungs daily.   valsartan (DIOVAN) 40 MG tablet Take 80 mg by mouth daily.   zolpidem (AMBIEN) 10 MG tablet TAKE 1 TABLET BY MOUTH AT BEDTIME AS NEEDED FOR SLEEP   [DISCONTINUED] citalopram (CELEXA) 20 MG tablet Take 1 tablet (20 mg total) by mouth daily.   [DISCONTINUED]  methylPREDNISolone (MEDROL DOSEPAK) 4 MG TBPK tablet Take as package instructions.   [DISCONTINUED] torsemide (DEMADEX) 20 MG tablet Take 40 mg by mouth as needed.   Facility-Administered Encounter Medications as of 08/13/2022  Medication   sodium chloride flush (NS) 0.9 % injection 3 mL    Allergies (verified) Patient has no known allergies.   History: Past Medical History:  Diagnosis Date   Acute ischemic colitis 09/11/2005   Anxiety    Arthritis    Atherosclerotic vascular disease    Calcified plaque at the origin of the celiac and SMA   CHF (congestive heart failure)    Coronary atherosclerosis of native coronary artery    Mulitvessel LVEF 50-50%, DES circ 1/11, occluded SVG to OM and SVG to diagonal , LVEF 60%   COVID-19 virus infection 11/07/2020   Depression    Diverticulosis of colon    Essential hypertension    Hypothyroidism    Mesenteric ischemia    Mitral regurgitation    Mild to moderate   Mixed hyperlipidemia    Myocardial infarction 1990   Obesity    Orthostatic hypotension    PAD (peripheral artery disease)    PVC's (premature ventricular contractions)    Schizophrenia    Sleep apnea    CPAP   TIA (transient ischemic attack) 11/20/2017   Tubular adenoma    Type 2 diabetes mellitus    Vascular complications of mesenteric artery    Past Surgical History:  Procedure Laterality Date   BACK SURGERY     Lumbar spine surgery   Carotid endarectomy Bilateral    CARPAL TUNNEL RELEASE     Bilateral   CATARACT EXTRACTION W/PHACO Right 08/24/2013   Procedure: CATARACT EXTRACTION PHACO AND INTRAOCULAR LENS PLACEMENT (IOC);  Surgeon: Gemma Payor, MD;  Location: [facility];  Service: Ophthalmology;  Laterality: Right;  CDE 8.68   COLONOSCOPY  09/14/2005   Dr. Jacqlyn Larsen colitis   COLONOSCOPY WITH ESOPHAGOGASTRODUODENOSCOPY (EGD)  04/14/2012   Dr. Jena Gauss- EGD= gastric erosions of doubtful clinical significance per bx- chronic inactive gastritis, benign small bowel  mucosa. TCS=colonic diverticulosis, tubular adenoma   CORONARY ARTERY BYPASS GRAFT     LIMA-LAD; SVG-OM; SVG-DX in [facility]   NECK SURGERY     Cervical laminectomy   PARTIAL HYSTERECTOMY     RIGHT/LEFT HEART CATH AND CORONARY/GRAFT ANGIOGRAPHY N/A 04/12/2021   Procedure: RIGHT/LEFT HEART CATH AND CORONARY/GRAFT ANGIOGRAPHY;  Surgeon: Iran Ouch, MD;  Location: [facility];  Service: Cardiovascular;  Laterality: N/A;   stents x4     Family History  Problem Relation Age of Onset   Alcohol abuse Mother    Alcohol abuse Father    Coronary artery disease Other    Hypertension Other    Crohn's disease Sister  60   Stroke Daughter    Social History   Socioeconomic History   Marital status: Widowed    Spouse name: Not on file   Number of children: 4   Years of education: Not on file   Highest education level: Not on file  Occupational History   Occupation: DISABLED    Employer: UNEMPLOYED  Tobacco Use   Smoking status: Light Smoker    Packs/day: 0.50    Years: 50.00    Additional pack years: 0.00    Total pack years: 25.00    Types: Cigarettes    Start date: 12/30/2018    Passive exposure: Never   Smokeless tobacco: Never  Vaping Use   Vaping Use: Never used  Substance and Sexual Activity   Alcohol use: Yes    Alcohol/week: 0.0 standard drinks of alcohol    Comment: occasionally    Drug use: No   Sexual activity: Not on file  Other Topics Concern   Not on file  Social History Narrative   LIves alone   Social Determinants of Health   Financial Resource Strain: Low Risk  (02/27/2021)   Overall Financial Resource Strain (CARDIA)    Difficulty of Paying Living Expenses: Not hard at all  Food Insecurity: No Food Insecurity (02/27/2021)   Hunger Vital Sign    Worried About Running Out of Food in the Last Year: Never true    Ran Out of Food in the Last Year: Never true  Transportation Needs: No Transportation Needs (02/27/2021)   PRAPARE - Therapist, art (Medical): No    Lack of Transportation (Non-Medical): No  Physical Activity: Inactive (02/27/2021)   Exercise Vital Sign    Days of Exercise per Week: 0 days    Minutes of Exercise per Session: 0 min  Stress: No Stress Concern Present (02/27/2021)   [employer] of Occupational Health - Occupational Stress Questionnaire    Feeling of Stress : Only a little  Social Connections: Socially Isolated (02/27/2021)   Social Connection and Isolation Panel [NHANES]    Frequency of Communication with Friends and Family: More than three times a week    Frequency of Social Gatherings with Friends and Family: Never    Attends Religious Services: Never    Database administrator or Organizations: No    Attends Banker Meetings: Never    Marital Status: Widowed    Tobacco Counseling Ready to quit: Not Answered Counseling given: Not Answered   Clinical Intake:  Pre-visit preparation completed: Yes  Pain : No/denies pain     Diabetes: Yes CBG done?: No Did pt. bring in CBG monitor from home?: No  How often do you need to have someone help you when you read instructions, pamphlets, or other written materials from your doctor or pharmacy?: 1 - Never What is the last grade level you completed in school?: GED    Activities of Daily Living    08/13/2022    2:00 PM  In your present state of health, do you have any difficulty performing the following activities:  Hearing? 0  Vision? 0  Difficulty concentrating or making decisions? 0  Walking or climbing stairs? 1  Dressing or bathing? 0  Doing errands, shopping? 0    Patient Care Team: Anabel Halon, MD as PCP - General (Internal Medicine) Jonelle Sidle, MD as PCP - Cardiology (Cardiology) Jena Gauss Gerrit Friends, MD as Attending Physician (Gastroenterology)  Indicate any recent Medical Services you  may have received from other than [facility] providers in the past year (date may be  approximate).     Assessment:   This is a routine wellness examination for [name].  Hearing/Vision screen No results found.  Dietary issues and exercise activities discussed:     Goals Addressed   None    Depression Screen    08/13/2022    1:59 PM 07/17/2022    2:15 PM 06/11/2022    1:32 PM 04/13/2022    9:44 AM 12/13/2021    9:44 AM 09/12/2021    1:11 PM 08/25/2021   11:13 AM  PHQ 2/9 Scores  PHQ - 2 Score 0  PHQ- 9 Score  0    Fall Risk    08/13/2022    1:59 PM 08/13/2022    1:58 PM 08/13/2022    1:10 PM 07/17/2022    2:15 PM 06/11/2022    1:32 PM  Fall Risk   Falls in the past year?  1 0 1 1  Number falls in past yr:  0 0 1 1  Injury with Fall?  1 0 1 1  Comment 3 mnths ago . Hit head, did not go to ED.      Risk for fall due to :    Impaired balance/gait;Impaired mobility Impaired balance/gait;Impaired mobility  Follow up    Falls evaluation completed Falls evaluation completed    FALL RISK PREVENTION PERTAINING TO THE HOME:  Any stairs in or around the home? Yes  If so, are there any without handrails? Yes  Home free of loose throw rugs in walkways, pet beds, electrical cords, etc? Yes  Adequate lighting in your home to reduce risk of falls? Yes   ASSISTIVE DEVICES UTILIZED TO PREVENT FALLS:  Life alert? No  Use of a cane, walker or w/c? Yes  Grab bars in the bathroom? Yes  Shower chair or bench in shower? Yes  Elevated toilet seat or a handicapped toilet? No     Cognitive Function:    02/27/2021   10:22 AM  MMSE - Mini Mental State Exam  Not completed: Unable to complete        08/13/2022    2:01 PM 02/27/2021   10:22 AM  6CIT Screen  What Year? 0 points 0 points  What month? 0 points 0 points  What time? 0 points 0 points  Count back from 20 0 points 0 points  Months in reverse 0 points 0 points  Repeat phrase 0 points 0 points  Total Score 0 points 0 points    Immunizations Immunization History   Administered Date(s) Administered   Fluad Quad(high Dose 65+) 02/09/2020, 04/13/2022   Influenza Split 04/10/2010   Influenza-Unspecified 03/06/2021   Moderna Sars-Covid-2 Vaccination 12/15/2019, 02/09/2020   PNEUMOCOCCAL CONJUGATE-20 03/10/2021    TDAP status: Due, Education has been provided regarding the importance of this vaccine. Advised may receive this vaccine at local pharmacy or Health Dept. Aware to provide a copy of the vaccination record if obtained from local pharmacy or Health Dept. Verbalized acceptance and understanding.  Flu Vaccine status: Up to date  Pneumococcal vaccine status: Up to date  Covid-19 vaccine status: Information provided on how to obtain vaccines.   Qualifies for Shingles Vaccine? Yes   Zostavax completed No   Shingrix Completed?: No.    Education has been provided regarding the importance of this vaccine. Patient has been advised to call insurance company to  determine out of pocket expense if they have not yet received this vaccine. Advised may also receive vaccine at local pharmacy or Health Dept. Verbalized acceptance and understanding.  Screening Tests Health Maintenance  Topic Date Due   OPHTHALMOLOGY EXAM  Never done   DTaP/Tdap/Td (1 - Tdap) Never done   Zoster Vaccines- Shingrix (1 of 2) Never done   Lung Cancer Screening  Never done   DEXA SCAN  Never done   COVID-19 Vaccine (3 - Moderna risk series) 03/08/2020   Medicare Annual Wellness (AWV)  02/27/2022   Diabetic kidney evaluation - eGFR measurement  09/13/2022   Diabetic kidney evaluation - Urine ACR  09/13/2022   HEMOGLOBIN A1C  10/13/2022   INFLUENZA VACCINE  11/29/2022   FOOT EXAM  12/14/2022   Pneumonia Vaccine 66+ Years old  Completed   Hepatitis C Screening  Completed   HPV VACCINES  Aged Out   COLONOSCOPY (Pts 45-38yrs Insurance coverage will need to be confirmed)  Discontinued    Health Maintenance  Health Maintenance Due  Topic Date Due   OPHTHALMOLOGY EXAM  Never  done   DTaP/Tdap/Td (1 - Tdap) Never done   Zoster Vaccines- Shingrix (1 of 2) Never done   Lung Cancer Screening  Never done   DEXA SCAN  Never done   COVID-19 Vaccine (3 - Moderna risk series) 03/08/2020   Medicare Annual Wellness (AWV)  02/27/2022    Colorectal cancer screening: No longer required.   Mammogram status: No longer required due to age.  Bone Density status: Declined  Lung Cancer Screening: (Low Dose CT Chest recommended if Age 54-80 years, 30 pack-year currently smoking OR have quit w/in 15years.) does not qualify.   Additional Screening:  Hepatitis C Screening: does not qualify; Completed 08/03/2020  Vision Screening: Recommended annual ophthalmology exams for early detection of glaucoma and other disorders of the eye. Is the patient up to date with their annual eye exam?  No  Who is the provider or what is the name of the office in which the patient attends annual eye exams? N/a If pt is not established with a provider, would they like to be referred to a provider to establish care? No . Her daughter will take her to [facility] in [place]  Dental Screening: Recommended annual dental exams for proper oral hygiene  Community Resource Referral / Chronic Care Management: CRR required this visit?  No   CCM required this visit?  No      Plan:     I have personally reviewed and noted the following in the patient's chart:   Medical and social history Use of alcohol, tobacco or illicit drugs  Current medications and supplements including opioid prescriptions. Patient is not currently taking opioid prescriptions. Functional ability and status Nutritional status Physical activity Advanced directives List of other physicians Hospitalizations, surgeries, and ER visits in previous 12 months Vitals Screenings to include cognitive, depression, and falls Referrals and appointments  In addition, I have reviewed and discussed with patient certain preventive protocols,  quality metrics, and best practice recommendations. A written personalized care plan for preventive services as well as general preventive health recommendations were provided to patient.     Milus Banister, MD   08/13/2022

## 2022-08-13 NOTE — Patient Instructions (Signed)
Please take Torsemide 1 tablet once daily.  Please continue to take other medications as prescribed.  Please continue to follow low carb and low salt diet and ambulate as tolerated.

## 2022-08-13 NOTE — Patient Instructions (Signed)
  [name] , Thank you for taking time to come for your Medicare Wellness Visit. I appreciate your ongoing commitment to your health goals. Please review the following plan we discussed and let me know if I can assist you in the future.   These are the goals we discussed: Patient would like to get more active. She has multiple medical conditions and will continue to work with PCP to treat these.   This is a list of the screening recommended for you and due dates:  Health Maintenance  Topic Date Due   Eye exam for diabetics  Never done   DTaP/Tdap/Td vaccine (1 - Tdap) Never done   Zoster (Shingles) Vaccine (1 of 2) Never done   Screening for Lung Cancer  Never done   DEXA scan (bone density measurement)  Never done   COVID-19 Vaccine (3 - Moderna risk series) 03/08/2020   Medicare Annual Wellness Visit  02/27/2022   Yearly kidney function blood test for diabetes  09/13/2022   Yearly kidney health urinalysis for diabetes  09/13/2022   Hemoglobin A1C  10/13/2022   Flu Shot  11/29/2022   Complete foot exam   12/14/2022   Pneumonia Vaccine  Completed   Hepatitis C Screening: USPSTF Recommendation to screen - Ages 18-79 yo.  Completed   HPV Vaccine  Aged Out   Colon Cancer Screening  Discontinued

## 2022-08-13 NOTE — Progress Notes (Signed)
Established Patient Office Visit  Subjective:  Patient ID: [name], female    DOB: 15-Jul-1945  Age: 77 y.o. MRN: [id number]  CC:  Chief Complaint  Patient presents with   COPD    Follow up for COPD and GAD. Discuss changing eliquis patient states since started taking it she stays tired all the time.    HPI [name] is a 77 y.o. female with past medical history of hypertension, hyperlipidemia, mesenteric ischemia status post right hemicolectomy, coronary artery disease, hypothyroidism, type 2 diabetes mellitus, depression with anxiety, obesity, arthritis, chronic back pain who presents for f/u of her chronic medical conditions.  She was found to have atrial flutter during recent hospitalization.  She had EKG yesterday in cardiology office, which showed atrial fibrillation.  She was placed on Eliquis instead of Plavix.  She reports worsening of her fatigue since starting Eliquis, but reports that she will continue it as it is helping her with fluttering sensation (?). Denies any active signs of bleeding.  HTN: BP is WNL today. She has been taking valsartan 40 mg QD.  She also takes Imdur 60 mg daily for CAD.  She still complains of mild headache and nausea. Denies any dizziness, chest pain or palpitations.  She complains of chronic dyspnea and recent worsening of leg swelling.  She has torsemide, but has not been taking it recently.  MDD and GAD: She has anhedonia, insomnia, lack of interest in routine activities and agitation, but is improving slowly. Denies any SI or HI. She was placed on Celexa, which has helped her with MDD. She takes Xanax for anxiety and takes Ambien PRN for insomnia.  COPD: She has been using Stiolto and as needed albuterol, but does not use it regularly.  She uses home O2 at nighttime for dyspnea and hypoxia (O2 sat less than 88%). She reports worsening of dyspnea for the last 2 months. Denies any fever or chills. C/o chronic nasal congestion and  postnasal drip.      Past Medical History:  Diagnosis Date   Acute ischemic colitis 09/11/2005   Anxiety    Arthritis    Atherosclerotic vascular disease    Calcified plaque at the origin of the celiac and SMA   CHF (congestive heart failure)    Coronary atherosclerosis of native coronary artery    Mulitvessel LVEF 50-50%, DES circ 1/11, occluded SVG to OM and SVG to diagonal , LVEF 60%   COVID-19 virus infection 11/07/2020   Depression    Diverticulosis of colon    Essential hypertension    Hypothyroidism    Mesenteric ischemia    Mitral regurgitation    Mild to moderate   Mixed hyperlipidemia    Myocardial infarction 1990   Obesity    Orthostatic hypotension    PAD (peripheral artery disease)    PVC's (premature ventricular contractions)    Schizophrenia    Sleep apnea    CPAP   TIA (transient ischemic attack) 11/20/2017   Tubular adenoma    Type 2 diabetes mellitus    Vascular complications of mesenteric artery     Past Surgical History:  Procedure Laterality Date   BACK SURGERY     Lumbar spine surgery   Carotid endarectomy Bilateral    CARPAL TUNNEL RELEASE     Bilateral   CATARACT EXTRACTION W/PHACO Right 08/24/2013   Procedure: CATARACT EXTRACTION PHACO AND INTRAOCULAR LENS PLACEMENT (IOC);  Surgeon: Gemma Payor, MD;  Location: [facility];  Service: Ophthalmology;  Laterality:  Right;  CDE 8.68   COLONOSCOPY  09/14/2005   Dr. Jacqlyn Larsen colitis   COLONOSCOPY WITH ESOPHAGOGASTRODUODENOSCOPY (EGD)  04/14/2012   Dr. Jena Gauss- EGD= gastric erosions of doubtful clinical significance per bx- chronic inactive gastritis, benign small bowel mucosa. TCS=colonic diverticulosis, tubular adenoma   CORONARY ARTERY BYPASS GRAFT     LIMA-LAD; SVG-OM; SVG-DX in 1995 [facility]   NECK SURGERY     Cervical laminectomy   PARTIAL HYSTERECTOMY     RIGHT/LEFT HEART CATH AND CORONARY/GRAFT ANGIOGRAPHY N/A 04/12/2021   Procedure: RIGHT/LEFT HEART CATH AND CORONARY/GRAFT ANGIOGRAPHY;   Surgeon: Iran Ouch, MD;  Location: [facility];  Service: Cardiovascular;  Laterality: N/A;   stents x4      Family History  Problem Relation Age of Onset   Alcohol abuse Mother    Alcohol abuse Father    Coronary artery disease Other    Hypertension Other    Crohn's disease Sister 67   Stroke Daughter     Social History   Socioeconomic History   Marital status: Widowed    Spouse name: Not on file   Number of children: 4   Years of education: Not on file   Highest education level: Not on file  Occupational History   Occupation: DISABLED    Employer: UNEMPLOYED  Tobacco Use   Smoking status: Light Smoker    Packs/day: 0.50    Years: 50.00    Additional pack years: 0.00    Total pack years: 25.00    Types: Cigarettes    Start date: 12/30/2018    Passive exposure: Never   Smokeless tobacco: Never  Vaping Use   Vaping Use: Never used  Substance and Sexual Activity   Alcohol use: Yes    Alcohol/week: 0.0 standard drinks of alcohol    Comment: occasionally    Drug use: No   Sexual activity: Not on file  Other Topics Concern   Not on file  Social History Narrative   LIves alone   Social Determinants of Health   Financial Resource Strain: Low Risk  (02/27/2021)   Overall Financial Resource Strain (CARDIA)    Difficulty of Paying Living Expenses: Not hard at all  Food Insecurity: No Food Insecurity (02/27/2021)   Hunger Vital Sign    Worried About Running Out of Food in the Last Year: Never true    Ran Out of Food in the Last Year: Never true  Transportation Needs: No Transportation Needs (02/27/2021)   PRAPARE - Administrator, Civil Service (Medical): No    Lack of Transportation (Non-Medical): No  Physical Activity: Inactive (02/27/2021)   Exercise Vital Sign    Days of Exercise per Week: 0 days    Minutes of Exercise per Session: 0 min  Stress: No Stress Concern Present (02/27/2021)   [employer] of Occupational Health -  Occupational Stress Questionnaire    Feeling of Stress : Only a little  Social Connections: Socially Isolated (02/27/2021)   Social Connection and Isolation Panel [NHANES]    Frequency of Communication with Friends and Family: More than three times a week    Frequency of Social Gatherings with Friends and Family: Never    Attends Religious Services: Never    Database administrator or Organizations: No    Attends Banker Meetings: Never    Marital Status: Widowed  Intimate Partner Violence: Not At Risk (02/27/2021)   Humiliation, Afraid, Rape, and Kick questionnaire    Fear of Current or  Ex-Partner: No    Emotionally Abused: No    Physically Abused: No    Sexually Abused: No    Outpatient Medications Prior to Visit  Medication Sig Dispense Refill   albuterol (VENTOLIN HFA) 108 (90 Base) MCG/ACT inhaler Inhale 1 puff into the lungs every 6 (six) hours as needed for wheezing or shortness of breath.     ALPRAZolam (XANAX) 0.5 MG tablet Take 1 tablet (0.5 mg total) by mouth 2 (two) times daily as needed. for anxiety 60 tablet 3   apixaban (ELIQUIS) 5 MG TABS tablet Take 1 tablet (5 mg total) by mouth 2 (two) times daily. 60 tablet 6   aspirin EC 81 MG tablet Take 81 mg by mouth at bedtime.     Biotin 95621 MCG TABS Take 1 tablet by mouth daily.     blood glucose meter kit and supplies Dispense based on patient and insurance preference. Use up to four times daily as directed. (FOR ICD-10 E10.9, E11.9). 1 each 0   calcitRIOL (ROCALTROL) 0.25 MCG capsule Take 1 capsule by mouth 2 (two) times a week.     Cholecalciferol (VITAMIN D3) 50 MCG (2000 UT) TABS Take 1 tablet by mouth daily.     citalopram (CELEXA) 20 MG tablet TAKE ONE (1) TABLET BY MOUTH EVERY DAY 90 tablet 1   Collagen-Vitamin C (COLLAGEN PLUS VITAMIN C PO) Take 1 tablet by mouth daily.     dapagliflozin propanediol (FARXIGA) 5 MG TABS tablet Take 5 mg by mouth daily.     diphenoxylate-atropine (LOMOTIL) 2.5-0.025 MG  tablet Take 1 tablet by mouth 2 (two) times daily as needed for diarrhea or loose stools.     famotidine (PEPCID) 40 MG tablet Take 1 tablet (40 mg total) by mouth daily as needed for heartburn or indigestion. 30 tablet 5   fluticasone (FLOVENT HFA) 110 MCG/ACT inhaler Inhale 2 puffs into the lungs 2 (two) times daily.     ipratropium (ATROVENT) 0.03 % nasal spray Place 2 sprays into both nostrils every 12 (twelve) hours. 30 mL 12   ipratropium-albuterol (DUONEB) 0.5-2.5 (3) MG/3ML SOLN Take 3 mLs by nebulization 4 times daily and as needed for shortness of breath or wheezing. 450 mL 11   isosorbide mononitrate (IMDUR) 60 MG 24 hr tablet Take 60 mg by mouth daily.     levothyroxine (SYNTHROID) 50 MCG tablet Take 1 tablet (50 mcg total) by mouth daily before breakfast. 30 tablet 11   Melatonin 10 MG TABS Take 10 mg by mouth at bedtime as needed (sleep).     nitroGLYCERIN (NITROSTAT) 0.4 MG SL tablet Place 0.4 mg under the tongue every 5 (five) minutes x 3 doses as needed for chest pain.     ondansetron (ZOFRAN ODT) 4 MG disintegrating tablet Take 1 tablet (4 mg total) by mouth every 8 (eight) hours as needed for nausea or vomiting. 20 tablet 0   OneTouch Delica Lancets 33G MISC 4 TIMES DAILY 100 each 5   ONETOUCH VERIO test strip 4 TIMES DAILY 100 each 5   pantoprazole (PROTONIX) 40 MG tablet Take 1 tablet (40 mg total) by mouth every morning. 90 tablet 1   pravastatin (PRAVACHOL) 80 MG tablet Take 1 tablet (80 mg total) by mouth daily. 90 tablet 3   promethazine-dextromethorphan (PROMETHAZINE-DM) 6.25-15 MG/5ML syrup Take 5 mLs by mouth 4 (four) times daily as needed for cough. 118 mL 0   Tiotropium Bromide-Olodaterol (STIOLTO RESPIMAT) 2.5-2.5 MCG/ACT AERS Inhale 2 puffs into the lungs daily. 4  g 11   valsartan (DIOVAN) 40 MG tablet Take 80 mg by mouth daily.     zolpidem (AMBIEN) 10 MG tablet TAKE 1 TABLET BY MOUTH AT BEDTIME AS NEEDED FOR SLEEP 30 tablet 3   methylPREDNISolone (MEDROL DOSEPAK) 4  MG TBPK tablet Take as package instructions. 1 each 0   torsemide (DEMADEX) 20 MG tablet Take 40 mg by mouth as needed.     Facility-Administered Medications Prior to Visit  Medication Dose Route Frequency Provider Last Rate Last Admin   sodium chloride flush (NS) 0.9 % injection 3 mL  3 mL Intravenous Q12H Jonelle Sidle, MD        No Known Allergies  ROS Review of Systems  Constitutional:  Negative for chills and fever.  HENT:  Negative for congestion, sinus pressure and sinus pain.   Eyes:  Negative for pain and discharge.  Respiratory:  Positive for cough and shortness of breath.   Cardiovascular:  Positive for leg swelling. Negative for chest pain and palpitations.  Gastrointestinal:  Positive for abdominal pain and nausea. Negative for diarrhea and vomiting.  Genitourinary:  Negative for dysuria and hematuria.  Musculoskeletal:  Positive for arthralgias, back pain, gait problem and joint swelling.  Skin:        Bruising over UE and LE  Neurological:  Positive for weakness and headaches.  Psychiatric/Behavioral:  Positive for agitation and sleep disturbance. Negative for behavioral problems. The patient is nervous/anxious.       Objective:    Physical Exam Vitals reviewed.  Constitutional:      General: She is not in acute distress.    Appearance: She is obese. She is not diaphoretic.  HENT:     Head: Normocephalic and atraumatic.     Nose: Congestion present.     Mouth/Throat:     Mouth: Mucous membranes are moist.     Pharynx: No posterior oropharyngeal erythema.  Eyes:     General: No scleral icterus.    Extraocular Movements: Extraocular movements intact.  Cardiovascular:     Rate and Rhythm: Normal rate. Rhythm irregular.     Pulses: Normal pulses.     Heart sounds: Normal heart sounds. No murmur heard. Pulmonary:     Breath sounds: No wheezing.     Comments: Crackles over b/l lower lung fields Abdominal:     Palpations: Abdomen is soft.      Tenderness: There is no abdominal tenderness.  Musculoskeletal:     Cervical back: Normal range of motion and neck supple. No rigidity or tenderness.     Right lower leg: No edema.     Left lower leg: No edema.  Skin:    General: Skin is warm.     Findings: No rash.  Neurological:     General: No focal deficit present.     Mental Status: She is alert and oriented to person, place, and time.     Sensory: No sensory deficit.     Motor: No weakness.     Gait: Gait abnormal (Likely in the setting of baseline hip pain).  Psychiatric:        Mood and Affect: Mood normal.        Behavior: Behavior normal.     BP (!) 121/57 (BP Location: Left Arm, Patient Position: Sitting, Cuff Size: Large)   Pulse 84   Ht 5\' 3"  (1.6 m)   Wt 206 lb 12.8 oz (93.8 kg)   SpO2 91%   BMI 36.63 kg/m  Wt  Readings from Last 3 Encounters:  08/13/22 206 lb 12.8 oz (93.8 kg)  07/17/22 208 lb 6.4 oz (94.5 kg)  07/16/22 210 lb 9.6 oz (95.5 kg)    Lab Results  Component Value Date   TSH 3.450 09/12/2021   Lab Results  Component Value Date   WBC 6.9 09/12/2021   HGB 13.7 09/12/2021   HCT 41.2 09/12/2021   MCV 95 09/12/2021   PLT 269 09/12/2021   Lab Results  Component Value Date   NA 139 09/12/2021   K 5.9 (H) 09/12/2021   CO2 25 09/12/2021   GLUCOSE 105 (H) 09/12/2021   BUN 16 09/12/2021   CREATININE 1.27 (H) 09/12/2021   BILITOT 0.7 09/12/2021   ALKPHOS 114 09/12/2021   AST 30 09/12/2021   ALT 13 09/12/2021   PROT 7.6 09/12/2021   ALBUMIN 4.4 09/12/2021   CALCIUM 10.2 09/12/2021   ANIONGAP 9 03/13/2021   EGFR 44 (L) 09/12/2021   Lab Results  Component Value Date   CHOL 137 09/12/2021   Lab Results  Component Value Date   HDL 46 09/12/2021   Lab Results  Component Value Date   LDLCALC 68 09/12/2021   Lab Results  Component Value Date   TRIG 128 09/12/2021   Lab Results  Component Value Date   CHOLHDL 3.0 09/12/2021   Lab Results  Component Value Date   HGBA1C 6.0  04/13/2022      Assessment & Plan:   Problem List Items Addressed This Visit       Cardiovascular and Mediastinum   Benign essential hypertension - Primary    BP Readings from Last 1 Encounters:  08/13/22 (!) 121/57  Overall very tightly controlled considering her age Amlodipine was discontinued recently Advised to decrease dose of Torsemide to 20 mg QD and Imdur to 30 mg QD Needs to take valsartan only 40 mg daily Counseled for compliance with the medications Advised DASH diet      Relevant Medications   torsemide (DEMADEX) 20 MG tablet   Chronic mesenteric ischemia    On Eliquis currently for A Fib, denies any melena or hematochezia Has had right hemicolectomy      Relevant Medications   torsemide (DEMADEX) 20 MG tablet   Atrial fibrillation    Recently diagnosed On Eliquis now Currently rate controlled, was on metoprolol, held recently due to hypotension Followed by Cardiology      Relevant Medications   torsemide (DEMADEX) 20 MG tablet   HFrEF (heart failure with reduced ejection fraction)    Last echo (02/22) showed LVEF of 45 to 50% She has chronic leg swelling and orthopnea Has bilateral crackles on lower lung fields Advised to take torsemide 20 mg QD X 3 days and then PRN Continue valsartan       Relevant Medications   torsemide (DEMADEX) 20 MG tablet     Respiratory   COPD (chronic obstructive pulmonary disease)    Uncontrolled with Stiolto and PRN Albuterol Her technique of use is questionable, she has been educated how to use her Stiolto, but still compliance is questionable Needs to use maintenance inhalers regularly She would benefit from using nebulizer treatments, started DuoNeb - need to check with DME  Promethazine DM syrup as needed for cough Needs to cut down -> quit smoking        Endocrine   DM2 (diabetes mellitus, type 2)     Lab Results  Component Value Date   HGBA1C 6.0 04/13/2022   Diet controlled Avoid  tighter control in  her case due to risk of hypoglycemia related fall      Relevant Orders   Hemoglobin A1c   Hypothyroidism    Lab Results  Component Value Date   TSH 3.450 09/12/2021  On Levothyroxine 50 mcg recently Check TSH and free T4, adjust dose accordingly      Relevant Orders   TSH + free T4     Genitourinary   Stage 3 chronic kidney disease    Likely due to HTN and DM On Torsemide for HFrEF Avoid nephrotoxic agents including NSAIDs Followed by Dr. Wolfgang Phoenix - last BMP and visit note reviewed      Relevant Medications   torsemide (DEMADEX) 20 MG tablet     Other   HLD (hyperlipidemia)    On pravastatin, needs to be compliant      Relevant Medications   torsemide (DEMADEX) 20 MG tablet   Other Relevant Orders   Lipid Profile   GAD (generalized anxiety disorder)    Takes Xanax, sometimes twice daily - still has anhedonia and spells of anxiety, would avoid increasing dose of Xanax for now as she is also on Ambien On Celexa for MDD and anxiety, increased dose recently Patient also takes Ambien for insomnia. She lives alone and could be a reason for her anxiety as well      Meds ordered this encounter  Medications   torsemide (DEMADEX) 20 MG tablet    Sig: Take 1 tablet (20 mg total) by mouth daily.    Dispense:  30 tablet    Refill:  5    Follow-up: Return in about 3 months (around 11/12/2022) for Annual physical.    Anabel Halon, MD

## 2022-08-17 DIAGNOSIS — I5020 Unspecified systolic (congestive) heart failure: Secondary | ICD-10-CM | POA: Insufficient documentation

## 2022-08-17 NOTE — Assessment & Plan Note (Signed)
Likely due to HTN and DM On Torsemide for HFrEF Avoid nephrotoxic agents including NSAIDs Followed by Dr. Bhutani - last BMP and visit note reviewed 

## 2022-08-17 NOTE — Assessment & Plan Note (Signed)
Uncontrolled with Stiolto and PRN Albuterol Her technique of use is questionable, she has been educated how to use her Stiolto, but still compliance is questionable Needs to use maintenance inhalers regularly She would benefit from using nebulizer treatments, started DuoNeb - need to check with DME  Promethazine DM syrup as needed for cough Needs to cut down -> quit smoking

## 2022-08-17 NOTE — Assessment & Plan Note (Signed)
On Eliquis currently for A Fib, denies any melena or hematochezia Has had right hemicolectomy

## 2022-08-17 NOTE — Assessment & Plan Note (Signed)
Takes Xanax, sometimes twice daily - still has anhedonia and spells of anxiety, would avoid increasing dose of Xanax for now as she is also on Ambien On Celexa for MDD and anxiety, increased dose recently Patient also takes Ambien for insomnia. She lives alone and could be a reason for her anxiety as well

## 2022-08-17 NOTE — Assessment & Plan Note (Signed)
On pravastatin, needs to be compliant

## 2022-08-17 NOTE — Assessment & Plan Note (Signed)
Recently diagnosed On Eliquis now Currently rate controlled, was on metoprolol, held recently due to hypotension Followed by Cardiology 

## 2022-08-17 NOTE — Assessment & Plan Note (Signed)
BP Readings from Last 1 Encounters:  08/13/22 (!) 121/57   Overall very tightly controlled considering her age Amlodipine was discontinued recently Advised to decrease dose of Torsemide to 20 mg QD and Imdur to 30 mg QD Needs to take valsartan only 40 mg daily Counseled for compliance with the medications Advised DASH diet

## 2022-08-17 NOTE — Assessment & Plan Note (Signed)
  Lab Results  Component Value Date   HGBA1C 6.0 04/13/2022    Diet controlled Avoid tighter control in her case due to risk of hypoglycemia related fall 

## 2022-08-17 NOTE — Assessment & Plan Note (Addendum)
Last echo (02/22) showed LVEF of 45 to 50% She has chronic leg swelling and orthopnea Has bilateral crackles on lower lung fields Advised to take torsemide 20 mg QD X 3 days and then PRN Continue valsartan

## 2022-08-17 NOTE — Assessment & Plan Note (Signed)
Lab Results  Component Value Date   TSH 3.450 09/12/2021   On Levothyroxine 50 mcg recently Check TSH and free T4, adjust dose accordingly 

## 2022-08-28 ENCOUNTER — Other Ambulatory Visit: Payer: Self-pay | Admitting: Internal Medicine

## 2022-08-28 DIAGNOSIS — F419 Anxiety disorder, unspecified: Secondary | ICD-10-CM

## 2022-08-30 ENCOUNTER — Other Ambulatory Visit: Payer: Self-pay | Admitting: Cardiology

## 2022-09-03 ENCOUNTER — Other Ambulatory Visit: Payer: Self-pay | Admitting: Internal Medicine

## 2022-09-03 ENCOUNTER — Telehealth: Payer: Self-pay | Admitting: Internal Medicine

## 2022-09-03 DIAGNOSIS — I10 Essential (primary) hypertension: Secondary | ICD-10-CM | POA: Diagnosis not present

## 2022-09-03 DIAGNOSIS — L239 Allergic contact dermatitis, unspecified cause: Secondary | ICD-10-CM

## 2022-09-03 DIAGNOSIS — R809 Proteinuria, unspecified: Secondary | ICD-10-CM | POA: Diagnosis not present

## 2022-09-03 DIAGNOSIS — E1122 Type 2 diabetes mellitus with diabetic chronic kidney disease: Secondary | ICD-10-CM | POA: Diagnosis not present

## 2022-09-03 DIAGNOSIS — N1831 Chronic kidney disease, stage 3a: Secondary | ICD-10-CM | POA: Diagnosis not present

## 2022-09-03 MED ORDER — TRIAMCINOLONE ACETONIDE 0.1 % EX CREA
1.0000 | TOPICAL_CREAM | Freq: Two times a day (BID) | CUTANEOUS | 0 refills | Status: DC
Start: 2022-09-03 — End: 2022-12-10

## 2022-09-03 NOTE — Telephone Encounter (Signed)
Patient granddaughter called patient need refill for her psoriasis does not know the name of medication.   Pharmacy:[facility]

## 2022-09-09 DIAGNOSIS — J449 Chronic obstructive pulmonary disease, unspecified: Secondary | ICD-10-CM | POA: Diagnosis not present

## 2022-09-10 DIAGNOSIS — J449 Chronic obstructive pulmonary disease, unspecified: Secondary | ICD-10-CM | POA: Diagnosis not present

## 2022-09-13 ENCOUNTER — Other Ambulatory Visit: Payer: Self-pay | Admitting: Internal Medicine

## 2022-09-17 ENCOUNTER — Ambulatory Visit: Payer: Medicare Other | Attending: Nurse Practitioner | Admitting: Nurse Practitioner

## 2022-09-17 ENCOUNTER — Encounter: Payer: Self-pay | Admitting: Nurse Practitioner

## 2022-09-17 VITALS — BP 140/70 | HR 58 | Ht 63.0 in | Wt 213.0 lb

## 2022-09-17 DIAGNOSIS — R0609 Other forms of dyspnea: Secondary | ICD-10-CM | POA: Diagnosis not present

## 2022-09-17 DIAGNOSIS — I25119 Atherosclerotic heart disease of native coronary artery with unspecified angina pectoris: Secondary | ICD-10-CM | POA: Diagnosis not present

## 2022-09-17 DIAGNOSIS — I4891 Unspecified atrial fibrillation: Secondary | ICD-10-CM | POA: Diagnosis not present

## 2022-09-17 DIAGNOSIS — I9789 Other postprocedural complications and disorders of the circulatory system, not elsewhere classified: Secondary | ICD-10-CM | POA: Diagnosis not present

## 2022-09-17 DIAGNOSIS — Z720 Tobacco use: Secondary | ICD-10-CM

## 2022-09-17 DIAGNOSIS — I10 Essential (primary) hypertension: Secondary | ICD-10-CM

## 2022-09-17 DIAGNOSIS — I484 Atypical atrial flutter: Secondary | ICD-10-CM | POA: Diagnosis not present

## 2022-09-17 DIAGNOSIS — E785 Hyperlipidemia, unspecified: Secondary | ICD-10-CM

## 2022-09-17 MED ORDER — NITROGLYCERIN 0.4 MG SL SUBL: 0.4000 mg | SUBLINGUAL_TABLET | SUBLINGUAL | 3 refills | Status: DC | PRN

## 2022-09-17 NOTE — Progress Notes (Unsigned)
Office Visit    Patient Name: [name] Date of Encounter: 09/17/2022  PCP:  Anabel Halon, MD   [facility]  Cardiologist:  Nona Dell, MD *** Advanced Practice Provider:  No care team member to display Electrophysiologist:  None  {Press F2 to show EP APP, CHF, sleep or structural heart MD               :[id number]}  { Click here to update then REFRESH NOTE - MD (PCP) or APP (Team Member)  Change PCP Type for MD, Specialty for APP is either Cardiology or Clinical Cardiac Electrophysiology  :[id number]}  Chief Complaint    [name] is a 77 y.o. female with a hx of atypical a flutter, CAD, s/p CABG in 1995, history of past stenting, hypertension, mixed hyperlipidemia, OSA on CPAP, schizophrenia, type 2 diabetes, history of TIA, postop A-fib, and history of mesenteric ischemia, presents today for 74-month follow-up  Past Medical History    Past Medical History:  Diagnosis Date   Acute ischemic colitis (HCC) 09/11/2005   Anxiety    Arthritis    Atherosclerotic vascular disease    Calcified plaque at the origin of the celiac and SMA   CHF (congestive heart failure) (HCC)    Coronary atherosclerosis of native coronary artery    Mulitvessel LVEF 50-50%, DES circ 1/11, occluded SVG to OM and SVG to diagonal , LVEF 60%   COVID-19 virus infection 11/07/2020   Depression    Diverticulosis of colon    Essential hypertension    Hypothyroidism    Mesenteric ischemia (HCC)    Mitral regurgitation    Mild to moderate   Mixed hyperlipidemia    Myocardial infarction (HCC) 1990   Obesity    Orthostatic hypotension    PAD (peripheral artery disease) (HCC)    PVC's (premature ventricular contractions)    Schizophrenia (HCC)    Sleep apnea    CPAP   TIA (transient ischemic attack) 11/20/2017   Tubular adenoma    Type 2 diabetes mellitus (HCC)    Vascular complications of mesenteric artery    Past Surgical History:  Procedure Laterality  Date   BACK SURGERY     Lumbar spine surgery   Carotid endarectomy Bilateral    CARPAL TUNNEL RELEASE     Bilateral   CATARACT EXTRACTION W/PHACO Right 08/24/2013   Procedure: CATARACT EXTRACTION PHACO AND INTRAOCULAR LENS PLACEMENT (IOC);  Surgeon: Gemma Payor, MD;  Location: [facility];  Service: Ophthalmology;  Laterality: Right;  CDE 8.68   COLONOSCOPY  09/14/2005   Dr. Jacqlyn Larsen colitis   COLONOSCOPY WITH ESOPHAGOGASTRODUODENOSCOPY (EGD)  04/14/2012   Dr. Jena Gauss- EGD= gastric erosions of doubtful clinical significance per bx- chronic inactive gastritis, benign small bowel mucosa. TCS=colonic diverticulosis, tubular adenoma   CORONARY ARTERY BYPASS GRAFT     LIMA-LAD; SVG-OM; SVG-DX in 1995 [facility]   NECK SURGERY     Cervical laminectomy   PARTIAL HYSTERECTOMY     RIGHT/LEFT HEART CATH AND CORONARY/GRAFT ANGIOGRAPHY N/A 04/12/2021   Procedure: RIGHT/LEFT HEART CATH AND CORONARY/GRAFT ANGIOGRAPHY;  Surgeon: Iran Ouch, MD;  Location: [facility];  Service: Cardiovascular;  Laterality: N/A;   stents x4      Allergies  No Known Allergies  History of Present Illness    [name] is a 77 y.o. female with a PMH as mentioned above.  Previous cardiovascular history includes CABG in 1995 (LIMA-LAD, SVG-diagonal, SVG-OM, and SVG-PDA).  Underwent  cardiac catheterization in 2011, received drug-eluting stent to circumflex.  Cardiac catheterization in 2022 revealed occluded grafts, patency of LIMA-LAD.  Was noted to have moderate in-stent restenosis in circumflex/OM distribution, was recommended to be medically managed.  Myoview in 2022 revealed normal EF.  Was evaluated at [facility] in February 2024 for near syncope.  She had this experience after taking nitroglycerin.  Due to her mild orthostasis, it was felt that she was somewhat dehydrated and was given IV fluids.  CT of the head was negative for anything acute.  Antihypertensive regimen was decreased.  Last seen by  Dr. Diona Browner on July 16, 2022.  She noted feeling better, her dizziness had improved.  She did note feeling weak with low stamina, this had been a chronic issue.  EKG in office revealed A-fib with nonspecific ST changes.  CHA2DS2-VASc score was found to be 7, discussion of anticoagulation was discussed.  Plavix was stopped and replaced with Eliquis 5 mg twice daily.  Today she presents for 79-month follow-up.  She admits to labile heart rate and blood pressures.  States recent heart rates have been in the 90s when checking her pulse oximeter.  She denies any tachycardia.  SBP averaging around 140s.  Denies any hypotension.  Admits to NYHA class III symptoms.  Seems like her breathing seems to be a little worse.  Does admit to occasional tobacco use.  Previously had quit for around 2 years.  Has quite a bit of anxiety related to her condition.  Does admit to chest fullness/tightness that will cause her to use her nebulizer, this helps, denies any active chest pain. Denies any syncope, presyncope, dizziness, orthopnea, PND, acute bleeding, or claudication.  Weight is up 7 pounds from 1 month ago.  EKGs/Labs/Other Studies Reviewed:   The following studies were reviewed today: ***  EKG:  EKG is *** ordered today.  The ekg ordered today demonstrates ***  Recent Labs: No results found for requested labs within last 365 days.  Recent Lipid Panel    Component Value Date/Time   CHOL 137 09/12/2021 1529   TRIG 128 09/12/2021 1529   HDL 46 09/12/2021 1529   CHOLHDL 3.0 09/12/2021 1529   LDLCALC 68 09/12/2021 1529    Risk Assessment/Calculations:  {Does this patient have ATRIAL FIBRILLATION?:[phone]}  Home Medications   No outpatient medications have been marked as taking for the 09/17/22 encounter (Appointment) with Sharlene Dory, NP.   Current Facility-Administered Medications for the 09/17/22 encounter (Appointment) with Sharlene Dory, NP  Medication   sodium chloride flush (NS) 0.9 %  injection 3 mL     Review of Systems   ***   All other systems reviewed and are otherwise negative except as noted above.  Physical Exam    VS:  There were no vitals taken for this visit. , BMI There is no height or weight on file to calculate BMI.  Wt Readings from Last 3 Encounters:  08/13/22 206 lb 12.8 oz (93.8 kg)  07/17/22 208 lb 6.4 oz (94.5 kg)  07/16/22 210 lb 9.6 oz (95.5 kg)     GEN: Well nourished, well developed, in no acute distress. HEENT: normal. Neck: Supple, no JVD, carotid bruits, or masses. Cardiac: ***RRR, no murmurs, rubs, or gallops. No clubbing, cyanosis, edema.  ***Radials/PT 2+ and equal bilaterally.  Respiratory:  ***Respirations regular and unlabored, clear to auscultation bilaterally. GI: Soft, nontender, nondistended. MS: No deformity or atrophy. Skin: Warm and dry, no rash. Neuro:  Strength and sensation are intact.  Psych: Normal affect.  Assessment & Plan    ***  {Are you ordering a CV Procedure (e.g. stress test, cath, DCCV, TEE, etc)?   Press F2        :[id number]}      Disposition: Follow up {follow up:15908} with Nona Dell, MD or APP.  Signed, Sharlene Dory, NP 09/17/2022, 12:39 PM [facility]

## 2022-09-17 NOTE — Patient Instructions (Signed)
Medication Instructions:  Nitroglycerin refilled today  Continue all other medications.     Labwork: none  Testing/Procedures: Your physician has requested that you have an echocardiogram. Echocardiography is a painless test that uses sound waves to create images of your heart. It provides your doctor with information about the size and shape of your heart and how well your heart's chambers and valves are working. This procedure takes approximately one hour. There are no restrictions for this procedure. Please do NOT wear cologne, perfume, aftershave, or lotions (deodorant is allowed). Please arrive 15 minutes prior to your appointment time. Office will contact with results via phone, letter or mychart.     Follow-Up: 2-3 months    Any Other Special Instructions Will Be Listed Below (If Applicable).   If you need a refill on your cardiac medications before your next appointment, please call your pharmacy.  

## 2022-10-08 ENCOUNTER — Other Ambulatory Visit: Payer: Self-pay | Admitting: Internal Medicine

## 2022-10-08 DIAGNOSIS — G4700 Insomnia, unspecified: Secondary | ICD-10-CM

## 2022-10-10 DIAGNOSIS — J449 Chronic obstructive pulmonary disease, unspecified: Secondary | ICD-10-CM | POA: Diagnosis not present

## 2022-11-02 ENCOUNTER — Ambulatory Visit (HOSPITAL_COMMUNITY)
Admission: RE | Admit: 2022-11-02 | Discharge: 2022-11-02 | Disposition: A | Discharge status: Home / Self Care | Payer: Medicare Other | Source: Ambulatory Visit | Attending: Nurse Practitioner | Admitting: Nurse Practitioner

## 2022-11-02 DIAGNOSIS — R0609 Other forms of dyspnea: Secondary | ICD-10-CM | POA: Diagnosis not present

## 2022-11-02 LAB — ECHOCARDIOGRAM COMPLETE
Area-P 1/2: 4.4 cm2
Calc EF: 41.4 %
MV M vel: 4.85 m/s
MV Peak grad: 94.1 mmHg
Radius: 0.45 cm
S' Lateral: 3.5 cm
Single Plane A2C EF: 37.5 %
Single Plane A4C EF: 45.3 %

## 2022-11-02 NOTE — Progress Notes (Signed)
  Echocardiogram 2D Echocardiogram has been performed.  [name] 11/02/2022, 11:13 AM

## 2022-11-07 NOTE — Progress Notes (Deleted)
Name: [name] DOB: 07-14-1945 MRN: [id number]  History of Present Illness: [name] is a 77 y.o. female who presents today as a new patient at [facility] . All available relevant medical records have been reviewed.  > GU / GYN History:  - Previously saw Dr. Ronne Binning in 2019 for urinary urgency and weak urinary stream.  - Surgical history includes "bladder tack".  She reports chief complaint of ***insensate urinary incontinence.   She {Actions; denies-reports:120008} urge incontinence. She {Actions; denies-reports:120008} stress incontinence with ***cough/***laugh/***sneeze/***heavy lifting /***exercise. She reports the ***SUI / ***UUI is predominant.  She leaks *** times per ***. Wears *** ***pads/ ***diapers per day. She reports urinary incontinence {ACTION; IS/IS WUJ:[id number]} significantly bothersome.   She {Actions; denies-reports:120008} increased urinary urgency, frequency, dysuria, gross hematuria, straining to void, or sensations of incomplete emptying. She reports *** urinary stream.   Fall Screening: Do you usually have a device to assist in your mobility? {yes/no:20286} ***cane / ***walker / ***wheelchair  Medications: Current Outpatient Medications  Medication Sig Dispense Refill   albuterol (VENTOLIN HFA) 108 (90 Base) MCG/ACT inhaler Inhale 1 puff into the lungs every 6 (six) hours as needed for wheezing or shortness of breath.     ALPRAZolam (XANAX) 0.5 MG tablet TAKE 1 TABLET BY MOUTH TWICE DAILY AS NEEDED FOR ANXIETY 60 tablet 2   amLODipine (NORVASC) 10 MG tablet Take 10 mg by mouth daily.     apixaban (ELIQUIS) 5 MG TABS tablet Take 1 tablet (5 mg total) by mouth 2 (two) times daily. 60 tablet 6   aspirin EC 81 MG tablet Take 81 mg by mouth at bedtime.     Biotin 29562 MCG TABS Take 1 tablet by mouth daily.     blood glucose meter kit and supplies Dispense based on patient and insurance preference. Use up to four times daily as  directed. (FOR ICD-10 E10.9, E11.9). 1 each 0   calcitRIOL (ROCALTROL) 0.25 MCG capsule Take 1 capsule by mouth 2 (two) times a week.     Cholecalciferol (VITAMIN D3) 50 MCG (2000 UT) TABS Take 1 tablet by mouth daily.     citalopram (CELEXA) 20 MG tablet TAKE ONE (1) TABLET BY MOUTH EVERY DAY 90 tablet 1   Collagen-Vitamin C (COLLAGEN PLUS VITAMIN C PO) Take 1 tablet by mouth daily.     dapagliflozin propanediol (FARXIGA) 5 MG TABS tablet Take 5 mg by mouth daily.     diphenoxylate-atropine (LOMOTIL) 2.5-0.025 MG tablet Take 1 tablet by mouth 2 (two) times daily as needed for diarrhea or loose stools.     famotidine (PEPCID) 40 MG tablet Take 1 tablet (40 mg total) by mouth daily as needed for heartburn or indigestion. 30 tablet 5   fluticasone (FLOVENT HFA) 110 MCG/ACT inhaler Inhale 2 puffs into the lungs 2 (two) times daily.     ipratropium (ATROVENT) 0.03 % nasal spray Place 2 sprays into both nostrils every 12 (twelve) hours. 30 mL 12   ipratropium-albuterol (DUONEB) 0.5-2.5 (3) MG/3ML SOLN Take 3 mLs by nebulization 4 times daily and as needed for shortness of breath or wheezing. 450 mL 11   isosorbide mononitrate (IMDUR) 60 MG 24 hr tablet TAKE ONE (1) TABLET EACH DAY 90 tablet 1   levothyroxine (SYNTHROID) 50 MCG tablet Take 1 tablet (50 mcg total) by mouth daily before breakfast. 30 tablet 11   Melatonin 10 MG TABS Take 10 mg by mouth at bedtime as needed (sleep).     metoprolol tartrate (  LOPRESSOR) 50 MG tablet TAKE ONE (1) TABLET BY MOUTH TWO (2) TIMES DAILY 180 tablet 0   nitroGLYCERIN (NITROSTAT) 0.4 MG SL tablet Place 1 tablet (0.4 mg total) under the tongue every 5 (five) minutes x 3 doses as needed for chest pain. 25 tablet 3   ondansetron (ZOFRAN ODT) 4 MG disintegrating tablet Take 1 tablet (4 mg total) by mouth every 8 (eight) hours as needed for nausea or vomiting. 20 tablet 0   OneTouch Delica Lancets 33G MISC 4 TIMES DAILY 100 each 5   ONETOUCH VERIO test strip 4 TIMES DAILY  100 each 5   pantoprazole (PROTONIX) 40 MG tablet TAKE ONE TABLET BY MOUTH EVERY MORNING 90 tablet 1   pravastatin (PRAVACHOL) 80 MG tablet Take 1 tablet (80 mg total) by mouth daily. 90 tablet 3   promethazine-dextromethorphan (PROMETHAZINE-DM) 6.25-15 MG/5ML syrup Take 5 mLs by mouth 4 (four) times daily as needed for cough. 118 mL 0   Tiotropium Bromide-Olodaterol (STIOLTO RESPIMAT) 2.5-2.5 MCG/ACT AERS Inhale 2 puffs into the lungs daily. 4 g 11   torsemide (DEMADEX) 20 MG tablet Take 1 tablet (20 mg total) by mouth daily. 30 tablet 5   triamcinolone cream (KENALOG) 0.1 % Apply 1 Application topically 2 (two) times daily. 30 g 0   valsartan (DIOVAN) 40 MG tablet Take 80 mg by mouth daily.     zolpidem (AMBIEN) 10 MG tablet TAKE 1 TABLET BY MOUTH AT BEDTIME AS NEEDED FOR SLEEP 30 tablet 2   Current Facility-Administered Medications  Medication Dose Route Frequency Provider Last Rate Last Admin   sodium chloride flush (NS) 0.9 % injection 3 mL  3 mL Intravenous Q12H Jonelle Sidle, MD        Allergies: No Known Allergies  Past Medical History:  Diagnosis Date   Acute ischemic colitis (HCC) 09/11/2005   Anxiety    Arthritis    Atherosclerotic vascular disease    Calcified plaque at the origin of the celiac and SMA   CHF (congestive heart failure) (HCC)    Coronary atherosclerosis of native coronary artery    Mulitvessel LVEF 50-50%, DES circ 1/11, occluded SVG to OM and SVG to diagonal , LVEF 60%   COVID-19 virus infection 11/07/2020   Depression    Diverticulosis of colon    Essential hypertension    Hypothyroidism    Mesenteric ischemia (HCC)    Mitral regurgitation    Mild to moderate   Mixed hyperlipidemia    Myocardial infarction (HCC) 1990   Obesity    Orthostatic hypotension    PAD (peripheral artery disease) (HCC)    PVC's (premature ventricular contractions)    Schizophrenia (HCC)    Sleep apnea    CPAP   TIA (transient ischemic attack) 11/20/2017   Tubular  adenoma    Type 2 diabetes mellitus (HCC)    Vascular complications of mesenteric artery    Past Surgical History:  Procedure Laterality Date   BACK SURGERY     Lumbar spine surgery   Carotid endarectomy Bilateral    CARPAL TUNNEL RELEASE     Bilateral   CATARACT EXTRACTION W/PHACO Right 08/24/2013   Procedure: CATARACT EXTRACTION PHACO AND INTRAOCULAR LENS PLACEMENT (IOC);  Surgeon: Gemma Payor, MD;  Location: [facility];  Service: Ophthalmology;  Laterality: Right;  CDE 8.68   COLONOSCOPY  09/14/2005   Dr. Jacqlyn Larsen colitis   COLONOSCOPY WITH ESOPHAGOGASTRODUODENOSCOPY (EGD)  04/14/2012   Dr. Jena Gauss- EGD= gastric erosions of doubtful clinical significance per bx- chronic  inactive gastritis, benign small bowel mucosa. TCS=colonic diverticulosis, tubular adenoma   CORONARY ARTERY BYPASS GRAFT     LIMA-LAD; SVG-OM; SVG-DX in 1995 [facility]   NECK SURGERY     Cervical laminectomy   PARTIAL HYSTERECTOMY     RIGHT/LEFT HEART CATH AND CORONARY/GRAFT ANGIOGRAPHY N/A 04/12/2021   Procedure: RIGHT/LEFT HEART CATH AND CORONARY/GRAFT ANGIOGRAPHY;  Surgeon: Iran Ouch, MD;  Location: [facility];  Service: Cardiovascular;  Laterality: N/A;   stents x4     Family History  Problem Relation Age of Onset   Alcohol abuse Mother    Alcohol abuse Father    Coronary artery disease Other    Hypertension Other    Crohn's disease Sister 38   Stroke Daughter    Social History   Socioeconomic History   Marital status: Widowed    Spouse name: Not on file   Number of children: 4   Years of education: Not on file   Highest education level: Not on file  Occupational History   Occupation: DISABLED    Employer: UNEMPLOYED  Tobacco Use   Smoking status: Light Smoker    Packs/day: 0.50    Years: 50.00    Additional pack years: 0.00    Total pack years: 25.00    Types: Cigarettes    Start date: 12/30/2018    Passive exposure: Never   Smokeless tobacco: Never  Vaping Use   Vaping Use:  Never used  Substance and Sexual Activity   Alcohol use: Yes    Alcohol/week: 0.0 standard drinks of alcohol    Comment: occasionally    Drug use: No   Sexual activity: Not on file  Other Topics Concern   Not on file  Social History Narrative   LIves alone   Social Determinants of Health   Financial Resource Strain: Low Risk  (02/27/2021)   Overall Financial Resource Strain (CARDIA)    Difficulty of Paying Living Expenses: Not hard at all  Food Insecurity: No Food Insecurity (02/27/2021)   Hunger Vital Sign    Worried About Running Out of Food in the Last Year: Never true    Ran Out of Food in the Last Year: Never true  Transportation Needs: No Transportation Needs (02/27/2021)   PRAPARE - Administrator, Civil Service (Medical): No    Lack of Transportation (Non-Medical): No  Physical Activity: Inactive (02/27/2021)   Exercise Vital Sign    Days of Exercise per Week: 0 days    Minutes of Exercise per Session: 0 min  Stress: No Stress Concern Present (02/27/2021)   [employer] of Occupational Health - Occupational Stress Questionnaire    Feeling of Stress : Only a little  Social Connections: Socially Isolated (02/27/2021)   Social Connection and Isolation Panel [NHANES]    Frequency of Communication with Friends and Family: More than three times a week    Frequency of Social Gatherings with Friends and Family: Never    Attends Religious Services: Never    Database administrator or Organizations: No    Attends Banker Meetings: Never    Marital Status: Widowed  Intimate Partner Violence: Not At Risk (02/27/2021)   Humiliation, Afraid, Rape, and Kick questionnaire    Fear of Current or Ex-Partner: No    Emotionally Abused: No    Physically Abused: No    Sexually Abused: No    SUBJECTIVE  Review of Systems Constitutional: Patient ***denies any unintentional weight loss or change in strength  lntegumentary: Patient ***denies any rashes or  pruritus Eyes: Patient denies ***dry eyes ENT: Patient ***denies dry mouth Cardiovascular: Patient ***denies chest pain or syncope Respiratory: Patient ***denies shortness of breath Gastrointestinal: Patient ***denies nausea, vomiting, constipation, or diarrhea Musculoskeletal: Patient ***denies muscle cramps or weakness Neurologic: Patient ***denies convulsions or seizures Psychiatric: Patient ***denies memory problems Allergic/Immunologic: Patient ***denies recent allergic reaction(s) Hematologic/Lymphatic: Patient denies bleeding tendencies Endocrine: Patient ***denies heat/cold intolerance  GU: As per HPI.  OBJECTIVE There were no vitals filed for this visit. There is no height or weight on file to calculate BMI.  Physical Examination  Constitutional: ***No obvious distress; patient is ***non-toxic appearing  Cardiovascular: ***No visible lower extremity edema.  Respiratory: The patient does ***not have audible wheezing/stridor; respirations do ***not appear labored  Gastrointestinal: Abdomen ***non-distended Musculoskeletal: ***Normal ROM of UEs  Skin: ***No obvious rashes/open sores  Neurologic: CN 2-12 grossly ***intact Psychiatric: Answered questions ***appropriately with ***normal affect  Hematologic/Lymphatic/Immunologic: ***No obvious bruises or sites of spontaneous bleeding  UA: {Desc; negative/positive:13464} for *** WBC/hpf, *** RBC/hpf, bacteria (***) *** nitrites, *** leukocytes, *** blood PVR: *** ml  ASSESSMENT No diagnosis found. ***  Will plan for follow up in *** months or sooner if needed. Pt verbalized understanding and agreement. All questions were answered.  PLAN Advised the following: *** ***No follow-ups on file.  No orders of the defined types were placed in this encounter.   It has been explained that the patient is to follow regularly with their PCP in addition to all other providers involved in their care and to follow instructions  provided by these respective offices. Patient advised to contact urology clinic if any urologic-pertaining questions, concerns, new symptoms or problems arise in the interim period.  There are no Patient Instructions on file for this visit.  Electronically signed by:  Donnita Falls, MSN, FNP-C, CUNP 11/07/2022 9:06 AM

## 2022-11-09 ENCOUNTER — Ambulatory Visit: Payer: Medicare Other | Admitting: Urology

## 2022-11-09 DIAGNOSIS — N3942 Incontinence without sensory awareness: Secondary | ICD-10-CM

## 2022-11-09 DIAGNOSIS — J449 Chronic obstructive pulmonary disease, unspecified: Secondary | ICD-10-CM | POA: Diagnosis not present

## 2022-11-11 NOTE — Progress Notes (Signed)
Name: [name] DOB: 18-May-1945 MRN: [id number]   History of Present Illness: Ms. [name] is a 77 y.o. female who presents today as a new patient at [facility]. All available relevant medical records have been reviewed. She is accompanied by her granddaughter [name]. > GU / GYN History:  - Previously saw Dr. Ronne Binning in 2019 for urinary urgency and weak urinary stream.  - Surgical history includes "bladder tack" for repair of pelvic organ prolapse many years ago.  - She is followed by nephrology (Dr. Wolfgang Phoenix).  She reports constant urinary urgency and accompanying bladder pain with that urge sensation. She reports that her bladder pain improves just after voiding and is worse the more full her bladder is. She states this all started 3-4 years ago. She voids 2-3 times per day and states that when she goes not much urine comes out. Reports sensations of incomplete emptying. Denies dysuria, gross hematuria, incontinence, or straining to void.  Denies any recent or recurrent UTIs.  Reports constipation.   She denies history of kidney stones. CT abdomen/pelvis on 03/13/2021 showed no GU stones, masses, or hydronephrosis.   Fall Screening: Do you usually have a device to assist in your mobility? No   Medications: Current Outpatient Medications  Medication Sig Dispense Refill   albuterol (VENTOLIN HFA) 108 (90 Base) MCG/ACT inhaler Inhale 1 puff into the lungs every 6 (six) hours as needed for wheezing or shortness of breath.     ALPRAZolam (XANAX) 0.5 MG tablet TAKE 1 TABLET BY MOUTH TWICE DAILY AS NEEDED FOR ANXIETY 60 tablet 2   amLODipine (NORVASC) 10 MG tablet Take 10 mg by mouth daily.     apixaban (ELIQUIS) 5 MG TABS tablet Take 1 tablet (5 mg total) by mouth 2 (two) times daily. 60 tablet 6   aspirin EC 81 MG tablet Take 81 mg by mouth at bedtime.     Biotin 81191 MCG TABS Take 1 tablet by mouth daily.     blood glucose meter kit and supplies Dispense  based on patient and insurance preference. Use up to four times daily as directed. (FOR ICD-10 E10.9, E11.9). 1 each 0   calcitRIOL (ROCALTROL) 0.25 MCG capsule Take 1 capsule by mouth 2 (two) times a week.     Cholecalciferol (VITAMIN D3) 50 MCG (2000 UT) TABS Take 1 tablet by mouth daily.     citalopram (CELEXA) 20 MG tablet TAKE ONE (1) TABLET BY MOUTH EVERY DAY 90 tablet 1   Collagen-Vitamin C (COLLAGEN PLUS VITAMIN C PO) Take 1 tablet by mouth daily.     dapagliflozin propanediol (FARXIGA) 5 MG TABS tablet Take 5 mg by mouth daily.     diphenoxylate-atropine (LOMOTIL) 2.5-0.025 MG tablet Take 1 tablet by mouth 2 (two) times daily as needed for diarrhea or loose stools.     famotidine (PEPCID) 40 MG tablet Take 1 tablet (40 mg total) by mouth daily as needed for heartburn or indigestion. 30 tablet 5   fluticasone (FLOVENT HFA) 110 MCG/ACT inhaler Inhale 2 puffs into the lungs 2 (two) times daily.     ipratropium (ATROVENT) 0.03 % nasal spray Place 2 sprays into both nostrils every 12 (twelve) hours. 30 mL 12   ipratropium-albuterol (DUONEB) 0.5-2.5 (3) MG/3ML SOLN Take 3 mLs by nebulization 4 times daily and as needed for shortness of breath or wheezing. 450 mL 11   isosorbide mononitrate (IMDUR) 60 MG 24 hr tablet TAKE ONE (1) TABLET EACH DAY 90 tablet 1  levothyroxine (SYNTHROID) 50 MCG tablet Take 1 tablet (50 mcg total) by mouth daily before breakfast. 30 tablet 11   Melatonin 10 MG TABS Take 10 mg by mouth at bedtime as needed (sleep).     metoprolol tartrate (LOPRESSOR) 50 MG tablet TAKE ONE (1) TABLET BY MOUTH TWO (2) TIMES DAILY 180 tablet 0   nitroGLYCERIN (NITROSTAT) 0.4 MG SL tablet Place 1 tablet (0.4 mg total) under the tongue every 5 (five) minutes x 3 doses as needed for chest pain. 25 tablet 3   ondansetron (ZOFRAN ODT) 4 MG disintegrating tablet Take 1 tablet (4 mg total) by mouth every 8 (eight) hours as needed for nausea or vomiting. 20 tablet 0   OneTouch Delica Lancets 33G  MISC 4 TIMES DAILY 100 each 5   ONETOUCH VERIO test strip 4 TIMES DAILY 100 each 5   pantoprazole (PROTONIX) 40 MG tablet TAKE ONE TABLET BY MOUTH EVERY MORNING 90 tablet 1   pravastatin (PRAVACHOL) 80 MG tablet Take 1 tablet (80 mg total) by mouth daily. 90 tablet 3   promethazine-dextromethorphan (PROMETHAZINE-DM) 6.25-15 MG/5ML syrup Take 5 mLs by mouth 4 (four) times daily as needed for cough. 118 mL 0   Tiotropium Bromide-Olodaterol (STIOLTO RESPIMAT) 2.5-2.5 MCG/ACT AERS Inhale 2 puffs into the lungs daily. 4 g 11   torsemide (DEMADEX) 20 MG tablet Take 1 tablet (20 mg total) by mouth daily. 30 tablet 5   triamcinolone cream (KENALOG) 0.1 % Apply 1 Application topically 2 (two) times daily. 30 g 0   valsartan (DIOVAN) 40 MG tablet Take 80 mg by mouth daily.     zolpidem (AMBIEN) 10 MG tablet TAKE 1 TABLET BY MOUTH AT BEDTIME AS NEEDED FOR SLEEP 30 tablet 2   Current Facility-Administered Medications  Medication Dose Route Frequency Provider Last Rate Last Admin   sodium chloride flush (NS) 0.9 % injection 3 mL  3 mL Intravenous Q12H Jonelle Sidle, MD        Allergies: No Known Allergies  Past Medical History:  Diagnosis Date   Acute ischemic colitis (HCC) 09/11/2005   Anxiety    Arthritis    Atherosclerotic vascular disease    Calcified plaque at the origin of the celiac and SMA   CHF (congestive heart failure) (HCC)    Coronary atherosclerosis of native coronary artery    Mulitvessel LVEF 50-50%, DES circ 1/11, occluded SVG to OM and SVG to diagonal , LVEF 60%   COVID-19 virus infection 11/07/2020   Depression    Diverticulosis of colon    Essential hypertension    Hypothyroidism    Mesenteric ischemia (HCC)    Mitral regurgitation    Mild to moderate   Mixed hyperlipidemia    Myocardial infarction (HCC) 1990   Obesity    Orthostatic hypotension    PAD (peripheral artery disease) (HCC)    PVC's (premature ventricular contractions)    Schizophrenia (HCC)     Sleep apnea    CPAP   TIA (transient ischemic attack) 11/20/2017   Tubular adenoma    Type 2 diabetes mellitus (HCC)    Vascular complications of mesenteric artery    Past Surgical History:  Procedure Laterality Date   BACK SURGERY     Lumbar spine surgery   Carotid endarectomy Bilateral    CARPAL TUNNEL RELEASE     Bilateral   CATARACT EXTRACTION W/PHACO Right 08/24/2013   Procedure: CATARACT EXTRACTION PHACO AND INTRAOCULAR LENS PLACEMENT (IOC);  Surgeon: Gemma Payor, MD;  Location: [facility];  Service: Ophthalmology;  Laterality: Right;  CDE 8.68   COLONOSCOPY  09/14/2005   Dr. Jacqlyn Larsen colitis   COLONOSCOPY WITH ESOPHAGOGASTRODUODENOSCOPY (EGD)  04/14/2012   Dr. Jena Gauss- EGD= gastric erosions of doubtful clinical significance per bx- chronic inactive gastritis, benign small bowel mucosa. TCS=colonic diverticulosis, tubular adenoma   CORONARY ARTERY BYPASS GRAFT     LIMA-LAD; SVG-OM; SVG-DX in 1995 [facility]   NECK SURGERY     Cervical laminectomy   PARTIAL HYSTERECTOMY     RIGHT/LEFT HEART CATH AND CORONARY/GRAFT ANGIOGRAPHY N/A 04/12/2021   Procedure: RIGHT/LEFT HEART CATH AND CORONARY/GRAFT ANGIOGRAPHY;  Surgeon: Iran Ouch, MD;  Location: [facility];  Service: Cardiovascular;  Laterality: N/A;   stents x4     Family History  Problem Relation Age of Onset   Alcohol abuse Mother    Alcohol abuse Father    Coronary artery disease Other    Hypertension Other    Crohn's disease Sister 82   Stroke Daughter    Social History   Socioeconomic History   Marital status: Widowed    Spouse name: Not on file   Number of children: 4   Years of education: Not on file   Highest education level: Not on file  Occupational History   Occupation: DISABLED    Employer: UNEMPLOYED  Tobacco Use   Smoking status: Every Day    Current packs/day: 0.50    Average packs/day: 0.5 packs/day for 50.0 years (25.0 ttl pk-yrs)    Types: Cigarettes    Start date: 12/30/2018     Passive exposure: Never   Smokeless tobacco: Never  Vaping Use   Vaping status: Never Used  Substance and Sexual Activity   Alcohol use: Yes    Alcohol/week: 0.0 standard drinks of alcohol    Comment: occasionally    Drug use: No   Sexual activity: Not Currently  Other Topics Concern   Not on file  Social History Narrative   LIves alone   Social Determinants of Health   Financial Resource Strain: Low Risk  (06/05/2022)   Received from [facility], [facility]   Overall Financial Resource Strain (CARDIA)    Difficulty of Paying Living Expenses: Not hard at all  Food Insecurity: No Food Insecurity (06/05/2022)   Received from [facility], [facility]   Hunger Vital Sign    Worried About Running Out of Food in the Last Year: Never true    Ran Out of Food in the Last Year: Never true  Transportation Needs: No Transportation Needs (06/05/2022)   Received from [facility], [facility]   [facility] - Transportation    Lack of Transportation (Medical): No    Lack of Transportation (Non-Medical): No  Physical Activity: Inactive (02/27/2021)   Exercise Vital Sign    Days of Exercise per Week: 0 days    Minutes of Exercise per Session: 0 min  Stress: No Stress Concern Present (02/27/2021)   [employer] of Occupational Health - Occupational Stress Questionnaire    Feeling of Stress : Only a little  Social Connections: Socially Isolated (02/27/2021)   Social Connection and Isolation Panel [NHANES]    Frequency of Communication with Friends and Family: More than three times a week    Frequency of Social Gatherings with Friends and Family: Never    Attends Religious Services: Never    Database administrator or Organizations: No    Attends Banker Meetings: Never    Marital  Status: Widowed  Intimate Partner Violence: Not At Risk (02/27/2021)   Humiliation, Afraid, Rape, and Kick questionnaire    Fear of Current or Ex-Partner: No    Emotionally  Abused: No    Physically Abused: No    Sexually Abused: No    SUBJECTIVE  Review of Systems Constitutional: Patient denies any unintentional weight loss or change in strength lntegumentary: Patient denies any rashes or pruritus Cardiovascular: Patient denies chest pain or syncope Respiratory: Patient denies shortness of breath Gastrointestinal: Patient denies nausea, vomiting, or diarrhea. Reports constipation. Musculoskeletal: Patient denies muscle cramps or weakness Neurologic: Patient denies convulsions or seizures Psychiatric: Patient denies memory problems Allergic/Immunologic: Patient denies recent allergic reaction(s) Hematologic/Lymphatic: Patient denies bleeding tendencies Endocrine: Patient denies heat/cold intolerance  GU: As per HPI.  OBJECTIVE Vitals:   11/15/22 1107  BP: 101/67  Pulse: 70  Temp: 98.4 F (36.9 C)   There is no height or weight on file to calculate BMI.  Physical Examination  Constitutional: No obvious distress; patient is non-toxic appearing  Cardiovascular: No visible lower extremity edema.  Respiratory: The patient does not have audible wheezing/stridor; respirations do not appear labored  Gastrointestinal: Abdomen non-distended Musculoskeletal: Normal ROM of UEs  Skin: No obvious rashes/open sores  Neurologic: CN 2-12 grossly intact Psychiatric: Answered questions appropriately with normal affect  Hematologic/Lymphatic/Immunologic: No obvious bruises or sites of spontaneous bleeding  UA: negative PVR: 9 ml  ASSESSMENT Urinary urgency - Plan: Urinalysis, Routine w reflex microscopic, BLADDER SCAN AMB NON-IMAGING  Feeling of incomplete bladder emptying  Bladder pain  No acute findings today. Possible etiologies / contributing factors for her symptoms including but not limited to: OAB, UTI, interstitial cystitis, malignancy, high-tone pelvic floor dysfunction, constipation. Low suspicion for stone per history / prior imaging. Advised  constipation management with laxatives / stool softeners PRN and possible follow up with her GI provider. Discussed avoidance of bladder irritants; she was given a copy of IC diet for guidance. Will check MDX urine culture and advised further evaluation with cystoscopy. Pt verbalized understanding and agreement. All questions were answered.  PLAN Advised the following: MDX urine culture. Constipation management. Avoid bladder irritants. Return for 1st available cystoscopy with Dr. Ronne Binning.  Orders Placed This Encounter  Procedures   Urinalysis, Routine w reflex microscopic   BLADDER SCAN AMB NON-IMAGING    It has been explained that the patient is to follow regularly with their PCP in addition to all other providers involved in their care and to follow instructions provided by these respective offices. Patient advised to contact urology clinic if any urologic-pertaining questions, concerns, new symptoms or problems arise in the interim period.  Patient Instructions  INTERSTITIAL CYSTITIS DIET  Many people with Interstitial Cystitis find that simple changes in their diet can help to control IC symptoms and avoid IC flare-ups. Typically, avoiding foods high in acid and potassium, as well as beverages containing caffeine and alcohol, is a good idea. This helpful guide can help you make "IC-Smart" meal choices. Keep it handy for easy reference when dining out or when preparing meals at home.  FRUITS: Allowable - Blueberries, Melons (except cantaloupe), Minute Maid Acid-Reduced Orange Juice (this can be diluted using water or Prelief* to prevent a flare-up) and Pears.  Avoidable -All other Fruits and Juices.  VEGETABLES:  Allowable - Homegrown Tomatoes, Acorn, Alfalfa, Beet, Broccoli, Bok Choy, [address], [place], [name], [place], [place], [place], [place], [place], [place], Mustard [place] and [employer].  Avoidable - Store-bought Tomatoes, Onions, Tofu, Soybeans, Lima beans, Connye Burkitt  Beans,  Asparagus, Beet Greens, [place], [place], [place], [place], [place], [place], Mushrooms, [place], [place], [place], [place], [employer] and Rhubarb.  MEATS/FISH: Allowable - Beef, Chicken, [place], [place], [address], [address], [place], [place], [place], [place], [place], [place], [place], [place], [place], [place] and Shrimp.  Avoidable - Chicken Liver, Corned Beef, Pickled Herring and Fresh Herring, Aged, [address], Cured, Processed or Smoked Meats/Fish, [place], [place], [address], [place], [place], [place], [place], [place], [place], [place] and meats that contain nitrates or nitrites.  CARBS/GRAINS:  Allowable - Pasta, Rice, Potatoes, White Bread, Bread made of Rice, Millet, Quinoa, Buckwheat, [place], Refined, Cooked or Ready to Eat Cereal.  Avoidable - Rye Bread, Sourdough Breads and Fresh Baked Yeast Products  FATS:    DAIRY: Allowable - Butter, Margarine, Vegetable Oils, Almonds, Cashews and Pine Nuts. Avoidable - Any other Nuts, Mayonnaise, Salad Dressing. Allowable - Milk, American Cheese, [address] Cream Cheese, String Cheese, [place], Ricotta Cheese, Frozen Yogurt and White Chocolate.   Avoidable - Yogurt, Goat's Milk, Chocolate Milk, Sour Cream, Aged Cheeses, Chocolate,  [place], Camembert, Cheddar, [address], [place], [place], [place], [place], [place], [place], [place], [place] and [place].  SOUPS: Allowable - Soups made with allowed Vegetables, Cream of Broccoli, Water Cress Soup, Cream of Cauliflower Soup.  Avoidable -Any packaged Soups.   SEASONINGS: Allowable - Garlic and other seasonings not listed below.  Avoid - Miso, Soy Sauce, Vinegar and any Spicy Foods (especially [place], [place], [place] and [place] foods)  BEVERAGES:  Allowable - Bottled or Spring Water, Decaffeinated, Acid-free or Low Acid Coffee or Tea, Alfalfa Leaf Tea, Sun Tea, Kava Instant Coffee, Peppermint Tea, Golden Seal Tea, Flat Soda and Low Acid Wines.  Avoid -Alcoholic Beverages (except Low Acid  Wines), Carbonated Drinks such as Soda, Coffee and Tea (except what is listed above), Fruit Juices and Chamomile Tea.  PRESERVATIVE: Avoid - Benzol Alcohol, Citric Acid, Monosodium Glutamate (MSG), Aspartame, Saccharin and foods containing Artificial Ingredients/Colors.   Electronically signed by:  Donnita Falls, MSN, FNP-C, CUNP 11/15/2022 12:21 PM

## 2022-11-14 ENCOUNTER — Encounter: Payer: Self-pay | Admitting: Primary Care

## 2022-11-14 ENCOUNTER — Ambulatory Visit: Payer: Medicare Other | Admitting: Primary Care

## 2022-11-14 VITALS — BP 112/68 | HR 61 | Ht 63.0 in | Wt 210.0 lb

## 2022-11-14 DIAGNOSIS — R0683 Snoring: Secondary | ICD-10-CM

## 2022-11-14 DIAGNOSIS — G4733 Obstructive sleep apnea (adult) (pediatric): Secondary | ICD-10-CM | POA: Diagnosis not present

## 2022-11-14 DIAGNOSIS — Z8669 Personal history of other diseases of the nervous system and sense organs: Secondary | ICD-10-CM

## 2022-11-14 NOTE — Assessment & Plan Note (Signed)
-   Hx sleep apnea, previously on CPAP. Having symptoms loud snoring, waking up gasping for air and daytime sleepiness. Epworth 10. Patient needs polysomnography, patient prefers sleep study be done at home due to anxiety and insomnia. She sleeps in a recliner and would have difficulty sleeping flat. We will check HST. Reviewed risks of untreated sleep apnea including cardiac arrhythmias, pulmonary hypertension, diabetes and stroke.  We also discussed treatment options including weight loss, oral appliance, CPAP therapy or referral to ENT for possible surgical options.  Patient is open to resuming CPAP if needed. Advised patient elevate HOB 30 degrees. Work on weight loss. FU up 1-2 weeks after sleep study to review results.

## 2022-11-14 NOTE — Progress Notes (Signed)
Reviewed and agree with assessment/plan.   Coralyn Helling, MD [facility] Pulmonary/Critical Care 11/14/2022, 1:37 PM Pager:  [phone]

## 2022-11-14 NOTE — Patient Instructions (Signed)

## 2022-11-14 NOTE — Assessment & Plan Note (Addendum)
-   Patient has mild obstructive lung disease with positive BD response. No acute respiratory complaints. She is a current smoker. Not on oxygen. Maintained on Stiolto and Flovebnt, managed by PCP.

## 2022-11-14 NOTE — Progress Notes (Signed)
@Patient  ID: [name], female    DOB: 10/11/1945, 77 y.o.   MRN: [id number]  Chief Complaint  Patient presents with   Consult    Epworth 10    Referring provider: Anabel Halon, MD  HPI: 77 year old female, current smoker. PMH significant for afib, HTN, CAD, HR with reduced EF, OSA, COPD, chronic respiratory failure, GERD, type 2 diabetes, hypothyroidism, CKD, morbid obesity.   11/14/2022 Patient presents today for sleep consult. Accompanied by her daughter. She has a history of sleep apnea and was previous on CPAP. She tells me that her machine broke roughly 5 year ago and she ha repeat sleep testing and was not able to get a replacement CPAP. She complains of fatigue. Reports trouble falling and staying asleep. She wakes up several times a night gasping for air. She takes Ambien for insomnia occasionally with good effect. She sleeps in recliner. Typical bedtime is midnight. It can take her hours to fall asleep some night. She wakes up at least 3 times throughout the night. She starts her day at 10am. Weight has fluctuated +/- 20 lbs. No acute respiratory complaints. She is a current smoker. Not on oxygen. Maintained on Stiolto and Flovebnt, managed by PCP.   Imaging: 03/13/21 CTA abd/pelvis >> lung bases clear  Pulmonary function test: 09/25/2017 PFT>> FVC 2.40 (83%), FEV1 1.77 (81%), ratio 74 Mild obstructive airways disease with mild diffusion defect  No Known Allergies  Immunization History  Administered Date(s) Administered   Fluad Quad(high Dose 65+) 02/09/2020, 04/13/2022   Influenza Split 04/10/2010   Influenza-Unspecified 03/06/2021   Moderna Sars-Covid-2 Vaccination 12/15/2019, 02/09/2020   PNEUMOCOCCAL CONJUGATE-20 03/10/2021    Past Medical History:  Diagnosis Date   Acute ischemic colitis (HCC) 09/11/2005   Anxiety    Arthritis    Atherosclerotic vascular disease    Calcified plaque at the origin of the celiac and SMA   CHF (congestive heart  failure) (HCC)    Coronary atherosclerosis of native coronary artery    Mulitvessel LVEF 50-50%, DES circ 1/11, occluded SVG to OM and SVG to diagonal , LVEF 60%   COVID-19 virus infection 11/07/2020   Depression    Diverticulosis of colon    Essential hypertension    Hypothyroidism    Mesenteric ischemia (HCC)    Mitral regurgitation    Mild to moderate   Mixed hyperlipidemia    Myocardial infarction (HCC) 1990   Obesity    Orthostatic hypotension    PAD (peripheral artery disease) (HCC)    PVC's (premature ventricular contractions)    Schizophrenia (HCC)    Sleep apnea    CPAP   TIA (transient ischemic attack) 11/20/2017   Tubular adenoma    Type 2 diabetes mellitus (HCC)    Vascular complications of mesenteric artery     Tobacco History: Social History   Tobacco Use  Smoking Status Every Day   Current packs/day: 0.50   Average packs/day: 0.5 packs/day for 50.0 years (25.0 ttl pk-yrs)   Types: Cigarettes   Start date: 12/30/2018   Passive exposure: Never  Smokeless Tobacco Never   Ready to quit: Not Answered Counseling given: Not Answered   Outpatient Medications Prior to Visit  Medication Sig Dispense Refill   albuterol (VENTOLIN HFA) 108 (90 Base) MCG/ACT inhaler Inhale 1 puff into the lungs every 6 (six) hours as needed for wheezing or shortness of breath.     ALPRAZolam (XANAX) 0.5 MG tablet TAKE 1 TABLET BY MOUTH TWICE DAILY AS  NEEDED FOR ANXIETY 60 tablet 2   amLODipine (NORVASC) 10 MG tablet Take 10 mg by mouth daily.     apixaban (ELIQUIS) 5 MG TABS tablet Take 1 tablet (5 mg total) by mouth 2 (two) times daily. 60 tablet 6   aspirin EC 81 MG tablet Take 81 mg by mouth at bedtime.     Biotin 36644 MCG TABS Take 1 tablet by mouth daily.     blood glucose meter kit and supplies Dispense based on patient and insurance preference. Use up to four times daily as directed. (FOR ICD-10 E10.9, E11.9). 1 each 0   calcitRIOL (ROCALTROL) 0.25 MCG capsule Take 1 capsule  by mouth 2 (two) times a week.     Cholecalciferol (VITAMIN D3) 50 MCG (2000 UT) TABS Take 1 tablet by mouth daily.     citalopram (CELEXA) 20 MG tablet TAKE ONE (1) TABLET BY MOUTH EVERY DAY 90 tablet 1   Collagen-Vitamin C (COLLAGEN PLUS VITAMIN C PO) Take 1 tablet by mouth daily.     dapagliflozin propanediol (FARXIGA) 5 MG TABS tablet Take 5 mg by mouth daily.     diphenoxylate-atropine (LOMOTIL) 2.5-0.025 MG tablet Take 1 tablet by mouth 2 (two) times daily as needed for diarrhea or loose stools.     famotidine (PEPCID) 40 MG tablet Take 1 tablet (40 mg total) by mouth daily as needed for heartburn or indigestion. 30 tablet 5   fluticasone (FLOVENT HFA) 110 MCG/ACT inhaler Inhale 2 puffs into the lungs 2 (two) times daily.     ipratropium (ATROVENT) 0.03 % nasal spray Place 2 sprays into both nostrils every 12 (twelve) hours. 30 mL 12   ipratropium-albuterol (DUONEB) 0.5-2.5 (3) MG/3ML SOLN Take 3 mLs by nebulization 4 times daily and as needed for shortness of breath or wheezing. 450 mL 11   isosorbide mononitrate (IMDUR) 60 MG 24 hr tablet TAKE ONE (1) TABLET EACH DAY 90 tablet 1   levothyroxine (SYNTHROID) 50 MCG tablet Take 1 tablet (50 mcg total) by mouth daily before breakfast. 30 tablet 11   Melatonin 10 MG TABS Take 10 mg by mouth at bedtime as needed (sleep).     metoprolol tartrate (LOPRESSOR) 50 MG tablet TAKE ONE (1) TABLET BY MOUTH TWO (2) TIMES DAILY 180 tablet 0   nitroGLYCERIN (NITROSTAT) 0.4 MG SL tablet Place 1 tablet (0.4 mg total) under the tongue every 5 (five) minutes x 3 doses as needed for chest pain. 25 tablet 3   ondansetron (ZOFRAN ODT) 4 MG disintegrating tablet Take 1 tablet (4 mg total) by mouth every 8 (eight) hours as needed for nausea or vomiting. 20 tablet 0   OneTouch Delica Lancets 33G MISC 4 TIMES DAILY 100 each 5   ONETOUCH VERIO test strip 4 TIMES DAILY 100 each 5   pantoprazole (PROTONIX) 40 MG tablet TAKE ONE TABLET BY MOUTH EVERY MORNING 90 tablet 1    pravastatin (PRAVACHOL) 80 MG tablet Take 1 tablet (80 mg total) by mouth daily. 90 tablet 3   promethazine-dextromethorphan (PROMETHAZINE-DM) 6.25-15 MG/5ML syrup Take 5 mLs by mouth 4 (four) times daily as needed for cough. 118 mL 0   Tiotropium Bromide-Olodaterol (STIOLTO RESPIMAT) 2.5-2.5 MCG/ACT AERS Inhale 2 puffs into the lungs daily. 4 g 11   torsemide (DEMADEX) 20 MG tablet Take 1 tablet (20 mg total) by mouth daily. 30 tablet 5   triamcinolone cream (KENALOG) 0.1 % Apply 1 Application topically 2 (two) times daily. 30 g 0   valsartan (DIOVAN) 40  MG tablet Take 80 mg by mouth daily.     zolpidem (AMBIEN) 10 MG tablet TAKE 1 TABLET BY MOUTH AT BEDTIME AS NEEDED FOR SLEEP 30 tablet 2   Facility-Administered Medications Prior to Visit  Medication Dose Route Frequency Provider Last Rate Last Admin   sodium chloride flush (NS) 0.9 % injection 3 mL  3 mL Intravenous Q12H Jonelle Sidle, MD          Review of Systems  Review of Systems  Constitutional:  Positive for fatigue and unexpected weight change.  Respiratory:         DOE  Psychiatric/Behavioral:  Positive for sleep disturbance.     Physical Exam  BP 112/68 (BP Location: Left Arm, Patient Position: Sitting, Cuff Size: Large)   Pulse 61   Ht 5\' 3"  (1.6 m)   Wt 210 lb (95.3 kg)   SpO2 97%   BMI 37.20 kg/m  Physical Exam Constitutional:      Appearance: Normal appearance. She is obese.  HENT:     Head: Normocephalic and atraumatic.     Mouth/Throat:     Mouth: Mucous membranes are moist.     Pharynx: Oropharynx is clear.  Cardiovascular:     Rate and Rhythm: Normal rate and regular rhythm.  Pulmonary:     Effort: Pulmonary effort is normal.     Breath sounds: Normal breath sounds.  Musculoskeletal:        General: Normal range of motion.  Skin:    General: Skin is warm and dry.  Neurological:     General: No focal deficit present.     Mental Status: She is alert and oriented to person, place, and time.  Mental status is at baseline.  Psychiatric:        Mood and Affect: Mood normal.        Behavior: Behavior normal.        Thought Content: Thought content normal.        Judgment: Judgment normal.      Lab Results:  CBC    Component Value Date/Time   WBC 6.9 09/12/2021 1529   WBC 5.8 03/13/2021 2240   RBC 4.33 09/12/2021 1529   RBC 3.78 (L) 03/13/2021 2240   HGB 13.7 09/12/2021 1529   HCT 41.2 09/12/2021 1529   HCT 41 02/14/2012 0957   PLT 269 09/12/2021 1529   MCV 95 09/12/2021 1529   MCH 31.6 09/12/2021 1529   MCH 31.5 03/13/2021 2240   MCHC 33.3 09/12/2021 1529   MCHC 32.9 03/13/2021 2240   RDW 13.4 09/12/2021 1529   LYMPHSABS 2.6 09/12/2021 1529   MONOABS 0.7 03/13/2021 2240   EOSABS 0.2 09/12/2021 1529   BASOSABS 0.1 09/12/2021 1529    BMET    Component Value Date/Time   NA 139 09/12/2021 1529   K 5.9 (H) 09/12/2021 1529   K 3.4 02/14/2012 0955   CL 100 09/12/2021 1529   CL 102 02/14/2012 0955   CO2 25 09/12/2021 1529   GLUCOSE 105 (H) 09/12/2021 1529   GLUCOSE 102 (H) 03/13/2021 2240   BUN 16 09/12/2021 1529   CREATININE 1.27 (H) 09/12/2021 1529   CREATININE 1.04 (H) 10/15/2019 1125   CALCIUM 10.2 09/12/2021 1529   CALCIUM 9.1 02/14/2012 0955   GFRNONAA 56 (L) 03/13/2021 2240   GFRNONAA 53 (L) 10/15/2019 1125   GFRAA 61 10/15/2019 1125    BNP No results found for: "BNP"  ProBNP No results found for: "PROBNP"  Imaging: ECHOCARDIOGRAM COMPLETE  Result Date: 11/02/2022    ECHOCARDIOGRAM REPORT   Patient Name:   BUNNIE REHBERG Date of Exam: 11/02/2022 Medical Rec #:  [id number]         Height:       63.0 in Accession #:    [id number]        Weight:       213.0 lb Date of Birth:  1945/10/27        BSA:          1.986 m Patient Age:    23 years          BP:           126/85 mmHg Patient Gender: F                 HR:           75 bpm. [facility] Procedure: 2D Echo, 3D Echo, Cardiac Doppler, Color Doppler and Strain Analysis Indications:     R06.02 SOB  History:        Patient has prior history of Echocardiogram examinations, most                 recent 06/14/2020. CAD, Abnormal ECG, COPD, Mitral Valve Disease,                 Arrythmias:Atrial Fibrillation, Signs/Symptoms:Shortness of                 Breath and Dyspnea; Risk Factors:Current Smoker. Mitral                 regurgitation.  Sonographer:    Sheralyn Boatman RDCS Referring Phys: [id number] [facility]  Sonographer Comments: Image acquisition challenging due to patient body habitus and Image acquisition challenging due to COPD. IMPRESSIONS  1. Left ventricular ejection fraction, by estimation, is 45 to 50%. The left ventricle has mildly decreased function. The left ventricle demonstrates regional wall motion abnormalities (see scoring diagram/findings for description). There is mild left ventricular hypertrophy. Left ventricular diastolic parameters are indeterminate.  2. Right ventricular systolic function is low normal. The right ventricular size is normal. There is normal pulmonary artery systolic pressure. The estimated right ventricular systolic pressure is 31.8 mmHg.  3. The mitral valve is degenerative. Mild to moderate mitral valve regurgitation.  4. Tricuspid valve regurgitation is moderate.  5. The aortic valve is tricuspid. Aortic valve regurgitation is not visualized. Aortic valve sclerosis is present, with no evidence of aortic valve stenosis.  6. Aortic dilatation noted. There is mild dilatation of the ascending aorta, measuring 41 mm.  7. The inferior vena cava is dilated in size with >50% respiratory variability, suggesting right atrial pressure of 8 mmHg. Comparison(s): Prior images reviewed side by side. LVEF stable in 45-50% range. Normal estimated RVSP. Mild to moderate mitral and moderate tricuspid regurgitation. FINDINGS  Left Ventricle: Left ventricular ejection fraction, by estimation, is 45 to 50%. The left ventricle has mildly decreased function. The left ventricle  demonstrates regional wall motion abnormalities. The left ventricular internal cavity size was normal in size. There is mild left ventricular hypertrophy. Left ventricular diastolic function could not be evaluated due to atrial fibrillation. Left ventricular diastolic parameters are indeterminate.  LV Wall Scoring: The posterior wall, basal inferior segment, and basal inferoseptal segment are hypokinetic. The entire anterior wall, antero-lateral wall, entire anterior septum, entire apex, mid and distal inferior wall, and mid inferoseptal segment are normal. Right Ventricle: The right ventricular size is normal. No increase  in right ventricular wall thickness. Right ventricular systolic function is low normal. There is normal pulmonary artery systolic pressure. The tricuspid regurgitant velocity is 2.44 m/s,  and with an assumed right atrial pressure of 8 mmHg, the estimated right ventricular systolic pressure is 31.8 mmHg. Left Atrium: Left atrial size was normal in size. Right Atrium: Right atrial size was normal in size. Pericardium: Trivial pericardial effusion is present. Mitral Valve: The mitral valve is degenerative in appearance. There is mild calcification of the mitral valve leaflet(s). Mild mitral annular calcification. Mild to moderate mitral valve regurgitation. Tricuspid Valve: The tricuspid valve is grossly normal. Tricuspid valve regurgitation is moderate. Aortic Valve: The aortic valve is tricuspid. There is mild to moderate aortic valve annular calcification. Aortic valve regurgitation is not visualized. Aortic valve sclerosis is present, with no evidence of aortic valve stenosis. Pulmonic Valve: The pulmonic valve was grossly normal. Pulmonic valve regurgitation is not visualized. Aorta: The aortic root is normal in size and structure and aortic dilatation noted. There is mild dilatation of the ascending aorta, measuring 41 mm. Venous: The inferior vena cava is dilated in size with greater than 50%  respiratory variability, suggesting right atrial pressure of 8 mmHg. IAS/Shunts: No atrial level shunt detected by color flow Doppler.  LEFT VENTRICLE PLAX 2D LVIDd:         4.30 cm LVIDs:         3.50 cm LV PW:         1.60 cm LV IVS:        1.20 cm LVOT diam:     2.10 cm     3D Volume EF: LV SV:         44          3D EF:        49 % LV SV Index:   22          LV EDV:       103 ml LVOT Area:     3.46 cm    LV ESV:       53 ml                            LV SV:        50 ml  LV Volumes (MOD) LV vol d, MOD A2C: 57.3 ml LV vol d, MOD A4C: 67.5 ml LV vol s, MOD A2C: 35.8 ml LV vol s, MOD A4C: 36.9 ml LV SV MOD A2C:     21.5 ml LV SV MOD A4C:     67.5 ml LV SV MOD BP:      26.7 ml RIGHT VENTRICLE            IVC RV S prime:     4.80 cm/s  IVC diam: 2.10 cm TAPSE (M-mode): 1.4 cm LEFT ATRIUM             Index        RIGHT ATRIUM           Index LA diam:        3.90 cm 1.96 cm/m   RA Area:     17.20 cm LA Vol (A2C):   52.0 ml 26.18 ml/m  RA Volume:   47.00 ml  23.66 ml/m LA Vol (A4C):   39.2 ml 19.74 ml/m LA Biplane Vol: 46.5 ml 23.41 ml/m  AORTIC VALVE LVOT Vmax:   73.80 cm/s LVOT Vmean:  47.600 cm/s LVOT  VTI:    0.127 m  AORTA Ao Root diam: 3.40 cm Ao Asc diam:  3.95 cm MITRAL VALVE                  TRICUSPID VALVE MV Area (PHT): 4.40 cm       TR Peak grad:   23.8 mmHg MV Decel Time: 173 msec       TR Vmax:        244.00 cm/s MR Peak grad:    94.1 mmHg MR Mean grad:    62.0 mmHg    SHUNTS MR Vmax:         485.00 cm/s  Systemic VTI:  0.13 m MR Vmean:        373.0 cm/s   Systemic Diam: 2.10 cm MR PISA:         1.27 cm MR PISA Eff ROA: 10 mm MR PISA Radius:  0.45 cm MV E velocity: 95.45 cm/s Nona Dell MD Electronically signed by Nona Dell MD Signature Date/Time: 11/02/2022/12:12:45 PM    Final      Assessment & Plan:   OSA (obstructive sleep apnea) - Hx sleep apnea, previously on CPAP. Having symptoms loud snoring, waking up gasping for air and daytime sleepiness. Epworth 10. Patient needs  polysomnography, patient prefers sleep study be done at home due to anxiety and insomnia. She sleeps in a recliner and would have difficulty sleeping flat. We will check HST. Reviewed risks of untreated sleep apnea including cardiac arrhythmias, pulmonary hypertension, diabetes and stroke.  We also discussed treatment options including weight loss, oral appliance, CPAP therapy or referral to ENT for possible surgical options.  Patient is open to resuming CPAP if needed. Advised patient elevate HOB 30 degrees. Work on weight loss. FU up 1-2 weeks after sleep study to review results.   COPD (chronic obstructive pulmonary disease) (HCC) - Patient has mild obstructive lung disease with positive BD response. No acute respiratory complaints. She is a current smoker. Not on oxygen. Maintained on Stiolto and Flovebnt, managed by PCP.    Glenford Bayley, NP 11/14/2022

## 2022-11-15 ENCOUNTER — Encounter: Payer: Self-pay | Admitting: Internal Medicine

## 2022-11-15 ENCOUNTER — Ambulatory Visit (INDEPENDENT_AMBULATORY_CARE_PROVIDER_SITE_OTHER): Payer: Medicare Other | Admitting: Urology

## 2022-11-15 ENCOUNTER — Encounter: Payer: Self-pay | Admitting: Urology

## 2022-11-15 ENCOUNTER — Ambulatory Visit (INDEPENDENT_AMBULATORY_CARE_PROVIDER_SITE_OTHER): Payer: Medicare Other | Admitting: Internal Medicine

## 2022-11-15 VITALS — BP 102/70 | HR 68 | Ht 63.0 in | Wt 212.8 lb

## 2022-11-15 VITALS — BP 101/67 | HR 70 | Temp 98.4°F

## 2022-11-15 DIAGNOSIS — R3914 Feeling of incomplete bladder emptying: Secondary | ICD-10-CM

## 2022-11-15 DIAGNOSIS — L239 Allergic contact dermatitis, unspecified cause: Secondary | ICD-10-CM | POA: Insufficient documentation

## 2022-11-15 DIAGNOSIS — E1122 Type 2 diabetes mellitus with diabetic chronic kidney disease: Secondary | ICD-10-CM | POA: Diagnosis not present

## 2022-11-15 DIAGNOSIS — E039 Hypothyroidism, unspecified: Secondary | ICD-10-CM

## 2022-11-15 DIAGNOSIS — R3915 Urgency of urination: Secondary | ICD-10-CM

## 2022-11-15 DIAGNOSIS — I10 Essential (primary) hypertension: Secondary | ICD-10-CM

## 2022-11-15 DIAGNOSIS — R3989 Other symptoms and signs involving the genitourinary system: Secondary | ICD-10-CM

## 2022-11-15 DIAGNOSIS — N3281 Overactive bladder: Secondary | ICD-10-CM

## 2022-11-15 DIAGNOSIS — N1832 Chronic kidney disease, stage 3b: Secondary | ICD-10-CM | POA: Diagnosis not present

## 2022-11-15 DIAGNOSIS — Z0001 Encounter for general adult medical examination with abnormal findings: Secondary | ICD-10-CM | POA: Diagnosis not present

## 2022-11-15 DIAGNOSIS — N1831 Chronic kidney disease, stage 3a: Secondary | ICD-10-CM

## 2022-11-15 DIAGNOSIS — J449 Chronic obstructive pulmonary disease, unspecified: Secondary | ICD-10-CM | POA: Diagnosis not present

## 2022-11-15 DIAGNOSIS — R3916 Straining to void: Secondary | ICD-10-CM | POA: Diagnosis not present

## 2022-11-15 DIAGNOSIS — Z720 Tobacco use: Secondary | ICD-10-CM

## 2022-11-15 DIAGNOSIS — I480 Paroxysmal atrial fibrillation: Secondary | ICD-10-CM | POA: Diagnosis not present

## 2022-11-15 DIAGNOSIS — F411 Generalized anxiety disorder: Secondary | ICD-10-CM

## 2022-11-15 DIAGNOSIS — E782 Mixed hyperlipidemia: Secondary | ICD-10-CM | POA: Diagnosis not present

## 2022-11-15 DIAGNOSIS — R1030 Lower abdominal pain, unspecified: Secondary | ICD-10-CM | POA: Diagnosis not present

## 2022-11-15 DIAGNOSIS — Z122 Encounter for screening for malignant neoplasm of respiratory organs: Secondary | ICD-10-CM

## 2022-11-15 DIAGNOSIS — Z13820 Encounter for screening for osteoporosis: Secondary | ICD-10-CM

## 2022-11-15 LAB — URINALYSIS, ROUTINE W REFLEX MICROSCOPIC
Bilirubin, UA: NEGATIVE
Glucose, UA: NEGATIVE
Ketones, UA: NEGATIVE
Leukocytes,UA: NEGATIVE
Nitrite, UA: NEGATIVE
Protein,UA: NEGATIVE
RBC, UA: NEGATIVE
Specific Gravity, UA: 1.025 (ref 1.005–1.030)
Urobilinogen, Ur: 0.2 mg/dL (ref 0.2–1.0)
pH, UA: 6 (ref 5.0–7.5)

## 2022-11-15 LAB — BLADDER SCAN AMB NON-IMAGING: Scan Result: 9

## 2022-11-15 MED ORDER — BUPROPION HCL 75 MG PO TABS
75.0000 mg | ORAL_TABLET | Freq: Two times a day (BID) | ORAL | 1 refills | Status: DC
Start: 2022-11-15 — End: 2023-01-07

## 2022-11-15 NOTE — Assessment & Plan Note (Signed)
Physical exam as documented. Fasting blood tests today. Advised to get Shingrix and Tdap vaccines at local pharmacy. 

## 2022-11-15 NOTE — Assessment & Plan Note (Signed)
Smokes about 1 pack/week  Asked about quitting: confirms that he/she currently smokes cigarettes Advise to quit smoking: Educated about QUITTING to reduce the risk of cancer, cardio and cerebrovascular disease. Assess willingness: Unwilling to quit at this time, but is working on cutting back. Assist with counseling and pharmacotherapy: Counseled for 5 minutes and literature provided. Started Wellbutrin. Arrange for follow up: follow up in 3 months and continue to offer help.

## 2022-11-15 NOTE — Assessment & Plan Note (Addendum)
Has diffuse itching and irritation, has tried Kenalog cream without much relief Also has multiple skin lesions, likely actinic keratosis Referred to Dermatology

## 2022-11-15 NOTE — Patient Instructions (Signed)
INTERSTITIAL CYSTITIS DIET  Many people with Interstitial Cystitis find that simple changes in their diet can help to control IC symptoms and avoid IC flare-ups. Typically, avoiding foods high in acid and potassium, as well as beverages containing caffeine and alcohol, is a good idea. This helpful guide can help you make "IC-Smart" meal choices. Keep it handy for easy reference when dining out or when preparing meals at home.  FRUITS: Allowable - Blueberries, Melons (except cantaloupe), Minute Maid Acid-Reduced Orange Juice (this can be diluted using water or Prelief* to prevent a flare-up) and Pears.  Avoidable -All other Fruits and Juices.  VEGETABLES:  Allowable - Homegrown Tomatoes, Acorn, Alfalfa, Beet, Broccoli, Bok Choy, [address], [place], [place], [place], [place], [place], [place], [place], [place], Mustard Maili and [employer].  Avoidable - Store-bought Tomatoes, Onions, Tofu, Soybeans, Lima beans, Fava Beans, Asparagus, Beet Greens, [place], [place], [place], [place], [place], [place], Mushrooms, [place], [place], [place], [place], [employer] and Rhubarb.  MEATS/FISH: Allowable - Beef, Chicken, [place], [place], [address], [address], [place], [place], [place], [place], [place], [place], [place], [place], [place], [place] and Shrimp.  Avoidable - Chicken Liver, Corned Beef, Pickled Herring and Fresh Herring, Aged, [address], Cured, Processed or Smoked Meats/Fish, [place], [place], [address], [place], [place], [place], [place], [place], [place], [place] and meats that contain nitrates or nitrites.  CARBS/GRAINS:  Allowable - Pasta, Rice, Potatoes, White Bread, Bread made of Rice, Millet, Quinoa, Buckwheat, [place], Refined, Cooked or Ready to Eat Cereal.  Avoidable - Rye Bread, Sourdough Breads and Fresh Baked Yeast Products  FATS:    DAIRY: Allowable - Butter, Margarine, Vegetable Oils, Almonds, Cashews and Pine Nuts. Avoidable - Any other Nuts, Mayonnaise, Salad Dressing. Allowable -  Milk, American Cheese, [address], Cream Cheese, String Cheese, [place], Ricotta Cheese, Frozen Yogurt and White Chocolate.   Avoidable - Yogurt, Goat's Milk, Chocolate Milk, Sour Cream, Aged Cheeses, Chocolate, [place], Camembert, Cheddar, [address], [place], [place], [place], [place], [place], [place], [place], [place] and [place].  SOUPS: Allowable - Soups made with allowed Vegetables, Cream of Broccoli, Water Cress Soup, Cream of Cauliflower Soup.  Avoidable -Any packaged Soups.  SEASONINGS: Allowable - Garlic and other seasonings not listed below.  Avoid - Miso, Soy Sauce, Vinegar and any Spicy Foods (especially [place], [place], [place] and [place] foods)  BEVERAGES:  Allowable - Bottled or Spring Water, Decaffeinated, Acid-free or Low Acid Coffee or Tea, Alfalfa Leaf Tea, Sun Tea, Kava Instant Coffee, Peppermint Tea, Golden Seal Tea, Flat Soda and Low Acid Wines.  Avoid -Alcoholic Beverages (except Low Acid Wines), Carbonated Drinks such as Soda, Coffee and Tea (except what is listed above), Fruit Juices and Chamomile Tea.  PRESERVATIVE: Avoid - Benzol Alcohol, Citric Acid, Monosodium Glutamate (MSG), Aspartame, Saccharin and foods containing Artificial Ingredients/Colors.

## 2022-11-15 NOTE — Assessment & Plan Note (Signed)
On pravastatin, needs to be compliant

## 2022-11-15 NOTE — Progress Notes (Signed)
Established Patient Office Visit  Subjective:  Patient ID: [name], female    DOB: 1946/02/26  Age: 77 y.o. MRN: [id number]  CC:  Chief Complaint  Patient presents with   Annual Exam    HPI [name] is a 77 y.o. female with past medical history of hypertension, hyperlipidemia, mesenteric ischemia status post right hemicolectomy, coronary artery disease, hypothyroidism, type 2 diabetes mellitus, depression with anxiety, obesity, arthritis, chronic back pain who presents for annual physical.  HTN and A-fib: BP is low normal today.  She has been taking valsartan 80 mg QD and Imdur 60 mg QD. She reports persistent dizziness despite stopping amlodipine.  She is also on metoprolol for A-fib.  She is on Eliquis.  She reports that she still takes aspirin and Plavix.  She has easy bruising.  Denies any chest pain or palpitations currently.  She has chronic exertional dyspnea.  MDD and GAD: She had anhedonia, insomnia, lack of interest in routine activities and agitation, but is improving slowly. Denies any SI or HI. She was placed on Celexa, which has helped her with MDD. She takes Xanax for anxiety and takes Ambien PRN for insomnia.  She asks for Wellbutrin for smoking cessation.  She has tolerated it well in the past.  COPD: She has been using Stiolto and as needed albuterol, but does not use it regularly.  She uses home O2 at nighttime for dyspnea and hypoxia (O2 sat less than 88%). She reports worsening of dyspnea for the last 2 months. Denies any fever or chills. C/o chronic nasal congestion and postnasal drip.   She has chronic irritation of the skin and has multiple skin lesions.  She was referred to dermatology, but has not heard from them.     Past Medical History:  Diagnosis Date   Acute ischemic colitis (HCC) 09/11/2005   Anxiety    Arthritis    Atherosclerotic vascular disease    Calcified plaque at the origin of the celiac and SMA   CHF (congestive heart  failure) (HCC)    Coronary atherosclerosis of native coronary artery    Mulitvessel LVEF 50-50%, DES circ 1/11, occluded SVG to OM and SVG to diagonal , LVEF 60%   COVID-19 virus infection 11/07/2020   Depression    Diverticulosis of colon    Essential hypertension    Hypothyroidism    Mesenteric ischemia (HCC)    Mitral regurgitation    Mild to moderate   Mixed hyperlipidemia    Myocardial infarction (HCC) 1990   Obesity    Orthostatic hypotension    PAD (peripheral artery disease) (HCC)    PVC's (premature ventricular contractions)    Schizophrenia (HCC)    Sleep apnea    CPAP   TIA (transient ischemic attack) 11/20/2017   Tubular adenoma    Type 2 diabetes mellitus (HCC)    Vascular complications of mesenteric artery     Past Surgical History:  Procedure Laterality Date   BACK SURGERY     Lumbar spine surgery   Carotid endarectomy Bilateral    CARPAL TUNNEL RELEASE     Bilateral   CATARACT EXTRACTION W/PHACO Right 08/24/2013   Procedure: CATARACT EXTRACTION PHACO AND INTRAOCULAR LENS PLACEMENT (IOC);  Surgeon: Gemma Payor, MD;  Location: [facility];  Service: Ophthalmology;  Laterality: Right;  CDE 8.68   COLONOSCOPY  09/14/2005   Dr. Jacqlyn Larsen colitis   COLONOSCOPY WITH ESOPHAGOGASTRODUODENOSCOPY (EGD)  04/14/2012   Dr. Jena Gauss- EGD= gastric erosions of doubtful clinical significance  per bx- chronic inactive gastritis, benign small bowel mucosa. TCS=colonic diverticulosis, tubular adenoma   CORONARY ARTERY BYPASS GRAFT     LIMA-LAD; SVG-OM; SVG-DX in [facility]   NECK SURGERY     Cervical laminectomy   PARTIAL HYSTERECTOMY     RIGHT/LEFT HEART CATH AND CORONARY/GRAFT ANGIOGRAPHY N/A 04/12/2021   Procedure: RIGHT/LEFT HEART CATH AND CORONARY/GRAFT ANGIOGRAPHY;  Surgeon: Iran Ouch, MD;  Location: [facility];  Service: Cardiovascular;  Laterality: N/A;   stents x4      Family History  Problem Relation Age of Onset   Alcohol abuse Mother    Alcohol  abuse Father    Coronary artery disease Other    Hypertension Other    Crohn's disease Sister 50   Stroke Daughter     Social History   Socioeconomic History   Marital status: Widowed    Spouse name: Not on file   Number of children: 4   Years of education: Not on file   Highest education level: Not on file  Occupational History   Occupation: DISABLED    Employer: UNEMPLOYED  Tobacco Use   Smoking status: Every Day    Current packs/day: 0.50    Average packs/day: 0.5 packs/day for 50.0 years (25.0 ttl pk-yrs)    Types: Cigarettes    Start date: 12/30/2018    Passive exposure: Never   Smokeless tobacco: Never  Vaping Use   Vaping status: Never Used  Substance and Sexual Activity   Alcohol use: Yes    Alcohol/week: 0.0 standard drinks of alcohol    Comment: occasionally    Drug use: No   Sexual activity: Not Currently  Other Topics Concern   Not on file  Social History Narrative   LIves alone   Social Determinants of Health   Financial Resource Strain: Low Risk  (06/05/2022)   [facility], [facility]   Overall Financial Resource Strain (CARDIA)    Difficulty of Paying Living Expenses: Not hard at all  Food Insecurity: No Food Insecurity (06/05/2022)   Received from [facility], [facility]   Hunger Vital Sign    Worried About Running Out of Food in the Last Year: Never true    Ran Out of Food in the Last Year: Never true  Transportation Needs: No Transportation Needs (06/05/2022)   Received from [facility], [facility]   [facility] - Transportation    Lack of Transportation (Medical): No    Lack of Transportation (Non-Medical): No  Physical Activity: Inactive (02/27/2021)   Exercise Vital Sign    Days of Exercise per Week: 0 days    Minutes of Exercise per Session: 0 min  Stress: No Stress Concern Present (02/27/2021)   [employer] of Occupational Health - Occupational Stress Questionnaire    Feeling of Stress : Only a  little  Social Connections: Socially Isolated (02/27/2021)   Social Connection and Isolation Panel [NHANES]    Frequency of Communication with Friends and Family: More than three times a week    Frequency of Social Gatherings with Friends and Family: Never    Attends Religious Services: Never    Database administrator or Organizations: No    Attends Banker Meetings: Never    Marital Status: Widowed  Intimate Partner Violence: Not At Risk (02/27/2021)   Humiliation, Afraid, Rape, and Kick questionnaire    Fear of Current or Ex-Partner: No    Emotionally Abused: No  Physically Abused: No    Sexually Abused: No    Outpatient Medications Prior to Visit  Medication Sig Dispense Refill   albuterol (VENTOLIN HFA) 108 (90 Base) MCG/ACT inhaler Inhale 1 puff into the lungs every 6 (six) hours as needed for wheezing or shortness of breath.     ALPRAZolam (XANAX) 0.5 MG tablet TAKE 1 TABLET BY MOUTH TWICE DAILY AS NEEDED FOR ANXIETY 60 tablet 2   apixaban (ELIQUIS) 5 MG TABS tablet Take 1 tablet (5 mg total) by mouth 2 (two) times daily. 60 tablet 6   aspirin EC 81 MG tablet Take 81 mg by mouth at bedtime.     Biotin 16109 MCG TABS Take 1 tablet by mouth daily. (Patient not taking: Reported on 11/15/2022)     blood glucose meter kit and supplies Dispense based on patient and insurance preference. Use up to four times daily as directed. (FOR ICD-10 E10.9, E11.9). 1 each 0   calcitRIOL (ROCALTROL) 0.25 MCG capsule Take 1 capsule by mouth 2 (two) times a week.     Cholecalciferol (VITAMIN D3) 50 MCG (2000 UT) TABS Take 1 tablet by mouth daily. (Patient not taking: Reported on 11/15/2022)     citalopram (CELEXA) 20 MG tablet TAKE ONE (1) TABLET BY MOUTH EVERY DAY 90 tablet 1   Collagen-Vitamin C (COLLAGEN PLUS VITAMIN C PO) Take 1 tablet by mouth daily. (Patient not taking: Reported on 11/15/2022)     dapagliflozin propanediol (FARXIGA) 5 MG TABS tablet Take 5 mg by mouth daily. (Patient  not taking: Reported on 11/15/2022)     diphenoxylate-atropine (LOMOTIL) 2.5-0.025 MG tablet Take 1 tablet by mouth 2 (two) times daily as needed for diarrhea or loose stools.     famotidine (PEPCID) 40 MG tablet Take 1 tablet (40 mg total) by mouth daily as needed for heartburn or indigestion. 30 tablet 5   fluticasone (FLOVENT HFA) 110 MCG/ACT inhaler Inhale 2 puffs into the lungs 2 (two) times daily.     ipratropium (ATROVENT) 0.03 % nasal spray Place 2 sprays into both nostrils every 12 (twelve) hours. 30 mL 12   ipratropium-albuterol (DUONEB) 0.5-2.5 (3) MG/3ML SOLN Take 3 mLs by nebulization 4 times daily and as needed for shortness of breath or wheezing. 450 mL 11   isosorbide mononitrate (IMDUR) 60 MG 24 hr tablet TAKE ONE (1) TABLET EACH DAY 90 tablet 1   levothyroxine (SYNTHROID) 50 MCG tablet Take 1 tablet (50 mcg total) by mouth daily before breakfast. (Patient not taking: Reported on 11/15/2022) 30 tablet 11   Melatonin 10 MG TABS Take 10 mg by mouth at bedtime as needed (sleep).     metoprolol tartrate (LOPRESSOR) 50 MG tablet TAKE ONE (1) TABLET BY MOUTH TWO (2) TIMES DAILY 180 tablet 0   nitroGLYCERIN (NITROSTAT) 0.4 MG SL tablet Place 1 tablet (0.4 mg total) under the tongue every 5 (five) minutes x 3 doses as needed for chest pain. 25 tablet 3   ondansetron (ZOFRAN ODT) 4 MG disintegrating tablet Take 1 tablet (4 mg total) by mouth every 8 (eight) hours as needed for nausea or vomiting. 20 tablet 0   OneTouch Delica Lancets 33G MISC 4 TIMES DAILY 100 each 5   ONETOUCH VERIO test strip 4 TIMES DAILY 100 each 5   pantoprazole (PROTONIX) 40 MG tablet TAKE ONE TABLET BY MOUTH EVERY MORNING 90 tablet 1   pravastatin (PRAVACHOL) 80 MG tablet Take 1 tablet (80 mg total) by mouth daily. 90 tablet 3   promethazine-dextromethorphan (  PROMETHAZINE-DM) 6.25-15 MG/5ML syrup Take 5 mLs by mouth 4 (four) times daily as needed for cough. 118 mL 0   Tiotropium Bromide-Olodaterol (STIOLTO RESPIMAT)  2.5-2.5 MCG/ACT AERS Inhale 2 puffs into the lungs daily. 4 g 11   torsemide (DEMADEX) 20 MG tablet Take 1 tablet (20 mg total) by mouth daily. 30 tablet 5   triamcinolone cream (KENALOG) 0.1 % Apply 1 Application topically 2 (two) times daily. 30 g 0   valsartan (DIOVAN) 40 MG tablet Take 80 mg by mouth daily.     zolpidem (AMBIEN) 10 MG tablet TAKE 1 TABLET BY MOUTH AT BEDTIME AS NEEDED FOR SLEEP 30 tablet 2   amLODipine (NORVASC) 10 MG tablet Take 10 mg by mouth daily.     Facility-Administered Medications Prior to Visit  Medication Dose Route Frequency Provider Last Rate Last Admin   sodium chloride flush (NS) 0.9 % injection 3 mL  3 mL Intravenous Q12H Jonelle Sidle, MD        No Known Allergies  ROS Review of Systems  Constitutional:  Negative for chills and fever.  HENT:  Negative for congestion, sinus pressure and sinus pain.   Eyes:  Negative for pain and discharge.  Respiratory:  Positive for cough and shortness of breath.   Cardiovascular:  Positive for leg swelling. Negative for chest pain and palpitations.  Gastrointestinal:  Positive for abdominal pain and nausea. Negative for diarrhea and vomiting.  Genitourinary:  Negative for dysuria and hematuria.  Musculoskeletal:  Positive for arthralgias, back pain, gait problem and joint swelling.  Skin:        Bruising over UE and LE  Neurological:  Positive for weakness. Negative for headaches.  Hematological:  Bruises/bleeds easily.  Psychiatric/Behavioral:  Positive for agitation and sleep disturbance. Negative for behavioral problems. The patient is nervous/anxious.       Objective:    Physical Exam Vitals reviewed.  Constitutional:      General: She is not in acute distress.    Appearance: She is obese. She is not diaphoretic.  HENT:     Head: Normocephalic and atraumatic.     Nose: Congestion present.     Mouth/Throat:     Mouth: Mucous membranes are moist.     Pharynx: No posterior oropharyngeal erythema.   Eyes:     General: No scleral icterus.    Extraocular Movements: Extraocular movements intact.  Cardiovascular:     Rate and Rhythm: Normal rate. Rhythm irregular.     Pulses: Normal pulses.     Heart sounds: Normal heart sounds. No murmur heard. Pulmonary:     Breath sounds: No wheezing or rales.  Abdominal:     Palpations: Abdomen is soft.     Tenderness: There is no abdominal tenderness.  Musculoskeletal:     Cervical back: Normal range of motion and neck supple. No rigidity or tenderness.     Right lower leg: No edema.     Left lower leg: No edema.  Skin:    General: Skin is warm.     Findings: Bruising (over b/l UE and LE) present.     Comments: Multiple whitish skin eruptions over b/l UE and back -likely actinic keratosis  Neurological:     General: No focal deficit present.     Mental Status: She is alert and oriented to person, place, and time.     Sensory: No sensory deficit.     Motor: No weakness.     Gait: Gait abnormal (Likely in the  setting of baseline hip pain).  Psychiatric:        Mood and Affect: Mood normal.        Behavior: Behavior normal.     BP 102/70 (BP Location: Left Arm, Patient Position: Sitting, Cuff Size: Normal)   Pulse 68   Ht 5\' 3"  (1.6 m)   Wt 212 lb 12.8 oz (96.5 kg)   SpO2 96%   BMI 37.70 kg/m  Wt Readings from Last 3 Encounters:  11/15/22 212 lb 12.8 oz (96.5 kg)  11/14/22 210 lb (95.3 kg)  09/17/22 213 lb (96.6 kg)    Lab Results  Component Value Date   TSH 3.450 09/12/2021   Lab Results  Component Value Date   WBC 6.9 09/12/2021   HGB 13.7 09/12/2021   HCT 41.2 09/12/2021   MCV 95 09/12/2021   PLT 269 09/12/2021   Lab Results  Component Value Date   NA 139 09/12/2021   K 5.9 (H) 09/12/2021   CO2 25 09/12/2021   GLUCOSE 105 (H) 09/12/2021   BUN 16 09/12/2021   CREATININE 1.27 (H) 09/12/2021   BILITOT 0.7 09/12/2021   ALKPHOS 114 09/12/2021   AST 30 09/12/2021   ALT 13 09/12/2021   PROT 7.6 09/12/2021    ALBUMIN 4.4 09/12/2021   CALCIUM 10.2 09/12/2021   ANIONGAP 9 03/13/2021   EGFR 44 (L) 09/12/2021   Lab Results  Component Value Date   CHOL 137 09/12/2021   Lab Results  Component Value Date   HDL 46 09/12/2021   Lab Results  Component Value Date   LDLCALC 68 09/12/2021   Lab Results  Component Value Date   TRIG 128 09/12/2021   Lab Results  Component Value Date   CHOLHDL 3.0 09/12/2021   Lab Results  Component Value Date   HGBA1C 6.0 04/13/2022      Assessment & Plan:   Problem List Items Addressed This Visit       Cardiovascular and Mediastinum   Benign essential hypertension    BP Readings from Last 1 Encounters:  11/15/22 102/70   Overall very tightly controlled considering her age Advised to decrease dose of Imdur to 30 mg every day and needs to take valsartan only 40 mg daily Counseled for compliance with the medications Advised DASH diet      Atrial fibrillation (HCC)    Recently diagnosed On Eliquis now, needs to stop taking Plavix Advised to ask cardiology for aspirin Currently rate controlled, was on metoprolol Followed by Cardiology        Respiratory   COPD (chronic obstructive pulmonary disease) (HCC)    Well-controlled with Stiolto and PRN Albuterol Needs to use maintenance inhalers regularly She would benefit from using nebulizer treatments, has DuoNeb Promethazine DM syrup as needed for cough Needs to cut down -> quit smoking        Endocrine   DM2 (diabetes mellitus, type 2) (HCC)     Lab Results  Component Value Date   HGBA1C 6.0 04/13/2022    Diet controlled Avoid tighter control in her case due to risk of hypoglycemia related fall      Relevant Orders   Hemoglobin A1c   CMP14+EGFR   Hypothyroidism    Lab Results  Component Value Date   TSH 3.450 09/12/2021   On Levothyroxine 50 mcg - stopped taking it recently, advised to take it regularly Check TSH and free T4, adjust dose accordingly      Relevant Orders    TSH +  free T4     Musculoskeletal and Integument   Allergic contact dermatitis    Has diffuse itching and irritation, has tried Kenalog cream without much relief Also has multiple skin lesions, likely actinic keratosis Referred to Dermatology      Relevant Orders   Ambulatory referral to Dermatology     Genitourinary   Stage 3 chronic kidney disease (HCC)    Likely due to HTN and DM On Torsemide for HFrEF, takes it PRN for leg swelling Avoid nephrotoxic agents including NSAIDs Followed by Dr. Wolfgang Phoenix - last BMP and visit note reviewed      Relevant Orders   VITAMIN D 25 Hydroxy (Vit-D Deficiency, Fractures)   CMP14+EGFR   CBC with Differential/Platelet     Other   HLD (hyperlipidemia)    On pravastatin, needs to be compliant      Relevant Orders   Lipid panel   Tobacco abuse    Smokes about 1 pack/week  Asked about quitting: confirms that he/she currently smokes cigarettes Advise to quit smoking: Educated about QUITTING to reduce the risk of cancer, cardio and cerebrovascular disease. Assess willingness: Unwilling to quit at this time, but is working on cutting back. Assist with counseling and pharmacotherapy: Counseled for 5 minutes and literature provided. Started Wellbutrin. Arrange for follow up: follow up in 3 months and continue to offer help.      Relevant Medications   buPROPion (WELLBUTRIN) 75 MG tablet   GAD (generalized anxiety disorder)    Takes Xanax, sometimes twice daily - would avoid increasing dose of Xanax for now as she is also on Ambien On Celexa for MDD and anxiety Added Wellbutrin for smoking cessation, plan to discontinue later Patient also takes Ambien for insomnia. She lives alone and could be a reason for her anxiety as well      Relevant Medications   buPROPion (WELLBUTRIN) 75 MG tablet   Encounter for general adult medical examination with abnormal findings - Primary    Physical exam as documented. Fasting blood tests  today. Advised to get Shingrix and Tdap vaccines at local pharmacy.      Other Visit Diagnoses     Osteoporosis screening       Relevant Orders   DG Bone Density   Screening for lung cancer       Relevant Orders   CT CHEST LUNG CANCER SCREENING LOW DOSE WO CONTRAST       Meds ordered this encounter  Medications   buPROPion (WELLBUTRIN) 75 MG tablet    Sig: Take 1 tablet (75 mg total) by mouth 2 (two) times daily.    Dispense:  60 tablet    Refill:  1    Follow-up: Return in about 4 months (around 03/18/2023) for HTN and GAD.    Anabel Halon, MD

## 2022-11-15 NOTE — Assessment & Plan Note (Addendum)
Likely due to HTN and DM On Torsemide for HFrEF, takes it PRN for leg swelling Avoid nephrotoxic agents including NSAIDs Followed by Dr. Wolfgang Phoenix - last BMP and visit note reviewed

## 2022-11-15 NOTE — Patient Instructions (Signed)
Please start taking Wellbutrin as prescribed for smoking cessation. Please continue it at least 4 weeks after quitting smoking.  Please take only half tablet of Valsartan once daily for now.  Please continue to take medications as prescribed.  Please continue to follow low carb diet and perform moderate exercise/walking at least 150 mins/week.  Please consider getting Shingrix and Tdap vaccine at local pharmacy.

## 2022-11-15 NOTE — Assessment & Plan Note (Signed)
BP Readings from Last 1 Encounters:  11/15/22 102/70   Overall very tightly controlled considering her age Advised to decrease dose of Imdur to 30 mg every day and needs to take valsartan only 40 mg daily Counseled for compliance with the medications Advised DASH diet

## 2022-11-15 NOTE — Assessment & Plan Note (Addendum)
Lab Results  Component Value Date   TSH 3.450 09/12/2021   On Levothyroxine 50 mcg - stopped taking it recently, advised to take it regularly Check TSH and free T4, adjust dose accordingly

## 2022-11-15 NOTE — Assessment & Plan Note (Signed)
Recently diagnosed On Eliquis now, needs to stop taking Plavix Advised to ask cardiology for aspirin Currently rate controlled, was on metoprolol Followed by Cardiology

## 2022-11-15 NOTE — Assessment & Plan Note (Signed)
Takes Xanax, sometimes twice daily - would avoid increasing dose of Xanax for now as she is also on Ambien On Celexa for MDD and anxiety Added Wellbutrin for smoking cessation, plan to discontinue later Patient also takes Ambien for insomnia. She lives alone and could be a reason for her anxiety as well

## 2022-11-15 NOTE — Assessment & Plan Note (Signed)
Well-controlled with Stiolto and PRN Albuterol Needs to use maintenance inhalers regularly She would benefit from using nebulizer treatments, has DuoNeb Promethazine DM syrup as needed for cough Needs to cut down -> quit smoking

## 2022-11-15 NOTE — Progress Notes (Addendum)
MDX tracking #[id number] Confirmation# [id number]

## 2022-11-15 NOTE — Assessment & Plan Note (Signed)
  Lab Results  Component Value Date   HGBA1C 6.0 04/13/2022    Diet controlled Avoid tighter control in her case due to risk of hypoglycemia related fall 

## 2022-11-16 LAB — HEMOGLOBIN A1C: Est. average glucose Bld gHb Est-mCnc: 128 mg/dL

## 2022-11-16 LAB — CMP14+EGFR
ALT: 19 IU/L (ref 0–32)
Albumin: 4 g/dL (ref 3.8–4.8)
BUN: 26 mg/dL (ref 8–27)
Bilirubin Total: 0.5 mg/dL (ref 0.0–1.2)
Glucose: 127 mg/dL — ABNORMAL HIGH (ref 70–99)
Sodium: 138 mmol/L (ref 134–144)
Total Protein: 6.7 g/dL (ref 6.0–8.5)
eGFR: 51 mL/min/{1.73_m2} — ABNORMAL LOW (ref >59)

## 2022-11-16 LAB — CBC WITH DIFFERENTIAL/PLATELET
Basophils Absolute: 0 10*3/uL (ref 0.0–0.2)
Basos: 0 %
EOS (ABSOLUTE): 0.1 10*3/uL (ref 0.0–0.4)
Eos: 2 %
MCH: 32.8 pg (ref 26.6–33.0)
Monocytes Absolute: 0.6 10*3/uL (ref 0.1–0.9)
Neutrophils Absolute: 3.6 10*3/uL (ref 1.4–7.0)
Neutrophils: 59 %
RDW: 12.7 % (ref 11.7–15.4)
WBC: 6.1 10*3/uL (ref 3.4–10.8)

## 2022-11-16 LAB — LIPID PANEL
Cholesterol, Total: 131 mg/dL (ref 100–199)
HDL: 55 mg/dL (ref >39)
LDL Chol Calc (NIH): 59 mg/dL (ref 0–99)
Triglycerides: 91 mg/dL (ref 0–149)

## 2022-11-16 LAB — TSH+FREE T4
Free T4: 0.84 ng/dL (ref 0.82–1.77)
TSH: 2.14 u[IU]/mL (ref 0.450–4.500)

## 2022-11-17 LAB — CMP14+EGFR
AST: 17 IU/L (ref 0–40)
Alkaline Phosphatase: 61 IU/L (ref 44–121)
BUN/Creatinine Ratio: 24 (ref 12–28)
CO2: 22 mmol/L (ref 20–29)
Calcium: 9 mg/dL (ref 8.7–10.3)
Chloride: 102 mmol/L (ref 96–106)
Creatinine, Ser: 1.1 mg/dL — ABNORMAL HIGH (ref 0.57–1.00)
Globulin, Total: 2.7 g/dL (ref 1.5–4.5)
Potassium: 4.4 mmol/L (ref 3.5–5.2)

## 2022-11-17 LAB — CBC WITH DIFFERENTIAL/PLATELET
Hematocrit: 42.1 % (ref 34.0–46.6)
Hemoglobin: 13.9 g/dL (ref 11.1–15.9)
Immature Grans (Abs): 0 10*3/uL (ref 0.0–0.1)
Immature Granulocytes: 0 %
Lymphocytes Absolute: 1.8 10*3/uL (ref 0.7–3.1)
Lymphs: 30 %
MCHC: 33 g/dL (ref 31.5–35.7)
MCV: 99 fL — ABNORMAL HIGH (ref 79–97)
Monocytes: 9 %
Platelets: 165 10*3/uL (ref 150–450)
RBC: 4.24 x10E6/uL (ref 3.77–5.28)

## 2022-11-17 LAB — VITAMIN D 25 HYDROXY (VIT D DEFICIENCY, FRACTURES): Vit D, 25-Hydroxy: 27.3 ng/mL — ABNORMAL LOW (ref 30.0–100.0)

## 2022-11-17 LAB — LIPID PANEL
Chol/HDL Ratio: 2.4 ratio (ref 0.0–4.4)
VLDL Cholesterol Cal: 17 mg/dL (ref 5–40)

## 2022-11-17 LAB — HEMOGLOBIN A1C: Hgb A1c MFr Bld: 6.1 % — ABNORMAL HIGH (ref 4.8–5.6)

## 2022-11-19 ENCOUNTER — Ambulatory Visit: Payer: Medicare Other | Admitting: Nurse Practitioner

## 2022-11-19 ENCOUNTER — Telehealth: Payer: Self-pay

## 2022-11-19 ENCOUNTER — Other Ambulatory Visit: Payer: Self-pay | Admitting: Urology

## 2022-11-19 DIAGNOSIS — N390 Urinary tract infection, site not specified: Secondary | ICD-10-CM

## 2022-11-19 MED ORDER — AMOXICILLIN-POT CLAVULANATE 875-125 MG PO TABS
1.0000 | ORAL_TABLET | Freq: Two times a day (BID) | ORAL | 0 refills | Status: AC
Start: 2022-11-19 — End: 2022-11-26

## 2022-11-19 NOTE — Progress Notes (Signed)
MDX urine culture from 11/15/2022 positive for Enterococcus faecalis; Augmentin prescription sent today.  Advised to follow up for cystoscopy as planned.  Misty Georges, MSN, FNP-C, [facility] Nurse Practitioner [facility] [place]

## 2022-11-19 NOTE — Telephone Encounter (Signed)
Patient's granddaughter [name] is requesting a call from you with more detail about the speific bacteria and why she is taking the antibiotic.  I informed her of  your response to the positive MDX culture for Enterococcus faecalis and anx sent to pharmacy.  She states her grandmother will not take it without more information.  Best call back is [phone]

## 2022-11-19 NOTE — Telephone Encounter (Signed)
-----   Message from Donnita Falls sent at 11/19/2022  8:43 AM EDT ----- Please let patient know: MDX urine culture from 11/15/2022 positive for Enterococcus faecalis; Augmentin prescription sent today. Advised to follow up for cystoscopy as planned. ----- Message ----- From: Ferdinand Lango, RN Sent: 11/17/2022   8:37 PM EDT To: Donnita Falls, FNP  MDX culture

## 2022-11-20 NOTE — Telephone Encounter (Signed)
Returned call to granddaughter [name] Ambum) and gave information regarding MDX culture per NP. Granddaughter voiced understanding and will make sure patient takes medication.

## 2022-11-27 DIAGNOSIS — G4730 Sleep apnea, unspecified: Secondary | ICD-10-CM | POA: Diagnosis not present

## 2022-11-28 ENCOUNTER — Encounter: Payer: Self-pay | Admitting: Urology

## 2022-11-29 ENCOUNTER — Inpatient Hospital Stay (HOSPITAL_COMMUNITY): Admission: RE | Admit: 2022-11-29 | Payer: Medicare Other | Source: Ambulatory Visit

## 2022-11-30 ENCOUNTER — Ambulatory Visit: Payer: Medicare Other | Admitting: Nurse Practitioner

## 2022-11-30 NOTE — Progress Notes (Deleted)
Office Visit    Patient Name: [name] Date of Encounter: 09/17/2022  PCP:  Anabel Halon, MD    [facility]  Cardiologist:  Nona Dell, MD  Advanced Practice Provider:  No care team member to display Electrophysiologist:  None   Chief Complaint    [name] is a 77 y.o. female with a hx of atypical A-flutter, CAD, s/p CABG in 1995, history of past stenting, hypertension, mixed hyperlipidemia, OSA on CPAP, schizophrenia, type 2 diabetes, history of TIA, postop A-fib, and history of mesenteric ischemia, presents today for 58-month follow-up.   Past Medical History    Past Medical History:  Diagnosis Date   Acute ischemic colitis (HCC) 09/11/2005   Anxiety    Arthritis    Atherosclerotic vascular disease    Calcified plaque at the origin of the celiac and SMA   CHF (congestive heart failure) (HCC)    Coronary atherosclerosis of native coronary artery    Mulitvessel LVEF 50-50%, DES circ 1/11, occluded SVG to OM and SVG to diagonal , LVEF 60%   COVID-19 virus infection 11/07/2020   Depression    Diverticulosis of colon    Essential hypertension    Hypothyroidism    Mesenteric ischemia (HCC)    Mitral regurgitation    Mild to moderate   Mixed hyperlipidemia    Myocardial infarction (HCC) 1990   Obesity    Orthostatic hypotension    PAD (peripheral artery disease) (HCC)    PVC's (premature ventricular contractions)    Schizophrenia (HCC)    Sleep apnea    CPAP   TIA (transient ischemic attack) 11/20/2017   Tubular adenoma    Type 2 diabetes mellitus (HCC)    Vascular complications of mesenteric artery    Past Surgical History:  Procedure Laterality Date   BACK SURGERY     Lumbar spine surgery   Carotid endarectomy Bilateral    CARPAL TUNNEL RELEASE     Bilateral   CATARACT EXTRACTION W/PHACO Right 08/24/2013   Procedure: CATARACT EXTRACTION PHACO AND INTRAOCULAR LENS PLACEMENT (IOC);  Surgeon: Gemma Payor, MD;   Location: [facility];  Service: Ophthalmology;  Laterality: Right;  CDE 8.68   COLONOSCOPY  09/14/2005   Dr. Jacqlyn Larsen colitis   COLONOSCOPY WITH ESOPHAGOGASTRODUODENOSCOPY (EGD)  04/14/2012   Dr. Jena Gauss- EGD= gastric erosions of doubtful clinical significance per bx- chronic inactive gastritis, benign small bowel mucosa. TCS=colonic diverticulosis, tubular adenoma   CORONARY ARTERY BYPASS GRAFT     LIMA-LAD; SVG-OM; SVG-DX in 1995 [facility]   NECK SURGERY     Cervical laminectomy   PARTIAL HYSTERECTOMY     RIGHT/LEFT HEART CATH AND CORONARY/GRAFT ANGIOGRAPHY N/A 04/12/2021   Procedure: RIGHT/LEFT HEART CATH AND CORONARY/GRAFT ANGIOGRAPHY;  Surgeon: Iran Ouch, MD;  Location: [facility];  Service: Cardiovascular;  Laterality: N/A;   stents x4      Allergies  No Known Allergies  History of Present Illness    [name] is a 77 y.o. female with a PMH as mentioned above.  Previous cardiovascular history includes CABG in 1995 (LIMA-LAD, SVG-diagonal, SVG-OM, and SVG-PDA).  Underwent cardiac catheterization in 2011, received drug-eluting stent to circumflex.  Cardiac catheterization in 2022 revealed occluded grafts, patency of LIMA-LAD.  Was noted to have moderate in-stent restenosis in circumflex/OM distribution, was recommended to be medically managed.  Myoview in 2022 revealed normal EF.  Was evaluated at [facility] in February 2024 for near syncope.  She had this experience  after taking nitroglycerin.  Due to her mild orthostasis, it was felt that she was somewhat dehydrated and was given IV fluids.  CT of the head was negative for anything acute.  Antihypertensive regimen was decreased.  Last seen by Dr. Diona Browner on July 16, 2022.  She noted feeling better, her dizziness had improved.  She did note feeling weak with low stamina, this had been a chronic issue.  EKG in office revealed A-fib with nonspecific ST changes.  CHA2DS2-VASc score was found to be 7, discussion  of anticoagulation was discussed.  Plavix was stopped and replaced with Eliquis 5 mg twice daily.  Today she presents for 53-month follow-up.  She admits to labile heart rate and blood pressures.  States recent heart rates have been in the 90s when checking her pulse oximeter.  She denies any tachycardia.  SBP averaging around 140s.  Denies any hypotension.  Admits to NYHA class III symptoms.  Seems like her breathing seems to be a little worse.  Does admit to occasional tobacco use.  Previously had quit for around 2 years.  Has quite a bit of anxiety related to her condition.  Does admit to chest fullness/tightness that will cause her to use her nebulizer, which helps, denies any active chest pain. Denies any syncope, presyncope, dizziness, orthopnea, PND, acute bleeding, or claudication.  Weight is up 7 pounds from 1 month ago.  EKGs/Labs/Other Studies Reviewed:   The following studies were reviewed today:   EKG:  EKG is not ordered today.   Right and left heart cath 03/2021:   Ost LM to Mid LM lesion is 40% stenosed.   1st Mrg lesion is 50% stenosed.   Prox Cx lesion is 50% stenosed.   Ost LAD to Prox LAD lesion is 90% stenosed.   Prox Cx to Mid Cx lesion is 99% stenosed.   Prox RCA to Mid RCA lesion is 100% stenosed.   Prox RCA lesion is 80% stenosed.   Origin lesion is 100% stenosed.   Origin to Prox Graft lesion is 100% stenosed.   LIMA graft was visualized by angiography and is normal in caliber.   1.  Severe underlying three-vessel coronary artery disease with patent LIMA to LAD.  Chronically occluded SVG to OM and SVG to diagonal.  The SVG to right PDA which was patent on most recent angiography in 2011 is now occluded.  Left circumflex stent from the proximal portion extending into OM1 is patent with moderate in-stent restenosis and subtotal occlusion of the AV groove left circumflex which was present before. 2.  Right heart catheterization showed high normal filling pressures, no  pulmonary hypertension and normal cardiac output.   Recommendations: The SVG to right PDA is now occluded which is new but the RCA has good left-to-right collaterals.  The AV groove left circumflex was subtotally occluded before and jailed by previous stent.  This could not be crossed with a wire before. Recommend continuing medical therapy.  Cardiac monitor 12/2020:  ZIO XT reviewed. 6 days, 8 hours analyzed. Predominant rhythm is sinus with prolonged PR interval and heart rate ranging from 39 bpm (early morning) up to 116 bpm and average heart rate 59 bpm. Rare PACs including couplets and triplets were noted representing less than 1% total beats. Rare PVCs including couplets and triplets were noted representing less than 1% total beats. There were also limited episodes of ventricular bigeminy and trigeminy. Multiple episodes of SVT were noted, these were generally brief with the longest lasting only  13 beats, nonsustained. There were no ventricular arrhythmias. No significant pauses.  Myoview 12/2020:    The study is low risk.   No ST deviation was noted. The ECG was negative for ischemia.   LV perfusion is abnormal. Defect 1: There is a small defect with mild reduction in uptake present in the apical anterior location(s) that is fixed. There is normal wall motion in the defect area. Consistent with artifact caused by breast attenuation. Defect 2: There is a small defect with mild reduction in uptake present in the mid to basal inferior location(s) that is partially reversible. There is normal wall motion in the defect area. Consistent with ischemia.   Left ventricular function is normal. Nuclear stress EF: 61 %.   Breast attenuation artifact was present.   Low risk study with evidence of breast attenuation artifact and also mild mid to basal inferior ischemia, LVEF normal at 61%.  Carotid duplex 12/2020:  Summary:  Right Carotid: Velocities in the right ICA are consistent with a 1-39%  stenosis.  The ECA appears >50% stenosed.   Left Carotid: Velocities in the left ICA are consistent with a 1-39%  stenosis.   Vertebrals: Left vertebral artery demonstrates antegrade flow. Right  atypical antegrade.  Subclavians: Right subclavian artery flow was disturbed. Normal flow hemodynamics were seen in the left subclavian artery.  Echo 05/2020:  IMPRESSIONS    1. Abnormal septal motion distal septal apical mid and basal inferior  wall hypokinesis . Left ventricular ejection fraction, by estimation, is  45 to 50%. The left ventricle has mildly decreased function. The left  ventricle has no regional wall motion  abnormalities. The left ventricular internal cavity size was mildly  dilated. There is moderate left ventricular hypertrophy. Left ventricular  diastolic parameters are consistent with Grade I diastolic dysfunction  (impaired relaxation).   2. Right ventricular systolic function is normal. The right ventricular  size is normal.   3. Left atrial size was mildly dilated.   4. The mitral valve is degenerative. Mild mitral valve regurgitation. No  evidence of mitral stenosis.   5. The aortic valve is tricuspid. Aortic valve regurgitation is not  visualized. Mild aortic valve sclerosis is present, with no evidence of  aortic valve stenosis.   6. The inferior vena cava is normal in size with greater than 50%  respiratory variability, suggesting right atrial pressure of 3 mmHg.   Recent Labs: 11/15/2022: ALT 19; BUN 26; Creatinine, Ser 1.10; Hemoglobin 13.9; Platelets 165; Potassium 4.4; Sodium 138; TSH 2.140  Recent Lipid Panel    Component Value Date/Time   CHOL 131 11/15/2022 1452   TRIG 91 11/15/2022 1452   HDL 55 11/15/2022 1452   CHOLHDL 2.4 11/15/2022 1452   LDLCALC 59 11/15/2022 1452    Risk Assessment/Calculations:  CHA2DS2-VASc score is 7.   The 10-year ASCVD risk score (Arnett DK, et al., 2019) is: 43.3%   Values used to calculate the score:     Age: 34 years      Sex: Female     Is Non-Hispanic African American: No     Diabetic: Yes     Tobacco smoker: Yes     Systolic Blood Pressure: 102 mmHg     Is BP treated: Yes     HDL Cholesterol: 55 mg/dL     Total Cholesterol: 131 mg/dL  Home Medications   Current Meds  Medication Sig   albuterol (VENTOLIN HFA) 108 (90 Base) MCG/ACT inhaler Inhale 1 puff into the lungs every  6 (six) hours as needed for wheezing or shortness of breath.   ALPRAZolam (XANAX) 0.5 MG tablet TAKE 1 TABLET BY MOUTH TWICE DAILY AS NEEDED FOR ANXIETY   amLODipine (NORVASC) 10 MG tablet Take 10 mg by mouth daily.   apixaban (ELIQUIS) 5 MG TABS tablet Take 1 tablet (5 mg total) by mouth 2 (two) times daily.   aspirin EC 81 MG tablet Take 81 mg by mouth at bedtime.   Biotin 16109 MCG TABS Take 1 tablet by mouth daily.   blood glucose meter kit and supplies Dispense based on patient and insurance preference. Use up to four times daily as directed. (FOR ICD-10 E10.9, E11.9).   calcitRIOL (ROCALTROL) 0.25 MCG capsule Take 1 capsule by mouth 2 (two) times a week.   Cholecalciferol (VITAMIN D3) 50 MCG (2000 UT) TABS Take 1 tablet by mouth daily.   citalopram (CELEXA) 20 MG tablet TAKE ONE (1) TABLET BY MOUTH EVERY DAY   Collagen-Vitamin C (COLLAGEN PLUS VITAMIN C PO) Take 1 tablet by mouth daily.   dapagliflozin propanediol (FARXIGA) 5 MG TABS tablet Take 5 mg by mouth daily.   diphenoxylate-atropine (LOMOTIL) 2.5-0.025 MG tablet Take 1 tablet by mouth 2 (two) times daily as needed for diarrhea or loose stools.   famotidine (PEPCID) 40 MG tablet Take 1 tablet (40 mg total) by mouth daily as needed for heartburn or indigestion.   fluticasone (FLOVENT HFA) 110 MCG/ACT inhaler Inhale 2 puffs into the lungs 2 (two) times daily.   ipratropium (ATROVENT) 0.03 % nasal spray Place 2 sprays into both nostrils every 12 (twelve) hours.   ipratropium-albuterol (DUONEB) 0.5-2.5 (3) MG/3ML SOLN Take 3 mLs by nebulization 4 times daily and as needed for  shortness of breath or wheezing.   isosorbide mononitrate (IMDUR) 60 MG 24 hr tablet TAKE ONE (1) TABLET EACH DAY   levothyroxine (SYNTHROID) 50 MCG tablet Take 1 tablet (50 mcg total) by mouth daily before breakfast.   Melatonin 10 MG TABS Take 10 mg by mouth at bedtime as needed (sleep).   metoprolol tartrate (LOPRESSOR) 50 MG tablet TAKE ONE (1) TABLET BY MOUTH TWO (2) TIMES DAILY   ondansetron (ZOFRAN ODT) 4 MG disintegrating tablet Take 1 tablet (4 mg total) by mouth every 8 (eight) hours as needed for nausea or vomiting.   OneTouch Delica Lancets 33G MISC 4 TIMES DAILY   ONETOUCH VERIO test strip 4 TIMES DAILY   pantoprazole (PROTONIX) 40 MG tablet TAKE ONE TABLET BY MOUTH EVERY MORNING   pravastatin (PRAVACHOL) 80 MG tablet Take 1 tablet (80 mg total) by mouth daily.   promethazine-dextromethorphan (PROMETHAZINE-DM) 6.25-15 MG/5ML syrup Take 5 mLs by mouth 4 (four) times daily as needed for cough.   Tiotropium Bromide-Olodaterol (STIOLTO RESPIMAT) 2.5-2.5 MCG/ACT AERS Inhale 2 puffs into the lungs daily.   torsemide (DEMADEX) 20 MG tablet Take 1 tablet (20 mg total) by mouth daily.   triamcinolone cream (KENALOG) 0.1 % Apply 1 Application topically 2 (two) times daily.   valsartan (DIOVAN) 40 MG tablet Take 80 mg by mouth daily.   zolpidem (AMBIEN) 10 MG tablet TAKE 1 TABLET BY MOUTH AT BEDTIME AS NEEDED FOR SLEEP   nitroGLYCERIN (NITROSTAT) 0.4 MG SL tablet Place 0.4 mg under the tongue every 5 (five) minutes x 3 doses as needed for chest pain.    Review of Systems    All other systems reviewed and are otherwise negative except as noted above.  Physical Exam    VS:  There were no  vitals taken for this visit. , BMI There is no height or weight on file to calculate BMI.  Wt Readings from Last 3 Encounters:  11/15/22 212 lb 12.8 oz (96.5 kg)  11/14/22 210 lb (95.3 kg)  09/17/22 213 lb (96.6 kg)     GEN: Obese, 77 y.o. female in no acute distress. HEENT: normal. Neck: Supple,  no JVD, carotid bruits, or masses. Cardiac: S1/S2, RRR, no murmurs, rubs, or gallops. No clubbing, cyanosis, edema.  Radials/PT 2+ and equal bilaterally.  Respiratory:  Respirations regular and unlabored, clear to auscultation bilaterally. MS: No deformity or atrophy. Skin: Warm and dry, no rash. Neuro:  Strength and sensation are intact. Psych: Normal affect.  Assessment & Plan    Multivessel CAD, s/p CABG in 1995, DOE Admits to occasional chest tightness/fullness that is relieved with nebulizer use, does not sound cardiac related. Etiology multifactorial, and anxiety does seem to be contributing to this as well. Denies any active chest pain, therefore no indication for ischemic evaluation at this time. NST in 2022 was low risk. Will refill NTG at this time. Due to NYHA class III symptoms/ DOE, will update Echo at this time. Continue Aspirin, Imdur, Lopressor, pravastatin, and valsartan. Heart healthy diet and regular cardiovascular exercise encouraged. ED precautions discussed. If no improvement by next OV, consider repeat NST.   HLD, hx of mesenteric ischemia  She is status post right hemicolectomy and right iliac to SMA bypass performed at [facility] in 2021. Denies any issues. Has upcoming labs with PCP arranged. Continue current medication regimen. Heart healthy diet and regular cardiovascular exercise encouraged.   HTN BP stable today. Discussed to monitor BP at home at least 2 hours after medications and sitting for 5-10 minutes. Continue current medication regimen. Heart healthy diet and regular cardiovascular exercise encouraged.   Atypical A-flutter, post-op A-fib Does have previous hx of post-op A-fib and noted to have atypical A-flutter on ECG at [facility] 05/2022. Follow-up ECG at last OV with Dr. Diona Browner revealed rate controlled A-fib, was started on [facility]. Continue Eliquis 5 mg BID, on appropriate dosage and denies any bleeding issues. Recent labs stable.  5. Tobacco use Smoking  cessation encouraged and discussed.     Disposition: Follow up in 2-3 month(s) with Nona Dell, MD or APP.  Signed, Sharlene Dory, NP 11/30/2022, 8:22 AM  [facility]

## 2022-12-10 ENCOUNTER — Other Ambulatory Visit: Payer: Self-pay | Admitting: Internal Medicine

## 2022-12-10 ENCOUNTER — Encounter: Payer: Self-pay | Admitting: Nurse Practitioner

## 2022-12-10 ENCOUNTER — Ambulatory Visit: Payer: Medicare Other | Attending: Nurse Practitioner | Admitting: Nurse Practitioner

## 2022-12-10 VITALS — BP 137/83 | HR 71 | Ht 63.0 in | Wt 222.6 lb

## 2022-12-10 DIAGNOSIS — F331 Major depressive disorder, recurrent, moderate: Secondary | ICD-10-CM

## 2022-12-10 DIAGNOSIS — E785 Hyperlipidemia, unspecified: Secondary | ICD-10-CM

## 2022-12-10 DIAGNOSIS — I5022 Chronic systolic (congestive) heart failure: Secondary | ICD-10-CM

## 2022-12-10 DIAGNOSIS — I484 Atypical atrial flutter: Secondary | ICD-10-CM

## 2022-12-10 DIAGNOSIS — I4891 Unspecified atrial fibrillation: Secondary | ICD-10-CM | POA: Diagnosis not present

## 2022-12-10 DIAGNOSIS — I9789 Other postprocedural complications and disorders of the circulatory system, not elsewhere classified: Secondary | ICD-10-CM | POA: Diagnosis not present

## 2022-12-10 DIAGNOSIS — I38 Endocarditis, valve unspecified: Secondary | ICD-10-CM | POA: Diagnosis not present

## 2022-12-10 DIAGNOSIS — K559 Vascular disorder of intestine, unspecified: Secondary | ICD-10-CM | POA: Diagnosis not present

## 2022-12-10 DIAGNOSIS — Z720 Tobacco use: Secondary | ICD-10-CM

## 2022-12-10 DIAGNOSIS — N183 Chronic kidney disease, stage 3 unspecified: Secondary | ICD-10-CM

## 2022-12-10 DIAGNOSIS — J449 Chronic obstructive pulmonary disease, unspecified: Secondary | ICD-10-CM | POA: Diagnosis not present

## 2022-12-10 DIAGNOSIS — I77819 Aortic ectasia, unspecified site: Secondary | ICD-10-CM | POA: Diagnosis not present

## 2022-12-10 DIAGNOSIS — I10 Essential (primary) hypertension: Secondary | ICD-10-CM | POA: Diagnosis not present

## 2022-12-10 DIAGNOSIS — I2510 Atherosclerotic heart disease of native coronary artery without angina pectoris: Secondary | ICD-10-CM

## 2022-12-10 MED ORDER — DAPAGLIFLOZIN PROPANEDIOL 5 MG PO TABS: 5.0000 mg | ORAL_TABLET | Freq: Every day | ORAL | 6 refills | Status: DC

## 2022-12-10 MED ORDER — FUROSEMIDE 40 MG PO TABS: 40.0000 mg | ORAL_TABLET | Freq: Every day | ORAL | 6 refills | Status: DC

## 2022-12-10 NOTE — Patient Instructions (Addendum)
Medication Instructions:   Stop Plavix (Clopidogrel) Begin Farxiga 5mg  daily  Stop Torsemide (Demadex) Begin Lasix 40mg  daily  Continue Aspirin at 81mg  daily  Continue all other medications.     Labwork:  none  Testing/Procedures:  none  Follow-Up:  6 weeks   Any Other Special Instructions Will Be Listed Below (If Applicable).   If you need a refill on your cardiac medications before your next appointment, please call your pharmacy.

## 2022-12-10 NOTE — Progress Notes (Unsigned)
Office Visit    Patient Name: [name] Date of Encounter: 12/10/2022 PCP:  Anabel Halon, MD [facility]  Cardiologist:  Nona Dell, MD  Advanced Practice Provider:  No care team member to display Electrophysiologist:  None   Chief Complaint and HPI    [name] is a 77 y.o. female with a hx of atypical A-flutter, CAD, s/p CABG in 1995, history of past stenting, hypertension, mixed hyperlipidemia, HFmrEF, OSA on CPAP, schizophrenia, type 2 diabetes, history of TIA, postop A-fib, and history of mesenteric ischemia, presents today for follow-up.   Previous CV history includes CABG in 1995 (LIMA-LAD, SVG-diagonal, SVG-OM, and SVG-PDA).  Underwent cardiac catheterization in 2011, received DES to circumflex.  Cardiac catheterization in 2022 revealed occluded grafts, patency of LIMA-LAD.  Was noted to have moderate in-stent restenosis in circumflex/OM distribution, was recommended to be medically managed.  Myoview in 2022 revealed normal EF.  Was evaluated at [facility] in February 2024 for near syncope.  She had this experience after taking NTG.  Due to her mild orthostasis, it was felt that she was somewhat dehydrated and was given IV fluids.  CT of the head negative.  Antihypertensive regimen was decreased.  Last seen by Dr. Diona Browner on July 16, 2022.  She noted feeling better, dizziness improved.  She did note feeling weak with low stamina, this had been a chronic issue.  EKG in office revealed A-fib with nonspecific ST changes.  CHA2DS2-VASc score was found to be 7, discussion of anticoagulation was discussed.  Plavix was stopped and replaced with Eliquis 5 mg twice daily.  I last saw her for 77-month follow-up on Sep 17, 2022.  Patient admitted to labile heart rate and blood pressures.  Denied any hypotension.  Admits to NYHA class III symptoms.  Said it seemed like her breathing seemed to be a little worse.  Admitted to occasional tobacco  use. Noted quite a bit of anxiety related to her condition. She did note chest fullness/tightness that would cause her to use her nebulizer, which helped, denied any active chest pain.  Weight was up 7 pounds from 1 month ago. Echo updated and results noted below.  Today she presents for follow-up with her granddaughter. Pt is a poor historian, and there appears to be some compliance issues with her medications. She is still taking Plavix in addition to Aspirin and Eliquis. Did not start [place] as she did not want this to cause a yeast infection. Weight is up from last OV. Her dizziness has improved. Denies any chest pain, does admit to NYHA class II-III symptoms. Requesting to change fluid pill, says she is not able to tolerate Torsemide as this is causing increased frequency/urgency, follows Dr. Wolfgang Phoenix and has upcoming labs arranged. Denies any recent palpitations, syncope, presyncope, dizziness, orthopnea, PND, swelling, acute bleeding, or claudication.  EKGs/Labs/Other Studies Reviewed:   The following studies were reviewed today:   EKG:  EKG is not ordered today.   Echo 10/2022:  1. Left ventricular ejection fraction, by estimation, is 45 to 50%. The  left ventricle has mildly decreased function. The left ventricle  demonstrates regional wall motion abnormalities (see scoring  diagram/findings for description). There is mild left  ventricular hypertrophy. Left ventricular diastolic parameters are  indeterminate.   2. Right ventricular systolic function is low normal. The right  ventricular size is normal. There is normal pulmonary artery systolic  pressure. The estimated right ventricular systolic pressure is 31.8 mmHg.  3. The mitral valve is degenerative. Mild to moderate mitral valve  regurgitation.   4. Tricuspid valve regurgitation is moderate.   5. The aortic valve is tricuspid. Aortic valve regurgitation is not  visualized. Aortic valve sclerosis is present, with no evidence of  aortic  valve stenosis.   6. Aortic dilatation noted. There is mild dilatation of the ascending  aorta, measuring 41 mm.   7. The inferior vena cava is dilated in size with >50% respiratory  variability, suggesting right atrial pressure of 8 mmHg.   Comparison(s): Prior images reviewed side by side. LVEF stable in 45-50%  range. Normal estimated RVSP. Mild to moderate mitral and moderate  tricuspid regurgitation.  Right and left heart cath 03/2021:   Ost LM to Mid LM lesion is 40% stenosed.   1st Mrg lesion is 50% stenosed.   Prox Cx lesion is 50% stenosed.   Ost LAD to Prox LAD lesion is 90% stenosed.   Prox Cx to Mid Cx lesion is 99% stenosed.   Prox RCA to Mid RCA lesion is 100% stenosed.   Prox RCA lesion is 80% stenosed.   Origin lesion is 100% stenosed.   Origin to Prox Graft lesion is 100% stenosed.   LIMA graft was visualized by angiography and is normal in caliber.   1.  Severe underlying three-vessel coronary artery disease with patent LIMA to LAD.  Chronically occluded SVG to OM and SVG to diagonal.  The SVG to right PDA which was patent on most recent angiography in 2011 is now occluded.  Left circumflex stent from the proximal portion extending into OM1 is patent with moderate in-stent restenosis and subtotal occlusion of the AV groove left circumflex which was present before. 2.  Right heart catheterization showed high normal filling pressures, no pulmonary hypertension and normal cardiac output.   Recommendations: The SVG to right PDA is now occluded which is new but the RCA has good left-to-right collaterals.  The AV groove left circumflex was subtotally occluded before and jailed by previous stent.  This could not be crossed with a wire before. Recommend continuing medical therapy.  Cardiac monitor 12/2020:  ZIO XT reviewed. 6 days, 8 hours analyzed. Predominant rhythm is sinus with prolonged PR interval and heart rate ranging from 39 bpm (early morning) up to 116 bpm  and average heart rate 59 bpm. Rare PACs including couplets and triplets were noted representing less than 1% total beats. Rare PVCs including couplets and triplets were noted representing less than 1% total beats. There were also limited episodes of ventricular bigeminy and trigeminy. Multiple episodes of SVT were noted, these were generally brief with the longest lasting only 13 beats, nonsustained. There were no ventricular arrhythmias. No significant pauses.  Myoview 12/2020:    The study is low risk.   No ST deviation was noted. The ECG was negative for ischemia.   LV perfusion is abnormal. Defect 1: There is a small defect with mild reduction in uptake present in the apical anterior location(s) that is fixed. There is normal wall motion in the defect area. Consistent with artifact caused by breast attenuation. Defect 2: There is a small defect with mild reduction in uptake present in the mid to basal inferior location(s) that is partially reversible. There is normal wall motion in the defect area. Consistent with ischemia.   Left ventricular function is normal. Nuclear stress EF: 61 %.   Breast attenuation artifact was present.   Low risk study with evidence of breast attenuation  artifact and also mild mid to basal inferior ischemia, LVEF normal at 61%.  Carotid duplex 12/2020:  Summary:  Right Carotid: Velocities in the right ICA are consistent with a 1-39%  stenosis. The ECA appears >50% stenosed.   Left Carotid: Velocities in the left ICA are consistent with a 1-39%  stenosis.   Vertebrals: Left vertebral artery demonstrates antegrade flow. Right  atypical antegrade.  Subclavians: Right subclavian artery flow was disturbed. Normal flow hemodynamics were seen in the left subclavian artery.  Echo 05/2020:  IMPRESSIONS    1. Abnormal septal motion distal septal apical mid and basal inferior  wall hypokinesis . Left ventricular ejection fraction, by estimation, is  45 to 50%. The left  ventricle has mildly decreased function. The left  ventricle has no regional wall motion  abnormalities. The left ventricular internal cavity size was mildly  dilated. There is moderate left ventricular hypertrophy. Left ventricular  diastolic parameters are consistent with Grade I diastolic dysfunction  (impaired relaxation).   2. Right ventricular systolic function is normal. The right ventricular  size is normal.   3. Left atrial size was mildly dilated.   4. The mitral valve is degenerative. Mild mitral valve regurgitation. No  evidence of mitral stenosis.   5. The aortic valve is tricuspid. Aortic valve regurgitation is not  visualized. Mild aortic valve sclerosis is present, with no evidence of  aortic valve stenosis.   6. The inferior vena cava is normal in size with greater than 50%  respiratory variability, suggesting right atrial pressure of 3 mmHg.   Risk Assessment/Calculations:  CHA2DS2-VASc score is 7.   The 10-year ASCVD risk score (Arnett DK, et al., 2019) is: 64.2%   Values used to calculate the score:     Age: 70 years     Sex: Female     Is Non-Hispanic African American: No     Diabetic: Yes     Tobacco smoker: Yes     Systolic Blood Pressure: 137 mmHg     Is BP treated: Yes     HDL Cholesterol: 55 mg/dL     Total Cholesterol: 131 mg/dL  Review of Systems    All other systems reviewed and are otherwise negative except as noted above.  Physical Exam    VS:  BP 137/83 (BP Location: Right Arm, Patient Position: Sitting, Cuff Size: Large)   Pulse 71   Ht 5\' 3"  (1.6 m)   Wt 222 lb 9.6 oz (101 kg)   SpO2 97%   BMI 39.43 kg/m  , BMI Body mass index is 39.43 kg/m.  Wt Readings from Last 3 Encounters:  12/10/22 222 lb 9.6 oz (101 kg)  11/15/22 212 lb 12.8 oz (96.5 kg)  11/14/22 210 lb (95.3 kg)     GEN: Obese, 77 y.o. female in no acute distress. HEENT: normal. Neck: Supple, no JVD, carotid bruits, or masses. Cardiac: S1/S2, RRR, no murmurs, rubs, or  gallops. No clubbing, cyanosis, edema.  Radials/PT 2+ and equal bilaterally.  Respiratory:  Respirations regular and unlabored, clear to auscultation bilaterally. MS: No deformity or atrophy. Skin: Warm and dry, no rash. Neuro:  Strength and sensation are intact. Psych: Normal affect.  Assessment & Plan    HFmrEF Stage C, NYHA class II-III symptoms. EF 10/2022 45-50%. Weight is up 12 lbs since March 2024 visit with Dr. Diona Browner. No signs of JVD or signs of volume overload on exam, difficult to ascertain fluid status d/t patient's BMI, not in acute distress. She is  requesting to switch to Lasix instead of Torsemide d/t increased frequency/urgency with voiding that is causing her discomfort. Will stop Torsemide and begin Lasix 40 mg daily. Pt defers lab work to Dr. Wolfgang Phoenix, will review labs at next OV. Discussed to begin Farxiga 5 mg daily as previously prescribed. If no improvement by next OV, will consider increasing Lasix/switch Valsartan to Entresto. Continue valsartan, Lopressor and Imdur. Low sodium diet, fluid restriction <2L, and daily weights encouraged. Educated to contact our office for weight gain of 2 lbs overnight or 5 lbs in one week. ED precautions discussed.  Multivessel CAD, s/p CABG in 1995 Stable with no anginal symptoms. No indication for ischemic evaluation.  NST in 2022 was low risk. Continue Aspirin, Imdur, Lopressor, pravastatin, and valsartan. Discussed to stop Plavix and continue Aspirin 81 mg daily and Eliquis. Heart healthy diet and regular cardiovascular exercise encouraged. ED precautions discussed.   HLD, hx of mesenteric ischemia  She is status post right hemicolectomy and right iliac to SMA bypass performed at [facility] in 2021. Denies any issues. Recent LDL 59. Continue current medication regimen. Heart healthy diet and regular cardiovascular exercise encouraged.   HTN BP elevated on arrival, repeat BP stable. Admits to SBP averaging 140's at home. Discussed to monitor  BP at home at least 2 hours after medications and sitting for 5-10 minutes. Continue current medication regimen. Heart healthy diet and regular cardiovascular exercise encouraged. Will consider switching Valsartan to Entresto at next OV if BP is not at goal by next OV.  Atypical A-flutter, post-op A-fib Does have previous hx of post-op A-fib and noted to have atypical A-flutter on ECG at [facility] 05/2022. Denies any tachycardia or palpitations. HR well controlled today. Continue Eliquis 5 mg BID, on appropriate dosage and denies any bleeding issues. Continue Lopressor.   6. Valvular insufficiency Recent TTE revealed moderate TR, along with mild to moderate MR. Recommend updating Echo in 1-2 years or sooner if clinically indicated.   7. Aortic dilatation Mild dilatation of ascending aorta on TTE, measuring 41 mm. Recommend updating TTE in 1 year for monitoring.   8. CKD stage 3 Most recent labs reveal stable kidney function. Avoid nephrotoxic agents. Will be obtaining labs with Dr. Wolfgang Phoenix soon, she politely defers labs at this time. Continue to follow with PCP and Nephrology.  5. Tobacco use Smoking cessation encouraged and discussed.    Disposition: Follow up in 6 weeks with Nona Dell, MD or APP.  Signed, Sharlene Dory, NP 12/12/2022, 3:05 PM [facility]

## 2022-12-11 DIAGNOSIS — J449 Chronic obstructive pulmonary disease, unspecified: Secondary | ICD-10-CM | POA: Diagnosis not present

## 2022-12-12 ENCOUNTER — Encounter (INDEPENDENT_AMBULATORY_CARE_PROVIDER_SITE_OTHER): Payer: Medicare Other

## 2022-12-12 ENCOUNTER — Encounter: Payer: Self-pay | Admitting: Nurse Practitioner

## 2022-12-12 DIAGNOSIS — G4733 Obstructive sleep apnea (adult) (pediatric): Secondary | ICD-10-CM

## 2022-12-12 DIAGNOSIS — R0683 Snoring: Secondary | ICD-10-CM

## 2022-12-12 DIAGNOSIS — Z8669 Personal history of other diseases of the nervous system and sense organs: Secondary | ICD-10-CM

## 2022-12-13 DIAGNOSIS — Z79899 Other long term (current) drug therapy: Secondary | ICD-10-CM | POA: Diagnosis not present

## 2022-12-13 DIAGNOSIS — I484 Atypical atrial flutter: Secondary | ICD-10-CM | POA: Diagnosis not present

## 2022-12-14 LAB — CBC: RBC: 4.76 x10E6/uL (ref 3.77–5.28)

## 2022-12-21 DIAGNOSIS — N1831 Chronic kidney disease, stage 3a: Secondary | ICD-10-CM | POA: Diagnosis not present

## 2022-12-21 DIAGNOSIS — I5022 Chronic systolic (congestive) heart failure: Secondary | ICD-10-CM | POA: Diagnosis not present

## 2022-12-21 DIAGNOSIS — G4733 Obstructive sleep apnea (adult) (pediatric): Secondary | ICD-10-CM | POA: Diagnosis not present

## 2022-12-21 DIAGNOSIS — E1122 Type 2 diabetes mellitus with diabetic chronic kidney disease: Secondary | ICD-10-CM | POA: Diagnosis not present

## 2023-01-07 ENCOUNTER — Other Ambulatory Visit: Payer: Self-pay | Admitting: Cardiology

## 2023-01-07 ENCOUNTER — Other Ambulatory Visit: Payer: Self-pay | Admitting: Internal Medicine

## 2023-01-07 DIAGNOSIS — E039 Hypothyroidism, unspecified: Secondary | ICD-10-CM

## 2023-01-07 DIAGNOSIS — Z720 Tobacco use: Secondary | ICD-10-CM

## 2023-01-07 NOTE — Telephone Encounter (Signed)
Prescription refill request for Eliquis received. Indication: AF Last office visit: 12/10/22  Shawnie Dapper NP Scr: 1.61 on 12/13/22  Epic Age: 77 Weight: 101kg  Based on above findings Eliquis 5mg  twice daily is the appropriate dose.  Refill approved.

## 2023-01-09 ENCOUNTER — Ambulatory Visit: Payer: Medicare Other | Admitting: Nurse Practitioner

## 2023-01-09 ENCOUNTER — Telehealth: Payer: Self-pay | Admitting: *Deleted

## 2023-01-09 NOTE — Progress Notes (Signed)
  Care Coordination  Outreach Note  01/09/2023 Name: [name] MRN: [id number] DOB: [birth year]   Care Coordination Outreach Attempts: An unsuccessful telephone outreach was attempted today to offer the patient information about available care coordination services.  Follow Up Plan:  Additional outreach attempts will be made to offer the patient care coordination information and services.   Encounter Outcome:  No Answer   Gwenevere Ghazi  Care Coordination Care Guide  Direct Dial: [phone]

## 2023-01-10 DIAGNOSIS — J449 Chronic obstructive pulmonary disease, unspecified: Secondary | ICD-10-CM | POA: Diagnosis not present

## 2023-01-14 ENCOUNTER — Telehealth: Payer: Self-pay | Admitting: Internal Medicine

## 2023-01-14 ENCOUNTER — Other Ambulatory Visit: Payer: Self-pay | Admitting: Internal Medicine

## 2023-01-14 DIAGNOSIS — I10 Essential (primary) hypertension: Secondary | ICD-10-CM

## 2023-01-14 MED ORDER — VALSARTAN 40 MG PO TABS
40.0000 mg | ORAL_TABLET | Freq: Every day | ORAL | 1 refills | Status: DC
Start: 2023-01-14 — End: 2023-03-07

## 2023-01-14 NOTE — Telephone Encounter (Signed)
Emma w. Clinical lab called in on patient behalf   Wants to know status on provider signature on pt UTI requisition   Call back [phone]

## 2023-01-14 NOTE — Telephone Encounter (Signed)
Prescription Request  01/14/2023  LOV: 11/15/2022  What is the name of the medication or equipment? valsartan (DIOVAN) 80 MG table   Have you contacted your pharmacy to request a refill? Yes   Which pharmacy would you like this sent to?  [facility]   Patient notified that their request is being sent to the clinical staff for review and that they should receive a response within 2 business days.   Please advise at Mobile [phone] (mobile)   Patient granddaughter asked for a call back at [phone] when this medicine is sent into her pharmacy

## 2023-01-14 NOTE — Telephone Encounter (Signed)
[name] advised that patient will not be doing the uti lab order

## 2023-01-15 NOTE — Progress Notes (Unsigned)
Care Coordination  Outreach Note  01/15/2023 Name: [name] MRN: [id number] DOB: [birth year]   Care Coordination Outreach Attempts: A second unsuccessful outreach was attempted today to offer the patient with information about available care coordination services.  Follow Up Plan:  Additional outreach attempts will be made to offer the patient care coordination information and services.   Encounter Outcome:  No Answer  Gwenevere Ghazi  Care Coordination Care Guide  Direct Dial: [phone]

## 2023-01-15 NOTE — Telephone Encounter (Signed)
Granddaughter advised.

## 2023-01-16 ENCOUNTER — Other Ambulatory Visit: Payer: Medicare Other | Admitting: Urology

## 2023-01-16 DIAGNOSIS — R3989 Other symptoms and signs involving the genitourinary system: Secondary | ICD-10-CM

## 2023-01-17 NOTE — Progress Notes (Signed)
Care Coordination   Note   01/17/2023 Name: [name] MRN: [id number] DOB: 05-Jan-1946  [name] is a 77 y.o. year old female who sees Anabel Halon, MD for primary care. I reached out to Yvonne Kendall by phone today to offer care coordination services.  Ms. Devalk was given information about Care Coordination services today including:   The Care Coordination services include support from the care team which includes your Nurse Coordinator, Clinical Social Worker, or Pharmacist.  The Care Coordination team is here to help remove barriers to the health concerns and goals most important to you. Care Coordination services are voluntary, and the patient may decline or stop services at any time by request to their care team member.   Care Coordination Consent Status: Patient granddaughter [name] on DPR agreed to services and verbal consent obtained.   Follow up plan:  Telephone appointment with care coordination team member scheduled for:  01/29/23  Encounter Outcome:  Patient Scheduled  [facility] Coordination Care Guide  Direct Dial: [phone]

## 2023-01-22 ENCOUNTER — Ambulatory Visit: Payer: Medicare Other | Admitting: Nurse Practitioner

## 2023-01-24 ENCOUNTER — Encounter: Payer: Self-pay | Admitting: Nurse Practitioner

## 2023-01-24 ENCOUNTER — Ambulatory Visit: Payer: Medicare Other | Attending: Nurse Practitioner | Admitting: Nurse Practitioner

## 2023-01-24 DIAGNOSIS — I38 Endocarditis, valve unspecified: Secondary | ICD-10-CM | POA: Diagnosis not present

## 2023-01-24 DIAGNOSIS — I5022 Chronic systolic (congestive) heart failure: Secondary | ICD-10-CM

## 2023-01-24 DIAGNOSIS — K559 Vascular disorder of intestine, unspecified: Secondary | ICD-10-CM

## 2023-01-24 DIAGNOSIS — N179 Acute kidney failure, unspecified: Secondary | ICD-10-CM | POA: Diagnosis not present

## 2023-01-24 DIAGNOSIS — I2510 Atherosclerotic heart disease of native coronary artery without angina pectoris: Secondary | ICD-10-CM

## 2023-01-24 DIAGNOSIS — I9789 Other postprocedural complications and disorders of the circulatory system, not elsewhere classified: Secondary | ICD-10-CM

## 2023-01-24 DIAGNOSIS — R001 Bradycardia, unspecified: Secondary | ICD-10-CM | POA: Diagnosis not present

## 2023-01-24 DIAGNOSIS — E785 Hyperlipidemia, unspecified: Secondary | ICD-10-CM | POA: Diagnosis not present

## 2023-01-24 DIAGNOSIS — I77819 Aortic ectasia, unspecified site: Secondary | ICD-10-CM | POA: Diagnosis not present

## 2023-01-24 DIAGNOSIS — N183 Chronic kidney disease, stage 3 unspecified: Secondary | ICD-10-CM

## 2023-01-24 DIAGNOSIS — I10 Essential (primary) hypertension: Secondary | ICD-10-CM

## 2023-01-24 DIAGNOSIS — R600 Localized edema: Secondary | ICD-10-CM | POA: Diagnosis not present

## 2023-01-24 DIAGNOSIS — Z720 Tobacco use: Secondary | ICD-10-CM

## 2023-01-24 DIAGNOSIS — I4891 Unspecified atrial fibrillation: Secondary | ICD-10-CM

## 2023-01-24 DIAGNOSIS — I484 Atypical atrial flutter: Secondary | ICD-10-CM | POA: Diagnosis not present

## 2023-01-24 DIAGNOSIS — I5020 Unspecified systolic (congestive) heart failure: Secondary | ICD-10-CM

## 2023-01-24 MED ORDER — COMPRESSION BANDAGES KIT: 1.0000 | PACK | Freq: Every day | 0 refills | Status: DC

## 2023-01-24 NOTE — Progress Notes (Signed)
Office Visit    Patient Name: [name] Date of Encounter: 01/24/2023 PCP:  Anabel Halon, MD [facility]  Cardiologist:  Nona Dell, MD  Advanced Practice Provider:  No care team member to display Electrophysiologist:  None   Chief Complaint and HPI    [name] is a 77 y.o. female with a hx of atypical A-flutter, CAD, s/p CABG in 1995, history of past stenting, hypertension, mixed hyperlipidemia, HFmrEF, OSA on CPAP, schizophrenia, type 2 diabetes, history of TIA, postop A-fib, and history of mesenteric ischemia, presents today for follow-up.   Previous CV history includes CABG in 1995 (LIMA-LAD, SVG-diagonal, SVG-OM, and SVG-PDA).  Underwent cardiac catheterization in 2011, received DES to circumflex.  Cardiac catheterization in 2022 revealed occluded grafts, patency of LIMA-LAD.  Was noted to have moderate in-stent restenosis in circumflex/OM distribution, was recommended to be medically managed.  Myoview in 2022 revealed normal EF.  Was evaluated at [facility] in February 2024 for near syncope.  She had this experience after taking NTG.  Due to her mild orthostasis, it was felt that she was somewhat dehydrated and was given IV fluids.  CT of the head negative.  Antihypertensive regimen was decreased.  Last seen by Dr. Diona Browner on July 16, 2022.  She noted feeling better, dizziness improved.  She did note feeling weak with low stamina, this had been a chronic issue.  EKG in office revealed A-fib with nonspecific ST changes.  CHA2DS2-VASc score was found to be 7, discussion of anticoagulation was discussed.  Plavix was stopped and replaced with Eliquis 5 mg twice daily.  12/10/2022 - Today she presented for follow-up with her granddaughter. Pt is a poor historian, and there appeared to be some compliance issues with her medications. She was still taking Plavix in addition to Aspirin and Eliquis. She did not start [place] as she did not want  this to cause a yeast infection. Weight was up from last OV. Her dizziness had improved. Denied any chest pain, did admit to NYHA class II-III symptoms. Was requesting to change fluid pill, said she was not able to tolerate Torsemide as this was causing increased frequency/urgency, follows Dr. Wolfgang Phoenix and noted upcoming labs arranged. Denied any recent palpitations, syncope, presyncope, dizziness, orthopnea, PND, swelling, acute bleeding, or claudication.  Medications were adjusted.  Patient politely deferred lab work to Dr. Wolfgang Phoenix.   01/24/2023 -presents for follow-up with her granddaughter who assists her to her doctors appointments.  Poor historian and has forgetfulness at times.  Says she believes her shortness of breath appears to be worse from last office visit, weight is down 4 pounds per our records from last office visit.  Says her shortness of breath causes her to take breaks, sit down.  Says she feels like she is losing her balance.  She feels as though she is tired feels like she is giving out.  Reports an episode of chest pressure located along the left side at rest, says she took NTG, but unable to recall episode. Granddaughter questions patient's report at times.  Patient also believes that her symptoms are due to stress, currently has a relative living in her house that is adding to a stressful situation. Denies any abuse, SI/HI.  Denies any palpitations, syncope, presyncope, dizziness, orthopnea, PND, swelling or significant weight changes, acute bleeding, or claudication.  Negative. See HPI.   EKGs/Labs/Other Studies Reviewed:   The following studies were reviewed today:   EKG:  EKG is not  ordered today.   Echo 10/2022:  1. Left ventricular ejection fraction, by estimation, is 45 to 50%. The  left ventricle has mildly decreased function. The left ventricle  demonstrates regional wall motion abnormalities (see scoring  diagram/findings for description). There is mild left   ventricular hypertrophy. Left ventricular diastolic parameters are  indeterminate.   2. Right ventricular systolic function is low normal. The right  ventricular size is normal. There is normal pulmonary artery systolic  pressure. The estimated right ventricular systolic pressure is 31.8 mmHg.   3. The mitral valve is degenerative. Mild to moderate mitral valve  regurgitation.   4. Tricuspid valve regurgitation is moderate.   5. The aortic valve is tricuspid. Aortic valve regurgitation is not  visualized. Aortic valve sclerosis is present, with no evidence of aortic  valve stenosis.   6. Aortic dilatation noted. There is mild dilatation of the ascending  aorta, measuring 41 mm.   7. The inferior vena cava is dilated in size with >50% respiratory  variability, suggesting right atrial pressure of 8 mmHg.   Comparison(s): Prior images reviewed side by side. LVEF stable in 45-50%  range. Normal estimated RVSP. Mild to moderate mitral and moderate  tricuspid regurgitation.  Right and left heart cath 03/2021:   Ost LM to Mid LM lesion is 40% stenosed.   1st Mrg lesion is 50% stenosed.   Prox Cx lesion is 50% stenosed.   Ost LAD to Prox LAD lesion is 90% stenosed.   Prox Cx to Mid Cx lesion is 99% stenosed.   Prox RCA to Mid RCA lesion is 100% stenosed.   Prox RCA lesion is 80% stenosed.   Origin lesion is 100% stenosed.   Origin to Prox Graft lesion is 100% stenosed.   LIMA graft was visualized by angiography and is normal in caliber.   1.  Severe underlying three-vessel coronary artery disease with patent LIMA to LAD.  Chronically occluded SVG to OM and SVG to diagonal.  The SVG to right PDA which was patent on most recent angiography in 2011 is now occluded.  Left circumflex stent from the proximal portion extending into OM1 is patent with moderate in-stent restenosis and subtotal occlusion of the AV groove left circumflex which was present before. 2.  Right heart catheterization  showed high normal filling pressures, no pulmonary hypertension and normal cardiac output.   Recommendations: The SVG to right PDA is now occluded which is new but the RCA has good left-to-right collaterals.  The AV groove left circumflex was subtotally occluded before and jailed by previous stent.  This could not be crossed with a wire before. Recommend continuing medical therapy.  Cardiac monitor 12/2020:  ZIO XT reviewed. 6 days, 8 hours analyzed. Predominant rhythm is sinus with prolonged PR interval and heart rate ranging from 39 bpm (early morning) up to 116 bpm and average heart rate 59 bpm. Rare PACs including couplets and triplets were noted representing less than 1% total beats. Rare PVCs including couplets and triplets were noted representing less than 1% total beats. There were also limited episodes of ventricular bigeminy and trigeminy. Multiple episodes of SVT were noted, these were generally brief with the longest lasting only 13 beats, nonsustained. There were no ventricular arrhythmias. No significant pauses.  Myoview 12/2020:    The study is low risk.   No ST deviation was noted. The ECG was negative for ischemia.   LV perfusion is abnormal. Defect 1: There is a small defect with mild reduction in  uptake present in the apical anterior location(s) that is fixed. There is normal wall motion in the defect area. Consistent with artifact caused by breast attenuation. Defect 2: There is a small defect with mild reduction in uptake present in the mid to basal inferior location(s) that is partially reversible. There is normal wall motion in the defect area. Consistent with ischemia.   Left ventricular function is normal. Nuclear stress EF: 61 %.   Breast attenuation artifact was present.   Low risk study with evidence of breast attenuation artifact and also mild mid to basal inferior ischemia, LVEF normal at 61%.  Carotid duplex 12/2020:  Summary:  Right Carotid: Velocities in the right  ICA are consistent with a 1-39%  stenosis. The ECA appears >50% stenosed.   Left Carotid: Velocities in the left ICA are consistent with a 1-39%  stenosis.   Vertebrals: Left vertebral artery demonstrates antegrade flow. Right  atypical antegrade.  Subclavians: Right subclavian artery flow was disturbed. Normal flow hemodynamics were seen in the left subclavian artery.  Echo 05/2020:  IMPRESSIONS    1. Abnormal septal motion distal septal apical mid and basal inferior  wall hypokinesis . Left ventricular ejection fraction, by estimation, is  45 to 50%. The left ventricle has mildly decreased function. The left  ventricle has no regional wall motion  abnormalities. The left ventricular internal cavity size was mildly  dilated. There is moderate left ventricular hypertrophy. Left ventricular  diastolic parameters are consistent with Grade I diastolic dysfunction  (impaired relaxation).   2. Right ventricular systolic function is normal. The right ventricular  size is normal.   3. Left atrial size was mildly dilated.   4. The mitral valve is degenerative. Mild mitral valve regurgitation. No  evidence of mitral stenosis.   5. The aortic valve is tricuspid. Aortic valve regurgitation is not  visualized. Mild aortic valve sclerosis is present, with no evidence of  aortic valve stenosis.   6. The inferior vena cava is normal in size with greater than 50%  respiratory variability, suggesting right atrial pressure of 3 mmHg.   Risk Assessment/Calculations:  CHA2DS2-VASc score is 7.   The 10-year ASCVD risk score (Arnett DK, et al., 2019) is: 63.7%   Values used to calculate the score:     Age: 42 years     Sex: Female     Is Non-Hispanic African American: No     Diabetic: Yes     Tobacco smoker: Yes     Systolic Blood Pressure: 136 mmHg     Is BP treated: Yes     HDL Cholesterol: 55 mg/dL     Total Cholesterol: 131 mg/dL  Review of Systems    All other systems reviewed and are  otherwise negative except as noted above.  Physical Exam    VS:  BP 136/80   Pulse (!) 51   Ht 5\' 3"  (1.6 m)   Wt 218 lb 6.4 oz (99.1 kg)   SpO2 98%   BMI 38.69 kg/m  , BMI Body mass index is 38.69 kg/m.  Wt Readings from Last 3 Encounters:  01/24/23 218 lb 6.4 oz (99.1 kg)  12/10/22 222 lb 9.6 oz (101 kg)  11/15/22 212 lb 12.8 oz (96.5 kg)     GEN: Obese, 77 y.o. female in no acute distress. HEENT: normal. Neck: Supple, no JVD, carotid bruits, or masses. Cardiac: S1/S2, slow rate and regular rhythm, no murmurs, rubs, or gallops. No clubbing, cyanosis, nonpitting edema to BLE.  Radials/PT 2+ and equal bilaterally.  Respiratory:  Respirations regular and unlabored, clear to auscultation bilaterally. MS: No deformity or atrophy. Skin: Warm and dry, no rash. Neuro:  Strength and sensation are intact. Psych: Normal affect.  Assessment & Plan    HFmrEF, leg edema Stage C, NYHA class II-III symptoms. EF 10/2022 45-50%.  Weight is down 4 pounds from last office visit, patient does admit to stress and eating more.  Does admit to some more worsening shortness of breath, etiology multifactorial.  I do believe that stress has made her eat and weight may be more due to caloric intake than fluid volume. No signs of JVD or signs of volume overload on exam, difficult to ascertain fluid status d/t patient's BMI, not in acute distress.  Says she got labs with Dr. Wolfgang Phoenix, patient requested these labs to be faxed to our office.  We have not received these labs.  Will request these labs to be sent to our office as soon as possible.  If these labs are not received, patient is agreeable for [place] to obtain labs.  Recommend obtaining BMET, and possibly also proBNP and magnesium. Continue valsartan, Farxiga, furosemide, Lopressor and Imdur. Low sodium diet, fluid restriction <2L, and daily weights encouraged. Educated to contact our office for weight gain of 2 lbs overnight or 5 lbs in one week. ED precautions  discussed.  Nonpitting BLE on exam.  Will write Rx for compression stockings.  Multivessel CAD, s/p CABG in 1995 Patient admits to episode of left-sided chest pressure at rest, however this is difficult for her to recall.  She has some evident recall/forgetfulness/memory issues.  Nuys any recent chest pain.  No indication for ischemic evaluation.  NST in 2022 was low risk. Continue Aspirin, Imdur, Lopressor, pravastatin, and valsartan.  Heart healthy diet and regular cardiovascular exercise encouraged. ED precautions discussed.   HLD, hx of mesenteric ischemia  She is status post right hemicolectomy and right iliac to SMA bypass performed at [facility] in 2021. Denies any issues. Recent LDL 59. Continue current medication regimen. Heart healthy diet and regular cardiovascular exercise encouraged.   HTN BP stable.  Discussed to monitor BP at home at least 2 hours after medications and sitting for 5-10 minutes. Continue current medication regimen. Heart healthy diet and regular cardiovascular exercise encouraged.  If kidney function permits, consider at next office visit switching valsartan to [facility].  Atypical A-flutter, post-op A-fib, bradycardia Does have previous hx of post-op A-fib and noted to have atypical A-flutter on ECG at [facility] 05/2022. Denies any tachycardia or palpitations. HR 50 bpm today, asymptomatic. Continue Eliquis 5 mg BID, on appropriate dosage and denies any bleeding issues. Continue Lopressor.  Will continue to monitor.  6. Valvular insufficiency Recent TTE revealed moderate TR, along with mild to moderate MR. Recommend updating Echo in 1-2 years or sooner if clinically indicated.   7. Aortic dilatation Mild dilatation of ascending aorta on TTE, measuring 41 mm. Recommend updating TTE in 1 year for monitoring.   8. AKI on CKD stage 3 Most recent labs revealed elevated at 1.61 with eGFR at 33.  She has had more recent labs with Dr. Wolfgang Phoenix which have not been faxed over  to our office at this time.  We will request these labs to be faxed to our office.  Encouraged adequate hydration.  Avoid nephrotoxic agents.  Continue to follow with PCP and Nephrology.  5. Tobacco use Smoking cessation encouraged and discussed.    Disposition: Follow up in 4-6  weeks with Nona Dell, MD or APP.  Signed, Sharlene Dory, NP

## 2023-01-24 NOTE — Patient Instructions (Addendum)
Medication Instructions:  Your physician recommends that you continue on your current medications as directed. Please refer to the Current Medication list given to you today.  Labwork: Pending   Testing/Procedures: None  Follow-Up: Your physician recommends that you schedule a follow-up appointment in: 4-6 weeks   Any Other Special Instructions Will Be Listed Below (If Applicable).  If you need a refill on your cardiac medications before your next appointment, please call your pharmacy.

## 2023-01-29 ENCOUNTER — Telehealth: Payer: Self-pay

## 2023-01-29 ENCOUNTER — Ambulatory Visit: Payer: Self-pay | Admitting: *Deleted

## 2023-01-29 DIAGNOSIS — G4733 Obstructive sleep apnea (adult) (pediatric): Secondary | ICD-10-CM

## 2023-01-29 NOTE — Patient Outreach (Signed)
Care Coordination   Follow Up Visit Note   01/30/2023 updated note for 01/29/23 Name: LYNITA BUETOW MRN: [id number] DOB: Aug 24, 1945  IRETTA BURGUENO is a 77 y.o. year old female who sees Anabel Halon, MD for primary care. I spoke with grand daughter, [name] of [employer] by phone today. Patient is reported to be hard of hearing   What matters to the patients health and wellness today?  Home Sleep study results from July 2024  Sleep study- collaboration with Dr Allena Katz lead to the response "showed mild sleep apnea. She had it done through pulmonology. She can ask them for better clarification."  Attempts to reach grand daughter after her work hours unsuccessful on 01/29/23 & 01/30/23  Goals Addressed             This Visit's Progress    obtain sleep study results care coordination   Not on track    Interventions Today    Flowsheet Row Most Recent Value  Chronic Disease   Chronic disease during today's visit Other  [hard of hearing, Sleep study results]  General Interventions   General Interventions Discussed/Reviewed General Interventions Discussed, Durable Medical Equipment (DME), [facility], Doctor Visits, Communication with  Doctor Visits Discussed/Reviewed Doctor Visits Discussed, PCP, Specialist  [Dr Rutwik is pcp, Pulmonology is Dr Craige Cotta with assist of NP E Walsh]  Durable Medical Equipment (DME) Other  [CPAP]  PCP/Specialist Visits Compliance with follow-up visit  Communication with --  [attempts to reach [place] after 4:30 vs during her work hours on 01/29/23 & 01/30/23 as discussed without success x 2 Left her a voice message updating her that results would come from the pulmonologist and a follow up appointment is needed+new settings]  Education Interventions   Education Provided Provided Education  [home sleep study process, setting for CPAP MD follow up]  Provided Verbal Education On [facility], Other  Mental Health Interventions   Mental  Health Discussed/Reviewed Mental Health Discussed, Coping Strategies  Safety Interventions   Safety Discussed/Reviewed Safety Discussed, Home Safety  Home Safety Assistive Devices              SDOH assessments and interventions completed:  Yes     Care Coordination Interventions:  Yes, provided   Follow up plan:  Pending a return call from grand daughter [name] or a rescheduled appointment with assist from [facility] assistant    Encounter Outcome:  Patient Visit Completed   [name] L. Noelle Penner, RN, BSN, CCM, Care Management Coordinator [phone]

## 2023-01-29 NOTE — Telephone Encounter (Signed)
-----   Message from Nurse Claretha Cooper sent at 01/29/2023  5:16 PM EDT ----- Regarding: Results of sleep study [name], family member of patient wants to know the results & follow up plans for home sleep study that was complete Kimberly L. Noelle Penner, RN, BSN, CCM, Care Management Coordinator [phone]

## 2023-01-29 NOTE — Telephone Encounter (Signed)
Sleep study was done on 7/30 has been scanned into her chart.

## 2023-01-30 NOTE — Patient Instructions (Signed)
Visit Information  Thank you for taking time to visit with me today. Please don't hesitate to contact me if I can be of assistance to you.   Following are the goals we discussed today:   Goals Addressed             This Visit's Progress    obtain sleep study results care coordination   Not on track    Interventions Today    Flowsheet Row Most Recent Value  Chronic Disease   Chronic disease during today's visit Other  [hard of hearing, Sleep study results]  General Interventions   General Interventions Discussed/Reviewed General Interventions Discussed, Durable Medical Equipment (DME), Community Resources, Doctor Visits, Communication with  Doctor Visits Discussed/Reviewed Doctor Visits Discussed, PCP, Specialist  [Dr Rutwik is pcp, Pulmonology is Dr Craige Cotta with assist of NP E Walsh]  Durable Medical Equipment (DME) Other  [CPAP]  PCP/Specialist Visits Compliance with follow-up visit  Communication with --  [attempts to reach [place] after 4:30 vs during her work hours on 01/29/23 & 01/30/23 as discussed without success x 2 Left her a voice message updating her that results would come from the pulmonologist and a follow up appointment is needed+new settings]  Education Interventions   Education Provided Provided Education  [home sleep study process, setting for CPAP MD follow up]  Provided Verbal Education On [facility], Other  Mental Health Interventions   Mental Health Discussed/Reviewed Mental Health Discussed, Coping Strategies  Safety Interventions   Safety Discussed/Reviewed Safety Discussed, Home Safety  Home Safety Assistive Devices              Our next appointment is by telephone on pending at pending  Please call the care guide team at [phone] if you need to cancel or reschedule your appointment.   If you are experiencing a Mental Health or Behavioral Health Crisis or need someone to talk to, please call the Suicide and Crisis Lifeline: 988 call the  [place] National Suicide Prevention Lifeline: 570-214-0565 or TTY: (774)367-4392 TTY 863-418-1552) to talk to a trained counselor call 1-800-273-TALK (toll free, 24 hour hotline) call the [facility]: 732-411-2426 call 911   No computer access, no preference for copy of AVS       The patient has been provided with contact information for the care management team and has been advised to call with any health related questions or concerns.   Merl Bommarito L. Noelle Penner, RN, BSN, CCM, Care Management Coordinator [phone]

## 2023-01-30 NOTE — Telephone Encounter (Signed)
HST showed mild OSA, average 7 apneas per hour with no significant oxygen desaturation  If she would like to resume CPAP we can place an order auto 5-15cm h20 and schedule visit in 31-90 days with me for compliance check (Friday afternoon-virtual slot ok)

## 2023-01-30 NOTE — Telephone Encounter (Signed)
Lm x1 for [name] to return my call.

## 2023-01-31 ENCOUNTER — Ambulatory Visit: Payer: Self-pay | Admitting: *Deleted

## 2023-01-31 ENCOUNTER — Telehealth: Payer: Self-pay | Admitting: *Deleted

## 2023-01-31 NOTE — Progress Notes (Signed)
Care Coordination Note  01/31/2023 Name: [name] MRN: [id number] DOB: 1946-02-16  [name] is a 76 y.o. year old female who is a primary care patient of Anabel Halon, MD and is actively engaged with the care management team. I reached out to Yvonne Kendall by phone today to assist with re-scheduling a follow up visit with the RN Case Manager  Follow up plan: Unsuccessful telephone outreach attempt made. A HIPAA compliant phone message was left for the patient providing contact information and requesting a return call.   [facility]  Care Coordination Care Guide  Direct Dial: [phone]

## 2023-01-31 NOTE — Telephone Encounter (Signed)
I have notified [name](DPR). She said the patient is wanting to go with the CPAP machine. She has used [facility] in the past. I told her I would place the order and [facility] will reach out to them about getting the machine. They will call back once she is set up with a machine to schedule a compliance appt.  Nothing further needed.

## 2023-01-31 NOTE — Patient Outreach (Signed)
Care Coordination   Follow Up Visit Note   02/07/2023 late entry from 01/31/23 Name: [name] MRN: [id number] DOB: 02/25/1946  [name] is a 77 y.o. year old female who sees Anabel Halon, MD for primary care. I spoke with [name], grand daughter, of  [name] by phone today.  What matters to the patients health and wellness today?  Updated her on the status on the home sleep study and the need to follow up with the pulmonology  [name] will assist with getting a pulmonology follow up visit Grand daughter shared the trauma patient has endured (loss of children, parent) , some reasons for her difficulty with engagement [place] daughter agrees to assist with a conference call to patient for engagement and possible review of advance directives/goals of care that may lead to mental health engagement Ex spouse is now living with her to encourage nutrition & care Patient is not wanting smoke cessation and per grand daughter can be verbally aggressive Not interacting with various family members    Goals Addressed             This Visit's Progress    obtain sleep study results care coordination       Interventions Today    Flowsheet Row Most Recent Value  Chronic Disease   Chronic disease during today's visit Chronic Obstructive Pulmonary Disease (COPD), Other, Chronic Kidney Disease/End Stage Renal Disease (ESRD), Diabetes, Atrial Fibrillation (AFib), Hypertension (HTN)  [smokes and does not want to stop, non compliance]  General Interventions   General Interventions Discussed/Reviewed General Interventions Reviewed, [facility], Doctor Visits  Doctor Visits Discussed/Reviewed Doctor Visits Reviewed, PCP, Database administrator (DME) Other  [CPAP]  Exercise Interventions   Exercise Discussed/Reviewed Exercise Discussed, Weight Managment  Weight Management Weight loss  [eating better with assist of ex husband who is now staying with  her]  Education Interventions   Provided Verbal Education On Nutrition, Mental Health/Coping with Illness, Community Resources  Mental Health Interventions   Mental Health Discussed/Reviewed Mental Health Reviewed, Coping Strategies, Grief and Loss, Depression  Nutrition Interventions   Nutrition Discussed/Reviewed Nutrition Discussed, Fluid intake, Supplemental nutrition  Pharmacy Interventions   Pharmacy Dicussed/Reviewed Pharmacy Topics Discussed, Medications and their functions, Affording Medications  Advanced Directive Interventions   Advanced Directives Discussed/Reviewed Advanced Directives Discussed, Advanced Care Planning              SDOH assessments and interventions completed:  No  SDOH Interventions Today    Flowsheet Row Most Recent Value  SDOH Interventions   Depression Interventions/Treatment  Patient refuses Treatment, Medication        Care Coordination Interventions:  Yes, provided   Follow up plan: Follow up call scheduled for 02/13/23    Encounter Outcome:  Patient Visit Completed   [name] L. Noelle Penner, RN, BSN, CCM, Care Management Coordinator [phone]

## 2023-02-01 ENCOUNTER — Other Ambulatory Visit: Payer: Self-pay | Admitting: Cardiology

## 2023-02-01 ENCOUNTER — Encounter: Payer: Self-pay | Admitting: *Deleted

## 2023-02-01 ENCOUNTER — Other Ambulatory Visit: Payer: Self-pay | Admitting: Internal Medicine

## 2023-02-01 DIAGNOSIS — I739 Peripheral vascular disease, unspecified: Secondary | ICD-10-CM

## 2023-02-01 DIAGNOSIS — I25118 Atherosclerotic heart disease of native coronary artery with other forms of angina pectoris: Secondary | ICD-10-CM

## 2023-02-01 DIAGNOSIS — F419 Anxiety disorder, unspecified: Secondary | ICD-10-CM

## 2023-02-01 NOTE — Telephone Encounter (Signed)
Rx refill sent to pharmacy. 

## 2023-02-07 ENCOUNTER — Ambulatory Visit: Payer: Self-pay | Admitting: *Deleted

## 2023-02-07 NOTE — Patient Outreach (Signed)
Care Coordination   Multidisciplinary Case Review Note    02/07/2023 Name: [name] MRN: [id number] DOB: [birth year]  [name] is a 77 y.o. year old female who sees Anabel Halon, MD for primary care.  The  multidisciplinary care team met today to review patient care needs and barriers.     Goals Addressed             This Visit's Progress    obtain sleep study results care coordination   Not on track    Interventions Today    Flowsheet Row Most Recent Value  Chronic Disease   Chronic disease during today's visit Chronic Obstructive Pulmonary Disease (COPD), Other, Chronic Kidney Disease/End Stage Renal Disease (ESRD), Diabetes, Atrial Fibrillation (AFib), Hypertension (HTN)  [smokes and does not want to stop, non compliance]  General Interventions   General Interventions Discussed/Reviewed General Interventions Reviewed, [facility], Doctor Visits  Doctor Visits Discussed/Reviewed Doctor Visits Reviewed, PCP, Database administrator (DME) Other  [CPAP]  Exercise Interventions   Exercise Discussed/Reviewed Exercise Discussed, Weight Managment  Weight Management Weight loss  [eating better with assist of ex husband who is now staying with her]  Education Interventions   Provided Verbal Education On Nutrition, Mental Health/Coping with Illness, Community Resources  Mental Health Interventions   Mental Health Discussed/Reviewed Mental Health Reviewed, Coping Strategies, Grief and Loss, Depression  Nutrition Interventions   Nutrition Discussed/Reviewed Nutrition Discussed, Fluid intake, Supplemental nutrition  Pharmacy Interventions   Pharmacy Dicussed/Reviewed Pharmacy Topics Discussed, Medications and their functions, Affording Medications  Advanced Directive Interventions   Advanced Directives Discussed/Reviewed Advanced Directives Discussed, Advanced Care Planning              SDOH assessments and interventions completed:   No     Care Coordination Interventions Activated:  Yes   Care Coordination Interventions:  Yes, provided   Follow up plan: Follow up call scheduled for 02/13/23    Multidisciplinary Team Attendees:   Edd Arbour, Veto Kemps Clemons  Scribe for Multidisciplinary Case Review:  Kathreen Cosier. Noelle Penner, RN, BSN, [facility]  VBCI Care Management Coordinator  [phone]  Fax: [phone]

## 2023-02-07 NOTE — Patient Instructions (Signed)
Visit Information  Thank you for taking time to visit with me today. Please don't hesitate to contact me if I can be of assistance to you.   Following are the goals we discussed today:   Goals Addressed             This Visit's Progress    obtain sleep study results care coordination       Interventions Today    Flowsheet Row Most Recent Value  Chronic Disease   Chronic disease during today's visit Chronic Obstructive Pulmonary Disease (COPD), Other, Chronic Kidney Disease/End Stage Renal Disease (ESRD), Diabetes, Atrial Fibrillation (AFib), Hypertension (HTN)  [smokes and does not want to stop, non compliance]  General Interventions   General Interventions Discussed/Reviewed General Interventions Reviewed, [facility], Doctor Visits  Doctor Visits Discussed/Reviewed Doctor Visits Reviewed, PCP, Specialist  Durable Medical Equipment (DME) Other  [CPAP]  Exercise Interventions   Exercise Discussed/Reviewed Exercise Discussed, Weight Managment  Weight Management Weight loss  [eating better with assist of ex husband who is now staying with her]  Education Interventions   Provided Verbal Education On Nutrition, Mental Health/Coping with Illness, Community Resources  Mental Health Interventions   Mental Health Discussed/Reviewed Mental Health Reviewed, Coping Strategies, Grief and Loss, Depression  Nutrition Interventions   Nutrition Discussed/Reviewed Nutrition Discussed, Fluid intake, Supplemental nutrition  Pharmacy Interventions   Pharmacy Dicussed/Reviewed Pharmacy Topics Discussed, Medications and their functions, Affording Medications  Advanced Directive Interventions   Advanced Directives Discussed/Reviewed Advanced Directives Discussed, Advanced Care Planning              Our next appointment is by telephone on 1016/24 at 3:45 pm  Please call the care guide team at [phone] if you need to cancel or reschedule your appointment.   If you are experiencing  a Mental Health or Behavioral Health Crisis or need someone to talk to, please call the Suicide and Crisis Lifeline: 988 call the [place] National Suicide Prevention Lifeline: (949)855-5543 or TTY: 564 363 7675 TTY 224 219 2698) to talk to a trained counselor call 1-800-273-TALK (toll free, 24 hour hotline) call the [facility]: 650-540-1628 call 911   No computer access, no preference for copy of AVS      The patient has been provided with contact information for the care management team and has been advised to call with any health related questions or concerns.   Giselle Brutus L. Noelle Penner, RN, BSN, [facility]  VBCI Care Management Coordinator  [phone]  Fax: [phone]

## 2023-02-07 NOTE — Patient Instructions (Signed)
Visit Information  Thank you for taking time to visit with me today. Please don't hesitate to contact me if I can be of assistance to you.   Following are the goals we discussed today:   Goals Addressed             This Visit's Progress    obtain sleep study results care coordination   Not on track    Interventions Today    Flowsheet Row Most Recent Value  Chronic Disease   Chronic disease during today's visit Diabetes, Chronic Obstructive Pulmonary Disease (COPD), Hypertension (HTN), Atrial Fibrillation (AFib), Chronic Kidney Disease/End Stage Renal Disease (ESRD), Other  General Interventions   General Interventions Discussed/Reviewed General Interventions Reviewed, Communication with  Communication with RN, Social Work  [employer] [employer] team meeting _ agree with conference initial engagement with grand daughter to discuss advance directives with hope of leading to mental health resources]              Our next appointment is by telephone on 02/13/23 at 3:45 pm  Please call the care guide team at [phone] if you need to cancel or reschedule your appointment.   If you are experiencing a Mental Health or Behavioral Health Crisis or need someone to talk to, please call the Suicide and Crisis Lifeline: 988 call the [place] National Suicide Prevention Lifeline: 5031426907 or TTY: (873)832-9354 TTY 778-559-5203) to talk to a trained counselor call 1-800-273-TALK (toll free, 24 hour hotline) call the [facility]: [phone] call 911   Patient verbalizes understanding of instructions and care plan provided today and agrees to view in MyChart. Active MyChart status and patient understanding of how to access instructions and care plan via MyChart confirmed with patient.     The patient has been provided with contact information for the care management team and has been advised to call with any health related questions or concerns.    Ercel Normoyle L. Noelle Penner,  RN, BSN, [facility]  VBCI Care Management Coordinator  [phone]  Fax: [phone]

## 2023-02-09 DIAGNOSIS — J449 Chronic obstructive pulmonary disease, unspecified: Secondary | ICD-10-CM | POA: Diagnosis not present

## 2023-02-13 ENCOUNTER — Ambulatory Visit: Payer: Self-pay | Admitting: *Deleted

## 2023-02-13 NOTE — Patient Outreach (Addendum)
Care Coordination   Follow Up Visit Note   02/13/2023 Name: [name] MRN: [id number] DOB: [birth year]  [name] is a 77 y.o. year old female who sees Anabel Halon, MD for primary care. I spoke with  grand daughter, [name] of ERMIE PELCHAT by phone today.  What matters to the patients health and wellness today?  Doing well per Selena Batten grand daughter Has not been visited lately continues to be busy facilities per grand daughter  Patient with non compliance as she states related to her age and enjoying life  Noted follow up with pulmonologist to confirm getting a CPAP form [facility] requested RN Cm to hold on [place] returned to the phone and requested to call RN Cm back.   Goals Addressed             This Visit's Progress    obtain CPAP care coordination, increase engagement/compliance - care coordination services       Patient will  obtain a CPAP via [facility]   Interventions Today    Flowsheet Row Most Recent Value  Chronic Disease   Chronic disease during today's visit Other  [follow up on care coordination multidisciplinary team meeting, attempt to engage patient with assist of grand daughter]  General Interventions   General Interventions Discussed/Reviewed General Interventions Reviewed, Doctor Visits, Community Resources  Doctor Visits Discussed/Reviewed Doctor Visits Reviewed, PCP  PCP/Specialist Visits Compliance with follow-up visit  Education Interventions   Education Provided Provided Education  Provided Verbal Education On Mental Health/Coping with Illness, Other  [care coordination multidisciplinary team meeting, goals of care POA/Living will]  Mental Health Interventions   Mental Health Discussed/Reviewed Mental Health Reviewed, Coping Strategies  Advanced Directive Interventions   Advanced Directives Discussed/Reviewed Advanced Directives Discussed              SDOH assessments and interventions completed:  No      Care Coordination Interventions:  Yes, provided   Follow up plan: Follow up call scheduled for 03/18/23 3:30 pm    Encounter Outcome:  Patient Visit Completed   Admir Candelas L. Noelle Penner, RN, BSN, [facility]  VBCI Care Management Coordinator  [phone]  Fax: [phone]

## 2023-02-13 NOTE — Patient Instructions (Signed)
Visit Information  Thank you for taking time to visit with me today. Please don't hesitate to contact me if I can be of assistance to you.   Following are the goals we discussed today:   Goals Addressed             This Visit's Progress    obtain CPAP care coordination, increase engagement/compliance - care coordination services       Patient will  obtain a CPAP via [facility]   Interventions Today    Flowsheet Row Most Recent Value  Chronic Disease   Chronic disease during today's visit Other  [follow up on care coordination multidisciplinary team meeting, attempt to engage patient with assist of grand daughter]  General Interventions   General Interventions Discussed/Reviewed General Interventions Reviewed, Doctor Visits, Community Resources  Doctor Visits Discussed/Reviewed Doctor Visits Reviewed, PCP  PCP/Specialist Visits Compliance with follow-up visit  Education Interventions   Education Provided Provided Education  Provided Verbal Education On Mental Health/Coping with Illness, Other  [care coordination multidisciplinary team meeting, goals of care POA/Living will]  Mental Health Interventions   Mental Health Discussed/Reviewed Mental Health Reviewed, Coping Strategies  Advanced Directive Interventions   Advanced Directives Discussed/Reviewed Advanced Directives Discussed              Our next appointment is by telephone on 03/18/23  at 3:30 pm  Please call the care guide team at [phone] if you need to cancel or reschedule your appointment.   If you are experiencing a Mental Health or Behavioral Health Crisis or need someone to talk to, please call the Suicide and Crisis Lifeline: 988 call the [place] National Suicide Prevention Lifeline: 8195075957 or TTY: 361-153-0858 TTY 9173107165) to talk to a trained counselor call 1-800-273-TALK (toll free, 24 hour hotline) call the [facility]: ([phone] call 911   No computer access, no  preference for copy of AVS     The patient has been provided with contact information for the care management team and has been advised to call with any health related questions or concerns.   Holt Woolbright L. Noelle Penner, RN, BSN, [facility]  VBCI Care Management Coordinator  [phone]  Fax: [phone]

## 2023-02-19 DIAGNOSIS — G4733 Obstructive sleep apnea (adult) (pediatric): Secondary | ICD-10-CM | POA: Diagnosis not present

## 2023-02-19 DIAGNOSIS — N189 Chronic kidney disease, unspecified: Secondary | ICD-10-CM | POA: Diagnosis not present

## 2023-02-19 DIAGNOSIS — N170 Acute kidney failure with tubular necrosis: Secondary | ICD-10-CM | POA: Diagnosis not present

## 2023-02-19 DIAGNOSIS — N2581 Secondary hyperparathyroidism of renal origin: Secondary | ICD-10-CM | POA: Diagnosis not present

## 2023-02-19 DIAGNOSIS — R809 Proteinuria, unspecified: Secondary | ICD-10-CM | POA: Diagnosis not present

## 2023-02-19 LAB — MICROALBUMIN / CREATININE URINE RATIO: Microalb Creat Ratio: 145

## 2023-02-19 LAB — PROTEIN / CREATININE RATIO, URINE: Creatinine, Urine: 36.6

## 2023-02-26 LAB — HEMOGLOBIN A1C
EGFR: 38
Hemoglobin A1C: 6.6

## 2023-03-06 ENCOUNTER — Other Ambulatory Visit: Payer: Self-pay | Admitting: Family Medicine

## 2023-03-06 DIAGNOSIS — Z720 Tobacco use: Secondary | ICD-10-CM

## 2023-03-07 ENCOUNTER — Encounter: Payer: Self-pay | Admitting: Nurse Practitioner

## 2023-03-07 ENCOUNTER — Ambulatory Visit: Payer: Medicare Other | Attending: Nurse Practitioner | Admitting: Nurse Practitioner

## 2023-03-07 ENCOUNTER — Telehealth: Payer: Self-pay | Admitting: Internal Medicine

## 2023-03-07 VITALS — BP 90/60 | HR 83 | Ht 63.0 in | Wt 220.0 lb

## 2023-03-07 DIAGNOSIS — I77819 Aortic ectasia, unspecified site: Secondary | ICD-10-CM | POA: Diagnosis not present

## 2023-03-07 DIAGNOSIS — I9789 Other postprocedural complications and disorders of the circulatory system, not elsewhere classified: Secondary | ICD-10-CM | POA: Diagnosis not present

## 2023-03-07 DIAGNOSIS — R001 Bradycardia, unspecified: Secondary | ICD-10-CM

## 2023-03-07 DIAGNOSIS — K559 Vascular disorder of intestine, unspecified: Secondary | ICD-10-CM | POA: Diagnosis not present

## 2023-03-07 DIAGNOSIS — Z720 Tobacco use: Secondary | ICD-10-CM

## 2023-03-07 DIAGNOSIS — I484 Atypical atrial flutter: Secondary | ICD-10-CM

## 2023-03-07 DIAGNOSIS — N183 Chronic kidney disease, stage 3 unspecified: Secondary | ICD-10-CM | POA: Diagnosis not present

## 2023-03-07 DIAGNOSIS — I2510 Atherosclerotic heart disease of native coronary artery without angina pectoris: Secondary | ICD-10-CM

## 2023-03-07 DIAGNOSIS — I38 Endocarditis, valve unspecified: Secondary | ICD-10-CM | POA: Diagnosis not present

## 2023-03-07 DIAGNOSIS — I5022 Chronic systolic (congestive) heart failure: Secondary | ICD-10-CM | POA: Diagnosis not present

## 2023-03-07 DIAGNOSIS — I10 Essential (primary) hypertension: Secondary | ICD-10-CM

## 2023-03-07 DIAGNOSIS — I951 Orthostatic hypotension: Secondary | ICD-10-CM

## 2023-03-07 DIAGNOSIS — R600 Localized edema: Secondary | ICD-10-CM

## 2023-03-07 DIAGNOSIS — I4891 Unspecified atrial fibrillation: Secondary | ICD-10-CM

## 2023-03-07 MED ORDER — OMRON 3 SERIES BP MONITOR DEVI: 1 refills | Status: AC

## 2023-03-07 MED ORDER — METOPROLOL TARTRATE 25 MG PO TABS: 12.5000 mg | ORAL_TABLET | Freq: Two times a day (BID) | ORAL | 1 refills | Status: DC | PRN

## 2023-03-07 MED ORDER — MIDODRINE HCL 2.5 MG PO TABS: 2.5000 mg | ORAL_TABLET | Freq: Three times a day (TID) | ORAL | 1 refills | Status: DC | PRN

## 2023-03-07 MED ORDER — ISOSORBIDE MONONITRATE ER 30 MG PO TB24: 30.0000 mg | ORAL_TABLET | Freq: Every day | ORAL | 6 refills | Status: DC

## 2023-03-07 NOTE — Telephone Encounter (Signed)
[name] has tried to contact patient in regards to cpap machine

## 2023-03-07 NOTE — Progress Notes (Signed)
Office Visit    Patient Name: [name] Date of Encounter: 03/07/2023 PCP:  Anabel Halon, MD [facility]  Cardiologist:  Nona Dell, MD  Advanced Practice Provider:  No care team member to display Electrophysiologist:  None   Chief Complaint and HPI    [name] is a 77 y.o. female with a hx of atypical A-flutter, CAD, s/p CABG in 1995, history of past stenting, hypertension, mixed hyperlipidemia, HFmrEF, OSA on CPAP, schizophrenia, type 2 diabetes, history of TIA, postop A-fib, and history of mesenteric ischemia, presents today for follow-up.   Previous CV history includes CABG in 1995 (LIMA-LAD, SVG-diagonal, SVG-OM, and SVG-PDA).  Underwent cardiac catheterization in 2011, received DES to circumflex.  Cardiac catheterization in 2022 revealed occluded grafts, patency of LIMA-LAD.  Was noted to have moderate in-stent restenosis in circumflex/OM distribution, was recommended to be medically managed.  Myoview in 2022 revealed normal EF.  Was evaluated at [facility] in February 2024 for near syncope.  She had this experience after taking NTG.  Due to her mild orthostasis, it was felt that she was somewhat dehydrated and was given IV fluids.  CT of the head negative.  Antihypertensive regimen was decreased.  Last seen by Dr. Diona Browner on July 16, 2022.  She noted feeling better, dizziness improved.  She did note feeling weak with low stamina, this had been a chronic issue.  EKG in office revealed A-fib with nonspecific ST changes.  CHA2DS2-VASc score was found to be 7, discussion of anticoagulation was discussed.  Plavix was stopped and replaced with Eliquis 5 mg twice daily.  12/10/2022 - Today she presented for follow-up with her granddaughter. Pt is a poor historian, and there appeared to be some compliance issues with her medications. She was still taking Plavix in addition to Aspirin and Eliquis. She did not start [place] as she did not want  this to cause a yeast infection. Weight was up from last OV. Her dizziness had improved. Denied any chest pain, did admit to NYHA class II-III symptoms. Was requesting to change fluid pill, said she was not able to tolerate Torsemide as this was causing increased frequency/urgency, follows Dr. Wolfgang Phoenix and noted upcoming labs arranged. Denied any recent palpitations, syncope, presyncope, dizziness, orthopnea, PND, swelling, acute bleeding, or claudication.  Medications were adjusted.  Patient politely deferred lab work to Dr. Wolfgang Phoenix.   01/24/2023 -presents for follow-up with her granddaughter who assists her to her doctors appointments.  Poor historian and has forgetfulness at times.  Says she believes her shortness of breath appears to be worse from last office visit, weight is down 4 pounds per our records from last office visit.  Says her shortness of breath causes her to take breaks, sit down.  Says she feels like she is losing her balance.  She feels as though she is tired feels like she is giving out.  Reports an episode of chest pressure located along the left side at rest, says she took NTG, but unable to recall episode. Granddaughter questions patient's report at times.  Patient also believes that her symptoms are due to stress, currently has a relative living in her house that is adding to a stressful situation. Denies any abuse, SI/HI.  Denies any palpitations, syncope, presyncope, dizziness, orthopnea, PND, swelling or significant weight changes, acute bleeding, or claudication.  03/07/2023 - Presents for follow-up with her granddaughter. Admits to dizziness. Says she had a near fall recently, denies any acute injuries. Pt  is a difficult historian due to history of forgetfulness, but appears she has not had any episodes of syncope, but admits to near syncope. Denies any chest pain, but admits to left shoulder pain that she attributes to how she has been sitting in chair at home and scrolling on her  tablet. Denies any shortness of breath, palpitations, syncope, orthopnea, PND, swelling or significant weight changes, acute bleeding, or claudication.  EKGs/Labs/Other Studies Reviewed:   The following studies were reviewed today:   EKG:  EKG Interpretation Date/Time:  Thursday March 07 2023 16:11:03 EST Ventricular Rate:  86 PR Interval:    QRS Duration:  86 QT Interval:  306 QTC Calculation: 366 R Axis:   48  Text Interpretation: Atrial fibrillation Septal infarct , age undetermined When compared with ECG of 14-Mar-2021 00:26, PREVIOUS ECG IS PRESENT Confirmed by Sharlene Dory [id number] on 03/07/2023 4:12:26 PM    Echo 10/2022:  1. Left ventricular ejection fraction, by estimation, is 45 to 50%. The  left ventricle has mildly decreased function. The left ventricle  demonstrates regional wall motion abnormalities (see scoring  diagram/findings for description). There is mild left  ventricular hypertrophy. Left ventricular diastolic parameters are  indeterminate.   2. Right ventricular systolic function is low normal. The right  ventricular size is normal. There is normal pulmonary artery systolic  pressure. The estimated right ventricular systolic pressure is 31.8 mmHg.   3. The mitral valve is degenerative. Mild to moderate mitral valve  regurgitation.   4. Tricuspid valve regurgitation is moderate.   5. The aortic valve is tricuspid. Aortic valve regurgitation is not  visualized. Aortic valve sclerosis is present, with no evidence of aortic  valve stenosis.   6. Aortic dilatation noted. There is mild dilatation of the ascending  aorta, measuring 41 mm.   7. The inferior vena cava is dilated in size with >50% respiratory  variability, suggesting right atrial pressure of 8 mmHg.   Comparison(s): Prior images reviewed side by side. LVEF stable in 45-50%  range. Normal estimated RVSP. Mild to moderate mitral and moderate  tricuspid regurgitation.  Right and left heart  cath 03/2021:   Ost LM to Mid LM lesion is 40% stenosed.   1st Mrg lesion is 50% stenosed.   Prox Cx lesion is 50% stenosed.   Ost LAD to Prox LAD lesion is 90% stenosed.   Prox Cx to Mid Cx lesion is 99% stenosed.   Prox RCA to Mid RCA lesion is 100% stenosed.   Prox RCA lesion is 80% stenosed.   Origin lesion is 100% stenosed.   Origin to Prox Graft lesion is 100% stenosed.   LIMA graft was visualized by angiography and is normal in caliber.   1.  Severe underlying three-vessel coronary artery disease with patent LIMA to LAD.  Chronically occluded SVG to OM and SVG to diagonal.  The SVG to right PDA which was patent on most recent angiography in 2011 is now occluded.  Left circumflex stent from the proximal portion extending into OM1 is patent with moderate in-stent restenosis and subtotal occlusion of the AV groove left circumflex which was present before. 2.  Right heart catheterization showed high normal filling pressures, no pulmonary hypertension and normal cardiac output.   Recommendations: The SVG to right PDA is now occluded which is new but the RCA has good left-to-right collaterals.  The AV groove left circumflex was subtotally occluded before and jailed by previous stent.  This could not be crossed with a wire  before. Recommend continuing medical therapy.  Cardiac monitor 12/2020:  ZIO XT reviewed. 6 days, 8 hours analyzed. Predominant rhythm is sinus with prolonged PR interval and heart rate ranging from 39 bpm (early morning) up to 116 bpm and average heart rate 59 bpm. Rare PACs including couplets and triplets were noted representing less than 1% total beats. Rare PVCs including couplets and triplets were noted representing less than 1% total beats. There were also limited episodes of ventricular bigeminy and trigeminy. Multiple episodes of SVT were noted, these were generally brief with the longest lasting only 13 beats, nonsustained. There were no ventricular arrhythmias. No  significant pauses.  Myoview 12/2020:    The study is low risk.   No ST deviation was noted. The ECG was negative for ischemia.   LV perfusion is abnormal. Defect 1: There is a small defect with mild reduction in uptake present in the apical anterior location(s) that is fixed. There is normal wall motion in the defect area. Consistent with artifact caused by breast attenuation. Defect 2: There is a small defect with mild reduction in uptake present in the mid to basal inferior location(s) that is partially reversible. There is normal wall motion in the defect area. Consistent with ischemia.   Left ventricular function is normal. Nuclear stress EF: 61 %.   Breast attenuation artifact was present.   Low risk study with evidence of breast attenuation artifact and also mild mid to basal inferior ischemia, LVEF normal at 61%.  Carotid duplex 12/2020:  Summary:  Right Carotid: Velocities in the right ICA are consistent with a 1-39%  stenosis. The ECA appears >50% stenosed.   Left Carotid: Velocities in the left ICA are consistent with a 1-39%  stenosis.   Vertebrals: Left vertebral artery demonstrates antegrade flow. Right  atypical antegrade.  Subclavians: Right subclavian artery flow was disturbed. Normal flow hemodynamics were seen in the left subclavian artery.  Echo 05/2020:  IMPRESSIONS    1. Abnormal septal motion distal septal apical mid and basal inferior  wall hypokinesis . Left ventricular ejection fraction, by estimation, is  45 to 50%. The left ventricle has mildly decreased function. The left  ventricle has no regional wall motion  abnormalities. The left ventricular internal cavity size was mildly  dilated. There is moderate left ventricular hypertrophy. Left ventricular  diastolic parameters are consistent with Grade I diastolic dysfunction  (impaired relaxation).   2. Right ventricular systolic function is normal. The right ventricular  size is normal.   3. Left atrial  size was mildly dilated.   4. The mitral valve is degenerative. Mild mitral valve regurgitation. No  evidence of mitral stenosis.   5. The aortic valve is tricuspid. Aortic valve regurgitation is not  visualized. Mild aortic valve sclerosis is present, with no evidence of  aortic valve stenosis.   6. The inferior vena cava is normal in size with greater than 50%  respiratory variability, suggesting right atrial pressure of 3 mmHg.   Risk Assessment/Calculations:  CHA2DS2-VASc score is 7.   The 10-year ASCVD risk score (Arnett DK, et al., 2019) is: 35.6%   Values used to calculate the score:     Age: 22 years     Sex: Female     Is Non-Hispanic African American: No     Diabetic: Yes     Tobacco smoker: Yes     Systolic Blood Pressure: 90 mmHg     Is BP treated: Yes     HDL Cholesterol: 55 mg/dL  Total Cholesterol: 131 mg/dL  Review of Systems    All other systems reviewed and are otherwise negative except as noted above.  Physical Exam    VS:  BP 90/60 (BP Location: Left Arm)   Pulse 83   Ht 5\' 3"  (1.6 m)   Wt 220 lb (99.8 kg)   SpO2 96%   BMI 38.97 kg/m  , BMI Body mass index is 38.97 kg/m.  Wt Readings from Last 3 Encounters:  03/07/23 220 lb (99.8 kg)  01/24/23 218 lb 6.4 oz (99.1 kg)  12/10/22 222 lb 9.6 oz (101 kg)    Orthostatics:  Lying - 100/64, 69 Sitting- 108/66, 79 Standing- 84/64, 84 bpm  Repeat manual BP - 99/72  GEN: Obese, 77 y.o. female in no acute distress. HEENT: normal. Neck: Supple, no JVD, carotid bruits, or masses. Cardiac: S1/S2, irregularly irregular rhythm, no murmurs, rubs, or gallops. No clubbing, cyanosis, nonpitting edema to BLE.  Radials/PT 2+ and equal bilaterally.  Respiratory:  Respirations regular and unlabored, clear to auscultation bilaterally. MS: No deformity or atrophy. Skin: Warm and dry, no rash. Neuro:  Strength and sensation are intact. Psych: Normal affect.  Assessment & Plan    Orthostatic  hypotension History and physical exam positive for orthostatic hypotension and symptomatic during orthostatics, BP improved to 99/72, admits to presyncope but denies any syncope. Will d/c valsartan. Will decrease Lopressor (Metoprolol tartrate) to 12.5mg  twice a day as needed for palpitations OR SBP (top number) greater than 120 and decrease Imdur to 30 mg daily. Will begin midodrine 2.5 mg TID PRN for BP < 90/60. Given Rx for Omron cuff sent to requested pharmacy. No other medication changes at this time. Heart healthy diet encouraged. Care and ED precautions discussed.   HFmrEF, leg edema Stage C, NYHA class II-III symptoms. EF 10/2022 45-50%.  Weight is stable.  No signs of JVD or signs of volume overload on exam, difficult to ascertain fluid status d/t patient's BMI, not in acute distress. Medication changes as noted above, no other medication changes beyond that. GDMT limited d/t BP.  Low sodium diet, fluid restriction <2L, and daily weights encouraged. Educated to contact our office for weight gain of 2 lbs overnight or 5 lbs in one week. ED precautions discussed.  Nonpitting BLE on exam. Previously prescribed Rx for compression stockings.  Multivessel CAD, s/p CABG in 1995 Denies any chest pain, but admits to left shoulder/back pain. EKG reassuring today, appears etiology is MSK related.  No indication for ischemic evaluation.  NST in 2022 was low risk. Continue Aspirin, and pravastatin. Medication changes as noted above.  Heart healthy diet encouraged. ED precautions discussed.   HLD, hx of mesenteric ischemia  She is status post right hemicolectomy and right iliac to SMA bypass performed at [facility] in 2021. Denies any issues. Past LDL 59. Continue current medication regimen. Heart healthy diet and regular cardiovascular exercise encouraged.   HTN Hypotensive and orthostatics positive as noted above. Adjusting medications as noted above. Discussed to monitor BP at home at least 2 hours after  medications and sitting for 5-10 minutes. Heart healthy diet encouraged.   Atypical A-flutter, post-op A-fib, bradycardia Does have previous hx of post-op A-fib and noted to have atypical A-flutter on ECG at [facility] 05/2022. Denies any tachycardia or palpitations. HR well controlled today. Continue Eliquis 5 mg BID, on appropriate dosage and denies any bleeding issues. Adjusting medications as noted above.  6. Valvular insufficiency Past TTE revealed moderate TR, along with mild to  moderate MR. Recommend updating Echo in 1-2 years or sooner if clinically indicated.   7. Aortic dilatation Mild dilatation of ascending aorta on TTE, measuring 41 mm. Recommend updating TTE in 1 year for monitoring.   8. CKD stage 3 Most recent labs revealed stable kidney function. Encouraged adequate hydration.  Avoid nephrotoxic agents.  Continue to follow with PCP and Nephrology.  5. Tobacco use Smoking cessation encouraged and discussed.   Negative. See HPI.    Disposition: Care and ED precautions discussed. Follow up in 3-4 weeks with Nona Dell, MD or APP.  Signed, Sharlene Dory, NP

## 2023-03-07 NOTE — Patient Instructions (Addendum)
Medication Instructions:   Stop Valsartan (Diovan) Decrease Lopressor (Metoprolol tart) to 12.5mg  twice a day as needed for palpitations OR SBP (top number) greater than 120  Decrease Imdur to 30mg  daily  Begin Midodrine 2.5mg  three times per day as needed for SBP (systolic BP) less than 90/60 Continue all other medications.     Labwork:  none  Testing/Procedures:  none  Follow-Up:  3-4 weeks    Any Other Special Instructions Will Be Listed Below (If Applicable). Omron cuff sent to [facility] today.   If you need a refill on your cardiac medications before your next appointment, please call your pharmacy.

## 2023-03-07 NOTE — Telephone Encounter (Signed)
Copied from CRM [id number]. Topic: Clinical - Medical Advice >> Mar 07, 2023 10:21 AM Almira Coaster wrote: Reason for CRM: Patient's granddaughter [name]) is calling regarding patient's Z pap machine, it's been a couple of months since she's been approved but they have not received an update yet.

## 2023-03-08 DIAGNOSIS — J449 Chronic obstructive pulmonary disease, unspecified: Secondary | ICD-10-CM | POA: Diagnosis not present

## 2023-03-09 ENCOUNTER — Encounter: Payer: Self-pay | Admitting: Nurse Practitioner

## 2023-03-13 ENCOUNTER — Other Ambulatory Visit: Payer: Medicare Other | Admitting: Urology

## 2023-03-14 ENCOUNTER — Other Ambulatory Visit: Payer: Self-pay

## 2023-03-14 ENCOUNTER — Telehealth: Payer: Self-pay

## 2023-03-14 NOTE — Telephone Encounter (Signed)
Copied from CRM [id number]. Topic: Clinical - Medication Refill >> Mar 14, 2023  3:17 PM Adelina Mings wrote: Most Recent Primary Care Visit:  Provider: Anabel Halon  Department: [facility]  Visit Type: PHYSICAL  Date: 11/15/2022  Medication: calcitRIOL (ROCALTROL) 0.25 MCG capsule  Has the patient contacted their pharmacy? Yes (Agent: If no, request that the patient contact the pharmacy for the refill. If patient does not wish to contact the pharmacy document the reason why and proceed with request.) (Agent: If yes, when and what did the pharmacy advise?)No response   Is this the correct pharmacy for this prescription? Yes If no, delete pharmacy and type the correct one.  This is the patient's preferred pharmacy:  [facility], [place] - [address] [place] [address] Phone: [phone] Fax: [phone]  [facility] - [place], [place] - [address] [facility] [place] [employer] [address] [place] [address] Phone: [phone] Fax: [phone]  [facility] [place], [place] - [address] [place] [address] Phone: [phone] Fax: [phone]   Has the prescription been filled recently? Yes  Is the patient out of the medication? Yes  Has the patient been seen for an appointment in the last year OR does the patient have an upcoming appointment? Yes  Can we respond through MyChart? No  Agent: Please be advised that Rx refills may take up to 3 business days. We ask that you follow-up with your pharmacy.

## 2023-03-15 ENCOUNTER — Ambulatory Visit: Payer: Self-pay | Admitting: Internal Medicine

## 2023-03-15 NOTE — Telephone Encounter (Signed)
Copied from CRM [id number]. Topic: Clinical - Red Word Triage >> Mar 15, 2023 12:40 PM Deaijah H wrote: Red Word that prompted transfer to Nurse Triage: Granddaughter called in on behalf of pt for severe bladder pain   Chief Complaint: Bladder Pain Symptoms: Urination causes bladder pain.  Frequency: Acute Pertinent Negatives: Patient denies any other symptoms, per granddaughter.  Disposition: [] ED /[] Urgent Care (no appt availability in office) / [] Appointment(In office/virtual)/ []  [place] [facility]/ [] Home Care/ [] Refused Recommended Disposition /[] [place] [facility]/ [x]  Follow-up with PCP Additional Notes: [name], granddaughter called on patient's behalf, to obtain prescription refill, Calcitrol. States the patient has been attempting for a week to obtain the medication to no avail. Bladder pain occurred within the last 36 hours as a result per the granddaughter and patient. Appointment previously established. Routing to the PCP office for the appropriate intervention.    Reason for Disposition  [1] Prescription refill request for ESSENTIAL medicine (i.e., likelihood of harm to patient if not taken) AND [2] triager unable to refill per department policy  Answer Assessment - Initial Assessment Questions 1. LOCATION: "Where does it hurt?"      Over the bladder area. 2. RADIATION: "Does the pain shoot anywhere else?" (e.g., lower back, groin, thighs)     Bladder only, no radiation.  3. ONSET: "When did the pain begin?" (e.g., minutes, hours or days ago)      A couple of days.  4. SUDDEN: "Gradual or sudden onset?"     Gradual onset. 5. PATTERN "Does the pain come and go, or is it constant?"    - If constant: "Is it getting better, staying the same, or worsening?"      (Note: Constant means the pain never goes away completely; most serious pain is constant and gets worse over time)     - If intermittent: "How long does it last?" "Do you have pain now?"     (Note: Intermittent  means the pain goes away completely between bouts)     Constant 6. SEVERITY: "How bad is the pain?"  (e.g., Scale 1-10; mild, moderate, or severe)   - MILD (1-3): doesn't interfere with normal activities, area soft and not tender to touch    - MODERATE (4-7): interferes with normal activities or awakens from sleep, abdomen tender to touch    - SEVERE (8-10): excruciating pain, doubled over, unable to do any normal activities      Patient is not with Daughter on the phone, [name] states mom told her she was in a lot of a pain.  7. RECURRENT SYMPTOM: "Have you ever had this type of pelvic pain before?" If Yes, ask: "When was the last time?" and "What happened that time?"      No, patient has a GU History with complications.  8. CAUSE: "What do you think is causing the pelvic pain?"     Patient has been out of medication for a week.  9. RELIEVING/AGGRAVATING FACTORS: "What makes it better or worse?" (e.g., activity/rest, sexual intercourse, voiding, passing stool)     None reportable.  10. OTHER SYMPTOMS: "Has there been any other symptoms?" (e.g., fever, constipation, diarrhea, urine problems, vaginal bleeding, vaginal discharge, or vomiting?"       Unable to urinate  11. PREGNANCY: "Is there any chance you are pregnant?" "When was your last menstrual period?"       No  Answer Assessment - Initial Assessment Questions 1. DRUG NAME: "What medicine do you need to have refilled?"  Calcitrol 2. REFILLS REMAINING: "How many refills are remaining?" (Note: The label on the medicine or pill bottle will show how many refills are remaining. If there are no refills remaining, then a renewal may be needed.)     Unable to answer 3. EXPIRATION DATE: "What is the expiration date?" (Note: The label states when the prescription will expire, and thus can no longer be refilled.)     Unable to answer 4. PRESCRIBING HCP: "Who prescribed it?" Reason: If prescribed by specialist, call should be referred to that  group.     External Provider 5. SYMPTOMS: "Do you have any symptoms?"     Patient is experiencing constant bladder pain and is unable to urinate.  6. PREGNANCY: "Is there any chance that you are pregnant?" "When was your last menstrual period?"     No.  Protocols used: Pelvic Pain - Female-A-AH, Medication Refill and Renewal Call-A-AH

## 2023-03-15 NOTE — Telephone Encounter (Signed)
Mailbox full

## 2023-03-18 ENCOUNTER — Ambulatory Visit: Payer: Self-pay | Admitting: *Deleted

## 2023-03-18 ENCOUNTER — Other Ambulatory Visit: Payer: Self-pay | Admitting: Internal Medicine

## 2023-03-18 DIAGNOSIS — N1831 Chronic kidney disease, stage 3a: Secondary | ICD-10-CM

## 2023-03-18 MED ORDER — CALCITRIOL 0.25 MCG PO CAPS
0.2500 ug | ORAL_CAPSULE | ORAL | 0 refills | Status: AC
Start: 2023-03-18 — End: ?

## 2023-03-18 NOTE — Patient Instructions (Addendum)
Visit Information  Thank you for taking time to visit with me today. Please don't hesitate to contact me if I can be of assistance to you.   Following are the goals we discussed today:   Goals Addressed             This Visit's Progress    obtain CPAP care coordination, increase engagement/compliance - care coordination services       Patient will  obtain a CPAP via [facility]  Patient will start taking her Blood pressure prior to hypertension medicine administration   Interventions Today    Flowsheet Row Most Recent Value  Chronic Disease   Chronic disease during today's visit Atrial Fibrillation (AFib), Other, Hypertension (HTN), Chronic Kidney Disease/End Stage Renal Disease (ESRD)  [hypotension, falls, urinary incontinence,]  General Interventions   General Interventions Discussed/Reviewed General Interventions Reviewed, Sick Day Rules, Doctor Visits, Horticulturist, commercial (DME)  [action plan for hypotension Not taking hypertension medicine if BP low]  Doctor Visits Discussed/Reviewed Doctor Visits Reviewed, PCP, Specialist  Durable Medical Equipment (DME) BP Cuff  PCP/Specialist Visits Compliance with follow-up visit  Education Interventions   Education Provided Provided Education  Provided Verbal Education On Medication, Sick Day Rules, [employer]  [discussed calcitriol, monitoring BP, pending hypertension medicine for hypotension & fall prevention, hydration, monitoring for bleeding, nephrology/urology]  Nutrition Interventions   Nutrition Discussed/Reviewed Nutrition Reviewed, Fluid intake  Pharmacy Interventions   Pharmacy Dicussed/Reviewed Pharmacy Topics Reviewed, Medications and their functions, Affording Medications  Safety Interventions   Safety Discussed/Reviewed Safety Reviewed, Fall Risk, Home Safety  Home Safety Assistive Devices              Our next appointment is by telephone on 04/17/23 at 4:15 pm  Please call the care guide team at  [phone] if you need to cancel or reschedule your appointment.   If you are experiencing a Mental Health or Behavioral Health Crisis or need someone to talk to, please call the Suicide and Crisis Lifeline: 988 call the [place] National Suicide Prevention Lifeline: 202-654-4204 or TTY: 6073326485 TTY 804-264-3092) to talk to a trained counselor call 1-800-273-TALK (toll free, 24 hour hotline) call the [facility]: (727) 758-7230 call 911   No computer access, no preference for copy of AVS    The patient has been provided with contact information for the care management team and has been advised to call with any health related questions or concerns.   Emy Angevine L. Noelle Penner, RN, BSN, [facility]  VBCI Care Management Coordinator  [phone]  Fax: [phone]

## 2023-03-18 NOTE — Telephone Encounter (Signed)
Kimberly advised. 

## 2023-03-18 NOTE — Patient Outreach (Signed)
Care Coordination   Follow Up Visit Note   03/18/2023 Name: [name] MRN: [id number] DOB: 06-05-1945  [name] is a 77 y.o. year old female who sees Anabel Halon, MD for primary care. I spoke with Reed Pandy daughter of [name] by phone today.  What matters to the patients health and wellness today?  Falls related to hypotension episodes   Falls-  a few times in the last 2 weeks   Hypotension- [name] reports the patient is suppose to be checking her blood pressure (BP) at home but does not Her Cardiologist adjusted her medicines but without success in preventing hypotension episodes [name] will encouraged patient to check her BP prior to taking hypertension medicine from preventing her causing the BP to decrease further   [name], grand daughter [name] herself about the low BPs    111/46 was a value prior to seeing the Cardiologist. At the MD office orthostatic vitals completed to include a value of  86/46 Patient was noted to be pale  [name] denies bleeding    Having urinary incontinence   Calcitriol prescription - pcp is requesting further prescriptions be obtained from her urologist [name] will also check with Nephrologist Dr Wolfgang Phoenix  Transportation & someone to hear and explain to patient during office visits- [name] transport the patient to visit and "translates" for her grandmother. [name] has an appointment on 03/20/23 prior to the patients appointment. She hopes to make the pcp appointment in time   Goals Addressed             This Visit's Progress    obtain CPAP care coordination, increase engagement/compliance - care coordination services       Patient will  obtain a CPAP via [facility]  Patient will start taking her Blood pressure prior to hypertension medicine administration   Interventions Today    Flowsheet Row Most Recent Value  Chronic Disease   Chronic disease during today's visit Atrial  Fibrillation (AFib), Other, Hypertension (HTN), Chronic Kidney Disease/End Stage Renal Disease (ESRD)  [hypotension, falls, urinary incontinence,]  General Interventions   General Interventions Discussed/Reviewed General Interventions Reviewed, Sick Day Rules, Doctor Visits, Horticulturist, commercial (DME)  [action plan for hypotension Not taking hypertension medicine if BP low]  Doctor Visits Discussed/Reviewed Doctor Visits Reviewed, PCP, Specialist  Durable Medical Equipment (DME) BP Cuff  PCP/Specialist Visits Compliance with follow-up visit  Education Interventions   Education Provided Provided Education  Provided Verbal Education On Medication, Sick Day Rules, Community Resources  [discussed calcitriol, monitoring BP, pending hypertension medicine for hypotension & fall prevention, hydration, monitoring for bleeding, nephrology/urology]  Nutrition Interventions   Nutrition Discussed/Reviewed Nutrition Reviewed, Fluid intake  Pharmacy Interventions   Pharmacy Dicussed/Reviewed Pharmacy Topics Reviewed, Medications and their functions, Affording Medications  Safety Interventions   Safety Discussed/Reviewed Safety Reviewed, Fall Risk, Home Safety  Home Safety Assistive Devices             Patient Active Problem List   Diagnosis Date Noted   Allergic contact dermatitis 11/15/2022   HFrEF (heart failure with reduced ejection fraction) (HCC) 08/17/2022   Atrial fibrillation (HCC) 07/19/2022   Chronic gout involving toe of right foot without tophus 09/12/2021   Chronic respiratory failure with hypoxia (HCC) 05/17/2021   Morbid obesity (HCC) 03/12/2021   Stage 3 chronic kidney disease (HCC) 12/11/2020   Overactive bladder 08/03/2020   Arthritis of knee 05/13/2020   GAD (generalized anxiety disorder) 02/11/2020   DM2 (diabetes mellitus, type 2) (HCC)  09/29/2019   GERD (gastroesophageal reflux disease) 09/29/2019   Hypothyroidism 09/29/2019   Surgical site infection 06/30/2019    Carotid stenosis, right 11/20/2017   COPD (chronic obstructive pulmonary disease) (HCC) 09/27/2017   OSA (obstructive sleep apnea) 09/24/2017   MDD (major depressive disorder), recurrent episode, moderate (HCC) 11/05/2016   PAD (peripheral artery disease) (HCC) 12/06/2014   Chronic mesenteric ischemia (HCC) 08/12/2012   Chronic diarrhea 03/18/2012   Unintentional weight loss 03/18/2012   Dyspnea on exertion 05/06/2009   MITRAL REGURGITATION 03/18/2009   Mitral valve insufficiency and aortic valve insufficiency 03/18/2009   Tobacco abuse 02/11/2009   HLD (hyperlipidemia) 01/19/2009   Benign essential hypertension 01/19/2009   CAD, NATIVE VESSEL 01/19/2009    SDOH assessments and interventions completed:  No     Care Coordination Interventions:  Yes, provided   Follow up plan: Follow up call scheduled for 04/17/23 1615    Encounter Outcome:  Patient Visit Completed   [name] L. Noelle Penner, RN, BSN, [facility]  VBCI Care Management Coordinator  [phone]  Fax: [phone]

## 2023-03-20 ENCOUNTER — Other Ambulatory Visit: Payer: Self-pay | Admitting: Internal Medicine

## 2023-03-20 ENCOUNTER — Encounter: Payer: Self-pay | Admitting: Internal Medicine

## 2023-03-20 ENCOUNTER — Ambulatory Visit: Payer: Medicare Other | Admitting: Internal Medicine

## 2023-03-20 VITALS — BP 122/70 | HR 81 | Ht 62.0 in | Wt 219.8 lb

## 2023-03-20 DIAGNOSIS — I739 Peripheral vascular disease, unspecified: Secondary | ICD-10-CM

## 2023-03-20 DIAGNOSIS — J449 Chronic obstructive pulmonary disease, unspecified: Secondary | ICD-10-CM

## 2023-03-20 DIAGNOSIS — K219 Gastro-esophageal reflux disease without esophagitis: Secondary | ICD-10-CM

## 2023-03-20 DIAGNOSIS — N1832 Chronic kidney disease, stage 3b: Secondary | ICD-10-CM

## 2023-03-20 DIAGNOSIS — G4733 Obstructive sleep apnea (adult) (pediatric): Secondary | ICD-10-CM

## 2023-03-20 DIAGNOSIS — N760 Acute vaginitis: Secondary | ICD-10-CM

## 2023-03-20 DIAGNOSIS — E039 Hypothyroidism, unspecified: Secondary | ICD-10-CM | POA: Diagnosis not present

## 2023-03-20 DIAGNOSIS — F411 Generalized anxiety disorder: Secondary | ICD-10-CM

## 2023-03-20 DIAGNOSIS — I4821 Permanent atrial fibrillation: Secondary | ICD-10-CM

## 2023-03-20 DIAGNOSIS — E1122 Type 2 diabetes mellitus with diabetic chronic kidney disease: Secondary | ICD-10-CM | POA: Diagnosis not present

## 2023-03-20 DIAGNOSIS — R300 Dysuria: Secondary | ICD-10-CM | POA: Insufficient documentation

## 2023-03-20 DIAGNOSIS — Z23 Encounter for immunization: Secondary | ICD-10-CM | POA: Diagnosis not present

## 2023-03-20 DIAGNOSIS — J9611 Chronic respiratory failure with hypoxia: Secondary | ICD-10-CM

## 2023-03-20 DIAGNOSIS — Z720 Tobacco use: Secondary | ICD-10-CM

## 2023-03-20 DIAGNOSIS — I5020 Unspecified systolic (congestive) heart failure: Secondary | ICD-10-CM | POA: Diagnosis not present

## 2023-03-20 DIAGNOSIS — G4700 Insomnia, unspecified: Secondary | ICD-10-CM

## 2023-03-20 DIAGNOSIS — L304 Erythema intertrigo: Secondary | ICD-10-CM

## 2023-03-20 DIAGNOSIS — F331 Major depressive disorder, recurrent, moderate: Secondary | ICD-10-CM

## 2023-03-20 MED ORDER — PANTOPRAZOLE SODIUM 40 MG PO TBEC: 40.0000 mg | DELAYED_RELEASE_TABLET | Freq: Every day | ORAL | 1 refills | Status: DC

## 2023-03-20 MED ORDER — BUPROPION HCL 75 MG PO TABS: 75.0000 mg | ORAL_TABLET | Freq: Two times a day (BID) | ORAL | 5 refills | Status: DC

## 2023-03-20 MED ORDER — ZOLPIDEM TARTRATE 10 MG PO TABS: 10.0000 mg | ORAL_TABLET | Freq: Every evening | ORAL | 2 refills | Status: DC | PRN

## 2023-03-20 MED ORDER — NYSTATIN 100000 UNIT/GM EX POWD: 1.0000 | Freq: Three times a day (TID) | CUTANEOUS | 0 refills | Status: AC

## 2023-03-20 MED ORDER — FLUCONAZOLE 150 MG PO TABS: 150.0000 mg | ORAL_TABLET | Freq: Once | ORAL | 0 refills | Status: AC

## 2023-03-20 MED ORDER — ALPRAZOLAM 0.5 MG PO TABS: 0.5000 mg | ORAL_TABLET | Freq: Two times a day (BID) | ORAL | 3 refills | Status: DC | PRN

## 2023-03-20 MED ORDER — CLOTRIMAZOLE-BETAMETHASONE 1-0.05 % EX CREA: 1.0000 | TOPICAL_CREAM | Freq: Every day | CUTANEOUS | 0 refills | Status: AC

## 2023-03-20 MED ORDER — CITALOPRAM HYDROBROMIDE 20 MG PO TABS: 20.0000 mg | ORAL_TABLET | Freq: Every day | ORAL | 1 refills | Status: DC

## 2023-03-20 MED ORDER — LEVOTHYROXINE SODIUM 50 MCG PO TABS: 50.0000 ug | ORAL_TABLET | Freq: Every day | ORAL | 1 refills | Status: DC

## 2023-03-20 NOTE — Progress Notes (Signed)
Established Patient Office Visit  Subjective:  Patient ID: [name], female    DOB: Jul 18, 1945  Age: 77 y.o. MRN: [id number]  CC:  Chief Complaint  Patient presents with   Hypertension    Four month follow up     HPI [name] is a 77 y.o. female with past medical history of hypertension, hyperlipidemia, mesenteric ischemia status post right hemicolectomy, coronary artery disease, hypothyroidism, type 2 diabetes mellitus, depression with anxiety, obesity, arthritis, chronic back pain who presents for f/u of her chronic medical conditions.  HTN and A-fib: BP is wnl today, but was having hypotensive spells at home till the last week.  During her cardiology visit, her orthostatic vitals were positive and valsartan was discontinued.  She has been told to take metoprolol only as needed for palpitations. She still reports dizziness with position change.  She still takes metoprolol for A-fib, instead of PRN as advised by Cardiology.  She is on Eliquis.  She reports that she still takes aspirin and Plavix.  She has easy bruising.  Denies any chest pain or palpitations currently.  She has chronic exertional dyspnea.  She takes Imdur 30 mg QD for angina and Lasix as needed for leg swelling.  She has been placed on Farxiga for CHF and CKD.  MDD and GAD: She had anhedonia, insomnia, lack of interest in routine activities and agitation, but is improving slowly. Denies any SI or HI. She was placed on Celexa, which has helped her with MDD. She takes Xanax for anxiety and takes Ambien PRN for insomnia.  She also takes Wellbutrin, mainly for smoking cessation, and has cut down smoking up to 4-5 cigarettes per day.  COPD: She has been using Stiolto and as needed albuterol, but does not use it regularly.  She uses home O2 at nighttime for dyspnea and hypoxia (O2 sat less than 88%). Denies any fever or chills. C/o chronic nasal congestion and postnasal drip.  She has chronic irritation of the  skin and has multiple skin lesions.  She was referred to dermatology, but has not heard from them.  She has itching in the abdominal folds and groin area as well.  She also reports dysuria and urinary hesitancy for the last 2 weeks.  Denies any hematuria.  Denies any fever or chills.   Past Medical History:  Diagnosis Date   Acute ischemic colitis (HCC) 09/11/2005   Anxiety    Arthritis    Atherosclerotic vascular disease    Calcified plaque at the origin of the celiac and SMA   CHF (congestive heart failure) (HCC)    Coronary atherosclerosis of native coronary artery    Mulitvessel LVEF 50-50%, DES circ 1/11, occluded SVG to OM and SVG to diagonal , LVEF 60%   COVID-19 virus infection 11/07/2020   Depression    Diverticulosis of colon    Essential hypertension    Hypothyroidism    Mesenteric ischemia (HCC)    Mitral regurgitation    Mild to moderate   Mixed hyperlipidemia    Myocardial infarction (HCC) 1990   Obesity    Orthostatic hypotension    PAD (peripheral artery disease) (HCC)    PVC's (premature ventricular contractions)    Schizophrenia (HCC)    Sleep apnea    CPAP   TIA (transient ischemic attack) 11/20/2017   Tubular adenoma    Type 2 diabetes mellitus (HCC)    Vascular complications of mesenteric artery     Past Surgical History:  Procedure Laterality  Date   BACK SURGERY     Lumbar spine surgery   Carotid endarectomy Bilateral    CARPAL TUNNEL RELEASE     Bilateral   CATARACT EXTRACTION W/PHACO Right 08/24/2013   Procedure: CATARACT EXTRACTION PHACO AND INTRAOCULAR LENS PLACEMENT (IOC);  Surgeon: Gemma Payor, MD;  Location: [facility];  Service: Ophthalmology;  Laterality: Right;  CDE 8.68   COLONOSCOPY  09/14/2005   Dr. Jacqlyn Larsen colitis   COLONOSCOPY WITH ESOPHAGOGASTRODUODENOSCOPY (EGD)  04/14/2012   Dr. Jena Gauss- EGD= gastric erosions of doubtful clinical significance per bx- chronic inactive gastritis, benign small bowel mucosa. TCS=colonic  diverticulosis, tubular adenoma   CORONARY ARTERY BYPASS GRAFT     LIMA-LAD; SVG-OM; SVG-DX in 1995 [facility]   NECK SURGERY     Cervical laminectomy   PARTIAL HYSTERECTOMY     RIGHT/LEFT HEART CATH AND CORONARY/GRAFT ANGIOGRAPHY N/A 04/12/2021   Procedure: RIGHT/LEFT HEART CATH AND CORONARY/GRAFT ANGIOGRAPHY;  Surgeon: Iran Ouch, MD;  Location: [facility];  Service: Cardiovascular;  Laterality: N/A;   stents x4      Family History  Problem Relation Age of Onset   Alcohol abuse Mother    Alcohol abuse Father    Coronary artery disease Other    Hypertension Other    Crohn's disease Sister 13   Stroke Daughter     Social History   Socioeconomic History   Marital status: Widowed    Spouse name: Not on file   Number of children: 4   Years of education: Not on file   Highest education level: Not on file  Occupational History   Occupation: DISABLED    Employer: UNEMPLOYED  Tobacco Use   Smoking status: Every Day    Current packs/day: 0.50    Average packs/day: 0.5 packs/day for 50.3 years (25.2 ttl pk-yrs)    Types: Cigarettes    Start date: 12/30/2018    Passive exposure: Never   Smokeless tobacco: Never  Vaping Use   Vaping status: Never Used  Substance and Sexual Activity   Alcohol use: Yes    Alcohol/week: 0.0 standard drinks of alcohol    Comment: occasionally    Drug use: No   Sexual activity: Not Currently  Other Topics Concern   Not on file  Social History Narrative   LIves alone   Social Determinants of Health   Financial Resource Strain: Low Risk  (06/05/2022)   Received from [facility], [facility]   Overall Financial Resource Strain (CARDIA)    Difficulty of Paying Living Expenses: Not hard at all  Food Insecurity: No Food Insecurity (06/05/2022)   Received from [facility], [facility]   Hunger Vital Sign    Worried About Running Out of Food in the Last Year: Never true    Ran Out of Food in the Last Year: Never true   Transportation Needs: No Transportation Needs (06/05/2022)   Received from [facility], [facility]   [facility] - Transportation    Lack of Transportation (Medical): No    Lack of Transportation (Non-Medical): No  Physical Activity: Inactive (02/27/2021)   Exercise Vital Sign    Days of Exercise per Week: 0 days    Minutes of Exercise per Session: 0 min  Stress: No Stress Concern Present (02/27/2021)   [employer] of Occupational Health - Occupational Stress Questionnaire    Feeling of Stress : Only a little  Social Connections: Socially Isolated (02/27/2021)   Social Connection and Isolation Panel [  NHANES]    Frequency of Communication with Friends and Family: More than three times a week    Frequency of Social Gatherings with Friends and Family: Never    Attends Religious Services: Never    Database administrator or Organizations: No    Attends Banker Meetings: Never    Marital Status: Widowed  Intimate Partner Violence: Not At Risk (02/27/2021)   Humiliation, Afraid, Rape, and Kick questionnaire    Fear of Current or Ex-Partner: No    Emotionally Abused: No    Physically Abused: No    Sexually Abused: No    Outpatient Medications Prior to Visit  Medication Sig Dispense Refill   apixaban (ELIQUIS) 5 MG TABS tablet TAKE ONE (1) TABLET BY MOUTH TWO (2) TIMES DAILY 60 tablet 5   aspirin EC 81 MG tablet Take 81 mg by mouth at bedtime.     blood glucose meter kit and supplies Dispense based on patient and insurance preference. Use up to four times daily as directed. (FOR ICD-10 E10.9, E11.9). 1 each 0   Blood Pressure Monitoring (OMRON 3 SERIES BP MONITOR) DEVI Use a directed 1 each 1   calcitRIOL (ROCALTROL) 0.25 MCG capsule Take 1 capsule (0.25 mcg total) by mouth 2 (two) times a week. 30 capsule 0   dapagliflozin propanediol (FARXIGA) 5 MG TABS tablet Take 1 tablet (5 mg total) by mouth daily. 30 tablet 6   furosemide (LASIX) 40 MG tablet Take 1 tablet  (40 mg total) by mouth daily. 30 tablet 6   isosorbide mononitrate (IMDUR) 30 MG 24 hr tablet Take 1 tablet (30 mg total) by mouth daily. 30 tablet 6   Melatonin 10 MG TABS Take 10 mg by mouth at bedtime as needed (sleep).     metoprolol tartrate (LOPRESSOR) 25 MG tablet Take 0.5 tablets (12.5 mg total) by mouth 2 (two) times daily as needed (palpitations OR systolic BP (top number) greater than 120). 45 tablet 1   midodrine (PROAMATINE) 2.5 MG tablet Take 1 tablet (2.5 mg total) by mouth 3 (three) times daily as needed (systolic (top number) BP less than 90/60). 90 tablet 1   nitroGLYCERIN (NITROSTAT) 0.4 MG SL tablet Place 1 tablet (0.4 mg total) under the tongue every 5 (five) minutes x 3 doses as needed for chest pain. 25 tablet 3   OneTouch Delica Lancets 33G MISC 4 TIMES DAILY 100 each 5   ONETOUCH VERIO test strip 4 TIMES DAILY 100 each 5   pravastatin (PRAVACHOL) 80 MG tablet TAKE ONE (1) TABLET BY MOUTH EVERY DAY 90 tablet 3   ALPRAZolam (XANAX) 0.5 MG tablet TAKE 1 TABLET BY MOUTH TWICE DAILY AS NEEDED FOR ANXIETY 60 tablet 1   buPROPion (WELLBUTRIN) 75 MG tablet TAKE ONE (1) TABLET BY MOUTH TWO (2) TIMES DAILY 60 tablet 1   citalopram (CELEXA) 20 MG tablet TAKE ONE (1) TABLET BY MOUTH EVERY DAY 90 tablet 1   levothyroxine (SYNTHROID) 50 MCG tablet TAKE 1 TABLET BY MOUTH EVERY MORNING BEFORE BREAKFAST 90 tablet 1   pantoprazole (PROTONIX) 40 MG tablet TAKE ONE TABLET BY MOUTH EVERY MORNING 90 tablet 1   zolpidem (AMBIEN) 10 MG tablet TAKE 1 TABLET BY MOUTH AT BEDTIME AS NEEDED FOR SLEEP 30 tablet 2   albuterol (VENTOLIN HFA) 108 (90 Base) MCG/ACT inhaler Inhale 1 puff into the lungs every 6 (six) hours as needed for wheezing or shortness of breath.     Compression Bandages KIT 1 each by Does  not apply route daily. 1 kit 0   diphenoxylate-atropine (LOMOTIL) 2.5-0.025 MG tablet Take 1 tablet by mouth 2 (two) times daily as needed for diarrhea or loose stools.     fluticasone (FLOVENT HFA)  110 MCG/ACT inhaler Inhale 2 puffs into the lungs 2 (two) times daily.     ipratropium (ATROVENT) 0.03 % nasal spray Place 2 sprays into both nostrils every 12 (twelve) hours. 30 mL 12   ipratropium-albuterol (DUONEB) 0.5-2.5 (3) MG/3ML SOLN Take 3 mLs by nebulization 4 times daily and as needed for shortness of breath or wheezing. 450 mL 11   Tiotropium Bromide-Olodaterol (STIOLTO RESPIMAT) 2.5-2.5 MCG/ACT AERS Inhale 2 puffs into the lungs daily. 4 g 11   sodium chloride flush (NS) 0.9 % injection 3 mL      No facility-administered medications prior to visit.    No Known Allergies  ROS Review of Systems  Constitutional:  Negative for chills and fever.  HENT:  Negative for congestion, sinus pressure and sinus pain.   Eyes:  Negative for pain and discharge.  Respiratory:  Positive for cough and shortness of breath.   Cardiovascular:  Positive for leg swelling. Negative for chest pain and palpitations.  Gastrointestinal:  Positive for nausea. Negative for diarrhea and vomiting.  Genitourinary:  Negative for dysuria and hematuria.  Musculoskeletal:  Positive for arthralgias, back pain, gait problem and joint swelling.  Skin:  Positive for rash.       Bruising over UE and LE  Neurological:  Positive for weakness. Negative for headaches.  Hematological:  Bruises/bleeds easily.  Psychiatric/Behavioral:  Positive for agitation and sleep disturbance. Negative for behavioral problems. The patient is nervous/anxious.       Objective:    Physical Exam Vitals reviewed.  Constitutional:      General: She is not in acute distress.    Appearance: She is obese. She is not diaphoretic.  HENT:     Head: Normocephalic and atraumatic.     Nose: Congestion present.     Mouth/Throat:     Mouth: Mucous membranes are moist.     Pharynx: No posterior oropharyngeal erythema.  Eyes:     General: No scleral icterus.    Extraocular Movements: Extraocular movements intact.  Cardiovascular:     Rate  and Rhythm: Normal rate. Rhythm irregular.     Pulses: Normal pulses.     Heart sounds: Normal heart sounds. No murmur heard. Pulmonary:     Breath sounds: No wheezing or rales.  Musculoskeletal:     Cervical back: Normal range of motion and neck supple. No rigidity or tenderness.     Right lower leg: No edema.     Left lower leg: No edema.  Skin:    General: Skin is warm.     Findings: Bruising (over b/l UE and LE) and rash (Erythema (patient reported) in perineal and groin area) present.     Comments: Multiple whitish skin eruptions over b/l UE and back -likely actinic keratosis  Neurological:     General: No focal deficit present.     Mental Status: She is alert and oriented to person, place, and time.     Sensory: No sensory deficit.     Motor: No weakness.     Gait: Gait abnormal (Likely in the setting of baseline hip pain).  Psychiatric:        Mood and Affect: Mood is anxious.        Behavior: Behavior normal.     BP 122/70 (  BP Location: Left Arm, Patient Position: Sitting, Cuff Size: Large)   Pulse 81   Ht 5\' 2"  (1.575 m)   Wt 219 lb 12.8 oz (99.7 kg)   SpO2 98%   BMI 40.20 kg/m  Wt Readings from Last 3 Encounters:  03/20/23 219 lb 12.8 oz (99.7 kg)  03/07/23 220 lb (99.8 kg)  01/24/23 218 lb 6.4 oz (99.1 kg)    Lab Results  Component Value Date   TSH 2.140 11/15/2022   Lab Results  Component Value Date   WBC 6.9 12/13/2022   HGB 15.5 12/13/2022   HCT 47.2 (H) 12/13/2022   MCV 99 (H) 12/13/2022   PLT 239 12/13/2022   Lab Results  Component Value Date   NA 138 12/13/2022   K 4.5 12/13/2022   CO2 25 12/13/2022   GLUCOSE 147 (H) 12/13/2022   BUN 26 12/13/2022   CREATININE 1.61 (H) 12/13/2022   BILITOT 0.5 11/15/2022   ALKPHOS 61 11/15/2022   AST 17 11/15/2022   ALT 19 11/15/2022   PROT 6.7 11/15/2022   ALBUMIN 4.0 11/15/2022   CALCIUM 9.2 12/13/2022   ANIONGAP 9 03/13/2021   EGFR 33 (L) 12/13/2022   Lab Results  Component Value Date   CHOL  131 11/15/2022   Lab Results  Component Value Date   HDL 55 11/15/2022   Lab Results  Component Value Date   LDLCALC 59 11/15/2022   Lab Results  Component Value Date   TRIG 91 11/15/2022   Lab Results  Component Value Date   CHOLHDL 2.4 11/15/2022   Lab Results  Component Value Date   HGBA1C 6.6 02/19/2023      Assessment & Plan:   Problem List Items Addressed This Visit       Cardiovascular and Mediastinum   PAD (peripheral artery disease) (HCC)    On aspirin, Eliquis and statin Denies claudication symptoms currently      Atrial fibrillation (HCC) - Primary    On Eliquis now, stopped Plavix Advised to ask cardiology for aspirin Currently rate controlled, on metoprolol - needs to take it only PRN for palpitations, as she has hypotensive spells Followed by Cardiology      HFrEF (heart failure with reduced ejection fraction) (HCC)    Last echo (02/22) showed LVEF of 45 to 50% She has chronic leg swelling and orthopnea Takes Furosemide 40 mg QD PRN On Midodrine for hypotension now        Respiratory   COPD (chronic obstructive pulmonary disease) (HCC)    Well-controlled with Stiolto and PRN Albuterol Needs to use maintenance inhalers regularly She would benefit from using nebulizer treatments, has DuoNeb Promethazine DM syrup as needed for cough Needs to cut down -> quit smoking      Chronic respiratory failure with hypoxia (HCC)    Due to underlying COPD Uses Stiolto and as needed albuterol Uses O2 at nighttime, O2 sat drops below 88% at nighttime, new Rx was sent to [facility]        Digestive   GERD (gastroesophageal reflux disease)    Overall well-controlled with pantoprazole 40 mg daily      Relevant Medications   pantoprazole (PROTONIX) 40 MG tablet     Endocrine   DM2 (diabetes mellitus, type 2) with CKD     Lab Results  Component Value Date   HGBA1C 6.1 (H) 11/15/2022   Well controlled On Farxiga for CHF now Avoid  tighter control in her case due to risk of hypoglycemia  related fall Has CKD, followed by Nephrology On statin      Hypothyroidism    Lab Results  Component Value Date   TSH 2.140 11/15/2022   On Levothyroxine 50 mcg - advised to take it regularly Check TSH and free T4 later, adjust dose accordingly      Relevant Medications   levothyroxine (SYNTHROID) 50 MCG tablet     Genitourinary   Vulvovaginitis    She has perineal area itching and groin rash Has recently started Farxiga Fluconazole 150 mg single dose prescribed Lotrisone cream for intertrigo Nystatin powder for ppx       Relevant Medications   fluconazole (DIFLUCAN) 150 MG tablet     Other   Tobacco abuse    Smokes about 1 pack/week  Asked about quitting: confirms that he/she currently smokes cigarettes Advise to quit smoking: Educated about QUITTING to reduce the risk of cancer, cardio and cerebrovascular disease. Assess willingness: Unwilling to quit at this time, but is working on cutting back. Assist with counseling and pharmacotherapy: Counseled for 5 minutes and literature provided. Started Wellbutrin. Arrange for follow up: follow up in 3 months and continue to offer help.      Relevant Medications   buPROPion (WELLBUTRIN) 75 MG tablet   MDD (major depressive disorder), recurrent episode, moderate (HCC)    Flowsheet Row Office Visit from 03/20/2023 in [facility]  PHQ-9 Total Score 17      Uncontrolled, but improving with Celexa and Wellbutrin Takes Ambien as needed for insomnia      Relevant Medications   buPROPion (WELLBUTRIN) 75 MG tablet   citalopram (CELEXA) 20 MG tablet   ALPRAZolam (XANAX) 0.5 MG tablet   GAD (generalized anxiety disorder)    Takes Xanax, sometimes twice daily - would avoid increasing dose of Xanax for now as she is also on Ambien On Celexa for MDD and anxiety On Wellbutrin for smoking cessation, plan to discontinue later Patient also takes  Ambien for insomnia. She lives alone and could be a reason for her anxiety as well      Relevant Medications   buPROPion (WELLBUTRIN) 75 MG tablet   citalopram (CELEXA) 20 MG tablet   ALPRAZolam (XANAX) 0.5 MG tablet   Dysuria    Check UA with reflex culture      Relevant Orders   UA/M w/rflx Culture, Routine   Other Visit Diagnoses     Intertrigo       Relevant Medications   clotrimazole-betamethasone (LOTRISONE) cream   nystatin (MYCOSTATIN/NYSTOP) powder   Insomnia, unspecified type       Relevant Medications   zolpidem (AMBIEN) 10 MG tablet   Encounter for immunization       Relevant Orders   Flu Vaccine Trivalent High Dose (Fluad) (Completed)       Meds ordered this encounter  Medications   fluconazole (DIFLUCAN) 150 MG tablet    Sig: Take 1 tablet (150 mg total) by mouth once for 1 dose.    Dispense:  1 tablet    Refill:  0   clotrimazole-betamethasone (LOTRISONE) cream    Sig: Apply 1 Application topically daily.    Dispense:  30 g    Refill:  0   nystatin (MYCOSTATIN/NYSTOP) powder    Sig: Apply 1 Application topically 3 (three) times daily.    Dispense:  15 g    Refill:  0   buPROPion (WELLBUTRIN) 75 MG tablet    Sig: Take 1 tablet (75 mg total) by  mouth 2 (two) times daily.    Dispense:  60 tablet    Refill:  5   citalopram (CELEXA) 20 MG tablet    Sig: Take 1 tablet (20 mg total) by mouth daily.    Dispense:  90 tablet    Refill:  1   levothyroxine (SYNTHROID) 50 MCG tablet    Sig: Take 1 tablet (50 mcg total) by mouth daily before breakfast.    Dispense:  90 tablet    Refill:  1   pantoprazole (PROTONIX) 40 MG tablet    Sig: Take 1 tablet (40 mg total) by mouth daily.    Dispense:  90 tablet    Refill:  1   ALPRAZolam (XANAX) 0.5 MG tablet    Sig: Take 1 tablet (0.5 mg total) by mouth 2 (two) times daily as needed. for anxiety    Dispense:  60 tablet    Refill:  3   zolpidem (AMBIEN) 10 MG tablet    Sig: Take 1 tablet (10 mg total) by  mouth at bedtime as needed. for sleep    Dispense:  30 tablet    Refill:  2    Follow-up: Return in about 4 months (around 07/18/2023) for HTN and A fib.    Anabel Halon, MD

## 2023-03-20 NOTE — Assessment & Plan Note (Addendum)
On Eliquis now, stopped Plavix Advised to ask cardiology for aspirin Currently rate controlled, on metoprolol - needs to take it only PRN for palpitations, as she has hypotensive spells Followed by Cardiology

## 2023-03-20 NOTE — Assessment & Plan Note (Signed)
Smokes about 1 pack/week  Asked about quitting: confirms that he/she currently smokes cigarettes Advise to quit smoking: Educated about QUITTING to reduce the risk of cancer, cardio and cerebrovascular disease. Assess willingness: Unwilling to quit at this time, but is working on cutting back. Assist with counseling and pharmacotherapy: Counseled for 5 minutes and literature provided. Started Wellbutrin. Arrange for follow up: follow up in 3 months and continue to offer help.

## 2023-03-20 NOTE — Assessment & Plan Note (Addendum)
  Lab Results  Component Value Date   HGBA1C 6.1 (H) 11/15/2022   Well controlled On Farxiga for CHF now Avoid tighter control in her case due to risk of hypoglycemia related fall Has CKD, followed by Nephrology On statin

## 2023-03-20 NOTE — Assessment & Plan Note (Addendum)
Lab Results  Component Value Date   TSH 2.140 11/15/2022   On Levothyroxine 50 mcg - advised to take it regularly Check TSH and free T4 later, adjust dose accordingly

## 2023-03-20 NOTE — Assessment & Plan Note (Signed)
 Check UA with reflex culture.

## 2023-03-20 NOTE — Assessment & Plan Note (Addendum)
Last echo (02/22) showed LVEF of 45 to 50% She has chronic leg swelling and orthopnea Takes Furosemide 40 mg QD PRN On Midodrine for hypotension now

## 2023-03-20 NOTE — Assessment & Plan Note (Addendum)
She has perineal area itching and groin rash Has recently started Farxiga Fluconazole 150 mg single dose prescribed Lotrisone cream for intertrigo Nystatin powder for ppx

## 2023-03-20 NOTE — Assessment & Plan Note (Deleted)
BP Readings from Last 1 Encounters:  03/20/23 122/70   Overall very tightly controlled considering her age Had decreased dose of Imdur to 30 mg every day  DCed valsartan due to hypotension Counseled for compliance with the medications Advised low salt diet

## 2023-03-20 NOTE — Patient Instructions (Signed)
Please start taking Midodrine 2.5 mg once daily and take another dose as needed for blood pressure less than 90/60.  Please take Metoprolol only as needed for palpitations as advised by Cardiology.  Please continue to take medications as prescribed.  Please continue to follow low carb diet and ambulate as tolerated.

## 2023-03-20 NOTE — Assessment & Plan Note (Signed)
Well-controlled with Stiolto and PRN Albuterol Needs to use maintenance inhalers regularly She would benefit from using nebulizer treatments, has DuoNeb Promethazine DM syrup as needed for cough Needs to cut down -> quit smoking

## 2023-03-20 NOTE — Assessment & Plan Note (Signed)
Overall well-controlled with pantoprazole 40 mg daily

## 2023-03-20 NOTE — Assessment & Plan Note (Signed)
Due to underlying COPD Uses Stiolto and as needed albuterol Uses O2 at nighttime, O2 sat drops below 88% at nighttime, new Rx was sent to [facility]

## 2023-03-20 NOTE — Assessment & Plan Note (Signed)
On aspirin, Eliquis and statin Denies claudication symptoms currently

## 2023-03-20 NOTE — Assessment & Plan Note (Addendum)
Takes Xanax, sometimes twice daily - would avoid increasing dose of Xanax for now as she is also on Ambien On Celexa for MDD and anxiety On Wellbutrin for smoking cessation, plan to discontinue later Patient also takes Ambien for insomnia. She lives alone and could be a reason for her anxiety as well

## 2023-03-20 NOTE — Assessment & Plan Note (Signed)
Flowsheet Row Office Visit from 03/20/2023 in [facility]  PHQ-9 Total Score 17      Uncontrolled, but improving with Celexa and Wellbutrin Takes Ambien as needed for insomnia

## 2023-03-21 LAB — UA/M W/RFLX CULTURE, ROUTINE
Bilirubin, UA: NEGATIVE
Ketones, UA: NEGATIVE
Leukocytes,UA: NEGATIVE
Nitrite, UA: NEGATIVE
Protein,UA: NEGATIVE
RBC, UA: NEGATIVE
Specific Gravity, UA: 1.008 (ref 1.005–1.030)
Urobilinogen, Ur: 0.2 mg/dL (ref 0.2–1.0)
pH, UA: 6.5 (ref 5.0–7.5)

## 2023-03-21 LAB — MICROSCOPIC EXAMINATION
Bacteria, UA: NONE SEEN
Casts: NONE SEEN /[LPF]
Epithelial Cells (non renal): NONE SEEN /[HPF] (ref 0–10)
RBC, Urine: NONE SEEN /[HPF] (ref 0–2)
WBC, UA: NONE SEEN /[HPF] (ref 0–5)

## 2023-03-27 ENCOUNTER — Encounter: Payer: Self-pay | Admitting: Nurse Practitioner

## 2023-03-27 ENCOUNTER — Ambulatory Visit: Payer: Medicare Other | Attending: Nurse Practitioner | Admitting: Nurse Practitioner

## 2023-03-27 VITALS — BP 110/60 | HR 80 | Ht 63.0 in | Wt 218.0 lb

## 2023-03-27 DIAGNOSIS — I951 Orthostatic hypotension: Secondary | ICD-10-CM | POA: Diagnosis not present

## 2023-03-27 DIAGNOSIS — I2510 Atherosclerotic heart disease of native coronary artery without angina pectoris: Secondary | ICD-10-CM | POA: Diagnosis not present

## 2023-03-27 DIAGNOSIS — I739 Peripheral vascular disease, unspecified: Secondary | ICD-10-CM

## 2023-03-27 DIAGNOSIS — I10 Essential (primary) hypertension: Secondary | ICD-10-CM | POA: Diagnosis not present

## 2023-03-27 DIAGNOSIS — I9789 Other postprocedural complications and disorders of the circulatory system, not elsewhere classified: Secondary | ICD-10-CM | POA: Diagnosis not present

## 2023-03-27 DIAGNOSIS — Z720 Tobacco use: Secondary | ICD-10-CM

## 2023-03-27 DIAGNOSIS — I5022 Chronic systolic (congestive) heart failure: Secondary | ICD-10-CM | POA: Diagnosis not present

## 2023-03-27 DIAGNOSIS — I484 Atypical atrial flutter: Secondary | ICD-10-CM | POA: Diagnosis not present

## 2023-03-27 DIAGNOSIS — K559 Vascular disorder of intestine, unspecified: Secondary | ICD-10-CM

## 2023-03-27 DIAGNOSIS — I4891 Unspecified atrial fibrillation: Secondary | ICD-10-CM

## 2023-03-27 DIAGNOSIS — E785 Hyperlipidemia, unspecified: Secondary | ICD-10-CM

## 2023-03-27 DIAGNOSIS — I38 Endocarditis, valve unspecified: Secondary | ICD-10-CM

## 2023-03-27 DIAGNOSIS — I25118 Atherosclerotic heart disease of native coronary artery with other forms of angina pectoris: Secondary | ICD-10-CM

## 2023-03-27 DIAGNOSIS — N183 Chronic kidney disease, stage 3 unspecified: Secondary | ICD-10-CM

## 2023-03-27 DIAGNOSIS — I77819 Aortic ectasia, unspecified site: Secondary | ICD-10-CM

## 2023-03-27 DIAGNOSIS — R001 Bradycardia, unspecified: Secondary | ICD-10-CM | POA: Diagnosis not present

## 2023-03-27 DIAGNOSIS — R42 Dizziness and giddiness: Secondary | ICD-10-CM

## 2023-03-27 DIAGNOSIS — R600 Localized edema: Secondary | ICD-10-CM

## 2023-03-27 MED ORDER — ISOSORBIDE MONONITRATE ER 30 MG PO TB24: 15.0000 mg | ORAL_TABLET | Freq: Every day | ORAL | 5 refills | Status: DC

## 2023-03-27 MED ORDER — MIDODRINE HCL 2.5 MG PO TABS: 2.5000 mg | ORAL_TABLET | Freq: Two times a day (BID) | ORAL | 1 refills | Status: DC

## 2023-03-27 NOTE — Patient Instructions (Addendum)
Medication Instructions:  Your physician has recommended you make the following change in your medication:  Please Stop Metoprolol  Please reduce IMDUR to 15 Mg daily  Please Increase Midodrine to 2.5 Mg daily for 1 week, and after 1 week if symptoms do not improve please Increase to 2.5 Mg twice daily   Labwork: None   Testing/Procedures: Your physician has requested that you have a carotid duplex. This test is an ultrasound of the carotid arteries in your neck. It looks at blood flow through these arteries that supply the brain with blood. Allow one hour for this exam. There are no restrictions or special instructions.  Follow-Up: Your physician recommends that you schedule a follow-up appointment in: 4-6 weeks   Any Other Special Instructions Will Be Listed Below (If Applicable).  If you need a refill on your cardiac medications before your next appointment, please call your pharmacy.

## 2023-03-27 NOTE — Progress Notes (Signed)
Office Visit    Patient Name: [name] Date of Encounter: 03/27/2023 PCP:  Anabel Halon, MD [facility]  Cardiologist:  Nona Dell, MD  Advanced Practice Provider:  No care team member to display Electrophysiologist:  None   Chief Complaint and HPI    [name] is a 77 y.o. female with a hx of atypical A-flutter, CAD, s/p CABG in 1995, history of past stenting, hypertension, mixed hyperlipidemia, HFmrEF, OSA on CPAP, schizophrenia, type 2 diabetes, history of TIA, postop A-fib, and history of mesenteric ischemia, presents today for follow-up.   Previous CV history includes CABG in 1995 (LIMA-LAD, SVG-diagonal, SVG-OM, and SVG-PDA).  Underwent cardiac catheterization in 2011, received DES to circumflex.  Cardiac catheterization in 2022 revealed occluded grafts, patency of LIMA-LAD.  Was noted to have moderate in-stent restenosis in circumflex/OM distribution, was recommended to be medically managed.  Myoview in 2022 revealed normal EF.  Was evaluated at [facility] in February 2024 for near syncope.  She had this experience after taking NTG.  Due to her mild orthostasis, it was felt that she was somewhat dehydrated and was given IV fluids.  CT of the head negative.  Antihypertensive regimen was decreased.  Last seen by Dr. Diona Browner on July 16, 2022.  She noted feeling better, dizziness improved.  She did note feeling weak with low stamina, this had been a chronic issue.  EKG in office revealed A-fib with nonspecific ST changes.  CHA2DS2-VASc score was found to be 7, discussion of anticoagulation was discussed.  Plavix was stopped and replaced with Eliquis 5 mg twice daily.  12/10/2022 - Today she presented for follow-up with her granddaughter. Pt is a poor historian, and there appeared to be some compliance issues with her medications. She was still taking Plavix in addition to Aspirin and Eliquis. She did not start [place] as she did not  want this to cause a yeast infection. Weight was up from last OV. Her dizziness had improved. Denied any chest pain, did admit to NYHA class II-III symptoms. Was requesting to change fluid pill, said she was not able to tolerate Torsemide as this was causing increased frequency/urgency, follows Dr. Wolfgang Phoenix and noted upcoming labs arranged. Denied any recent palpitations, syncope, presyncope, dizziness, orthopnea, PND, swelling, acute bleeding, or claudication.  Medications were adjusted.  Patient politely deferred lab work to Dr. Wolfgang Phoenix.   01/24/2023 -presents for follow-up with her granddaughter who assists her to her doctors appointments.  Poor historian and has forgetfulness at times.  Says she believes her shortness of breath appears to be worse from last office visit, weight is down 4 pounds per our records from last office visit.  Says her shortness of breath causes her to take breaks, sit down.  Says she feels like she is losing her balance.  She feels as though she is tired feels like she is giving out.  Reports an episode of chest pressure located along the left side at rest, says she took NTG, but unable to recall episode. Granddaughter questions patient's report at times.  Patient also believes that her symptoms are due to stress, currently has a relative living in her house that is adding to a stressful situation. Denies any abuse, SI/HI.  Denies any palpitations, syncope, presyncope, dizziness, orthopnea, PND, swelling or significant weight changes, acute bleeding, or claudication.  03/07/2023 - Presents for follow-up with her granddaughter. Admits to dizziness. Says she had a near fall recently, denies any acute injuries. Pt  is a difficult historian due to history of forgetfulness, but appears she has not had any episodes of syncope, but admits to near syncope. Denies any chest pain, but admits to left shoulder pain that she attributes to how she has been sitting in chair at home and scrolling on  her tablet. Denies any shortness of breath, palpitations, syncope, orthopnea, PND, swelling or significant weight changes, acute bleeding, or claudication.  03/27/2023 -she presents today for follow-up with her granddaughter.  She denies any falls since I last saw her in the office.  She says her dizziness remains the same.  Her PCP has recommended that she takes midodrine 2.5 mg daily, granddaughter wants to know if carotid Dopplers need to be done to evaluate her dizziness. Denies any chest pain, shortness of breath, palpitations, syncope, presyncope, orthopnea, PND, swelling or significant weight changes, acute bleeding, or claudication.  ROS: Negative.  See HPI.  EKGs/Labs/Other Studies Reviewed:   The following studies were reviewed today:   EKG: EKG is not ordered today.      Echo 10/2022:  1. Left ventricular ejection fraction, by estimation, is 45 to 50%. The  left ventricle has mildly decreased function. The left ventricle  demonstrates regional wall motion abnormalities (see scoring  diagram/findings for description). There is mild left  ventricular hypertrophy. Left ventricular diastolic parameters are  indeterminate.   2. Right ventricular systolic function is low normal. The right  ventricular size is normal. There is normal pulmonary artery systolic  pressure. The estimated right ventricular systolic pressure is 31.8 mmHg.   3. The mitral valve is degenerative. Mild to moderate mitral valve  regurgitation.   4. Tricuspid valve regurgitation is moderate.   5. The aortic valve is tricuspid. Aortic valve regurgitation is not  visualized. Aortic valve sclerosis is present, with no evidence of aortic  valve stenosis.   6. Aortic dilatation noted. There is mild dilatation of the ascending  aorta, measuring 41 mm.   7. The inferior vena cava is dilated in size with >50% respiratory  variability, suggesting right atrial pressure of 8 mmHg.   Comparison(s): Prior images  reviewed side by side. LVEF stable in 45-50%  range. Normal estimated RVSP. Mild to moderate mitral and moderate  tricuspid regurgitation.  Right and left heart cath 03/2021:   Ost LM to Mid LM lesion is 40% stenosed.   1st Mrg lesion is 50% stenosed.   Prox Cx lesion is 50% stenosed.   Ost LAD to Prox LAD lesion is 90% stenosed.   Prox Cx to Mid Cx lesion is 99% stenosed.   Prox RCA to Mid RCA lesion is 100% stenosed.   Prox RCA lesion is 80% stenosed.   Origin lesion is 100% stenosed.   Origin to Prox Graft lesion is 100% stenosed.   LIMA graft was visualized by angiography and is normal in caliber.   1.  Severe underlying three-vessel coronary artery disease with patent LIMA to LAD.  Chronically occluded SVG to OM and SVG to diagonal.  The SVG to right PDA which was patent on most recent angiography in 2011 is now occluded.  Left circumflex stent from the proximal portion extending into OM1 is patent with moderate in-stent restenosis and subtotal occlusion of the AV groove left circumflex which was present before. 2.  Right heart catheterization showed high normal filling pressures, no pulmonary hypertension and normal cardiac output.   Recommendations: The SVG to right PDA is now occluded which is new but the RCA has good  left-to-right collaterals.  The AV groove left circumflex was subtotally occluded before and jailed by previous stent.  This could not be crossed with a wire before. Recommend continuing medical therapy.  Cardiac monitor 12/2020:  ZIO XT reviewed. 6 days, 8 hours analyzed. Predominant rhythm is sinus with prolonged PR interval and heart rate ranging from 39 bpm (early morning) up to 116 bpm and average heart rate 59 bpm. Rare PACs including couplets and triplets were noted representing less than 1% total beats. Rare PVCs including couplets and triplets were noted representing less than 1% total beats. There were also limited episodes of ventricular bigeminy and trigeminy.  Multiple episodes of SVT were noted, these were generally brief with the longest lasting only 13 beats, nonsustained. There were no ventricular arrhythmias. No significant pauses.  Myoview 12/2020:    The study is low risk.   No ST deviation was noted. The ECG was negative for ischemia.   LV perfusion is abnormal. Defect 1: There is a small defect with mild reduction in uptake present in the apical anterior location(s) that is fixed. There is normal wall motion in the defect area. Consistent with artifact caused by breast attenuation. Defect 2: There is a small defect with mild reduction in uptake present in the mid to basal inferior location(s) that is partially reversible. There is normal wall motion in the defect area. Consistent with ischemia.   Left ventricular function is normal. Nuclear stress EF: 61 %.   Breast attenuation artifact was present.   Low risk study with evidence of breast attenuation artifact and also mild mid to basal inferior ischemia, LVEF normal at 61%.  Carotid duplex 12/2020:  Summary:  Right Carotid: Velocities in the right ICA are consistent with a 1-39%  stenosis. The ECA appears >50% stenosed.   Left Carotid: Velocities in the left ICA are consistent with a 1-39%  stenosis.   Vertebrals: Left vertebral artery demonstrates antegrade flow. Right  atypical antegrade.  Subclavians: Right subclavian artery flow was disturbed. Normal flow hemodynamics were seen in the left subclavian artery.  Echo 05/2020:  IMPRESSIONS    1. Abnormal septal motion distal septal apical mid and basal inferior  wall hypokinesis . Left ventricular ejection fraction, by estimation, is  45 to 50%. The left ventricle has mildly decreased function. The left  ventricle has no regional wall motion  abnormalities. The left ventricular internal cavity size was mildly  dilated. There is moderate left ventricular hypertrophy. Left ventricular  diastolic parameters are consistent with Grade I  diastolic dysfunction  (impaired relaxation).   2. Right ventricular systolic function is normal. The right ventricular  size is normal.   3. Left atrial size was mildly dilated.   4. The mitral valve is degenerative. Mild mitral valve regurgitation. No  evidence of mitral stenosis.   5. The aortic valve is tricuspid. Aortic valve regurgitation is not  visualized. Mild aortic valve sclerosis is present, with no evidence of  aortic valve stenosis.   6. The inferior vena cava is normal in size with greater than 50%  respiratory variability, suggesting right atrial pressure of 3 mmHg.   Risk Assessment/Calculations:  CHA2DS2-VASc score is 7.   The 10-year ASCVD risk score (Arnett DK, et al., 2019) is: 52.1%   Values used to calculate the score:     Age: 73 years     Sex: Female     Is Non-Hispanic African American: No     Diabetic: Yes     Tobacco smoker:  Yes     Systolic Blood Pressure: 110 mmHg     Is BP treated: Yes     HDL Cholesterol: 55 mg/dL     Total Cholesterol: 131 mg/dL  Review of Systems    All other systems reviewed and are otherwise negative except as noted above.  Physical Exam    VS:  BP 110/60   Pulse 80   Ht 5\' 3"  (1.6 m)   Wt 218 lb (98.9 kg)   SpO2 96%   BMI 38.62 kg/m  , BMI Body mass index is 38.62 kg/m.  Wt Readings from Last 3 Encounters:  03/27/23 218 lb (98.9 kg)  03/20/23 219 lb 12.8 oz (99.7 kg)  03/07/23 220 lb (99.8 kg)    GEN: Obese, 77 y.o. female in no acute distress. HEENT: normal. Neck: Supple, no JVD, carotid bruits, or masses. Cardiac: S1/S2, irregularly irregular rhythm, no murmurs, rubs, or gallops. No clubbing, cyanosis, nonpitting edema to BLE.  Radials/PT 2+ and equal bilaterally.  Respiratory:  Respirations regular and unlabored, clear to auscultation bilaterally. MS: No deformity or atrophy. Skin: Warm and dry, no rash. Neuro:  Strength and sensation are intact. Psych: Normal affect.  Assessment & Plan     Orthostatic hypotension, dizziness History and physical exam have been positive for orthostatic hypotension and symptomatic during orthostatics at previous office visit.  Denies any syncope or falls since last office visit.  Instructed to stop metoprolol and reduce isosorbide mononitrate to 50 mg daily.  She was also instructed to take midodrine 2.5 mg daily for 1 week, and was recommended if symptoms do not improve after 1 week to increase midodrine to 2.5 mg twice daily.  She verbalized understanding.  Updating carotid duplex-see below.  Previously given Rx for Omron cuff sent to requested pharmacy. No other medication changes at this time. Heart healthy diet encouraged. Care and ED precautions discussed.   HFmrEF, leg edema Stage C, NYHA class II-III symptoms. EF 10/2022 45-50%.  Weight is stable.  No signs of JVD or signs of volume overload on exam, difficult to ascertain fluid status d/t patient's BMI, not in acute distress. Medication changes as noted above, no other medication changes beyond that. GDMT limited d/t BP and history of orthostatic hypotension.  Low sodium diet, fluid restriction <2L, and daily weights encouraged. Educated to contact our office for weight gain of 2 lbs overnight or 5 lbs in one week. ED precautions discussed.  Nonpitting BLE on exam. Previously prescribed Rx for compression stockings.  Multivessel CAD, s/p CABG in 1995 Denies any chest pain.  Past EKG reassuring.  No indication for ischemic evaluation.  NST in 2022 was low risk. Continue Aspirin, and pravastatin. Medication changes as noted above.  Heart healthy diet encouraged. ED precautions discussed.   HLD, hx of mesenteric ischemia, PAD She is status post right hemicolectomy and right iliac to SMA bypass performed at [facility] in 2021.  Past carotid duplex in 2021 revealed mild to moderate ICA atherosclerosis with atypical right vertebral artery waveform.  Will update carotid duplex at this time due to her history of  orthostatic hypotension/dizziness-see above.  Past LDL 59. Continue current medication regimen. Heart healthy diet and regular cardiovascular exercise encouraged.  Care and ED precautions discussed.  HTN Blood pressure soft today and previous orthostatics positive. Adjusting medications as noted above. Discussed to monitor BP at home at least 2 hours after medications and sitting for 5-10 minutes. Heart healthy diet encouraged.   Atypical A-flutter, post-op A-fib, bradycardia Does  have previous hx of post-op A-fib and noted to have atypical A-flutter on ECG at [facility] 05/2022. Denies any tachycardia or palpitations. HR well controlled today.  Will stop metoprolol due to orthostatic hypotension.  Continue Eliquis 5 mg BID, on appropriate dosage and denies any bleeding issues.   6. Valvular insufficiency Past TTE revealed moderate TR, along with mild to moderate MR. Recommend updating Echo in 1-2 years or sooner if clinically indicated, around July 2025.  7. Aortic dilatation Mild dilatation of ascending aorta on TTE, measuring 41 mm. Recommend updating TTE in 1 year for monitoring, around July 2025.   8. CKD stage 3 Most recent labs revealed stable kidney function. Encouraged adequate hydration.  Avoid nephrotoxic agents.  Continue to follow with PCP and Nephrology.  5. Tobacco use Smoking cessation encouraged and discussed.    Disposition: Care and ED precautions discussed. Follow up in 4-6  weeks with Nona Dell, MD or APP.  Signed, Sharlene Dory, NP

## 2023-04-17 ENCOUNTER — Ambulatory Visit: Payer: Self-pay | Admitting: *Deleted

## 2023-04-17 NOTE — Patient Outreach (Signed)
  Care Coordination   04/17/2023 Name: [name] MRN: [id number] DOB: [birth year]   Care Coordination Outreach Attempts:  An unsuccessful outreach was attempted for an appointment today.  Follow Up Plan:  No further outreach attempts will be made at this time. We have been unable to contact the patient to offer or enroll patient in complex care management services.  Encounter Outcome:  No Answer   Care Coordination Interventions:  No, not indicated    Neyla Gauntt L. Noelle Penner, RN, BSN, [facility]  VBCI Care Management Coordinator  [phone]  Fax: [phone]

## 2023-05-02 ENCOUNTER — Other Ambulatory Visit: Payer: Self-pay | Admitting: Nurse Practitioner

## 2023-05-02 ENCOUNTER — Ambulatory Visit: Payer: Medicare Other | Attending: Nurse Practitioner | Admitting: Nurse Practitioner

## 2023-05-02 ENCOUNTER — Encounter: Payer: Self-pay | Admitting: Nurse Practitioner

## 2023-05-02 ENCOUNTER — Ambulatory Visit: Payer: Medicare Other | Attending: Nurse Practitioner

## 2023-05-02 VITALS — BP 128/86 | HR 66 | Ht 63.0 in | Wt 220.2 lb

## 2023-05-02 DIAGNOSIS — I9789 Other postprocedural complications and disorders of the circulatory system, not elsewhere classified: Secondary | ICD-10-CM | POA: Diagnosis not present

## 2023-05-02 DIAGNOSIS — K559 Vascular disorder of intestine, unspecified: Secondary | ICD-10-CM

## 2023-05-02 DIAGNOSIS — I77819 Aortic ectasia, unspecified site: Secondary | ICD-10-CM

## 2023-05-02 DIAGNOSIS — Z720 Tobacco use: Secondary | ICD-10-CM

## 2023-05-02 DIAGNOSIS — R002 Palpitations: Secondary | ICD-10-CM

## 2023-05-02 DIAGNOSIS — E785 Hyperlipidemia, unspecified: Secondary | ICD-10-CM | POA: Diagnosis not present

## 2023-05-02 DIAGNOSIS — I5022 Chronic systolic (congestive) heart failure: Secondary | ICD-10-CM

## 2023-05-02 DIAGNOSIS — N183 Chronic kidney disease, stage 3 unspecified: Secondary | ICD-10-CM

## 2023-05-02 DIAGNOSIS — I4891 Unspecified atrial fibrillation: Secondary | ICD-10-CM

## 2023-05-02 DIAGNOSIS — I951 Orthostatic hypotension: Secondary | ICD-10-CM

## 2023-05-02 DIAGNOSIS — I10 Essential (primary) hypertension: Secondary | ICD-10-CM | POA: Diagnosis not present

## 2023-05-02 DIAGNOSIS — I38 Endocarditis, valve unspecified: Secondary | ICD-10-CM

## 2023-05-02 DIAGNOSIS — I5020 Unspecified systolic (congestive) heart failure: Secondary | ICD-10-CM

## 2023-05-02 DIAGNOSIS — I739 Peripheral vascular disease, unspecified: Secondary | ICD-10-CM

## 2023-05-02 DIAGNOSIS — I484 Atypical atrial flutter: Secondary | ICD-10-CM

## 2023-05-02 DIAGNOSIS — R000 Tachycardia, unspecified: Secondary | ICD-10-CM

## 2023-05-02 DIAGNOSIS — I2510 Atherosclerotic heart disease of native coronary artery without angina pectoris: Secondary | ICD-10-CM

## 2023-05-02 DIAGNOSIS — R0609 Other forms of dyspnea: Secondary | ICD-10-CM

## 2023-05-02 DIAGNOSIS — R600 Localized edema: Secondary | ICD-10-CM

## 2023-05-02 NOTE — Progress Notes (Signed)
 Office Visit    Patient Name: [name] Date of Encounter: 05/02/2023 PCP:  Tobie Suzzane POUR, MD [facility]  Cardiologist:  Jayson Sierras, MD  Advanced Practice Provider:  No care team member to display Electrophysiologist:  None   Chief Complaint and HPI    [name] is a 78 y.o. female with a hx of atypical A-flutter, CAD, s/p CABG in 1995, history of past stenting, hypertension, mixed hyperlipidemia, HFmrEF, OSA on CPAP, schizophrenia, type 2 diabetes, history of TIA, postop A-fib, and history of mesenteric ischemia, presents today for follow-up.   Previous CV history includes CABG in 1995 (LIMA-LAD, SVG-diagonal, SVG-OM, and SVG-PDA).  Underwent cardiac catheterization in 2011, received DES to circumflex.  Cardiac catheterization in 2022 revealed occluded grafts, patency of LIMA-LAD.  Was noted to have moderate in-stent restenosis in circumflex/OM distribution, was recommended to be medically managed.  Myoview  in 2022 revealed normal EF.  Was evaluated at [facility] in February 2024 for near syncope.  She had this experience after taking NTG.  Due to her mild orthostasis, it was felt that she was somewhat dehydrated and was given IV fluids.  CT of the head negative.  Antihypertensive regimen was decreased.  Last seen by Dr. Sierras on July 16, 2022.  She noted feeling better, dizziness improved.  She did note feeling weak with low stamina, this had been a chronic issue.  EKG in office revealed A-fib with nonspecific ST changes.  CHA2DS2-VASc score was found to be 7, discussion of anticoagulation was discussed.  Plavix  was stopped and replaced with Eliquis  5 mg twice daily.  03/27/2023 - Presents today for follow-up with her granddaughter.  She denies any falls since I last saw her in the office.  She says her dizziness remains the same.  Her PCP has recommended that she takes midodrine  2.5 mg daily, granddaughter wants to know if carotid  Dopplers need to be done to evaluate her dizziness. Denies any chest pain, shortness of breath, palpitations, syncope, presyncope, orthopnea, PND, swelling or significant weight changes, acute bleeding, or claudication.  05/02/2023 - Presents to office for follow-up by herself. Doing better since I last saw her. Her dizziness has resolved since starting midodrine  2.5 mg BID. Wants to know if she really needs the carotid duplex study done. Does admit to palpitations/tachcyardia sensation. Says when she does house chores, she gives out, says she recently checked her HR during one of these episodes and found her HR to be 110. Denies any chest pain, syncope, presyncope, dizziness, orthopnea, PND, swelling or significant weight changes, acute bleeding, or claudication. Continues to admit to dependent edema at times and occasional shortness of breath that she attributes partly to her smoking.   ROS: Negative.  See HPI.  EKGs/Labs/Other Studies Reviewed:   The following studies were reviewed today:   EKG: EKG is not ordered today.  Echo 10/2022:  1. Left ventricular ejection fraction, by estimation, is 45 to 50%. The  left ventricle has mildly decreased function. The left ventricle  demonstrates regional wall motion abnormalities (see scoring  diagram/findings for description). There is mild left  ventricular hypertrophy. Left ventricular diastolic parameters are  indeterminate.   2. Right ventricular systolic function is low normal. The right  ventricular size is normal. There is normal pulmonary artery systolic  pressure. The estimated right ventricular systolic pressure is 31.8 mmHg.   3. The mitral valve is degenerative. Mild to moderate mitral valve  regurgitation.   4. Tricuspid  valve regurgitation is moderate.   5. The aortic valve is tricuspid. Aortic valve regurgitation is not  visualized. Aortic valve sclerosis is present, with no evidence of aortic  valve stenosis.   6. Aortic  dilatation noted. There is mild dilatation of the ascending  aorta, measuring 41 mm.   7. The inferior vena cava is dilated in size with >50% respiratory  variability, suggesting right atrial pressure of 8 mmHg.   Comparison(s): Prior images reviewed side by side. LVEF stable in 45-50%  range. Normal estimated RVSP. Mild to moderate mitral and moderate  tricuspid regurgitation.  Right and left heart cath 03/2021:   Ost LM to Mid LM lesion is 40% stenosed.   1st Mrg lesion is 50% stenosed.   Prox Cx lesion is 50% stenosed.   Ost LAD to Prox LAD lesion is 90% stenosed.   Prox Cx to Mid Cx lesion is 99% stenosed.   Prox RCA to Mid RCA lesion is 100% stenosed.   Prox RCA lesion is 80% stenosed.   Origin lesion is 100% stenosed.   Origin to Prox Graft lesion is 100% stenosed.   LIMA graft was visualized by angiography and is normal in caliber.   1.  Severe underlying three-vessel coronary artery disease with patent LIMA to LAD.  Chronically occluded SVG to OM and SVG to diagonal.  The SVG to right PDA which was patent on most recent angiography in 2011 is now occluded.  Left circumflex stent from the proximal portion extending into OM1 is patent with moderate in-stent restenosis and subtotal occlusion of the AV groove left circumflex which was present before. 2.  Right heart catheterization showed high normal filling pressures, no pulmonary hypertension and normal cardiac output.   Recommendations: The SVG to right PDA is now occluded which is new but the RCA has good left-to-right collaterals.  The AV groove left circumflex was subtotally occluded before and jailed by previous stent.  This could not be crossed with a wire before. Recommend continuing medical therapy.  Cardiac monitor 12/2020:  ZIO XT reviewed. 6 days, 8 hours analyzed. Predominant rhythm is sinus with prolonged PR interval and heart rate ranging from 39 bpm (early morning) up to 116 bpm and average heart rate 59 bpm. Rare  PACs including couplets and triplets were noted representing less than 1% total beats. Rare PVCs including couplets and triplets were noted representing less than 1% total beats. There were also limited episodes of ventricular bigeminy and trigeminy. Multiple episodes of SVT were noted, these were generally brief with the longest lasting only 13 beats, nonsustained. There were no ventricular arrhythmias. No significant pauses.  Myoview  12/2020:    The study is low risk.   No ST deviation was noted. The ECG was negative for ischemia.   LV perfusion is abnormal. Defect 1: There is a small defect with mild reduction in uptake present in the apical anterior location(s) that is fixed. There is normal wall motion in the defect area. Consistent with artifact caused by breast attenuation. Defect 2: There is a small defect with mild reduction in uptake present in the mid to basal inferior location(s) that is partially reversible. There is normal wall motion in the defect area. Consistent with ischemia.   Left ventricular function is normal. Nuclear stress EF: 61 %.   Breast attenuation artifact was present.   Low risk study with evidence of breast attenuation artifact and also mild mid to basal inferior ischemia, LVEF normal at 61%.  Carotid duplex 12/2020:  Summary:  Right Carotid: Velocities in the right ICA are consistent with a 1-39%  stenosis. The ECA appears >50% stenosed.   Left Carotid: Velocities in the left ICA are consistent with a 1-39%  stenosis.   Vertebrals: Left vertebral artery demonstrates antegrade flow. Right  atypical antegrade.  Subclavians: Right subclavian artery flow was disturbed. Normal flow hemodynamics were seen in the left subclavian artery.  Echo 05/2020:  IMPRESSIONS    1. Abnormal septal motion distal septal apical mid and basal inferior  wall hypokinesis . Left ventricular ejection fraction, by estimation, is  45 to 50%. The left ventricle has mildly decreased  function. The left  ventricle has no regional wall motion  abnormalities. The left ventricular internal cavity size was mildly  dilated. There is moderate left ventricular hypertrophy. Left ventricular  diastolic parameters are consistent with Grade I diastolic dysfunction  (impaired relaxation).   2. Right ventricular systolic function is normal. The right ventricular  size is normal.   3. Left atrial size was mildly dilated.   4. The mitral valve is degenerative. Mild mitral valve regurgitation. No  evidence of mitral stenosis.   5. The aortic valve is tricuspid. Aortic valve regurgitation is not  visualized. Mild aortic valve sclerosis is present, with no evidence of  aortic valve stenosis.   6. The inferior vena cava is normal in size with greater than 50%  respiratory variability, suggesting right atrial pressure of 3 mmHg.   Risk Assessment/Calculations:  CHA2DS2-VASc score is 7.   The 10-year ASCVD risk score (Arnett DK, et al., 2019) is: 63.2%   Values used to calculate the score:     Age: 52 years     Sex: Female     Is Non-Hispanic African American: No     Diabetic: Yes     Tobacco smoker: Yes     Systolic Blood Pressure: 128 mmHg     Is BP treated: Yes     HDL Cholesterol: 55 mg/dL     Total Cholesterol: 131 mg/dL  Review of Systems    All other systems reviewed and are otherwise negative except as noted above.  Physical Exam    VS:  BP 128/86   Pulse 66   Ht 5' 3 (1.6 m)   Wt 220 lb 3.2 oz (99.9 kg)   SpO2 97%   BMI 39.01 kg/m  , BMI Body mass index is 39.01 kg/m.  Wt Readings from Last 3 Encounters:  05/02/23 220 lb 3.2 oz (99.9 kg)  03/27/23 218 lb (98.9 kg)  03/20/23 219 lb 12.8 oz (99.7 kg)    GEN: Obese, 78 y.o. female in no acute distress. HEENT: normal. Neck: Supple, no JVD, carotid bruits, or masses. Cardiac: S1/S2, irregularly irregular rhythm, no murmurs, rubs, or gallops. No clubbing, cyanosis, nonpitting edema to BLE, improved since  last visit.  Radials/PT 2+ and equal bilaterally.  Respiratory:  Respirations regular and unlabored, clear to auscultation bilaterally. MS: No deformity or atrophy. Skin: Warm and dry, no rash. Neuro:  Strength and sensation are intact. Psych: Normal affect.  Assessment & Plan    Orthostatic hypotension Past positive for orthostatics at previous office visit.  Denies any syncope or falls since last office visit. Dizziness has resolved.  Continue current medication regimen.  Updating carotid duplex-see below.  Previously given Rx for Omron cuff sent to requested pharmacy. Heart healthy diet encouraged. Care and ED precautions discussed.   HFmrEF, DOE, leg edema Stage C, NYHA class II-III symptoms. EF 10/2022  45-50%.  Weight is stable.  No signs of JVD or signs of volume overload on exam, difficult to ascertain fluid status d/t patient's BMI, not in acute distress. DOE etiology multifactorial, attributes this to her smoking habit. GDMT limited d/t BP and history of orthostatic hypotension.  Low sodium diet, fluid restriction <2L, and daily weights encouraged. Educated to contact our office for weight gain of 2 lbs overnight or 5 lbs in one week. ED precautions discussed. Trace, nonpitting edema to BLE - improved from last office visit. Previously prescribed Rx for compression stockings.  Multivessel CAD, s/p CABG in 1995 Denies any chest pain.  Past EKG reassuring.  No indication for ischemic evaluation.  NST in 2022 was low risk. Continue current medication regimen. Heart healthy diet encouraged. ED precautions discussed.   HLD, hx of mesenteric ischemia, PAD She is status post right hemicolectomy and right iliac to SMA bypass performed at [facility] in 2021.  Past carotid duplex in 2021 revealed mild to moderate ICA atherosclerosis with atypical right vertebral artery waveform.  Will update carotid duplex at this time.  Past LDL 59. Continue current medication regimen. Heart healthy diet and regular  cardiovascular exercise encouraged.  Care and ED precautions discussed.  HTN Blood pressure has improved, previous orthostatics positive. Discussed to monitor BP at home at least 2 hours after medications and sitting for 5-10 minutes. Heart healthy diet encouraged. No medication changes at this time.   Atypical A-flutter, post-op A-fib, tachycardia/palpitations Does have previous hx of post-op A-fib and noted to have atypical A-flutter on ECG at [facility] 05/2022. Admits to recent tachycardia/palpitations while doing housework. HR well controlled today.  Will arrange 3 day Zio monitor for further evaluation. Continue Eliquis  5 mg BID, on appropriate dosage and denies any bleeding issues.   6. Valvular insufficiency Past TTE revealed moderate TR, along with mild to moderate MR. Recommend updating Echo in 1-2 years or sooner if clinically indicated, around July 2025.  7. Aortic dilatation Mild dilatation of ascending aorta on TTE, measuring 41 mm. Recommend updating TTE in 1 year for monitoring, around July 2025.   8. CKD stage 3 Most recent labs revealed stable kidney function. Encouraged adequate hydration.  Avoid nephrotoxic agents.  Continue to follow with PCP and Nephrology. Will request most recent labs from Dr. Rachele.   5. Tobacco use Smoking cessation encouraged and discussed.    Disposition: Care and ED precautions discussed. Follow up in 2-3 months with Jayson Sierras, MD or APP.  Signed, Almarie Crate, NP

## 2023-05-02 NOTE — Patient Instructions (Signed)
 Medication Instructions:  Your physician recommends that you continue on your current medications as directed. Please refer to the Current Medication list given to you today.  Labwork: None   Testing/Procedures: Your physician has recommended that you wear a Zio monitor.   This monitor is a medical device that records the heart's electrical activity. Doctors most often use these monitors to diagnose arrhythmias. Arrhythmias are problems with the speed or rhythm of the heartbeat. The monitor is a small device applied to your chest. You can wear one while you do your normal daily activities. While wearing this monitor if you have any symptoms to push the button and record what you felt. Once you have worn this monitor for the period of time provider prescribed (for 3 days), you will return the monitor device in the postage paid box. Once it is returned they will download the data collected and provide us  with a report which the provider will then review and we will call you with those results. Important tips:  Avoid showering during the first 24 hours of wearing the monitor. Avoid excessive sweating to help maximize wear time. Do not submerge the device, no hot tubs, and no swimming pools. Keep any lotions or oils away from the patch. After 24 hours you may shower with the patch on. Take brief showers with your back facing the shower head.  Do not remove patch once it has been placed because that will interrupt data and decrease adhesive wear time. Push the button when you have any symptoms and write down what you were feeling. Once you have completed wearing your monitor, remove and place into box which has postage paid and place in your outgoing mailbox.  If for some reason you have misplaced your box then call our office and we can provide another box and/or mail it off for you.  Follow-Up: Your physician recommends that you schedule a follow-up appointment in: 2-3 months   Any Other Special  Instructions Will Be Listed Below (If Applicable).  If you need a refill on your cardiac medications before your next appointment, please call your pharmacy.

## 2023-05-09 DIAGNOSIS — R002 Palpitations: Secondary | ICD-10-CM | POA: Diagnosis not present

## 2023-05-14 ENCOUNTER — Ambulatory Visit: Payer: Medicare Other | Attending: Nurse Practitioner

## 2023-05-14 DIAGNOSIS — I10 Essential (primary) hypertension: Secondary | ICD-10-CM | POA: Diagnosis not present

## 2023-05-14 DIAGNOSIS — I739 Peripheral vascular disease, unspecified: Secondary | ICD-10-CM | POA: Diagnosis not present

## 2023-05-14 DIAGNOSIS — I6523 Occlusion and stenosis of bilateral carotid arteries: Secondary | ICD-10-CM | POA: Diagnosis not present

## 2023-05-15 ENCOUNTER — Ambulatory Visit: Payer: Medicare Other | Admitting: Urology

## 2023-05-15 VITALS — BP 135/80 | HR 83

## 2023-05-15 DIAGNOSIS — R39198 Other difficulties with micturition: Secondary | ICD-10-CM | POA: Diagnosis not present

## 2023-05-15 DIAGNOSIS — R3914 Feeling of incomplete bladder emptying: Secondary | ICD-10-CM

## 2023-05-15 LAB — URINALYSIS, ROUTINE W REFLEX MICROSCOPIC
Bilirubin, UA: NEGATIVE
Ketones, UA: NEGATIVE
Leukocytes,UA: NEGATIVE
Nitrite, UA: NEGATIVE
Protein,UA: NEGATIVE
RBC, UA: NEGATIVE
Specific Gravity, UA: 1.01 (ref 1.005–1.030)
Urobilinogen, Ur: 0.2 mg/dL (ref 0.2–1.0)
pH, UA: 6 (ref 5.0–7.5)

## 2023-05-15 MED ORDER — ALFUZOSIN HCL ER 10 MG PO TB24: 10.0000 mg | ORAL_TABLET | Freq: Every day | ORAL | 11 refills | Status: DC

## 2023-05-15 MED ORDER — CIPROFLOXACIN HCL 500 MG PO TABS
500.0000 mg | ORAL_TABLET | Freq: Once | ORAL | Status: AC
  Administered 2023-05-15: 500 mg via ORAL

## 2023-05-15 NOTE — Progress Notes (Signed)
   05/15/23  CC: difficulty urinating   HPI: [name] is a 77yo here for cystoscopy for difficulty urinating There were no vitals taken for this visit. NED. A&Ox3.   No respiratory distress   Abd soft, NT, ND Normal external genitalia with patent urethral meatus  Cystoscopy Procedure Note  Patient identification was confirmed, informed consent was obtained, and patient was prepped using Betadine solution.  Lidocaine jelly was administered per urethral meatus.    Procedure: - Flexible cystoscope introduced, without any difficulty.   - Thorough search of the bladder revealed:    normal urethral meatus    normal urothelium    no stones    no ulcers     no tumors    no urethral polyps    no trabeculation  - Ureteral orifices were normal in position and appearance.  Post-Procedure: - Patient tolerated the procedure well  Assessment/ Plan: We will start uroxatral 10mg  qhs   No follow-ups on file.  Wilkie Aye, MD

## 2023-05-21 ENCOUNTER — Encounter: Payer: Self-pay | Admitting: Urology

## 2023-05-21 NOTE — Patient Instructions (Signed)

## 2023-05-27 DIAGNOSIS — J449 Chronic obstructive pulmonary disease, unspecified: Secondary | ICD-10-CM | POA: Diagnosis not present

## 2023-06-07 ENCOUNTER — Other Ambulatory Visit: Payer: Self-pay | Admitting: Internal Medicine

## 2023-06-07 ENCOUNTER — Other Ambulatory Visit: Payer: Self-pay | Admitting: Cardiology

## 2023-06-07 DIAGNOSIS — E039 Hypothyroidism, unspecified: Secondary | ICD-10-CM

## 2023-06-07 MED ORDER — MIDODRINE HCL 2.5 MG PO TABS: 2.5000 mg | ORAL_TABLET | Freq: Two times a day (BID) | ORAL | 1 refills | Status: DC

## 2023-06-07 MED ORDER — NITROGLYCERIN 0.4 MG SL SUBL: 0.4000 mg | SUBLINGUAL_TABLET | SUBLINGUAL | 3 refills | Status: DC | PRN

## 2023-06-09 ENCOUNTER — Other Ambulatory Visit: Payer: Self-pay | Admitting: Nurse Practitioner

## 2023-06-11 ENCOUNTER — Other Ambulatory Visit: Payer: Self-pay

## 2023-06-11 MED ORDER — APIXABAN 5 MG PO TABS: 5.0000 mg | ORAL_TABLET | Freq: Two times a day (BID) | ORAL | 5 refills | Status: DC

## 2023-06-11 NOTE — Telephone Encounter (Signed)
Prescription refill request for Eliquis received. Indication:afib Last office visit:1/25 Scr:1.61  8/24 Age: 78 Weight:99.9  kg  Prescription refilled

## 2023-06-19 ENCOUNTER — Ambulatory Visit: Payer: Self-pay | Admitting: Internal Medicine

## 2023-06-19 ENCOUNTER — Other Ambulatory Visit: Payer: Self-pay | Admitting: Internal Medicine

## 2023-06-19 DIAGNOSIS — F331 Major depressive disorder, recurrent, moderate: Secondary | ICD-10-CM

## 2023-06-19 DIAGNOSIS — E039 Hypothyroidism, unspecified: Secondary | ICD-10-CM

## 2023-06-19 MED ORDER — LEVOTHYROXINE SODIUM 50 MCG PO TABS: 50.0000 ug | ORAL_TABLET | Freq: Every day | ORAL | 3 refills | Status: DC

## 2023-06-19 MED ORDER — CITALOPRAM HYDROBROMIDE 20 MG PO TABS: 20.0000 mg | ORAL_TABLET | Freq: Every day | ORAL | 1 refills | Status: DC

## 2023-06-19 MED ORDER — BUPROPION HCL 75 MG PO TABS: 75.0000 mg | ORAL_TABLET | Freq: Two times a day (BID) | ORAL | 5 refills | Status: DC

## 2023-06-19 NOTE — Telephone Encounter (Signed)
 Copied from CRM [id number]. Topic: Clinical - Prescription Issue >> Jun 19, 2023  2:16 PM Alcus Dad H wrote: Reason for CRM: Patient's granddaughter [name] called back, says cancelled e-script needs to be sent to [facility] and the patient will continue to use [facility] in [place]. She also said the patient would like to start back taking Wellbutrin because it helps her.

## 2023-06-19 NOTE — Telephone Encounter (Signed)
 This RN spoke to patient's granddaughter, [name], on behalf of the patient. Granddaughter stated that patient recently switched some, but not all, of her prescriptions to [facility] pharmacy. Granddaughter stated that some of the patient's medications needed to be refilled. This RN walked the granddaughter through the patient's medication list and advised the granddaughter to call the appropriate pharmacy, as multiple refills were still listed for the medications. The only medication that was out of refills was Rocaltrol. Patient is requesting a refill of this medication. Patient is requesting that all future prescriptiosn be sent to: [facility] - [place], [place] - [address]. Patient is also requesting her provider to prescribe Wellbutrin.   Copied from CRM [id number]. Topic: Clinical - Pink Word Triage >> Jun 19, 2023 11:23 AM [name] wrote: Reason for CRM: Patient granddaughter [phone], she states that the patient is needing her medications she is complete out of anxiety meds and welbutrin: patient's granddaughter stated that she isn't sure if she had any refills, but she also wanted to get back on her Welbutrin. She states that her grandmother is a  nervous wreck because the prescriptions were being handle by [facility] Reason for Disposition  General information question, no triage required and triager able to answer question  Protocols used: Information Only Call - No Triage-A-AH

## 2023-06-20 ENCOUNTER — Other Ambulatory Visit: Payer: Self-pay | Admitting: Nurse Practitioner

## 2023-06-26 ENCOUNTER — Ambulatory Visit: Payer: Medicare Other | Admitting: Urology

## 2023-07-02 ENCOUNTER — Telehealth: Payer: Self-pay

## 2023-07-02 NOTE — Telephone Encounter (Signed)
 Copied from CRM [id number]. Topic: General - Other >> Jul 01, 2023  2:17 PM Shamecia H wrote: Reason for CRM: [employer] called and wants to verify if the patients is a diabetic. Callback number is [id number] and you can use the reference number [id number].

## 2023-07-03 NOTE — Telephone Encounter (Signed)
 Spoke to [facility]

## 2023-07-16 ENCOUNTER — Encounter: Payer: Self-pay | Admitting: Nurse Practitioner

## 2023-07-16 ENCOUNTER — Ambulatory Visit: Payer: Medicare Other | Attending: Nurse Practitioner | Admitting: Nurse Practitioner

## 2023-07-16 VITALS — BP 99/71 | HR 74 | Ht 63.0 in | Wt 219.0 lb

## 2023-07-16 DIAGNOSIS — R079 Chest pain, unspecified: Secondary | ICD-10-CM | POA: Diagnosis not present

## 2023-07-16 DIAGNOSIS — I951 Orthostatic hypotension: Secondary | ICD-10-CM

## 2023-07-16 DIAGNOSIS — R000 Tachycardia, unspecified: Secondary | ICD-10-CM

## 2023-07-16 DIAGNOSIS — I484 Atypical atrial flutter: Secondary | ICD-10-CM

## 2023-07-16 DIAGNOSIS — E785 Hyperlipidemia, unspecified: Secondary | ICD-10-CM | POA: Diagnosis not present

## 2023-07-16 DIAGNOSIS — I77819 Aortic ectasia, unspecified site: Secondary | ICD-10-CM

## 2023-07-16 DIAGNOSIS — I10 Essential (primary) hypertension: Secondary | ICD-10-CM | POA: Diagnosis not present

## 2023-07-16 DIAGNOSIS — I4821 Permanent atrial fibrillation: Secondary | ICD-10-CM

## 2023-07-16 DIAGNOSIS — I5022 Chronic systolic (congestive) heart failure: Secondary | ICD-10-CM

## 2023-07-16 DIAGNOSIS — I739 Peripheral vascular disease, unspecified: Secondary | ICD-10-CM | POA: Diagnosis not present

## 2023-07-16 DIAGNOSIS — K559 Vascular disorder of intestine, unspecified: Secondary | ICD-10-CM

## 2023-07-16 DIAGNOSIS — R0609 Other forms of dyspnea: Secondary | ICD-10-CM | POA: Diagnosis not present

## 2023-07-16 DIAGNOSIS — I2510 Atherosclerotic heart disease of native coronary artery without angina pectoris: Secondary | ICD-10-CM | POA: Diagnosis not present

## 2023-07-16 DIAGNOSIS — I4891 Unspecified atrial fibrillation: Secondary | ICD-10-CM | POA: Diagnosis not present

## 2023-07-16 DIAGNOSIS — N183 Chronic kidney disease, stage 3 unspecified: Secondary | ICD-10-CM

## 2023-07-16 DIAGNOSIS — I38 Endocarditis, valve unspecified: Secondary | ICD-10-CM

## 2023-07-16 DIAGNOSIS — R002 Palpitations: Secondary | ICD-10-CM

## 2023-07-16 DIAGNOSIS — Z720 Tobacco use: Secondary | ICD-10-CM

## 2023-07-16 DIAGNOSIS — I5020 Unspecified systolic (congestive) heart failure: Secondary | ICD-10-CM

## 2023-07-16 MED ORDER — MIDODRINE HCL 2.5 MG PO TABS: 2.5000 mg | ORAL_TABLET | Freq: Three times a day (TID) | ORAL | 1 refills | Status: DC

## 2023-07-16 MED ORDER — FUROSEMIDE 40 MG PO TABS: 40.0000 mg | ORAL_TABLET | Freq: Every day | ORAL | Status: DC | PRN

## 2023-07-16 NOTE — Progress Notes (Unsigned)
 Office Visit    Patient Name: [name] Date of Encounter: 07/16/2023 PCP:  Anabel Halon, MD  [facility]  Cardiologist:  Nona Dell, MD  Advanced Practice Provider:  No care team member to display Electrophysiologist:  None   Chief Complaint and HPI    [name] is a 78 y.o. female with a hx of atypical A-flutter, CAD, s/p CABG in 1995, history of past stenting, hypertension, mixed hyperlipidemia, HFmrEF, OSA on CPAP, schizophrenia, type 2 diabetes, history of TIA, postop A-fib, and history of mesenteric ischemia, presents today for follow-up.   Previous CV history includes CABG in 1995 (LIMA-LAD, SVG-diagonal, SVG-OM, and SVG-PDA).  Underwent cardiac catheterization in 2011, received DES to circumflex.  Cardiac catheterization in 2022 revealed occluded grafts, patency of LIMA-LAD.  Was noted to have moderate in-stent restenosis in circumflex/OM distribution, was recommended to be medically managed.  Myoview in 2022 revealed normal EF.  Was evaluated at [facility] in February 2024 for near syncope.  She had this experience after taking NTG.  Due to her mild orthostasis, it was felt that she was somewhat dehydrated and was given IV fluids.  CT of the head negative.  Antihypertensive regimen was decreased.  Last seen by Dr. Diona Browner on July 16, 2022.  She noted feeling better, dizziness improved.  She did note feeling weak with low stamina, this had been a chronic issue.  EKG in office revealed A-fib with nonspecific ST changes.  CHA2DS2-VASc score was found to be 7, discussion of anticoagulation was discussed.  Plavix was stopped and replaced with Eliquis 5 mg twice daily.  05/02/2023 - Presents to office for follow-up by herself. Doing better since I last saw her. Her dizziness has resolved since starting midodrine 2.5 mg BID. Wants to know if she really needs the carotid duplex study done. Does admit to palpitations/tachcyardia sensation.  Says when she does house chores, she gives out, says she recently checked her HR during one of these episodes and found her HR to be 110. Denies any chest pain, syncope, presyncope, dizziness, orthopnea, PND, swelling or significant weight changes, acute bleeding, or claudication. Continues to admit to dependent edema at times and occasional shortness of breath that she attributes partly to her smoking.   07/16/2023 -presents today for follow-up with her granddaughter.  Patient is not a reliable historian due to forgetfulness at times that has been noticed during patient interview.  Admits to palpitations and symptoms of A-fib, says her heart rate feels like it is racing all the time, says symptoms resolve when she is resting/relaxing.  Does admit to some dizziness and symptoms of presyncope, denies any syncopal episodes.  Admits to some episodes of chest heaviness, says it feels like something is laying on her chest.  Denies any specific triggers and denies any alleviating or aggravating factors.  Difficult for her to describe. Denies any shortness of breath, syncope, orthopnea, PND, swelling or significant weight changes, acute bleeding, or claudication.  ROS: Negative.  See HPI.  EKGs/Labs/Other Studies Reviewed:   The following studies were reviewed today:  EKG:  EKG Interpretation Date/Time:  Tuesday July 16 2023 13:04:41 EDT Ventricular Rate:  95 PR Interval:    QRS Duration:  82 QT Interval:  334 QTC Calculation: 419 R Axis:   65  Text Interpretation: Atrial fibrillation Septal infarct (cited on or before 07-Mar-2023) Marked ST abnormality, possible inferior subendocardial injury When compared with ECG of 07-Mar-2023 16:11, Inverted T waves have replaced nonspecific  T wave abnormality in Inferior leads QT has lengthened Confirmed by Sharlene Dory [phone]) on 07/16/2023 1:08:14 PM   Carotid duplex 05/2023:  Summary:  Right Carotid: Velocities in the right ICA are consistent with a 1-39%   stenosis. Non-hemodynamically significant plaque <50% noted in the  CCA. The ECA appears >50% stenosed.   Left Carotid: Velocities in the left ICA are consistent with a 1-39%  stenosis. Non-hemodynamically significant plaque <50% noted in the  CCA. The ECA appears <50% stenosed.   Vertebrals:  Bilateral vertebral arteries demonstrate antegrade flow.  Subclavians: Right subclavian artery flow was disturbed. Normal flow hemodynamics were seen in the left subclavian artery.   Cardiac monitor 05/2023: ZIO monitor reviewed.  3 days, 1 hours analyzed.   Atrial fibrillation present throughout monitoring with heart rate ranging from 41 bpm up to 166 bpm and average heart rate 81 bpm. There were occasional PVCs representing 1.5% total beats and otherwise rare ventricular couplets. No pauses or high degree heart block.  Echo 10/2022:  1. Left ventricular ejection fraction, by estimation, is 45 to 50%. The  left ventricle has mildly decreased function. The left ventricle  demonstrates regional wall motion abnormalities (see scoring  diagram/findings for description). There is mild left  ventricular hypertrophy. Left ventricular diastolic parameters are  indeterminate.   2. Right ventricular systolic function is low normal. The right  ventricular size is normal. There is normal pulmonary artery systolic  pressure. The estimated right ventricular systolic pressure is 31.8 mmHg.   3. The mitral valve is degenerative. Mild to moderate mitral valve  regurgitation.   4. Tricuspid valve regurgitation is moderate.   5. The aortic valve is tricuspid. Aortic valve regurgitation is not  visualized. Aortic valve sclerosis is present, with no evidence of aortic  valve stenosis.   6. Aortic dilatation noted. There is mild dilatation of the ascending  aorta, measuring 41 mm.   7. The inferior vena cava is dilated in size with >50% respiratory  variability, suggesting right atrial pressure of 8 mmHg.    Comparison(s): Prior images reviewed side by side. LVEF stable in 45-50%  range. Normal estimated RVSP. Mild to moderate mitral and moderate  tricuspid regurgitation.  Right and left heart cath 03/2021:   Ost LM to Mid LM lesion is 40% stenosed.   1st Mrg lesion is 50% stenosed.   Prox Cx lesion is 50% stenosed.   Ost LAD to Prox LAD lesion is 90% stenosed.   Prox Cx to Mid Cx lesion is 99% stenosed.   Prox RCA to Mid RCA lesion is 100% stenosed.   Prox RCA lesion is 80% stenosed.   Origin lesion is 100% stenosed.   Origin to Prox Graft lesion is 100% stenosed.   LIMA graft was visualized by angiography and is normal in caliber.   1.  Severe underlying three-vessel coronary artery disease with patent LIMA to LAD.  Chronically occluded SVG to OM and SVG to diagonal.  The SVG to right PDA which was patent on most recent angiography in 2011 is now occluded.  Left circumflex stent from the proximal portion extending into OM1 is patent with moderate in-stent restenosis and subtotal occlusion of the AV groove left circumflex which was present before. 2.  Right heart catheterization showed high normal filling pressures, no pulmonary hypertension and normal cardiac output.   Recommendations: The SVG to right PDA is now occluded which is new but the RCA has good left-to-right collaterals.  The AV groove left circumflex was  subtotally occluded before and jailed by previous stent.  This could not be crossed with a wire before. Recommend continuing medical therapy.  Cardiac monitor 12/2020:  ZIO XT reviewed. 6 days, 8 hours analyzed. Predominant rhythm is sinus with prolonged PR interval and heart rate ranging from 39 bpm (early morning) up to 116 bpm and average heart rate 59 bpm. Rare PACs including couplets and triplets were noted representing less than 1% total beats. Rare PVCs including couplets and triplets were noted representing less than 1% total beats. There were also limited episodes of  ventricular bigeminy and trigeminy. Multiple episodes of SVT were noted, these were generally brief with the longest lasting only 13 beats, nonsustained. There were no ventricular arrhythmias. No significant pauses.  Myoview 12/2020:    The study is low risk.   No ST deviation was noted. The ECG was negative for ischemia.   LV perfusion is abnormal. Defect 1: There is a small defect with mild reduction in uptake present in the apical anterior location(s) that is fixed. There is normal wall motion in the defect area. Consistent with artifact caused by breast attenuation. Defect 2: There is a small defect with mild reduction in uptake present in the mid to basal inferior location(s) that is partially reversible. There is normal wall motion in the defect area. Consistent with ischemia.   Left ventricular function is normal. Nuclear stress EF: 61 %.   Breast attenuation artifact was present.   Low risk study with evidence of breast attenuation artifact and also mild mid to basal inferior ischemia, LVEF normal at 61%.  Carotid duplex 12/2020:  Summary:  Right Carotid: Velocities in the right ICA are consistent with a 1-39%  stenosis. The ECA appears >50% stenosed.   Left Carotid: Velocities in the left ICA are consistent with a 1-39%  stenosis.   Vertebrals: Left vertebral artery demonstrates antegrade flow. Right  atypical antegrade.  Subclavians: Right subclavian artery flow was disturbed. Normal flow hemodynamics were seen in the left subclavian artery.  Echo 05/2020:  IMPRESSIONS    1. Abnormal septal motion distal septal apical mid and basal inferior  wall hypokinesis . Left ventricular ejection fraction, by estimation, is  45 to 50%. The left ventricle has mildly decreased function. The left  ventricle has no regional wall motion  abnormalities. The left ventricular internal cavity size was mildly  dilated. There is moderate left ventricular hypertrophy. Left ventricular  diastolic  parameters are consistent with Grade I diastolic dysfunction  (impaired relaxation).   2. Right ventricular systolic function is normal. The right ventricular  size is normal.   3. Left atrial size was mildly dilated.   4. The mitral valve is degenerative. Mild mitral valve regurgitation. No  evidence of mitral stenosis.   5. The aortic valve is tricuspid. Aortic valve regurgitation is not  visualized. Mild aortic valve sclerosis is present, with no evidence of  aortic valve stenosis.   6. The inferior vena cava is normal in size with greater than 50%  respiratory variability, suggesting right atrial pressure of 3 mmHg.   Risk Assessment/Calculations:  CHA2DS2-VASc score is 7.   The 10-year ASCVD risk score (Arnett DK, et al., 2019) is: 44.9%   Values used to calculate the score:     Age: 81 years     Sex: Female     Is Non-Hispanic African American: No     Diabetic: Yes     Tobacco smoker: Yes     Systolic Blood Pressure: 99  mmHg     Is BP treated: Yes     HDL Cholesterol: 55 mg/dL     Total Cholesterol: 131 mg/dL  Review of Systems    All other systems reviewed and are otherwise negative except as noted above.  Physical Exam    VS:  BP 99/71 (BP Location: Right Arm, Patient Position: Sitting)   Pulse 74   Ht 5\' 3"  (1.6 m)   Wt 219 lb (99.3 kg)   SpO2 98%   BMI 38.79 kg/m  , BMI Body mass index is 38.79 kg/m.  Wt Readings from Last 3 Encounters:  07/16/23 219 lb (99.3 kg)  05/02/23 220 lb 3.2 oz (99.9 kg)  03/27/23 218 lb (98.9 kg)   GEN: Obese, 78 y.o. female in no acute distress. HEENT: normal. Neck: Supple, no JVD, carotid bruits, or masses. Cardiac: S1/S2, irregularly irregular rhythm, no murmurs, rubs, or gallops. No clubbing, cyanosis, no edema to BLE.  Radials/PT 2+ and equal bilaterally.  Respiratory:  Respirations regular and unlabored, clear to auscultation bilaterally. MS: No deformity or atrophy. Skin: Warm and dry, no rash. Neuro:  Strength and  sensation are intact. Psych: Normal affect.  Assessment & Plan    Orthostatic hypotension Past positive for orthostatics at previous office visit.  Denies any syncope or falls since last office visit. Dizziness has been more noticeable recently. Instructed to increase midodrine to 2.5 mg TID.   Previously given Rx for Omron cuff sent to requested pharmacy. Heart healthy diet encouraged. Care and ED precautions discussed.   HFmrEF, DOE Stage C, NYHA class II-III symptoms. EF 10/2022 45-50%.  Weight is stable.  No signs of JVD or signs of volume overload on exam, difficult to ascertain fluid status d/t patient's BMI, not in acute distress. DOE etiology multifactorial, attributes this to her smoking habit. GDMT limited d/t BP and history of orthostatic hypotension.  Will switch Lasix to as needed daily dosing for leg swelling, worsening shortness of breath, or weight gain.  She verbalized understanding.  Low sodium diet, fluid restriction <2L, and daily weights encouraged. Educated to contact our office for weight gain of 2 lbs overnight or 5 lbs in one week. ED precautions discussed. No edema noted on exam. Previously prescribed Rx for compression stockings.  Multivessel CAD, s/p CABG in 1995, chest pain of uncertain etiology Admits to chest heaviness, pt is a difficult historian regarding this.  Underwent right and left heart cath in 2022 that revealed severe underlying three-vessel CAD, patent LIMA to LAD, chronically occluded SVG to OM and SVG to diagonal.  See heart cath report noted above.  Right heart cath showed high normal filling pressures, no pulmonary hypertension, normal cardiac output.  Was recommended at the time to continue medical therapy.  EKG today shows rate controlled A-fib, ST/T wave abnormalities, no significant change from his previous EKG. Continue current medication regimen. Heart healthy diet encouraged. ED precautions discussed.   HLD, hx of mesenteric ischemia, PAD She is  status post right hemicolectomy and right iliac to SMA bypass performed at [facility] in 2021.  Carotid duplex performed recently revealed mild 1 to 39% stenosis along bilateral ICAs, right subclavian artery flow was disturbed. Past LDL 59. Continue current medication regimen. Heart healthy diet and regular cardiovascular exercise encouraged.  Care and ED precautions discussed.  HTN Blood pressure soft, previous orthostatics positive.  Symptomatic with this.  Discussed to monitor BP at home at least 2 hours after medications and sitting for 5-10 minutes.  Adjusting medication as  noted above.  Heart healthy diet encouraged.   Atypical A-flutter, post-op A-fib, tachycardia/palpitations Does have previous hx of post-op A-fib and noted to have atypical A-flutter on ECG at [facility]. Admits to recent tachycardia/palpitations, says it seems to be racing at times. HR well controlled today.  Continue Eliquis 5 mg BID, on appropriate dosage and denies any bleeding issues.  With her current BP trends and dizziness, requires better BP control prior to rate control.  Once BP is better controlled, plan to start low-dose Toprol or atenolol.  Not a good candidate to start amiodarone at this time.  May need to consider EP evaluation in the future.  6. Valvular insufficiency Past TTE revealed moderate TR, along with mild to moderate MR. Recommend updating Echo in 1-2 years or sooner if clinically indicated, around July 2025.  7. Aortic dilatation Mild dilatation of ascending aorta on TTE, measuring 41 mm. Recommend updating TTE in 1 year for monitoring, around July 2025.   8. CKD stage 3 Most recent labs revealed stable kidney function. Encouraged adequate hydration.  Avoid nephrotoxic agents.  Continue to follow with PCP and Nephrology. Will request most recent labs from Dr. Wolfgang Phoenix.   5. Tobacco use Smoking cessation encouraged and discussed.     Disposition: Care and ED precautions discussed. Follow up  in 3 weeks with Nona Dell, MD or APP.  Signed, Sharlene Dory, NP

## 2023-07-16 NOTE — Patient Instructions (Addendum)
 Medication Instructions:  Your physician has recommended you make the following change in your medication:  Please Reduce Lasix to as needed  Please Increase Midodrine to 3 times daily   Labwork: None   Testing/Procedures: None   Follow-Up: Your physician recommends that you schedule a follow-up appointment in: 3 weeks   Any Other Special Instructions Will Be Listed Below (If Applicable).  If you need a refill on your cardiac medications before your next appointment, please call your pharmacy.

## 2023-07-18 ENCOUNTER — Ambulatory Visit: Payer: Medicare Other | Admitting: Internal Medicine

## 2023-07-18 ENCOUNTER — Other Ambulatory Visit: Payer: Self-pay | Admitting: Internal Medicine

## 2023-07-18 ENCOUNTER — Encounter: Payer: Self-pay | Admitting: Internal Medicine

## 2023-07-18 VITALS — BP 98/70 | HR 62 | Ht 63.0 in | Wt 219.6 lb

## 2023-07-18 DIAGNOSIS — I10 Essential (primary) hypertension: Secondary | ICD-10-CM | POA: Diagnosis not present

## 2023-07-18 DIAGNOSIS — I4821 Permanent atrial fibrillation: Secondary | ICD-10-CM | POA: Diagnosis not present

## 2023-07-18 DIAGNOSIS — K551 Chronic vascular disorders of intestine: Secondary | ICD-10-CM | POA: Diagnosis not present

## 2023-07-18 DIAGNOSIS — I959 Hypotension, unspecified: Secondary | ICD-10-CM | POA: Insufficient documentation

## 2023-07-18 DIAGNOSIS — N2581 Secondary hyperparathyroidism of renal origin: Secondary | ICD-10-CM | POA: Diagnosis not present

## 2023-07-18 DIAGNOSIS — N1832 Chronic kidney disease, stage 3b: Secondary | ICD-10-CM | POA: Diagnosis not present

## 2023-07-18 DIAGNOSIS — E039 Hypothyroidism, unspecified: Secondary | ICD-10-CM | POA: Diagnosis not present

## 2023-07-18 DIAGNOSIS — N1831 Chronic kidney disease, stage 3a: Secondary | ICD-10-CM | POA: Diagnosis not present

## 2023-07-18 DIAGNOSIS — I5020 Unspecified systolic (congestive) heart failure: Secondary | ICD-10-CM | POA: Diagnosis not present

## 2023-07-18 DIAGNOSIS — I739 Peripheral vascular disease, unspecified: Secondary | ICD-10-CM

## 2023-07-18 DIAGNOSIS — I952 Hypotension due to drugs: Secondary | ICD-10-CM | POA: Diagnosis not present

## 2023-07-18 DIAGNOSIS — E1122 Type 2 diabetes mellitus with diabetic chronic kidney disease: Secondary | ICD-10-CM

## 2023-07-18 DIAGNOSIS — G4700 Insomnia, unspecified: Secondary | ICD-10-CM

## 2023-07-18 DIAGNOSIS — F411 Generalized anxiety disorder: Secondary | ICD-10-CM | POA: Diagnosis not present

## 2023-07-18 DIAGNOSIS — R809 Proteinuria, unspecified: Secondary | ICD-10-CM | POA: Diagnosis not present

## 2023-07-18 MED ORDER — ALPRAZOLAM 0.5 MG PO TABS: 0.5000 mg | ORAL_TABLET | Freq: Two times a day (BID) | ORAL | 3 refills | Status: DC | PRN

## 2023-07-18 NOTE — Assessment & Plan Note (Signed)
On Eliquis currently for A Fib, denies any melena or hematochezia Has had right hemicolectomy

## 2023-07-18 NOTE — Patient Instructions (Signed)
 Please stop taking Alfuzosin for the next 2 weeks and see if her dizziness improves.  Please take Lasix every other day instead of everyday.  Please check and remove Valsartan if you are still taking it.  Please continue to take medications as prescribed.  Please continue to follow low carb diet and ambulate as tolerated.

## 2023-07-18 NOTE — Assessment & Plan Note (Signed)
 Dizziness likely due to hypotension Advised to take Lasix every other day for now instead of once daily Hold Alfuzosin for now DC Valsartan, advised to check in home medicines Continue Midodrine

## 2023-07-18 NOTE — Assessment & Plan Note (Signed)
  Lab Results  Component Value Date   HGBA1C 6.6 02/19/2023   Well controlled On Farxiga for CHF now Avoid tighter control in her case due to risk of hypoglycemia related fall Has CKD, followed by Nephrology On statin

## 2023-07-18 NOTE — Assessment & Plan Note (Signed)
 Takes Xanax, sometimes twice daily - would avoid increasing dose of Xanax for now as she is also on Ambien On Celexa for MDD and anxiety On Wellbutrin for smoking cessation, plan to discontinue later Patient also takes Ambien for insomnia. She lives alone and could be a reason for her anxiety as well

## 2023-07-18 NOTE — Progress Notes (Signed)
 Established Patient Office Visit  Subjective:  Patient ID: [name], female    DOB: [birth year]  Age: 78 y.o. MRN: [id number]  CC:  Chief Complaint  Patient presents with   Care Management    4 month f/u, has concerns about dizziness and low bp . Has [facility]c forms to have completed.     HPI [name] is a 78 y.o. female with past medical history of hypertension, hyperlipidemia, mesenteric ischemia status post right hemicolectomy, coronary artery disease, hypothyroidism, type 2 diabetes mellitus, depression with anxiety, obesity, arthritis, chronic back pain who presents for f/u of her chronic medical conditions.  HTN and A-fib: BP is still borderline low today. Her valsartan was discontinued in the last visit, but she is unclear if she still takes it. She still reports dizziness with position change.  She still takes Lasix 40 mg once daily instead of PRN as advised by Cardiology.  She is on Eliquis and Aspirin. She has easy bruising.  Denies any chest pain or palpitations currently.  She has chronic exertional dyspnea.  She takes Imdur 15 mg QD for angina and Lasix as needed for leg swelling.  She has been placed on Farxiga for CHF and CKD.  MDD and GAD: She had anhedonia, insomnia, lack of interest in routine activities and agitation, but is improving slowly. Denies any SI or HI. She was placed on Celexa, which has helped her with MDD. She takes Xanax for anxiety and takes Ambien PRN for insomnia.  She also takes Wellbutrin, mainly for smoking cessation, and has cut down smoking up to 4-5 cigarettes per day.  COPD: She has been using Stiolto and as needed albuterol, but does not use it regularly.  She uses home O2 at nighttime for dyspnea and hypoxia (O2 sat less than 88%). Denies any fever or chills. C/o chronic nasal congestion and postnasal drip.    Past Medical History:  Diagnosis Date   Acute ischemic colitis (HCC) 09/11/2005   Anxiety    Arthritis    Atherosclerotic  vascular disease    Calcified plaque at the origin of the celiac and SMA   CHF (congestive heart failure) (HCC)    Coronary atherosclerosis of native coronary artery    Mulitvessel LVEF 50-50%, DES circ 1/11, occluded SVG to OM and SVG to diagonal , LVEF 60%   COVID-19 virus infection 11/07/2020   Depression    Diverticulosis of colon    Essential hypertension    Hypothyroidism    Mesenteric ischemia (HCC)    Mitral regurgitation    Mild to moderate   Mixed hyperlipidemia    Myocardial infarction (HCC) 1990   Obesity    Orthostatic hypotension    PAD (peripheral artery disease) (HCC)    PVC's (premature ventricular contractions)    Schizophrenia (HCC)    Sleep apnea    CPAP   TIA (transient ischemic attack) 11/20/2017   Tubular adenoma    Type 2 diabetes mellitus (HCC)    Vascular complications of mesenteric artery     Past Surgical History:  Procedure Laterality Date   BACK SURGERY     Lumbar spine surgery   Carotid endarectomy Bilateral    CARPAL TUNNEL RELEASE     Bilateral   CATARACT EXTRACTION W/PHACO Right 08/24/2013   Procedure: CATARACT EXTRACTION PHACO AND INTRAOCULAR LENS PLACEMENT (IOC);  Surgeon: Gemma Payor, MD;  Location: [facility];  Service: Ophthalmology;  Laterality: Right;  CDE 8.68   COLONOSCOPY  09/14/2005   Dr.  Gessner-ischemic colitis   COLONOSCOPY WITH ESOPHAGOGASTRODUODENOSCOPY (EGD)  04/14/2012   Dr. Jena Gauss- EGD= gastric erosions of doubtful clinical significance per bx- chronic inactive gastritis, benign small bowel mucosa. TCS=colonic diverticulosis, tubular adenoma   CORONARY ARTERY BYPASS GRAFT     LIMA-LAD; SVG-OM; SVG-DX in 1995 [facility]   NECK SURGERY     Cervical laminectomy   PARTIAL HYSTERECTOMY     RIGHT/LEFT HEART CATH AND CORONARY/GRAFT ANGIOGRAPHY N/A 04/12/2021   Procedure: RIGHT/LEFT HEART CATH AND CORONARY/GRAFT ANGIOGRAPHY;  Surgeon: Iran Ouch, MD;  Location: [facility];  Service: Cardiovascular;  Laterality: N/A;    stents x4      Family History  Problem Relation Age of Onset   Alcohol abuse Mother    Alcohol abuse Father    Coronary artery disease Other    Hypertension Other    Crohn's disease Sister 73   Stroke Daughter     Social History   Socioeconomic History   Marital status: Widowed    Spouse name: Not on file   Number of children: 4   Years of education: Not on file   Highest education level: Not on file  Occupational History   Occupation: DISABLED    Employer: UNEMPLOYED  Tobacco Use   Smoking status: Every Day    Current packs/day: 0.50    Average packs/day: 0.5 packs/day for 50.6 years (25.3 ttl pk-yrs)    Types: Cigarettes    Start date: 12/30/2018    Passive exposure: Never   Smokeless tobacco: Never  Vaping Use   Vaping status: Never Used  Substance and Sexual Activity   Alcohol use: Yes    Alcohol/week: 0.0 standard drinks of alcohol    Comment: occasionally    Drug use: No   Sexual activity: Not Currently  Other Topics Concern   Not on file  Social History Narrative   LIves alone   Social Drivers of Health   Financial Resource Strain: Low Risk  (06/05/2022)   Received [facility], [facility]   Overall Financial Resource Strain (CARDIA)    Difficulty of Paying Living Expenses: Not hard at all  Food Insecurity: No Food Insecurity (06/05/2022)   Received from [facility], [facility]   Hunger Vital Sign    Worried About Running Out of Food in the Last Year: Never true    Ran Out of Food in the Last Year: Never true  Transportation Needs: No Transportation Needs (06/05/2022)   Received from [facility], [facility]   [facility] - Transportation    Lack of Transportation (Medical): No    Lack of Transportation (Non-Medical): No  Physical Activity: Inactive (02/27/2021)   Exercise Vital Sign    Days of Exercise per Week: 0 days    Minutes of Exercise per Session: 0 min  Stress: No Stress Concern Present (02/27/2021)   [employer] of [employer] - Occupational Stress Questionnaire    Feeling of Stress : Only a little  Social Connections: Socially Isolated (02/27/2021)   Social Connection and Isolation Panel [NHANES]    Frequency of Communication with Friends and Family: More than three times a week    Frequency of Social Gatherings with Friends and Family: Never    Attends Religious Services: Never    Database administrator or Organizations: No    Attends Banker Meetings: Never    Marital Status: Widowed  Intimate Partner Violence: Not At Risk (02/27/2021)   Humiliation,  Afraid, Rape, and Kick questionnaire    Fear of Current or Ex-Partner: No    Emotionally Abused: No    Physically Abused: No    Sexually Abused: No    Outpatient Medications Prior to Visit  Medication Sig Dispense Refill   albuterol (VENTOLIN HFA) 108 (90 Base) MCG/ACT inhaler Inhale 1 puff into the lungs every 6 (six) hours as needed for wheezing or shortness of breath.     alfuzosin (UROXATRAL) 10 MG 24 hr tablet Take 1 tablet (10 mg total) by mouth at bedtime. 30 tablet 11   apixaban (ELIQUIS) 5 MG TABS tablet Take 1 tablet (5 mg total) by mouth 2 (two) times daily. 60 tablet 5   aspirin EC 81 MG tablet Take 81 mg by mouth at bedtime.     Blood Pressure Monitoring (OMRON 3 SERIES BP MONITOR) DEVI Use a directed 1 each 1   buPROPion (WELLBUTRIN) 75 MG tablet Take 1 tablet (75 mg total) by mouth 2 (two) times daily. 60 tablet 5   calcitRIOL (ROCALTROL) 0.25 MCG capsule Take 1 capsule (0.25 mcg total) by mouth 2 (two) times a week. 30 capsule 0   citalopram (CELEXA) 20 MG tablet Take 1 tablet (20 mg total) by mouth daily. 90 tablet 1   clotrimazole-betamethasone (LOTRISONE) cream Apply 1 Application topically daily. 30 g 0   dapagliflozin propanediol (FARXIGA) 5 MG TABS tablet TAKE 1 TABLET BY MOUTH DAILY 30 tablet 5   furosemide (LASIX) 40 MG tablet Take 1 tablet (40 mg total) by mouth daily as needed for fluid or  edema.     ipratropium-albuterol (DUONEB) 0.5-2.5 (3) MG/3ML SOLN Take 3 mLs by nebulization 4 times daily and as needed for shortness of breath or wheezing. 450 mL 11   isosorbide mononitrate (IMDUR) 30 MG 24 hr tablet Take 0.5 tablets (15 mg total) by mouth daily. 15 tablet 5   levothyroxine (SYNTHROID) 50 MCG tablet Take 1 tablet (50 mcg total) by mouth daily before breakfast. 90 tablet 3   midodrine (PROAMATINE) 2.5 MG tablet Take 1 tablet (2.5 mg total) by mouth 3 (three) times daily with meals. 270 tablet 1   nitroGLYCERIN (NITROSTAT) 0.4 MG SL tablet Place 1 tablet (0.4 mg total) under the tongue every 5 (five) minutes x 3 doses as needed for chest pain. 25 tablet 3   nystatin (MYCOSTATIN/NYSTOP) powder Apply 1 Application topically 3 (three) times daily. 15 g 0   pravastatin (PRAVACHOL) 80 MG tablet TAKE ONE (1) TABLET BY MOUTH EVERY DAY 90 tablet 3   Tiotropium Bromide-Olodaterol (STIOLTO RESPIMAT) 2.5-2.5 MCG/ACT AERS Inhale 2 puffs into the lungs daily. 4 g 11   zolpidem (AMBIEN) 10 MG tablet Take 1 tablet (10 mg total) by mouth at bedtime as needed. for sleep 30 tablet 2   ALPRAZolam (XANAX) 0.5 MG tablet Take 1 tablet (0.5 mg total) by mouth 2 (two) times daily as needed. for anxiety 60 tablet 3   No facility-administered medications prior to visit.    No Known Allergies  ROS Review of Systems  Constitutional:  Negative for chills and fever.  HENT:  Negative for congestion, sinus pressure and sinus pain.   Eyes:  Negative for pain and discharge.  Respiratory:  Positive for cough (chronic) and shortness of breath.   Cardiovascular:  Positive for leg swelling. Negative for chest pain and palpitations.  Gastrointestinal:  Positive for nausea. Negative for diarrhea and vomiting.  Genitourinary:  Negative for dysuria and hematuria.  Musculoskeletal:  Positive for arthralgias,  back pain, gait problem and joint swelling.  Skin:  Negative for rash.       Bruising over UE and LE   Neurological:  Positive for dizziness and weakness. Negative for headaches.  Hematological:  Bruises/bleeds easily.  Psychiatric/Behavioral:  Positive for agitation and sleep disturbance. Negative for behavioral problems. The patient is nervous/anxious.       Objective:    Physical Exam Vitals reviewed.  Constitutional:      General: She is not in acute distress.    Appearance: She is obese. She is not diaphoretic.  HENT:     Head: Normocephalic and atraumatic.     Nose: Congestion present.     Mouth/Throat:     Mouth: Mucous membranes are moist.     Pharynx: No posterior oropharyngeal erythema.  Eyes:     General: No scleral icterus.    Extraocular Movements: Extraocular movements intact.  Cardiovascular:     Rate and Rhythm: Normal rate. Rhythm irregular.     Heart sounds: Normal heart sounds. No murmur heard. Pulmonary:     Breath sounds: No wheezing or rales.  Musculoskeletal:     Cervical back: Normal range of motion and neck supple. No rigidity or tenderness.     Right lower leg: No edema.     Left lower leg: No edema.  Skin:    General: Skin is warm.     Findings: Bruising (over b/l UE and LE) present. No rash.     Comments: Multiple whitish skin eruptions over b/l UE and back -likely actinic keratosis  Neurological:     General: No focal deficit present.     Mental Status: She is alert and oriented to person, place, and time.     Sensory: No sensory deficit.     Motor: No weakness.     Gait: Gait abnormal (Likely in the setting of baseline hip pain).  Psychiatric:        Mood and Affect: Mood is anxious.        Behavior: Behavior normal.     BP 98/70   Pulse 62   Ht 5\' 3"  (1.6 m)   Wt 219 lb 9.6 oz (99.6 kg)   SpO2 100%   BMI 38.90 kg/m  Wt Readings from Last 3 Encounters:  07/18/23 219 lb 9.6 oz (99.6 kg)  07/16/23 219 lb (99.3 kg)  05/02/23 220 lb 3.2 oz (99.9 kg)    Lab Results  Component Value Date   TSH 2.140 11/15/2022   Lab Results   Component Value Date   WBC 6.9 12/13/2022   HGB 15.5 12/13/2022   HCT 47.2 (H) 12/13/2022   MCV 99 (H) 12/13/2022   PLT 239 12/13/2022   Lab Results  Component Value Date   NA 138 12/13/2022   K 4.5 12/13/2022   CO2 25 12/13/2022   GLUCOSE 147 (H) 12/13/2022   BUN 26 12/13/2022   CREATININE 1.61 (H) 12/13/2022   BILITOT 0.5 11/15/2022   ALKPHOS 61 11/15/2022   AST 17 11/15/2022   ALT 19 11/15/2022   PROT 6.7 11/15/2022   ALBUMIN 4.0 11/15/2022   CALCIUM 9.2 12/13/2022   ANIONGAP 9 03/13/2021   EGFR 38.0 02/19/2023   Lab Results  Component Value Date   CHOL 131 11/15/2022   Lab Results  Component Value Date   HDL 55 11/15/2022   Lab Results  Component Value Date   LDLCALC 59 11/15/2022   Lab Results  Component Value Date   TRIG 91  11/15/2022   Lab Results  Component Value Date   CHOLHDL 2.4 11/15/2022   Lab Results  Component Value Date   HGBA1C 6.6 02/19/2023      Assessment & Plan:   Problem List Items Addressed This Visit       Cardiovascular and Mediastinum   Benign essential hypertension   BP Readings from Last 1 Encounters:  07/18/23 98/70   Overall very tightly controlled considering her age Advised to DC valsartan for now and Lasix every other day for now instead of once daily Hold Alfuzosin as she has dizziness Counseled for compliance with the medications Advised DASH diet      Chronic mesenteric ischemia (HCC)   On Eliquis currently for A Fib, denies any melena or hematochezia Has had right hemicolectomy      PAD (peripheral artery disease) (HCC)   On aspirin, Eliquis and statin Denies claudication symptoms currently      Atrial fibrillation (HCC)   On Eliquis now, stopped Plavix Currently rate controlled,  metoprolol was discontinued by Cardiology as she has hypotensive spells Followed by Cardiology      HFrEF (heart failure with reduced ejection fraction) (HCC)   Last echo (02/22) showed LVEF of 45 to 50% She has  chronic leg swelling, but has improved now Takes Furosemide 40 mg once daily, advised to take every other day for now due to dizziness and hypotension On Midodrine for hypotension now      Hypotension due to drugs - Primary   Dizziness likely due to hypotension Advised to take Lasix every other day for now instead of once daily Hold Alfuzosin for now DC Valsartan, advised to check in home medicines Continue Midodrine        Endocrine   DM2 (diabetes mellitus, type 2) with CKD    Lab Results  Component Value Date   HGBA1C 6.6 02/19/2023   Well controlled On Farxiga for CHF now Avoid tighter control in her case due to risk of hypoglycemia related fall Has CKD, followed by Nephrology On statin      Hypothyroidism   Lab Results  Component Value Date   TSH 2.140 11/15/2022   On Levothyroxine 50 mcg - advised to take it regularly Check TSH and free T4 later, adjust dose accordingly        Genitourinary   Chronic kidney disease, stage 3b (HCC)   Likely due to HTN and DM On Lasix for HFrEF, takes it QD for leg swelling, advised to take it every other day for now Avoid nephrotoxic agents including NSAIDs Followed by Dr. Wolfgang Phoenix - last BMP and visit note reviewed        Other   GAD (generalized anxiety disorder)   Takes Xanax, sometimes twice daily - would avoid increasing dose of Xanax for now as she is also on Ambien On Celexa for MDD and anxiety On Wellbutrin for smoking cessation, plan to discontinue later Patient also takes Ambien for insomnia. She lives alone and could be a reason for her anxiety as well      Relevant Medications   ALPRAZolam (XANAX) 0.5 MG tablet     Meds ordered this encounter  Medications   ALPRAZolam (XANAX) 0.5 MG tablet    Sig: Take 1 tablet (0.5 mg total) by mouth 2 (two) times daily as needed. for anxiety    Dispense:  60 tablet    Refill:  3    Follow-up: Return in about 4 months (around 11/17/2023) for Annual physical (after  11/16/23).    Anabel Halon, MD

## 2023-07-18 NOTE — Assessment & Plan Note (Signed)
 Likely due to HTN and DM On Lasix for HFrEF, takes it QD for leg swelling, advised to take it every other day for now Avoid nephrotoxic agents including NSAIDs Followed by Dr. Wolfgang Phoenix - last BMP and visit note reviewed

## 2023-07-18 NOTE — Assessment & Plan Note (Signed)
 Last echo (02/22) showed LVEF of 45 to 50% She has chronic leg swelling, but has improved now Takes Furosemide 40 mg once daily, advised to take every other day for now due to dizziness and hypotension On Midodrine for hypotension now

## 2023-07-18 NOTE — Assessment & Plan Note (Signed)
 On aspirin, Eliquis and statin Denies claudication symptoms currently

## 2023-07-18 NOTE — Assessment & Plan Note (Signed)
 BP Readings from Last 1 Encounters:  07/18/23 98/70   Overall very tightly controlled considering her age Advised to DC valsartan for now and Lasix every other day for now instead of once daily Hold Alfuzosin as she has dizziness Counseled for compliance with the medications Advised DASH diet

## 2023-07-18 NOTE — Assessment & Plan Note (Signed)
 On Eliquis now, stopped Plavix Currently rate controlled,  metoprolol was discontinued by Cardiology as she has hypotensive spells Followed by Cardiology

## 2023-07-18 NOTE — Assessment & Plan Note (Signed)
 Lab Results  Component Value Date   TSH 2.140 11/15/2022   On Levothyroxine 50 mcg - advised to take it regularly Check TSH and free T4 later, adjust dose accordingly

## 2023-07-23 ENCOUNTER — Other Ambulatory Visit: Payer: Self-pay | Admitting: Cardiology

## 2023-08-01 ENCOUNTER — Telehealth: Payer: Self-pay

## 2023-08-01 ENCOUNTER — Other Ambulatory Visit: Payer: Self-pay | Admitting: Internal Medicine

## 2023-08-01 ENCOUNTER — Other Ambulatory Visit: Payer: Self-pay

## 2023-08-01 DIAGNOSIS — E1122 Type 2 diabetes mellitus with diabetic chronic kidney disease: Secondary | ICD-10-CM | POA: Diagnosis not present

## 2023-08-01 DIAGNOSIS — J449 Chronic obstructive pulmonary disease, unspecified: Secondary | ICD-10-CM

## 2023-08-01 DIAGNOSIS — N2581 Secondary hyperparathyroidism of renal origin: Secondary | ICD-10-CM | POA: Diagnosis not present

## 2023-08-01 DIAGNOSIS — R809 Proteinuria, unspecified: Secondary | ICD-10-CM | POA: Diagnosis not present

## 2023-08-01 DIAGNOSIS — N1831 Chronic kidney disease, stage 3a: Secondary | ICD-10-CM | POA: Diagnosis not present

## 2023-08-01 NOTE — Telephone Encounter (Signed)
 Copied from CRM [id number]. Topic: Clinical - Prescription Issue >> Aug 01, 2023  1:53 PM Franchot Heidelberg wrote: Reason for CRM: Pt's granddaughter called to report that the patient is still waiting for her PCP to correspond with the DME company regarding her CPAP machine. [employer] is the supplier.   She is requesting a call back because the patient really needs this for her breathing at night, says she has been trying to get this resolved since last September. Says she called two weeks ago regarding this same thing.   Best contact: [phone]

## 2023-08-01 NOTE — Telephone Encounter (Signed)
 Copied from CRM [id number]. Topic: Clinical - Medication Refill >> Aug 01, 2023  1:51 PM Franchot Heidelberg wrote: Most Recent Primary Care Visit:  Provider: Anabel Halon  Department: [facility]  Visit Type: OFFICE VISIT  Date: 07/18/2023  Medication: Tiotropium Bromide-Olodaterol (STIOLTO RESPIMAT) 2.5-2.5 MCG/ACT AERS   albuterol (VENTOLIN HFA) 108 (90 Base) MCG/ACT inhaler   Has the patient contacted their pharmacy? Yes (Agent: If no, request that the patient contact the pharmacy for the refill. If patient does not wish to contact the pharmacy document the reason why and proceed with request.) (Agent: If yes, when and what did the pharmacy advise?)  Is this the correct pharmacy for this prescription? Yes If no, delete pharmacy and type the correct one.  This is the patient's preferred pharmacy:  [facility] [place] - [address] [place] [address] Phone: [phone] Fax: [phone]  Has the prescription been filled recently? Yes  Is the patient out of the medication? Yes  Has the patient been seen for an appointment in the last year OR does the patient have an upcoming appointment? Yes  Can we respond through MyChart? No  Agent: Please be advised that Rx refills may take up to 3 business days. We ask that you follow-up with your pharmacy.

## 2023-08-02 ENCOUNTER — Telehealth: Payer: Self-pay | Admitting: Primary Care

## 2023-08-02 ENCOUNTER — Other Ambulatory Visit: Payer: Self-pay | Admitting: Internal Medicine

## 2023-08-02 DIAGNOSIS — G4733 Obstructive sleep apnea (adult) (pediatric): Secondary | ICD-10-CM

## 2023-08-02 DIAGNOSIS — J449 Chronic obstructive pulmonary disease, unspecified: Secondary | ICD-10-CM

## 2023-08-02 DIAGNOSIS — J441 Chronic obstructive pulmonary disease with (acute) exacerbation: Secondary | ICD-10-CM

## 2023-08-02 MED ORDER — STIOLTO RESPIMAT 2.5-2.5 MCG/ACT IN AERS
2.0000 | INHALATION_SPRAY | Freq: Every day | RESPIRATORY_TRACT | 11 refills | Status: DC
Start: 2023-08-02 — End: 2024-03-19

## 2023-08-02 MED ORDER — ALBUTEROL SULFATE HFA 108 (90 BASE) MCG/ACT IN AERS: 1.0000 | INHALATION_SPRAY | Freq: Four times a day (QID) | RESPIRATORY_TRACT | 2 refills | Status: DC | PRN

## 2023-08-02 NOTE — Telephone Encounter (Signed)
 PT's grand daughter ( DPR) calling wondering why PT has not rec'd a CPAP machine yet. States no one called them to set up a appt. Please call to advise @ [phone]  Please call before 10 or after lunch.

## 2023-08-02 NOTE — Telephone Encounter (Signed)
 Pt informed

## 2023-08-02 NOTE — Telephone Encounter (Signed)
 I called and spoke with Selena Batten. (DPR) I informed Selena Batten that the order was already placed to [facility] back in October of 2024, and they confirmed that they did received it. Selena Batten is going to call [facility] about this. Selena Batten would like to know if a new order can be sent in for pt? Please advise.

## 2023-08-02 NOTE — Telephone Encounter (Signed)
I replaced order

## 2023-08-02 NOTE — Telephone Encounter (Signed)
 Spoke to her grandaughter provided the phone number is asking for refill on inhalers as well, ok to provide?

## 2023-08-08 ENCOUNTER — Telehealth: Payer: Self-pay

## 2023-08-08 NOTE — Telephone Encounter (Signed)
 I have sent it to [facility]

## 2023-08-08 NOTE — Telephone Encounter (Signed)
-----   Message from Glenford Bayley sent at 08/08/2023 12:15 PM EDT ----- Please see below ----- Message ----- From: Anabel Halon, MD Sent: 08/01/2023   4:29 PM EDT To: Glenford Bayley, NP  Wynelle Cleveland,  I hope you are doing well. I am contacting you regarding our mutual patient - [name]. Her granddaughter has been contacting my office for her CPAP needs. I have asked them in the past to contact your office. I am not sure if there is any miscommunication concern. Can you please ask one of your staff members to contact them to make sure she gets her CPAP machine and supplies?  [facility] is the supplier according to the patient's caregiver.   Patient's best contact: [phone]  Please let me know if there is any concern.  Thank you.  Rutwik [employer]

## 2023-08-08 NOTE — Telephone Encounter (Signed)
 I called and spoke to pt's grand daughter, [name]. [facility])  [name] stated that th pt has not received the CPAP machine and supplies. I informed pt that an order was placed on 08-02-23 but to [facility]. [name] stated that this needs to go to [facility] . Routing to [facility] team to see if the DME can be changed ASAP.

## 2023-08-12 ENCOUNTER — Telehealth: Payer: Self-pay | Admitting: Urology

## 2023-08-12 NOTE — Telephone Encounter (Signed)
 Patient granddaughter called , pt was seen by Dr Carrolyn Clan and he suggested asking DR Claretta Croft to call in estrogen cream for the patient  - [facility] in [place].

## 2023-08-13 NOTE — Telephone Encounter (Signed)
 Please see patient request below regarding estrogen cream.

## 2023-08-14 ENCOUNTER — Ambulatory Visit: Payer: Medicare Other

## 2023-08-14 VITALS — BP 98/70 | Ht 63.0 in | Wt 220.0 lb

## 2023-08-14 DIAGNOSIS — Z2821 Immunization not carried out because of patient refusal: Secondary | ICD-10-CM

## 2023-08-14 DIAGNOSIS — Z0000 Encounter for general adult medical examination without abnormal findings: Secondary | ICD-10-CM

## 2023-08-14 NOTE — Progress Notes (Signed)
 Because this visit was a virtual/telehealth visit,  certain criteria was not obtained, such a blood pressure, CBG if applicable, and timed get up and go. Any medications not marked as "taking" were not mentioned during the medication reconciliation part of the visit. Any vitals not documented were not able to be obtained due to this being a telehealth visit or patient was unable to self-report a recent blood pressure reading due to a lack of equipment at home via telehealth. Vitals that have been documented are verbally provided by the patient.  Subjective:   [name] is a 78 y.o. who presents for a Medicare Wellness preventive visit.  Visit Complete: Virtual I connected with  Yvonne Kendall on 08/14/23 by a audio enabled telemedicine application and verified that I am speaking with the correct person using two identifiers.  Patient Location: Home  Provider Location: Home Office  I discussed the limitations of evaluation and management by telemedicine. The patient expressed understanding and agreed to proceed.  Vital Signs: Because this visit was a virtual/telehealth visit, some criteria may be missing or patient reported. Any vitals not documented were not able to be obtained and vitals that have been documented are patient reported.  VideoDeclined- This patient declined Librarian, academic. Therefore the visit was completed with audio only.  Persons Participating in Visit: Patient.  AWV Questionnaire: No: Patient Medicare AWV questionnaire was not completed prior to this visit.  Cardiac Risk Factors include: advanced age (>50men, >50 women);hypertension;Other (see comment);obesity (BMI >30kg/m2);smoking/ tobacco exposure, Risk factor comments: afib , copd     Objective:    Today's Vitals   08/14/23 1237 08/14/23 1239  BP: 98/70   Weight: 220 lb (99.8 kg)   Height: 5\' 3"  (1.6 m)   PainSc:  0-No pain   Body mass index is 38.97 kg/m.      08/14/2023   12:39 PM 08/13/2022    1:52 PM 04/12/2021   10:53 AM 03/13/2021    8:57 PM 02/27/2021   10:20 AM 09/27/2020    4:54 PM 05/07/2020   12:38 PM  Advanced Directives  Does Patient Have a Medical Advance Directive? No No No No No No No  Would patient like information on creating a medical advance directive? No - Patient declined Yes (MAU/Ambulatory/Procedural Areas - Information given) No - Patient declined  No - Patient declined No - Patient declined     Current Medications (verified) Outpatient Encounter Medications as of 08/14/2023  Medication Sig   albuterol (VENTOLIN HFA) 108 (90 Base) MCG/ACT inhaler Inhale 1 puff into the lungs every 6 (six) hours as needed for wheezing or shortness of breath.   alfuzosin (UROXATRAL) 10 MG 24 hr tablet Take 1 tablet (10 mg total) by mouth at bedtime.   ALPRAZolam (XANAX) 0.5 MG tablet Take 1 tablet (0.5 mg total) by mouth 2 (two) times daily as needed. for anxiety   apixaban (ELIQUIS) 5 MG TABS tablet Take 1 tablet (5 mg total) by mouth 2 (two) times daily.   aspirin EC 81 MG tablet Take 81 mg by mouth at bedtime.   Blood Pressure Monitoring (OMRON 3 SERIES BP MONITOR) DEVI Use a directed   buPROPion (WELLBUTRIN) 75 MG tablet Take 1 tablet (75 mg total) by mouth 2 (two) times daily.   calcitRIOL (ROCALTROL) 0.25 MCG capsule Take 1 capsule (0.25 mcg total) by mouth 2 (two) times a week.   citalopram (CELEXA) 20 MG tablet Take 1 tablet (20 mg total) by mouth daily.  clotrimazole-betamethasone (LOTRISONE) cream Apply 1 Application topically daily.   dapagliflozin propanediol (FARXIGA) 5 MG TABS tablet TAKE 1 TABLET BY MOUTH DAILY   furosemide (LASIX) 40 MG tablet Take 1 tablet (40 mg total) by mouth daily as needed for fluid or edema.   ipratropium-albuterol (DUONEB) 0.5-2.5 (3) MG/3ML SOLN Take 3 mLs by nebulization 4 times daily and as needed for shortness of breath or wheezing.   isosorbide mononitrate (IMDUR) 30 MG 24 hr tablet Take 0.5  tablets (15 mg total) by mouth daily.   levothyroxine (SYNTHROID) 50 MCG tablet Take 1 tablet (50 mcg total) by mouth daily before breakfast.   midodrine (PROAMATINE) 2.5 MG tablet Take 1 tablet (2.5 mg total) by mouth 3 (three) times daily with meals.   nitroGLYCERIN (NITROSTAT) 0.4 MG SL tablet DISSOLVE 1 TABLET UNDER THE  TONGUE EVERY 5 MINUTES AS NEEDED FOR CHEST PAIN. MAX OF 3 TABLETS IN 15 MINUTES. CALL 911 IF PAIN  PERSISTS.   nystatin (MYCOSTATIN/NYSTOP) powder Apply 1 Application topically 3 (three) times daily.   pravastatin (PRAVACHOL) 80 MG tablet TAKE ONE (1) TABLET BY MOUTH EVERY Misty   Tiotropium Bromide-Olodaterol (STIOLTO RESPIMAT) 2.5-2.5 MCG/ACT AERS Inhale 2 puffs into the lungs daily.   zolpidem (AMBIEN) 10 MG tablet TAKE 1 TABLET BY MOUTH AT BEDTIME AS NEEDED FOR SLEEP   No facility-administered encounter medications on file as of 08/14/2023.    Allergies (verified) Patient has no known allergies.   History: Past Medical History:  Diagnosis Date   Acute ischemic colitis (HCC) 09/11/2005   Anxiety    Arthritis    Atherosclerotic vascular disease    Calcified plaque at the origin of the celiac and SMA   CHF (congestive heart failure) (HCC)    Coronary atherosclerosis of native coronary artery    Mulitvessel LVEF 50-50%, DES circ 1/11, occluded SVG to OM and SVG to diagonal , LVEF 60%   COVID-19 virus infection 11/07/2020   Depression    Diverticulosis of colon    Essential hypertension    Hypothyroidism    Mesenteric ischemia (HCC)    Mitral regurgitation    Mild to moderate   Mixed hyperlipidemia    Myocardial infarction (HCC) 1990   Obesity    Orthostatic hypotension    PAD (peripheral artery disease) (HCC)    PVC's (premature ventricular contractions)    Schizophrenia (HCC)    Sleep apnea    CPAP   TIA (transient ischemic attack) 11/20/2017   Tubular adenoma    Type 2 diabetes mellitus (HCC)    Vascular complications of mesenteric artery    Past  Surgical History:  Procedure Laterality Date   BACK SURGERY     Lumbar spine surgery   Carotid endarectomy Bilateral    CARPAL TUNNEL RELEASE     Bilateral   CATARACT EXTRACTION W/PHACO Right 08/24/2013   Procedure: CATARACT EXTRACTION PHACO AND INTRAOCULAR LENS PLACEMENT (IOC);  Surgeon: Anner Kill, MD;  Location: [facility];  Service: Ophthalmology;  Laterality: Right;  CDE 8.68   COLONOSCOPY  09/14/2005   Dr. Lubertha Rush colitis   COLONOSCOPY WITH ESOPHAGOGASTRODUODENOSCOPY (EGD)  04/14/2012   Dr. Riley Cheadle- EGD= gastric erosions of doubtful clinical significance per bx- chronic inactive gastritis, benign small bowel mucosa. TCS=colonic diverticulosis, tubular adenoma   CORONARY ARTERY BYPASS GRAFT     LIMA-LAD; SVG-OM; SVG-DX in 1995 [facility]   NECK SURGERY     Cervical laminectomy   PARTIAL HYSTERECTOMY     RIGHT/LEFT HEART CATH AND CORONARY/GRAFT ANGIOGRAPHY N/A 04/12/2021  Procedure: RIGHT/LEFT HEART CATH AND CORONARY/GRAFT ANGIOGRAPHY;  Surgeon: Iran Ouch, MD;  Location: [facility];  Service: Cardiovascular;  Laterality: N/A;   stents x4     Family History  Problem Relation Age of Onset   Alcohol abuse Mother    Alcohol abuse Father    Coronary artery disease Other    Hypertension Other    Crohn's disease Sister 46   Stroke Daughter    Social History   Socioeconomic History   Marital status: Widowed    Spouse name: Not on file   Number of children: 4   Years of education: Not on file   Highest education level: Not on file  Occupational History   Occupation: DISABLED    Employer: UNEMPLOYED  Tobacco Use   Smoking status: Every Misty    Current packs/Misty: 0.50    Average packs/Misty: 0.5 packs/Misty for 50.7 years (25.4 ttl pk-yrs)    Types: Cigarettes    Start date: 12/30/2018    Passive exposure: Never   Smokeless tobacco: Never  Vaping Use   Vaping status: Never Used  Substance and Sexual Activity   Alcohol use: Yes    Alcohol/week: 0.0 standard drinks  of alcohol    Comment: occasionally    Drug use: No   Sexual activity: Not Currently  Other Topics Concern   Not on file  Social History Narrative   LIves alone   Social Drivers of Health   Financial Resource Strain: Low Risk  (08/14/2023)   Overall Financial Resource Strain (CARDIA)    Difficulty of Paying Living Expenses: Not hard at all  Food Insecurity: No Food Insecurity (08/14/2023)   Hunger Vital Sign    Worried About Running Out of Food in the Last Year: Never true    Ran Out of Food in the Last Year: Never true  Transportation Needs: No Transportation Needs (08/14/2023)   PRAPARE - Administrator, Civil Service (Medical): No    Lack of Transportation (Non-Medical): No  Physical Activity: Insufficiently Active (08/14/2023)   Exercise Vital Sign    Days of Exercise per Week: 2 days    Minutes of Exercise per Session: 20 min  Stress: No Stress Concern Present (08/14/2023)   [employer] of Occupational Health - Occupational Stress Questionnaire    Feeling of Stress : Only a little  Social Connections: Socially Isolated (08/14/2023)   Social Connection and Isolation Panel [NHANES]    Frequency of Communication with Friends and Family: More than three times a week    Frequency of Social Gatherings with Friends and Family: Three times a week    Attends Religious Services: Never    Active Member of Clubs or Organizations: No    Attends Banker Meetings: Never    Marital Status: Widowed    Tobacco Counseling Ready to quit: Not Answered Counseling given: Not Answered    Clinical Intake:  Pre-visit preparation completed: Yes  Pain : No/denies pain Pain Score: 0-No pain     BMI - recorded: 38.97 Nutritional Status: BMI > 30  Obese Nutritional Risks: None Diabetes: Yes CBG done?: No Did pt. bring in CBG monitor from home?: No  Lab Results  Component Value Date   HGBA1C 6.6 02/19/2023   HGBA1C 6.1 (H) 11/15/2022   HGBA1C 6.0  04/13/2022     How often do you need to have someone help you when you read instructions, pamphlets, or other written materials from your doctor or pharmacy?: 1 -  Never     Information entered by :: Estell Harpin   Activities of Daily Living     08/14/2023   12:43 PM  In your present state of health, do you have any difficulty performing the following activities:  Hearing? 1  Vision? 0  Difficulty concentrating or making decisions? 1  Walking or climbing stairs? 0  Dressing or bathing? 0  Doing errands, shopping? 0  Preparing Food and eating ? Y  Comment graddaughter helps  Using the Toilet? N  In the past six months, have you accidently leaked urine? Y  Do you have problems with loss of bowel control? N  Managing your Medications? N  Managing your Finances? N  Housekeeping or managing your Housekeeping? N    Patient Care Team: Anabel Halon, MD as PCP - General (Internal Medicine) Jonelle Sidle, MD as PCP - Cardiology (Cardiology) Jena Gauss Gerrit Friends, MD as Attending Physician (Gastroenterology) Randa Lynn, MD as Consulting Physician (Nephrology) Glenford Bayley, NP as Nurse Practitioner (Pulmonary Disease) Coralyn Helling, MD (Inactive) (Pulmonary Disease) Clinton Gallant, RN as [facility] Care Management  Indicate any recent Medical Services you may have received from other than [facility] providers in the past year (date may be approximate).     Assessment:   This is a routine wellness examination for Tenaya.  Hearing/Vision screen Hearing Screening - Comments:: Patient has some hearing issues  Vision Screening - Comments:: Only readers   Goals Addressed             This Visit's Progress    Patient Stated       Patient would like to be healthy        Depression Screen     08/14/2023   12:45 PM 07/18/2023    1:35 PM 03/20/2023    1:34 PM 01/31/2023   10:49 AM 11/15/2022    1:31 PM 08/13/2022    1:59 PM 07/17/2022     2:15 PM  PHQ 2/9 Scores  PHQ - 2 Score 1 0 2 2 0 2 6  PHQ- 9 Score 4 0 17 8   17     Fall Risk     08/14/2023   12:42 PM 07/18/2023    1:35 PM 03/20/2023    1:34 PM 11/15/2022    1:31 PM 08/13/2022    1:59 PM  Fall Risk   Falls in the past year? 1 0 1 0   Number falls in past yr: 1 0 1 0   Injury with Fall? 1 0 1 0 --  Comment     3 mnths ago . Hit head, did not go to ED.  Risk for fall due to : History of fall(s);Impaired balance/gait;Orthopedic patient No Fall Risks Other (Comment)    Risk for fall due to: Comment   age    Follow up Falls prevention discussed;Falls evaluation completed Falls evaluation completed Falls evaluation completed      MEDICARE RISK AT HOME:  Medicare Risk at Home Any stairs in or around the home?: No If so, are there any without handrails?: No Home free of loose throw rugs in walkways, pet beds, electrical cords, etc?: Yes Adequate lighting in your home to reduce risk of falls?: Yes Life alert?: No Use of a cane, walker or w/c?: Yes (walker) Grab bars in the bathroom?: Yes Shower chair or bench in shower?: Yes Elevated toilet seat or a handicapped toilet?: Yes  TIMED UP AND GO:  Was the test performed?  No  Cognitive Function: 6CIT completed    02/27/2021   10:22 AM  MMSE - Mini Mental State Exam  Not completed: Unable to complete        08/14/2023   12:41 PM 08/13/2022    2:01 PM 02/27/2021   10:22 AM  6CIT Screen  What Year? 0 points 0 points 0 points  What month? 0 points 0 points 0 points  What time? 0 points 0 points 0 points  Count back from 20 0 points 0 points 0 points  Months in reverse 0 points 0 points 0 points  Repeat phrase 0 points 0 points 0 points  Total Score 0 points 0 points 0 points    Immunizations Immunization History  Administered Date(s) Administered   Fluad Quad(high Dose 65+) 02/09/2020, 04/13/2022   Fluad Trivalent(High Dose 65+) 03/20/2023   Influenza Split 04/10/2010   Influenza-Unspecified  03/06/2021   Moderna Sars-Covid-2 Vaccination 12/15/2019, 02/09/2020   PNEUMOCOCCAL CONJUGATE-20 03/10/2021    Screening Tests Health Maintenance  Topic Date Due   OPHTHALMOLOGY EXAM  Never done   DTaP/Tdap/Td (1 - Tdap) Never done   Zoster Vaccines- Shingrix (1 of 2) Never done   Lung Cancer Screening  Never done   DEXA SCAN  Never done   COVID-19 Vaccine (3 - Moderna risk series) 03/08/2020   Medicare Annual Wellness (AWV)  08/13/2023   HEMOGLOBIN A1C  08/20/2023   INFLUENZA VACCINE  11/29/2023   Diabetic kidney evaluation - eGFR measurement  02/19/2024   Diabetic kidney evaluation - Urine ACR  02/19/2024   FOOT EXAM  03/19/2024   Pneumonia Vaccine 70+ Years old  Completed   Hepatitis C Screening  Completed   HPV VACCINES  Aged Out   Meningococcal B Vaccine  Aged Out   Colonoscopy  Discontinued    Health Maintenance  Health Maintenance Due  Topic Date Due   OPHTHALMOLOGY EXAM  Never done   DTaP/Tdap/Td (1 - Tdap) Never done   Zoster Vaccines- Shingrix (1 of 2) Never done   Lung Cancer Screening  Never done   DEXA SCAN  Never done   COVID-19 Vaccine (3 - Moderna risk series) 03/08/2020   Medicare Annual Wellness (AWV)  08/13/2023   Health Maintenance Items Addressed: patient states she does not feel like she is already due for things her health maintenance . Patient declined  Additional Screening:  Vision Screening: Recommended annual ophthalmology exams for early detection of glaucoma and other disorders of the eye.  Dental Screening: Recommended annual dental exams for proper oral hygiene  Community Resource Referral / Chronic Care Management: CRR required this visit?  No   CCM required this visit?  No     Plan:     I have personally reviewed and noted the following in the patient's chart:   Medical and social history Use of alcohol, tobacco or illicit drugs  Current medications and supplements including opioid prescriptions. Patient is not currently  taking opioid prescriptions. Functional ability and status Nutritional status Physical activity Advanced directives List of other physicians Hospitalizations, surgeries, and ER visits in previous 12 months Vitals Screenings to include cognitive, depression, and falls Referrals and appointments  In addition, I have reviewed and discussed with patient certain preventive protocols, quality metrics, and best practice recommendations. A written personalized care plan for preventive services as well as general preventive health recommendations were provided to patient.     Freeda Jerry, [place]   08/14/2023   After Visit Summary: (MyChart) Due to  this being a telephonic visit, the after visit summary with patients personalized plan was offered to patient via MyChart   Notes: Nothing significant to report at this time.

## 2023-08-14 NOTE — Patient Instructions (Signed)
 [name] , Thank you for taking time to come for your Medicare Wellness Visit. I appreciate your ongoing commitment to your health goals. Please review the following plan we discussed and let me know if I can assist you in the future.   Referrals/Orders/Follow-Ups/Clinician Recommendations: follow up in 1 year  This is a list of the screening recommended for you and due dates:  Health Maintenance  Topic Date Due   Eye exam for diabetics  Never done   DTaP/Tdap/Td vaccine (1 - Tdap) Never done   Zoster (Shingles) Vaccine (1 of 2) Never done   Screening for Lung Cancer  Never done   DEXA scan (bone density measurement)  Never done   COVID-19 Vaccine (3 - Moderna risk series) 03/08/2020   Hemoglobin A1C  08/20/2023   Flu Shot  11/29/2023   Yearly kidney function blood test for diabetes  02/19/2024   Yearly kidney health urinalysis for diabetes  02/19/2024   Complete foot exam   03/19/2024   Medicare Annual Wellness Visit  08/13/2024   Pneumonia Vaccine  Completed   Hepatitis C Screening  Completed   HPV Vaccine  Aged Out   Meningitis B Vaccine  Aged Out   Colon Cancer Screening  Discontinued    Advanced directives: (Declined) Advance directive discussed with you today. Even though you declined this today, please call our office should you change your mind, and we can give you the proper paperwork for you to fill out.  Next Medicare Annual Wellness Visit scheduled for next year: Yes

## 2023-08-17 ENCOUNTER — Other Ambulatory Visit: Payer: Self-pay | Admitting: Nurse Practitioner

## 2023-08-20 ENCOUNTER — Other Ambulatory Visit: Payer: Self-pay

## 2023-08-20 ENCOUNTER — Ambulatory Visit: Admitting: Nurse Practitioner

## 2023-08-20 NOTE — Telephone Encounter (Signed)
 Please confirm I am sending in estradiol  0.1 mg 3x per week?

## 2023-08-20 NOTE — Progress Notes (Deleted)
 Office Visit    Patient Name: [name] Date of Encounter: 07/16/2023 PCP:  Meldon Sport, MD [facility]  Cardiologist:  Teddie Favre, MD  Advanced Practice Provider:  No care team member to display Electrophysiologist:  None   Chief Complaint and HPI    [name] is a 78 y.o. female with a hx of atypical A-flutter, CAD, s/p CABG in 1995, history of past stenting, hypertension, mixed hyperlipidemia, HFmrEF, OSA on CPAP, schizophrenia, type 2 diabetes, history of TIA, postop A-fib, and history of mesenteric ischemia, presents today for follow-up.   Previous CV history includes CABG in 1995 (LIMA-LAD, SVG-diagonal, SVG-OM, and SVG-PDA).  Underwent cardiac catheterization in 2011, received DES to circumflex.  Cardiac catheterization in 2022 revealed occluded grafts, patency of LIMA-LAD.  Was noted to have moderate in-stent restenosis in circumflex/OM distribution, was recommended to be medically managed.  Myoview  in 2022 revealed normal EF.  Was evaluated at [facility] in February 2024 for near syncope.  She had this experience after taking NTG.  Due to her mild orthostasis, it was felt that she was somewhat dehydrated and was given IV fluids.  CT of the head negative.  Antihypertensive regimen was decreased.  Last seen by Dr. Londa Rival on July 16, 2022.  She noted feeling better, dizziness improved.  She did note feeling weak with low stamina, this had been a chronic issue.  EKG in office revealed A-fib with nonspecific ST changes.  CHA2DS2-VASc score was found to be 7, discussion of anticoagulation was discussed.  Plavix  was stopped and replaced with Eliquis  5 mg twice daily.  05/02/2023 - Presents to office for follow-up by herself. Doing better since I last saw her. Her dizziness has resolved since starting midodrine  2.5 mg BID. Wants to know if she really needs the carotid duplex study done. Does admit to palpitations/tachcyardia sensation.  Says when she does house chores, she gives out, says she recently checked her HR during one of these episodes and found her HR to be 110. Denies any chest pain, syncope, presyncope, dizziness, orthopnea, PND, swelling or significant weight changes, acute bleeding, or claudication. Continues to admit to dependent edema at times and occasional shortness of breath that she attributes partly to her smoking.   07/16/2023 -presents today for follow-up with her granddaughter.  Patient is not a reliable historian due to forgetfulness at times that has been noticed during patient interview.  Admits to palpitations and symptoms of A-fib, says her heart rate feels like it is racing all the time, says symptoms resolve when she is resting/relaxing.  Does admit to some dizziness and symptoms of presyncope, denies any syncopal episodes.  Admits to some episodes of chest heaviness, says it feels like something is laying on her chest.  Denies any specific triggers and denies any alleviating or aggravating factors.  Difficult for her to describe. Denies any shortness of breath, syncope, orthopnea, PND, swelling or significant weight changes, acute bleeding, or claudication.  ROS: Negative.  See HPI.  EKGs/Labs/Other Studies Reviewed:   The following studies were reviewed today:  EKG:      Carotid duplex 05/2023:  Summary:  Right Carotid: Velocities in the right ICA are consistent with a 1-39%  stenosis. Non-hemodynamically significant plaque <50% noted in the  CCA. The ECA appears >50% stenosed.   Left Carotid: Velocities in the left ICA are consistent with a 1-39%  stenosis. Non-hemodynamically significant plaque <50% noted in the  CCA. The ECA appears <50%  stenosed.   Vertebrals:  Bilateral vertebral arteries demonstrate antegrade flow.  Subclavians: Right subclavian artery flow was disturbed. Normal flow hemodynamics were seen in the left subclavian artery.   Cardiac monitor 05/2023: ZIO monitor reviewed.   3 days, 1 hours analyzed.   Atrial fibrillation present throughout monitoring with heart rate ranging from 41 bpm up to 166 bpm and average heart rate 81 bpm. There were occasional PVCs representing 1.5% total beats and otherwise rare ventricular couplets. No pauses or high degree heart block.  Echo 10/2022:  1. Left ventricular ejection fraction, by estimation, is 45 to 50%. The  left ventricle has mildly decreased function. The left ventricle  demonstrates regional wall motion abnormalities (see scoring  diagram/findings for description). There is mild left  ventricular hypertrophy. Left ventricular diastolic parameters are  indeterminate.   2. Right ventricular systolic function is low normal. The right  ventricular size is normal. There is normal pulmonary artery systolic  pressure. The estimated right ventricular systolic pressure is 31.8 mmHg.   3. The mitral valve is degenerative. Mild to moderate mitral valve  regurgitation.   4. Tricuspid valve regurgitation is moderate.   5. The aortic valve is tricuspid. Aortic valve regurgitation is not  visualized. Aortic valve sclerosis is present, with no evidence of aortic  valve stenosis.   6. Aortic dilatation noted. There is mild dilatation of the ascending  aorta, measuring 41 mm.   7. The inferior vena cava is dilated in size with >50% respiratory  variability, suggesting right atrial pressure of 8 mmHg.   Comparison(s): Prior images reviewed side by side. LVEF stable in 45-50%  range. Normal estimated RVSP. Mild to moderate mitral and moderate  tricuspid regurgitation.  Right and left heart cath 03/2021:   Ost LM to Mid LM lesion is 40% stenosed.   1st Mrg lesion is 50% stenosed.   Prox Cx lesion is 50% stenosed.   Ost LAD to Prox LAD lesion is 90% stenosed.   Prox Cx to Mid Cx lesion is 99% stenosed.   Prox RCA to Mid RCA lesion is 100% stenosed.   Prox RCA lesion is 80% stenosed.   Origin lesion is 100% stenosed.    Origin to Prox Graft lesion is 100% stenosed.   LIMA graft was visualized by angiography and is normal in caliber.   1.  Severe underlying three-vessel coronary artery disease with patent LIMA to LAD.  Chronically occluded SVG to OM and SVG to diagonal.  The SVG to right PDA which was patent on most recent angiography in 2011 is now occluded.  Left circumflex stent from the proximal portion extending into OM1 is patent with moderate in-stent restenosis and subtotal occlusion of the AV groove left circumflex which was present before. 2.  Right heart catheterization showed high normal filling pressures, no pulmonary hypertension and normal cardiac output.   Recommendations: The SVG to right PDA is now occluded which is new but the RCA has good left-to-right collaterals.  The AV groove left circumflex was subtotally occluded before and jailed by previous stent.  This could not be crossed with a wire before. Recommend continuing medical therapy.  Cardiac monitor 12/2020:  ZIO XT reviewed. 6 days, 8 hours analyzed. Predominant rhythm is sinus with prolonged PR interval and heart rate ranging from 39 bpm (early morning) up to 116 bpm and average heart rate 59 bpm. Rare PACs including couplets and triplets were noted representing less than 1% total beats. Rare PVCs including couplets and triplets were  noted representing less than 1% total beats. There were also limited episodes of ventricular bigeminy and trigeminy. Multiple episodes of SVT were noted, these were generally brief with the longest lasting only 13 beats, nonsustained. There were no ventricular arrhythmias. No significant pauses.  Myoview  12/2020:    The study is low risk.   No ST deviation was noted. The ECG was negative for ischemia.   LV perfusion is abnormal. Defect 1: There is a small defect with mild reduction in uptake present in the apical anterior location(s) that is fixed. There is normal wall motion in the defect area. Consistent with  artifact caused by breast attenuation. Defect 2: There is a small defect with mild reduction in uptake present in the mid to basal inferior location(s) that is partially reversible. There is normal wall motion in the defect area. Consistent with ischemia.   Left ventricular function is normal. Nuclear stress EF: 61 %.   Breast attenuation artifact was present.   Low risk study with evidence of breast attenuation artifact and also mild mid to basal inferior ischemia, LVEF normal at 61%.  Carotid duplex 12/2020:  Summary:  Right Carotid: Velocities in the right ICA are consistent with a 1-39%  stenosis. The ECA appears >50% stenosed.   Left Carotid: Velocities in the left ICA are consistent with a 1-39%  stenosis.   Vertebrals: Left vertebral artery demonstrates antegrade flow. Right  atypical antegrade.  Subclavians: Right subclavian artery flow was disturbed. Normal flow hemodynamics were seen in the left subclavian artery.  Echo 05/2020:  IMPRESSIONS    1. Abnormal septal motion distal septal apical mid and basal inferior  wall hypokinesis . Left ventricular ejection fraction, by estimation, is  45 to 50%. The left ventricle has mildly decreased function. The left  ventricle has no regional wall motion  abnormalities. The left ventricular internal cavity size was mildly  dilated. There is moderate left ventricular hypertrophy. Left ventricular  diastolic parameters are consistent with Grade I diastolic dysfunction  (impaired relaxation).   2. Right ventricular systolic function is normal. The right ventricular  size is normal.   3. Left atrial size was mildly dilated.   4. The mitral valve is degenerative. Mild mitral valve regurgitation. No  evidence of mitral stenosis.   5. The aortic valve is tricuspid. Aortic valve regurgitation is not  visualized. Mild aortic valve sclerosis is present, with no evidence of  aortic valve stenosis.   6. The inferior vena cava is normal in size  with greater than 50%  respiratory variability, suggesting right atrial pressure of 3 mmHg.   Risk Assessment/Calculations:  CHA2DS2-VASc score is 7.   The ASCVD Risk score (Arnett DK, et al., 2019) failed to calculate for the following reasons:   Risk score cannot be calculated because patient has a medical history suggesting prior/existing ASCVD  Review of Systems    All other systems reviewed and are otherwise negative except as noted above.  Physical Exam    VS:  There were no vitals taken for this visit. , BMI There is no height or weight on file to calculate BMI.  Wt Readings from Last 3 Encounters:  08/14/23 220 lb (99.8 kg)  07/18/23 219 lb 9.6 oz (99.6 kg)  07/16/23 219 lb (99.3 kg)   GEN: Obese, 78 y.o. female in no acute distress. HEENT: normal. Neck: Supple, no JVD, carotid bruits, or masses. Cardiac: S1/S2, irregularly irregular rhythm, no murmurs, rubs, or gallops. No clubbing, cyanosis, no edema to BLE.  Radials/PT 2+ and equal bilaterally.  Respiratory:  Respirations regular and unlabored, clear to auscultation bilaterally. MS: No deformity or atrophy. Skin: Warm and dry, no rash. Neuro:  Strength and sensation are intact. Psych: Normal affect.  Assessment & Plan    Orthostatic hypotension Past positive for orthostatics at previous office visit.  Denies any syncope or falls since last office visit. Dizziness has been more noticeable recently. Instructed to increase midodrine  to 2.5 mg TID.   Previously given Rx for Omron cuff sent to requested pharmacy. Heart healthy diet encouraged. Care and ED precautions discussed.   HFmrEF, DOE Stage C, NYHA class II-III symptoms. EF 10/2022 45-50%.  Weight is stable.  No signs of JVD or signs of volume overload on exam, difficult to ascertain fluid status d/t patient's BMI, not in acute distress. DOE etiology multifactorial, attributes this to her smoking habit. GDMT limited d/t BP and history of orthostatic hypotension.   Will switch Lasix  to as needed daily dosing for leg swelling, worsening shortness of breath, or weight gain.  She verbalized understanding.  Low sodium diet, fluid restriction <2L, and daily weights encouraged. Educated to contact our office for weight gain of 2 lbs overnight or 5 lbs in one week. ED precautions discussed. No edema noted on exam. Previously prescribed Rx for compression stockings.  Multivessel CAD, s/p CABG in 1995, chest pain of uncertain etiology Admits to chest heaviness, pt is a difficult historian regarding this.  Underwent right and left heart cath in 2022 that revealed severe underlying three-vessel CAD, patent LIMA to LAD, chronically occluded SVG to OM and SVG to diagonal.  See heart cath report noted above.  Right heart cath showed high normal filling pressures, no pulmonary hypertension, normal cardiac output.  Was recommended at the time to continue medical therapy.  EKG today shows rate controlled A-fib, ST/T wave abnormalities, no significant change from his previous EKG. Continue current medication regimen. Heart healthy diet encouraged. ED precautions discussed.   HLD, hx of mesenteric ischemia, PAD She is status post right hemicolectomy and right iliac to SMA bypass performed at [facility] in 2021.  Carotid duplex performed recently revealed mild 1 to 39% stenosis along bilateral ICAs, right subclavian artery flow was disturbed. Past LDL 59. Continue current medication regimen. Heart healthy diet and regular cardiovascular exercise encouraged.  Care and ED precautions discussed.  HTN Blood pressure soft, previous orthostatics positive.  Symptomatic with this.  Discussed to monitor BP at home at least 2 hours after medications and sitting for 5-10 minutes.  Adjusting medication as noted above.  Heart healthy diet encouraged.   Atypical A-flutter, post-op A-fib, tachycardia/palpitations Does have previous hx of post-op A-fib and noted to have atypical A-flutter on ECG at [facility] 05/2022. Admits to recent tachycardia/palpitations, says it seems to be racing at times. HR well controlled today.  Continue Eliquis  5 mg BID, on appropriate dosage and denies any bleeding issues.  With her current BP trends and dizziness, requires better BP control prior to rate control.  Once BP is better controlled, plan to start low-dose Toprol  or atenolol.  Not a good candidate to start amiodarone  at this time.  May need to consider EP evaluation in the future.  6. Valvular insufficiency Past TTE revealed moderate TR, along with mild to moderate MR. Recommend updating Echo in 1-2 years or sooner if clinically indicated, around July 2025.  7. Aortic dilatation Mild dilatation of ascending aorta on TTE, measuring 41 mm. Recommend updating TTE in 1 year for  monitoring, around July 2025.   8. CKD stage 3 Most recent labs revealed stable kidney function. Encouraged adequate hydration.  Avoid nephrotoxic agents.  Continue to follow with PCP and Nephrology. Will request most recent labs from Dr. Carrolyn Clan.   5. Tobacco use Smoking cessation encouraged and discussed.     Disposition: Care and ED precautions discussed. Follow up in 3 weeks with Teddie Favre, MD or APP.  Signed, Lasalle Pointer, NP

## 2023-08-21 ENCOUNTER — Other Ambulatory Visit: Payer: Self-pay

## 2023-08-21 MED ORDER — ESTRADIOL 0.1 MG/GM VA CREA: 1.0000 | TOPICAL_CREAM | VAGINAL | 12 refills | Status: AC

## 2023-08-21 NOTE — Telephone Encounter (Signed)
 Patient is aware rx was sent to pharmacy

## 2023-08-27 ENCOUNTER — Other Ambulatory Visit: Payer: Self-pay | Admitting: Cardiology

## 2023-08-27 DIAGNOSIS — I4891 Unspecified atrial fibrillation: Secondary | ICD-10-CM

## 2023-08-28 NOTE — Telephone Encounter (Signed)
 Prescription refill request for Eliquis  received. Indication: Afib  Last office visit: 07/16/23 [name])  Scr: 1.61 (12/13/22)  Age: 78 Weight: 99.8kg  Appropriate dose. Refill sent.

## 2023-09-04 ENCOUNTER — Telehealth (HOSPITAL_BASED_OUTPATIENT_CLINIC_OR_DEPARTMENT_OTHER): Payer: Self-pay

## 2023-09-04 NOTE — Telephone Encounter (Signed)
 Can we check on this for pt?  Copied from CRM [id number]. Topic: Clinical - Prescription Issue >> Aug 28, 2023 11:11 AM Crist Dominion wrote: Reason for CRM: Patients granddaughter [name] is requesting a nurse to call [facility] and sort out what the issue is on why they still have not provided the patient with her CPAP machine. [name] states that [facility] told her the clinic needs to contact them. Please call [name] back when there is an  update on this matter at [name] (granddaughter) [phone] >> Sep 04, 2023  2:47 PM Hilton Lucky wrote: Granddaughter is calling in once more. States has not heard. Patient and insurance is paying for this CPAP machine monthly and still has not received the machine. Please follow up on previous CRM ASAP, as [facility] still needs an authorization from provider, where previous one has expired.

## 2023-09-05 NOTE — Telephone Encounter (Signed)
 [address], [place], [place]; [place], [place]; [place], [place]; [place], [place], [place]; 1 other We need an office visit stating she is still waiting for a new machine as it has been too long since her last visit. We tried to have chart notes amended on a different visit but it has not worked out, so we need a new f28f per [employer].

## 2023-09-05 NOTE — Telephone Encounter (Signed)
 Pt granddaughter notified of status. Can you get her in for a video visit I just talked to granddaughter and she is going to set up her My chart access I have sent a text to her to get this set up. She has been waiting on CPAP since 01/2023 and has already made a payment to [facility] for this

## 2023-09-07 ENCOUNTER — Other Ambulatory Visit: Payer: Self-pay | Admitting: Cardiology

## 2023-09-09 ENCOUNTER — Telehealth: Payer: Self-pay | Admitting: Primary Care

## 2023-09-09 NOTE — Telephone Encounter (Signed)
 Also, does she need a FU appt? If so when? Please address this with her.

## 2023-09-09 NOTE — Telephone Encounter (Signed)
 PT still has no CPAP. Grand daughter calling again. Has been going on since November she says. Granddaughter calling. Please call to advise @ [phone]

## 2023-09-09 NOTE — Telephone Encounter (Signed)
 Spoke with [name] and informed her she needed an OV to be able to get what she needs. Appt is 5/30 is aware. NFN

## 2023-09-12 ENCOUNTER — Other Ambulatory Visit: Payer: Self-pay | Admitting: Internal Medicine

## 2023-09-12 DIAGNOSIS — I25118 Atherosclerotic heart disease of native coronary artery with other forms of angina pectoris: Secondary | ICD-10-CM

## 2023-09-12 DIAGNOSIS — I739 Peripheral vascular disease, unspecified: Secondary | ICD-10-CM

## 2023-09-18 ENCOUNTER — Other Ambulatory Visit: Payer: Self-pay | Admitting: Cardiology

## 2023-09-18 ENCOUNTER — Telehealth: Payer: Self-pay

## 2023-09-18 NOTE — Telephone Encounter (Signed)
 Copied from CRM [id number]. Topic: Clinical - Prescription Issue >> Sep 18, 2023 11:57 AM Hobson Luna F wrote: Reason for CRM: Patient's granddaughter is calling in because they would like to discontinue using [facility] for medications. She wants them delivered through the pharmacy. She says she was told it was an automated thing and she needed to reach out to the office.

## 2023-09-18 NOTE — Telephone Encounter (Signed)
 [facility] removed from pharmacy list.

## 2023-09-19 ENCOUNTER — Ambulatory Visit: Admitting: Nurse Practitioner

## 2023-09-24 DIAGNOSIS — J449 Chronic obstructive pulmonary disease, unspecified: Secondary | ICD-10-CM | POA: Diagnosis not present

## 2023-09-24 DIAGNOSIS — G4733 Obstructive sleep apnea (adult) (pediatric): Secondary | ICD-10-CM | POA: Diagnosis not present

## 2023-09-26 ENCOUNTER — Telehealth: Payer: Self-pay

## 2023-09-26 NOTE — Telephone Encounter (Signed)
 Pt is scheduled to see Misty Mannan, NP tomorrow ( 09-27-23) for a f/u on CPAP. I was unable to find a download on [facility] regarding the CPAP so I called [facility] since the last order was stated to have been sent to them.   I called and spoke with Isa Manuel with [facility]. Isa Manuel stated that they do not see an order regarding the machine itself but see where they may start taking over the orders for the CPAP supplies for pt. Isa Manuel stated the machine may be from [place] and not their office.   I called and spoke with Burleigh Carp with [facility]. Kirsten stated that the pt needed an office visit with our office ( pulmonary) for a new face to face so [facility] could process the CPAP order for insurance purposes. Kirsten stated she told the pt's grand daughter this information and it was documented in April 2025. Kirsten states the pt does have to have a compliance check every 90 days to check. Kirsten states that if [employer] does not have proof of necessity ( an up to date face to face) then insurance will not cover the CPAP and the DME will have to take it back. Kirsten stated that she told the pt's grand daughter, [name], of this in April. Kirsten also stated that they have tried speaking to the pt directly but [name] only allows someone to speak to her and not the pt directly. Kirsten states that [place] told her that she spoke to a guy named Ron about the machine, but Burleigh Carp stated she has never heard of a Ron.   I called and spoke to Pt's grand daughter, [name]. ( DPR) [name] states that the pt does not have a CPAP yet and she spoke to a guy named Ron through [place]. [name] stated the pt already made a payment to [facility] and still has not received the machine and now has ran into an issue that insurance is not going to cover the machine because it does not show that she is using it. [name] stated that Ron was [employer] and sending texts with links on how to measure the pt's face. [name] stated that  Ron is with actually with [employer]. [name] stated that [facility] did not take pt's insurance.   I called [employer] and spoke with Mitch. I informed Harriet Limber of my conversation with [name]. Mitch asked if I would email him the pt's information so he could look into this as he was currently driving and he would pull over to look into it. Email was sent. Now waiting for a response back from [place].

## 2023-09-26 NOTE — Telephone Encounter (Signed)
 I received an email back from [place] with [employer] at 3:44 pm along with a copy of the visual report. Per Mitch's email, the CPAP was shipped out today. I tried calling the pt's grad daughter, [name] but was unable to reach her. I also could not leave a vm due to mailbox being full. Pt does have an active Mychart account so I will inform the pt and [name] that way. If the CPAP is being shipped today, then the pt's scheduled appointment for tomorrow needs to be cancelled and rescheduled so the pt can use the machine at least 31 days or more to gain compliance. It can not be less than 31 days. It looks like the appointment was already cancelled. I will inform the pt through Mychart with the information for the CPAP and to have a new appointment made. NFN

## 2023-09-27 ENCOUNTER — Ambulatory Visit: Admitting: Primary Care

## 2023-09-27 DIAGNOSIS — R0902 Hypoxemia: Secondary | ICD-10-CM | POA: Diagnosis not present

## 2023-09-27 DIAGNOSIS — G4733 Obstructive sleep apnea (adult) (pediatric): Secondary | ICD-10-CM | POA: Diagnosis not present

## 2023-10-03 ENCOUNTER — Other Ambulatory Visit: Payer: Self-pay | Admitting: Cardiology

## 2023-10-19 ENCOUNTER — Other Ambulatory Visit: Payer: Self-pay | Admitting: Cardiology

## 2023-10-25 DIAGNOSIS — G4733 Obstructive sleep apnea (adult) (pediatric): Secondary | ICD-10-CM | POA: Diagnosis not present

## 2023-11-20 ENCOUNTER — Ambulatory Visit: Admitting: Internal Medicine

## 2023-11-20 ENCOUNTER — Encounter: Payer: Self-pay | Admitting: Internal Medicine

## 2023-11-20 VITALS — BP 89/62 | HR 71 | Ht 63.0 in | Wt 227.8 lb

## 2023-11-20 DIAGNOSIS — I5020 Unspecified systolic (congestive) heart failure: Secondary | ICD-10-CM | POA: Diagnosis not present

## 2023-11-20 DIAGNOSIS — E785 Hyperlipidemia, unspecified: Secondary | ICD-10-CM | POA: Diagnosis not present

## 2023-11-20 DIAGNOSIS — E039 Hypothyroidism, unspecified: Secondary | ICD-10-CM

## 2023-11-20 DIAGNOSIS — I959 Hypotension, unspecified: Secondary | ICD-10-CM

## 2023-11-20 DIAGNOSIS — I4821 Permanent atrial fibrillation: Secondary | ICD-10-CM | POA: Diagnosis not present

## 2023-11-20 DIAGNOSIS — Z7984 Long term (current) use of oral hypoglycemic drugs: Secondary | ICD-10-CM | POA: Diagnosis not present

## 2023-11-20 DIAGNOSIS — F411 Generalized anxiety disorder: Secondary | ICD-10-CM

## 2023-11-20 DIAGNOSIS — E1122 Type 2 diabetes mellitus with diabetic chronic kidney disease: Secondary | ICD-10-CM

## 2023-11-20 DIAGNOSIS — Z0001 Encounter for general adult medical examination with abnormal findings: Secondary | ICD-10-CM

## 2023-11-20 DIAGNOSIS — N1832 Chronic kidney disease, stage 3b: Secondary | ICD-10-CM | POA: Diagnosis not present

## 2023-11-20 NOTE — Patient Instructions (Addendum)
 Please take Lasix  half tablet once daily and take Midodrine  3 times daily until Cardiology appointment.  Please take Ambien  for insomnia.  Please continue to take medications as prescribed.  Please continue to follow low salt diet and ambulate as tolerated.  Please consider getting Shingrix and Tdap vaccine at local pharmacy.

## 2023-11-20 NOTE — Assessment & Plan Note (Signed)
 Takes Xanax, sometimes twice daily - would avoid increasing dose of Xanax for now as she is also on Ambien On Celexa for MDD and anxiety On Wellbutrin for smoking cessation, plan to discontinue later Patient also takes Ambien for insomnia. She lives alone and could be a reason for her anxiety as well

## 2023-11-20 NOTE — Assessment & Plan Note (Signed)
 Lab Results  Component Value Date   TSH 2.140 11/15/2022   On Levothyroxine 50 mcg - advised to take it regularly Check TSH and free T4 later, adjust dose accordingly

## 2023-11-20 NOTE — Assessment & Plan Note (Signed)
 On Eliquis now, stopped Plavix Currently rate controlled,  metoprolol was discontinued by Cardiology as she has hypotensive spells Followed by Cardiology

## 2023-11-20 NOTE — Assessment & Plan Note (Signed)
 Last echo (02/22) showed LVEF of 45 to 50% She has chronic leg swelling, but has improved now Takes Furosemide  40 mg once daily, advised to take 20 mg for now due to dizziness and hypotension On Midodrine  for hypotension now

## 2023-11-20 NOTE — Progress Notes (Signed)
 Established Patient Office Visit  Subjective:  Patient ID: [name], female    DOB: [birth year]  Age: 78 y.o. MRN: [id number]  CC:  Chief Complaint  Patient presents with   Annual Exam    Annual exam/ 4 month f/u     HPI [name] is a 78 y.o. female with past medical history of hypertension, hyperlipidemia, mesenteric ischemia status post right hemicolectomy, coronary artery disease, hypothyroidism, type 2 diabetes mellitus, depression with anxiety, obesity, arthritis, chronic back pain who presents for f/u of her chronic medical conditions.  HTN and A-fib: BP is still borderline low today. She takes Midodrine  2.5 mg BID. Her valsartan  was discontinued in the last visit. She still reports dizziness with position change.  She still takes Lasix  40 mg once daily instead of PRN as advised by Cardiology.  She is on Eliquis  and Aspirin . She has easy bruising.  Denies any chest pain or palpitations currently.  She has chronic exertional dyspnea.  She takes Imdur  15 mg QD for angina.  She has been placed on Farxiga  for CHF and CKD.  MDD and GAD: She had anhedonia, insomnia, lack of interest in routine activities and agitation, but is improving slowly. Denies any SI or HI. She was placed on Celexa , which has helped her with MDD. She takes Xanax  for anxiety and takes Ambien  PRN for insomnia.  She also takes Wellbutrin , mainly for smoking cessation, and has cut down smoking up to 4-5 cigarettes per day.  COPD: She has been using Stiolto and as needed albuterol , but does not use it regularly.  She uses home O2 at nighttime for dyspnea and hypoxia (O2 sat less than 88%). Denies any fever or chills. C/o chronic nasal congestion and postnasal drip.    Past Medical History:  Diagnosis Date   Acute ischemic colitis (HCC) 09/11/2005   Anxiety    Arthritis    Atherosclerotic vascular disease    Calcified plaque at the origin of the celiac and SMA   CHF (congestive heart failure) (HCC)     Coronary atherosclerosis of native coronary artery    Mulitvessel LVEF 50-50%, DES circ 1/11, occluded SVG to OM and SVG to diagonal , LVEF 60%   COVID-19 virus infection 11/07/2020   Depression    Diverticulosis of colon    Essential hypertension    Hypothyroidism    Mesenteric ischemia (HCC)    Mitral regurgitation    Mild to moderate   Mixed hyperlipidemia    Myocardial infarction (HCC) 1990   Obesity    Orthostatic hypotension    PAD (peripheral artery disease) (HCC)    PVC's (premature ventricular contractions)    Schizophrenia (HCC)    Sleep apnea    CPAP   TIA (transient ischemic attack) 11/20/2017   Tubular adenoma    Type 2 diabetes mellitus (HCC)    Vascular complications of mesenteric artery     Past Surgical History:  Procedure Laterality Date   BACK SURGERY     Lumbar spine surgery   Carotid endarectomy Bilateral    CARPAL TUNNEL RELEASE     Bilateral   CATARACT EXTRACTION W/PHACO Right 08/24/2013   Procedure: CATARACT EXTRACTION PHACO AND INTRAOCULAR LENS PLACEMENT (IOC);  Surgeon: Cherene Mania, MD;  Location: [facility];  Service: Ophthalmology;  Laterality: Right;  CDE 8.68   COLONOSCOPY  09/14/2005   Dr. Verdel colitis   COLONOSCOPY WITH ESOPHAGOGASTRODUODENOSCOPY (EGD)  04/14/2012   Dr. Shaaron- EGD= gastric erosions of doubtful clinical significance per  bx- chronic inactive gastritis, benign small bowel mucosa. TCS=colonic diverticulosis, tubular adenoma   CORONARY ARTERY BYPASS GRAFT     LIMA-LAD; SVG-OM; SVG-DX in [facility]   NECK SURGERY     Cervical laminectomy   PARTIAL HYSTERECTOMY     RIGHT/LEFT HEART CATH AND CORONARY/GRAFT ANGIOGRAPHY N/A 04/12/2021   Procedure: RIGHT/LEFT HEART CATH AND CORONARY/GRAFT ANGIOGRAPHY;  Surgeon: Darron Deatrice LABOR, MD;  Location: [facility];  Service: Cardiovascular;  Laterality: N/A;   stents x4      Family History  Problem Relation Age of Onset   Alcohol abuse Mother    Alcohol abuse Father     Coronary artery disease Other    Hypertension Other    Crohn's disease Sister 22   Stroke Daughter     Social History   Socioeconomic History   Marital status: Widowed    Spouse name: Not on file   Number of children: 4   Years of education: Not on file   Highest education level: Not on file  Occupational History   Occupation: DISABLED    Employer: UNEMPLOYED  Tobacco Use   Smoking status: Every Day    Current packs/day: 0.50    Average packs/day: 0.5 packs/day for 51.0 years (25.5 ttl pk-yrs)    Types: Cigarettes    Start date: 12/30/2018    Passive exposure: Never   Smokeless tobacco: Never  Vaping Use   Vaping status: Never Used  Substance and Sexual Activity   Alcohol use: Yes    Alcohol/week: 0.0 standard drinks of alcohol    Comment: occasionally    Drug use: No   Sexual activity: Not Currently  Other Topics Concern   Not on file  Social History Narrative   LIves alone   Social Drivers of Health   Financial Resource Strain: Low Risk  (08/14/2023)   Overall Financial Resource Strain (CARDIA)    Difficulty of Paying Living Expenses: Not hard at all  Food Insecurity: No Food Insecurity (08/14/2023)   Hunger Vital Sign    Worried About Running Out of Food in the Last Year: Never true    Ran Out of Food in the Last Year: Never true  Transportation Needs: No Transportation Needs (08/14/2023)   PRAPARE - Administrator, Civil Service (Medical): No    Lack of Transportation (Non-Medical): No  Physical Activity: Insufficiently Active (08/14/2023)   Exercise Vital Sign    Days of Exercise per Week: 2 days    Minutes of Exercise per Session: 20 min  Stress: No Stress Concern Present (08/14/2023)   [employer] of Occupational Health - Occupational Stress Questionnaire    Feeling of Stress : Only a little  Social Connections: Socially Isolated (08/14/2023)   Social Connection and Isolation Panel    Frequency of Communication with Friends and Family: More  than three times a week    Frequency of Social Gatherings with Friends and Family: Three times a week    Attends Religious Services: Never    Active Member of Clubs or Organizations: No    Attends Banker Meetings: Never    Marital Status: Widowed  Intimate Partner Violence: Not At Risk (08/14/2023)   Humiliation, Afraid, Rape, and Kick questionnaire    Fear of Current or Ex-Partner: No    Emotionally Abused: No    Physically Abused: No    Sexually Abused: No    Outpatient Medications Prior to Visit  Medication Sig Dispense Refill   albuterol  (VENTOLIN   HFA) 108 (90 Base) MCG/ACT inhaler Inhale 1 puff into the lungs every 6 (six) hours as needed for wheezing or shortness of breath. 18 g 2   alfuzosin  (UROXATRAL ) 10 MG 24 hr tablet Take 1 tablet (10 mg total) by mouth at bedtime. 30 tablet 11   ALPRAZolam  (XANAX ) 0.5 MG tablet Take 1 tablet (0.5 mg total) by mouth 2 (two) times daily as needed. for anxiety 60 tablet 3   apixaban  (ELIQUIS ) 5 MG TABS tablet TAKE 1 TABLET BY MOUTH TWICE  DAILY 200 tablet 1   aspirin  EC 81 MG tablet Take 81 mg by mouth at bedtime.     Blood Pressure Monitoring (OMRON 3 SERIES BP MONITOR) DEVI Use a directed 1 each 1   buPROPion  (WELLBUTRIN ) 75 MG tablet Take 1 tablet (75 mg total) by mouth 2 (two) times daily. 60 tablet 5   calcitRIOL  (ROCALTROL ) 0.25 MCG capsule Take 1 capsule (0.25 mcg total) by mouth 2 (two) times a week. 30 capsule 0   citalopram  (CELEXA ) 20 MG tablet Take 1 tablet (20 mg total) by mouth daily. 90 tablet 1   clotrimazole -betamethasone  (LOTRISONE ) cream Apply 1 Application topically daily. 30 g 0   estradiol  (ESTRACE ) 0.1 MG/GM vaginal cream Place 1 Applicatorful vaginally 3 (three) times a week. 42.5 g 12   FARXIGA  5 MG TABS tablet TAKE 1 TABLET BY MOUTH DAILY 100 tablet 2   furosemide  (LASIX ) 40 MG tablet TAKE 1 TABLET BY MOUTH DAILY 100 tablet 2   ipratropium-albuterol  (DUONEB) 0.5-2.5 (3) MG/3ML SOLN Take 3 mLs by  nebulization 4 times daily and as needed for shortness of breath or wheezing. 450 mL 11   isosorbide  mononitrate (IMDUR ) 30 MG 24 hr tablet TAKE ONE-HALF TABLET BY MOUTH  DAILY 45 tablet 3   levothyroxine  (SYNTHROID ) 50 MCG tablet Take 1 tablet (50 mcg total) by mouth daily before breakfast. 90 tablet 3   midodrine  (PROAMATINE ) 2.5 MG tablet TAKE 1 TABLET BY MOUTH TWICE  DAILY WITH MEALS 180 tablet 2   nitroGLYCERIN  (NITROSTAT ) 0.4 MG SL tablet DISSOLVE 1 TABLET UNDER THE  TONGUE EVERY 5 MINUTES AS NEEDED FOR CHEST PAIN. MAX OF 3 TABLETS IN 15 MINUTES. CALL 911 IF PAIN  PERSISTS. 100 tablet 3   nystatin  (MYCOSTATIN /NYSTOP ) powder Apply 1 Application topically 3 (three) times daily. 15 g 0   pravastatin  (PRAVACHOL ) 80 MG tablet TAKE 1 TABLET BY MOUTH DAILY 100 tablet 2   Tiotropium Bromide -Olodaterol (STIOLTO RESPIMAT ) 2.5-2.5 MCG/ACT AERS Inhale 2 puffs into the lungs daily. 4 g 11   zolpidem  (AMBIEN ) 10 MG tablet TAKE 1 TABLET BY MOUTH AT BEDTIME AS NEEDED FOR SLEEP 30 tablet 3   No facility-administered medications prior to visit.    No Known Allergies  ROS Review of Systems  Constitutional:  Negative for chills and fever.  HENT:  Negative for congestion, sinus pressure and sinus pain.   Eyes:  Negative for pain and discharge.  Respiratory:  Positive for cough (chronic) and shortness of breath.   Cardiovascular:  Positive for leg swelling. Negative for chest pain and palpitations.  Gastrointestinal:  Positive for nausea. Negative for diarrhea and vomiting.  Genitourinary:  Negative for dysuria and hematuria.  Musculoskeletal:  Positive for arthralgias, back pain, gait problem and joint swelling.  Skin:  Negative for rash.       Bruising over UE and LE  Neurological:  Positive for dizziness and weakness. Negative for headaches.  Hematological:  Bruises/bleeds easily.  Psychiatric/Behavioral:  Positive for agitation  and sleep disturbance. Negative for behavioral problems. The patient is  nervous/anxious.       Objective:    Physical Exam Vitals reviewed.  Constitutional:      General: She is not in acute distress.    Appearance: She is obese. She is not diaphoretic.     Comments: In wheelchair  HENT:     Head: Normocephalic and atraumatic.     Nose: Congestion present.     Mouth/Throat:     Mouth: Mucous membranes are moist.     Pharynx: No posterior oropharyngeal erythema.  Eyes:     General: No scleral icterus.    Extraocular Movements: Extraocular movements intact.  Cardiovascular:     Rate and Rhythm: Normal rate. Rhythm irregular.     Heart sounds: Normal heart sounds. No murmur heard. Pulmonary:     Breath sounds: No wheezing or rales.  Abdominal:     Palpations: Abdomen is soft.     Tenderness: There is no abdominal tenderness.  Musculoskeletal:     Cervical back: Normal range of motion and neck supple. No tenderness.     Right lower leg: No edema.     Left lower leg: No edema.  Skin:    General: Skin is warm.     Findings: Bruising (over b/l UE and LE) present. No rash.     Comments: Multiple whitish skin eruptions over b/l UE and back -likely actinic keratosis  Neurological:     General: No focal deficit present.     Mental Status: She is alert and oriented to person, place, and time.     Sensory: No sensory deficit.     Motor: No weakness.     Gait: Gait abnormal (Likely in the setting of baseline hip pain).  Psychiatric:        Mood and Affect: Mood is anxious.        Behavior: Behavior normal.     BP (!) 89/62   Pulse 71   Ht 5' 3 (1.6 m)   Wt 227 lb 12.8 oz (103.3 kg)   SpO2 93%   BMI 40.35 kg/m  Wt Readings from Last 3 Encounters:  11/20/23 227 lb 12.8 oz (103.3 kg)  08/14/23 220 lb (99.8 kg)  07/18/23 219 lb 9.6 oz (99.6 kg)    Lab Results  Component Value Date   TSH 2.140 11/15/2022   Lab Results  Component Value Date   WBC 6.9 12/13/2022   HGB 15.5 12/13/2022   HCT 47.2 (H) 12/13/2022   MCV 99 (H) 12/13/2022    PLT 239 12/13/2022   Lab Results  Component Value Date   NA 138 12/13/2022   K 4.5 12/13/2022   CO2 25 12/13/2022   GLUCOSE 147 (H) 12/13/2022   BUN 26 12/13/2022   CREATININE 1.61 (H) 12/13/2022   BILITOT 0.5 11/15/2022   ALKPHOS 61 11/15/2022   AST 17 11/15/2022   ALT 19 11/15/2022   PROT 6.7 11/15/2022   ALBUMIN 4.0 11/15/2022   CALCIUM 9.2 12/13/2022   ANIONGAP 9 03/13/2021   EGFR 38.0 02/19/2023   Lab Results  Component Value Date   CHOL 131 11/15/2022   Lab Results  Component Value Date   HDL 55 11/15/2022   Lab Results  Component Value Date   LDLCALC 59 11/15/2022   Lab Results  Component Value Date   TRIG 91 11/15/2022   Lab Results  Component Value Date   CHOLHDL 2.4 11/15/2022   Lab Results  Component  Value Date   HGBA1C 6.6 02/19/2023      Assessment & Plan:   Problem List Items Addressed This Visit       Cardiovascular and Mediastinum   Atrial fibrillation (HCC)   On Eliquis  now, stopped Plavix  Currently rate controlled,  metoprolol  was discontinued by Cardiology as she has hypotensive spells Followed by Cardiology      HFrEF (heart failure with reduced ejection fraction) (HCC)   Last echo (02/22) showed LVEF of 45 to 50% She has chronic leg swelling, but has improved now Takes Furosemide  40 mg once daily, advised to take 20 mg for now due to dizziness and hypotension On Midodrine  for hypotension now      Relevant Orders   CMP14+EGFR   CBC with Differential/Platelet   B Nat Peptide   Hypotension   Dizziness likely due to hypotension Advised to take Lasix  20 mg once daily for now instead of 40 mg dose Hold Alfuzosin  for now Advised to take Midodrine  2.5 mg TID instead of BID        Endocrine   DM2 (diabetes mellitus, type 2) with CKD    Lab Results  Component Value Date   HGBA1C 6.6 02/19/2023   Well controlled On Farxiga  for CHF now Avoid tighter control in her case due to risk of hypoglycemia related fall Has CKD,  followed by Nephrology On statin      Relevant Orders   Hemoglobin A1c   CMP14+EGFR   Hypothyroidism   Lab Results  Component Value Date   TSH 2.140 11/15/2022   On Levothyroxine  50 mcg - advised to take it regularly Check TSH and free T4 later, adjust dose accordingly      Relevant Orders   TSH + free T4     Other   GAD (generalized anxiety disorder) (Chronic)   Takes Xanax , sometimes twice daily - would avoid increasing dose of Xanax  for now as she is also on Ambien  On Celexa  for MDD and anxiety On Wellbutrin  for smoking cessation, plan to discontinue later Patient also takes Ambien  for insomnia. She lives alone and could be a reason for her anxiety as well      HLD (hyperlipidemia)   On pravastatin , needs to be compliant      Relevant Orders   Lipid panel   Encounter for general adult medical examination with abnormal findings - Primary   Physical exam as documented. Fasting blood tests today. Advised to get Shingrix and Tdap vaccines at local pharmacy.        No orders of the defined types were placed in this encounter.   Follow-up: Return in about 4 months (around 03/22/2024) for Hypothyroidism and GAD.    Suzzane MARLA Blanch, MD

## 2023-11-20 NOTE — Assessment & Plan Note (Signed)
  Lab Results  Component Value Date   HGBA1C 6.6 02/19/2023   Well controlled On Farxiga for CHF now Avoid tighter control in her case due to risk of hypoglycemia related fall Has CKD, followed by Nephrology On statin

## 2023-11-20 NOTE — Assessment & Plan Note (Signed)
Physical exam as documented. Fasting blood tests today. Advised to get Shingrix and Tdap vaccines at local pharmacy. 

## 2023-11-20 NOTE — Assessment & Plan Note (Signed)
 Dizziness likely due to hypotension Advised to take Lasix  20 mg once daily for now instead of 40 mg dose Hold Alfuzosin  for now Advised to take Midodrine  2.5 mg TID instead of BID

## 2023-11-20 NOTE — Assessment & Plan Note (Signed)
On pravastatin, needs to be compliant

## 2023-11-21 ENCOUNTER — Ambulatory Visit: Payer: Self-pay | Admitting: Internal Medicine

## 2023-11-22 LAB — CBC WITH DIFFERENTIAL/PLATELET
Basophils Absolute: 0 x10E3/uL (ref 0.0–0.2)
Basos: 1 %
EOS (ABSOLUTE): 0.1 x10E3/uL (ref 0.0–0.4)
Eos: 2 %
Hematocrit: 39.9 % (ref 34.0–46.6)
Hemoglobin: 13.2 g/dL (ref 11.1–15.9)
Immature Grans (Abs): 0 x10E3/uL (ref 0.0–0.1)
Immature Granulocytes: 0 %
Lymphocytes Absolute: 1.6 x10E3/uL (ref 0.7–3.1)
Lymphs: 29 %
MCH: 33.5 pg — ABNORMAL HIGH (ref 26.6–33.0)
MCHC: 33.1 g/dL (ref 31.5–35.7)
MCV: 101 fL — ABNORMAL HIGH (ref 79–97)
Monocytes Absolute: 0.7 x10E3/uL (ref 0.1–0.9)
Monocytes: 12 %
Neutrophils Absolute: 3.1 x10E3/uL (ref 1.4–7.0)
Neutrophils: 56 %
Platelets: 167 x10E3/uL (ref 150–450)
RBC: 3.94 x10E6/uL (ref 3.77–5.28)
RDW: 13 % (ref 11.7–15.4)
WBC: 5.5 x10E3/uL (ref 3.4–10.8)

## 2023-11-22 LAB — TSH+FREE T4
Free T4: 1.15 ng/dL (ref 0.82–1.77)
TSH: 2.62 u[IU]/mL (ref 0.450–4.500)

## 2023-11-22 LAB — CMP14+EGFR
ALT: 11 IU/L (ref 0–32)
AST: 15 IU/L (ref 0–40)
Albumin: 3.7 g/dL — ABNORMAL LOW (ref 3.8–4.8)
Alkaline Phosphatase: 89 IU/L (ref 44–121)
BUN/Creatinine Ratio: 10 — ABNORMAL LOW (ref 12–28)
BUN: 12 mg/dL (ref 8–27)
Bilirubin Total: 0.5 mg/dL (ref 0.0–1.2)
CO2: 25 mmol/L (ref 20–29)
Calcium: 9 mg/dL (ref 8.7–10.3)
Chloride: 99 mmol/L (ref 96–106)
Creatinine, Ser: 1.18 mg/dL — ABNORMAL HIGH (ref 0.57–1.00)
Globulin, Total: 2.7 g/dL (ref 1.5–4.5)
Glucose: 104 mg/dL — ABNORMAL HIGH (ref 70–99)
Potassium: 3.2 mmol/L — ABNORMAL LOW (ref 3.5–5.2)
Sodium: 141 mmol/L (ref 134–144)
Total Protein: 6.4 g/dL (ref 6.0–8.5)
eGFR: 47 mL/min/1.73 — ABNORMAL LOW (ref >59)

## 2023-11-22 LAB — LIPID PANEL
Chol/HDL Ratio: 2.5 ratio (ref 0.0–4.4)
Cholesterol, Total: 124 mg/dL (ref 100–199)
HDL: 49 mg/dL (ref >39)
LDL Chol Calc (NIH): 55 mg/dL (ref 0–99)
Triglycerides: 107 mg/dL (ref 0–149)
VLDL Cholesterol Cal: 20 mg/dL (ref 5–40)

## 2023-11-22 LAB — BRAIN NATRIURETIC PEPTIDE: BNP: 241.4 pg/mL — AB (ref 0.0–100.0)

## 2023-11-22 LAB — HEMOGLOBIN A1C
Est. average glucose Bld gHb Est-mCnc: 143 mg/dL
Hgb A1c MFr Bld: 6.6 % — ABNORMAL HIGH (ref 4.8–5.6)

## 2023-11-24 DIAGNOSIS — G4733 Obstructive sleep apnea (adult) (pediatric): Secondary | ICD-10-CM | POA: Diagnosis not present

## 2023-11-27 DIAGNOSIS — G4733 Obstructive sleep apnea (adult) (pediatric): Secondary | ICD-10-CM | POA: Diagnosis not present

## 2023-11-27 DIAGNOSIS — R0902 Hypoxemia: Secondary | ICD-10-CM | POA: Diagnosis not present

## 2023-11-28 ENCOUNTER — Ambulatory Visit: Attending: Nurse Practitioner | Admitting: Nurse Practitioner

## 2023-11-28 ENCOUNTER — Encounter: Payer: Self-pay | Admitting: Nurse Practitioner

## 2023-11-28 VITALS — BP 118/68 | HR 77 | Ht 63.0 in | Wt 226.4 lb

## 2023-11-28 DIAGNOSIS — R000 Tachycardia, unspecified: Secondary | ICD-10-CM

## 2023-11-28 DIAGNOSIS — I5020 Unspecified systolic (congestive) heart failure: Secondary | ICD-10-CM

## 2023-11-28 DIAGNOSIS — K559 Vascular disorder of intestine, unspecified: Secondary | ICD-10-CM | POA: Diagnosis not present

## 2023-11-28 DIAGNOSIS — E876 Hypokalemia: Secondary | ICD-10-CM | POA: Diagnosis not present

## 2023-11-28 DIAGNOSIS — I77819 Aortic ectasia, unspecified site: Secondary | ICD-10-CM

## 2023-11-28 DIAGNOSIS — I4891 Unspecified atrial fibrillation: Secondary | ICD-10-CM | POA: Diagnosis not present

## 2023-11-28 DIAGNOSIS — R0609 Other forms of dyspnea: Secondary | ICD-10-CM | POA: Diagnosis not present

## 2023-11-28 DIAGNOSIS — I739 Peripheral vascular disease, unspecified: Secondary | ICD-10-CM | POA: Diagnosis not present

## 2023-11-28 DIAGNOSIS — I951 Orthostatic hypotension: Secondary | ICD-10-CM

## 2023-11-28 DIAGNOSIS — I2510 Atherosclerotic heart disease of native coronary artery without angina pectoris: Secondary | ICD-10-CM

## 2023-11-28 DIAGNOSIS — R002 Palpitations: Secondary | ICD-10-CM

## 2023-11-28 DIAGNOSIS — I38 Endocarditis, valve unspecified: Secondary | ICD-10-CM

## 2023-11-28 DIAGNOSIS — I484 Atypical atrial flutter: Secondary | ICD-10-CM | POA: Diagnosis not present

## 2023-11-28 DIAGNOSIS — I10 Essential (primary) hypertension: Secondary | ICD-10-CM | POA: Diagnosis not present

## 2023-11-28 DIAGNOSIS — E785 Hyperlipidemia, unspecified: Secondary | ICD-10-CM

## 2023-11-28 DIAGNOSIS — N1832 Chronic kidney disease, stage 3b: Secondary | ICD-10-CM

## 2023-11-28 DIAGNOSIS — I5022 Chronic systolic (congestive) heart failure: Secondary | ICD-10-CM

## 2023-11-28 DIAGNOSIS — N1831 Chronic kidney disease, stage 3a: Secondary | ICD-10-CM

## 2023-11-28 MED ORDER — FUROSEMIDE 20 MG PO TABS: ORAL_TABLET | ORAL | 0 refills | Status: AC

## 2023-11-28 MED ORDER — MIDODRINE HCL 5 MG PO TABS: 5.0000 mg | ORAL_TABLET | Freq: Two times a day (BID) | ORAL | 1 refills | Status: DC

## 2023-11-28 MED ORDER — POTASSIUM CHLORIDE ER 10 MEQ PO TBCR: EXTENDED_RELEASE_TABLET | ORAL | 0 refills | Status: AC

## 2023-11-28 NOTE — Patient Instructions (Addendum)
 Medication Instructions:  Your physician has recommended you make the following change in your medication:  Please Increase Lasix  to 40 Mg Twice daily for 3 days then reduce to 20 Mg daily  Please Increase Midodrine  to 5 Mg Three times daily  Please Start Potassium 10 MeQ twice daily for 3 days then reduce to 10 MeQ daily   Labwork: In 1-2 weeks at [facility]   Testing/Procedures: None   Follow-Up: Your physician recommends that you schedule a follow-up appointment in: 4-6 weeks   Any Other Special Instructions Will Be Listed Below (If Applicable).  If you need a refill on your cardiac medications before your next appointment, please call your pharmacy.

## 2023-11-28 NOTE — Progress Notes (Signed)
 Office Visit    Patient Name: [name] Date of Encounter: 11/28/2023 PCP:  Misty Suzzane POUR, MD [facility]  Cardiologist:  Misty Sierras, MD  Advanced Practice Provider:  No care team member to display Electrophysiologist:  None   Chief Complaint and HPI    [name] is a 78 y.o. female with a hx of atypical A-flutter, CAD, s/p CABG in 1995, history of past stenting, hypertension, mixed hyperlipidemia, HFmrEF, OSA on CPAP, schizophrenia, type 2 diabetes, history of TIA, postop A-fib, and history of mesenteric ischemia, presents today for follow-up.   Previous CV history includes CABG in 1995 (LIMA-LAD, SVG-diagonal, SVG-OM, and SVG-PDA).  Underwent cardiac catheterization in 2011, received DES to circumflex.  Cardiac catheterization in 2022 revealed occluded grafts, patency of LIMA-LAD.  Was noted to have moderate in-stent restenosis in circumflex/OM distribution, was recommended to be medically managed.  Myoview  in 2022 revealed normal EF.  Was evaluated at [facility] in February 2024 for near syncope.  She had this experience after taking NTG.  Due to her mild orthostasis, it was felt that she was somewhat dehydrated and was given IV fluids.  CT of the head negative.  Antihypertensive regimen was decreased.  Last seen by Dr. Sierras on July 16, 2022.  She noted feeling better, dizziness improved.  She did note feeling weak with low stamina, this had been a chronic issue.  EKG in office revealed A-fib with nonspecific ST changes.  CHA2DS2-VASc score was found to be 7, discussion of anticoagulation was discussed.  Plavix  was stopped and replaced with Eliquis  5 mg twice daily.  05/02/2023 - Presents to office for follow-up by herself. Doing better since I last saw her. Her dizziness has resolved since starting midodrine  2.5 mg BID. Wants to know if she really needs the carotid duplex study done. Does admit to palpitations/tachcyardia sensation.  Says when she does house chores, she gives out, says she recently checked her HR during one of these episodes and found her HR to be 110. Denies any chest pain, syncope, presyncope, dizziness, orthopnea, PND, swelling or significant weight changes, acute bleeding, or claudication. Continues to admit to dependent edema at times and occasional shortness of breath that she attributes partly to her smoking.   07/16/2023 -presents today for follow-up with her granddaughter.  Patient is not a reliable historian due to forgetfulness at times that has been noticed during patient interview.  Admits to palpitations and symptoms of A-fib, says her heart rate feels like it is racing all the time, says symptoms resolve when she is resting/relaxing.  Does admit to some dizziness and symptoms of presyncope, denies any syncopal episodes.  Admits to some episodes of chest heaviness, says it feels like something is laying on her chest.  Denies any specific triggers and denies any alleviating or aggravating factors.  Difficult for her to describe. Denies any shortness of breath, syncope, orthopnea, PND, swelling or significant weight changes, acute bleeding, or claudication.  11/28/2023 - Here for follow-up with her granddaughter.  She tells me she is feeling better than she previously did when I last saw her in the office.  Does admit to some shortness of breath with exertion and confirms to me she is currently taking midodrine  2.5 mg 3 times daily, continues to note some dizziness.  Has noticed some recent coughing after she started herself on the CPAP machine for her sleep apnea.  Granddaughter says she has been noticing some symptoms of A-fib and  has reported this to her. Denies any chest pain, shortness of breath, syncope, presyncope, orthopnea, PND, swelling or significant weight changes, acute bleeding, or claudication.  ROS: Negative.  See HPI.  EKGs/Labs/Other Studies Reviewed:   The following studies were reviewed  today:  EKG: EKG is not ordered today.     Carotid duplex 05/2023:  Summary:  Right Carotid: Velocities in the right ICA are consistent with a 1-39%  stenosis. Non-hemodynamically significant plaque <50% noted in the  CCA. The ECA appears >50% stenosed.   Left Carotid: Velocities in the left ICA are consistent with a 1-39%  stenosis. Non-hemodynamically significant plaque <50% noted in the  CCA. The ECA appears <50% stenosed.   Vertebrals:  Bilateral vertebral arteries demonstrate antegrade flow.  Subclavians: Right subclavian artery flow was disturbed. Normal flow hemodynamics were seen in the left subclavian artery.   Cardiac monitor 05/2023: ZIO monitor reviewed.  3 days, 1 hours analyzed.   Atrial fibrillation present throughout monitoring with heart rate ranging from 41 bpm up to 166 bpm and average heart rate 81 bpm. There were occasional PVCs representing 1.5% total beats and otherwise rare ventricular couplets. No pauses or high degree heart block.  Echo 10/2022:  1. Left ventricular ejection fraction, by estimation, is 45 to 50%. The  left ventricle has mildly decreased function. The left ventricle  demonstrates regional wall motion abnormalities (see scoring  diagram/findings for description). There is mild left  ventricular hypertrophy. Left ventricular diastolic parameters are  indeterminate.   2. Right ventricular systolic function is low normal. The right  ventricular size is normal. There is normal pulmonary artery systolic  pressure. The estimated right ventricular systolic pressure is 31.8 mmHg.   3. The mitral valve is degenerative. Mild to moderate mitral valve  regurgitation.   4. Tricuspid valve regurgitation is moderate.   5. The aortic valve is tricuspid. Aortic valve regurgitation is not  visualized. Aortic valve sclerosis is present, with no evidence of aortic  valve stenosis.   6. Aortic dilatation noted. There is mild dilatation of the ascending   aorta, measuring 41 mm.   7. The inferior vena cava is dilated in size with >50% respiratory  variability, suggesting right atrial pressure of 8 mmHg.   Comparison(s): Prior images reviewed side by side. LVEF stable in 45-50%  range. Normal estimated RVSP. Mild to moderate mitral and moderate  tricuspid regurgitation.  Right and left heart cath 03/2021:   Ost LM to Mid LM lesion is 40% stenosed.   1st Mrg lesion is 50% stenosed.   Prox Cx lesion is 50% stenosed.   Ost LAD to Prox LAD lesion is 90% stenosed.   Prox Cx to Mid Cx lesion is 99% stenosed.   Prox RCA to Mid RCA lesion is 100% stenosed.   Prox RCA lesion is 80% stenosed.   Origin lesion is 100% stenosed.   Origin to Prox Graft lesion is 100% stenosed.   LIMA graft was visualized by angiography and is normal in caliber.   1.  Severe underlying three-vessel coronary artery disease with patent LIMA to LAD.  Chronically occluded SVG to OM and SVG to diagonal.  The SVG to right PDA which was patent on most recent angiography in 2011 is now occluded.  Left circumflex stent from the proximal portion extending into OM1 is patent with moderate in-stent restenosis and subtotal occlusion of the AV groove left circumflex which was present before. 2.  Right heart catheterization showed high normal filling pressures, no  pulmonary hypertension and normal cardiac output.   Recommendations: The SVG to right PDA is now occluded which is new but the RCA has good left-to-right collaterals.  The AV groove left circumflex was subtotally occluded before and jailed by previous stent.  This could not be crossed with a wire before. Recommend continuing medical therapy.  Cardiac monitor 12/2020:  ZIO XT reviewed. 6 days, 8 hours analyzed. Predominant rhythm is sinus with prolonged PR interval and heart rate ranging from 39 bpm (early morning) up to 116 bpm and average heart rate 59 bpm. Rare PACs including couplets and triplets were noted representing  less than 1% total beats. Rare PVCs including couplets and triplets were noted representing less than 1% total beats. There were also limited episodes of ventricular bigeminy and trigeminy. Multiple episodes of SVT were noted, these were generally brief with the longest lasting only 13 beats, nonsustained. There were no ventricular arrhythmias. No significant pauses.  Myoview  12/2020:    The study is low risk.   No ST deviation was noted. The ECG was negative for ischemia.   LV perfusion is abnormal. Defect 1: There is a small defect with mild reduction in uptake present in the apical anterior location(s) that is fixed. There is normal wall motion in the defect area. Consistent with artifact caused by breast attenuation. Defect 2: There is a small defect with mild reduction in uptake present in the mid to basal inferior location(s) that is partially reversible. There is normal wall motion in the defect area. Consistent with ischemia.   Left ventricular function is normal. Nuclear stress EF: 61 %.   Breast attenuation artifact was present.   Low risk study with evidence of breast attenuation artifact and also mild mid to basal inferior ischemia, LVEF normal at 61%.  Carotid duplex 12/2020:  Summary:  Right Carotid: Velocities in the right ICA are consistent with a 1-39%  stenosis. The ECA appears >50% stenosed.   Left Carotid: Velocities in the left ICA are consistent with a 1-39%  stenosis.   Vertebrals: Left vertebral artery demonstrates antegrade flow. Right  atypical antegrade.  Subclavians: Right subclavian artery flow was disturbed. Normal flow hemodynamics were seen in the left subclavian artery.  Echo 05/2020:  IMPRESSIONS    1. Abnormal septal motion distal septal apical mid and basal inferior  wall hypokinesis . Left ventricular ejection fraction, by estimation, is  45 to 50%. The left ventricle has mildly decreased function. The left  ventricle has no regional wall motion   abnormalities. The left ventricular internal cavity size was mildly  dilated. There is moderate left ventricular hypertrophy. Left ventricular  diastolic parameters are consistent with Grade I diastolic dysfunction  (impaired relaxation).   2. Right ventricular systolic function is normal. The right ventricular  size is normal.   3. Left atrial size was mildly dilated.   4. The mitral valve is degenerative. Mild mitral valve regurgitation. No  evidence of mitral stenosis.   5. The aortic valve is tricuspid. Aortic valve regurgitation is not  visualized. Mild aortic valve sclerosis is present, with no evidence of  aortic valve stenosis.   6. The inferior vena cava is normal in size with greater than 50%  respiratory variability, suggesting right atrial pressure of 3 mmHg.    Review of Systems    All other systems reviewed and are otherwise negative except as noted above.  Physical Exam    VS:  BP 118/68   Pulse 77   Ht 5' 3 (  1.6 m)   Wt 226 lb 6.4 oz (102.7 kg)   SpO2 98%   BMI 40.10 kg/m  , BMI Body mass index is 40.1 kg/m.  Wt Readings from Last 3 Encounters:  11/28/23 226 lb 6.4 oz (102.7 kg)  11/20/23 227 lb 12.8 oz (103.3 kg)  08/14/23 220 lb (99.8 kg)   GEN: Obese, 78 y.o. female in no acute distress. HEENT: normal. Neck: Supple, no JVD, carotid bruits, or masses. Cardiac: S1/S2, irregularly irregular rhythm, no murmurs, rubs, or gallops. No clubbing, cyanosis, no edema to BLE.  Radials/PT 2+ and equal bilaterally.  Respiratory:  Respirations regular and unlabored, clear to auscultation bilaterally. MS: No deformity or atrophy. Skin: Warm and dry, no rash. Neuro:  Strength and sensation are intact. Psych: Normal affect.  Assessment & Plan    Orthostatic hypotension Past positive for orthostatics at previous office visit.  Denies any syncope or falls since last office visit. Does note some dizziness at times.  She confirms with me that she is taking midodrine   2.5 mg 3 times daily and will increase to 5 mg 3 times daily.  Previously given Rx for Omron cuff sent to requested pharmacy. Heart healthy diet encouraged. Care and ED precautions discussed.   HFmrEF, DOE, hypokalemia Stage C, NYHA class II-III symptoms. EF 10/2022 45-50%.  Weight is stable per her report.  No signs of JVD or signs of volume overload on exam, difficult to ascertain fluid status d/t patient's BMI, not in acute distress.  Does admit to some increased shortness of breath with exertion.  DOE etiology multifactorial, attributes this to her smoking habit. GDMT limited d/t BP and history of orthostatic hypotension.  Instructed her to take Lasix  20 mg twice daily x 3 days, then return to normal dosing and to take potassium supplement-potassium chloride  10 mill equivalent BID x 3 days, then return to take this 10 mEq daily.  Will obtain proBNP, BMET, magnesium  1 to 2 weeks after medication adjustments.  No other medication changes at this time.  She verbalized understanding.  Low sodium diet, fluid restriction <2L, and daily weights encouraged. Educated to contact our office for weight gain of 2 lbs overnight or 5 lbs in one week. ED precautions discussed. No edema noted on exam. Previously prescribed Rx for compression stockings.  Multivessel CAD, s/p CABG in 1995 Denies any recent chest pain. Underwent right and left heart cath in 2022 that revealed severe underlying three-vessel CAD, patent LIMA to LAD, chronically occluded SVG to OM and SVG to diagonal.  See heart cath report noted above.  Right heart cath showed high normal filling pressures, no pulmonary hypertension, normal cardiac output.  Was recommended at the time to continue medical therapy.  Continue current medication regimen. Heart healthy diet encouraged. ED precautions discussed.   HLD, hx of mesenteric ischemia, PAD She is status post right hemicolectomy and right iliac to SMA bypass performed at [facility] in 2021.  Carotid duplex  performed recently revealed mild 1 to 39% stenosis along bilateral ICAs, right subclavian artery flow was disturbed. Past LDL 59. Continue current medication regimen. Heart healthy diet and regular cardiovascular exercise encouraged.  Care and ED precautions discussed.  HTN Blood pressure soft, previous orthostatics positive.  Symptomatic with this.  Discussed to monitor BP at home at least 2 hours after medications and sitting for 5-10 minutes.  Adjusting medication as noted above.  Heart healthy diet encouraged.   Atypical A-flutter, post-op A-fib, tachycardia/palpitations Does have previous hx of post-op A-fib and  noted to have atypical A-flutter on ECG at [facility] 05/2022. Continues to admit to recent tachycardia/palpitations, says it seems to be racing at times. HR well controlled today.  Continue Eliquis  5 mg BID, on appropriate dosage and denies any bleeding issues.  With her current BP trends and dizziness, requires better BP control prior to rate control.  Once BP is better controlled, plan to start low-dose Toprol  or atenolol.  Not a good candidate to start amiodarone  at this time.  May need to consider EP evaluation in the future.  6. Valvular insufficiency Past TTE revealed moderate TR, along with mild to moderate MR. Not addressed, but at next office visit, plan to update Echo.   7. Aortic dilatation Mild dilatation of ascending aorta on TTE, measuring 41 mm. Recommend updating TTE in 1 year for monitoring, will plan to update this at next office visit.   8. CKD stage 3a Most recent labs revealed stable kidney function. Encouraged adequate hydration.  Avoid nephrotoxic agents.  Continue to follow with PCP and Nephrology.   5. Tobacco use Smoking cessation encouraged and discussed.     Disposition: Care and ED precautions discussed. Follow up in 4-6 weeks with Misty Sierras, MD or APP.  Signed, Almarie Crate, NP

## 2023-12-11 ENCOUNTER — Telehealth: Payer: Self-pay | Admitting: Nurse Practitioner

## 2023-12-11 NOTE — Telephone Encounter (Signed)
 Pt requesting a order for ILD blood work.

## 2023-12-12 ENCOUNTER — Telehealth: Payer: Self-pay

## 2023-12-12 ENCOUNTER — Other Ambulatory Visit: Payer: Self-pay | Admitting: Internal Medicine

## 2023-12-12 DIAGNOSIS — J449 Chronic obstructive pulmonary disease, unspecified: Secondary | ICD-10-CM

## 2023-12-12 DIAGNOSIS — I5020 Unspecified systolic (congestive) heart failure: Secondary | ICD-10-CM

## 2023-12-12 DIAGNOSIS — Z122 Encounter for screening for malignant neoplasm of respiratory organs: Secondary | ICD-10-CM

## 2023-12-12 NOTE — Telephone Encounter (Signed)
 Per Elizabeth's response:  I am not sure what she is referring to. I would reach out to her PCP to begin this process.   Thanks!    Best, Almarie Crate, NP  Advised granddaughter and she verbalized understanding.

## 2023-12-12 NOTE — Telephone Encounter (Signed)
 Copied from CRM [id number]. Topic: Clinical - Request for Lab/Test Order >> Dec 12, 2023  2:17 PM Rosaria BRAVO wrote: Reason for CRM: Pt's grandaughter called reporting that the patient wants orders for a lung screening. Please advise, pt is coughing and is concerned that she has lung disease. Wants ILD lung disease.   Best contact: [phone]

## 2023-12-13 DIAGNOSIS — J449 Chronic obstructive pulmonary disease, unspecified: Secondary | ICD-10-CM | POA: Diagnosis not present

## 2023-12-17 ENCOUNTER — Other Ambulatory Visit: Payer: Self-pay | Admitting: Internal Medicine

## 2023-12-17 DIAGNOSIS — K219 Gastro-esophageal reflux disease without esophagitis: Secondary | ICD-10-CM

## 2023-12-25 ENCOUNTER — Encounter: Payer: Self-pay | Admitting: Nurse Practitioner

## 2024-01-01 ENCOUNTER — Other Ambulatory Visit: Payer: Self-pay | Admitting: Internal Medicine

## 2024-01-01 ENCOUNTER — Ambulatory Visit (HOSPITAL_COMMUNITY)
Admission: RE | Admit: 2024-01-01 | Discharge: 2024-01-01 | Disposition: A | Discharge status: Home / Self Care | Source: Ambulatory Visit | Attending: Internal Medicine | Admitting: Internal Medicine

## 2024-01-01 DIAGNOSIS — R918 Other nonspecific abnormal finding of lung field: Secondary | ICD-10-CM | POA: Diagnosis not present

## 2024-01-01 DIAGNOSIS — R053 Chronic cough: Secondary | ICD-10-CM | POA: Diagnosis not present

## 2024-01-01 DIAGNOSIS — I5020 Unspecified systolic (congestive) heart failure: Secondary | ICD-10-CM | POA: Insufficient documentation

## 2024-01-01 DIAGNOSIS — N1832 Chronic kidney disease, stage 3b: Secondary | ICD-10-CM | POA: Diagnosis not present

## 2024-01-01 DIAGNOSIS — F411 Generalized anxiety disorder: Secondary | ICD-10-CM

## 2024-01-01 DIAGNOSIS — J449 Chronic obstructive pulmonary disease, unspecified: Secondary | ICD-10-CM | POA: Diagnosis not present

## 2024-01-01 DIAGNOSIS — J439 Emphysema, unspecified: Secondary | ICD-10-CM | POA: Diagnosis not present

## 2024-01-02 LAB — BASIC METABOLIC PANEL WITH GFR
BUN/Creatinine Ratio: 13 (ref 12–28)
BUN: 16 mg/dL (ref 8–27)
CO2: 21 mmol/L (ref 20–29)
Calcium: 9.3 mg/dL (ref 8.7–10.3)
Chloride: 105 mmol/L (ref 96–106)
Creatinine, Ser: 1.23 mg/dL — ABNORMAL HIGH (ref 0.57–1.00)
Glucose: 147 mg/dL — ABNORMAL HIGH (ref 70–99)
Potassium: 5 mmol/L (ref 3.5–5.2)
Sodium: 143 mmol/L (ref 134–144)
eGFR: 45 mL/min/{1.73_m2} — ABNORMAL LOW

## 2024-01-02 LAB — BRAIN NATRIURETIC PEPTIDE: BNP: 487.2 pg/mL — ABNORMAL HIGH (ref 0.0–100.0)

## 2024-01-02 LAB — MAGNESIUM: Magnesium: 1.8 mg/dL (ref 1.6–2.3)

## 2024-01-06 ENCOUNTER — Ambulatory Visit: Payer: Self-pay | Admitting: Internal Medicine

## 2024-01-06 ENCOUNTER — Telehealth: Payer: Self-pay

## 2024-01-06 NOTE — Telephone Encounter (Signed)
 Spoke to grandaughter advised it will be a few days before it gets reviewed.

## 2024-01-06 NOTE — Telephone Encounter (Signed)
 Copied from CRM [id number]. Topic: Clinical - Lab/Test Results >> Jan 06, 2024  4:02 PM Antwanette L wrote: Reason for CRM: Pt granddaughter lenis ) is calling for ct scan results from 9/3. Please contact kimberly at [phone] when results are ready

## 2024-01-07 ENCOUNTER — Other Ambulatory Visit: Payer: Self-pay | Admitting: Internal Medicine

## 2024-01-07 DIAGNOSIS — R911 Solitary pulmonary nodule: Secondary | ICD-10-CM | POA: Insufficient documentation

## 2024-01-07 DIAGNOSIS — J9611 Chronic respiratory failure with hypoxia: Secondary | ICD-10-CM

## 2024-01-07 DIAGNOSIS — J449 Chronic obstructive pulmonary disease, unspecified: Secondary | ICD-10-CM

## 2024-01-17 ENCOUNTER — Ambulatory Visit: Admitting: Nurse Practitioner

## 2024-01-21 DIAGNOSIS — D631 Anemia in chronic kidney disease: Secondary | ICD-10-CM | POA: Diagnosis not present

## 2024-01-21 DIAGNOSIS — R809 Proteinuria, unspecified: Secondary | ICD-10-CM | POA: Diagnosis not present

## 2024-01-21 DIAGNOSIS — E211 Secondary hyperparathyroidism, not elsewhere classified: Secondary | ICD-10-CM | POA: Diagnosis not present

## 2024-01-24 ENCOUNTER — Encounter: Payer: Self-pay | Admitting: Nurse Practitioner

## 2024-01-24 ENCOUNTER — Ambulatory Visit: Admitting: Nurse Practitioner

## 2024-01-24 ENCOUNTER — Ambulatory Visit: Attending: Nurse Practitioner | Admitting: Nurse Practitioner

## 2024-01-24 VITALS — BP 128/84 | HR 80 | Ht 63.0 in | Wt 227.8 lb

## 2024-01-24 DIAGNOSIS — K559 Vascular disorder of intestine, unspecified: Secondary | ICD-10-CM

## 2024-01-24 DIAGNOSIS — R000 Tachycardia, unspecified: Secondary | ICD-10-CM

## 2024-01-24 DIAGNOSIS — I5022 Chronic systolic (congestive) heart failure: Secondary | ICD-10-CM | POA: Diagnosis not present

## 2024-01-24 DIAGNOSIS — I10 Essential (primary) hypertension: Secondary | ICD-10-CM | POA: Diagnosis not present

## 2024-01-24 DIAGNOSIS — R0609 Other forms of dyspnea: Secondary | ICD-10-CM

## 2024-01-24 DIAGNOSIS — I484 Atypical atrial flutter: Secondary | ICD-10-CM

## 2024-01-24 DIAGNOSIS — R002 Palpitations: Secondary | ICD-10-CM | POA: Diagnosis not present

## 2024-01-24 DIAGNOSIS — I5020 Unspecified systolic (congestive) heart failure: Secondary | ICD-10-CM

## 2024-01-24 DIAGNOSIS — E785 Hyperlipidemia, unspecified: Secondary | ICD-10-CM

## 2024-01-24 DIAGNOSIS — I951 Orthostatic hypotension: Secondary | ICD-10-CM | POA: Diagnosis not present

## 2024-01-24 DIAGNOSIS — I4891 Unspecified atrial fibrillation: Secondary | ICD-10-CM | POA: Diagnosis not present

## 2024-01-24 DIAGNOSIS — N1831 Chronic kidney disease, stage 3a: Secondary | ICD-10-CM

## 2024-01-24 DIAGNOSIS — I739 Peripheral vascular disease, unspecified: Secondary | ICD-10-CM | POA: Diagnosis not present

## 2024-01-24 DIAGNOSIS — I2510 Atherosclerotic heart disease of native coronary artery without angina pectoris: Secondary | ICD-10-CM

## 2024-01-24 DIAGNOSIS — I77819 Aortic ectasia, unspecified site: Secondary | ICD-10-CM

## 2024-01-24 DIAGNOSIS — Z720 Tobacco use: Secondary | ICD-10-CM

## 2024-01-24 DIAGNOSIS — I38 Endocarditis, valve unspecified: Secondary | ICD-10-CM

## 2024-01-24 MED ORDER — MIDODRINE HCL 10 MG PO TABS: 10.0000 mg | ORAL_TABLET | Freq: Three times a day (TID) | ORAL | 6 refills | Status: AC

## 2024-01-24 NOTE — Progress Notes (Signed)
 Office Visit    Patient Name: [name] Date of Encounter: 01/24/2024 PCP:  Tobie Suzzane POUR, MD [facility]  Cardiologist:  Jayson Sierras, MD  Advanced Practice Provider:  No care team member to display Electrophysiologist:  None   Chief Complaint and HPI    [name] is a 78 y.o. female with a hx of atypical A-flutter, CAD, s/p CABG in 1995, history of past stenting, hypertension, mixed hyperlipidemia, HFmrEF, OSA on CPAP, schizophrenia, type 2 diabetes, history of TIA, postop A-fib, and history of mesenteric ischemia, presents today for follow-up.   Previous CV history includes CABG in 1995 (LIMA-LAD, SVG-diagonal, SVG-OM, and SVG-PDA).  Underwent cardiac catheterization in 2011, received DES to circumflex.  Cardiac catheterization in 2022 revealed occluded grafts, patency of LIMA-LAD.  Was noted to have moderate in-stent restenosis in circumflex/OM distribution, was recommended to be medically managed.  Myoview  in 2022 revealed normal EF.  Was evaluated at [facility] in February 2024 for near syncope.  She had this experience after taking NTG.  Due to her mild orthostasis, it was felt that she was somewhat dehydrated and was given IV fluids.  CT of the head negative.  Antihypertensive regimen was decreased.  Last seen by Dr. Sierras on July 16, 2022.  She noted feeling better, dizziness improved.  She did note feeling weak with low stamina, this had been a chronic issue.  EKG in office revealed A-fib with nonspecific ST changes.  CHA2DS2-VASc score was found to be 7, discussion of anticoagulation was discussed.  Plavix  was stopped and replaced with Eliquis  5 mg twice daily.  05/02/2023 - Presents to office for follow-up by herself. Doing better since I last saw her. Her dizziness has resolved since starting midodrine  2.5 mg BID. Wants to know if she really needs the carotid duplex study done. Does admit to palpitations/tachcyardia sensation.  Says when she does house chores, she gives out, says she recently checked her HR during one of these episodes and found her HR to be 110. Denies any chest pain, syncope, presyncope, dizziness, orthopnea, PND, swelling or significant weight changes, acute bleeding, or claudication. Continues to admit to dependent edema at times and occasional shortness of breath that she attributes partly to her smoking.   07/16/2023 -presents today for follow-up with her granddaughter.  Patient is not a reliable historian due to forgetfulness at times that has been noticed during patient interview.  Admits to palpitations and symptoms of A-fib, says her heart rate feels like it is racing all the time, says symptoms resolve when she is resting/relaxing.  Does admit to some dizziness and symptoms of presyncope, denies any syncopal episodes.  Admits to some episodes of chest heaviness, says it feels like something is laying on her chest.  Denies any specific triggers and denies any alleviating or aggravating factors.  Difficult for her to describe. Denies any shortness of breath, syncope, orthopnea, PND, swelling or significant weight changes, acute bleeding, or claudication.  11/28/2023 - Here for follow-up with her granddaughter.  She tells me she is feeling better than she previously did when I last saw her in the office.  Does admit to some shortness of breath with exertion and confirms to me she is currently taking midodrine  2.5 mg 3 times daily, continues to note some dizziness.  Has noticed some recent coughing after she started herself on the CPAP machine for her sleep apnea.  Granddaughter says she has been noticing some symptoms of A-fib and  has reported this to her. Denies any chest pain, shortness of breath, syncope, presyncope, orthopnea, PND, swelling or significant weight changes, acute bleeding, or claudication.  01/24/2024 -here for follow-up visit with her granddaughter.  She confirms to me that she continues to  feel episodes of her A-fib.  Difficult for her to describe and patient is a difficult historian due to some noticed forgetfulness.  She also tells me she might occasionally forget to take her Eliquis  at times.  Noticing that she tends to give out more easily.  Confirms to me that she is taking midodrine  5 mg 3 times daily.  Tells me she feels like it has not really made a difference with her symptoms compared to last office visit. Denies any worsening shortness of breath, syncope, presyncope, dizziness, orthopnea, PND, swelling or significant weight changes, acute bleeding, or claudication.  ROS: Negative.  See HPI.  EKGs/Labs/Other Studies Reviewed:   The following studies were reviewed today:  EKG: EKG is not ordered today.      Carotid duplex 05/2023:  Summary:  Right Carotid: Velocities in the right ICA are consistent with a 1-39%  stenosis. Non-hemodynamically significant plaque <50% noted in the  CCA. The ECA appears >50% stenosed.   Left Carotid: Velocities in the left ICA are consistent with a 1-39%  stenosis. Non-hemodynamically significant plaque <50% noted in the  CCA. The ECA appears <50% stenosed.   Vertebrals:  Bilateral vertebral arteries demonstrate antegrade flow.  Subclavians: Right subclavian artery flow was disturbed. Normal flow hemodynamics were seen in the left subclavian artery.   Cardiac monitor 05/2023: ZIO monitor reviewed.  3 days, 1 hours analyzed.   Atrial fibrillation present throughout monitoring with heart rate ranging from 41 bpm up to 166 bpm and average heart rate 81 bpm. There were occasional PVCs representing 1.5% total beats and otherwise rare ventricular couplets. No pauses or high degree heart block.  Echo 10/2022:  1. Left ventricular ejection fraction, by estimation, is 45 to 50%. The  left ventricle has mildly decreased function. The left ventricle  demonstrates regional wall motion abnormalities (see scoring  diagram/findings for  description). There is mild left  ventricular hypertrophy. Left ventricular diastolic parameters are  indeterminate.   2. Right ventricular systolic function is low normal. The right  ventricular size is normal. There is normal pulmonary artery systolic  pressure. The estimated right ventricular systolic pressure is 31.8 mmHg.   3. The mitral valve is degenerative. Mild to moderate mitral valve  regurgitation.   4. Tricuspid valve regurgitation is moderate.   5. The aortic valve is tricuspid. Aortic valve regurgitation is not  visualized. Aortic valve sclerosis is present, with no evidence of aortic  valve stenosis.   6. Aortic dilatation noted. There is mild dilatation of the ascending  aorta, measuring 41 mm.   7. The inferior vena cava is dilated in size with >50% respiratory  variability, suggesting right atrial pressure of 8 mmHg.   Comparison(s): Prior images reviewed side by side. LVEF stable in 45-50%  range. Normal estimated RVSP. Mild to moderate mitral and moderate  tricuspid regurgitation.  Right and left heart cath 03/2021:   Ost LM to Mid LM lesion is 40% stenosed.   1st Mrg lesion is 50% stenosed.   Prox Cx lesion is 50% stenosed.   Ost LAD to Prox LAD lesion is 90% stenosed.   Prox Cx to Mid Cx lesion is 99% stenosed.   Prox RCA to Mid RCA lesion is 100% stenosed.  Prox RCA lesion is 80% stenosed.   Origin lesion is 100% stenosed.   Origin to Prox Graft lesion is 100% stenosed.   LIMA graft was visualized by angiography and is normal in caliber.   1.  Severe underlying three-vessel coronary artery disease with patent LIMA to LAD.  Chronically occluded SVG to OM and SVG to diagonal.  The SVG to right PDA which was patent on most recent angiography in 2011 is now occluded.  Left circumflex stent from the proximal portion extending into OM1 is patent with moderate in-stent restenosis and subtotal occlusion of the AV groove left circumflex which was present before. 2.   Right heart catheterization showed high normal filling pressures, no pulmonary hypertension and normal cardiac output.   Recommendations: The SVG to right PDA is now occluded which is new but the RCA has good left-to-right collaterals.  The AV groove left circumflex was subtotally occluded before and jailed by previous stent.  This could not be crossed with a wire before. Recommend continuing medical therapy.  Cardiac monitor 12/2020:  ZIO XT reviewed. 6 days, 8 hours analyzed. Predominant rhythm is sinus with prolonged PR interval and heart rate ranging from 39 bpm (early morning) up to 116 bpm and average heart rate 59 bpm. Rare PACs including couplets and triplets were noted representing less than 1% total beats. Rare PVCs including couplets and triplets were noted representing less than 1% total beats. There were also limited episodes of ventricular bigeminy and trigeminy. Multiple episodes of SVT were noted, these were generally brief with the longest lasting only 13 beats, nonsustained. There were no ventricular arrhythmias. No significant pauses.  Myoview  12/2020:    The study is low risk.   No ST deviation was noted. The ECG was negative for ischemia.   LV perfusion is abnormal. Defect 1: There is a small defect with mild reduction in uptake present in the apical anterior location(s) that is fixed. There is normal wall motion in the defect area. Consistent with artifact caused by breast attenuation. Defect 2: There is a small defect with mild reduction in uptake present in the mid to basal inferior location(s) that is partially reversible. There is normal wall motion in the defect area. Consistent with ischemia.   Left ventricular function is normal. Nuclear stress EF: 61 %.   Breast attenuation artifact was present.   Low risk study with evidence of breast attenuation artifact and also mild mid to basal inferior ischemia, LVEF normal at 61%.  Carotid duplex 12/2020:  Summary:  Right  Carotid: Velocities in the right ICA are consistent with a 1-39%  stenosis. The ECA appears >50% stenosed.   Left Carotid: Velocities in the left ICA are consistent with a 1-39%  stenosis.   Vertebrals: Left vertebral artery demonstrates antegrade flow. Right  atypical antegrade.  Subclavians: Right subclavian artery flow was disturbed. Normal flow hemodynamics were seen in the left subclavian artery.  Echo 05/2020:  IMPRESSIONS    1. Abnormal septal motion distal septal apical mid and basal inferior  wall hypokinesis . Left ventricular ejection fraction, by estimation, is  45 to 50%. The left ventricle has mildly decreased function. The left  ventricle has no regional wall motion  abnormalities. The left ventricular internal cavity size was mildly  dilated. There is moderate left ventricular hypertrophy. Left ventricular  diastolic parameters are consistent with Grade I diastolic dysfunction  (impaired relaxation).   2. Right ventricular systolic function is normal. The right ventricular  size is normal.  3. Left atrial size was mildly dilated.   4. The mitral valve is degenerative. Mild mitral valve regurgitation. No  evidence of mitral stenosis.   5. The aortic valve is tricuspid. Aortic valve regurgitation is not  visualized. Mild aortic valve sclerosis is present, with no evidence of  aortic valve stenosis.   6. The inferior vena cava is normal in size with greater than 50%  respiratory variability, suggesting right atrial pressure of 3 mmHg.    Review of Systems    All other systems reviewed and are otherwise negative except as noted above.  Physical Exam    VS:  BP 128/84   Pulse 80   Ht 5' 3 (1.6 m)   Wt 227 lb 12.8 oz (103.3 kg)   SpO2 96%   BMI 40.35 kg/m  , BMI Body mass index is 40.35 kg/m.  Wt Readings from Last 3 Encounters:  01/24/24 227 lb 12.8 oz (103.3 kg)  11/28/23 226 lb 6.4 oz (102.7 kg)  11/20/23 227 lb 12.8 oz (103.3 kg)   GEN: Obese, 78  y.o. female in no acute distress. HEENT: normal. Neck: Supple, no JVD, carotid bruits, or masses. Cardiac: S1/S2, irregularly irregular rhythm, no murmurs, rubs, or gallops. No clubbing, cyanosis, no edema to BLE.  Radials/PT 2+ and equal bilaterally.  Respiratory:  Respirations regular and unlabored, clear to auscultation bilaterally. MS: No deformity or atrophy. Skin: Warm and dry, no rash. Neuro:  Strength and sensation are intact. Psych: Normal affect.  Assessment & Plan    Orthostatic hypotension Past positive for orthostatics at previous office visit.  Denies any syncope or falls since last office visit. Does note some dizziness at times, stable.  She confirms with me that she is taking midodrine  5 mg 3 times daily and will increase to 10 mg 3 times daily.  Previously given Rx for Omron cuff sent to requested pharmacy. Heart healthy diet encouraged. Care and ED precautions discussed.   HFmrEF, DOE Stage C, NYHA class II-III symptoms. EF 10/2022 45-50%.  Weight is stable per her report.  No signs of JVD or signs of volume overload on exam, difficult to ascertain fluid status d/t patient's BMI, not in acute distress.  Does admit to some stable shortness of breath with exertion, says she feels like she gives out more easily.  DOE etiology multifactorial, has attributed this to her smoking habit. GDMT limited d/t BP and history of orthostatic hypotension.  No other medication changes at this time besides what is noted above.  She verbalized understanding.  Low sodium diet, fluid restriction <2L, and daily weights encouraged. Educated to contact our office for weight gain of 2 lbs overnight or 5 lbs in one week. ED precautions discussed. No edema noted on exam. Previously prescribed Rx for compression stockings.  Multivessel CAD, s/p CABG in 1995 Denies any recent chest pain. Underwent right and left heart cath in 2022 that revealed severe underlying three-vessel CAD, patent LIMA to LAD,  chronically occluded SVG to OM and SVG to diagonal.  See heart cath report noted above.  Right heart cath showed high normal filling pressures, no pulmonary hypertension, normal cardiac output.  Was recommended at the time to continue medical therapy.  Continue current medication regimen. Heart healthy diet encouraged. ED precautions discussed.   HLD, hx of mesenteric ischemia, PAD She is status post right hemicolectomy and right iliac to SMA bypass performed at [facility] in 2021.  Carotid duplex performed most recently revealed mild 1 to 39% stenosis along  bilateral ICAs, right subclavian artery flow was disturbed. Past LDL 59. Continue current medication regimen. Heart healthy diet and regular cardiovascular exercise encouraged.  Care and ED precautions discussed.  HTN Blood pressure stable, previous orthostatics positive.  Symptomatic with this.  Discussed to monitor BP at home at least 2 hours after medications and sitting for 5-10 minutes.  Adjusting medication as noted above.  Heart healthy diet encouraged.  She will return in 1 to 2 weeks for EKG and will bring her BP log for review.  Atypical A-flutter, post-op A-fib, tachycardia/palpitations Does have previous hx of post-op A-fib and noted to have atypical A-flutter on ECG at [facility] 05/2022. Continues to admit to episodes. HR well controlled today.  Continue Eliquis  5 mg BID, on appropriate dosage and denies any bleeding issues.  With her current BP trends and dizziness, requires better BP control prior to rate control.  Once BP is better controlled, plan to start low-dose Toprol  or atenolol.  Not a good candidate to start amiodarone  at this time.  May need to consider EP evaluation in the future. She will return in 1 to 2 weeks for EKG and will bring her BP log for review.  6. Valvular insufficiency Past TTE revealed moderate TR, along with mild to moderate MR. Not addressed, but at next office visit, plan to update Echo.   7. Aortic  dilatation Mild dilatation of ascending aorta on TTE, measuring 41 mm. Recommend updating TTE in 1 year for monitoring, will plan to update this at next office visit.   8. CKD stage 3a Most recent labs revealed stable kidney function. Encouraged adequate hydration.  Avoid nephrotoxic agents.  Continue to follow with PCP and Nephrology.   5. Tobacco use Smoking cessation encouraged and discussed.     Disposition: Care and ED precautions discussed. Follow up in 6-8 weeks with Jayson Sierras, MD or APP.  Signed, Almarie Crate, NP

## 2024-01-24 NOTE — Patient Instructions (Signed)
 Medication Instructions:   Increase Midodrine  10mg  three times per day  Continue all other medications.     Labwork:  none  Testing/Procedures:  none  Follow-Up:  6 - 8 weeks   Any Other Special Instructions Will Be Listed Below (If Applicable).  Nurse visit in 1-2 weeks for EKG - bring BP log as well for provider review.    If you need a refill on your cardiac medications before your next appointment, please call your pharmacy.

## 2024-01-31 DIAGNOSIS — E1122 Type 2 diabetes mellitus with diabetic chronic kidney disease: Secondary | ICD-10-CM | POA: Diagnosis not present

## 2024-01-31 DIAGNOSIS — N1831 Chronic kidney disease, stage 3a: Secondary | ICD-10-CM | POA: Diagnosis not present

## 2024-01-31 DIAGNOSIS — D7589 Other specified diseases of blood and blood-forming organs: Secondary | ICD-10-CM | POA: Diagnosis not present

## 2024-01-31 DIAGNOSIS — R809 Proteinuria, unspecified: Secondary | ICD-10-CM | POA: Diagnosis not present

## 2024-02-07 ENCOUNTER — Ambulatory Visit: Attending: Internal Medicine

## 2024-02-10 ENCOUNTER — Encounter: Payer: Self-pay | Admitting: Nurse Practitioner

## 2024-02-10 ENCOUNTER — Other Ambulatory Visit: Payer: Self-pay | Admitting: Internal Medicine

## 2024-02-10 ENCOUNTER — Other Ambulatory Visit: Payer: Self-pay | Admitting: Cardiovascular Disease

## 2024-02-10 DIAGNOSIS — E039 Hypothyroidism, unspecified: Secondary | ICD-10-CM

## 2024-02-10 DIAGNOSIS — I4891 Unspecified atrial fibrillation: Secondary | ICD-10-CM

## 2024-02-11 ENCOUNTER — Other Ambulatory Visit: Payer: Self-pay | Admitting: Internal Medicine

## 2024-02-11 DIAGNOSIS — G4700 Insomnia, unspecified: Secondary | ICD-10-CM

## 2024-02-11 NOTE — Telephone Encounter (Signed)
 Prescription refill request for Eliquis  received. Indication:afib Last office visit:9/25 Scr:1.23  9/25 Age: 78 Weight:103.3  kg  Prescription refilled

## 2024-02-21 ENCOUNTER — Other Ambulatory Visit: Payer: Self-pay | Admitting: Internal Medicine

## 2024-02-21 DIAGNOSIS — J449 Chronic obstructive pulmonary disease, unspecified: Secondary | ICD-10-CM

## 2024-02-25 ENCOUNTER — Encounter: Payer: Self-pay | Admitting: Nurse Practitioner

## 2024-02-25 ENCOUNTER — Ambulatory Visit: Attending: Cardiology | Admitting: *Deleted

## 2024-02-25 ENCOUNTER — Encounter: Payer: Self-pay | Admitting: *Deleted

## 2024-02-25 VITALS — BP 124/80 | HR 84 | Ht 63.0 in | Wt 221.6 lb

## 2024-02-25 DIAGNOSIS — I4891 Unspecified atrial fibrillation: Secondary | ICD-10-CM

## 2024-02-25 NOTE — Patient Instructions (Signed)
 Your physician recommends that you continue on your current medications as directed. Please refer to the Current Medication list given to you today. Based on the symptoms you reported, your provider suggest that you go to the ED for an evaluation.

## 2024-02-25 NOTE — Progress Notes (Signed)
 Presents for nurse visit for EKG and vitals per Peck's last visit request. Home BP's brought to office for review and sent to provider. Reports symptoms of chest pain and SOB all the time. Reports SOB with activity is little worse than before. Medications reviewed. Reports taking all medications as prescribed without side effects. EKG and vitals done and routed to provider for review.

## 2024-02-25 NOTE — Progress Notes (Signed)
 Per [name], She needs to go to the ED based on her current symptoms. EKG shows rate controlled A-fib but based on how she is feeling recommend ED visit. Thanks!  Patient informed and verbalized understanding of plan.

## 2024-02-25 NOTE — Progress Notes (Signed)
 [name]                                          MRN: [id number]   02/25/2024   The [facility] Quality Team Specialist reviewed this patient medical record for the purposes of chart review for care gap closure. The following were reviewed: chart review for care gap closure-kidney health evaluation for diabetes:eGFR  and uACR.    [facility]BCI Quality Team

## 2024-02-25 NOTE — Progress Notes (Deleted)
 Established Patient Pulmonology Office Visit   Subjective:  Patient ID: [name], female    DOB: 03-13-1946  MRN: [id number]  CC: No chief complaint on file.   HPI  [name] is a 78 y/o F with a PMH significant for COPD (FEV1 1.77 L, 80% predicted), HTN, atrial fibrillation who presents for follow up.  {PULM QUESTIONNAIRES (Optional):33196}  ROS  {History (Optional):23778}  Current Outpatient Medications:    albuterol  (VENTOLIN  HFA) 108 (90 Base) MCG/ACT inhaler, INHALE 1 PUFF EVERY 6 HOURS AS NEEDED FOR WHEEZING & SHORTNESS OF BREATH, Disp: 8.5 g, Rfl: 0   alfuzosin  (UROXATRAL ) 10 MG 24 hr tablet, Take 1 tablet (10 mg total) by mouth at bedtime., Disp: 30 tablet, Rfl: 11   ALPRAZolam  (XANAX ) 0.5 MG tablet, TAKE 1 TABLET BY MOUTH TWICE DAILY AS NEEDED FOR ANXIETY, Disp: 60 tablet, Rfl: 2   aspirin  EC 81 MG tablet, Take 81 mg by mouth at bedtime., Disp: , Rfl:    Blood Pressure Monitoring (OMRON 3 SERIES BP MONITOR) DEVI, Use a directed, Disp: 1 each, Rfl: 1   buPROPion  (WELLBUTRIN ) 75 MG tablet, Take 1 tablet (75 mg total) by mouth 2 (two) times daily., Disp: 60 tablet, Rfl: 5   calcitRIOL  (ROCALTROL ) 0.25 MCG capsule, Take 1 capsule (0.25 mcg total) by mouth 2 (two) times a week., Disp: 30 capsule, Rfl: 0   citalopram  (CELEXA ) 20 MG tablet, Take 1 tablet (20 mg total) by mouth daily., Disp: 90 tablet, Rfl: 1   clotrimazole -betamethasone  (LOTRISONE ) cream, Apply 1 Application topically daily., Disp: 30 g, Rfl: 0   ELIQUIS  5 MG TABS tablet, TAKE 1 TABLET BY MOUTH TWICE  DAILY, Disp: 200 tablet, Rfl: 2   estradiol  (ESTRACE ) 0.1 MG/GM vaginal cream, Place 1 Applicatorful vaginally 3 (three) times a week., Disp: 42.5 g, Rfl: 12   FARXIGA  5 MG TABS tablet, TAKE 1 TABLET BY MOUTH DAILY, Disp: 100 tablet, Rfl: 2   furosemide  (LASIX ) 20 MG tablet, Take 1 tablet (20 mg total) by mouth 2 (two) times daily for 3 days, THEN 1 tablet (20 mg total) daily., Disp: 96 tablet, Rfl: 0    ipratropium-albuterol  (DUONEB) 0.5-2.5 (3) MG/3ML SOLN, Take 3 mLs by nebulization 4 times daily and as needed for shortness of breath or wheezing., Disp: 450 mL, Rfl: 11   isosorbide  mononitrate (IMDUR ) 30 MG 24 hr tablet, TAKE ONE-HALF TABLET BY MOUTH  DAILY, Disp: 45 tablet, Rfl: 3   levothyroxine  (SYNTHROID ) 50 MCG tablet, TAKE 1 TABLET BY MOUTH DAILY  BEFORE BREAKFAST, Disp: 100 tablet, Rfl: 2   midodrine  (PROAMATINE ) 10 MG tablet, Take 1 tablet (10 mg total) by mouth 3 (three) times daily., Disp: 90 tablet, Rfl: 6   nitroGLYCERIN  (NITROSTAT ) 0.4 MG SL tablet, DISSOLVE 1 TABLET UNDER THE  TONGUE EVERY 5 MINUTES AS NEEDED FOR CHEST PAIN. MAX OF 3 TABLETS IN 15 MINUTES. CALL 911 IF PAIN  PERSISTS., Disp: 100 tablet, Rfl: 3   nystatin  (MYCOSTATIN /NYSTOP ) powder, Apply 1 Application topically 3 (three) times daily., Disp: 15 g, Rfl: 0   pantoprazole  (PROTONIX ) 40 MG tablet, TAKE ONE (1) TABLET BY MOUTH EVERY DAY, Disp: 90 tablet, Rfl: 1   potassium chloride  (KLOR-CON ) 10 MEQ tablet, Take 1 tablet (10 mEq total) by mouth 2 (two) times daily for 3 days, THEN 1 tablet (10 mEq total) daily., Disp: 96 tablet, Rfl: 0   pravastatin  (PRAVACHOL ) 80 MG tablet, TAKE 1 TABLET BY MOUTH DAILY, Disp: 100 tablet, Rfl: 2   Tiotropium  Bromide-Olodaterol (STIOLTO RESPIMAT ) 2.5-2.5 MCG/ACT AERS, Inhale 2 puffs into the lungs daily., Disp: 4 g, Rfl: 11   zolpidem  (AMBIEN ) 10 MG tablet, TAKE 1 TABLET BY MOUTH AT BEDTIME AS NEEDED FOR SLEEP, Disp: 30 tablet, Rfl: 3      Objective:  There were no vitals taken for this visit. {Pulm Vitals (Optional):32837}  Physical Exam   Diagnostic Review:  {Labs (Optional):32838}  PFT 2019: mild obstructive defect with evidence of hyperinflation, + bronchodilator response.  CT Chest 12/2023: IMPRESSION: 1. Emphysematous changes and pulmonary scarring. 2. Scattered areas of subpleural interstitial thickening mainly in the lower lung zones likely chronic inflammation. No  overt interstitial lung disease. 3. Scattered sub solid pulmonary nodules as detailed above. Recommend follow-up noncontrast chest CT in 4-6 months. 4. No mediastinal or hilar mass or adenopathy. 5. Advanced atherosclerotic calcifications and evidence of prior coronary artery bypass surgery.   Aortic Atherosclerosis (ICD10-I70.0) and Emphysema (ICD10-J43.9).     Assessment & Plan:   Assessment & Plan   No orders of the defined types were placed in this encounter.     No follow-ups on file.   Kristy Schomburg, MD

## 2024-02-26 ENCOUNTER — Ambulatory Visit: Admitting: Pulmonary Disease

## 2024-03-06 ENCOUNTER — Other Ambulatory Visit: Payer: Self-pay | Admitting: Nurse Practitioner

## 2024-03-09 ENCOUNTER — Other Ambulatory Visit: Payer: Self-pay | Admitting: Nurse Practitioner

## 2024-03-13 ENCOUNTER — Encounter: Payer: Self-pay | Admitting: Nurse Practitioner

## 2024-03-13 ENCOUNTER — Ambulatory Visit: Attending: Nurse Practitioner | Admitting: Nurse Practitioner

## 2024-03-13 VITALS — BP 140/84 | HR 74 | Ht 63.0 in | Wt 223.2 lb

## 2024-03-13 DIAGNOSIS — I4891 Unspecified atrial fibrillation: Secondary | ICD-10-CM

## 2024-03-13 DIAGNOSIS — R079 Chest pain, unspecified: Secondary | ICD-10-CM

## 2024-03-13 DIAGNOSIS — I77819 Aortic ectasia, unspecified site: Secondary | ICD-10-CM

## 2024-03-13 DIAGNOSIS — I951 Orthostatic hypotension: Secondary | ICD-10-CM

## 2024-03-13 DIAGNOSIS — I739 Peripheral vascular disease, unspecified: Secondary | ICD-10-CM

## 2024-03-13 DIAGNOSIS — R0609 Other forms of dyspnea: Secondary | ICD-10-CM | POA: Diagnosis not present

## 2024-03-13 DIAGNOSIS — I484 Atypical atrial flutter: Secondary | ICD-10-CM

## 2024-03-13 DIAGNOSIS — K559 Vascular disorder of intestine, unspecified: Secondary | ICD-10-CM

## 2024-03-13 DIAGNOSIS — R42 Dizziness and giddiness: Secondary | ICD-10-CM | POA: Diagnosis not present

## 2024-03-13 DIAGNOSIS — R002 Palpitations: Secondary | ICD-10-CM

## 2024-03-13 DIAGNOSIS — R000 Tachycardia, unspecified: Secondary | ICD-10-CM

## 2024-03-13 DIAGNOSIS — E785 Hyperlipidemia, unspecified: Secondary | ICD-10-CM

## 2024-03-13 DIAGNOSIS — Z720 Tobacco use: Secondary | ICD-10-CM

## 2024-03-13 DIAGNOSIS — I5020 Unspecified systolic (congestive) heart failure: Secondary | ICD-10-CM | POA: Diagnosis not present

## 2024-03-13 DIAGNOSIS — I10 Essential (primary) hypertension: Secondary | ICD-10-CM

## 2024-03-13 DIAGNOSIS — I2510 Atherosclerotic heart disease of native coronary artery without angina pectoris: Secondary | ICD-10-CM

## 2024-03-13 DIAGNOSIS — I38 Endocarditis, valve unspecified: Secondary | ICD-10-CM

## 2024-03-13 DIAGNOSIS — N1831 Chronic kidney disease, stage 3a: Secondary | ICD-10-CM

## 2024-03-13 MED ORDER — ISOSORBIDE MONONITRATE ER 30 MG PO TB24: 15.0000 mg | ORAL_TABLET | Freq: Every day | ORAL | Status: DC

## 2024-03-13 MED ORDER — METOPROLOL TARTRATE 25 MG PO TABS: 12.5000 mg | ORAL_TABLET | Freq: Two times a day (BID) | ORAL | 2 refills | Status: AC | PRN

## 2024-03-13 NOTE — Progress Notes (Signed)
 Office Visit    Patient Name: [name] Date of Encounter: 03/13/2024 PCP:  Tobie Suzzane POUR, MD [facility]  Cardiologist:  Jayson Sierras, MD  Advanced Practice Provider:  No care team member to display Electrophysiologist:  None   Chief Complaint and HPI    [name] is a 78 y.o. female with a hx of atypical A-flutter, CAD, s/p CABG in 1995, history of past stenting, hypertension, mixed hyperlipidemia, HFmrEF, OSA on CPAP, schizophrenia, type 2 diabetes, history of TIA, postop A-fib, and history of mesenteric ischemia, presents today for follow-up.   Previous CV history includes CABG in 1995 (LIMA-LAD, SVG-diagonal, SVG-OM, and SVG-PDA).  Underwent cardiac catheterization in 2011, received DES to circumflex.  Cardiac catheterization in 2022 revealed occluded grafts, patency of LIMA-LAD.  Was noted to have moderate in-stent restenosis in circumflex/OM distribution, was recommended to be medically managed.  Myoview  in 2022 revealed normal EF.  Was evaluated at [facility] in February 2024 for near syncope.  She had this experience after taking NTG.  Due to her mild orthostasis, it was felt that she was somewhat dehydrated and was given IV fluids.  CT of the head negative.  Antihypertensive regimen was decreased.  Last seen by Dr. Sierras on July 16, 2022.  She noted feeling better, dizziness improved.  She did note feeling weak with low stamina, this had been a chronic issue.  EKG in office revealed A-fib with nonspecific ST changes.  CHA2DS2-VASc score was found to be 7, discussion of anticoagulation was discussed.  Plavix  was stopped and replaced with Eliquis  5 mg twice daily.  05/02/2023 - Presents to office for follow-up by herself. Doing better since I last saw her. Her dizziness has resolved since starting midodrine  2.5 mg BID. Wants to know if she really needs the carotid duplex study done. Does admit to palpitations/tachcyardia sensation.  Says when she does house chores, she gives out, says she recently checked her HR during one of these episodes and found her HR to be 110. Denies any chest pain, syncope, presyncope, dizziness, orthopnea, PND, swelling or significant weight changes, acute bleeding, or claudication. Continues to admit to dependent edema at times and occasional shortness of breath that she attributes partly to her smoking.   07/16/2023 -presents today for follow-up with her granddaughter.  Patient is not a reliable historian due to forgetfulness at times that has been noticed during patient interview.  Admits to palpitations and symptoms of A-fib, says her heart rate feels like it is racing all the time, says symptoms resolve when she is resting/relaxing.  Does admit to some dizziness and symptoms of presyncope, denies any syncopal episodes.  Admits to some episodes of chest heaviness, says it feels like something is laying on her chest.  Denies any specific triggers and denies any alleviating or aggravating factors.  Difficult for her to describe. Denies any shortness of breath, syncope, orthopnea, PND, swelling or significant weight changes, acute bleeding, or claudication.  11/28/2023 - Here for follow-up with her granddaughter.  She tells me she is feeling better than she previously did when I last saw her in the office.  Does admit to some shortness of breath with exertion and confirms to me she is currently taking midodrine  2.5 mg 3 times daily, continues to note some dizziness.  Has noticed some recent coughing after she started herself on the CPAP machine for her sleep apnea.  Granddaughter says she has been noticing some symptoms of A-fib and  has reported this to her. Denies any chest pain, shortness of breath, syncope, presyncope, orthopnea, PND, swelling or significant weight changes, acute bleeding, or claudication.  01/24/2024 -here for follow-up visit with her granddaughter.  She confirms to me that she continues to  feel episodes of her A-fib.  Difficult for her to describe and patient is a difficult historian due to some noticed forgetfulness.  She also tells me she might occasionally forget to take her Eliquis  at times.  Noticing that she tends to give out more easily.  Confirms to me that she is taking midodrine  5 mg 3 times daily.  Tells me she feels like it has not really made a difference with her symptoms compared to last office visit. Denies any worsening shortness of breath, syncope, presyncope, dizziness, orthopnea, PND, swelling or significant weight changes, acute bleeding, or claudication.  03/13/2024 - Here for follow-up with her family member. Still continues to note palpitations/episodes of A-fib and dizziness. Denies any recent hypotensive episodes. Compliant with her medications. Does admit to some atypical chest pain, difficult for her to describe. Denies any syncope, presyncope, orthopnea, PND, swelling or significant weight changes, acute bleeding, or claudication. Breathing is stable per her report.   ROS: Negative.  See HPI.  EKGs/Labs/Other Studies Reviewed:   The following studies were reviewed today:  EKG: EKG is not ordered today.      Carotid duplex 05/2023:  Summary:  Right Carotid: Velocities in the right ICA are consistent with a 1-39%  stenosis. Non-hemodynamically significant plaque <50% noted in the  CCA. The ECA appears >50% stenosed.   Left Carotid: Velocities in the left ICA are consistent with a 1-39%  stenosis. Non-hemodynamically significant plaque <50% noted in the  CCA. The ECA appears <50% stenosed.   Vertebrals:  Bilateral vertebral arteries demonstrate antegrade flow.  Subclavians: Right subclavian artery flow was disturbed. Normal flow hemodynamics were seen in the left subclavian artery.   Cardiac monitor 05/2023: ZIO monitor reviewed.  3 days, 1 hours analyzed.   Atrial fibrillation present throughout monitoring with heart rate ranging from 41 bpm up to  166 bpm and average heart rate 81 bpm. There were occasional PVCs representing 1.5% total beats and otherwise rare ventricular couplets. No pauses or high degree heart block.  Echo 10/2022:  1. Left ventricular ejection fraction, by estimation, is 45 to 50%. The  left ventricle has mildly decreased function. The left ventricle  demonstrates regional wall motion abnormalities (see scoring  diagram/findings for description). There is mild left  ventricular hypertrophy. Left ventricular diastolic parameters are  indeterminate.   2. Right ventricular systolic function is low normal. The right  ventricular size is normal. There is normal pulmonary artery systolic  pressure. The estimated right ventricular systolic pressure is 31.8 mmHg.   3. The mitral valve is degenerative. Mild to moderate mitral valve  regurgitation.   4. Tricuspid valve regurgitation is moderate.   5. The aortic valve is tricuspid. Aortic valve regurgitation is not  visualized. Aortic valve sclerosis is present, with no evidence of aortic  valve stenosis.   6. Aortic dilatation noted. There is mild dilatation of the ascending  aorta, measuring 41 mm.   7. The inferior vena cava is dilated in size with >50% respiratory  variability, suggesting right atrial pressure of 8 mmHg.   Comparison(s): Prior images reviewed side by side. LVEF stable in 45-50%  range. Normal estimated RVSP. Mild to moderate mitral and moderate  tricuspid regurgitation.  Right and left heart cath  03/2021:   Ost LM to Mid LM lesion is 40% stenosed.   1st Mrg lesion is 50% stenosed.   Prox Cx lesion is 50% stenosed.   Ost LAD to Prox LAD lesion is 90% stenosed.   Prox Cx to Mid Cx lesion is 99% stenosed.   Prox RCA to Mid RCA lesion is 100% stenosed.   Prox RCA lesion is 80% stenosed.   Origin lesion is 100% stenosed.   Origin to Prox Graft lesion is 100% stenosed.   LIMA graft was visualized by angiography and is normal in caliber.   1.   Severe underlying three-vessel coronary artery disease with patent LIMA to LAD.  Chronically occluded SVG to OM and SVG to diagonal.  The SVG to right PDA which was patent on most recent angiography in 2011 is now occluded.  Left circumflex stent from the proximal portion extending into OM1 is patent with moderate in-stent restenosis and subtotal occlusion of the AV groove left circumflex which was present before. 2.  Right heart catheterization showed high normal filling pressures, no pulmonary hypertension and normal cardiac output.   Recommendations: The SVG to right PDA is now occluded which is new but the RCA has good left-to-right collaterals.  The AV groove left circumflex was subtotally occluded before and jailed by previous stent.  This could not be crossed with a wire before. Recommend continuing medical therapy.  Cardiac monitor 12/2020:  ZIO XT reviewed. 6 days, 8 hours analyzed. Predominant rhythm is sinus with prolonged PR interval and heart rate ranging from 39 bpm (early morning) up to 116 bpm and average heart rate 59 bpm. Rare PACs including couplets and triplets were noted representing less than 1% total beats. Rare PVCs including couplets and triplets were noted representing less than 1% total beats. There were also limited episodes of ventricular bigeminy and trigeminy. Multiple episodes of SVT were noted, these were generally brief with the longest lasting only 13 beats, nonsustained. There were no ventricular arrhythmias. No significant pauses.  Myoview  12/2020:    The study is low risk.   No ST deviation was noted. The ECG was negative for ischemia.   LV perfusion is abnormal. Defect 1: There is a small defect with mild reduction in uptake present in the apical anterior location(s) that is fixed. There is normal wall motion in the defect area. Consistent with artifact caused by breast attenuation. Defect 2: There is a small defect with mild reduction in uptake present in the mid to  basal inferior location(s) that is partially reversible. There is normal wall motion in the defect area. Consistent with ischemia.   Left ventricular function is normal. Nuclear stress EF: 61 %.   Breast attenuation artifact was present.   Low risk study with evidence of breast attenuation artifact and also mild mid to basal inferior ischemia, LVEF normal at 61%.  Carotid duplex 12/2020:  Summary:  Right Carotid: Velocities in the right ICA are consistent with a 1-39%  stenosis. The ECA appears >50% stenosed.   Left Carotid: Velocities in the left ICA are consistent with a 1-39%  stenosis.   Vertebrals: Left vertebral artery demonstrates antegrade flow. Right  atypical antegrade.  Subclavians: Right subclavian artery flow was disturbed. Normal flow hemodynamics were seen in the left subclavian artery.  Echo 05/2020:  IMPRESSIONS    1. Abnormal septal motion distal septal apical mid and basal inferior  wall hypokinesis . Left ventricular ejection fraction, by estimation, is  45 to 50%. The left ventricle  has mildly decreased function. The left  ventricle has no regional wall motion  abnormalities. The left ventricular internal cavity size was mildly  dilated. There is moderate left ventricular hypertrophy. Left ventricular  diastolic parameters are consistent with Grade I diastolic dysfunction  (impaired relaxation).   2. Right ventricular systolic function is normal. The right ventricular  size is normal.   3. Left atrial size was mildly dilated.   4. The mitral valve is degenerative. Mild mitral valve regurgitation. No  evidence of mitral stenosis.   5. The aortic valve is tricuspid. Aortic valve regurgitation is not  visualized. Mild aortic valve sclerosis is present, with no evidence of  aortic valve stenosis.   6. The inferior vena cava is normal in size with greater than 50%  respiratory variability, suggesting right atrial pressure of 3 mmHg.    Review of Systems    All  other systems reviewed and are otherwise negative except as noted above.  Physical Exam    VS:  BP (!) 140/84   Pulse 74   Ht 5' 3 (1.6 m)   Wt 223 lb 3.2 oz (101.2 kg)   SpO2 97%   BMI 39.54 kg/m  , BMI Body mass index is 39.54 kg/m.  Wt Readings from Last 3 Encounters:  03/13/24 223 lb 3.2 oz (101.2 kg)  02/25/24 221 lb 9.6 oz (100.5 kg)  01/24/24 227 lb 12.8 oz (103.3 kg)   GEN: Obese, 78 y.o. female in no acute distress. HEENT: normal. Neck: Supple, no JVD, carotid bruits, or masses. Cardiac: S1/S2, irregularly irregular rhythm, no murmurs, rubs, or gallops. No clubbing, cyanosis, no edema to BLE.  Radials/PT 2+ and equal bilaterally.  Respiratory:  Respirations regular and unlabored, clear to auscultation bilaterally. MS: No deformity or atrophy. Skin: Warm and dry, no rash. Neuro:  Strength and sensation are intact. Psych: Normal affect.  Assessment & Plan    Orthostatic hypotension, dizziness Past positive for orthostatics at previous office visit.  Denies any syncope or falls since last office visit. Does note some dizziness at times, stable.  Will hold Imdur  at this time to help improve her dizziness. Previously given Rx for Omron cuff sent to requested pharmacy. Heart healthy diet encouraged. Care and ED precautions discussed.   HFmrEF, DOE Stage C, NYHA class II-III symptoms. EF 10/2022 45-50%.  Weight is stable per her report.  No signs of JVD or signs of volume overload on exam, difficult to ascertain fluid status d/t patient's BMI, not in acute distress.  Does admit to some stable shortness of breath with exertion.  DOE etiology multifactorial, has attributed this to her smoking habit. GDMT limited d/t BP and history of orthostatic hypotension. Starting Lopressor  12.5 mg BID PRN for palpitations - see below. No other medication changes at this time.  She verbalized understanding.  Low sodium diet, fluid restriction <2L, and daily weights encouraged. Educated to contact  our office for weight gain of 2 lbs overnight or 5 lbs in one week. ED precautions discussed. Will obtain Echo as noted below.   Multivessel CAD, s/p CABG in 1995, chest pain of uncertain etiology Does admit to some atypical CP, difficult to describe -no strong indication at this time to undergo ischemic evaluation, especially with current symptoms. Underwent right and left heart cath in 2022 that revealed severe underlying three-vessel CAD, patent LIMA to LAD, chronically occluded SVG to OM and SVG to diagonal.  See heart cath report noted above.  Right heart cath showed high normal  filling pressures, no pulmonary hypertension, normal cardiac output.  Was recommended at the time to continue medical therapy.  Continue current medication regimen. Heart healthy diet encouraged. ED precautions discussed. Will obtain Echo for further evaluation - see above.   HLD, hx of mesenteric ischemia, PAD She is status post right hemicolectomy and right iliac to SMA bypass performed at [facility] in 2021.  Carotid duplex performed most recently revealed mild 1 to 39% stenosis along bilateral ICAs, right subclavian artery flow was disturbed. Past LDL 59. Continue current medication regimen. Heart healthy diet and regular cardiovascular exercise encouraged.  Care and ED precautions discussed.  HTN Blood pressure stable, previous orthostatics positive.  Symptomatic with this.  Will allow for permissible elevated SBP readings.  Discussed to monitor BP at home at least 2 hours after medications and sitting for 5-10 minutes.  Adjusting medication as noted above.  Heart healthy diet encouraged.  She will return in 1 to 2 weeks for EKG and will bring her BP log for review.  Atypical A-flutter/A-fib, post-op A-fib, tachycardia/palpitations Does have previous hx of post-op A-fib and noted to have atypical A-flutter on ECG at [facility] 05/2022. Continues to admit to episodes. HR well controlled today.  Continue Eliquis  5 mg BID,  on appropriate dosage and denies any bleeding issues.  Will hold Imdur  to improve her dizziness and begin Lopressor  12.5 mg twice daily as needed for palpitations.  She verbalized understanding.  May need to consider EP evaluation in the future. She will return in 1 to 2 weeks for EKG and will bring her BP log for review.  6. Valvular insufficiency Past TTE in July 2024 revealed moderate TR, along with mild to moderate MR. Will update Echo at this time.  7. Aortic dilatation Mild dilatation of ascending aorta on TTE, measuring 41 mm. Will update Echo at this time.  8. CKD stage 3a Most recent labs revealed stable kidney function. Encouraged adequate hydration.  Avoid nephrotoxic agents.  Continue to follow with PCP and Nephrology.   5. Tobacco use Smoking cessation encouraged and discussed.     Disposition: Care and ED precautions discussed. Follow up in 4-6 weeks with Jayson Sierras, MD or APP.  Signed, Almarie Crate, NP

## 2024-03-13 NOTE — Patient Instructions (Signed)
 Medication Instructions:   Begin Metoprolol  Tart (Lopressor ) 12.5mg  twice a day as needed for palpitations Hold Imdur  (Isosorbide ) for now  Continue all other medications.     Labwork:  none  Testing/Procedures:  Your physician has requested that you have an echocardiogram. Echocardiography is a painless test that uses sound waves to create images of your heart. It provides your doctor with information about the size and shape of your heart and how well your heart's chambers and valves are working. This procedure takes approximately one hour. There are no restrictions for this procedure. Please do NOT wear cologne, perfume, aftershave, or lotions (deodorant is allowed). Please arrive 15 minutes prior to your appointment time.  Please note: We ask at that you not bring children with you during ultrasound (echo/ vascular) testing. Due to room size and safety concerns, children are not allowed in the ultrasound rooms during exams. Our front office staff cannot provide observation of children in our lobby area while testing is being conducted. An adult accompanying a patient to their appointment will only be allowed in the ultrasound room at the discretion of the ultrasound technician under special circumstances. We apologize for any inconvenience. Office will contact with results via phone, letter or mychart.     Follow-Up:  4-6 weeks   Any Other Special Instructions Will Be Listed Below (If Applicable).  BP log x 1 week  EKG nurse visit in 1 week - return BP log at this visit   If you need a refill on your cardiac medications before your next appointment, please call your pharmacy.

## 2024-03-16 ENCOUNTER — Telehealth: Payer: Self-pay

## 2024-03-16 NOTE — Telephone Encounter (Signed)
 Called pt, spoke with Suzen, pt's Granddaughter that [facility] and to disregard the [employer].

## 2024-03-18 ENCOUNTER — Ambulatory Visit

## 2024-03-19 ENCOUNTER — Encounter: Payer: Self-pay | Admitting: Internal Medicine

## 2024-03-19 ENCOUNTER — Ambulatory Visit: Admitting: Internal Medicine

## 2024-03-19 VITALS — BP 118/85 | HR 78 | Ht 63.0 in | Wt 226.2 lb

## 2024-03-19 DIAGNOSIS — F331 Major depressive disorder, recurrent, moderate: Secondary | ICD-10-CM

## 2024-03-19 DIAGNOSIS — E1122 Type 2 diabetes mellitus with diabetic chronic kidney disease: Secondary | ICD-10-CM

## 2024-03-19 DIAGNOSIS — I959 Hypotension, unspecified: Secondary | ICD-10-CM

## 2024-03-19 DIAGNOSIS — I4821 Permanent atrial fibrillation: Secondary | ICD-10-CM

## 2024-03-19 DIAGNOSIS — Z23 Encounter for immunization: Secondary | ICD-10-CM | POA: Diagnosis not present

## 2024-03-19 DIAGNOSIS — I5020 Unspecified systolic (congestive) heart failure: Secondary | ICD-10-CM

## 2024-03-19 DIAGNOSIS — Z602 Problems related to living alone: Secondary | ICD-10-CM

## 2024-03-19 DIAGNOSIS — J9611 Chronic respiratory failure with hypoxia: Secondary | ICD-10-CM

## 2024-03-19 DIAGNOSIS — F411 Generalized anxiety disorder: Secondary | ICD-10-CM

## 2024-03-19 DIAGNOSIS — J449 Chronic obstructive pulmonary disease, unspecified: Secondary | ICD-10-CM

## 2024-03-19 DIAGNOSIS — Z7984 Long term (current) use of oral hypoglycemic drugs: Secondary | ICD-10-CM

## 2024-03-19 DIAGNOSIS — N1832 Chronic kidney disease, stage 3b: Secondary | ICD-10-CM

## 2024-03-19 DIAGNOSIS — M25571 Pain in right ankle and joints of right foot: Secondary | ICD-10-CM

## 2024-03-19 MED ORDER — BREZTRI AEROSPHERE 160-9-4.8 MCG/ACT IN AERO: 2.0000 | INHALATION_SPRAY | Freq: Two times a day (BID) | RESPIRATORY_TRACT | Status: AC

## 2024-03-19 MED ORDER — BREZTRI AEROSPHERE 160-9-4.8 MCG/ACT IN AERO: 2.0000 | INHALATION_SPRAY | Freq: Two times a day (BID) | RESPIRATORY_TRACT | 11 refills | Status: AC

## 2024-03-19 NOTE — Assessment & Plan Note (Addendum)
 Due to underlying COPD Uses Stiolto and as needed albuterol  Uses O2 at nighttime, O2 sat drops below 88% at nighttime, new Rx was sent to [facility] previously

## 2024-03-19 NOTE — Assessment & Plan Note (Signed)
 Takes Xanax , sometimes twice daily - would avoid increasing dose of Xanax  for now as she is also on Ambien  On Celexa  for MDD and anxiety DC Wellbutrin , as it was prescribed for smoking cessation, but she has stopped taking it now Patient also takes Ambien  for insomnia. She lives alone and could be a reason for her anxiety as well

## 2024-03-19 NOTE — Assessment & Plan Note (Addendum)
 Uncontrolled with Stiolto and PRN Albuterol  Switch to Breztri  - sample provided Needs to use maintenance inhalers regularly She would benefit from using nebulizer treatments, has DuoNeb Promethazine  DM syrup as needed for cough Needs to cut down -> quit smoking Planned to see pulmonologist

## 2024-03-19 NOTE — Patient Instructions (Signed)
 Please start using Breztri instead of Stiolto.  Please use Albuterol  as needed for shortness of breath or wheezing.  Please follow up with Orthopedic surgery for ankle sprain.  Please continue to take medications as prescribed.  Please continue to follow low carb diet and ambulate as tolerated.

## 2024-03-19 NOTE — Assessment & Plan Note (Signed)
 Since 03/14/24 Was seen in urgent care yesterday, x-ray of ankle was negative for any acute fracture Advised to continue to use ankle boot for now Referred to orthopedic surgery for further evaluation

## 2024-03-19 NOTE — Assessment & Plan Note (Signed)
 Last echo (02/22) showed LVEF of 45 to 50% She has chronic leg swelling, but has improved now Takes Furosemide  20 mg QD On Midodrine  for hypotension now

## 2024-03-19 NOTE — Assessment & Plan Note (Signed)
 Dizziness likely due to hypotension, but now better Advised to take Midodrine  10 mg TID

## 2024-03-19 NOTE — Assessment & Plan Note (Signed)
 On Eliquis now, stopped Plavix Currently rate controlled,  metoprolol was discontinued by Cardiology as she has hypotensive spells Followed by Cardiology

## 2024-03-19 NOTE — Assessment & Plan Note (Addendum)
   POC HbA1c: 6.7 Well controlled On Farxiga  for CHF now Avoid tighter control in her case due to risk of hypoglycemia related fall Has CKD, followed by Nephrology On statin

## 2024-03-19 NOTE — Assessment & Plan Note (Signed)
 Uncontrolled due to her domestic condition, especially feeling lonely Reports that her family members do not talk to her now Was improving with Celexa  Takes Ambien  as needed for insomnia Referral to [facility] for home safety evaluation

## 2024-03-19 NOTE — Progress Notes (Signed)
 Established Patient Office Visit  Subjective:  Patient ID: [name], female    DOB: Jul 26, 1945  Age: 78 y.o. MRN: [id number]  CC:  Chief Complaint  Patient presents with   Hypothyroidism    4 month f/u    Anxiety    4 month f/u     HPI [name] is a 78 y.o. female with past medical history of hypertension, hyperlipidemia, mesenteric ischemia status post right hemicolectomy, coronary artery disease, hypothyroidism, type 2 diabetes mellitus, depression with anxiety, obesity, arthritis, chronic back pain who presents for f/u of her chronic medical conditions.  Hypertension and A-fib: BP is well-controlled today. She takes Midodrine  10 mg TID. She still reports intermittent dizziness with position change.  She still takes Lasix  20 mg once daily, as advised by Cardiology.  She is on Eliquis  and Aspirin . She has easy bruising.  Denies any chest pain or palpitations currently.  She has chronic exertional dyspnea.  She takes Imdur  15 mg QD for angina.  She has been placed on Farxiga  for CHF and CKD.  MDD and GAD: She currently lives alone, and feels anxious due to loneliness. She had anhedonia, insomnia, lack of interest in routine activities and agitation, but is improving slowly. Denies any SI or HI. She was placed on Celexa , which has helped her with MDD. She takes Xanax  for anxiety and takes Ambien  PRN for insomnia.  She has stopped taking Wellbutrin , which was mainly prescribed for smoking cessation. She has cut down smoking up to 4-5 cigarettes per day.  COPD: She has been using Stiolto and as needed albuterol , but does not use it regularly.  She uses home O2 at nighttime for dyspnea and hypoxia (O2 sat less than 88%).  She has episodes of dyspnea and chronic cough.  Denies any fever or chills. C/o chronic nasal congestion and postnasal drip.  CKD: Last BMP in 09/25 showed GFR 45. Followed by Nephrology - Dr. Rachele. Denies dysuria or hematuria currently.  Right ankle  pain: She had an injury by a chair falling on her right foot on 03/14/24.  She initially had right knee pain, but later started having right ankle pain.  She went to urgent care yesterday, had x-ray of ankle, and was told of ankle sprain.  She has ankle boot in place currently.  She reports pain with movement of the ankle.  She is able to wiggle her toes.  Past Medical History:  Diagnosis Date   Acute ischemic colitis 09/11/2005   Anxiety    Arthritis    Atherosclerotic vascular disease    Calcified plaque at the origin of the celiac and SMA   CHF (congestive heart failure) (HCC)    Coronary atherosclerosis of native coronary artery    Mulitvessel LVEF 50-50%, DES circ 1/11, occluded SVG to OM and SVG to diagonal , LVEF 60%   COVID-19 virus infection 11/07/2020   Depression    Diverticulosis of colon    Essential hypertension    Hypothyroidism    Mesenteric ischemia    Mitral regurgitation    Mild to moderate   Mixed hyperlipidemia    Myocardial infarction (HCC) 1990   Obesity    Orthostatic hypotension    PAD (peripheral artery disease)    PVC's (premature ventricular contractions)    Schizophrenia (HCC)    Sleep apnea    CPAP   TIA (transient ischemic attack) 11/20/2017   Tubular adenoma    Type 2 diabetes mellitus (HCC)    Vascular  complications of mesenteric artery     Past Surgical History:  Procedure Laterality Date   BACK SURGERY     Lumbar spine surgery   Carotid endarectomy Bilateral    CARPAL TUNNEL RELEASE     Bilateral   CATARACT EXTRACTION W/PHACO Right 08/24/2013   Procedure: CATARACT EXTRACTION PHACO AND INTRAOCULAR LENS PLACEMENT (IOC);  Surgeon: Cherene Mania, MD;  Location: [facility];  Service: Ophthalmology;  Laterality: Right;  CDE 8.68   COLONOSCOPY  09/14/2005   Dr. Verdel colitis   COLONOSCOPY WITH ESOPHAGOGASTRODUODENOSCOPY (EGD)  04/14/2012   Dr. Shaaron- EGD= gastric erosions of doubtful clinical significance per bx- chronic inactive gastritis,  benign small bowel mucosa. TCS=colonic diverticulosis, tubular adenoma   CORONARY ARTERY BYPASS GRAFT     LIMA-LAD; SVG-OM; SVG-DX in 1995 [facility]   NECK SURGERY     Cervical laminectomy   PARTIAL HYSTERECTOMY     RIGHT/LEFT HEART CATH AND CORONARY/GRAFT ANGIOGRAPHY N/A 04/12/2021   Procedure: RIGHT/LEFT HEART CATH AND CORONARY/GRAFT ANGIOGRAPHY;  Surgeon: Darron Deatrice LABOR, MD;  Location: [facility];  Service: Cardiovascular;  Laterality: N/A;   stents x4      Family History  Problem Relation Age of Onset   Alcohol abuse Mother    Alcohol abuse Father    Coronary artery disease Other    Hypertension Other    Crohn's disease Sister 62   Stroke Daughter     Social History   Socioeconomic History   Marital status: Widowed    Spouse name: Not on file   Number of children: 4   Years of education: Not on file   Highest education level: Not on file  Occupational History   Occupation: DISABLED    Employer: UNEMPLOYED  Tobacco Use   Smoking status: Every Day    Current packs/day: 0.50    Average packs/day: 0.5 packs/day for 51.3 years (25.7 ttl pk-yrs)    Types: Cigarettes    Start date: 12/30/2018    Passive exposure: Never   Smokeless tobacco: Never  Vaping Use   Vaping status: Never Used  Substance and Sexual Activity   Alcohol use: Yes    Alcohol/week: 0.0 standard drinks of alcohol    Comment: occasionally    Drug use: No   Sexual activity: Not Currently  Other Topics Concern   Not on file  Social History Narrative   LIves alone   Social Drivers of Health   Financial Resource Strain: Low Risk  (08/14/2023)   Overall Financial Resource Strain (CARDIA)    Difficulty of Paying Living Expenses: Not hard at all  Food Insecurity: No Food Insecurity (08/14/2023)   Hunger Vital Sign    Worried About Running Out of Food in the Last Year: Never true    Ran Out of Food in the Last Year: Never true  Transportation Needs: No Transportation Needs (08/14/2023)   PRAPARE -  Administrator, Civil Service (Medical): No    Lack of Transportation (Non-Medical): No  Physical Activity: Insufficiently Active (08/14/2023)   Exercise Vital Sign    Days of Exercise per Week: 2 days    Minutes of Exercise per Session: 20 min  Stress: No Stress Concern Present (08/14/2023)   [employer] of Occupational Health - Occupational Stress Questionnaire    Feeling of Stress : Only a little  Social Connections: Socially Isolated (08/14/2023)   Social Connection and Isolation Panel    Frequency of Communication with Friends and Family: More than three times a  week    Frequency of Social Gatherings with Friends and Family: Three times a week    Attends Religious Services: Never    Active Member of Clubs or Organizations: No    Attends Banker Meetings: Never    Marital Status: Widowed  Intimate Partner Violence: Not At Risk (08/14/2023)   Humiliation, Afraid, Rape, and Kick questionnaire    Fear of Current or Ex-Partner: No    Emotionally Abused: No    Physically Abused: No    Sexually Abused: No    Outpatient Medications Prior to Visit  Medication Sig Dispense Refill   albuterol  (VENTOLIN  HFA) 108 (90 Base) MCG/ACT inhaler INHALE 1 PUFF EVERY 6 HOURS AS NEEDED FOR WHEEZING & SHORTNESS OF BREATH 8.5 g 0   alfuzosin  (UROXATRAL ) 10 MG 24 hr tablet Take 1 tablet (10 mg total) by mouth at bedtime. 30 tablet 11   ALPRAZolam  (XANAX ) 0.5 MG tablet TAKE 1 TABLET BY MOUTH TWICE DAILY AS NEEDED FOR ANXIETY 60 tablet 2   aspirin  EC 81 MG tablet Take 81 mg by mouth at bedtime.     Blood Pressure Monitoring (OMRON 3 SERIES BP MONITOR) DEVI Use a directed 1 each 1   calcitRIOL  (ROCALTROL ) 0.25 MCG capsule Take 1 capsule (0.25 mcg total) by mouth 2 (two) times a week. 30 capsule 0   citalopram  (CELEXA ) 20 MG tablet Take 1 tablet (20 mg total) by mouth daily. 90 tablet 1   clotrimazole -betamethasone  (LOTRISONE ) cream Apply 1 Application topically daily. 30 g 0    ELIQUIS  5 MG TABS tablet TAKE 1 TABLET BY MOUTH TWICE  DAILY 200 tablet 2   estradiol  (ESTRACE ) 0.1 MG/GM vaginal cream Place 1 Applicatorful vaginally 3 (three) times a week. 42.5 g 12   FARXIGA  5 MG TABS tablet TAKE 1 TABLET BY MOUTH DAILY 100 tablet 2   furosemide  (LASIX ) 20 MG tablet Take 1 tablet (20 mg total) by mouth 2 (two) times daily for 3 days, THEN 1 tablet (20 mg total) daily. 96 tablet 0   ipratropium-albuterol  (DUONEB) 0.5-2.5 (3) MG/3ML SOLN Take 3 mLs by nebulization 4 times daily and as needed for shortness of breath or wheezing. 450 mL 11   isosorbide  mononitrate (IMDUR ) 30 MG 24 hr tablet Take 0.5 tablets (15 mg total) by mouth daily. (HOLDING CURRENTLY 03/13/2024)     levothyroxine  (SYNTHROID ) 50 MCG tablet TAKE 1 TABLET BY MOUTH DAILY  BEFORE BREAKFAST 100 tablet 2   metoprolol  tartrate (LOPRESSOR ) 25 MG tablet Take 0.5 tablets (12.5 mg total) by mouth 2 (two) times daily as needed (palpitations). 60 tablet 2   midodrine  (PROAMATINE ) 10 MG tablet Take 1 tablet (10 mg total) by mouth 3 (three) times daily. 90 tablet 6   nitroGLYCERIN  (NITROSTAT ) 0.4 MG SL tablet DISSOLVE 1 TABLET UNDER THE  TONGUE EVERY 5 MINUTES AS NEEDED FOR CHEST PAIN. MAX OF 3 TABLETS IN 15 MINUTES. CALL 911 IF PAIN  PERSISTS. 100 tablet 3   nystatin  (MYCOSTATIN /NYSTOP ) powder Apply 1 Application topically 3 (three) times daily. 15 g 0   pantoprazole  (PROTONIX ) 40 MG tablet TAKE ONE (1) TABLET BY MOUTH EVERY DAY 90 tablet 1   potassium chloride  (KLOR-CON ) 10 MEQ tablet Take 1 tablet (10 mEq total) by mouth 2 (two) times daily for 3 days, THEN 1 tablet (10 mEq total) daily. 96 tablet 0   pravastatin  (PRAVACHOL ) 80 MG tablet TAKE 1 TABLET BY MOUTH DAILY 100 tablet 2   zolpidem  (AMBIEN ) 10 MG tablet TAKE 1 TABLET  BY MOUTH AT BEDTIME AS NEEDED FOR SLEEP 30 tablet 3   buPROPion  (WELLBUTRIN ) 75 MG tablet Take 1 tablet (75 mg total) by mouth 2 (two) times daily. 60 tablet 5   Tiotropium Bromide -Olodaterol (STIOLTO  RESPIMAT) 2.5-2.5 MCG/ACT AERS Inhale 2 puffs into the lungs daily. 4 g 11   No facility-administered medications prior to visit.    No Known Allergies  ROS Review of Systems  Constitutional:  Negative for chills and fever.  HENT:  Negative for congestion, sinus pressure and sinus pain.   Eyes:  Negative for pain and discharge.  Respiratory:  Positive for cough (chronic) and shortness of breath.   Cardiovascular:  Positive for leg swelling. Negative for chest pain and palpitations.  Gastrointestinal:  Negative for diarrhea, nausea and vomiting.  Genitourinary:  Negative for dysuria and hematuria.  Musculoskeletal:  Positive for arthralgias, back pain, gait problem and joint swelling.  Skin:  Negative for rash.       Bruising over UE and LE  Neurological:  Positive for weakness. Negative for headaches.  Hematological:  Bruises/bleeds easily.  Psychiatric/Behavioral:  Positive for agitation and sleep disturbance. Negative for behavioral problems. The patient is nervous/anxious.       Objective:    Physical Exam Vitals reviewed.  Constitutional:      General: She is not in acute distress.    Appearance: She is obese. She is not diaphoretic.     Comments: In wheelchair  HENT:     Head: Normocephalic and atraumatic.     Nose: Congestion present.     Mouth/Throat:     Mouth: Mucous membranes are moist.     Pharynx: No posterior oropharyngeal erythema.  Eyes:     General: No scleral icterus.    Extraocular Movements: Extraocular movements intact.  Cardiovascular:     Rate and Rhythm: Normal rate. Rhythm irregular.     Heart sounds: Normal heart sounds. No murmur heard. Pulmonary:     Breath sounds: No wheezing or rales.  Abdominal:     Palpations: Abdomen is soft.     Tenderness: There is no abdominal tenderness.  Musculoskeletal:     Cervical back: Normal range of motion and neck supple. No tenderness.     Right lower leg: No edema.     Left lower leg: No edema.   Skin:    General: Skin is warm.     Findings: Bruising (over b/l UE and LE) present. No rash.     Comments: Multiple whitish skin eruptions over b/l UE and back -likely actinic keratosis  Neurological:     General: No focal deficit present.     Mental Status: She is alert and oriented to person, place, and time.     Sensory: No sensory deficit.     Motor: No weakness.     Gait: Gait abnormal (Likely in the setting of baseline hip pain).  Psychiatric:        Mood and Affect: Mood is anxious.        Behavior: Behavior normal.     BP 118/85   Pulse 78   Ht 5' 3 (1.6 m)   Wt 226 lb 3.2 oz (102.6 kg)   SpO2 93%   BMI 40.07 kg/m  Wt Readings from Last 3 Encounters:  03/19/24 226 lb 3.2 oz (102.6 kg)  03/13/24 223 lb 3.2 oz (101.2 kg)  02/25/24 221 lb 9.6 oz (100.5 kg)    Lab Results  Component Value Date   TSH 2.620  11/20/2023   Lab Results  Component Value Date   WBC 5.5 11/20/2023   HGB 13.2 11/20/2023   HCT 39.9 11/20/2023   MCV 101 (H) 11/20/2023   PLT 167 11/20/2023   Lab Results  Component Value Date   NA 143 01/01/2024   K 5.0 01/01/2024   CO2 21 01/01/2024   GLUCOSE 147 (H) 01/01/2024   BUN 16 01/01/2024   CREATININE 1.23 (H) 01/01/2024   BILITOT 0.5 11/20/2023   ALKPHOS 89 11/20/2023   AST 15 11/20/2023   ALT 11 11/20/2023   PROT 6.4 11/20/2023   ALBUMIN 3.7 (L) 11/20/2023   CALCIUM 9.3 01/01/2024   ANIONGAP 9 03/13/2021   EGFR 45 (L) 01/01/2024   Lab Results  Component Value Date   CHOL 124 11/20/2023   Lab Results  Component Value Date   HDL 49 11/20/2023   Lab Results  Component Value Date   LDLCALC 55 11/20/2023   Lab Results  Component Value Date   TRIG 107 11/20/2023   Lab Results  Component Value Date   CHOLHDL 2.5 11/20/2023   Lab Results  Component Value Date   HGBA1C 6.6 (H) 11/20/2023      Assessment & Plan:   Problem List Items Addressed This Visit       Cardiovascular and Mediastinum   Atrial fibrillation  (HCC)   On Eliquis  now, stopped Plavix  Currently rate controlled,  metoprolol  was discontinued by Cardiology as she has hypotensive spells Followed by Cardiology      HFrEF (heart failure with reduced ejection fraction) (HCC) - Primary   Last echo (02/22) showed LVEF of 45 to 50% She has chronic leg swelling, but has improved now Takes Furosemide  20 mg QD On Midodrine  for hypotension now      Hypotension   Dizziness likely due to hypotension, but now better Advised to take Midodrine  10 mg TID        Respiratory   COPD (chronic obstructive pulmonary disease) (HCC)   Uncontrolled with Stiolto and PRN Albuterol  Switch to Breztri - sample provided Needs to use maintenance inhalers regularly She would benefit from using nebulizer treatments, has DuoNeb Promethazine  DM syrup as needed for cough Needs to cut down -> quit smoking Planned to see pulmonologist      Relevant Medications   budesonide-glycopyrrolate-formoterol (BREZTRI AEROSPHERE) 160-9-4.8 MCG/ACT AERO inhaler   budesonide-glycopyrrolate-formoterol (BREZTRI AEROSPHERE) 160-9-4.8 MCG/ACT AERO inhaler   Chronic respiratory failure with hypoxia (HCC)   Due to underlying COPD Uses Stiolto and as needed albuterol  Uses O2 at nighttime, O2 sat drops below 88% at nighttime, new Rx was sent to [place] previously        Endocrine   DM2 (diabetes mellitus, type 2) with CKD     POC HbA1c: 6.7 Well controlled On Farxiga  for CHF now Avoid tighter control in her case due to risk of hypoglycemia related fall Has CKD, followed by Nephrology On statin      Relevant Orders   Bayer DCA Hb A1c Waived   Urine Microalbumin w/creat. ratio     Genitourinary   Chronic kidney disease, stage 3b (HCC)   Likely due to HTN and DM On Lasix  for HFrEF, takes it QD for leg swelling Avoid nephrotoxic agents including NSAIDs Followed by Dr. Rachele - last BMP and visit note reviewed        Other   GAD (generalized  anxiety disorder) (Chronic)   Takes Xanax , sometimes twice daily - would avoid increasing dose of Xanax  for  now as she is also on Ambien  On Celexa  for MDD and anxiety DC Wellbutrin , as it was prescribed for smoking cessation, but she has stopped taking it now Patient also takes Ambien  for insomnia. She lives alone and could be a reason for her anxiety as well      MDD (major depressive disorder), recurrent episode, moderate (HCC)   Uncontrolled due to her domestic condition, especially feeling lonely Reports that her family members do not talk to her now Was improving with Celexa  Takes Ambien  as needed for insomnia Referral to [facility] for home safety evaluation      Acute right ankle pain   Since 03/14/24 Was seen in urgent care yesterday, x-ray of ankle was negative for any acute fracture Advised to continue to use ankle boot for now Referred to orthopedic surgery for further evaluation      Relevant Orders   Ambulatory referral to Orthopedic Surgery   Other Visit Diagnoses       Lives alone without help available       Relevant Orders   AMB Referral [facility] Care Management     Encounter for immunization       Relevant Orders   Flu vaccine HIGH DOSE PF(Fluzone Trivalent) (Completed)         Meds ordered this encounter  Medications   budesonide-glycopyrrolate-formoterol (BREZTRI AEROSPHERE) 160-9-4.8 MCG/ACT AERO inhaler    Sig: Inhale 2 puffs into the lungs 2 (two) times daily.    Dispense:  10.7 g    Refill:  11   budesonide-glycopyrrolate-formoterol (BREZTRI AEROSPHERE) 160-9-4.8 MCG/ACT AERO inhaler    Sig: Inhale 2 puffs into the lungs 2 (two) times daily.    Lot Number?:   [id number]    Expiration Date?:   03/29/2026    Quantity:   1    Follow-up: Return in about 4 months (around 07/17/2024) for DM and GAD.    Suzzane MARLA Blanch, MD

## 2024-03-19 NOTE — Assessment & Plan Note (Signed)
 Likely due to HTN and DM On Lasix  for HFrEF, takes it QD for leg swelling Avoid nephrotoxic agents including NSAIDs Followed by Dr. Rachele - last BMP and visit note reviewed

## 2024-03-20 ENCOUNTER — Telehealth: Payer: Self-pay

## 2024-03-20 ENCOUNTER — Encounter (HOSPITAL_BASED_OUTPATIENT_CLINIC_OR_DEPARTMENT_OTHER): Admitting: Pulmonary Disease

## 2024-03-20 NOTE — Progress Notes (Signed)
 Complex Care Management Note  Care Guide Note 03/20/2024 Name: [name] MRN: [id number] DOB: [birth year]  [name] is a 78 y.o. year old female who sees Tobie Suzzane POUR, MD for primary care. I reached out to [name] by phone today to offer complex care management services.  [name] was given information about Complex Care Management services today including:   The Complex Care Management services include support from the care team which includes your Nurse Care Manager, Clinical Social Worker, or Pharmacist.  The Complex Care Management team is here to help remove barriers to the health concerns and goals most important to you. Complex Care Management services are voluntary, and the patient may decline or stop services at any time by request to their care team member.   Complex Care Management Consent Status: Patient agreed to services and verbal consent obtained.   Follow up plan:  Telephone appointment with complex care management team member scheduled for:  04/10/2024  Encounter Outcome:  Patient Scheduled  Jeoffrey Buffalo , RMA     [place]  [facility] Guide  Direct Dial: [phone]  Website: [email or web address]

## 2024-03-20 NOTE — Progress Notes (Signed)
 Error

## 2024-03-21 LAB — MICROALBUMIN / CREATININE URINE RATIO
Creatinine, Urine: 167.9 mg/dL
Microalb/Creat Ratio: 37 mg/g{creat} — ABNORMAL HIGH (ref 0–29)
Microalbumin, Urine: 62.8 ug/mL

## 2024-03-21 LAB — BAYER DCA HB A1C WAIVED: HB A1C (BAYER DCA - WAIVED): 6.7 % — ABNORMAL HIGH (ref 4.8–5.6)

## 2024-03-23 ENCOUNTER — Ambulatory Visit: Payer: Self-pay | Admitting: Internal Medicine

## 2024-03-31 ENCOUNTER — Other Ambulatory Visit: Payer: Self-pay | Admitting: Internal Medicine

## 2024-03-31 ENCOUNTER — Ambulatory Visit: Payer: Self-pay

## 2024-03-31 DIAGNOSIS — M25571 Pain in right ankle and joints of right foot: Secondary | ICD-10-CM

## 2024-03-31 MED ORDER — TRAMADOL HCL 50 MG PO TABS: 50.0000 mg | ORAL_TABLET | Freq: Three times a day (TID) | ORAL | 0 refills | Status: AC | PRN

## 2024-03-31 NOTE — Telephone Encounter (Signed)
 FYI Only or Action Required?: Action required by provider: medication refill request.  Patient was last seen in primary care on 03/19/2024 by Tobie Suzzane POUR, MD.  Called Nurse Triage reporting Foot Pain.  Symptoms began several days ago.  Interventions attempted: OTC medications: pain med.  Symptoms are: unchanged.  Triage Disposition: See PCP When Office is Open (Within 3 Days)  Patient/caregiver understands and will follow disposition?: No, wishes to speak with PCP  Copied from CRM #[id number]. Topic: Clinical - Red Word Triage >> Mar 31, 2024 10:32 AM Tobias L wrote: Red Word that prompted transfer to Nurse Triage: foot pain, up all night - requesting tramadol  Reason for Disposition  [1] MODERATE pain (e.g., interferes with normal activities, limping) AND [2] present > 3 days  Answer Assessment - Initial Assessment Questions Offered appt, patient's granddaughter declined and reports already seen MD, LOV 03/19/24, awaiting appt with orthopedic and need pain medication.  Advised call back or UC/ED if symptoms worsen.  1. ONSET: When did the pain start?      03/15/24 2. LOCATION: Where is the pain located?      Right hip and right foot 3. PAIN: How bad is the pain?    (Scale 1-10; or mild, moderate, severe)     Moderate to severe 4. WORK OR EXERCISE: Has there been any recent work or exercise that involved this part of the body?      na 5. CAUSE: What do you think is causing the foot pain?     Fall 03/15/24; seen MD, referral for ortho 6. OTHER SYMPTOMS: Do you have any other symptoms? (e.g., leg pain, rash, fever, numbness)     Denies fever, numbness, weakness, discolored  Protocols used: Foot Pain-A-AH

## 2024-03-31 NOTE — Telephone Encounter (Signed)
 Pt has been informed.

## 2024-03-31 NOTE — Progress Notes (Deleted)
 Established Patient Pulmonology Office Visit   Subjective:  Patient ID: [name], female    DOB: 04-Sep-1945  MRN: [id number]  CC: No chief complaint on file.   HPI  [name] is a 78 year old female, current smoker. PMH significant for afib, HTN, CAD, HR with reduced EF, OSA, COPD, chronic respiratory failure, GERD, type 2 diabetes, hypothyroidism, CKD, morbid obesity.   Last seen by NP walsh for sleep consult. PSG c/w mild sleep apnea. AHI 7. Recommended autoPAP 5-15 cmH2O.  {PULM QUESTIONNAIRES (Optional):33196}  ROS  {History (Optional):23778}  Current Outpatient Medications:    traMADol  (ULTRAM ) 50 MG tablet, Take 1 tablet (50 mg total) by mouth every 8 (eight) hours as needed for up to 5 days., Disp: 15 tablet, Rfl: 0   albuterol  (VENTOLIN  HFA) 108 (90 Base) MCG/ACT inhaler, INHALE 1 PUFF EVERY 6 HOURS AS NEEDED FOR WHEEZING & SHORTNESS OF BREATH, Disp: 8.5 g, Rfl: 0   alfuzosin  (UROXATRAL ) 10 MG 24 hr tablet, Take 1 tablet (10 mg total) by mouth at bedtime., Disp: 30 tablet, Rfl: 11   ALPRAZolam  (XANAX ) 0.5 MG tablet, TAKE 1 TABLET BY MOUTH TWICE DAILY AS NEEDED FOR ANXIETY, Disp: 60 tablet, Rfl: 2   aspirin  EC 81 MG tablet, Take 81 mg by mouth at bedtime., Disp: , Rfl:    Blood Pressure Monitoring (OMRON 3 SERIES BP MONITOR) DEVI, Use a directed, Disp: 1 each, Rfl: 1   budesonide-glycopyrrolate-formoterol (BREZTRI  AEROSPHERE) 160-9-4.8 MCG/ACT AERO inhaler, Inhale 2 puffs into the lungs 2 (two) times daily., Disp: 10.7 g, Rfl: 11   budesonide-glycopyrrolate-formoterol (BREZTRI  AEROSPHERE) 160-9-4.8 MCG/ACT AERO inhaler, Inhale 2 puffs into the lungs 2 (two) times daily., Disp: , Rfl:    calcitRIOL  (ROCALTROL ) 0.25 MCG capsule, Take 1 capsule (0.25 mcg total) by mouth 2 (two) times a week., Disp: 30 capsule, Rfl: 0   citalopram  (CELEXA ) 20 MG tablet, Take 1 tablet (20 mg total) by mouth daily., Disp: 90 tablet, Rfl: 1   clotrimazole -betamethasone  (LOTRISONE ) cream,  Apply 1 Application topically daily., Disp: 30 g, Rfl: 0   ELIQUIS  5 MG TABS tablet, TAKE 1 TABLET BY MOUTH TWICE  DAILY, Disp: 200 tablet, Rfl: 2   estradiol  (ESTRACE ) 0.1 MG/GM vaginal cream, Place 1 Applicatorful vaginally 3 (three) times a week., Disp: 42.5 g, Rfl: 12   FARXIGA  5 MG TABS tablet, TAKE 1 TABLET BY MOUTH DAILY, Disp: 100 tablet, Rfl: 2   furosemide  (LASIX ) 20 MG tablet, Take 1 tablet (20 mg total) by mouth 2 (two) times daily for 3 days, THEN 1 tablet (20 mg total) daily., Disp: 96 tablet, Rfl: 0   ipratropium-albuterol  (DUONEB) 0.5-2.5 (3) MG/3ML SOLN, Take 3 mLs by nebulization 4 times daily and as needed for shortness of breath or wheezing., Disp: 450 mL, Rfl: 11   isosorbide  mononitrate (IMDUR ) 30 MG 24 hr tablet, Take 0.5 tablets (15 mg total) by mouth daily. (HOLDING CURRENTLY 03/13/2024), Disp: , Rfl:    levothyroxine  (SYNTHROID ) 50 MCG tablet, TAKE 1 TABLET BY MOUTH DAILY  BEFORE BREAKFAST, Disp: 100 tablet, Rfl: 2   metoprolol  tartrate (LOPRESSOR ) 25 MG tablet, Take 0.5 tablets (12.5 mg total) by mouth 2 (two) times daily as needed (palpitations)., Disp: 60 tablet, Rfl: 2   midodrine  (PROAMATINE ) 10 MG tablet, Take 1 tablet (10 mg total) by mouth 3 (three) times daily., Disp: 90 tablet, Rfl: 6   nitroGLYCERIN  (NITROSTAT ) 0.4 MG SL tablet, DISSOLVE 1 TABLET UNDER THE  TONGUE EVERY 5 MINUTES AS NEEDED FOR  CHEST PAIN. MAX OF 3 TABLETS IN 15 MINUTES. CALL 911 IF PAIN  PERSISTS., Disp: 100 tablet, Rfl: 3   nystatin  (MYCOSTATIN /NYSTOP ) powder, Apply 1 Application topically 3 (three) times daily., Disp: 15 g, Rfl: 0   pantoprazole  (PROTONIX ) 40 MG tablet, TAKE ONE (1) TABLET BY MOUTH EVERY DAY, Disp: 90 tablet, Rfl: 1   potassium chloride  (KLOR-CON ) 10 MEQ tablet, Take 1 tablet (10 mEq total) by mouth 2 (two) times daily for 3 days, THEN 1 tablet (10 mEq total) daily., Disp: 96 tablet, Rfl: 0   pravastatin  (PRAVACHOL ) 80 MG tablet, TAKE 1 TABLET BY MOUTH DAILY, Disp: 100 tablet,  Rfl: 2   zolpidem  (AMBIEN ) 10 MG tablet, TAKE 1 TABLET BY MOUTH AT BEDTIME AS NEEDED FOR SLEEP, Disp: 30 tablet, Rfl: 3      Objective:  There were no vitals taken for this visit. {Pulm Vitals (Optional):32837}  Physical Exam   Diagnostic Review:  {Labs (Optional):32838}  HST 10/2022: AHI 7, MILD OSA  PFT 08/2017: moderate obstructive defect with mild gas transfer defect  CT Chest 12/2023: 1. Emphysematous changes and pulmonary scarring. 2. Scattered areas of subpleural interstitial thickening mainly in the lower lung zones likely chronic inflammation. No overt interstitial lung disease. 3. Scattered sub solid pulmonary nodules as detailed above. Recommend follow-up noncontrast chest CT in 4-6 months. 4. No mediastinal or hilar mass or adenopathy. 5. Advanced atherosclerotic calcifications and evidence of prior coronary artery bypass surgery.   Aortic Atherosclerosis (ICD10-I70.0) and Emphysema (ICD10-J43.9).    Assessment & Plan:   Assessment & Plan   No orders of the defined types were placed in this encounter.     No follow-ups on file.   Misty Valvo, MD

## 2024-04-01 ENCOUNTER — Ambulatory Visit: Attending: Nurse Practitioner

## 2024-04-01 ENCOUNTER — Ambulatory Visit (HOSPITAL_BASED_OUTPATIENT_CLINIC_OR_DEPARTMENT_OTHER): Admitting: Pulmonary Disease

## 2024-04-02 ENCOUNTER — Ambulatory Visit

## 2024-04-07 ENCOUNTER — Ambulatory Visit: Payer: Self-pay | Admitting: Nurse Practitioner

## 2024-04-08 ENCOUNTER — Encounter: Admitting: Orthopedic Surgery

## 2024-04-10 ENCOUNTER — Other Ambulatory Visit: Payer: Self-pay | Admitting: Licensed Clinical Social Worker

## 2024-04-10 NOTE — Patient Outreach (Signed)
 Complex Care Management   Visit Note  04/10/2024  Name:  [name] MRN: [id number] DOB: Nov 29, 1945  Situation: Referral received for Complex Care Management related to Mental/Behavioral Health diagnosis GAD. I obtained verbal consent from Patient.  Visit completed with Caregiver/Granddaughter and Patient  on the phone.   Background:   Past Medical History:  Diagnosis Date   Acute ischemic colitis 09/11/2005   Anxiety    Arthritis    Atherosclerotic vascular disease    Calcified plaque at the origin of the celiac and SMA   CHF (congestive heart failure) (HCC)    Coronary atherosclerosis of native coronary artery    Mulitvessel LVEF 50-50%, DES circ 1/11, occluded SVG to OM and SVG to diagonal , LVEF 60%   COVID-19 virus infection 11/07/2020   Depression    Diverticulosis of colon    Essential hypertension    Hypothyroidism    Mesenteric ischemia    Mitral regurgitation    Mild to moderate   Mixed hyperlipidemia    Myocardial infarction (HCC) 1990   Obesity    Orthostatic hypotension    PAD (peripheral artery disease)    PVC's (premature ventricular contractions)    Schizophrenia (HCC)    Sleep apnea    CPAP   TIA (transient ischemic attack) 11/20/2017   Tubular adenoma    Type 2 diabetes mellitus (HCC)    Vascular complications of mesenteric artery     Assessment: Patient Reported Symptoms:  Cognitive Cognitive Status: Able to follow simple commands, Alert and oriented to person, place, and time, Struggling with memory recall Cognitive/Intellectual Conditions Management [RPT]: None reported or documented in medical history or problem list   Health Maintenance Behaviors: Annual physical exam Healing Pattern: Slow Health Facilitated by: Rest, Stress management  Neurological Neurological Review of Symptoms: Weakness, Other: [place] Neurological Symptoms/Conditions [RPT]: Memory Loss Neurological Management Strategies: Adequate rest, Routine screening, Coping  strategies Neurological Self-Management Outcome: 4 (good)  HEENT HEENT Symptoms Reported: No symptoms reported HEENT Management Strategies: Routine screening HEENT Self-Management Outcome: 4 (good)    Cardiovascular Cardiovascular Symptoms Reported: Lightheadness, Dizziness Does patient have uncontrolled Hypertension?: Yes Is patient checking Blood Pressure at home?: No Patient's Recent BP reading at home: BP diary usage encouraged Cardiovascular Management Strategies: Adequate rest, Coping strategies, Medication therapy Cardiovascular Self-Management Outcome: 3 (uncertain)  Respiratory Respiratory Symptoms Reported: No symptoms reported Other Respiratory Symptoms: COPD dx Respiratory Management Strategies: Adequate rest, Routine screening Respiratory Self-Management Outcome: 4 (good)  Endocrine Endocrine Symptoms Reported: Weakness or fatigue Is patient diabetic?: Yes Is patient checking blood sugars at home?: No Endocrine Self-Management Outcome: 4 (good)  Gastrointestinal Gastrointestinal Symptoms Reported: No symptoms reported Gastrointestinal Management Strategies: Adequate rest, Coping strategies Gastrointestinal Self-Management Outcome: 4 (good)    Genitourinary Genitourinary Symptoms Reported: No symptoms reported    Integumentary Integumentary Symptoms Reported: No symptoms reported    Musculoskeletal Musculoskelatal Symptoms Reviewed: Unsteady gait, Weakness, Limited mobility Musculoskeletal Management Strategies: Coping strategies, Adequate rest Musculoskeletal Self-Management Outcome: 3 (uncertain)      Psychosocial Psychosocial Symptoms Reported: Avoiding people, places, activities, Irritability Additional Psychological Details: Patient denies BH referral, socialization resources, senior resources, neurology referral and [facility] CCM program enrollment today Behavioral Management Strategies: Adequate rest, Coping strategies, Medication therapy, Support system Behavioral  Health Self-Management Outcome: 2 (bad) Major Change/Loss/Stressor/Fears (CP): Denies Techniques to Cope with Loss/Stress/Change: Not applicable Quality of Family Relationships: helpful, involved Do you feel physically threatened by others?: No Senior Resources were successfully provided to granddaughter. Patient refused wanting socialization resources at  this time.     04/10/2024    PHQ2-9 Depression Screening   Little interest or pleasure in doing things Nearly every day  Feeling down, depressed, or hopeless More than half the days  PHQ-2 - Total Score 5  Trouble falling or staying asleep, or sleeping too much Nearly every day  Feeling tired or having little energy Nearly every day  Poor appetite or overeating  Not at all  Feeling bad about yourself - or that you are a failure or have let yourself or your family down Several days  Trouble concentrating on things, such as reading the newspaper or watching television Several days  Moving or speaking so slowly that other people could have noticed.  Or the opposite - being so fidgety or restless that you have been moving around a lot more than usual Not at all  Thoughts that you would be better off dead, or hurting yourself in some way Not at all  PHQ2-9 Total Score 13  If you checked off any problems, how difficult have these problems made it for you to do your work, take care of things at home, or get along with other people Very difficult  Depression Interventions/Treatment Patient refuses Treatment, Medication    There were no vitals filed for this visit.    Medications Reviewed Today     Reviewed by Merlynn Lyle CROME, LCSW (Social Worker) on 04/10/24 at 1429  Med List Status: <None>   Medication Order Taking? Sig Documenting Provider Last Dose Status Informant  albuterol  (VENTOLIN  HFA) 108 (90 Base) MCG/ACT inhaler [id number]  INHALE 1 PUFF EVERY 6 HOURS AS NEEDED FOR WHEEZING & SHORTNESS OF SHERIDA Tobie Suzzane MARLA, MD  Active    alfuzosin  (UROXATRAL ) 10 MG 24 hr tablet [id number]  Take 1 tablet (10 mg total) by mouth at bedtime. McKenzie, Belvie CROME, MD  Active   ALPRAZolam  (XANAX ) 0.5 MG tablet [id number]  TAKE 1 TABLET BY MOUTH TWICE DAILY AS NEEDED FOR ANXIETY Tobie Suzzane MARLA, MD  Active   aspirin  EC 81 MG tablet [id number]  Take 81 mg by mouth at bedtime. [provider]  Active Self  Blood Pressure Monitoring (OMRON 3 SERIES BP MONITOR) DEVI [id number]  Use a directed Miriam Norris, NP  Active   budesonide-glycopyrrolate-formoterol (BREZTRI  AEROSPHERE) 160-9-4.8 MCG/ACT AERO inhaler [id number]  Inhale 2 puffs into the lungs 2 (two) times daily. Tobie Suzzane MARLA, MD  Active   budesonide-glycopyrrolate-formoterol (BREZTRI  AEROSPHERE) 160-9-4.8 MCG/ACT AERO inhaler [id number]  Inhale 2 puffs into the lungs 2 (two) times daily. Tobie Suzzane MARLA, MD  Active   calcitRIOL  (ROCALTROL ) 0.25 MCG capsule [id number]  Take 1 capsule (0.25 mcg total) by mouth 2 (two) times a week. Tobie Suzzane MARLA, MD  Active   citalopram  (CELEXA ) 20 MG tablet [id number]  Take 1 tablet (20 mg total) by mouth daily. Tobie Suzzane MARLA, MD  Active   clotrimazole -betamethasone  (LOTRISONE ) cream [id number]  Apply 1 Application topically daily. Tobie Suzzane MARLA, MD  Active   ELIQUIS  5 MG TABS tablet [id number]  TAKE 1 TABLET BY MOUTH TWICE  DAILY Mealor, Augustus E, MD  Active   estradiol  (ESTRACE ) 0.1 MG/GM vaginal cream [id number]  Place 1 Applicatorful vaginally 3 (three) times a week. Sherrilee Belvie CROME, MD  Active   FARXIGA  5 MG TABS tablet [id number]  TAKE 1 TABLET BY MOUTH DAILY Debera Jayson MATSU, MD  Active   furosemide  (LASIX ) 20 MG tablet [id number]  Take 1 tablet (20 mg total) by mouth 2 (two)  times daily for 3 days, THEN 1 tablet (20 mg total) daily. Miriam Norris, NP  Active   ipratropium-albuterol  (DUONEB) 0.5-2.5 (3) MG/3ML SOLN [id number]  Take 3 mLs by nebulization 4 times daily and as needed for shortness of breath or wheezing. Tobie Suzzane POUR,  MD  Active   isosorbide  mononitrate (IMDUR ) 30 MG 24 hr tablet [id number]  Take 0.5 tablets (15 mg total) by mouth daily. (HOLDING CURRENTLY 03/13/2024) Miriam Norris, NP  Active   levothyroxine  (SYNTHROID ) 50 MCG tablet [id number]  TAKE 1 TABLET BY MOUTH DAILY  BEFORE BREAKFAST Tobie Suzzane POUR, MD  Active   metoprolol  tartrate (LOPRESSOR ) 25 MG tablet [id number]  Take 0.5 tablets (12.5 mg total) by mouth 2 (two) times daily as needed (palpitations). Miriam Norris, NP  Active   midodrine  (PROAMATINE ) 10 MG tablet [id number]  Take 1 tablet (10 mg total) by mouth 3 (three) times daily. Miriam Norris, NP  Active   nitroGLYCERIN  (NITROSTAT ) 0.4 MG SL tablet [id number]  DISSOLVE 1 TABLET UNDER THE  TONGUE EVERY 5 MINUTES AS NEEDED FOR CHEST PAIN. MAX OF 3 TABLETS IN 15 MINUTES. CALL 911 IF PAIN  PERSISTS. Debera Jayson MATSU, MD  Active   nystatin  (MYCOSTATIN /NYSTOP ) powder [id number]  Apply 1 Application topically 3 (three) times daily. Tobie Suzzane POUR, MD  Active   pantoprazole  (PROTONIX ) 40 MG tablet [id number]  TAKE ONE (1) TABLET BY MOUTH EVERY DAY Tobie Suzzane POUR, MD  Active   potassium chloride  (KLOR-CON ) 10 MEQ tablet [id number]  Take 1 tablet (10 mEq total) by mouth 2 (two) times daily for 3 days, THEN 1 tablet (10 mEq total) daily. Miriam Norris, NP  Active   pravastatin  (PRAVACHOL ) 80 MG tablet [id number]  TAKE 1 TABLET BY MOUTH DAILY Tobie Suzzane POUR, MD  Active   zolpidem  (AMBIEN ) 10 MG tablet [id number]  TAKE 1 TABLET BY MOUTH AT BEDTIME AS NEEDED FOR SLEEP Tobie Suzzane POUR, MD  Active            SDOH Interventions    Flowsheet Row Patient Outreach Telephone from 04/10/2024 in [place] [facility] Clinical Support from 08/14/2023 in [facility] Primary Care Care Coordination from 01/31/2023 in [facility] Community Care Coordination Office Visit from 09/12/2021 in [facility] Clinical Support from 02/27/2021 in [facility] Primary Care Office Visit from 10/15/2019 in [facility] Primary Care  SDOH Interventions        Food Insecurity Interventions Intervention Not Indicated Intervention Not Indicated -- -- Intervention Not Indicated --  Housing Interventions Intervention Not Indicated Intervention Not Indicated -- -- Intervention Not Indicated --  Transportation Interventions -- Intervention Not Indicated -- -- Intervention Not Indicated --  Utilities Interventions -- Intervention Not Indicated -- -- -- --  Alcohol Usage Interventions -- Intervention Not Indicated (Score <7) -- -- -- --  Depression Interventions/Treatment  Patient refuses Treatment, Medication -- Patient refuses Treatment, Medication Currently on Treatment -- Currently on Treatment  Financial Strain Interventions Intervention Not Indicated Intervention Not Indicated -- -- Intervention Not Indicated --  Physical Activity Interventions -- Intervention Not Indicated -- -- Intervention Not Indicated --  Stress Interventions -- Intervention Not Indicated -- -- Intervention Not Indicated --  Social Connections Interventions Patient Declined, Community Resources Provided Intervention Not Indicated -- -- Intervention Not Indicated --  Tobacco Interventions -- -- -- -- Intervention Not Indicated --      04/10/2024    2:42 PM 11/20/2023    9:50 AM  07/18/2023    1:35 PM 03/20/2023    1:34 PM  GAD 7 : Generalized Anxiety Score  Nervous, Anxious, on Edge 1 0 0 3  Control/stop worrying 1 0 0 3  Worry too much - different things 0 0 0 3  Trouble relaxing 0 0 0 3  Restless 0 0 0 3  Easily annoyed or irritable 2 0 0 3  Afraid - awful might happen 2 0 0 1  Total GAD 7 Score 6 0 0 19  Anxiety Difficulty Somewhat difficult Not difficult at all Not difficult at all Very difficult   Recommendation:   PCP Follow-up Continue Current Plan of Care  Follow Up Plan:   Closing From:  Complex Care Management- Patient refused enrollment in CCM  program but agreed to contact PCP or this [facility] LCSW directly if she changes her mind in the future.  Lyle Rung, BSW, MSW, LCSW Licensed Clinical Social Worker [employer]   [facility] [email or web address] Direct Dial: [phone]

## 2024-04-10 NOTE — Patient Instructions (Signed)
 Visit Information  Thank you for taking time to visit with me today. Please don't hesitate to contact me if you change your mind and desire CCM program involvement.   Please call the Suicide and Crisis Lifeline: 988 call the [place]  National Suicide Prevention Lifeline: 209-432-2858 or TTY: 9516163050 TTY (770)423-3464) to talk to a trained counselor call 1-800-273-TALK (toll free, 24 hour hotline) go to [facility] [address] [place] [phone]) call the [facility] Crisis Line: [phone] call 911 if you are experiencing a Mental Health or Behavioral Health Crisis or need someone to talk to.  Patient and caregiver verbalized understanding of case closure and visit instructions communicated during this visit  Lyle Rung, BSW, MSW, LCSW Licensed Clinical Social Worker [employer]   [facility] [email or web address] Direct Dial: [phone]

## 2024-04-15 ENCOUNTER — Ambulatory Visit

## 2024-04-15 ENCOUNTER — Telehealth: Payer: Self-pay | Admitting: Cardiology

## 2024-04-15 NOTE — Telephone Encounter (Signed)
Unable to leave message v/m full

## 2024-04-15 NOTE — Telephone Encounter (Signed)
 Granddaughter Lenis) canceled Nurse Visit for today (12/17) and wants a call back to reschedule.

## 2024-04-17 NOTE — Telephone Encounter (Signed)
 Spoke with Pt's granddaughter who states that pt is unable to do morning nurse visits d/t the cold. Offered Monday at 8:45, but pt unable to make appt. Granddaughter asking if we can just follow up at upcoming visit on 04/29/24 instead. Please advise.

## 2024-04-17 NOTE — Telephone Encounter (Signed)
 Pt daughter returning call . Please advise

## 2024-04-17 NOTE — Telephone Encounter (Signed)
 Called to notify granddaughter of E.Peck's response. No answer. Mailbox full.

## 2024-04-28 ENCOUNTER — Telehealth: Admitting: *Deleted

## 2024-04-29 ENCOUNTER — Ambulatory Visit: Admitting: Nurse Practitioner

## 2024-04-29 ENCOUNTER — Ambulatory Visit

## 2024-05-06 NOTE — Progress Notes (Signed)
 "  Established Patient Pulmonology Office Visit   Subjective:  Patient ID: [name], female    DOB: Sep 02, 1945  MRN: [id number]  CC:  Chief Complaint  Patient presents with   COPD   Cough    Shob / stated she has emphysema  CT chest in chart  Osa on cpap      HPI  79 y/o female with hx of atrial fibrillation, HTN, DM, Hypothyroidism, GERD, CKD, CAD, HFrEF, morbid obesity, OSA, COPD, and chronic respiratory failure who is here for follow up.  Last seen in our office 11/14/2022, recommended for HST to evaluate sleep apnea. Doing well on Stiolto for COPD. Sleep study showed mild OSA. Patient ordered for autoPAP 5-15 cm H2O.  Discussed the use of AI scribe software for clinical note transcription with the patient, who gave verbal consent to proceed.  History of Present Illness [name] is an 79 year old female with COPD who presents with worsening respiratory symptoms and difficulty using her CPAP machine.  She has experienced worsening respiratory symptoms, including increased coughing and wheezing since May or June of last year. Frequent coughing and expectoration of phlegm were initially thought to be bronchitis. She has persistent shortness of breath, which she attributes to her history of heart surgery. She experiences difficulty breathing when lying flat and occasional leg swelling, which she manages with a diuretic every other day.  She reports having a breathing test in 2019 and was told she has COPD. She uses a Breztri  inhaler, two puffs in the morning and evening, and occasionally uses albuterol , about five or six times a week, mainly when away from home. She also has a nebulizer but reports it is not functioning properly. She uses Duoneb in the nebulizer, typically twice a day when it was working.  She received a CPAP machine last fall after a two-year delay but finds it uncomfortable and drying, even with a humidifier. She prefers using oxygen  at night, which  she feels helps her breathing more than the CPAP. She has both a full-face mask and a nasal mask for the CPAP, but neither is comfortable for her.  She experienced significant weight loss last summer, dropping from 230 pounds to 200 pounds, but has since stabilized around 220-225 pounds. She occasionally experiences bloating and checks her weight when this occurs. She does not regularly monitor her oxygen  levels at home but recalls it dropping into the 80s at times.  She has a long history of smoking, starting at age 57, and currently smokes less than a pack every two to three days. She has attempted to quit multiple times but struggles with anxiety and depression, which she feels are exacerbated by quitting. She has not used nicotine replacement therapies but has tried Wellbutrin  in the past without success.  Her family history includes a sister who passed away from COPD last year. She is concerned about her own condition worsening and the impact of smoking on her health.     11/14/2022   11:00 AM  Results of the Epworth flowsheet  Sitting and reading 3  Watching TV 3  Sitting, inactive in a public place (e.g. a theatre or a meeting) 1  As a passenger in a car for an hour without a break 0  Lying down to rest in the afternoon when circumstances permit 3  Sitting and talking to someone 0  Sitting quietly after a lunch without alcohol 0  In a car, while stopped for a few minutes  in traffic 0  Total score 10   CAT > 10, MMRC > 2, no recurrent exacerbations.  ROS    Current Medications[1]      Objective:  BP 123/86   Pulse 75   Ht 5' 3 (1.6 m)   Wt 225 lb (102.1 kg)   SpO2 97% Comment: ra  BMI 39.86 kg/m  Wt Readings from Last 3 Encounters:  05/07/24 225 lb (102.1 kg)  03/19/24 226 lb 3.2 oz (102.6 kg)  03/13/24 223 lb 3.2 oz (101.2 kg)   BMI Readings from Last 3 Encounters:  05/07/24 39.86 kg/m  03/19/24 40.07 kg/m  03/13/24 39.54 kg/m   SpO2 Readings from Last 3  Encounters:  05/07/24 97%  03/19/24 93%  03/13/24 97%    Physical Exam General: NAD, alert, WD, WN Eyes: PERRL, no scleral icterus ENMT: oropharynx clear, good dentition, no oral lesions, mallampati score IV  Skin: warm, intact, no rashes Neck: JVD flat, ROM and lymph node assessment normal CV: RRR, no MRG, nl S1 and S2, no peripheral edema Resp: scattered wheezes bilaterally, prolonged expiratory phase, no clubbing Neuro: Awake alert oriented to person place time and situation  Diagnostic Review:  Last CBC Lab Results  Component Value Date   WBC 5.5 11/20/2023   HGB 13.2 11/20/2023   HCT 39.9 11/20/2023   MCV 101 (H) 11/20/2023   MCH 33.5 (H) 11/20/2023   RDW 13.0 11/20/2023   PLT 167 11/20/2023   Last metabolic panel Lab Results  Component Value Date   GLUCOSE 147 (H) 01/01/2024   NA 143 01/01/2024   K 5.0 01/01/2024   CL 105 01/01/2024   CO2 21 01/01/2024   BUN 16 01/01/2024   CREATININE 1.23 (H) 01/01/2024   EGFR 45 (L) 01/01/2024   CALCIUM 9.3 01/01/2024   PROT 6.4 11/20/2023   ALBUMIN 3.7 (L) 11/20/2023   LABGLOB 2.7 11/20/2023   AGRATIO 1.4 09/12/2021   BILITOT 0.5 11/20/2023   ALKPHOS 89 11/20/2023   AST 15 11/20/2023   ALT 11 11/20/2023   ANIONGAP 9 03/13/2021   HST 12/12/2022 --> mild OSA with AHI 7  PFT 2019: mild obstructive lung disease, mild diffusion impairment  CT Chest 12/2023: IMPRESSION: 1. Emphysematous changes and pulmonary scarring. 2. Scattered areas of subpleural interstitial thickening mainly in the lower lung zones likely chronic inflammation. No overt interstitial lung disease. 3. Scattered sub solid pulmonary nodules as detailed above. Recommend follow-up noncontrast chest CT in 4-6 months. 4. No mediastinal or hilar mass or adenopathy. 5. Advanced atherosclerotic calcifications and evidence of prior coronary artery bypass surgery.    Assessment & Plan:   Assessment & Plan OSA (obstructive sleep apnea) Obstructive sleep  apnea Managed with CPAP, but usage inconsistent due to discomfort and dryness. Oxygen  therapy preferred at night. Patient is not compliant. - Encouraged consistent CPAP use for at least 4 hours per night to meet insurance requirements. - Advised use of nasal pillows to improve comfort. - Advised to consider using CPAP during daytime naps to increase usage. Multiple lung nodules Multiple pulmonary nodules, 4 mm in LLL, 6 mm in RLL, subpleural GGOs in LLL x 2. - Repeat CT chest in 6 months from last one. DUE late March, early April 2026. Chronic obstructive pulmonary disease, unspecified COPD type (HCC) Chronic obstructive pulmonary disease with emphysema Mild to moderate COPD with emphysema confirmed by spirometry and CT. Symptoms include chronic cough, wheezing, and dyspnea, exacerbated by smoking. Breztri  inhaler improved breathing but not mucus production. Weight gain  and lack of exercise may contribute to symptoms. Class B, Grade I. - Continue Breztri  inhaler, 2 puffs morning and evening, rinse mouth after use. - Use albuterol  nebulizer treatments twice daily to loosen mucus. - Consider pulmonary rehab for supervised exercise and COPD management education. - Encouraged smoking cessation to improve symptoms and reduce mucus production. - Encouraged weight loss through dietary changes and increased physical activity. Cigarette nicotine dependence without complication Nicotine dependence, cigarettes Long-standing nicotine dependence since age 32. Previous Wellbutrin  attempts unsuccessful. Smoking contributes to COPD symptoms and mucus production. - Recommended combination therapy with nicotine patches and gum to aid smoking cessation. - Encouraged complete smoking cessation to improve respiratory health and reduce mucus production.  Smoking/Tobacco Cessation Counseling [name] is a current user of tobacco or nicotine products. She is not ready to quit at this time. Counseling  provided today addressed the risks of continued use and the benefits of cessation. Discussed tobacco/nicotine use history, readiness to quit, and evidence-based treatment options including behavioral strategies, support resources, and pharmacologic therapies. Provided encouragement and educational materials on steps and resources to quit smoking. Patient questions were addressed, and follow-up recommended for continued support. Total time spent on counseling: 5 minutes.   Orders Placed This Encounter  Procedures   CT Chest Wo Contrast   I spent 30 minutes reviewing patient's chart including prior consultant notes, imaging, and PFTs as well as face-to-face with the patient, over half in discussion of the diagnosis and the importance of compliance with the treatment plan.  Return in about 4 months (around 09/04/2024).   Quaran Kedzierski, MD     [1]  Current Outpatient Medications:    albuterol  (VENTOLIN  HFA) 108 (90 Base) MCG/ACT inhaler, INHALE 1 PUFF EVERY 6 HOURS AS NEEDED FOR WHEEZING & SHORTNESS OF BREATH, Disp: 8.5 g, Rfl: 0   alfuzosin  (UROXATRAL ) 10 MG 24 hr tablet, Take 1 tablet (10 mg total) by mouth at bedtime., Disp: 30 tablet, Rfl: 11   aspirin  EC 81 MG tablet, Take 81 mg by mouth at bedtime., Disp: , Rfl:    Blood Pressure Monitoring (OMRON 3 SERIES BP MONITOR) DEVI, Use a directed, Disp: 1 each, Rfl: 1   budesonide-glycopyrrolate-formoterol (BREZTRI  AEROSPHERE) 160-9-4.8 MCG/ACT AERO inhaler, Inhale 2 puffs into the lungs 2 (two) times daily., Disp: 10.7 g, Rfl: 11   budesonide-glycopyrrolate-formoterol (BREZTRI  AEROSPHERE) 160-9-4.8 MCG/ACT AERO inhaler, Inhale 2 puffs into the lungs 2 (two) times daily., Disp: , Rfl:    calcitRIOL  (ROCALTROL ) 0.25 MCG capsule, Take 1 capsule (0.25 mcg total) by mouth 2 (two) times a week., Disp: 30 capsule, Rfl: 0   clotrimazole -betamethasone  (LOTRISONE ) cream, Apply 1 Application topically daily., Disp: 30 g, Rfl: 0   ELIQUIS  5 MG TABS tablet,  TAKE 1 TABLET BY MOUTH TWICE  DAILY, Disp: 200 tablet, Rfl: 2   estradiol  (ESTRACE ) 0.1 MG/GM vaginal cream, Place 1 Applicatorful vaginally 3 (three) times a week., Disp: 42.5 g, Rfl: 12   FARXIGA  5 MG TABS tablet, TAKE 1 TABLET BY MOUTH DAILY, Disp: 100 tablet, Rfl: 2   furosemide  (LASIX ) 20 MG tablet, Take 1 tablet (20 mg total) by mouth 2 (two) times daily for 3 days, THEN 1 tablet (20 mg total) daily., Disp: 96 tablet, Rfl: 0   ipratropium-albuterol  (DUONEB) 0.5-2.5 (3) MG/3ML SOLN, Take 3 mLs by nebulization 4 times daily and as needed for shortness of breath or wheezing., Disp: 450 mL, Rfl: 11   isosorbide  mononitrate (IMDUR ) 30 MG 24 hr tablet, Take 0.5 tablets (15  mg total) by mouth daily. (HOLDING CURRENTLY 03/13/2024), Disp: , Rfl:    levothyroxine  (SYNTHROID ) 50 MCG tablet, TAKE 1 TABLET BY MOUTH DAILY  BEFORE BREAKFAST, Disp: 100 tablet, Rfl: 2   metoprolol  tartrate (LOPRESSOR ) 25 MG tablet, Take 0.5 tablets (12.5 mg total) by mouth 2 (two) times daily as needed (palpitations)., Disp: 60 tablet, Rfl: 2   midodrine  (PROAMATINE ) 10 MG tablet, Take 1 tablet (10 mg total) by mouth 3 (three) times daily., Disp: 90 tablet, Rfl: 6   nitroGLYCERIN  (NITROSTAT ) 0.4 MG SL tablet, DISSOLVE 1 TABLET UNDER THE  TONGUE EVERY 5 MINUTES AS NEEDED FOR CHEST PAIN. MAX OF 3 TABLETS IN 15 MINUTES. CALL 911 IF PAIN  PERSISTS., Disp: 100 tablet, Rfl: 3   nystatin  (MYCOSTATIN /NYSTOP ) powder, Apply 1 Application topically 3 (three) times daily., Disp: 15 g, Rfl: 0   pantoprazole  (PROTONIX ) 40 MG tablet, TAKE ONE (1) TABLET BY MOUTH EVERY DAY, Disp: 90 tablet, Rfl: 1   potassium chloride  (KLOR-CON ) 10 MEQ tablet, Take 1 tablet (10 mEq total) by mouth 2 (two) times daily for 3 days, THEN 1 tablet (10 mEq total) daily., Disp: 96 tablet, Rfl: 0   pravastatin  (PRAVACHOL ) 80 MG tablet, TAKE 1 TABLET BY MOUTH DAILY, Disp: 100 tablet, Rfl: 2   zolpidem  (AMBIEN ) 10 MG tablet, TAKE 1 TABLET BY MOUTH AT BEDTIME AS NEEDED FOR  SLEEP, Disp: 30 tablet, Rfl: 3   ALPRAZolam  (XANAX ) 0.5 MG tablet, TAKE 1 TABLET BY MOUTH TWICE DAILY AS NEEDED FOR ANXIETY, Disp: 60 tablet, Rfl: 2   citalopram  (CELEXA ) 20 MG tablet, TAKE ONE (1) TABLET BY MOUTH EVERY DAY, Disp: 90 tablet, Rfl: 1  "

## 2024-05-07 ENCOUNTER — Ambulatory Visit: Admitting: Pulmonary Disease

## 2024-05-07 ENCOUNTER — Telehealth: Payer: Self-pay

## 2024-05-07 ENCOUNTER — Other Ambulatory Visit: Payer: Self-pay | Admitting: Cardiology

## 2024-05-07 ENCOUNTER — Encounter: Payer: Self-pay | Admitting: Pulmonary Disease

## 2024-05-07 ENCOUNTER — Other Ambulatory Visit: Payer: Self-pay | Admitting: Internal Medicine

## 2024-05-07 VITALS — BP 123/86 | HR 75 | Ht 63.0 in | Wt 225.0 lb

## 2024-05-07 DIAGNOSIS — R0683 Snoring: Secondary | ICD-10-CM

## 2024-05-07 DIAGNOSIS — G4733 Obstructive sleep apnea (adult) (pediatric): Secondary | ICD-10-CM

## 2024-05-07 DIAGNOSIS — F331 Major depressive disorder, recurrent, moderate: Secondary | ICD-10-CM

## 2024-05-07 DIAGNOSIS — Z8669 Personal history of other diseases of the nervous system and sense organs: Secondary | ICD-10-CM

## 2024-05-07 DIAGNOSIS — R918 Other nonspecific abnormal finding of lung field: Secondary | ICD-10-CM | POA: Diagnosis not present

## 2024-05-07 DIAGNOSIS — F17210 Nicotine dependence, cigarettes, uncomplicated: Secondary | ICD-10-CM | POA: Diagnosis not present

## 2024-05-07 DIAGNOSIS — I4891 Unspecified atrial fibrillation: Secondary | ICD-10-CM

## 2024-05-07 DIAGNOSIS — J449 Chronic obstructive pulmonary disease, unspecified: Secondary | ICD-10-CM

## 2024-05-07 DIAGNOSIS — F411 Generalized anxiety disorder: Secondary | ICD-10-CM

## 2024-05-07 NOTE — Patient Instructions (Signed)
" °  VISIT SUMMARY: You visited us  today due to worsening respiratory symptoms and difficulty using your CPAP machine. We discussed your COPD management, obstructive sleep apnea, and the importance of smoking cessation.  YOUR PLAN: CHRONIC OBSTRUCTIVE PULMONARY DISEASE (COPD) WITH EMPHYSEMA: You have mild to moderate COPD with emphysema, confirmed by tests. Your symptoms include chronic cough, wheezing, and shortness of breath, which are worsened by smoking. -Continue using the Breztri  inhaler, 2 puffs in the morning and evening. Remember to rinse your mouth after use. -Use albuterol  nebulizer treatments twice daily to help loosen mucus. -Consider enrolling in pulmonary rehab for supervised exercise and COPD management education. -Try to quit smoking to improve your symptoms and reduce mucus production. -Work on weight loss through dietary changes and increased physical activity.  OBSTRUCTIVE SLEEP APNEA: You have obstructive sleep apnea and have been using a CPAP machine, but find it uncomfortable and drying. -Try to use your CPAP machine consistently for at least 4 hours per night to meet insurance requirements. -Consider using nasal pillows to reduce dryness and improve comfort. -You can also use the CPAP during daytime naps to increase your usage.  MULTIPLE PULMONARY NODULES: You have multiple pulmonary nodules that were noted on a CT scan. There have been no significant changes, but follow-up imaging is necessary. -We will order a follow-up CT scan in April to monitor the pulmonary nodules.  NICOTINE DEPENDENCE, CIGARETTES: You have a long-standing nicotine dependence, which contributes to your COPD symptoms and mucus production. -We recommend using a combination of nicotine patches and gum to help you quit smoking. -It is important to completely quit smoking to improve your respiratory health and reduce mucus production.  Contains text generated by [employer] "

## 2024-05-07 NOTE — Assessment & Plan Note (Signed)
 Chronic obstructive pulmonary disease with emphysema Mild to moderate COPD with emphysema confirmed by spirometry and CT. Symptoms include chronic cough, wheezing, and dyspnea, exacerbated by smoking. Breztri  inhaler improved breathing but not mucus production. Weight gain and lack of exercise may contribute to symptoms. Class B, Grade I. - Continue Breztri  inhaler, 2 puffs morning and evening, rinse mouth after use. - Use albuterol  nebulizer treatments twice daily to loosen mucus. - Consider pulmonary rehab for supervised exercise and COPD management education. - Encouraged smoking cessation to improve symptoms and reduce mucus production. - Encouraged weight loss through dietary changes and increased physical activity.

## 2024-05-07 NOTE — Assessment & Plan Note (Signed)
 Obstructive sleep apnea Managed with CPAP, but usage inconsistent due to discomfort and dryness. Oxygen  therapy preferred at night. Patient is not compliant. - Encouraged consistent CPAP use for at least 4 hours per night to meet insurance requirements. - Advised use of nasal pillows to improve comfort. - Advised to consider using CPAP during daytime naps to increase usage.

## 2024-05-07 NOTE — Telephone Encounter (Signed)
 Copied from CRM #[id number]. Topic: Clinical - Order For Equipment >> May 07, 2024  9:37 AM Isabell A wrote: Reason for CRM: Redell from [place] returning phone call from [place] in regard to patients CPAP - states [facility] did not provide patients CPAP   Callback number: [phone]  Pt does not wear cpap from last documents

## 2024-05-11 ENCOUNTER — Ambulatory Visit: Payer: Self-pay | Admitting: Internal Medicine

## 2024-05-11 DIAGNOSIS — I25118 Atherosclerotic heart disease of native coronary artery with other forms of angina pectoris: Secondary | ICD-10-CM

## 2024-05-11 DIAGNOSIS — E039 Hypothyroidism, unspecified: Secondary | ICD-10-CM

## 2024-05-11 DIAGNOSIS — I739 Peripheral vascular disease, unspecified: Secondary | ICD-10-CM

## 2024-05-11 MED ORDER — ISOSORBIDE MONONITRATE ER 30 MG PO TB24: 15.0000 mg | ORAL_TABLET | Freq: Every day | ORAL | 0 refills | Status: AC

## 2024-05-11 MED ORDER — NITROGLYCERIN 0.4 MG SL SUBL: 0.4000 mg | SUBLINGUAL_TABLET | SUBLINGUAL | 3 refills | Status: AC | PRN

## 2024-05-11 MED ORDER — LEVOTHYROXINE SODIUM 50 MCG PO TABS: 50.0000 ug | ORAL_TABLET | Freq: Every day | ORAL | 2 refills | Status: AC

## 2024-05-11 MED ORDER — PRAVASTATIN SODIUM 80 MG PO TABS: 80.0000 mg | ORAL_TABLET | Freq: Every day | ORAL | 2 refills | Status: AC

## 2024-05-11 NOTE — Telephone Encounter (Signed)
 FYI Only or Action Required?: Action required by provider: medication refill request.  Patient was last seen in primary care on 03/19/2024 by Misty Suzzane POUR, MD.  Called Nurse Triage reporting Medication Refill.  Triage Disposition: Call PCP Now   Patient/caregiver understands and will follow disposition?: Yes                Message from [facility] sent at 05/11/2024 11:20 AM EST  Summary: apixaban  (ELIQUIS ) 5 MG TABS tablet Refill   Reason for Triage: apixaban  (ELIQUIS ) 5 MG TABS tablet  [facility] [name], [name] - [address] [place] [address] Phone: [phone] Fax: [phone] Hours: Not open 24 hours   Patient's grand daughter called in and stated that she knows the Eliquis  is through another provider but patient insist on granddaughter calling primary care Dr. Tobie for the refill and patient has been without it for a week.  [phone]  [name]         Reason for Disposition  [1] Pharmacy calling with prescription questions AND [2] triager unable to answer question  Answer Assessment - Initial Assessment Questions [name], on the phone for triage. Patient is out of Eliquis  for the past week. Insurance won't approve the refill from cardiology due to refill too soon. She has tried to call cardiology multiple times today and no answer. Asking if PCP can help with medications. Issues coming from switching from [facility] to [facility], insurance not covering refills and new pharmacy saying they need orders. Granddaughter reporting the patient is verbally abusive towards family, cursing and yelling at them and that they try to avoid her due to how she treats them, wants Dr Misty to know we don't abuse her and they patient refuses help from family at times.  1. DRUG NAME: What medicine do you need to have refilled?     Eliquis  already has current refills and order on file from cardiology (placed  05/07/24). Need new orders for nitroglycerin  and isosorbide  mononitrate which have orders but they were sent to [facility]. Pravastatin  and levothyroxine  refills.   2. REFILLS REMAINING: How many refills are remaining? Notes: The label on the medicine or pill bottle will show how many refills are remaining. If there are no refills remaining, then a renewal may be needed.     N/A. 3. EXPIRATION DATE: What is the expiration date? Note: The label states when the prescription will expire, and thus can no longer be refilled.)     N/A. 4. PRESCRIBER: Who prescribed it? Note: The prescribing doctor or group is responsible for refill approvals..     Cardiology refills Eliquis , nitroglycerin  and isosorbide  mononitrate. Dr Misty refills pravastatin  and levothyroxine .  5. PHARMACY: Have you contacted your pharmacy (drugstore)? Note: Some pharmacies will contact the doctor (or NP/PA).      Yes. Pharmacy told them they can't refill the medication because it says she just refilled back in November. Called [facility] and spoke with pharmacist. Family was advised that they needed to call their insurance company and that the other option is to pay out of pocket.  6. SYMPTOMS: Do you have any symptoms?     No new SOB, chest pain, unilateral numbness or weakness, unsteady gait, changes in speech or vision, leg pain/swelling/redness  Protocols used: Medication Refill and Renewal Call-A-AH

## 2024-05-12 ENCOUNTER — Telehealth: Payer: Self-pay

## 2024-05-12 ENCOUNTER — Telehealth: Payer: Self-pay | Admitting: Pharmacy Technician

## 2024-05-12 ENCOUNTER — Other Ambulatory Visit (HOSPITAL_COMMUNITY): Payer: Self-pay

## 2024-05-12 NOTE — Telephone Encounter (Signed)
 Patient granddaughter Suzen picked up meds.

## 2024-05-12 NOTE — Telephone Encounter (Signed)
 Copied from CRM #[id number]. Topic: Clinical - Medical Advice >> May 12, 2024  9:35 AM Treva T wrote: Reason for CRM: Pt granddaughter, Suzen, calling back, states she called on yesterday in regards to pt being out of Eliquis  medication for over a week.  Caller is aware prescribing provider is cardiologist, however pharmacy is denying refill as states it is too early. Also states cardiology is not answering their calls.   Granddaughter states she is calling primary care for medical directives/advice.  She did not receive a response from office on yesterday, and would like to know what to do.  Also reports pt states pt is upset.  Called and spoke to front office, Brittany, reports nurse unavailable at time of call.  Pt granddaughter requesting return call as soon as possible.   Aware of same day call back.  Can be reached at [phone]

## 2024-05-12 NOTE — Telephone Encounter (Signed)
 Refill too soon until 05/25/24  Last filled 03/11/24

## 2024-05-12 NOTE — Telephone Encounter (Signed)
 Notified grand-daughter - advised on when she is able to get medication refilled.  Suzen research scientist (physical sciences)) tries to help her with her medications and will fill up he supply, but then she will go behind and take things out that she does not want to take.  She has difficulty helping her manage the situation due to patient's unwillingness to cooperate with her.  Suggested she pick up her samples as planned.  Also, look into bubble packing her meds to see if that would help make things easier.  She verbalized understanding.

## 2024-05-12 NOTE — Telephone Encounter (Signed)
 Patient calling the office for samples of medication:   1.  What medication and dosage are you requesting samples for? ELIQUIS  5 MG  2.  Are you currently out of this medication?  No, pharmacy is telling the patient that they can't refill until 02-19 .  Please call granddaughter [name]

## 2024-05-12 NOTE — Telephone Encounter (Signed)
 Patient daughter in law Newell came by to pick up medication, she is not on dpr to release information to, meds were not given to her.

## 2024-05-19 ENCOUNTER — Other Ambulatory Visit: Payer: Self-pay

## 2024-05-19 ENCOUNTER — Other Ambulatory Visit: Payer: Self-pay | Admitting: Cardiology

## 2024-05-19 NOTE — Telephone Encounter (Signed)
 SABRA

## 2024-05-21 MED ORDER — ALFUZOSIN HCL ER 10 MG PO TB24: 10.0000 mg | ORAL_TABLET | Freq: Every day | ORAL | 11 refills | Status: AC

## 2024-06-10 ENCOUNTER — Ambulatory Visit: Admitting: Cardiology

## 2024-07-16 ENCOUNTER — Ambulatory Visit: Admitting: Internal Medicine

## 2024-08-07 ENCOUNTER — Other Ambulatory Visit (HOSPITAL_COMMUNITY)

## 2024-08-19 ENCOUNTER — Ambulatory Visit
